# Patient Record
Sex: Female | Born: 1935
Health system: Southern US, Community
[De-identification: ages and names within clinical notes are randomized; demographics above are authoritative.]

## PROBLEM LIST (undated history)

## (undated) DIAGNOSIS — E7800 Pure hypercholesterolemia, unspecified: Secondary | ICD-10-CM

## (undated) DIAGNOSIS — F419 Anxiety disorder, unspecified: Secondary | ICD-10-CM

## (undated) DIAGNOSIS — F32A Depression, unspecified: Secondary | ICD-10-CM

## (undated) DIAGNOSIS — I10 Essential (primary) hypertension: Secondary | ICD-10-CM

## (undated) DIAGNOSIS — I739 Peripheral vascular disease, unspecified: Secondary | ICD-10-CM

## (undated) DIAGNOSIS — I639 Cerebral infarction, unspecified: Secondary | ICD-10-CM

## (undated) DIAGNOSIS — Z8673 Personal history of transient ischemic attack (TIA), and cerebral infarction without residual deficits: Secondary | ICD-10-CM

## (undated) DIAGNOSIS — D649 Anemia, unspecified: Secondary | ICD-10-CM

## (undated) DIAGNOSIS — C801 Malignant (primary) neoplasm, unspecified: Secondary | ICD-10-CM

## (undated) DIAGNOSIS — I5042 Chronic combined systolic (congestive) and diastolic (congestive) heart failure: Secondary | ICD-10-CM

## (undated) HISTORY — DX: Peripheral vascular disease, unspecified: I73.9

## (undated) HISTORY — DX: Malignant (primary) neoplasm, unspecified: C80.1

## (undated) HISTORY — PX: TOENAIL EXCISION: SUR558

## (undated) HISTORY — PX: MASTECTOMY: SHX3

## (undated) HISTORY — PX: ABDOMINAL HYSTERECTOMY: SHX81

## (undated) HISTORY — PX: FRACTURE SURGERY: SHX138

## (undated) HISTORY — PX: HUMERUS FRACTURE SURGERY: SHX670

## (undated) HISTORY — DX: Anemia, unspecified: D64.9

## (undated) HISTORY — DX: Personal history of transient ischemic attack (TIA), and cerebral infarction without residual deficits: Z86.73

## (undated) HISTORY — DX: Chronic combined systolic (congestive) and diastolic (congestive) heart failure: I50.42

---
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
## 1975-08-25 HISTORY — PX: NASAL SEPTUM SURGERY: SHX37

## 1975-08-25 HISTORY — PX: CALDWELL LUC SINUSOTOMY: SHX6292

## 1997-11-22 ENCOUNTER — Encounter: Admission: RE | Admit: 1997-11-22 | Discharge: 1998-02-20 | Payer: Self-pay

## 1999-06-02 ENCOUNTER — Ambulatory Visit (HOSPITAL_BASED_OUTPATIENT_CLINIC_OR_DEPARTMENT_OTHER): Admission: RE | Admit: 1999-06-02 | Discharge: 1999-06-02 | Payer: Self-pay | Admitting: Otolaryngology

## 1999-11-19 ENCOUNTER — Ambulatory Visit (HOSPITAL_COMMUNITY): Admission: RE | Admit: 1999-11-19 | Discharge: 1999-11-19 | Payer: Self-pay | Admitting: Gastroenterology

## 2001-08-24 HISTORY — PX: CHOLECYSTECTOMY: SHX55

## 2001-10-31 ENCOUNTER — Inpatient Hospital Stay (HOSPITAL_COMMUNITY): Admission: EM | Admit: 2001-10-31 | Discharge: 2001-11-05 | Payer: Self-pay | Admitting: Emergency Medicine

## 2001-11-03 ENCOUNTER — Encounter: Payer: Self-pay | Admitting: Internal Medicine

## 2001-12-20 ENCOUNTER — Ambulatory Visit (HOSPITAL_COMMUNITY): Admission: RE | Admit: 2001-12-20 | Discharge: 2001-12-20 | Payer: Self-pay | Admitting: General Surgery

## 2001-12-20 ENCOUNTER — Encounter: Payer: Self-pay | Admitting: General Surgery

## 2002-02-13 ENCOUNTER — Encounter: Payer: Self-pay | Admitting: General Surgery

## 2002-02-13 ENCOUNTER — Observation Stay (HOSPITAL_COMMUNITY): Admission: RE | Admit: 2002-02-13 | Discharge: 2002-02-14 | Payer: Self-pay | Admitting: General Surgery

## 2002-10-25 ENCOUNTER — Other Ambulatory Visit: Admission: RE | Admit: 2002-10-25 | Discharge: 2002-10-25 | Payer: Self-pay | Admitting: Family Medicine

## 2003-02-13 ENCOUNTER — Other Ambulatory Visit (HOSPITAL_COMMUNITY): Admission: RE | Admit: 2003-02-13 | Discharge: 2003-02-23 | Payer: Self-pay | Admitting: Psychiatry

## 2003-07-03 ENCOUNTER — Encounter: Admission: RE | Admit: 2003-07-03 | Discharge: 2003-07-03 | Payer: Self-pay | Admitting: *Deleted

## 2004-03-05 ENCOUNTER — Encounter: Admission: RE | Admit: 2004-03-05 | Discharge: 2004-03-05 | Payer: Self-pay | Admitting: Family Medicine

## 2004-09-24 ENCOUNTER — Encounter: Admission: RE | Admit: 2004-09-24 | Discharge: 2004-09-24 | Payer: Self-pay | Admitting: General Surgery

## 2004-09-26 ENCOUNTER — Ambulatory Visit (HOSPITAL_COMMUNITY): Admission: RE | Admit: 2004-09-26 | Discharge: 2004-09-26 | Payer: Self-pay | Admitting: General Surgery

## 2004-09-26 ENCOUNTER — Ambulatory Visit (HOSPITAL_BASED_OUTPATIENT_CLINIC_OR_DEPARTMENT_OTHER): Admission: RE | Admit: 2004-09-26 | Discharge: 2004-09-26 | Payer: Self-pay | Admitting: General Surgery

## 2006-08-27 ENCOUNTER — Encounter: Admission: RE | Admit: 2006-08-27 | Discharge: 2006-08-27 | Payer: Self-pay | Admitting: Family Medicine

## 2006-10-13 ENCOUNTER — Inpatient Hospital Stay (HOSPITAL_COMMUNITY): Admission: EM | Admit: 2006-10-13 | Discharge: 2006-10-14 | Payer: Self-pay | Admitting: Emergency Medicine

## 2007-01-31 ENCOUNTER — Other Ambulatory Visit: Admission: RE | Admit: 2007-01-31 | Discharge: 2007-01-31 | Payer: Self-pay | Admitting: Family Medicine

## 2007-08-25 HISTORY — PX: EYE SURGERY: SHX253

## 2011-01-09 NOTE — H&P (Signed)
NAMECATERIN, Helen [name] NO.:  [id number]   MEDICAL RECORD NO.:  [id number]          PATIENT TYPE:  INP   LOCATION:  5024                         FACILITY:  [facility]   PHYSICIAN:  Michelene Gardener, MD    DATE OF BIRTH:  1929/11/12   DATE OF ADMISSION:  10/13/2006  DATE OF DISCHARGE:                              HISTORY & PHYSICAL   PRIMARY CARE PHYSICIAN:  Lorre Nick .   CHIEF COMPLAINT:  The patient fell down and has pain in her elbow.   HISTORY OF PRESENT ILLNESS:  This is a 77 year old Caucasian female with  past medical history of multiple medical problems who presented with the  above mentioned complaint. This happened last night when the patient  tripped and fell on her left side. She had pain and decreased mobility  of her left elbow since that time. She was seen by her primary care  physician today and x-ray was done to her elbow, that showed dislocation  plus fracture of her left elbow. So the patient was sent for further  evaluation. Primary physician spoke to the orthopedic service, who will  be seeing the patient in the ER. Currently, the patient is complaining  of pain in her left elbow and decreased mobility of her elbow joint.   PAST MEDICAL HISTORY:  1. Diabetes mellitus.  2. Hypertension.  3. Hyperlipidemia.  4. GERD.  5. Depression.  6. Anxiety.  7. Chronic pain.   PAST SURGICAL HISTORY:  The patient has multiple surgical history. She  states that she has a list of her surgeries and I will re-dictated them  when the list is available.   ALLERGIES:  SULFA.   MEDICATIONS:  1. Norvasc 2.5 mg p.o. once daily.  2. Atenolol 50 mg p.o. once daily.  3. Zetia 10 mg p.o. once daily.  4. Folic acid 1 mg p.o. once daily.  5. Nasonex 2 sprays at bedtime.  6. Multivitamin 1 tab p.o. once daily.  7. Protonix 40 mg p.o. once daily.  8. Actos 50 mg p.o. daily.  9. Zoloft 75 mg p.o. daily.  10.Simvastatin 80 mg p.o. at bedtime.  11.Xanax 0.5 mg  p.o. q.8 hours p.r.n.   OBJECTIVE:  Denies smoking, denies alcohol drinking, denies recreational  drugs.   FAMILY HISTORY:  Hypertension and diabetes.   REVIEW OF SYSTEMS:  CONSTITUTIONAL:  Positive for stability. HEENT:  Eyes, no blurred vision. ENT:  No tinnitus. RESPIRATORY:  No cough, no  wheezes. CARDIOVASCULAR:  No chest pain, no shortness of breath.  GASTROINTESTINAL:  No mass, no rebound, no diarrhea. GENITOURINARY:  No  dysuria, no hematuria. ENDOCRINE:  No polyuria, no hematuria.  HEMATOLOGY:  No bleeding. INFECTIOUS DISEASE:  No rash and no lesions .  NEUROLOGIC:  No numbness or tingling. The rest of the systems are  reviewed and they are negative.   PHYSICAL EXAMINATION:  VITAL SIGNS:  Temperature 98.2, pulse 72,  respiratory rate 18, blood pressure 135/82.  GENERAL:  This is an elderly, Caucasian female, lying down in bed. Not  in acute distress.  HEENT:  Conjunctivae showed no pallor,  no erythema. Pupils are equal,  round, and reactive to light and accommodation. There is no ptosis seen  and intact. There is no discharge or bleeding. Oral mucosa is dry and no  pharyngeal erythema.  NECK:  Supple. No JVD. No carotid bruit. No tyroid enlargement.  CARDIOVASCULAR:  S1 and S2 regular. There are no murmur, rub, or gallop.  LUNGS:  The patient is breathing between 16 to 18. There is no use of  accessory muscles. No intercostal retractions. No rales, no rhonchi, and  no wheezes.  ABDOMEN:  Soft, nontender, and nondistended.  No hepatosplenomegaly.  Bowel sounds are normal.  BACK:  mild tenderness .  EXTREMITIES:  No edema. No varicose veins. Joints, the left elbow joint  is very tender to movement with decreased motion, currently in a sling.  SKIN:  No rash and no erythema.  NEUROLOGIC:  Cranial nerves 2-12 are intact. There are no motor or  sensory deficits.   LABORATORY DATA:  WBC 9.2. Hemoglobin and hematocrit 15.0 and 38.7. MCV  85.1. Platelet count is 198,000. INR  is 1.1. Sodium 129, potassium 4.3,  chloride 94, bicarb 25, glucose 128. BUN 9, creatinine 0.63.   IMPRESSION:  1. Preoperative evaluation for displaced elbow fracture dislocation.      This patient does not have any cardiac history. This fracture      occurred because she tripped and fell down and fractured her elbow.      There is no evidence of syncope and there is no evidence of      cardiopulmonary event. Denied chest pain and denied shortness of      breath. Her EKG showed sinus rhythm with no evidence of acute      ischemia. The patient will cleared for the surgery as a moderate      risk, given her age and her co-morbidities. The patient is already      getting beta blocker, which will decrease any possible cardiac      complications.  2. Diabetes mellitus. Will continue her current medications. I will      start her on insulin sliding scale. I will also get a hemoglobin      A1c to assess her control in the last 3 months.  3. Hypertension. Will continue her current medications and will follow      her blood pressure.  4. Hyperlipidemia. Will continue her current medications.  5. Rt elbow fracture dislocation. The patient will be seen by      orthopedics and will be taken today to the OR for closed reduction.   Total assessment time is 50 minutes.      Michelene Gardener, MD  Electronically Signed     NAE/MEDQ  D:  10/13/2006  T:  10/13/2006  Job:  [id number]   cc:   Donovan Kail, M.D.

## 2011-01-09 NOTE — Discharge Summary (Signed)
Helen, [name] NO.:  [id number]   MEDICAL RECORD NO.:  [id number]          PATIENT TYPE:  INP   LOCATION:  5024                         FACILITY:  [facility]   PHYSICIAN:  Michelene Gardener, MD    DATE OF BIRTH:  1930/03/14   DATE OF ADMISSION:  10/13/2006  DATE OF DISCHARGE:  10/14/2006                               DISCHARGE SUMMARY   DISCHARGE DIAGNOSES:  1. Right elbow fracture dislocation.  2. Diabetes mellitus.  3. Hypertension.  4. Hyperlipidemia.  5. Gastroesophageal reflux disease.  6. Depression.  7. Anxiety.  8. Chronic pain.   DISCHARGE MEDICATIONS:  1. Norvasc 2.5 mg p.o. once daily.  2. Atenolol 50 mg p.o. once daily.  3. Zetia 10 mg p.o. once daily.  4. Folic acid 1 mg p.o. once daily.  5. Nasonex  two sprays at bedtime.  6. Multivitamin 1 tab p.o. once daily.  7. Protonix 40 mg p.o. once daily.  8. Actos 15 mg p.o. once daily.  9. Zoloft 75 mg p.o. once daily.  10.Simvastatin 80 mg p.o. at bedtime.  11.Xanax 0.5 p.o. q.8 h. p.r.n.   CONSULTATIONS:  Orthopedic consult by Dr. Dairl Ponder.   PROCEDURE:  Closed reduction of right elbow fracture dislocation.   FOLLOWUP:  Appointment with primary doctor in one to two weeks.   RADIOLOGY STUDIES:  1. Her right elbow x-ray showed fracture dislocation in the right      elbow.  2. Chest x-ray showed right upper lobe scarring with no acute      abnormalities.   COURSE OF HOSPITALIZATION AND MEDICAL PROBLEMS:  This is a 77 year old  female who is right hand dominant, fell one day prior to her  presentation after she lost her balance, and she hit her right elbow.  X-  rays done in her primary physician's office showed the fracture  dislocation, and the patient was sent to the ER for further evaluation.  The patient was admitted to the hospital for closed reduction, was seen  by orthopedics where closed reduction was done in the OR.  The patient  was kept overnight for observation.  She  remains stable.  Next day, she  was discharged home on the same medications taken at home, in addition  to pain medications.  The patient remained stable during her  hospitalization.  Will be followed by her primary physician in one week.   Care of this patient was 40 minutes.      Michelene Gardener, MD  Electronically Signed     NAE/MEDQ  D:  10/14/2006  T:  10/15/2006  Job:  [id number]   cc:   Lorre Nick, M.D.

## 2011-01-09 NOTE — Consult Note (Signed)
NAMETAMKIA, TEMPLES NO.:  [id number]   MEDICAL RECORD NO.:  [id number]          PATIENT TYPE:  INP   LOCATION:  5024                         FACILITY:  [facility]   PHYSICIAN:  Artist Pais. Weingold, M.D.DATE OF BIRTH:  [birth year]   DATE OF CONSULTATION:  10/13/2006  DATE OF DISCHARGE:                                 CONSULTATION   PHYSICIAN REQUESTING CONSULTATION:  Dr. Lorre Nick.   REASON FOR CONSULTATION:  Helen [name] is a 77 year old, right-  hand dominant female who presents today status post a fall yesterday  evening with a painful and swollen dominant right elbow.  X-rays taken  in her private doctors office showed a possible fracture dislocation,  and she was referred here for definitive care.  She is 77 years old.  She has an allergy to sulfa drugs.  She is currently on Actos, Norvasc,  atenolol, Nasonex, Zoloft, Prevacid, Lipitor, aspirin, and sonata, as  well as Zocor.  She also takes vitamins.  Again, she fell yesterday  evening.   PAST MEDICAL HISTORY:  Significant for 8 pregnancies in the past.  Mitral valve prolapse.   ALLERGIES:  Sulfa and morphine.   No recent hospitalizations or surgeries.   FAMILY MEDICAL HISTORY:  Noncontributory.   SOCIAL HISTORY:  Noncontributory.   EXAMINATION:  She has a swollen and tender right elbow.  She has  subjective numbness and tingling in the hand, throughout the entire hand  and digits.  No one neuro distribution is evident.  She has complete  radial nerve function, including wrist, thumb, and extensors, although  it is painful.  Median ulnar does appear to be intact as well.  Skin is  intact.  Again, range of motion is limited secondary to her pain and  swelling.  X-rays show a fracture dislocation with a probable lateral  condylar fracture and a posterior lateral elbow dislocation.   HISTORY OF PRESENT ILLNESS:  A 77 year old female with a posterior  lateral fracture dislocation of her dominant  right elbow.  Recommendations at this point in time, she will be admitted to the  medicine service, taken to the operating room for closed reduction and  possible ORIF of this condylar fragment versus excision.  If it is a  small fragment, which we attach to the extensor mechanism.  She will be  admitted to the medicine service and again, will be followed and  hopefully discharged in 23 hours.      Artist Pais Mina Marble, M.D.  Electronically Signed     MAW/MEDQ  D:  10/13/2006  T:  10/14/2006  Job:  [id number]

## 2011-01-09 NOTE — Op Note (Signed)
Helen [name], Helen [name] NO.:  [id number]   MEDICAL RECORD NO.:  [id number]          PATIENT TYPE:  INP   LOCATION:  5024                         FACILITY:  [facility]   PHYSICIAN:  Artist Pais. Weingold, M.D.DATE OF BIRTH:  01/09/1930   DATE OF PROCEDURE:  10/13/2006  DATE OF DISCHARGE:                               OPERATIVE REPORT   PREOPERATIVE DIAGNOSIS:  Fracture-dislocation, right elbow.   POSTOPERATIVE DIAGNOSIS:  Fracture-dislocation, right elbow.   PROCEDURE:  Open reduction, internal fixation, with fixation of large  lateral condylar fragment with two 3.2-mm cannulated screws, one 24 mm  and one 30 mm.   SURGEON:  Artist Pais. Mina Marble, M.D.   ASSISTANT:  None.   ANESTHESIA:  General.   TOURNIQUET TIME:  An  hour.   COMPLICATIONS:  No complications.   DRAINS:  No drains.   OPERATIVE REPORT:  The patient was taken to the operating room suite.  After the induction of adequate general anesthesia, the right upper  extremity was prepped and draped in sterile fashion.  An Esmarch bandage  was used to exsanguinate the limb.  The tourniquet was inflated to 250  mmHg.  At this point in time, a closed reduction was attempted, but  there was significant difficulty, and image intensification revealed  displacement of a large lateral condylar fragment perched inside the  joint.  A Kocher approach was then undertaken to the lateral side of the  elbow.  Dissection was carried down through the skin and subcutaneous  tissues.  The interval between the common extensors, which was torn off  secondary to the fracture-dislocation, was developed.  A large amount of  blood was evacuated from the joint.  The lateral condylar fragment was  carefully removed from the joint.  The joint was reduced and irrigated  out.  There were no other articular fragments noted.  The lateral  condylar fragment was then fixed with two 3.2-mm cannulated screws from  lateral to medial, from  anterior to posterior, under direct and  fluoroscopic guidance.  One was 30 mm in length; one was 24 mm in  length.  Intraoperative inspection revealed no threads inside the  olecranon fossa or anteriorly.  The wound was then irrigated.  It was  loosely closed with 0 Vicryl  to close the muscle layer and 2-0 Vicryl subcutaneously and staples on  the skin.  Xeroform, 4 x 4's, fluffs and a posterior splint with the  elbow flexed to 90 degrees and the forearm in neutral rotation were  applied.  The patient tolerated the procedure well and went to the  recovery room in stable fashion.      Artist Pais Mina Marble, M.D.  Electronically Signed     MAW/MEDQ  D:  10/13/2006  T:  10/14/2006  Job:  [id number]

## 2011-06-17 ENCOUNTER — Encounter: Payer: Private Health Insurance - Indemnity | Admitting: Oncology

## 2011-07-01 ENCOUNTER — Ambulatory Visit (HOSPITAL_COMMUNITY)
Admission: RE | Admit: 2011-07-01 | Discharge: 2011-07-01 | Disposition: A | Discharge status: Home / Self Care | Payer: Medicare Other | Source: Ambulatory Visit | Attending: Oncology | Admitting: Oncology

## 2011-07-01 ENCOUNTER — Other Ambulatory Visit: Payer: Self-pay | Admitting: Oncology

## 2011-07-01 ENCOUNTER — Ambulatory Visit (HOSPITAL_COMMUNITY): Payer: Self-pay

## 2011-07-01 DIAGNOSIS — I059 Rheumatic mitral valve disease, unspecified: Secondary | ICD-10-CM | POA: Insufficient documentation

## 2011-07-01 DIAGNOSIS — I10 Essential (primary) hypertension: Secondary | ICD-10-CM | POA: Insufficient documentation

## 2011-07-01 DIAGNOSIS — C50919 Malignant neoplasm of unspecified site of unspecified female breast: Secondary | ICD-10-CM

## 2011-07-01 DIAGNOSIS — I079 Rheumatic tricuspid valve disease, unspecified: Secondary | ICD-10-CM | POA: Insufficient documentation

## 2011-07-01 DIAGNOSIS — Z09 Encounter for follow-up examination after completed treatment for conditions other than malignant neoplasm: Secondary | ICD-10-CM

## 2011-07-01 DIAGNOSIS — E119 Type 2 diabetes mellitus without complications: Secondary | ICD-10-CM | POA: Insufficient documentation

## 2011-07-01 NOTE — Progress Notes (Signed)
  Echocardiogram 2D Echocardiogram has been performed.  Juanita Laster Alexi Dorminey, RDCS 07/01/2011, 3:52 PM

## 2011-07-27 ENCOUNTER — Ambulatory Visit (HOSPITAL_BASED_OUTPATIENT_CLINIC_OR_DEPARTMENT_OTHER): Payer: Medicare Other | Admitting: Oncology

## 2011-07-27 ENCOUNTER — Ambulatory Visit: Payer: Medicare Other

## 2011-07-27 VITALS — BP 145/78 | HR 69 | Temp 97.8°F | Ht 63.0 in | Wt 167.1 lb

## 2011-07-27 DIAGNOSIS — R5383 Other fatigue: Secondary | ICD-10-CM

## 2011-07-27 DIAGNOSIS — G4700 Insomnia, unspecified: Secondary | ICD-10-CM

## 2011-07-27 DIAGNOSIS — Z853 Personal history of malignant neoplasm of breast: Secondary | ICD-10-CM

## 2011-07-27 DIAGNOSIS — F329 Major depressive disorder, single episode, unspecified: Secondary | ICD-10-CM

## 2011-07-27 DIAGNOSIS — C50919 Malignant neoplasm of unspecified site of unspecified female breast: Secondary | ICD-10-CM

## 2011-07-27 NOTE — Progress Notes (Signed)
CC: Helen [name]    HPI: Helen [name] is a 77 year old [place] woman with a remote history of breast cancer, referred by Dr. Tenny [name] for evaluation of persistent unexplained fatigue.  The patient underwent right mastectomy and axillary lymph node dissection in 1987 for what by her account was a T11b N2 (Stage III) breast cancer, treated adjuvantly with chemotherapy (apparently Cytoxan fluorouracil and doxorubicin) followed by radiation. A year later, the patient had a left mastectomy for what by her account seems to have been DCIS. She was followed for many years by her primary oncologist, Dr. Lorenda [name], but for the past decade or so has been seeing only her primary care physician.   More recently she has become concerned that she gets tired with mild to moderate activity. Dr. Tenny [name] has evaluated this with targeted lab work as detailed below, without coming up with a specific cause. He requested we evaluate Helen [name] for the possibility of a postchemotherapy fatigue syndrome or other explanation of the patient's fatigue related to her prior cancer.  Past medical history: The patient is status post multiple sinus surgeries; she is status post hysterectomy without salpingo-oophorectomy; 2 status post bilateral mastectomies as noted above she is status post cholecystectomy she is status post bilateral cataract surgery she has a history of right humerus fracture and repair. Other medical problems include diabetes, hypertension, irritable bowel, history of what sounds like early retinal detachment history of venous insufficiency, history of proteinuria, history of right ocular migraines, history of mitral valve prolapse, history of appendectomy  Family history the patient's father died at the age of 33 from heart disease; the patient's mother died at the age of 90 from a pulmonary embolus. The patient's mother had been diagnosed with breast cancer in her 42s. The patient has no sisters. She had 2 brothers there  is no other breast or ovarian cancer in the family to her knowledge.  Gynecologic history:  She is GX P8, pregnancy to term age 66. She never took hormone replacement.  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 Her husband used to work for [employer] and also of was a member of the [employer]. He died 4 years ago. The patient has a daughter in [place], and other in [place], and other in [place], and other daughters in [place] and [place]. She has sons in [place] and [place] and [place]. She has 17 grandchildren. I should note the patient recently lost a son-in-law to suicide, another son-in-law to natural causes, and there have been 2 other recent deaths in the family. She moved not long ago to a retirement community where she has an independent apartment.  Health maintenance:  The patient  does not have a smoking history on file. She does not have any smokeless tobacco history on file. Her alcohol and drug histories not on file.   Cholesterol   Bone density   Colonoscopy 2010  (PAP)   Allergies: Sulfa and morphine     Medications: Aspirin, alprazolam, ramipril, atenolol, Nasonex, omeprazole, simvastatin, Zetia, Actos, and hydrochlorothiazide. She also takes a variety of vitamins including B12 folate acid calcium vitamin D selenium vitamin C high potency B complex and a multivitamin.     ROS  She tells me she walks around the new complex but she cannot really tell me in a quantitative matter how long she walks 4 or how far she walks. In addition to fatigue, she has pain in both feet  which limit her activity. She used to enjoy going to water aerobics, but now it would be difficult for her because the pool in the complex where she lives is closed. She does not sleep well, frequently waking up about 4 in the morning. She also reports difficulty going to sleep because "my mind just keeps going especially about my children".  Her vision is sometimes blurred; sometimes her voice is hoarse; sometimes she has a bit of a dry cough and sinus problems. She has a history of urinary tract infections, arthritis and  occasional hot flashes. She tells me that she has lost interest in social interactions. She denies depression.   Physical Exam: Helen [name] is an elderly white woman who appears in no acute distress   Blood pressure 145/78, pulse 69, temperature 97.8 F (36.6 C), height 5\' 3"  (1.6 m), weight 167 lb 1.6 oz (75.796 kg).  Sclerae unicteric Oropharynx clear No peripheral adenopathy Lungs no rales or rhonchi Heart regular rate and rhythm Abd benign MSK no focal spinal tenderness, no peripheral edema Neuro: nonfocal Breasts: status post bilateral mastectomies no evidence of local recurrence  LABS   Labs obtained by Dr. Tenny [name] included a creatinine of 0.73, glucose of 85, normal potassium calcium and sodium, hemoglobin of 14.4, white cell count of 6.6 with an unremarkable differential, MCV 89.2 and platelets 230,000. TSH was 1.71. CA 27-29 was 18.4. Hemoglobin A1c was 6.2.     FILMS:  we obtained a chest x-ray which shows no evidence of metastatic disease. An echocardiogram showed a well preserved ejection fraction with minimal mitral insufficiency.    Assessment:  77 year old [place] woman status post right mastectomy in 1987 for a stage III breast carcinoma treated adjuvantly with radiation and 6 months of chemotherapy including anthracyclines; status post left mastectomy in 1988 for what sounds like ductal carcinoma in situ.    Plan:  we discussed the many causes of fatigue and first of all we certainly do not see any evidence of disease recurrence. I also do not find evidence of doxorubicin cardiotoxicity. Moving away from the cancer issue, there is no evidence of hypothyroidism, anemia, or an inflammatory disease by history, physical and labs. This leads [place] to the more common causes of fatigue, which include  depression, insomnia, and deconditioning.  I think she suffers from all 3 of these. I have recommended that she take Benadryl at bedtime and I also wrote her a prescription for amitriptyline 25 mg to take at bedtime. I hope this will help both the insomnia and the anxious thoughts that she says keep her from sleeping. I also strongly urged her to exercise 4 times a day, whether by doing water aerobics, or a stationary bike, or even "chair exercises". If she purchased a pedometer she would be able to tell just how much she walks every day and that would be helpful to now.  I am hopeful that these simple measures she will fine her functional activity improves. 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 The treatment for this of course largely would consist of exercise. We will see how far she has advanced by the time she returns to see me in February.  Cristie Mckinney C 07/27/2011, 3:24 PM

## 2011-09-03 DIAGNOSIS — H40019 Open angle with borderline findings, low risk, unspecified eye: Secondary | ICD-10-CM | POA: Diagnosis not present

## 2011-09-08 DIAGNOSIS — E119 Type 2 diabetes mellitus without complications: Secondary | ICD-10-CM | POA: Diagnosis not present

## 2011-09-08 DIAGNOSIS — L608 Other nail disorders: Secondary | ICD-10-CM | POA: Diagnosis not present

## 2011-09-08 DIAGNOSIS — L84 Corns and callosities: Secondary | ICD-10-CM | POA: Diagnosis not present

## 2011-10-02 DIAGNOSIS — I10 Essential (primary) hypertension: Secondary | ICD-10-CM | POA: Diagnosis not present

## 2011-10-02 DIAGNOSIS — E782 Mixed hyperlipidemia: Secondary | ICD-10-CM | POA: Diagnosis not present

## 2011-10-02 DIAGNOSIS — F411 Generalized anxiety disorder: Secondary | ICD-10-CM | POA: Diagnosis not present

## 2011-10-02 DIAGNOSIS — E1129 Type 2 diabetes mellitus with other diabetic kidney complication: Secondary | ICD-10-CM | POA: Diagnosis not present

## 2011-10-03 ENCOUNTER — Emergency Department (INDEPENDENT_AMBULATORY_CARE_PROVIDER_SITE_OTHER)
Admission: EM | Admit: 2011-10-03 | Discharge: 2011-10-03 | Disposition: A | Discharge status: Home / Self Care | Payer: Medicare Other | Source: Home / Self Care | Attending: Family Medicine | Admitting: Family Medicine

## 2011-10-03 ENCOUNTER — Encounter (HOSPITAL_COMMUNITY): Payer: Self-pay

## 2011-10-03 DIAGNOSIS — E871 Hypo-osmolality and hyponatremia: Secondary | ICD-10-CM

## 2011-10-03 HISTORY — DX: Essential (primary) hypertension: I10

## 2011-10-03 HISTORY — DX: Pure hypercholesterolemia, unspecified: E78.00

## 2011-10-03 LAB — POCT I-STAT, CHEM 8
BUN: 15 mg/dL (ref 6–23)
Calcium, Ion: 1.16 mmol/L (ref 1.12–1.32)
Chloride: 89 mEq/L — ABNORMAL LOW (ref 96–112)
Creatinine, Ser: 0.9 mg/dL (ref 0.50–1.10)
Glucose, Bld: 105 mg/dL — ABNORMAL HIGH (ref 70–99)
HCT: 38 % (ref 36.0–46.0)
Hemoglobin: 12.9 g/dL (ref 12.0–15.0)
Potassium: 4.6 mEq/L (ref 3.5–5.1)
Sodium: 128 mEq/L — ABNORMAL LOW (ref 135–145)
TCO2: 29 mmol/L (ref 0–100)

## 2011-10-03 NOTE — ED Notes (Signed)
Pt was seen by her PCP yesterday and was called last pm and told to come here for recheck of her sodium that was 125 yesterday.

## 2011-10-03 NOTE — ED Provider Notes (Signed)
History     CSN: [id number]  Arrival date & time 10/03/11  1003   First MD Initiated Contact with Patient 10/03/11 1113      Chief Complaint  Patient presents with  . Labs Only    (Consider location/radiation/quality/duration/timing/severity/associated sxs/prior treatment) HPI Comments: Told to come for recheck of sodium level that was 125 yest., pt with no sx.    Past Medical History  Diagnosis Date  . Hypercholesteremia   . Hypertension   . Diabetes mellitus     Past Surgical History  Procedure Date  . Appendectomy   . Abdominal hysterectomy   . Mastectomy   . Humerus fracture surgery     History reviewed. No pertinent family history.  History  Substance Use Topics  . Smoking status: Never Smoker   . Smokeless tobacco: Not on file  . Alcohol Use: No    OB History    Grav Para Term Preterm Abortions TAB SAB Ect Mult Living                  Review of Systems  Constitutional: Negative.   Respiratory: Negative.   Cardiovascular: Negative.     Allergies  Morphine and related and Sulfa antibiotics  Home Medications   Current Outpatient Rx  Name Route Sig Dispense Refill  . ALPRAZOLAM 0.5 MG PO TABS Oral Take 0.5 mg by mouth at bedtime as needed.    [name] AMITRIPTYLINE HCL 25 MG PO TABS Oral Take 25 mg by mouth at bedtime.    . ASPIRIN 81 MG PO TABS Oral Take 160 mg by mouth daily.    . ATENOLOL 50 MG PO TABS Oral Take 50 mg by mouth daily.    [name] EZETIMIBE 10 MG PO TABS Oral Take 10 mg by mouth daily.    [name] HYDROCHLOROTHIAZIDE 25 MG PO TABS Oral Take 25 mg by mouth daily.    . MOMETASONE FUROATE 50 MCG/ACT NA SUSP Nasal Place 2 sprays into the nose daily.    [name] OMEPRAZOLE 20 MG PO CPDR Oral Take 20 mg by mouth daily.    [name] RAMIPRIL 10 MG PO CAPS Oral Take by mouth daily.    [name] SIMVASTATIN 40 MG PO TABS Oral Take 40 mg by mouth every evening.      BP 162/82  Pulse 63  Temp(Src) 97.9 F (36.6 C) (Oral)  Resp 16  Physical Exam  Nursing note and vitals  reviewed. Constitutional: She is oriented to person, place, and time. She appears well-developed and well-nourished.  Cardiovascular: Normal rate, normal heart sounds and intact distal pulses.   Pulmonary/Chest: Effort normal and breath sounds normal.  Musculoskeletal: She exhibits no edema.  Neurological: She is alert and oriented to person, place, and time.  Skin: Skin is warm and dry.  Psychiatric: She has a normal mood and affect.    ED Course  Procedures (including critical care time)  Labs Reviewed  POCT I-STAT, CHEM 8 - Abnormal; Notable for the following:    Sodium 128 (*)    Chloride 89 (*)    Glucose, Bld 105 (*)    All other components within normal limits   No results found.   1. Chronic hyponatremia       MDM  Na 128 ,  Cl [address]        Barkley Bruns, MD 10/03/11 1202

## 2011-10-05 DIAGNOSIS — E871 Hypo-osmolality and hyponatremia: Secondary | ICD-10-CM | POA: Diagnosis not present

## 2011-10-06 ENCOUNTER — Telehealth: Payer: Self-pay | Admitting: Oncology

## 2011-10-06 DIAGNOSIS — E782 Mixed hyperlipidemia: Secondary | ICD-10-CM | POA: Diagnosis not present

## 2011-10-06 DIAGNOSIS — I10 Essential (primary) hypertension: Secondary | ICD-10-CM | POA: Diagnosis not present

## 2011-10-06 DIAGNOSIS — E871 Hypo-osmolality and hyponatremia: Secondary | ICD-10-CM | POA: Diagnosis not present

## 2011-10-06 DIAGNOSIS — E1129 Type 2 diabetes mellitus with other diabetic kidney complication: Secondary | ICD-10-CM | POA: Diagnosis not present

## 2011-10-06 NOTE — Telephone Encounter (Signed)
Pt called to cancel all of her feb appts until further notice. Pt stated that she had some labs drawn with her primary md and eveything is fine and if she needs [place] she will call abck to r/s

## 2011-10-12 ENCOUNTER — Other Ambulatory Visit: Payer: Private Health Insurance - Indemnity | Admitting: Lab

## 2011-10-12 ENCOUNTER — Ambulatory Visit: Payer: Private Health Insurance - Indemnity | Admitting: Oncology

## 2011-10-14 DIAGNOSIS — E871 Hypo-osmolality and hyponatremia: Secondary | ICD-10-CM | POA: Diagnosis not present

## 2011-10-14 DIAGNOSIS — E1129 Type 2 diabetes mellitus with other diabetic kidney complication: Secondary | ICD-10-CM | POA: Diagnosis not present

## 2011-10-14 DIAGNOSIS — E782 Mixed hyperlipidemia: Secondary | ICD-10-CM | POA: Diagnosis not present

## 2011-10-14 DIAGNOSIS — I10 Essential (primary) hypertension: Secondary | ICD-10-CM | POA: Diagnosis not present

## 2011-10-20 DIAGNOSIS — J331 Polypoid sinus degeneration: Secondary | ICD-10-CM | POA: Diagnosis not present

## 2011-10-20 DIAGNOSIS — J310 Chronic rhinitis: Secondary | ICD-10-CM | POA: Diagnosis not present

## 2011-12-15 DIAGNOSIS — L608 Other nail disorders: Secondary | ICD-10-CM | POA: Diagnosis not present

## 2011-12-15 DIAGNOSIS — E119 Type 2 diabetes mellitus without complications: Secondary | ICD-10-CM | POA: Diagnosis not present

## 2012-01-06 DIAGNOSIS — F411 Generalized anxiety disorder: Secondary | ICD-10-CM | POA: Diagnosis not present

## 2012-01-06 DIAGNOSIS — E871 Hypo-osmolality and hyponatremia: Secondary | ICD-10-CM | POA: Diagnosis not present

## 2012-01-06 DIAGNOSIS — E1129 Type 2 diabetes mellitus with other diabetic kidney complication: Secondary | ICD-10-CM | POA: Diagnosis not present

## 2012-01-06 DIAGNOSIS — I10 Essential (primary) hypertension: Secondary | ICD-10-CM | POA: Diagnosis not present

## 2012-01-06 DIAGNOSIS — E782 Mixed hyperlipidemia: Secondary | ICD-10-CM | POA: Diagnosis not present

## 2012-01-06 DIAGNOSIS — E663 Overweight: Secondary | ICD-10-CM | POA: Diagnosis not present

## 2012-02-09 DIAGNOSIS — E871 Hypo-osmolality and hyponatremia: Secondary | ICD-10-CM | POA: Diagnosis not present

## 2012-03-01 DIAGNOSIS — L608 Other nail disorders: Secondary | ICD-10-CM | POA: Diagnosis not present

## 2012-03-01 DIAGNOSIS — E119 Type 2 diabetes mellitus without complications: Secondary | ICD-10-CM | POA: Diagnosis not present

## 2012-03-31 DIAGNOSIS — S6000XA Contusion of unspecified finger without damage to nail, initial encounter: Secondary | ICD-10-CM | POA: Diagnosis not present

## 2012-04-18 DIAGNOSIS — I10 Essential (primary) hypertension: Secondary | ICD-10-CM | POA: Diagnosis not present

## 2012-04-18 DIAGNOSIS — E782 Mixed hyperlipidemia: Secondary | ICD-10-CM | POA: Diagnosis not present

## 2012-04-18 DIAGNOSIS — E1129 Type 2 diabetes mellitus with other diabetic kidney complication: Secondary | ICD-10-CM | POA: Diagnosis not present

## 2012-04-18 DIAGNOSIS — F411 Generalized anxiety disorder: Secondary | ICD-10-CM | POA: Diagnosis not present

## 2012-04-18 DIAGNOSIS — K219 Gastro-esophageal reflux disease without esophagitis: Secondary | ICD-10-CM | POA: Diagnosis not present

## 2012-04-29 ENCOUNTER — Other Ambulatory Visit: Payer: Self-pay | Admitting: Gastroenterology

## 2012-04-29 DIAGNOSIS — Z86010 Personal history of colonic polyps: Secondary | ICD-10-CM | POA: Diagnosis not present

## 2012-04-29 DIAGNOSIS — D129 Benign neoplasm of anus and anal canal: Secondary | ICD-10-CM | POA: Diagnosis not present

## 2012-04-29 DIAGNOSIS — Z09 Encounter for follow-up examination after completed treatment for conditions other than malignant neoplasm: Secondary | ICD-10-CM | POA: Diagnosis not present

## 2012-04-29 DIAGNOSIS — D128 Benign neoplasm of rectum: Secondary | ICD-10-CM | POA: Diagnosis not present

## 2012-04-29 DIAGNOSIS — D126 Benign neoplasm of colon, unspecified: Secondary | ICD-10-CM | POA: Diagnosis not present

## 2012-05-03 DIAGNOSIS — L84 Corns and callosities: Secondary | ICD-10-CM | POA: Diagnosis not present

## 2012-05-03 DIAGNOSIS — Q6689 Other  specified congenital deformities of feet: Secondary | ICD-10-CM | POA: Diagnosis not present

## 2012-05-03 DIAGNOSIS — L608 Other nail disorders: Secondary | ICD-10-CM | POA: Diagnosis not present

## 2012-05-03 DIAGNOSIS — E119 Type 2 diabetes mellitus without complications: Secondary | ICD-10-CM | POA: Diagnosis not present

## 2012-05-17 DIAGNOSIS — R51 Headache: Secondary | ICD-10-CM | POA: Diagnosis not present

## 2012-05-17 DIAGNOSIS — Z23 Encounter for immunization: Secondary | ICD-10-CM | POA: Diagnosis not present

## 2012-07-07 DIAGNOSIS — H26499 Other secondary cataract, unspecified eye: Secondary | ICD-10-CM | POA: Diagnosis not present

## 2012-07-26 DIAGNOSIS — K219 Gastro-esophageal reflux disease without esophagitis: Secondary | ICD-10-CM | POA: Diagnosis not present

## 2012-07-26 DIAGNOSIS — I10 Essential (primary) hypertension: Secondary | ICD-10-CM | POA: Diagnosis not present

## 2012-07-26 DIAGNOSIS — R4701 Aphasia: Secondary | ICD-10-CM | POA: Diagnosis not present

## 2012-07-26 DIAGNOSIS — E782 Mixed hyperlipidemia: Secondary | ICD-10-CM | POA: Diagnosis not present

## 2012-07-26 DIAGNOSIS — R413 Other amnesia: Secondary | ICD-10-CM | POA: Diagnosis not present

## 2012-07-26 DIAGNOSIS — E1129 Type 2 diabetes mellitus with other diabetic kidney complication: Secondary | ICD-10-CM | POA: Diagnosis not present

## 2012-07-26 DIAGNOSIS — F411 Generalized anxiety disorder: Secondary | ICD-10-CM | POA: Diagnosis not present

## 2012-07-27 ENCOUNTER — Other Ambulatory Visit: Payer: Self-pay | Admitting: Family Medicine

## 2012-07-27 DIAGNOSIS — R4701 Aphasia: Secondary | ICD-10-CM

## 2012-08-02 DIAGNOSIS — L608 Other nail disorders: Secondary | ICD-10-CM | POA: Diagnosis not present

## 2012-08-02 DIAGNOSIS — E119 Type 2 diabetes mellitus without complications: Secondary | ICD-10-CM | POA: Diagnosis not present

## 2012-08-04 ENCOUNTER — Ambulatory Visit
Admission: RE | Admit: 2012-08-04 | Discharge: 2012-08-04 | Disposition: A | Discharge status: Home / Self Care | Payer: Medicare Other | Source: Ambulatory Visit | Attending: Family Medicine | Admitting: Family Medicine

## 2012-08-04 ENCOUNTER — Ambulatory Visit
Admission: RE | Admit: 2012-08-04 | Discharge: 2012-08-04 | Disposition: A | Discharge status: Home / Self Care | Payer: Private Health Insurance - Indemnity | Source: Ambulatory Visit | Attending: Family Medicine | Admitting: Family Medicine

## 2012-08-04 DIAGNOSIS — I6350 Cerebral infarction due to unspecified occlusion or stenosis of unspecified cerebral artery: Secondary | ICD-10-CM | POA: Diagnosis not present

## 2012-08-04 DIAGNOSIS — I658 Occlusion and stenosis of other precerebral arteries: Secondary | ICD-10-CM | POA: Diagnosis not present

## 2012-08-04 DIAGNOSIS — R4701 Aphasia: Secondary | ICD-10-CM

## 2012-08-11 DIAGNOSIS — I6350 Cerebral infarction due to unspecified occlusion or stenosis of unspecified cerebral artery: Secondary | ICD-10-CM | POA: Diagnosis not present

## 2012-08-25 DIAGNOSIS — E782 Mixed hyperlipidemia: Secondary | ICD-10-CM | POA: Diagnosis not present

## 2012-08-25 DIAGNOSIS — I10 Essential (primary) hypertension: Secondary | ICD-10-CM | POA: Diagnosis not present

## 2012-08-25 DIAGNOSIS — I6350 Cerebral infarction due to unspecified occlusion or stenosis of unspecified cerebral artery: Secondary | ICD-10-CM | POA: Diagnosis not present

## 2012-09-03 ENCOUNTER — Other Ambulatory Visit: Payer: Self-pay | Admitting: Oncology

## 2012-11-04 DIAGNOSIS — L608 Other nail disorders: Secondary | ICD-10-CM | POA: Diagnosis not present

## 2012-11-04 DIAGNOSIS — E119 Type 2 diabetes mellitus without complications: Secondary | ICD-10-CM | POA: Diagnosis not present

## 2012-11-04 DIAGNOSIS — L84 Corns and callosities: Secondary | ICD-10-CM | POA: Diagnosis not present

## 2012-11-24 DIAGNOSIS — Z86010 Personal history of colonic polyps: Secondary | ICD-10-CM | POA: Diagnosis not present

## 2012-11-24 DIAGNOSIS — E1129 Type 2 diabetes mellitus with other diabetic kidney complication: Secondary | ICD-10-CM | POA: Diagnosis not present

## 2012-11-24 DIAGNOSIS — E782 Mixed hyperlipidemia: Secondary | ICD-10-CM | POA: Diagnosis not present

## 2012-11-24 DIAGNOSIS — C50919 Malignant neoplasm of unspecified site of unspecified female breast: Secondary | ICD-10-CM | POA: Diagnosis not present

## 2012-11-24 DIAGNOSIS — I10 Essential (primary) hypertension: Secondary | ICD-10-CM | POA: Diagnosis not present

## 2012-11-24 DIAGNOSIS — F411 Generalized anxiety disorder: Secondary | ICD-10-CM | POA: Diagnosis not present

## 2012-11-24 DIAGNOSIS — I6350 Cerebral infarction due to unspecified occlusion or stenosis of unspecified cerebral artery: Secondary | ICD-10-CM | POA: Diagnosis not present

## 2012-12-06 DIAGNOSIS — Z23 Encounter for immunization: Secondary | ICD-10-CM | POA: Diagnosis not present

## 2013-01-02 DIAGNOSIS — R197 Diarrhea, unspecified: Secondary | ICD-10-CM | POA: Diagnosis not present

## 2013-01-17 DIAGNOSIS — M899 Disorder of bone, unspecified: Secondary | ICD-10-CM | POA: Diagnosis not present

## 2013-01-17 DIAGNOSIS — M949 Disorder of cartilage, unspecified: Secondary | ICD-10-CM | POA: Diagnosis not present

## 2013-01-17 DIAGNOSIS — Z780 Asymptomatic menopausal state: Secondary | ICD-10-CM | POA: Diagnosis not present

## 2013-01-18 DIAGNOSIS — H26499 Other secondary cataract, unspecified eye: Secondary | ICD-10-CM | POA: Diagnosis not present

## 2013-01-24 DIAGNOSIS — E119 Type 2 diabetes mellitus without complications: Secondary | ICD-10-CM | POA: Diagnosis not present

## 2013-01-24 DIAGNOSIS — L608 Other nail disorders: Secondary | ICD-10-CM | POA: Diagnosis not present

## 2013-01-24 DIAGNOSIS — L84 Corns and callosities: Secondary | ICD-10-CM | POA: Diagnosis not present

## 2013-02-05 DIAGNOSIS — N390 Urinary tract infection, site not specified: Secondary | ICD-10-CM | POA: Diagnosis not present

## 2013-03-12 DIAGNOSIS — L538 Other specified erythematous conditions: Secondary | ICD-10-CM | POA: Diagnosis not present

## 2013-03-12 DIAGNOSIS — B029 Zoster without complications: Secondary | ICD-10-CM | POA: Diagnosis not present

## 2013-04-25 DIAGNOSIS — E782 Mixed hyperlipidemia: Secondary | ICD-10-CM | POA: Diagnosis not present

## 2013-04-25 DIAGNOSIS — E1129 Type 2 diabetes mellitus with other diabetic kidney complication: Secondary | ICD-10-CM | POA: Diagnosis not present

## 2013-04-25 DIAGNOSIS — Z23 Encounter for immunization: Secondary | ICD-10-CM | POA: Diagnosis not present

## 2013-04-25 DIAGNOSIS — F411 Generalized anxiety disorder: Secondary | ICD-10-CM | POA: Diagnosis not present

## 2013-04-25 DIAGNOSIS — E663 Overweight: Secondary | ICD-10-CM | POA: Diagnosis not present

## 2013-04-25 DIAGNOSIS — I6350 Cerebral infarction due to unspecified occlusion or stenosis of unspecified cerebral artery: Secondary | ICD-10-CM | POA: Diagnosis not present

## 2013-04-25 DIAGNOSIS — M899 Disorder of bone, unspecified: Secondary | ICD-10-CM | POA: Diagnosis not present

## 2013-04-25 DIAGNOSIS — I10 Essential (primary) hypertension: Secondary | ICD-10-CM | POA: Diagnosis not present

## 2013-05-16 DIAGNOSIS — E119 Type 2 diabetes mellitus without complications: Secondary | ICD-10-CM | POA: Diagnosis not present

## 2013-05-16 DIAGNOSIS — L608 Other nail disorders: Secondary | ICD-10-CM | POA: Diagnosis not present

## 2013-05-16 DIAGNOSIS — L84 Corns and callosities: Secondary | ICD-10-CM | POA: Diagnosis not present

## 2013-08-24 DIAGNOSIS — I739 Peripheral vascular disease, unspecified: Secondary | ICD-10-CM

## 2013-08-24 HISTORY — DX: Peripheral vascular disease, unspecified: I73.9

## 2013-08-31 DIAGNOSIS — I10 Essential (primary) hypertension: Secondary | ICD-10-CM | POA: Diagnosis not present

## 2013-08-31 DIAGNOSIS — F411 Generalized anxiety disorder: Secondary | ICD-10-CM | POA: Diagnosis not present

## 2013-08-31 DIAGNOSIS — Z853 Personal history of malignant neoplasm of breast: Secondary | ICD-10-CM | POA: Diagnosis not present

## 2013-08-31 DIAGNOSIS — I6350 Cerebral infarction due to unspecified occlusion or stenosis of unspecified cerebral artery: Secondary | ICD-10-CM | POA: Diagnosis not present

## 2013-08-31 DIAGNOSIS — K219 Gastro-esophageal reflux disease without esophagitis: Secondary | ICD-10-CM | POA: Diagnosis not present

## 2013-08-31 DIAGNOSIS — G479 Sleep disorder, unspecified: Secondary | ICD-10-CM | POA: Diagnosis not present

## 2013-08-31 DIAGNOSIS — E782 Mixed hyperlipidemia: Secondary | ICD-10-CM | POA: Diagnosis not present

## 2013-09-07 DIAGNOSIS — H26499 Other secondary cataract, unspecified eye: Secondary | ICD-10-CM | POA: Diagnosis not present

## 2013-09-07 DIAGNOSIS — H35379 Puckering of macula, unspecified eye: Secondary | ICD-10-CM | POA: Diagnosis not present

## 2013-10-03 DIAGNOSIS — L608 Other nail disorders: Secondary | ICD-10-CM | POA: Diagnosis not present

## 2013-10-03 DIAGNOSIS — L84 Corns and callosities: Secondary | ICD-10-CM | POA: Diagnosis not present

## 2013-10-03 DIAGNOSIS — E119 Type 2 diabetes mellitus without complications: Secondary | ICD-10-CM | POA: Diagnosis not present

## 2013-11-06 DIAGNOSIS — N9489 Other specified conditions associated with female genital organs and menstrual cycle: Secondary | ICD-10-CM | POA: Diagnosis not present

## 2013-12-04 DIAGNOSIS — L259 Unspecified contact dermatitis, unspecified cause: Secondary | ICD-10-CM | POA: Diagnosis not present

## 2013-12-28 DIAGNOSIS — N183 Chronic kidney disease, stage 3 unspecified: Secondary | ICD-10-CM | POA: Diagnosis not present

## 2013-12-28 DIAGNOSIS — F411 Generalized anxiety disorder: Secondary | ICD-10-CM | POA: Diagnosis not present

## 2013-12-28 DIAGNOSIS — J309 Allergic rhinitis, unspecified: Secondary | ICD-10-CM | POA: Diagnosis not present

## 2013-12-28 DIAGNOSIS — I6350 Cerebral infarction due to unspecified occlusion or stenosis of unspecified cerebral artery: Secondary | ICD-10-CM | POA: Diagnosis not present

## 2013-12-28 DIAGNOSIS — E1129 Type 2 diabetes mellitus with other diabetic kidney complication: Secondary | ICD-10-CM | POA: Diagnosis not present

## 2013-12-28 DIAGNOSIS — E782 Mixed hyperlipidemia: Secondary | ICD-10-CM | POA: Diagnosis not present

## 2013-12-28 DIAGNOSIS — I10 Essential (primary) hypertension: Secondary | ICD-10-CM | POA: Diagnosis not present

## 2013-12-28 DIAGNOSIS — Z853 Personal history of malignant neoplasm of breast: Secondary | ICD-10-CM | POA: Diagnosis not present

## 2013-12-28 DIAGNOSIS — Z23 Encounter for immunization: Secondary | ICD-10-CM | POA: Diagnosis not present

## 2013-12-28 DIAGNOSIS — K219 Gastro-esophageal reflux disease without esophagitis: Secondary | ICD-10-CM | POA: Diagnosis not present

## 2013-12-29 DIAGNOSIS — E782 Mixed hyperlipidemia: Secondary | ICD-10-CM | POA: Diagnosis not present

## 2013-12-29 DIAGNOSIS — F411 Generalized anxiety disorder: Secondary | ICD-10-CM | POA: Diagnosis not present

## 2013-12-29 DIAGNOSIS — K219 Gastro-esophageal reflux disease without esophagitis: Secondary | ICD-10-CM | POA: Diagnosis not present

## 2013-12-29 DIAGNOSIS — E1129 Type 2 diabetes mellitus with other diabetic kidney complication: Secondary | ICD-10-CM | POA: Diagnosis not present

## 2013-12-29 DIAGNOSIS — Z853 Personal history of malignant neoplasm of breast: Secondary | ICD-10-CM | POA: Diagnosis not present

## 2013-12-29 DIAGNOSIS — I10 Essential (primary) hypertension: Secondary | ICD-10-CM | POA: Diagnosis not present

## 2013-12-29 DIAGNOSIS — I6350 Cerebral infarction due to unspecified occlusion or stenosis of unspecified cerebral artery: Secondary | ICD-10-CM | POA: Diagnosis not present

## 2014-01-02 DIAGNOSIS — L608 Other nail disorders: Secondary | ICD-10-CM | POA: Diagnosis not present

## 2014-01-02 DIAGNOSIS — L84 Corns and callosities: Secondary | ICD-10-CM | POA: Diagnosis not present

## 2014-01-02 DIAGNOSIS — I739 Peripheral vascular disease, unspecified: Secondary | ICD-10-CM | POA: Diagnosis not present

## 2014-01-02 DIAGNOSIS — E1149 Type 2 diabetes mellitus with other diabetic neurological complication: Secondary | ICD-10-CM | POA: Diagnosis not present

## 2014-01-12 DIAGNOSIS — R1013 Epigastric pain: Secondary | ICD-10-CM | POA: Diagnosis not present

## 2014-02-01 DIAGNOSIS — K3189 Other diseases of stomach and duodenum: Secondary | ICD-10-CM | POA: Diagnosis not present

## 2014-02-01 DIAGNOSIS — R1013 Epigastric pain: Secondary | ICD-10-CM | POA: Diagnosis not present

## 2014-02-01 DIAGNOSIS — Z79899 Other long term (current) drug therapy: Secondary | ICD-10-CM | POA: Diagnosis not present

## 2014-03-13 ENCOUNTER — Other Ambulatory Visit: Payer: Self-pay | Admitting: Gastroenterology

## 2014-03-13 DIAGNOSIS — K2270 Barrett's esophagus without dysplasia: Secondary | ICD-10-CM | POA: Diagnosis not present

## 2014-03-13 DIAGNOSIS — K229 Disease of esophagus, unspecified: Secondary | ICD-10-CM | POA: Diagnosis not present

## 2014-03-13 DIAGNOSIS — D131 Benign neoplasm of stomach: Secondary | ICD-10-CM | POA: Diagnosis not present

## 2014-03-13 DIAGNOSIS — K3189 Other diseases of stomach and duodenum: Secondary | ICD-10-CM | POA: Diagnosis not present

## 2014-03-13 DIAGNOSIS — K319 Disease of stomach and duodenum, unspecified: Secondary | ICD-10-CM | POA: Diagnosis not present

## 2014-03-29 DIAGNOSIS — K2960 Other gastritis without bleeding: Secondary | ICD-10-CM | POA: Diagnosis not present

## 2014-04-03 DIAGNOSIS — E119 Type 2 diabetes mellitus without complications: Secondary | ICD-10-CM | POA: Diagnosis not present

## 2014-04-03 DIAGNOSIS — L84 Corns and callosities: Secondary | ICD-10-CM | POA: Diagnosis not present

## 2014-04-03 DIAGNOSIS — L608 Other nail disorders: Secondary | ICD-10-CM | POA: Diagnosis not present

## 2014-05-18 DIAGNOSIS — Z23 Encounter for immunization: Secondary | ICD-10-CM | POA: Diagnosis not present

## 2014-05-18 DIAGNOSIS — K146 Glossodynia: Secondary | ICD-10-CM | POA: Diagnosis not present

## 2014-06-12 DIAGNOSIS — K141 Geographic tongue: Secondary | ICD-10-CM | POA: Diagnosis not present

## 2014-07-03 DIAGNOSIS — Z86010 Personal history of colonic polyps: Secondary | ICD-10-CM | POA: Diagnosis not present

## 2014-07-03 DIAGNOSIS — M859 Disorder of bone density and structure, unspecified: Secondary | ICD-10-CM | POA: Diagnosis not present

## 2014-07-03 DIAGNOSIS — I639 Cerebral infarction, unspecified: Secondary | ICD-10-CM | POA: Diagnosis not present

## 2014-07-03 DIAGNOSIS — F411 Generalized anxiety disorder: Secondary | ICD-10-CM | POA: Diagnosis not present

## 2014-07-03 DIAGNOSIS — I10 Essential (primary) hypertension: Secondary | ICD-10-CM | POA: Diagnosis not present

## 2014-07-03 DIAGNOSIS — E782 Mixed hyperlipidemia: Secondary | ICD-10-CM | POA: Diagnosis not present

## 2014-07-03 DIAGNOSIS — Z853 Personal history of malignant neoplasm of breast: Secondary | ICD-10-CM | POA: Diagnosis not present

## 2014-07-03 DIAGNOSIS — E1122 Type 2 diabetes mellitus with diabetic chronic kidney disease: Secondary | ICD-10-CM | POA: Diagnosis not present

## 2014-07-03 DIAGNOSIS — L659 Nonscarring hair loss, unspecified: Secondary | ICD-10-CM | POA: Diagnosis not present

## 2014-07-03 DIAGNOSIS — E663 Overweight: Secondary | ICD-10-CM | POA: Diagnosis not present

## 2014-07-05 DIAGNOSIS — B351 Tinea unguium: Secondary | ICD-10-CM | POA: Diagnosis not present

## 2014-07-05 DIAGNOSIS — E119 Type 2 diabetes mellitus without complications: Secondary | ICD-10-CM | POA: Diagnosis not present

## 2014-07-05 DIAGNOSIS — L859 Epidermal thickening, unspecified: Secondary | ICD-10-CM | POA: Diagnosis not present

## 2014-07-10 DIAGNOSIS — K2960 Other gastritis without bleeding: Secondary | ICD-10-CM | POA: Diagnosis not present

## 2014-08-08 DIAGNOSIS — J0141 Acute recurrent pansinusitis: Secondary | ICD-10-CM | POA: Diagnosis not present

## 2014-08-08 DIAGNOSIS — J331 Polypoid sinus degeneration: Secondary | ICD-10-CM | POA: Diagnosis not present

## 2014-08-08 DIAGNOSIS — J310 Chronic rhinitis: Secondary | ICD-10-CM | POA: Diagnosis not present

## 2014-09-13 DIAGNOSIS — Z961 Presence of intraocular lens: Secondary | ICD-10-CM | POA: Diagnosis not present

## 2014-09-13 DIAGNOSIS — H26492 Other secondary cataract, left eye: Secondary | ICD-10-CM | POA: Diagnosis not present

## 2014-09-13 DIAGNOSIS — H35371 Puckering of macula, right eye: Secondary | ICD-10-CM | POA: Diagnosis not present

## 2014-10-11 DIAGNOSIS — L84 Corns and callosities: Secondary | ICD-10-CM | POA: Diagnosis not present

## 2014-10-11 DIAGNOSIS — E1151 Type 2 diabetes mellitus with diabetic peripheral angiopathy without gangrene: Secondary | ICD-10-CM | POA: Diagnosis not present

## 2014-10-11 DIAGNOSIS — L602 Onychogryphosis: Secondary | ICD-10-CM | POA: Diagnosis not present

## 2014-12-05 DIAGNOSIS — R198 Other specified symptoms and signs involving the digestive system and abdomen: Secondary | ICD-10-CM | POA: Diagnosis not present

## 2014-12-05 DIAGNOSIS — R110 Nausea: Secondary | ICD-10-CM | POA: Diagnosis not present

## 2014-12-05 DIAGNOSIS — K219 Gastro-esophageal reflux disease without esophagitis: Secondary | ICD-10-CM | POA: Diagnosis not present

## 2015-01-01 DIAGNOSIS — I639 Cerebral infarction, unspecified: Secondary | ICD-10-CM | POA: Diagnosis not present

## 2015-01-01 DIAGNOSIS — N183 Chronic kidney disease, stage 3 (moderate): Secondary | ICD-10-CM | POA: Diagnosis not present

## 2015-01-01 DIAGNOSIS — F411 Generalized anxiety disorder: Secondary | ICD-10-CM | POA: Diagnosis not present

## 2015-01-01 DIAGNOSIS — E782 Mixed hyperlipidemia: Secondary | ICD-10-CM | POA: Diagnosis not present

## 2015-01-01 DIAGNOSIS — I10 Essential (primary) hypertension: Secondary | ICD-10-CM | POA: Diagnosis not present

## 2015-01-01 DIAGNOSIS — R7309 Other abnormal glucose: Secondary | ICD-10-CM | POA: Diagnosis not present

## 2015-01-03 DIAGNOSIS — L602 Onychogryphosis: Secondary | ICD-10-CM | POA: Diagnosis not present

## 2015-01-03 DIAGNOSIS — L84 Corns and callosities: Secondary | ICD-10-CM | POA: Diagnosis not present

## 2015-01-03 DIAGNOSIS — E1151 Type 2 diabetes mellitus with diabetic peripheral angiopathy without gangrene: Secondary | ICD-10-CM | POA: Diagnosis not present

## 2015-01-15 DIAGNOSIS — K9289 Other specified diseases of the digestive system: Secondary | ICD-10-CM | POA: Diagnosis not present

## 2015-02-05 ENCOUNTER — Other Ambulatory Visit: Payer: Self-pay | Admitting: Family Medicine

## 2015-02-05 DIAGNOSIS — R51 Headache: Secondary | ICD-10-CM | POA: Diagnosis not present

## 2015-02-05 DIAGNOSIS — G459 Transient cerebral ischemic attack, unspecified: Secondary | ICD-10-CM

## 2015-02-14 ENCOUNTER — Ambulatory Visit
Admission: RE | Admit: 2015-02-14 | Discharge: 2015-02-14 | Disposition: A | Discharge status: Home / Self Care | Payer: Medicare Other | Source: Ambulatory Visit | Attending: Family Medicine | Admitting: Family Medicine

## 2015-02-14 DIAGNOSIS — G459 Transient cerebral ischemic attack, unspecified: Secondary | ICD-10-CM

## 2015-02-14 DIAGNOSIS — R51 Headache: Secondary | ICD-10-CM | POA: Diagnosis not present

## 2015-02-14 DIAGNOSIS — I6523 Occlusion and stenosis of bilateral carotid arteries: Secondary | ICD-10-CM | POA: Diagnosis not present

## 2015-03-06 ENCOUNTER — Encounter: Payer: Self-pay | Admitting: Surgery

## 2015-03-07 ENCOUNTER — Encounter (HOSPITAL_COMMUNITY): Payer: Self-pay | Admitting: Emergency Medicine

## 2015-03-07 ENCOUNTER — Emergency Department (HOSPITAL_COMMUNITY)
Admission: EM | Admit: 2015-03-07 | Discharge: 2015-03-08 | Disposition: A | Discharge status: Home / Self Care | Payer: Medicare Other | Attending: Emergency Medicine | Admitting: Emergency Medicine

## 2015-03-07 DIAGNOSIS — Z8673 Personal history of transient ischemic attack (TIA), and cerebral infarction without residual deficits: Secondary | ICD-10-CM | POA: Diagnosis not present

## 2015-03-07 DIAGNOSIS — R519 Headache, unspecified: Secondary | ICD-10-CM

## 2015-03-07 DIAGNOSIS — H539 Unspecified visual disturbance: Secondary | ICD-10-CM | POA: Diagnosis not present

## 2015-03-07 DIAGNOSIS — E119 Type 2 diabetes mellitus without complications: Secondary | ICD-10-CM | POA: Diagnosis not present

## 2015-03-07 DIAGNOSIS — I10 Essential (primary) hypertension: Secondary | ICD-10-CM | POA: Diagnosis not present

## 2015-03-07 DIAGNOSIS — R404 Transient alteration of awareness: Secondary | ICD-10-CM | POA: Diagnosis not present

## 2015-03-07 DIAGNOSIS — R51 Headache: Secondary | ICD-10-CM | POA: Diagnosis not present

## 2015-03-07 DIAGNOSIS — Z79899 Other long term (current) drug therapy: Secondary | ICD-10-CM | POA: Insufficient documentation

## 2015-03-07 DIAGNOSIS — Z7902 Long term (current) use of antithrombotics/antiplatelets: Secondary | ICD-10-CM | POA: Diagnosis not present

## 2015-03-07 DIAGNOSIS — R42 Dizziness and giddiness: Secondary | ICD-10-CM | POA: Diagnosis not present

## 2015-03-07 DIAGNOSIS — E780 Pure hypercholesterolemia: Secondary | ICD-10-CM | POA: Diagnosis not present

## 2015-03-07 DIAGNOSIS — R531 Weakness: Secondary | ICD-10-CM | POA: Diagnosis not present

## 2015-03-07 HISTORY — DX: Cerebral infarction, unspecified: I63.9

## 2015-03-07 NOTE — ED Notes (Signed)
Per EMS, around 8:30 tonight, patient reports extreme pressure on top of head, and visual changes. She reports it lasts approximately 40 minutes. 3-4 weeks ago recently diagnosed with TIA. Denies n/v/ and weakness. Speech clear. 12 lead normal sinus with occasional pvc's. No difficulty breathing. 20g in right ac present on arrival. VS bp 139/64, p 70 reg, rr 18, 96% on room air, cbg 119. From independent living "[facility]".

## 2015-03-07 NOTE — ED Provider Notes (Signed)
CSN: [id number]     Arrival date & time 03/07/15  2320 History   First MD Initiated Contact with Patient 03/07/15 2326     Chief Complaint  Patient presents with  . Headache     (Consider location/radiation/quality/duration/timing/severity/associated sxs/prior Treatment) HPI Comments: Patient is an 81 year old female past medical history significant for hypertension, DM, HLD, TIA presented to the emergency department for headache that began at 8:30 PM this evening. Patient reports that she developed intense pain to the top of her head, describes it as pressure and heaviness. She states her vision became blurred and she felt as though she was on a boat. She denies any syncopal episode, associated nausea, vomiting, weakness, chest pain or shortness of breath or speech difficulties. She states the headache resolved approximately 40 minutes later, but was only intense for a few seconds and has been gradually improving since onset. Endorses a distant history of vertigo. Denies any fevers or recent illnesses. Patient denies any falls or traumas. She does not take any blood thinners.   Past Medical History  Diagnosis Date  . Hypercholesteremia   . Hypertension   . Diabetes mellitus   . Stroke     reports TIA 3-4 weeks ago   Past Surgical History  Procedure Laterality Date  . Appendectomy    . Abdominal hysterectomy    . Mastectomy    . Humerus fracture surgery     History reviewed. No pertinent family history. History  Substance Use Topics  . Smoking status: Never Smoker   . Smokeless tobacco: Not on file  . Alcohol Use: No   OB History    No data available     Review of Systems  Neurological: Positive for dizziness and headaches.  All other systems reviewed and are negative.     Allergies  Morphine and related and Sulfa antibiotics  Home Medications   Prior to Admission medications   Medication Sig Start Date End Date Taking? Authorizing Provider  ALPRAZolam Duanne Moron) 0.5  MG tablet Take 0.5 mg by mouth 3 (three) times daily as needed for anxiety.    Yes Historical Provider, MD  amLODipine (NORVASC) 10 MG tablet Take 10 mg by mouth daily.   Yes Historical Provider, MD  atenolol (TENORMIN) 50 MG tablet Take 50 mg by mouth 2 (two) times daily.    Yes Historical Provider, MD  atorvastatin (LIPITOR) 80 MG tablet Take 80 mg by mouth daily.   Yes Historical Provider, MD  b complex vitamins tablet Take 1 tablet by mouth daily.   Yes Historical Provider, MD  BIOTIN PO Take 1 tablet by mouth daily.   Yes Historical Provider, MD  CALCIUM PO Take 1 tablet by mouth 3 (three) times daily.   Yes Historical Provider, MD  Cholecalciferol (VITAMIN D) 2000 UNITS CAPS Take 1 capsule by mouth daily.   Yes Historical Provider, MD  clopidogrel (PLAVIX) 75 MG tablet Take 75 mg by mouth daily.   Yes Historical Provider, MD  FOLIC ACID PO Take 1 tablet by mouth daily.   Yes Historical Provider, MD  Ginger, Zingiber officinalis, (GINGER ROOT) 550 MG CAPS Take 4 capsules by mouth daily.   Yes Historical Provider, MD  mometasone (NASONEX) 50 MCG/ACT nasal spray Place 2 sprays into the nose daily.   Yes Historical Provider, MD  Multiple Vitamin (MULTIVITAMIN WITH MINERALS) TABS tablet Take 1 tablet by mouth daily.   Yes Historical Provider, MD  omeprazole (PRILOSEC) 20 MG capsule Take 20 mg by mouth daily.  Yes Historical Provider, MD  ramipril (ALTACE) 10 MG capsule Take 10 mg by mouth 2 (two) times daily.    Yes Historical Provider, MD  vitamin C (ASCORBIC ACID) 500 MG tablet Take 500 mg by mouth daily.   Yes Historical Provider, MD   BP 148/87 mmHg  Pulse 63  Temp(Src) 98.1 F (36.7 C) (Oral)  Resp 18  Ht 5\' 4"  (1.626 m)  Wt 147 lb (66.679 kg)  BMI 25.22 kg/m2  SpO2 97% Physical Exam  Constitutional: She is oriented to person, place, and time. She appears well-developed and well-nourished. No distress.  HENT:  Head: Normocephalic and atraumatic.  Right Ear: External ear normal.   Left Ear: External ear normal.  Nose: Nose normal.  Mouth/Throat: Oropharynx is clear and moist. No oropharyngeal exudate.  Eyes: Conjunctivae and EOM are normal. Pupils are equal, round, and reactive to light.  Neck: Normal range of motion. Neck supple.  Cardiovascular: Normal rate, regular rhythm, normal heart sounds and intact distal pulses.   Pulmonary/Chest: Effort normal and breath sounds normal. No respiratory distress.  Abdominal: Soft. There is no tenderness.  Neurological: She is alert and oriented to person, place, and time. She has normal strength. No cranial nerve deficit. Gait normal. GCS eye subscore is 4. GCS verbal subscore is 5. GCS motor subscore is 6.  Sensation grossly intact.  No pronator drift.  Bilateral heel-knee-shin intact.  Skin: Skin is warm and dry. She is not diaphoretic.  Nursing note and vitals reviewed.   ED Course  Procedures (including critical care time) Medications - No data to display  Labs Review Labs Reviewed - No data to display  Imaging Review Ct Head Wo Contrast  03/08/2015   CLINICAL DATA:  Head pressure, posterior head pain. History of headaches. History of hypertension, diabetes, stroke.  EXAM: CT HEAD WITHOUT CONTRAST  TECHNIQUE: Contiguous axial images were obtained from the base of the skull through the vertex without intravenous contrast.  COMPARISON:  MRI of the brain February 14, 2015  FINDINGS: No intraparenchymal hemorrhage, mass effect nor midline shift. Old LEFT basal ganglia lacunar infarct with mild ex vacuo dilatation LEFT frontal horn of the lateral ventricle, no hydrocephalus. Patchy supratentorial white matter hypodensities are within normal range for patient's age and though non-specific suggest sequelae of chronic small vessel ischemic disease. No acute large vascular territory infarcts.  No abnormal extra-axial fluid collections. Basal cisterns are patent. Moderate calcific atherosclerosis of the carotid siphons.  No skull  fracture. Osteopenia. The included ocular globes and orbital contents are non-suspicious ; status post bilateral ocular lens implants. Status post FESS, no paranasal sinus air-fluid levels. The mastoid air cells are well aerated.  IMPRESSION: No acute intracranial process.  Chronic changes including LEFT basal ganglia lacunar infarct and mild to moderate white matter changes compatible with chronic small vessel ischemic disease.   Electronically Signed   By: Elon Alas M.D.   On: 03/08/2015 00:37   Mr Brain Wo Contrast  03/08/2015   CLINICAL DATA:  Head pressure and visual changes, similar symptoms for 2 years, worse over last hour. Diagnosed with transient ischemic attack 3-4 weeks ago. History of diabetes, hypertension, stroke, breast cancer.  EXAM: MRI HEAD WITHOUT CONTRAST  TECHNIQUE: Multiplanar, multiecho pulse sequences of the brain and surrounding structures were obtained without intravenous contrast.  COMPARISON:  CT head March 08, 2015 at 0007 hours and MRI of the head February 14, 2015  FINDINGS: No reduced diffusion to suggest acute ischemia. No susceptibility artifact to  suggest hemorrhage. Cystic LEFT basal ganglia lacunar infarct with mild ex vacuo dilatation LEFT lateral ventricle, ventricles and sulci are otherwise normal for patient's age. Patchy supratentorial, pontine white matter T2 hyperintensities are similar without midline shift, mass effect or mass lesions.  No abnormal extra-axial fluid collections. Normal major intracranial vascular flow voids seen at skull base. Status post bilateral ocular lens implants. Status post FESS without paranasal sinus air-fluid levels. The mastoid air cells are well aerated. Fluid filled non expanded sella. No cerebellar tonsillar ectopia. Bright T1 bone marrow signal compatible with osteopenia. Pannus about the odontoid process can be seen with CPPD.  IMPRESSION: No acute intracranial process, specifically no acute ischemia.  Chronic changes including  level the LEFT basal ganglia lacunar infarct and moderate white matter changes compatible with chronic small vessel ischemic disease.   Electronically Signed   By: Elon Alas M.D.   On: 03/08/2015 02:32     EKG Interpretation None      MDM   Final diagnoses:  Dizziness  Headache    Filed Vitals:   03/08/15 0256  BP: 148/87  Pulse: 63  Temp:   Resp: 18   Afebrile, NAD, non-toxic appearing, AAOx4.   I have reviewed nursing notes, vital signs, and all appropriate lab and imaging results if ordered as above.   No neurofocal deficits on examination. Patient is hemodynamically stable. CT scan is reviewed without acute abnormality. MRI ordered without acute abnormality. Low suspicion for cardiac or other intracranial process. Patient was remained symptom-free in the emergency department. Return precautions discussed. Advised PCP follow-up. Patient is agreeable to discharge home. Stable at time of discharge.  Patient d/w with Dr. Claudine Mouton, agrees with plan.      Baron Sane, PA-C 03/08/15 [id number]  Everlene Balls, MD 03/08/15 1340

## 2015-03-08 ENCOUNTER — Encounter: Payer: Medicare Other | Admitting: Surgery

## 2015-03-08 ENCOUNTER — Emergency Department (HOSPITAL_COMMUNITY): Payer: Medicare Other

## 2015-03-08 ENCOUNTER — Encounter (HOSPITAL_COMMUNITY): Payer: Self-pay | Admitting: Emergency Medicine

## 2015-03-08 DIAGNOSIS — R51 Headache: Secondary | ICD-10-CM | POA: Diagnosis not present

## 2015-03-08 DIAGNOSIS — H539 Unspecified visual disturbance: Secondary | ICD-10-CM | POA: Diagnosis not present

## 2015-03-08 NOTE — Discharge Instructions (Signed)
Please follow up with your primary care physician in 1-2 days. If you do not have one please call the [facility] number listed above. Please read all discharge instructions and return precautions.   General Headache Without Cause A headache is pain or discomfort felt around the head or neck area. The specific cause of a headache may not be found. There are many causes and types of headaches. A few common ones are:  Tension headaches.  Migraine headaches.  Cluster headaches.  Chronic daily headaches. HOME CARE INSTRUCTIONS   Keep all follow-up appointments with your caregiver or any specialist referral.  Only take over-the-counter or prescription medicines for pain or discomfort as directed by your caregiver.  Lie down in a dark, quiet room when you have a headache.  Keep a headache journal to find out what may trigger your migraine headaches. For example, write down:  What you eat and drink.  How much sleep you get.  Any change to your diet or medicines.  Try massage or other relaxation techniques.  Put ice packs or heat on the head and neck. Use these 3 to 4 times per day for 15 to 20 minutes each time, or as needed.  Limit stress.  Sit up straight, and do not tense your muscles.  Quit smoking if you smoke.  Limit alcohol use.  Decrease the amount of caffeine you drink, or stop drinking caffeine.  Eat and sleep on a regular schedule.  Get 7 to 9 hours of sleep, or as recommended by your caregiver.  Keep lights dim if bright lights bother you and make your headaches worse. SEEK MEDICAL CARE IF:   You have problems with the medicines you were prescribed.  Your medicines are not working.  You have a change from the usual headache.  You have nausea or vomiting. SEEK IMMEDIATE MEDICAL CARE IF:   Your headache becomes severe.  You have a fever.  You have a stiff neck.  You have loss of vision.  You have muscular weakness or loss of  muscle control.  You start losing your balance or have trouble walking.  You feel faint or pass out.  You have severe symptoms that are different from your first symptoms. MAKE SURE YOU:   Understand these instructions.  Will watch your condition.  Will get help right away if you are not doing well or get worse. Document Released: 08/10/2005 Document Revised: 11/02/2011 Document Reviewed: 08/26/2011 [facility] Patient Information 2015 [place], [place]. This information is not intended to replace advice given to you by your health care provider. Make sure you discuss any questions you have with your health care provider. Dizziness Dizziness is a common problem. It is a feeling of unsteadiness or light-headedness. You may feel like you are about to faint. Dizziness can lead to injury if you stumble or fall. A person of any age group can suffer from dizziness, but dizziness is more common in older adults. CAUSES  Dizziness can be caused by many different things, including:  Middle ear problems.  Standing for too long.  Infections.  An allergic reaction.  Aging.  An emotional response to something, such as the sight of blood.  Side effects of medicines.  Tiredness.  Problems with circulation or blood pressure.  Excessive use of alcohol or medicines, or illegal drug use.  Breathing too fast (hyperventilation).  An irregular heart rhythm (arrhythmia).  A low red blood cell count (anemia).  Pregnancy.  Vomiting, diarrhea, fever, or other  illnesses that cause body fluid loss (dehydration).  Diseases or conditions such as Parkinson's disease, high blood pressure (hypertension), diabetes, and thyroid problems.  Exposure to extreme heat. DIAGNOSIS  Your health care provider will ask about your symptoms, perform a physical exam, and perform an electrocardiogram (ECG) to record the electrical activity of your heart. Your health care provider may also perform other heart or  blood tests to determine the cause of your dizziness. These may include:  Transthoracic echocardiogram (TTE). During echocardiography, sound waves are used to evaluate how blood flows through your heart.  Transesophageal echocardiogram (TEE).  Cardiac monitoring. This allows your health care provider to monitor your heart rate and rhythm in real time.  Holter monitor. This is a portable device that records your heartbeat and can help diagnose heart arrhythmias. It allows your health care provider to track your heart activity for several days if needed.  Stress tests by exercise or by giving medicine that makes the heart beat faster. TREATMENT  Treatment of dizziness depends on the cause of your symptoms and can vary greatly. HOME CARE INSTRUCTIONS   Drink enough fluids to keep your urine clear or pale yellow. This is especially important in very hot weather. In older adults, it is also important in cold weather.  Take your medicine exactly as directed if your dizziness is caused by medicines. When taking blood pressure medicines, it is especially important to get up slowly.  Rise slowly from chairs and steady yourself until you feel okay.  In the morning, first sit up on the side of the bed. When you feel okay, stand slowly while holding onto something until you know your balance is fine.  Move your legs often if you need to stand in one place for a long time. Tighten and relax your muscles in your legs while standing.  Have someone stay with you for 1-2 days if dizziness continues to be a problem. Do this until you feel you are well enough to stay alone. Have the person call your health care provider if he or she notices changes in you that are concerning.  Do not drive or use heavy machinery if you feel dizzy.  Do not drink alcohol. SEEK IMMEDIATE MEDICAL CARE IF:   Your dizziness or light-headedness gets worse.  You feel nauseous or vomit.  You have problems talking, walking,  or using your arms, hands, or legs.  You feel weak.  You are not thinking clearly or you have trouble forming sentences. It may take a friend or family member to notice this.  You have chest pain, abdominal pain, shortness of breath, or sweating.  Your vision changes.  You notice any bleeding.  You have side effects from medicine that seems to be getting worse rather than better. MAKE SURE YOU:   Understand these instructions.  Will watch your condition.  Will get help right away if you are not doing well or get worse. Document Released: 02/03/2001 Document Revised: 08/15/2013 Document Reviewed: 02/27/2011 [facility] Patient Information 2015 [place], [place]. This information is not intended to replace advice given to you by your health care provider. Make sure you discuss any questions you have with your health care provider.

## 2015-03-08 NOTE — ED Notes (Signed)
EDP at bedside  

## 2015-03-11 ENCOUNTER — Encounter: Payer: Self-pay | Admitting: Vascular Surgery

## 2015-03-13 ENCOUNTER — Encounter: Payer: Self-pay | Admitting: Vascular Surgery

## 2015-03-13 ENCOUNTER — Ambulatory Visit (INDEPENDENT_AMBULATORY_CARE_PROVIDER_SITE_OTHER): Payer: Medicare Other | Admitting: Vascular Surgery

## 2015-03-13 VITALS — BP 128/70 | HR 78 | Temp 97.9°F | Resp 16 | Ht 63.35 in | Wt 148.0 lb

## 2015-03-13 DIAGNOSIS — I6529 Occlusion and stenosis of unspecified carotid artery: Secondary | ICD-10-CM | POA: Insufficient documentation

## 2015-03-13 DIAGNOSIS — I6523 Occlusion and stenosis of bilateral carotid arteries: Secondary | ICD-10-CM

## 2015-03-13 NOTE — Progress Notes (Signed)
New Carotid Patient  Referred by:  Kathyrn Lass, MD [place], [place] [address]  Reason for referral: L carotid stenosis  History of Present Illness  Helen [name] is a 81 y.o. ([birth year]) female who presents with chief complaint: occiput heaviness and L temporal paraesthesia.  Previous carotid studies demonstrated: RICA <62% stenosis, LICA 13-08% stenosis.  Patient has NO history of TIA or stroke symptom.  The patient has never had amaurosis fugax or monocular blindness.  The patient has never had facial drooping or hemiplegia.  The patient has never had receptive or expressive aphasia.   The patient has had episodes of difficulty remembering names.  The patient describes her current sx as: "weight on top of head" and "tingling in L side of upper head."  The patient's risks factors for carotid disease include: HLD, HTN, DM.  Past Medical History  Diagnosis Date  . Hypercholesteremia   . Hypertension   . Diabetes mellitus   . Stroke     reports TIA 3-4 weeks ago  . Anemia   . CAD (coronary artery disease)   . Peripheral vascular disease   . Cancer     Breast cancer    Past Surgical History  Procedure Laterality Date  . Abdominal hysterectomy    . Mastectomy    . Humerus fracture surgery    . Appendectomy  1951  . Nasal septum surgery  1977    Dr. Ernesto Rutherford  . [place] luc sinusotomy  [id number]  . Cholecystectomy  2003    By Dr. Excell Seltzer  . Fracture surgery Right     ORIF   by Dr. Burney Gauze  . Eye surgery Bilateral 2009    Cataract surgery  . Toenail excision      History   Social History  . Marital Status: Widowed    Spouse Name: N/A  . Number of Children: N/A  . Years of Education: N/A   Occupational History  . Not on file.   Social History Main Topics  . Smoking status: Former Smoker    Quit date: 08/24/1978  . Smokeless tobacco: Not on file  . Alcohol Use: No  . Drug Use: No  . Sexual Activity: Not on file   Other Topics Concern  .  Not on file   Social History Narrative    Family History  Problem Relation Age of Onset  . Cancer Mother   . Heart disease Father   . Heart attack Father   . Diabetes Brother   . Heart disease Brother   . Hyperlipidemia Brother     Current Outpatient Prescriptions on File Prior to Visit  Medication Sig Dispense Refill  . ALPRAZolam (XANAX) 0.5 MG tablet Take 0.5 mg by mouth 3 (three) times daily as needed for anxiety.     [name] amLODipine (NORVASC) 10 MG tablet Take 10 mg by mouth daily.    [name] atenolol (TENORMIN) 50 MG tablet Take 50 mg by mouth 2 (two) times daily.     [name] atorvastatin (LIPITOR) 80 MG tablet Take 80 mg by mouth daily.    [name] b complex vitamins tablet Take 1 tablet by mouth daily.    [name] BIOTIN PO Take 1 tablet by mouth daily.    [name] CALCIUM PO Take 1 tablet by mouth 3 (three) times daily.    . Cholecalciferol (VITAMIN D) 2000 UNITS CAPS Take 1 capsule by mouth daily.    . clopidogrel (PLAVIX) 75 MG tablet Take 75 mg by mouth  daily.    [name] FOLIC ACID PO Take 1 tablet by mouth daily.    . Ginger, Zingiber officinalis, (GINGER ROOT) 550 MG CAPS Take 4 capsules by mouth daily.    . mometasone (NASONEX) 50 MCG/ACT nasal spray Place 2 sprays into the nose daily.    . Multiple Vitamin (MULTIVITAMIN WITH MINERALS) TABS tablet Take 1 tablet by mouth daily.    [name] omeprazole (PRILOSEC) 20 MG capsule Take 20 mg by mouth daily.    . ramipril (ALTACE) 10 MG capsule Take 10 mg by mouth 2 (two) times daily.     . vitamin C (ASCORBIC ACID) 500 MG tablet Take 500 mg by mouth daily.     No current facility-administered medications on file prior to visit.    Allergies  Allergen Reactions  . Morphine And Related Nausea And Vomiting  . Sulfa Antibiotics Hives    REVIEW OF SYSTEMS:  (Positives checked otherwise negative)  CARDIOVASCULAR:   [ ]  chest pain,  [ ]  chest pressure,  [ ]  palpitations,  [ ]  shortness of breath when laying flat,  [ ]  shortness of breath with exertion,   [ ]  pain  in feet when walking,  [ ]  pain in feet when laying flat, [ ]  history of blood clot in veins (DVT),  [ ]  history of phlebitis,  [ ]  swelling in legs,  [ ]  varicose veins  PULMONARY:   [ ]  productive cough,  [ ]  asthma,  [ ]  wheezing  NEUROLOGIC:   [ ]  weakness in arms or legs,  [ ]  numbness in arms or legs,  [ ]  difficulty speaking or slurred speech,  [ ]  temporary loss of vision in one eye,  [ ]  dizziness  HEMATOLOGIC:   [ ]  bleeding problems,  [ ]  problems with blood clotting too easily  MUSCULOSKEL:   [ ]  joint pain, [ ]  joint swelling  GASTROINTEST:   [ ]  vomiting blood,  [ ]  blood in stool     GENITOURINARY:   [ ]  burning with urination,  [ ]  blood in urine  PSYCHIATRIC:   [ ]  history of major depression  INTEGUMENTARY:   [ ]  rashes,  [ ]  ulcers  CONSTITUTIONAL:   [ ]  fever,  [ ]  chills    For VQI Use Only  PRE-ADM LIVING: Home  AMB STATUS: Ambulatory  CAD Sx: None  PRIOR CHF: None  STRESS TEST: [x]  No, [ ]  Normal, [ ]  + ischemia, [ ]  + MI, [ ]  Both   Physical Examination  Filed Vitals:   03/13/15 1559  BP: 128/70  Pulse: 78  Temp: 97.9 F (36.6 C)  TempSrc: Oral  Resp: 16  Height: 5' 3.35" (1.609 m)  Weight: 148 lb (67.132 kg)  SpO2: 94%   Body mass index is 25.93 kg/(m^2).  General: A&O x 3, WDWN  Head: [place]/AT  Ear/Nose/Throat: Hearing grossly intact, nares w/o erythema or drainage, oropharynx w/o Erythema/Exudate, Mallampati score: 3  Eyes: PERRLA, EOMI, Post surg chg to R lens  Neck: Supple, no nuchal rigidity, no palpable LAD  Pulmonary: Sym exp, good air movt, CTAB, no rales, rhonchi, & wheezing  Cardiac: RRR, Nl S1, S2, no Murmurs, rubs or gallops  Vascular: Vessel Right Left  Radial Faintly Palpable Faintly Palpable  Brachial Palpable Faintly Palpable  Carotid Palpable, without bruit Palpable, without bruit  Aorta Not palpable N/A  Femoral Palpable Palpable  Popliteal Not palpable Not palpable  PT Not  Palpable Not Palpable  DP  Faintly Palpable Faintly Palpable   Gastrointestinal: soft, NTND, -G/R, - HSM, - masses, - CVAT B  Musculoskeletal: M/S 5/5 throughout , Extremities without ischemic changes   Neurologic: CN 2-12 grossly intact , Pain and light touch intact in extremities . Motor exam as listed above  Psychiatric: Judgment intact, Mood & affect appropriate for pt's clinical situation  Dermatologic: See M/S exam for extremity exam, no rashes otherwise noted  Lymph : No Cervical, Axillary, or Inguinal lymphadenopathy    Non-Invasive Vascular Imaging  Outside CAROTID DUPLEX (Date: 02/14/15):  1. Interval progression of now moderate to large amount of left-sided atherosclerotic plaque, now resulting in borderline elevated peak systolic velocities within the proximal aspect of the left internal carotid artery approaching the lower end of the 50-69% luminal narrowing range. Further evaluation with CTA could be performed as clinically indicated. 2. Interval progression of right-sided atherosclerotic plaque within the right common carotid artery, likely resulting in a hemodynamically significant stenosis within the proximal CCA. Again, this could be further evaluated with CTA as clinically indicated. 3. Suspected progression of atherosclerotic plaque within the right carotid bulb and proximal aspect of the right internal carotid artery though not definitely resulting in a hemodynamically significant stenosis, though these velocity measurements could be artifactually depressed in the setting of a more proximal stenosis involving the CCA.  Based on the velocities listed, I would read this as a R ICA stenosis <70% and L ICA stenosis <70%.   Head MRI (03/08/15) No acute intracranial process, specifically no acute ischemia.  Chronic changes including level the LEFT basal ganglia lacunar infarct and moderate white matter changes compatible with chronic small vessel ischemic  disease.   Outside Studies/Documentation 10 pages of outside documents were reviewed including: outpatient PCP chart and outside [place].  Medical Decision Making  Helen [name] is a 81 y.o. female who presents with: B asx ICA stenosis <70%, likely some degree of cognitive dysfunction, atypical symptoms possible related to migraine    I don't think this patient's sx meet the criteria for CVA or TIA.  I also don't agree with the Carotid duplex read, but I don't have the images available.  Specifically, I would not have made much of the CCA velocities.  Based on the patient's vascular studies and examination, I have offered the patient: refer to Neurologist for neuropsych testing for cognitive dysfunction and evaluation of her atypical migraine sx.  I discussed in depth with the patient the nature of atherosclerosis, and emphasized the importance of maximal medical management including strict control of blood pressure, blood glucose, and lipid levels, obtaining regular exercise, antiplatelet agents, and cessation of smoking.   The patient is currently on a statin: Lipitor.  The patient is currently on an anti-platelet: Plavix.  The patient is aware that without maximal medical management the underlying atherosclerotic disease process will progress, limiting the benefit of any interventions.  The patient is going to follow up with as needed in the future.    Thank you for allowing [place] to participate in this patient's care.  Adele Barthel, MD [facility] Office: [phone] Pager: [phone]  03/13/2015, 5:34 PM

## 2015-04-16 DIAGNOSIS — H5319 Other subjective visual disturbances: Secondary | ICD-10-CM | POA: Diagnosis not present

## 2015-04-16 DIAGNOSIS — H35373 Puckering of macula, bilateral: Secondary | ICD-10-CM | POA: Diagnosis not present

## 2015-04-18 DIAGNOSIS — L659 Nonscarring hair loss, unspecified: Secondary | ICD-10-CM | POA: Diagnosis not present

## 2015-04-18 DIAGNOSIS — H539 Unspecified visual disturbance: Secondary | ICD-10-CM | POA: Diagnosis not present

## 2015-04-18 DIAGNOSIS — Z23 Encounter for immunization: Secondary | ICD-10-CM | POA: Diagnosis not present

## 2015-04-18 DIAGNOSIS — J309 Allergic rhinitis, unspecified: Secondary | ICD-10-CM | POA: Diagnosis not present

## 2015-04-18 DIAGNOSIS — N183 Chronic kidney disease, stage 3 (moderate): Secondary | ICD-10-CM | POA: Diagnosis not present

## 2015-04-18 DIAGNOSIS — G459 Transient cerebral ischemic attack, unspecified: Secondary | ICD-10-CM | POA: Diagnosis not present

## 2015-04-18 DIAGNOSIS — R110 Nausea: Secondary | ICD-10-CM | POA: Diagnosis not present

## 2015-05-21 DIAGNOSIS — G43819 Other migraine, intractable, without status migrainosus: Secondary | ICD-10-CM | POA: Diagnosis not present

## 2015-05-21 DIAGNOSIS — H04123 Dry eye syndrome of bilateral lacrimal glands: Secondary | ICD-10-CM | POA: Diagnosis not present

## 2015-06-04 DIAGNOSIS — E1151 Type 2 diabetes mellitus with diabetic peripheral angiopathy without gangrene: Secondary | ICD-10-CM | POA: Diagnosis not present

## 2015-06-04 DIAGNOSIS — L602 Onychogryphosis: Secondary | ICD-10-CM | POA: Diagnosis not present

## 2015-06-04 DIAGNOSIS — L84 Corns and callosities: Secondary | ICD-10-CM | POA: Diagnosis not present

## 2015-06-21 DIAGNOSIS — K3189 Other diseases of stomach and duodenum: Secondary | ICD-10-CM | POA: Diagnosis not present

## 2015-10-22 ENCOUNTER — Encounter (HOSPITAL_COMMUNITY): Payer: Self-pay | Admitting: Emergency Medicine

## 2015-10-22 ENCOUNTER — Emergency Department (HOSPITAL_COMMUNITY): Payer: Medicare Other

## 2015-10-22 ENCOUNTER — Emergency Department (HOSPITAL_COMMUNITY)
Admission: EM | Admit: 2015-10-22 | Discharge: 2015-10-22 | Disposition: A | Discharge status: Home / Self Care | Payer: Medicare Other | Attending: Emergency Medicine | Admitting: Emergency Medicine

## 2015-10-22 DIAGNOSIS — Z87891 Personal history of nicotine dependence: Secondary | ICD-10-CM | POA: Diagnosis not present

## 2015-10-22 DIAGNOSIS — Z79899 Other long term (current) drug therapy: Secondary | ICD-10-CM | POA: Diagnosis not present

## 2015-10-22 DIAGNOSIS — G459 Transient cerebral ischemic attack, unspecified: Secondary | ICD-10-CM | POA: Insufficient documentation

## 2015-10-22 DIAGNOSIS — I10 Essential (primary) hypertension: Secondary | ICD-10-CM | POA: Insufficient documentation

## 2015-10-22 DIAGNOSIS — I6789 Other cerebrovascular disease: Secondary | ICD-10-CM | POA: Diagnosis not present

## 2015-10-22 DIAGNOSIS — Z7902 Long term (current) use of antithrombotics/antiplatelets: Secondary | ICD-10-CM | POA: Diagnosis not present

## 2015-10-22 DIAGNOSIS — E7800 Pure hypercholesterolemia, unspecified: Secondary | ICD-10-CM | POA: Diagnosis not present

## 2015-10-22 DIAGNOSIS — Z862 Personal history of diseases of the blood and blood-forming organs and certain disorders involving the immune mechanism: Secondary | ICD-10-CM | POA: Diagnosis not present

## 2015-10-22 DIAGNOSIS — I2510 Atherosclerotic heart disease of native coronary artery without angina pectoris: Secondary | ICD-10-CM | POA: Insufficient documentation

## 2015-10-22 DIAGNOSIS — R4781 Slurred speech: Secondary | ICD-10-CM | POA: Diagnosis not present

## 2015-10-22 DIAGNOSIS — Z853 Personal history of malignant neoplasm of breast: Secondary | ICD-10-CM | POA: Diagnosis not present

## 2015-10-22 DIAGNOSIS — E119 Type 2 diabetes mellitus without complications: Secondary | ICD-10-CM | POA: Insufficient documentation

## 2015-10-22 DIAGNOSIS — R4789 Other speech disturbances: Secondary | ICD-10-CM | POA: Diagnosis present

## 2015-10-22 LAB — COMPREHENSIVE METABOLIC PANEL
ALT: 22 U/L (ref 14–54)
AST: 24 U/L (ref 15–41)
Albumin: 3.9 g/dL (ref 3.5–5.0)
Alkaline Phosphatase: 57 U/L (ref 38–126)
Anion gap: 12 (ref 5–15)
BUN: 11 mg/dL (ref 6–20)
CO2: 31 mmol/L (ref 22–32)
Calcium: 9.5 mg/dL (ref 8.9–10.3)
Chloride: 97 mmol/L — ABNORMAL LOW (ref 101–111)
Creatinine, Ser: 0.83 mg/dL (ref 0.44–1.00)
GFR calc Af Amer: >60 mL/min (ref >60)
GFR calc non Af Amer: >60 mL/min (ref >60)
Glucose, Bld: 123 mg/dL — ABNORMAL HIGH (ref 65–99)
Potassium: 4 mmol/L (ref 3.5–5.1)
Sodium: 140 mmol/L (ref 135–145)
Total Bilirubin: 0.7 mg/dL (ref 0.3–1.2)
Total Protein: 7.5 g/dL (ref 6.5–8.1)

## 2015-10-22 LAB — CBC WITH DIFFERENTIAL/PLATELET
Basophils Absolute: 0 10*3/uL (ref 0.0–0.1)
Basophils Relative: 0 %
Eosinophils Absolute: 0.2 10*3/uL (ref 0.0–0.7)
Eosinophils Relative: 2 %
HCT: 45 % (ref 36.0–46.0)
Hemoglobin: 15.2 g/dL — ABNORMAL HIGH (ref 12.0–15.0)
Lymphocytes Relative: 14 %
Lymphs Abs: 1.3 10*3/uL (ref 0.7–4.0)
MCH: 29.2 pg (ref 26.0–34.0)
MCHC: 33.8 g/dL (ref 30.0–36.0)
MCV: 86.4 fL (ref 78.0–100.0)
Monocytes Absolute: 0.6 10*3/uL (ref 0.1–1.0)
Monocytes Relative: 7 %
Neutro Abs: 7.4 10*3/uL (ref 1.7–7.7)
Neutrophils Relative %: 77 %
Platelets: 154 10*3/uL (ref 150–400)
RBC: 5.21 MIL/uL — ABNORMAL HIGH (ref 3.87–5.11)
RDW: 13.5 % (ref 11.5–15.5)
WBC: 9.6 10*3/uL (ref 4.0–10.5)

## 2015-10-22 LAB — URINALYSIS, ROUTINE W REFLEX MICROSCOPIC
Bilirubin Urine: NEGATIVE
Glucose, UA: NEGATIVE mg/dL
Hgb urine dipstick: NEGATIVE
Ketones, ur: NEGATIVE mg/dL
Leukocytes, UA: NEGATIVE
Nitrite: NEGATIVE
Protein, ur: 30 mg/dL — AB
Specific Gravity, Urine: 1.01 (ref 1.005–1.030)
pH: 7 (ref 5.0–8.0)

## 2015-10-22 LAB — URINE MICROSCOPIC-ADD ON: RBC / HPF: NONE SEEN RBC/hpf (ref 0–5)

## 2015-10-22 MED ORDER — CLOPIDOGREL BISULFATE 75 MG PO TABS
75.0000 mg | ORAL_TABLET | Freq: Once | ORAL | Status: AC
  Administered 2015-10-22: 75 mg via ORAL
  Filled 2015-10-22: qty 1

## 2015-10-22 NOTE — ED Notes (Signed)
Pt an famity verbalize understanding of instructions.

## 2015-10-22 NOTE — Discharge Instructions (Signed)
Continue your plavix.   Transient Ischemic Attack A transient ischemic attack (TIA) is a "warning stroke" that causes stroke-like symptoms. Unlike a stroke, a TIA does not cause permanent damage to the brain. The symptoms of a TIA can happen very fast and do not last long. It is important to know the symptoms of a TIA and what to do. This can help prevent a major stroke or death. CAUSES  A TIA is caused by a temporary blockage in an artery in the brain or neck (carotid artery). The blockage does not allow the brain to get the blood supply it needs and can cause different symptoms. The blockage can be caused by either:  A blood clot.  Fatty buildup (plaque) in a neck or brain artery. RISK FACTORS  High blood pressure (hypertension).  High cholesterol.  Diabetes mellitus.  Heart disease.  The buildup of plaque in the blood vessels (peripheral artery disease or atherosclerosis).  The buildup of plaque in the blood vessels that provide blood and oxygen to the brain (carotid artery stenosis).  An abnormal heart rhythm (atrial fibrillation).  Obesity.  Using any tobacco products, including cigarettes, chewing tobacco, or electronic cigarettes.  Taking oral contraceptives, especially in combination with using tobacco.  Physical inactivity.  A diet high in fats, salt (sodium), and calories.  Excessive alcohol use.  Use of illegal drugs (especially cocaine and methamphetamine).  Being female.  Being African American.  Being over the age of 33 years.  Family history of stroke.  Previous history of blood clots, stroke, TIA, or heart attack.  Sickle cell disease. SIGNS AND SYMPTOMS  TIA symptoms are the same as a stroke but are temporary. These symptoms usually develop suddenly, or may be newly present upon waking from sleep:  Sudden weakness or numbness of the face, arm, or leg, especially on one side of the body.  Sudden trouble walking or difficulty moving arms or  legs.  Sudden confusion.  Sudden personality changes.  Trouble speaking (aphasia) or understanding.  Difficulty swallowing.  Sudden trouble seeing in one or both eyes.  Double vision.  Dizziness.  Loss of balance or coordination.  Sudden severe headache with no known cause.  Trouble reading or writing.  Loss of bowel or bladder control.  Loss of consciousness. DIAGNOSIS  Your health care provider may be able to determine the presence or absence of a TIA based on your symptoms, history, and physical exam. CT scan of the brain is usually performed to help identify a TIA. Other tests may include:  Electrocardiography (ECG).  Continuous heart monitoring.  Echocardiography.  Carotid ultrasonography.  MRI.  A scan of the brain circulation.  Blood tests. TREATMENT  Since the symptoms of TIA are the same as a stroke, it is important to seek treatment as soon as possible. You may need a medicine to dissolve a blood clot (thrombolytic) if that is the cause of the TIA. This medicine cannot be given if too much time has passed. Treatment may also include:   Rest, oxygen, fluids through an IV tube, and medicines to thin the blood (anticoagulants).  Measures will be taken to prevent short-term and long-term complications, including infection from breathing foreign material into the lungs (aspiration pneumonia), blood clots in the legs, and falls.  Procedures to either remove plaque in the carotid arteries or dilate carotid arteries that have narrowed due to plaque. Those procedures are:  Carotid endarterectomy.  Carotid angioplasty and stenting.  Medicines and diet may be used to address  diabetes, high blood pressure, and other underlying risk factors. HOME CARE INSTRUCTIONS   Take medicines only as directed by your health care provider. Follow the directions carefully. Medicines may be used to control risk factors for a stroke. Be sure you understand all your medicine  instructions.  You may be told to take aspirin or the anticoagulant warfarin. Warfarin needs to be taken exactly as instructed.  Taking too much or too little warfarin is dangerous. Too much warfarin increases the risk of bleeding. Too little warfarin continues to allow the risk for blood clots. While taking warfarin, you will need to have regular blood tests to measure your blood clotting time. A PT blood test measures how long it takes for blood to clot. Your PT is used to calculate another value called an INR. Your PT and INR help your health care provider to adjust your dose of warfarin. The dose can change for many reasons. It is critically important that you take warfarin exactly as prescribed.  Many foods, especially foods high in vitamin K can interfere with warfarin and affect the PT and INR. Foods high in vitamin K include spinach, kale, broccoli, cabbage, collard and turnip greens, Brussels sprouts, peas, cauliflower, seaweed, and parsley, as well as beef and pork liver, green tea, and soybean oil. You should eat a consistent amount of foods high in vitamin K. Avoid major changes in your diet, or notify your health care provider before changing your diet. Arrange a visit with a dietitian to answer your questions.  Many medicines can interfere with warfarin and affect the PT and INR. You must tell your health care provider about any and all medicines you take; this includes all vitamins and supplements. Be especially cautious with aspirin and anti-inflammatory medicines. Do not take or discontinue any prescribed or over-the-counter medicine except on the advice of your health care provider or pharmacist.  Warfarin can have side effects, such as excessive bruising or bleeding. You will need to hold pressure over cuts for longer than usual. Your health care provider or pharmacist will discuss other potential side effects.  Avoid sports or activities that may cause injury or bleeding.  Be  careful when shaving, flossing your teeth, or handling sharp objects.  Alcohol can change the body's ability to handle warfarin. It is best to avoid alcoholic drinks or consume only very small amounts while taking warfarin. Notify your health care provider if you change your alcohol intake.  Notify your dentist or other health care providers before procedures.  Eat a diet that includes 5 or more servings of fruits and vegetables each day. This may reduce the risk of stroke. Certain diets may be prescribed to address high blood pressure, high cholesterol, diabetes, or obesity.  A diet low in sodium, saturated fat, trans fat, and cholesterol is recommended to manage high blood pressure.  A diet low in saturated fat, trans fat, and cholesterol, and high in fiber may control cholesterol levels.  A controlled-carbohydrate, controlled-sugar diet is recommended to manage diabetes.  A reduced-calorie diet that is low in sodium, saturated fat, trans fat, and cholesterol is recommended to manage obesity.  Maintain a healthy weight.  Stay physically active. It is recommended that you get at least 30 minutes of activity on most or all days.  Do not use any tobacco products, including cigarettes, chewing tobacco, or electronic cigarettes. If you need help quitting, ask your health care provider.  Limit alcohol intake to no more than 1 drink per day for  nonpregnant women and 2 drinks per day for men. One drink equals 12 ounces of beer, 5 ounces of wine, or 1 ounces of hard liquor.  Do not abuse drugs.  A safe home environment is important to reduce the risk of falls. Your health care provider may arrange for specialists to evaluate your home. Having grab bars in the bedroom and bathroom is often important. Your health care provider may arrange for equipment to be used at home, such as raised toilets and a seat for the shower.  Follow all instructions for follow-up with your health care provider. This  is very important. This includes any referrals and lab tests. Proper follow-up can prevent a stroke or another TIA from occurring. PREVENTION  The risk of a TIA can be decreased by appropriately treating high blood pressure, high cholesterol, diabetes, heart disease, and obesity, and by quitting smoking, limiting alcohol, and staying physically active. SEEK MEDICAL CARE IF:  You have personality changes.  You have difficulty swallowing.  You are seeing double.  You have dizziness.  You have a fever. SEEK IMMEDIATE MEDICAL CARE IF:  Any of the following symptoms may represent a serious problem that is an emergency. Do not wait to see if the symptoms will go away. Get medical help right away. Call your local emergency services (911 in [place] Do not drive yourself to the hospital.  You have sudden weakness or numbness of the face, arm, or leg, especially on one side of the body.  You have sudden trouble walking or difficulty moving arms or legs.  You have sudden confusion.  You have trouble speaking (aphasia) or understanding.  You have sudden trouble seeing in one or both eyes.  You have a loss of balance or coordination.  You have a sudden, severe headache with no known cause.  You have new chest pain or an irregular heartbeat.  You have a partial or total loss of consciousness. MAKE SURE YOU:   Understand these instructions.  Will watch your condition.  Will get help right away if you are not doing well or get worse.   This information is not intended to replace advice given to you by your health care provider. Make sure you discuss any questions you have with your health care provider.   Document Released: 05/20/2005 Document Revised: 08/31/2014 Document Reviewed: 11/15/2013 Elsevier Interactive Patient Education [employer].

## 2015-10-22 NOTE — ED Notes (Signed)
Pt wentto bed normal at 10 pm last night and woke up and could not read wordsion TV, person bringing breakfast thought pt had slurred speech, pt has been off plavix x 5 days for tooth extraction today

## 2015-10-22 NOTE — ED Provider Notes (Signed)
CSN: [id number]     Arrival date & time 10/22/15  0920 History   First MD Initiated Contact with Patient 10/22/15 [phone]     Chief Complaint  Patient presents with  . Cerebrovascular Accident      HPI  Patient presents for evaluation of difficulty with her speech this morning.  Last time known normal was 10 PM last night upon going to bed. Patient awakened at 7:00 this morning with symptoms  Similar episode several years ago. States she had an MRI that was normal. His never been told that she has had stroke. Is uncertain if she has ever been told about TIA.  This morning she awakened and was watching TV. She states that she could see the words on the TV screen without visual difficulty. However, she states that she cannot understand what they meant. Apparently either family member another person that spoke with her felt like she was having difficulty understanding and speaking. Patient states that she relies she was having difficulty as well. This resolved within 1 hour's time. She is asymptomatic now.  She had a recent dental infection and is scheduled for a dental procedure today. Stopped her Plavix 5 days ago.  Past Medical History  Diagnosis Date  . Hypercholesteremia   . Hypertension   . Diabetes mellitus   . Stroke [facility])     reports TIA 3-4 weeks ago  . Anemia   . CAD (coronary artery disease)   . Peripheral vascular disease ([place])   . Cancer [facility])     Breast cancer   Past Surgical History  Procedure Laterality Date  . Abdominal hysterectomy    . Mastectomy    . Humerus fracture surgery    . Appendectomy  1951  . Nasal septum surgery  1977    Dr. Ernesto Rutherford  . [place] luc sinusotomy  [id number]  . Cholecystectomy  2003    By Dr. Excell Seltzer  . Fracture surgery Right     ORIF   by Dr. Burney Gauze  . Eye surgery Bilateral 2009    Cataract surgery  . Toenail excision     Family History  Problem Relation Age of Onset  . Cancer Mother   . Heart disease Father   . Heart attack  Father   . Diabetes Brother   . Heart disease Brother   . Hyperlipidemia Brother    Social History  Substance Use Topics  . Smoking status: Former Smoker    Quit date: 08/24/1978  . Smokeless tobacco: None  . Alcohol Use: No   OB History    No data available     Review of Systems  Constitutional: Negative for fever, chills, diaphoresis, appetite change and fatigue.  HENT: Negative for mouth sores, sore throat and trouble swallowing.   Eyes: Negative for visual disturbance.  Respiratory: Negative for cough, chest tightness, shortness of breath and wheezing.   Cardiovascular: Negative for chest pain.  Gastrointestinal: Negative for nausea, vomiting, abdominal pain, diarrhea and abdominal distention.  Endocrine: Negative for polydipsia, polyphagia and polyuria.  Genitourinary: Negative for dysuria, frequency and hematuria.  Musculoskeletal: Negative for gait problem.  Skin: Negative for color change, pallor and rash.  Neurological: Positive for speech difficulty. Negative for dizziness, syncope, light-headedness and headaches.       Difficulty understanding words. Difficulty expressing herself.  Hematological: Does not bruise/bleed easily.  Psychiatric/Behavioral: Negative for behavioral problems and confusion.      Allergies  Morphine and related; Other; and Sulfa antibiotics  Home Medications  Prior to Admission medications   Medication Sig Start Date End Date Taking? Authorizing Provider  ALPRAZolam Duanne Moron) 0.5 MG tablet Take 0.5 mg by mouth 3 (three) times daily as needed for anxiety.    Yes Historical Provider, MD  amLODipine (NORVASC) 10 MG tablet Take 10 mg by mouth daily.   Yes Historical Provider, MD  atenolol (TENORMIN) 50 MG tablet Take 100 mg by mouth daily.    Yes Historical Provider, MD  atorvastatin (LIPITOR) 80 MG tablet Take 80 mg by mouth at bedtime.    Yes Historical Provider, MD  BIOTIN PO Take 2 tablets by mouth daily.    Yes Historical Provider, MD   CALCIUM PO Take 1 tablet by mouth daily.    Yes Historical Provider, MD  Cholecalciferol (VITAMIN D) 2000 UNITS CAPS Take 1 capsule by mouth daily.   Yes Historical Provider, MD  clopidogrel (PLAVIX) 75 MG tablet Take 75 mg by mouth daily.   Yes Historical Provider, MD  Multiple Vitamin (MULTIVITAMIN WITH MINERALS) TABS tablet Take 1 tablet by mouth daily.   Yes Historical Provider, MD  omeprazole (PRILOSEC) 20 MG capsule Take 20 mg by mouth daily.   Yes Historical Provider, MD  ondansetron (ZOFRAN) 4 MG tablet Take 4 mg by mouth every 8 (eight) hours as needed for refractory nausea / vomiting.  01/25/15  Yes Historical Provider, MD  ramipril (ALTACE) 10 MG capsule Take 10 mg by mouth 2 (two) times daily.    Yes Historical Provider, MD  vitamin C (ASCORBIC ACID) 500 MG tablet Take 1,000 mg by mouth daily.    Yes Historical Provider, MD  b complex vitamins tablet Take 1 tablet by mouth daily.    Historical Provider, MD  FOLIC ACID PO Take 1 tablet by mouth daily.    Historical Provider, MD  Ginger, Zingiber officinalis, (GINGER ROOT) 550 MG CAPS Take 4 capsules by mouth daily.    Historical Provider, MD  L-LYSINE PO Take by mouth as needed.    Historical Provider, MD  mometasone (NASONEX) 50 MCG/ACT nasal spray Place 2 sprays into the nose daily.    Historical Provider, MD  sucralfate (CARAFATE) 1 G tablet  01/15/15   Historical Provider, MD   BP 190/87 mmHg  Pulse 60  Temp(Src) 97.8 F (36.6 C) (Oral)  Resp 22  SpO2 94% Physical Exam  Constitutional: She is oriented to person, place, and time. She appears well-developed and well-nourished. No distress.  HENT:  Head: Normocephalic.  Eyes: Conjunctivae are normal. Pupils are equal, round, and reactive to light. No scleral icterus.  Neck: Normal range of motion. Neck supple. No thyromegaly present.  Cardiovascular: Normal rate and regular rhythm.  Exam reveals no gallop and no friction rub.   No murmur heard. Pulmonary/Chest: Effort normal  and breath sounds normal. No respiratory distress. She has no wheezes. She has no rales.  Abdominal: Soft. Bowel sounds are normal. She exhibits no distension. There is no tenderness. There is no rebound.  Musculoskeletal: Normal range of motion.  Neurological: She is alert and oriented to person, place, and time.  Normal fluent speech. Intact symmetric cranial nerves. No pronator drift. No leg drift. No finger to nose and cerebellar function  Skin: Skin is warm and dry. No rash noted.  Psychiatric: She has a normal mood and affect. Her behavior is normal.    ED Course  Procedures (including critical care time) Labs Review Labs Reviewed  CBC WITH DIFFERENTIAL/PLATELET - Abnormal; Notable for the following:  RBC 5.21 (*)    Hemoglobin 15.2 (*)    All other components within normal limits  URINALYSIS, ROUTINE W REFLEX MICROSCOPIC (NOT AT [facility]) - Abnormal; Notable for the following:    Protein, ur 30 (*)    All other components within normal limits  COMPREHENSIVE METABOLIC PANEL - Abnormal; Notable for the following:    Chloride 97 (*)    Glucose, Bld 123 (*)    All other components within normal limits  URINE MICROSCOPIC-ADD ON - Abnormal; Notable for the following:    Squamous Epithelial / LPF 0-5 (*)    Bacteria, UA RARE (*)    All other components within normal limits    Imaging Review Mr Brain Wo Contrast  10/22/2015  CLINICAL DATA:  Patient awoke with slurred speech and visual difficulty. Recent discontinuation of Plavix for dental procedure. Expressive aphasia has resolved. History of diabetes and hypertension. History of previous stroke. EXAM: MRI HEAD WITHOUT CONTRAST TECHNIQUE: Multiplanar, multiecho pulse sequences of the brain and surrounding structures were obtained without intravenous contrast. COMPARISON:  03/08/2015. FINDINGS: No evidence for acute infarction, hemorrhage, mass lesion, hydrocephalus, or extra-axial fluid. Generalized atrophy. Mild to moderate T2 and  FLAIR hyperintensity throughout the white matter, likely chronic microvascular ischemic change. Scattered areas of chronic lacunar infarction, most notably LEFT basal ganglia. Ischemic demyelination is also observed in the pons. Flow voids are preserved in the BILATERAL vertebral, basilar, and BILATERAL skullbase internal carotid arteries. No MCA occlusion. No chronic hemorrhage. Mild pannus and slight tonsillar ectopia, stable. BILATERAL cataract extraction. No acute sinus or mastoid disease. Similar appearance to priors. IMPRESSION: No restricted diffusion to suggest acute stroke. Widespread areas of chronic ischemia. Atrophy and small vessel disease, similar to priors. Electronically Signed   By: Staci Righter M.D.   On: 10/22/2015 12:14   I have personally reviewed and evaluated these images and lab results as part of my medical decision-making.   EKG Interpretation None      MDM   Final diagnoses:  Transient cerebral ischemia, unspecified transient cerebral ischemia type     Patient describes receptive, and expressive aphasia. Asymptomatic in normal exam now. Plan MRI. She has been off of her Plavix for the last 5 days for a planned dental procedure.   14:15:  MRI normal. Labs reassuring. She is medically stable. Asymptomatic. Repeat neuro exam normal. Oriented lucid fluent.   Given Plavix 75 by mouth. Have asked her to follow up with discussions with her dentist and primary care physician regarding timing of any future procedures. I'm sent to have her continue off of her Plavix with her TIA this morning.    Tanna Furry, MD 10/22/15 1415

## 2015-10-22 NOTE — ED Notes (Signed)
Pt to MRI via stretcher.

## 2015-10-22 NOTE — ED Notes (Signed)
PO fluids given to pt after discussed with Dr. Jeneen Rinks.

## 2015-10-22 NOTE — ED Notes (Signed)
Millie - RN aware of pt's BP

## 2015-10-23 ENCOUNTER — Encounter (HOSPITAL_COMMUNITY): Payer: Self-pay

## 2015-10-23 ENCOUNTER — Emergency Department (HOSPITAL_COMMUNITY)
Admission: EM | Admit: 2015-10-23 | Discharge: 2015-10-23 | Disposition: A | Discharge status: Home / Self Care | Payer: Medicare Other | Attending: Emergency Medicine | Admitting: Emergency Medicine

## 2015-10-23 DIAGNOSIS — Z862 Personal history of diseases of the blood and blood-forming organs and certain disorders involving the immune mechanism: Secondary | ICD-10-CM | POA: Diagnosis not present

## 2015-10-23 DIAGNOSIS — E119 Type 2 diabetes mellitus without complications: Secondary | ICD-10-CM | POA: Insufficient documentation

## 2015-10-23 DIAGNOSIS — Z7951 Long term (current) use of inhaled steroids: Secondary | ICD-10-CM | POA: Insufficient documentation

## 2015-10-23 DIAGNOSIS — Z8673 Personal history of transient ischemic attack (TIA), and cerebral infarction without residual deficits: Secondary | ICD-10-CM | POA: Diagnosis not present

## 2015-10-23 DIAGNOSIS — Z853 Personal history of malignant neoplasm of breast: Secondary | ICD-10-CM | POA: Diagnosis not present

## 2015-10-23 DIAGNOSIS — G43809 Other migraine, not intractable, without status migrainosus: Secondary | ICD-10-CM | POA: Insufficient documentation

## 2015-10-23 DIAGNOSIS — E7800 Pure hypercholesterolemia, unspecified: Secondary | ICD-10-CM | POA: Insufficient documentation

## 2015-10-23 DIAGNOSIS — I2510 Atherosclerotic heart disease of native coronary artery without angina pectoris: Secondary | ICD-10-CM | POA: Diagnosis not present

## 2015-10-23 DIAGNOSIS — R4701 Aphasia: Secondary | ICD-10-CM | POA: Diagnosis present

## 2015-10-23 DIAGNOSIS — Z79899 Other long term (current) drug therapy: Secondary | ICD-10-CM | POA: Diagnosis not present

## 2015-10-23 DIAGNOSIS — Z7902 Long term (current) use of antithrombotics/antiplatelets: Secondary | ICD-10-CM | POA: Diagnosis not present

## 2015-10-23 DIAGNOSIS — Z87891 Personal history of nicotine dependence: Secondary | ICD-10-CM | POA: Insufficient documentation

## 2015-10-23 DIAGNOSIS — I10 Essential (primary) hypertension: Secondary | ICD-10-CM | POA: Insufficient documentation

## 2015-10-23 MED ORDER — DIVALPROEX SODIUM 125 MG PO DR TAB: 125.0000 mg | DELAYED_RELEASE_TABLET | Freq: Three times a day (TID) | ORAL | Status: DC

## 2015-10-23 MED ORDER — DIVALPROEX SODIUM 125 MG PO CSDR
125.0000 mg | DELAYED_RELEASE_CAPSULE | Freq: Once | ORAL | Status: AC
Start: 2015-10-23 — End: 2015-10-23
  Administered 2015-10-23: 125 mg via ORAL
  Filled 2015-10-23: qty 1

## 2015-10-23 MED ORDER — DIVALPROEX SODIUM 125 MG PO DR TAB
125.0000 mg | DELAYED_RELEASE_TABLET | Freq: Once | ORAL | Status: DC
  Filled 2015-10-23: qty 1

## 2015-10-23 NOTE — ED Notes (Signed)
Pt c/o expressive aphasia x around 2 hours earlier today and headache starting this morning.  Pain score 5/10.  Pt has not taken anything for the pain.  Aphasia symptoms have resolved.  Pt was seen at [facility] after having a similar episode yesterday.  MRI completed and negative.  Pt reports that she restarted Plavix yesterday.  Sts she was originally started on Plavix "years ago, due to a TIA."

## 2015-10-23 NOTE — Discharge Instructions (Signed)
Continue your plavix. Start Depakote am and pm, first dose in the morning. You'll need repeat lab testing to check your liver function and platelets within 3 weeks on the new medication. Call [facility] for follow-up appointment.     Migraine Headache A migraine headache is an intense, throbbing pain on one or both sides of your head. A migraine can last for 30 minutes to several hours. CAUSES  The exact cause of a migraine headache is not always known. However, a migraine may be caused when nerves in the brain become irritated and release chemicals that cause inflammation. This causes pain. Certain things may also trigger migraines, such as:  Alcohol.  Smoking.  Stress.  Menstruation.  Aged cheeses.  Foods or drinks that contain nitrates, glutamate, aspartame, or tyramine.  Lack of sleep.  Chocolate.  Caffeine.  Hunger.  Physical exertion.  Fatigue.  Medicines used to treat chest pain (nitroglycerine), birth control pills, estrogen, and some blood pressure medicines. SIGNS AND SYMPTOMS  Pain on one or both sides of your head.  Pulsating or throbbing pain.  Severe pain that prevents daily activities.  Pain that is aggravated by any physical activity.  Nausea, vomiting, or both.  Dizziness.  Pain with exposure to bright lights, loud noises, or activity.  General sensitivity to bright lights, loud noises, or smells. Before you get a migraine, you may get warning signs that a migraine is coming (aura). An aura may include:  Seeing flashing lights.  Seeing bright spots, halos, or zigzag lines.  Having tunnel vision or blurred vision.  Having feelings of numbness or tingling.  Having trouble talking.  Having muscle weakness. DIAGNOSIS  A migraine headache is often diagnosed based on:  Symptoms.  Physical exam.  A CT scan or MRI of your head. These imaging tests cannot diagnose migraines, but they can help rule out other causes of  headaches. TREATMENT Medicines may be given for pain and nausea. Medicines can also be given to help prevent recurrent migraines.  HOME CARE INSTRUCTIONS  Only take over-the-counter or prescription medicines for pain or discomfort as directed by your health care provider. The use of long-term narcotics is not recommended.  Lie down in a dark, quiet room when you have a migraine.  Keep a journal to find out what may trigger your migraine headaches. For example, write down:  What you eat and drink.  How much sleep you get.  Any change to your diet or medicines.  Limit alcohol consumption.  Quit smoking if you smoke.  Get 7-9 hours of sleep, or as recommended by your health care provider.  Limit stress.  Keep lights dim if bright lights bother you and make your migraines worse. SEEK IMMEDIATE MEDICAL CARE IF:   Your migraine becomes severe.  You have a fever.  You have a stiff neck.  You have vision loss.  You have muscular weakness or loss of muscle control.  You start losing your balance or have trouble walking.  You feel faint or pass out.  You have severe symptoms that are different from your first symptoms. MAKE SURE YOU:   Understand these instructions.  Will watch your condition.  Will get help right away if you are not doing well or get worse.   This information is not intended to replace advice given to you by your health care provider. Make sure you discuss any questions you have with your health care provider.   Document Released: 08/10/2005 Document Revised: 08/31/2014 Document Reviewed:  04/17/2013 Elsevier Interactive Patient Education [employer].

## 2015-10-23 NOTE — ED Provider Notes (Signed)
CSN: [id number]     Arrival date & time 10/23/15  1352 History   First MD Initiated Contact with Patient 10/23/15 1502     Chief Complaint  Patient presents with  . Expressive Aphasia       HPI  She presents for evaluation of difficulty speaking and a headache.  Seen by myself yesterday at [facility]. Similar presentation with a receptive and expressive aphasia. No headache at that time however. Normal evaluation including normal MRI. Asymptomatic upon her arrival to the emergency room then.  Patient states that today she went to mass to have her rashes placed rash Wednesday. She developed left-sided headache. She states she checked her blood pressure and it was high. States was 170 on the top number. That she was nervous and anxious. Again tried reading the close On Her Television Had Difficulty Understanding and Saying the Words. Called Her Daughter. Her Daughter Came over to Get Her. She States Her Mother Was Very Nervous. That Was Fluent with Her Speech. Complaining of a Headache for 5 Out Of 10.  Patient had normal echocardiogram in 2012. Has had serial Doppler ultrasounds were carotids. Most recently saw Dr. Bridgett Larsson of vascular surgery and had less than 70% on both carotids. His opinion expressed in the chart was at her previous symptom had likely not an TIA related to carotid disease. He question possible migraine. Patient states now she does remember that her ophthalmologist suggested some previous vision changes may have ocular migraines as well.  Past Medical History  Diagnosis Date  . Hypercholesteremia   . Hypertension   . Diabetes mellitus   . Stroke [facility])     reports TIA 3-4 weeks ago  . Anemia   . CAD (coronary artery disease)   . Peripheral vascular disease ([place])   . Cancer [facility])     Breast cancer   Past Surgical History  Procedure Laterality Date  . Abdominal hysterectomy    . Mastectomy    . Humerus fracture surgery    . Appendectomy  1951  . Nasal septum surgery   1977    Dr. Ernesto Rutherford  . [place] luc sinusotomy  [id number]  . Cholecystectomy  2003    By Dr. Excell Seltzer  . Fracture surgery Right     ORIF   by Dr. Burney Gauze  . Eye surgery Bilateral 2009    Cataract surgery  . Toenail excision     Family History  Problem Relation Age of Onset  . Cancer Mother   . Heart disease Father   . Heart attack Father   . Diabetes Brother   . Heart disease Brother   . Hyperlipidemia Brother    Social History  Substance Use Topics  . Smoking status: Former Smoker    Quit date: 08/24/1978  . Smokeless tobacco: None  . Alcohol Use: No   OB History    No data available     Review of Systems  Constitutional: Negative for fever, chills, diaphoresis, appetite change and fatigue.  HENT: Negative for mouth sores, sore throat and trouble swallowing.   Eyes: Negative for visual disturbance.  Respiratory: Negative for cough, chest tightness, shortness of breath and wheezing.   Cardiovascular: Negative for chest pain.  Gastrointestinal: Negative for nausea, vomiting, abdominal pain, diarrhea and abdominal distention.  Endocrine: Negative for polydipsia, polyphagia and polyuria.  Genitourinary: Negative for dysuria, frequency and hematuria.  Musculoskeletal: Negative for gait problem.  Skin: Negative for color change, pallor and rash.  Neurological: Positive for speech difficulty  and headaches. Negative for dizziness, syncope and light-headedness.  Hematological: Does not bruise/bleed easily.  Psychiatric/Behavioral: Negative for behavioral problems and confusion.      Allergies  Morphine and related; Other; and Sulfa antibiotics  Home Medications   Prior to Admission medications   Medication Sig Start Date End Date Taking? Authorizing Provider  ALPRAZolam Duanne Moron) 0.5 MG tablet Take 0.5 mg by mouth 4 (four) times daily as needed for anxiety.    Yes Historical Provider, MD  atenolol (TENORMIN) 50 MG tablet Take 100 mg by mouth daily.    Yes Historical  Provider, MD  atorvastatin (LIPITOR) 80 MG tablet Take 80 mg by mouth at bedtime.    Yes Historical Provider, MD  BIOTIN PO Take 2 tablets by mouth daily.    Yes Historical Provider, MD  CALCIUM PO Take 1 tablet by mouth daily.    Yes Historical Provider, MD  Cholecalciferol (VITAMIN D) 2000 UNITS CAPS Take 1 capsule by mouth daily.   Yes Historical Provider, MD  clopidogrel (PLAVIX) 75 MG tablet Take 75 mg by mouth daily.   Yes Historical Provider, MD  diphenhydrAMINE (BENADRYL) 25 MG tablet Take 25-50 mg by mouth at bedtime as needed for allergies.   Yes Historical Provider, MD  mometasone (NASONEX) 50 MCG/ACT nasal spray Place 2 sprays into the nose daily.   Yes Historical Provider, MD  Multiple Vitamin (MULTIVITAMIN WITH MINERALS) TABS tablet Take 1 tablet by mouth every other day.    Yes Historical Provider, MD  omeprazole (PRILOSEC) 20 MG capsule Take 20 mg by mouth daily.   Yes Historical Provider, MD  ondansetron (ZOFRAN) 4 MG tablet Take 4 mg by mouth every 8 (eight) hours as needed for refractory nausea / vomiting.  01/25/15  Yes Historical Provider, MD  ramipril (ALTACE) 10 MG capsule Take 20 mg by mouth daily.    Yes Historical Provider, MD  vitamin C (ASCORBIC ACID) 500 MG tablet Take 1,000 mg by mouth daily.    Yes Historical Provider, MD  divalproex (DEPAKOTE) 125 MG DR tablet Take 1 tablet (125 mg total) by mouth 3 (three) times daily. 10/23/15   Tanna Furry, MD  divalproex (DEPAKOTE) 125 MG DR tablet Take 1 tablet (125 mg total) by mouth 3 (three) times daily. 10/23/15   Tanna Furry, MD   BP 196/97 mmHg  Pulse 74  Temp(Src) 97.7 F (36.5 C) (Oral)  Resp 18  SpO2 97% Physical Exam  Constitutional: She is oriented to person, place, and time. She appears well-developed and well-nourished. No distress.  HENT:  Head: Normocephalic.  Eyes: Conjunctivae are normal. Pupils are equal, round, and reactive to light. No scleral icterus.  Neck: Normal range of motion. Neck supple. No  thyromegaly present.  Cardiovascular: Normal rate and regular rhythm.  Exam reveals no gallop and no friction rub.   No murmur heard. Pulmonary/Chest: Effort normal and breath sounds normal. No respiratory distress. She has no wheezes. She has no rales.  Abdominal: Soft. Bowel sounds are normal. She exhibits no distension. There is no tenderness. There is no rebound.  Musculoskeletal: Normal range of motion.  Neurological: She is alert and oriented to person, place, and time.  Skin: Skin is warm and dry. No rash noted.  Psychiatric: She has a normal mood and affect. Her behavior is normal.    ED Course  Procedures (including critical care time) Labs Review Labs Reviewed - No data to display  Imaging Review Mr Brain Wo Contrast  10/22/2015  CLINICAL DATA:  Patient  awoke with slurred speech and visual difficulty. Recent discontinuation of Plavix for dental procedure. Expressive aphasia has resolved. History of diabetes and hypertension. History of previous stroke. EXAM: MRI HEAD WITHOUT CONTRAST TECHNIQUE: Multiplanar, multiecho pulse sequences of the brain and surrounding structures were obtained without intravenous contrast. COMPARISON:  03/08/2015. FINDINGS: No evidence for acute infarction, hemorrhage, mass lesion, hydrocephalus, or extra-axial fluid. Generalized atrophy. Mild to moderate T2 and FLAIR hyperintensity throughout the white matter, likely chronic microvascular ischemic change. Scattered areas of chronic lacunar infarction, most notably LEFT basal ganglia. Ischemic demyelination is also observed in the pons. Flow voids are preserved in the BILATERAL vertebral, basilar, and BILATERAL skullbase internal carotid arteries. No MCA occlusion. No chronic hemorrhage. Mild pannus and slight tonsillar ectopia, stable. BILATERAL cataract extraction. No acute sinus or mastoid disease. Similar appearance to priors. IMPRESSION: No restricted diffusion to suggest acute stroke. Widespread areas of  chronic ischemia. Atrophy and small vessel disease, similar to priors. Electronically Signed   By: Staci Righter M.D.   On: 10/22/2015 12:14   I have personally reviewed and evaluated these images and lab results as part of my medical decision-making.   EKG Interpretation None      MDM   Final diagnoses:  Other migraine without status migrainosus, not intractable    Patient symptomatically here. Discussed the case with neurology on-call. He was in agreement with the diagnosis of probable vascular migraine headache. He recommended Depakote. We will start with low dose, 125 mg twice a day. I've asked her to see her primary care physician for repeat liver function platelet within the next 3 weeks. Outpatient neurology referral. [facility] with any acute or recurring symptoms.    Tanna Furry, MD 10/23/15 1622

## 2015-11-05 DIAGNOSIS — L84 Corns and callosities: Secondary | ICD-10-CM | POA: Diagnosis not present

## 2015-11-05 DIAGNOSIS — L602 Onychogryphosis: Secondary | ICD-10-CM | POA: Diagnosis not present

## 2015-11-05 DIAGNOSIS — I70293 Other atherosclerosis of native arteries of extremities, bilateral legs: Secondary | ICD-10-CM | POA: Diagnosis not present

## 2015-11-05 DIAGNOSIS — E1351 Other specified diabetes mellitus with diabetic peripheral angiopathy without gangrene: Secondary | ICD-10-CM | POA: Diagnosis not present

## 2015-11-14 ENCOUNTER — Ambulatory Visit: Payer: Self-pay | Admitting: Neurology

## 2015-11-15 DIAGNOSIS — G43909 Migraine, unspecified, not intractable, without status migrainosus: Secondary | ICD-10-CM | POA: Diagnosis not present

## 2015-11-15 DIAGNOSIS — I10 Essential (primary) hypertension: Secondary | ICD-10-CM | POA: Diagnosis not present

## 2015-11-15 DIAGNOSIS — I639 Cerebral infarction, unspecified: Secondary | ICD-10-CM | POA: Diagnosis not present

## 2015-11-15 DIAGNOSIS — N183 Chronic kidney disease, stage 3 (moderate): Secondary | ICD-10-CM | POA: Diagnosis not present

## 2015-11-15 DIAGNOSIS — F411 Generalized anxiety disorder: Secondary | ICD-10-CM | POA: Diagnosis not present

## 2015-11-15 DIAGNOSIS — R110 Nausea: Secondary | ICD-10-CM | POA: Diagnosis not present

## 2015-11-15 DIAGNOSIS — E782 Mixed hyperlipidemia: Secondary | ICD-10-CM | POA: Diagnosis not present

## 2015-11-15 DIAGNOSIS — Z79899 Other long term (current) drug therapy: Secondary | ICD-10-CM | POA: Diagnosis not present

## 2015-12-13 ENCOUNTER — Emergency Department (HOSPITAL_COMMUNITY): Payer: Medicare Other

## 2015-12-13 ENCOUNTER — Inpatient Hospital Stay (HOSPITAL_COMMUNITY): Payer: Medicare Other

## 2015-12-13 ENCOUNTER — Inpatient Hospital Stay (HOSPITAL_COMMUNITY)
Admission: EM | Admit: 2015-12-13 | Discharge: 2015-12-18 | DRG: 643 | Disposition: A | Discharge status: Skilled Nursing Facility | Payer: Medicare Other | Attending: Internal Medicine | Admitting: Internal Medicine

## 2015-12-13 ENCOUNTER — Encounter (HOSPITAL_COMMUNITY): Payer: Self-pay | Admitting: Neurology

## 2015-12-13 DIAGNOSIS — R339 Retention of urine, unspecified: Secondary | ICD-10-CM | POA: Diagnosis present

## 2015-12-13 DIAGNOSIS — E222 Syndrome of inappropriate secretion of antidiuretic hormone: Principal | ICD-10-CM | POA: Diagnosis present

## 2015-12-13 DIAGNOSIS — E871 Hypo-osmolality and hyponatremia: Secondary | ICD-10-CM | POA: Diagnosis not present

## 2015-12-13 DIAGNOSIS — Z79899 Other long term (current) drug therapy: Secondary | ICD-10-CM | POA: Diagnosis not present

## 2015-12-13 DIAGNOSIS — T43225A Adverse effect of selective serotonin reuptake inhibitors, initial encounter: Secondary | ICD-10-CM | POA: Diagnosis present

## 2015-12-13 DIAGNOSIS — I6523 Occlusion and stenosis of bilateral carotid arteries: Secondary | ICD-10-CM | POA: Diagnosis present

## 2015-12-13 DIAGNOSIS — F329 Major depressive disorder, single episode, unspecified: Secondary | ICD-10-CM | POA: Diagnosis present

## 2015-12-13 DIAGNOSIS — E119 Type 2 diabetes mellitus without complications: Secondary | ICD-10-CM

## 2015-12-13 DIAGNOSIS — I10 Essential (primary) hypertension: Secondary | ICD-10-CM | POA: Diagnosis present

## 2015-12-13 DIAGNOSIS — M6281 Muscle weakness (generalized): Secondary | ICD-10-CM | POA: Diagnosis not present

## 2015-12-13 DIAGNOSIS — I739 Peripheral vascular disease, unspecified: Secondary | ICD-10-CM

## 2015-12-13 DIAGNOSIS — I639 Cerebral infarction, unspecified: Secondary | ICD-10-CM | POA: Diagnosis not present

## 2015-12-13 DIAGNOSIS — N1370 Vesicoureteral-reflux, unspecified: Secondary | ICD-10-CM | POA: Diagnosis present

## 2015-12-13 DIAGNOSIS — E1151 Type 2 diabetes mellitus with diabetic peripheral angiopathy without gangrene: Secondary | ICD-10-CM | POA: Diagnosis present

## 2015-12-13 DIAGNOSIS — I2510 Atherosclerotic heart disease of native coronary artery without angina pectoris: Secondary | ICD-10-CM | POA: Diagnosis present

## 2015-12-13 DIAGNOSIS — I6529 Occlusion and stenosis of unspecified carotid artery: Secondary | ICD-10-CM | POA: Diagnosis present

## 2015-12-13 DIAGNOSIS — E785 Hyperlipidemia, unspecified: Secondary | ICD-10-CM | POA: Diagnosis present

## 2015-12-13 DIAGNOSIS — Z7902 Long term (current) use of antithrombotics/antiplatelets: Secondary | ICD-10-CM | POA: Diagnosis not present

## 2015-12-13 DIAGNOSIS — Z87891 Personal history of nicotine dependence: Secondary | ICD-10-CM | POA: Diagnosis not present

## 2015-12-13 DIAGNOSIS — G92 Toxic encephalopathy: Secondary | ICD-10-CM | POA: Diagnosis present

## 2015-12-13 DIAGNOSIS — R4701 Aphasia: Secondary | ICD-10-CM | POA: Diagnosis present

## 2015-12-13 DIAGNOSIS — J449 Chronic obstructive pulmonary disease, unspecified: Secondary | ICD-10-CM | POA: Diagnosis not present

## 2015-12-13 DIAGNOSIS — N390 Urinary tract infection, site not specified: Secondary | ICD-10-CM | POA: Diagnosis present

## 2015-12-13 DIAGNOSIS — E878 Other disorders of electrolyte and fluid balance, not elsewhere classified: Secondary | ICD-10-CM | POA: Diagnosis not present

## 2015-12-13 DIAGNOSIS — Z853 Personal history of malignant neoplasm of breast: Secondary | ICD-10-CM

## 2015-12-13 DIAGNOSIS — G9340 Encephalopathy, unspecified: Secondary | ICD-10-CM | POA: Diagnosis not present

## 2015-12-13 DIAGNOSIS — E876 Hypokalemia: Secondary | ICD-10-CM | POA: Diagnosis present

## 2015-12-13 DIAGNOSIS — T426X5A Adverse effect of other antiepileptic and sedative-hypnotic drugs, initial encounter: Secondary | ICD-10-CM | POA: Diagnosis present

## 2015-12-13 DIAGNOSIS — Z8673 Personal history of transient ischemic attack (TIA), and cerebral infarction without residual deficits: Secondary | ICD-10-CM | POA: Diagnosis not present

## 2015-12-13 DIAGNOSIS — R404 Transient alteration of awareness: Secondary | ICD-10-CM | POA: Diagnosis not present

## 2015-12-13 DIAGNOSIS — T464X5A Adverse effect of angiotensin-converting-enzyme inhibitors, initial encounter: Secondary | ICD-10-CM | POA: Diagnosis present

## 2015-12-13 DIAGNOSIS — F419 Anxiety disorder, unspecified: Secondary | ICD-10-CM | POA: Diagnosis not present

## 2015-12-13 DIAGNOSIS — R4182 Altered mental status, unspecified: Secondary | ICD-10-CM | POA: Diagnosis not present

## 2015-12-13 DIAGNOSIS — G4700 Insomnia, unspecified: Secondary | ICD-10-CM | POA: Diagnosis not present

## 2015-12-13 DIAGNOSIS — R2681 Unsteadiness on feet: Secondary | ICD-10-CM | POA: Diagnosis not present

## 2015-12-13 DIAGNOSIS — D72829 Elevated white blood cell count, unspecified: Secondary | ICD-10-CM | POA: Diagnosis present

## 2015-12-13 DIAGNOSIS — K219 Gastro-esophageal reflux disease without esophagitis: Secondary | ICD-10-CM | POA: Diagnosis present

## 2015-12-13 DIAGNOSIS — E7800 Pure hypercholesterolemia, unspecified: Secondary | ICD-10-CM | POA: Diagnosis present

## 2015-12-13 DIAGNOSIS — F339 Major depressive disorder, recurrent, unspecified: Secondary | ICD-10-CM | POA: Diagnosis not present

## 2015-12-13 DIAGNOSIS — R42 Dizziness and giddiness: Secondary | ICD-10-CM | POA: Diagnosis not present

## 2015-12-13 HISTORY — DX: Urinary tract infection, site not specified: N39.0

## 2015-12-13 LAB — BASIC METABOLIC PANEL
Anion gap: 15 (ref 5–15)
BUN: 23 mg/dL — ABNORMAL HIGH (ref 6–20)
CO2: 28 mmol/L (ref 22–32)
Calcium: 9.1 mg/dL (ref 8.9–10.3)
Chloride: 72 mmol/L — ABNORMAL LOW (ref 101–111)
Creatinine, Ser: 0.82 mg/dL (ref 0.44–1.00)
GFR calc Af Amer: >60 mL/min (ref >60)
GFR calc non Af Amer: >60 mL/min (ref >60)
Glucose, Bld: 129 mg/dL — ABNORMAL HIGH (ref 65–99)
Potassium: 3.2 mmol/L — ABNORMAL LOW (ref 3.5–5.1)
Sodium: 115 mmol/L — CL (ref 135–145)

## 2015-12-13 LAB — APTT: aPTT: 28 seconds (ref 24–37)

## 2015-12-13 LAB — OSMOLALITY, URINE: Osmolality, Ur: 443 mOsm/kg (ref 300–900)

## 2015-12-13 LAB — URINALYSIS, ROUTINE W REFLEX MICROSCOPIC
Bilirubin Urine: NEGATIVE
Glucose, UA: NEGATIVE mg/dL
Ketones, ur: 15 mg/dL — AB
Nitrite: NEGATIVE
Protein, ur: 100 mg/dL — AB
Specific Gravity, Urine: 1.016 (ref 1.005–1.030)
pH: 6.5 (ref 5.0–8.0)

## 2015-12-13 LAB — I-STAT TROPONIN, ED: Troponin i, poc: 0.03 ng/mL (ref 0.00–0.08)

## 2015-12-13 LAB — RAPID URINE DRUG SCREEN, HOSP PERFORMED
Amphetamines: NOT DETECTED
Barbiturates: NOT DETECTED
Benzodiazepines: NOT DETECTED
Cocaine: NOT DETECTED
Opiates: NOT DETECTED
Tetrahydrocannabinol: NOT DETECTED

## 2015-12-13 LAB — URINE MICROSCOPIC-ADD ON

## 2015-12-13 LAB — CBC
HCT: 40.5 % (ref 36.0–46.0)
Hemoglobin: 14.6 g/dL (ref 12.0–15.0)
MCH: 29 pg (ref 26.0–34.0)
MCHC: 36 g/dL (ref 30.0–36.0)
MCV: 80.4 fL (ref 78.0–100.0)
Platelets: 253 10*3/uL (ref 150–400)
RBC: 5.04 MIL/uL (ref 3.87–5.11)
RDW: 14.2 % (ref 11.5–15.5)
WBC: 17.7 10*3/uL — ABNORMAL HIGH (ref 4.0–10.5)

## 2015-12-13 LAB — PROTIME-INR
INR: 1.05 (ref 0.00–1.49)
Prothrombin Time: 13.9 seconds (ref 11.6–15.2)

## 2015-12-13 LAB — ETHANOL: Alcohol, Ethyl (B): <5 mg/dL (ref <5)

## 2015-12-13 LAB — VALPROIC ACID LEVEL: Valproic Acid Lvl: <10 ug/mL — ABNORMAL LOW (ref 50.0–100.0)

## 2015-12-13 LAB — OSMOLALITY: Osmolality: 252 mOsm/kg — ABNORMAL LOW (ref 275–295)

## 2015-12-13 LAB — SODIUM, URINE, RANDOM: Sodium, Ur: 18 mmol/L

## 2015-12-13 MED ORDER — ACETAMINOPHEN 325 MG PO TABS
650.0000 mg | ORAL_TABLET | Freq: Four times a day (QID) | ORAL | Status: DC | PRN
  Administered 2015-12-18: 650 mg via ORAL
  Filled 2015-12-13: qty 2

## 2015-12-13 MED ORDER — ATORVASTATIN CALCIUM 80 MG PO TABS
80.0000 mg | ORAL_TABLET | Freq: Every day | ORAL | Status: DC
  Administered 2015-12-14 – 2015-12-17 (×4): 80 mg via ORAL
  Filled 2015-12-13 (×4): qty 1

## 2015-12-13 MED ORDER — PANTOPRAZOLE SODIUM 40 MG PO TBEC
40.0000 mg | DELAYED_RELEASE_TABLET | Freq: Every day | ORAL | Status: DC
  Administered 2015-12-14 – 2015-12-18 (×5): 40 mg via ORAL
  Filled 2015-12-13 (×5): qty 1

## 2015-12-13 MED ORDER — CLOPIDOGREL BISULFATE 75 MG PO TABS: 75.0000 mg | ORAL_TABLET | Freq: Every day | ORAL | Status: DC

## 2015-12-13 MED ORDER — ONDANSETRON HCL 4 MG/2ML IJ SOLN: 4.0000 mg | Freq: Four times a day (QID) | INTRAMUSCULAR | Status: DC | PRN

## 2015-12-13 MED ORDER — CLOPIDOGREL BISULFATE 75 MG PO TABS
75.0000 mg | ORAL_TABLET | Freq: Every day | ORAL | Status: DC
  Administered 2015-12-14 – 2015-12-18 (×5): 75 mg via ORAL
  Filled 2015-12-13 (×5): qty 1

## 2015-12-13 MED ORDER — ADULT MULTIVITAMIN W/MINERALS CH
1.0000 | ORAL_TABLET | Freq: Every day | ORAL | Status: DC
  Administered 2015-12-14 – 2015-12-18 (×5): 1 via ORAL
  Filled 2015-12-13 (×5): qty 1

## 2015-12-13 MED ORDER — ATENOLOL 100 MG PO TABS: 100.0000 mg | ORAL_TABLET | Freq: Every day | ORAL | Status: DC

## 2015-12-13 MED ORDER — SODIUM CHLORIDE 0.9 % IV SOLN
Freq: Once | INTRAVENOUS | Status: AC
  Administered 2015-12-13: 20:00:00 via INTRAVENOUS

## 2015-12-13 MED ORDER — ONDANSETRON HCL 4 MG PO TABS: 4.0000 mg | ORAL_TABLET | Freq: Four times a day (QID) | ORAL | Status: DC | PRN

## 2015-12-13 MED ORDER — ATENOLOL 100 MG PO TABS
100.0000 mg | ORAL_TABLET | Freq: Every day | ORAL | Status: DC
  Administered 2015-12-14 – 2015-12-18 (×5): 100 mg via ORAL
  Filled 2015-12-13 (×5): qty 1

## 2015-12-13 MED ORDER — POTASSIUM CHLORIDE 20 MEQ/15ML (10%) PO SOLN
20.0000 meq | Freq: Once | ORAL | Status: AC
  Administered 2015-12-13: 20 meq via ORAL
  Filled 2015-12-13: qty 15

## 2015-12-13 MED ORDER — VITAMIN D 1000 UNITS PO TABS
2000.0000 [IU] | ORAL_TABLET | Freq: Every day | ORAL | Status: DC
  Administered 2015-12-14 – 2015-12-18 (×5): 2000 [IU] via ORAL
  Filled 2015-12-13 (×5): qty 2

## 2015-12-13 MED ORDER — BISMUTH SUBSALICYLATE 262 MG/15ML PO SUSP
30.0000 mL | Freq: Four times a day (QID) | ORAL | Status: DC | PRN
  Filled 2015-12-13: qty 118

## 2015-12-13 MED ORDER — ENOXAPARIN SODIUM 40 MG/0.4ML ~~LOC~~ SOLN
40.0000 mg | Freq: Every day | SUBCUTANEOUS | Status: DC
  Administered 2015-12-14 – 2015-12-17 (×5): 40 mg via SUBCUTANEOUS
  Filled 2015-12-13 (×5): qty 0.4

## 2015-12-13 MED ORDER — CALCIUM 500 MG PO TABS: 900.0000 mg | ORAL_TABLET | Freq: Every day | ORAL | Status: DC

## 2015-12-13 MED ORDER — SODIUM CHLORIDE 0.9% FLUSH
3.0000 mL | Freq: Two times a day (BID) | INTRAVENOUS | Status: DC
  Administered 2015-12-14 – 2015-12-18 (×7): 3 mL via INTRAVENOUS

## 2015-12-13 MED ORDER — SERTRALINE HCL 50 MG PO TABS: 25.0000 mg | ORAL_TABLET | Freq: Every day | ORAL | Status: DC

## 2015-12-13 MED ORDER — SODIUM CHLORIDE 0.9 % IV BOLUS (SEPSIS)
1000.0000 mL | Freq: Once | INTRAVENOUS | Status: AC
  Administered 2015-12-13: 1000 mL via INTRAVENOUS

## 2015-12-13 MED ORDER — CALCIUM CARBONATE 1250 (500 CA) MG PO TABS
1000.0000 mg | ORAL_TABLET | Freq: Every day | ORAL | Status: DC
  Administered 2015-12-14 – 2015-12-18 (×5): 1000 mg via ORAL
  Filled 2015-12-13 (×7): qty 1

## 2015-12-13 MED ORDER — SODIUM CHLORIDE 0.9 % IV SOLN
INTRAVENOUS | Status: DC
  Administered 2015-12-13: 21:00:00 via INTRAVENOUS

## 2015-12-13 MED ORDER — ACETAMINOPHEN 650 MG RE SUPP: 650.0000 mg | Freq: Four times a day (QID) | RECTAL | Status: DC | PRN

## 2015-12-13 MED ORDER — DIVALPROEX SODIUM 125 MG PO DR TAB: 125.0000 mg | DELAYED_RELEASE_TABLET | Freq: Two times a day (BID) | ORAL | Status: DC

## 2015-12-13 MED ORDER — POTASSIUM CHLORIDE 10 MEQ/100ML IV SOLN
10.0000 meq | INTRAVENOUS | Status: AC
  Administered 2015-12-13 (×2): 10 meq via INTRAVENOUS
  Filled 2015-12-13 (×2): qty 100

## 2015-12-13 MED ORDER — HYDRALAZINE HCL 20 MG/ML IJ SOLN
5.0000 mg | INTRAMUSCULAR | Status: DC | PRN
  Administered 2015-12-13: 5 mg via INTRAVENOUS
  Filled 2015-12-13: qty 1

## 2015-12-13 NOTE — H&P (Addendum)
History and Physical    Helen [name] [id number] DOB: [birth year] DOA: 12/13/2015  Referring MD/NP/PA:   PCP: [name], MD   Outpatient Specialists: Vascular surgeon, Dr. Bridgett Larsson  Patient coming from:  Home   Chief Complaint: AMS  HPI: Helen [name] is a 82 y.o. female with medical history significant of hypertension, hyperlipidemia, diet-controlled diabetes, GERD, stroke, CAD, PVD, right upper stenosis, breast cancer (s/p of mastectomy), migraine headache, who presents with AMS.  Per patient's daughter, patient becomes confused and has poor balance. She may also have transient expressive aphasia at about 4:00 to 5:00 PM. Per her daughters, patient was walking with assistance when she almost collapsed. Daughter states that she was awake and trying to speak, but that it was unintelligible. Symptoms lasted roughly 5 minutes. There was no trauma. Patient did not hit her head or neck.She does not have unilateral weakness, tingling sensations, vision change or hearing loss. She did not have seizure activities. Patient was seen by her PCP today, and was given one gram of Rocephin intramuscularly injection due to leukocytosis. Patient had negative chest x-ray and negative urinalysis for UTI in PCPs office. She was given prescription of Levaquin, but patient has not started taking this medication yet. Patient does not have respiratory symptoms, no cough, shortness of breath, chest pain, fever or chills. No dysuria, burning urination or frequency. Patient does not have nausea, vomiting, diarrhea or abdominal pain. She had tooth pain two month ago, which has completely resolved. Daughter states that the patient had one episode of transient right hand tingling recently, but could not provide more detailed information.  ED Course: pt was found to have leukocytosis with WBC 17.7, INR 1.05, PTT 28, negative troponin, temperature normal, no tachycardia, potassium 3.2, sodium 115, creatinine normal,  negative CT head for acute intracranial abnormalities. Patient's admitted to inpatient for further eval and treatment.  Can patient participate in ADLs?  Little  Review of Systems:  Could not be reviewed and accurately due to altered mental status  Allergy:  Allergies  Allergen Reactions  . Morphine And Related Nausea And Vomiting  . Other Other (See Comments)    Barley & Carrots: learned through allergy testing. Unknown reaction  . Sulfa Antibiotics Hives  . Nickel Rash    Past Medical History  Diagnosis Date  . Hypercholesteremia   . Hypertension   . Diabetes mellitus   . Stroke [facility])     reports TIA 3-4 weeks ago  . Anemia   . CAD (coronary artery disease)   . Peripheral vascular disease ([place])   . Cancer [facility])     Breast cancer    Past Surgical History  Procedure Laterality Date  . Abdominal hysterectomy    . Mastectomy    . Humerus fracture surgery    . Appendectomy  1951  . Nasal septum surgery  1977    Dr. Ernesto Rutherford  . [place] luc sinusotomy  [id number]  . Cholecystectomy  2003    By Dr. Excell Seltzer  . Fracture surgery Right     ORIF   by Dr. Burney Gauze  . Eye surgery Bilateral 2009    Cataract surgery  . Toenail excision      Social History:  reports that she quit smoking about 37 years ago. She does not have any smokeless tobacco history on file. She reports that she does not drink alcohol or use illicit drugs.  Family History:  Family History  Problem Relation Age of Onset  . Cancer Mother   .  Heart disease Father   . Heart attack Father   . Diabetes Brother   . Heart disease Brother   . Hyperlipidemia Brother      Prior to Admission medications   Medication Sig Start Date End Date Taking? Authorizing Provider  atenolol (TENORMIN) 50 MG tablet Take 100 mg by mouth daily.    Yes Historical Provider, MD  ramipril (ALTACE) 10 MG capsule Take 20 mg by mouth daily.    Yes Historical Provider, MD  ALPRAZolam Duanne Moron) 0.5 MG tablet Take 0.5 mg by mouth 4 (four)  times daily as needed for anxiety.     Historical Provider, MD  atorvastatin (LIPITOR) 80 MG tablet Take 80 mg by mouth at bedtime.     Historical Provider, MD  BIOTIN PO Take 2 tablets by mouth daily.     Historical Provider, MD  CALCIUM PO Take 1 tablet by mouth daily.     Historical Provider, MD  Cholecalciferol (VITAMIN D) 2000 UNITS CAPS Take 1 capsule by mouth daily.    Historical Provider, MD  clopidogrel (PLAVIX) 75 MG tablet Take 75 mg by mouth daily.    Historical Provider, MD  diphenhydrAMINE (BENADRYL) 25 MG tablet Take 25-50 mg by mouth at bedtime as needed for allergies.    Historical Provider, MD  divalproex (DEPAKOTE) 125 MG DR tablet Take 1 tablet (125 mg total) by mouth 3 (three) times daily. 10/23/15   Helen Furry, MD  divalproex (DEPAKOTE) 125 MG DR tablet Take 1 tablet (125 mg total) by mouth 3 (three) times daily. 10/23/15   Helen Furry, MD  mometasone (NASONEX) 50 MCG/ACT nasal spray Place 2 sprays into the nose daily.    Historical Provider, MD  Multiple Vitamin (MULTIVITAMIN WITH MINERALS) TABS tablet Take 1 tablet by mouth every other day.     Historical Provider, MD  omeprazole (PRILOSEC) 20 MG capsule Take 20 mg by mouth daily.    Historical Provider, MD  ondansetron (ZOFRAN) 4 MG tablet Take 4 mg by mouth every 8 (eight) hours as needed for refractory nausea / vomiting.  01/25/15   Historical Provider, MD  vitamin C (ASCORBIC ACID) 500 MG tablet Take 1,000 mg by mouth daily.     Historical Provider, MD    Physical Exam: Filed Vitals:   12/13/15 1807 12/13/15 1830 12/13/15 1936 12/13/15 1945  BP: 193/68 174/76 195/85 174/60  Pulse: 70 75 79 78  Temp: 98.1 F (36.7 C)     TempSrc: Oral     Resp: 18 21 17 17   SpO2: 97% 98% 94% 93%   General: Not in acute distress HEENT:       Eyes: PERRL, EOMI, no scleral icterus.       ENT: No discharge from the ears and nose, no pharynx injection, no tonsillar enlargement.        Neck: No JVD, no bruit, no mass felt. Heme: No neck  lymph node enlargement. Cardiac: S1/S2, RRR, No murmurs, No gallops or rubs. Pulm: No rales, wheezing, rhonchi or rubs. Abd: Soft, nondistended, nontender, no rebound pain, no organomegaly, BS present. GU: No hematuria Ext: No pitting leg edema bilaterally. 2+DP/PT pulse bilaterally. Musculoskeletal: No joint deformities, No joint redness or warmth, no limitation of ROM in spin. Skin: No rashes.  Neuro: Alert, not oriented to place and time. She knows her daguhters, cranial nerves II-XII grossly intact, moves all extremities normally. Muscle strength 5/5 in all extremities, sensation to light touch intact.Knee reflex 1+ bilaterally. Negative Babinski's sign. Psych: Patient is  not psychotic, no suicidal or hemocidal ideation.  Labs on Admission: I have personally reviewed following labs and imaging studies  CBC:  Recent Labs Lab 12/13/15 1820  WBC 17.7*  HGB 14.6  HCT 40.5  MCV 80.4  PLT 123456   Basic Metabolic Panel:  Recent Labs Lab 12/13/15 1820  NA 115*  K 3.2*  CL 72*  CO2 28  GLUCOSE 129*  BUN 23*  CREATININE 0.82  CALCIUM 9.1   GFR: CrCl cannot be calculated (Unknown ideal weight.). Liver Function Tests: No results for input(s): AST, ALT, ALKPHOS, BILITOT, PROT, ALBUMIN in the last 168 hours. No results for input(s): LIPASE, AMYLASE in the last 168 hours. No results for input(s): AMMONIA in the last 168 hours. Coagulation Profile:  Recent Labs Lab 12/13/15 1800  INR 1.05   Cardiac Enzymes: No results for input(s): CKTOTAL, CKMB, CKMBINDEX, TROPONINI in the last 168 hours. BNP (last 3 results) No results for input(s): PROBNP in the last 8760 hours. HbA1C: No results for input(s): HGBA1C in the last 72 hours. CBG: No results for input(s): GLUCAP in the last 168 hours. Lipid Profile: No results for input(s): CHOL, HDL, LDLCALC, TRIG, CHOLHDL, LDLDIRECT in the last 72 hours. Thyroid Function Tests: No results for input(s): TSH, T4TOTAL, FREET4, T3FREE,  THYROIDAB in the last 72 hours. Anemia Panel: No results for input(s): VITAMINB12, FOLATE, FERRITIN, TIBC, IRON, RETICCTPCT in the last 72 hours. Urine analysis:    Component Value Date/Time   COLORURINE YELLOW 10/22/2015 [place] 10/22/2015 1235   LABSPEC 1.010 10/22/2015 1235   PHURINE 7.0 10/22/2015 1235   GLUCOSEU NEGATIVE 10/22/2015 1235   HGBUR NEGATIVE 10/22/2015 1235   BILIRUBINUR NEGATIVE 10/22/2015 1235   KETONESUR NEGATIVE 10/22/2015 1235   PROTEINUR 30* 10/22/2015 1235   NITRITE NEGATIVE 10/22/2015 1235   LEUKOCYTESUR NEGATIVE 10/22/2015 1235   Sepsis Labs: @LABRCNTIP (procalcitonin:4,lacticidven:4) )No results found for this or any previous visit (from the past 240 hour(s)).   Radiological Exams on Admission: Ct Head Wo Contrast  12/13/2015  CLINICAL DATA:  Expressive aphasia. Hx HTN, stroke, and diabetes. EXAM: CT HEAD WITHOUT CONTRAST TECHNIQUE: Contiguous axial images were obtained from the base of the skull through the vertex without intravenous contrast. COMPARISON:  03/08/2015 FINDINGS: There is significant atrophy and small vessel disease. Chronic lacunar infarcts are identified within the left basal ganglia and left periventricular white matter. There is no intra or extra-axial fluid collection or mass lesion. The basilar cisterns and ventricles have a normal appearance. There is no CT evidence for acute infarction or hemorrhage. Bone windows demonstrate previous sinus surgery. There is atherosclerotic calcification of the internal carotid arteries. IMPRESSION: 1. Atrophy and small vessel disease. 2. Chronic ischemic changes involving the left periventricular white matter and basal ganglia. 3.  No evidence for acute intracranial abnormality. Electronically Signed   By: Nolon Nations M.D.   On: 12/13/2015 19:05     EKG: Independently reviewed. QTC 434, ST depression in inferior disease in the V4-V6, which existed in previous EKG on 10/22/15,  occasional PAC  Assessment/Plan Principal Problem:   Acute encephalopathy Active Problems:   Carotid stenosis   Hypercholesteremia   Hypertension   Diabetes mellitus without complication (HCC)   Stroke (HCC)   CAD (coronary artery disease)   Peripheral vascular disease (HCC)   Hyponatremia   Hypokalemia   Leukocytosis   Acute encephalopathy: Etiology is not clear, potential differential diagnosis include hyponatremia, known carotid artery stenosis and TIA/stroke. -will admit to tele bed as  inpt -Frequent neuro check -Correct Hyponatremia as below -MRI-brain to r/o stroke -repeat carotid a. Doppler. -Orthostatic vital signs   Hyponatremia: Likely due to decreased oral intake. Zoloft, ramipril and Depakote use may have also contributed. Though pt has AMS, hyponatremia may not be the only etiology, therefore I will not put central line for hypertonic sodium injection, which is invasive.  -Hold Zoloft, ramipril and Depakote -check urine sodium, and urine and plasma osm, TSH, cortisol level -IVF: 1L NS, then 75 cc/h -BMp q6h  Bilateral carotid artery stenosis: Carotid artery Doppler on 02/14/15 showed left internal carotid artery approaching the lower end of the 50-69% and interval progression of right-sided atherosclerotic plaque within the right common carotid artery, likely resulting in a hemodynamically significant stenosis within the proximal CCA. suspected progression of atherosclerotic plaque within the right carotid bulb and proximal aspect of the right internal carotid artery though not definitely resulting in a hemodynamically significant stenosis. Pt was seen by vascular surgeon, Dr. Bridgett Larsson on 03/13/15, who recommended maximize the medical treatment.  -continue plavix and lipitor -repeat carotid artery doppler   Diet controlled DM-II: Last A1c not on record. Patient is not taking med at home. CBG=129 on admission -monitoring CBG-->qAM CBG -Check A1c  HLD: Last LDL was not  on record -Continue home medications: lipitor -Check FLP  HTN: -Hold ramipril due to low Na level -continue atenolol -IV hydralazine  Hx of stroke: -On plavix and lipitor  CAD: no CP. Trop negative  -continue plavix, atenolol and lipiotor  Hypokalemia: K= 3.2 on admission. - Repleted - Check Mg level  Addendum: repeat BMP showed K=3.0 and Mg=1.6 -give KCl 40 mEq x 1 and 2 g of magnesium sulfate  Leukocytosis: WBC 17.7. Etiology is not clear. Patient does not have signs of infection. She denies symptoms of UTI. She does not have fever. No source of infection identified. Patient received 1 dose of Rocephin intramuscular injection in PCP's office. -Wil hold off antibiotics now, but will start antibiotics if patient develops fever -Follow-up blood culture, urine culture and UA  Depression:  -will hold home medications, zoloft which may have contributed to hyponatremia  Migraine headaches: No headache now. -Hold Depakote due to AMS and hyponatremia.   DVT ppx: SQ Lovenox Code Status: Full code Family Communication:  Yes, patient's two daughters at bed side Disposition Plan:  Anticipate discharge back to previous home environment Consults called:  none Admission status: inpatient / tele   Date of Service 12/13/2015    Ivor Costa [facility] Pager [phone]  If 7PM-7AM, please contact night-coverage [email or web address] Password [facility] 12/13/2015, 8:31 PM

## 2015-12-13 NOTE — ED Notes (Signed)
Report attempted 

## 2015-12-13 NOTE — ED Notes (Signed)
Per ems- Pt reports she was walking into her house with the assistance of a family member when she says she lost her balance, but didn't fall. Was dx with UTI today at PCP and got a shot of rocephin. Pt denies feeling dizzy prior to episode. Is alert, but is disoriented to month, and pt's thought content appears scattered at times. BP 172/111, HR 70, 97% RA, 16 RR. CBG 165.

## 2015-12-13 NOTE — ED Provider Notes (Signed)
CSN: [id number]     Arrival date & time 12/13/15  1749 History   First MD Initiated Contact with Patient 12/13/15 1818     Chief Complaint  Patient presents with  . Near Syncope     (Consider location/radiation/quality/duration/timing/severity/associated sxs/prior Treatment) HPI Patient presents with acute onset of lapse and expressive aphasia starting at 1640 today. Patient was walking with assistance when she collapsed. Daughter states that she was awake and trying to speak but that it was unintelligible. Symptoms lasted roughly 5 minutes. There was no trauma. Patient did not hit her head or neck. Patient's had increasing confusion for the past month and especially worsening in the last few days. She normally lives alone and cares for herself. She's been seen by her primary physician and had labs drawn. Significant for hyponatremia and leukocytosis. Given dose of IV Rocephin though UA without definite evidence of UTI. Patient is confused but denies any pain currently. [place] lesion denies any chest pain, shortness of breath, abdominal pain. No headache.  Past Medical History  Diagnosis Date  . Hypercholesteremia   . Hypertension   . Diabetes mellitus   . Stroke [facility])     reports TIA 3-4 weeks ago  . Anemia   . CAD (coronary artery disease)   . Peripheral vascular disease ([place])   . Cancer [facility])     Breast cancer   Past Surgical History  Procedure Laterality Date  . Abdominal hysterectomy    . Mastectomy    . Humerus fracture surgery    . Appendectomy  1951  . Nasal septum surgery  1977    Dr. Ernesto Rutherford  . [place] luc sinusotomy  [id number]  . Cholecystectomy  2003    By Dr. Excell Seltzer  . Fracture surgery Right     ORIF   by Dr. Burney Gauze  . Eye surgery Bilateral 2009    Cataract surgery  . Toenail excision     Family History  Problem Relation Age of Onset  . Cancer Mother   . Heart disease Father   . Heart attack Father   . Diabetes Brother   . Heart disease Brother   .  Hyperlipidemia Brother    Social History  Substance Use Topics  . Smoking status: Former Smoker    Quit date: 08/24/1978  . Smokeless tobacco: None  . Alcohol Use: No   OB History    No data available     Review of Systems  Constitutional: Negative for fever and chills.  Respiratory: Negative for shortness of breath.   Cardiovascular: Negative for chest pain.  Gastrointestinal: Negative for nausea, vomiting, abdominal pain, diarrhea and constipation.  Musculoskeletal: Negative for back pain, neck pain and neck stiffness.  Skin: Negative for rash and wound.  Neurological: Positive for speech difficulty and weakness. Negative for syncope, numbness and headaches.  Psychiatric/Behavioral: Positive for confusion.  All other systems reviewed and are negative.     Allergies  Morphine and related; Other; Sulfa antibiotics; and Nickel  Home Medications   Prior to Admission medications   Medication Sig Start Date End Date Taking? Authorizing Provider  atenolol (TENORMIN) 50 MG tablet Take 100 mg by mouth daily.    Yes Historical Provider, MD  ramipril (ALTACE) 10 MG capsule Take 20 mg by mouth daily.    Yes Historical Provider, MD  ALPRAZolam Duanne Moron) 0.5 MG tablet Take 0.5 mg by mouth 4 (four) times daily as needed for anxiety.     Historical Provider, MD  atorvastatin (LIPITOR) 80 MG  tablet Take 80 mg by mouth at bedtime.     Historical Provider, MD  BIOTIN PO Take 2 tablets by mouth daily.     Historical Provider, MD  CALCIUM PO Take 1 tablet by mouth daily.     Historical Provider, MD  Cholecalciferol (VITAMIN D) 2000 UNITS CAPS Take 1 capsule by mouth daily.    Historical Provider, MD  clopidogrel (PLAVIX) 75 MG tablet Take 75 mg by mouth daily.    Historical Provider, MD  diphenhydrAMINE (BENADRYL) 25 MG tablet Take 25-50 mg by mouth at bedtime as needed for allergies.    Historical Provider, MD  divalproex (DEPAKOTE) 125 MG DR tablet Take 1 tablet (125 mg total) by mouth 3  (three) times daily. 10/23/15   Tanna Furry, MD  divalproex (DEPAKOTE) 125 MG DR tablet Take 1 tablet (125 mg total) by mouth 3 (three) times daily. 10/23/15   Tanna Furry, MD  mometasone (NASONEX) 50 MCG/ACT nasal spray Place 2 sprays into the nose daily.    Historical Provider, MD  Multiple Vitamin (MULTIVITAMIN WITH MINERALS) TABS tablet Take 1 tablet by mouth every other day.     Historical Provider, MD  omeprazole (PRILOSEC) 20 MG capsule Take 20 mg by mouth daily.    Historical Provider, MD  ondansetron (ZOFRAN) 4 MG tablet Take 4 mg by mouth every 8 (eight) hours as needed for refractory nausea / vomiting.  01/25/15   Historical Provider, MD  vitamin C (ASCORBIC ACID) 500 MG tablet Take 1,000 mg by mouth daily.     Historical Provider, MD   BP 174/60 mmHg  Pulse 78  Temp(Src) 98.1 F (36.7 C) (Oral)  Resp 17  SpO2 93% Physical Exam  Constitutional: She appears well-developed and well-nourished. No distress.  HENT:  Head: Normocephalic and atraumatic.  Mouth/Throat: Oropharynx is clear and moist. No oropharyngeal exudate.  No intraoral trauma.  Eyes: EOM are normal. Pupils are equal, round, and reactive to light.  Neck: Normal range of motion. Neck supple.  No posterior midline cervical tenderness to palpation. No meningismus.  Cardiovascular: Normal rate and regular rhythm.  Exam reveals no gallop and no friction rub.   No murmur heard. Pulmonary/Chest: Effort normal and breath sounds normal. No respiratory distress. She has no wheezes. She has no rales. She exhibits no tenderness.  Abdominal: Soft. Bowel sounds are normal. She exhibits no distension and no mass. There is no tenderness. There is no rebound and no guarding.  Musculoskeletal: Normal range of motion. She exhibits no edema or tenderness.  No lower extremity swelling or asymmetry. No calf tenderness. Distal pulses are equal and intact.  Neurological: She is alert.  Oriented to self and place. Patient is confused with  tangential thought pattern. 5/5 motor in all extremities. Sensation is fully intact. Cranial nerves II through XII grossly intact.  Skin: Skin is warm and dry. No rash noted. No erythema.  Psychiatric: She has a normal mood and affect. Her behavior is normal.  Nursing note and vitals reviewed.   ED Course  Procedures (including critical care time) Labs Review Labs Reviewed  BASIC METABOLIC PANEL - Abnormal; Notable for the following:    Sodium 115 (*)    Potassium 3.2 (*)    Chloride 72 (*)    Glucose, Bld 129 (*)    BUN 23 (*)    All other components within normal limits  CBC - Abnormal; Notable for the following:    WBC 17.7 (*)    All other components within  normal limits  PROTIME-INR  APTT  ETHANOL  URINALYSIS, ROUTINE W REFLEX MICROSCOPIC (NOT AT [facility])  URINE RAPID DRUG SCREEN, HOSP PERFORMED  VALPROIC ACID LEVEL  I-STAT TROPOININ, ED  CBG MONITORING, ED    Imaging Review Ct Head Wo Contrast  12/13/2015  CLINICAL DATA:  Expressive aphasia. Hx HTN, stroke, and diabetes. EXAM: CT HEAD WITHOUT CONTRAST TECHNIQUE: Contiguous axial images were obtained from the base of the skull through the vertex without intravenous contrast. COMPARISON:  03/08/2015 FINDINGS: There is significant atrophy and small vessel disease. Chronic lacunar infarcts are identified within the left basal ganglia and left periventricular white matter. There is no intra or extra-axial fluid collection or mass lesion. The basilar cisterns and ventricles have a normal appearance. There is no CT evidence for acute infarction or hemorrhage. Bone windows demonstrate previous sinus surgery. There is atherosclerotic calcification of the internal carotid arteries. IMPRESSION: 1. Atrophy and small vessel disease. 2. Chronic ischemic changes involving the left periventricular white matter and basal ganglia. 3.  No evidence for acute intracranial abnormality. Electronically Signed   By: Nolon Nations M.D.   On: 12/13/2015  19:05   I have personally reviewed and evaluated these images and lab results as part of my medical decision-making.   EKG Interpretation   Date/Time:  Friday December 13 2015 18:46:24 EDT Ventricular Rate:  65 PR Interval:  176 QRS Duration: 86 QT Interval:  417 QTC Calculation: 434 R Axis:   60 Text Interpretation:  Sinus rhythm Atrial premature complexes Repol abnrm,  severe global ischemia (LM/MVD) Confirmed by Lita Mains  MD, Jt Brabec ([id number])  on 12/13/2015 7:56:27 PM      MDM   Final diagnoses:  Expressive aphasia  Hyponatremia  Hypokalemia  Hypochloremia   History remains confused but has a normal neurologic exam. CT with chronic changes. Patient has significant hyponatremia, hypokalemia and hypochloremia. We'll begin gentle IV fluids. Review of patient's records demonstrates similar symptoms associated with aphasia in the past. She also has had a carotid Doppler showing moderate to severe stenosis. Questionable cause for the patient's collapse and neurologic symptoms. Discussed with Dr.Nui. Will see in emergency department and admit to telemetry bed. Family updated     Julianne Rice, MD 12/13/15 1958

## 2015-12-14 ENCOUNTER — Inpatient Hospital Stay (HOSPITAL_COMMUNITY): Payer: Medicare Other

## 2015-12-14 DIAGNOSIS — I10 Essential (primary) hypertension: Secondary | ICD-10-CM

## 2015-12-14 DIAGNOSIS — E119 Type 2 diabetes mellitus without complications: Secondary | ICD-10-CM

## 2015-12-14 DIAGNOSIS — G9340 Encephalopathy, unspecified: Secondary | ICD-10-CM

## 2015-12-14 DIAGNOSIS — I2510 Atherosclerotic heart disease of native coronary artery without angina pectoris: Secondary | ICD-10-CM

## 2015-12-14 LAB — CBC
HCT: 39.6 % (ref 36.0–46.0)
Hemoglobin: 14.4 g/dL (ref 12.0–15.0)
MCH: 29.4 pg (ref 26.0–34.0)
MCHC: 36.4 g/dL — ABNORMAL HIGH (ref 30.0–36.0)
MCV: 80.8 fL (ref 78.0–100.0)
Platelets: 236 10*3/uL (ref 150–400)
RBC: 4.9 MIL/uL (ref 3.87–5.11)
RDW: 14.3 % (ref 11.5–15.5)
WBC: 17.6 10*3/uL — ABNORMAL HIGH (ref 4.0–10.5)

## 2015-12-14 LAB — BASIC METABOLIC PANEL
Anion gap: 12 (ref 5–15)
Anion gap: 12 (ref 5–15)
BUN: 15 mg/dL (ref 6–20)
BUN: 12 mg/dL (ref 6–20)
CO2: 28 mmol/L (ref 22–32)
CO2: 23 mmol/L (ref 22–32)
Calcium: 8.6 mg/dL — ABNORMAL LOW (ref 8.9–10.3)
Calcium: 8.1 mg/dL — ABNORMAL LOW (ref 8.9–10.3)
Chloride: 78 mmol/L — ABNORMAL LOW (ref 101–111)
Chloride: 85 mmol/L — ABNORMAL LOW (ref 101–111)
Creatinine, Ser: 0.78 mg/dL (ref 0.44–1.00)
Creatinine, Ser: 0.56 mg/dL (ref 0.44–1.00)
GFR calc Af Amer: >60 mL/min (ref >60)
GFR calc Af Amer: >60 mL/min (ref >60)
GFR calc non Af Amer: >60 mL/min (ref >60)
GFR calc non Af Amer: >60 mL/min (ref >60)
Glucose, Bld: 111 mg/dL — ABNORMAL HIGH (ref 65–99)
Glucose, Bld: 111 mg/dL — ABNORMAL HIGH (ref 65–99)
Potassium: 3 mmol/L — ABNORMAL LOW (ref 3.5–5.1)
Potassium: 3.2 mmol/L — ABNORMAL LOW (ref 3.5–5.1)
Sodium: 118 mmol/L — CL (ref 135–145)
Sodium: 120 mmol/L — ABNORMAL LOW (ref 135–145)

## 2015-12-14 LAB — LIPID PANEL
Cholesterol: 118 mg/dL (ref 0–200)
HDL: 33 mg/dL — ABNORMAL LOW (ref >40)
LDL Cholesterol: 65 mg/dL (ref 0–99)
Total CHOL/HDL Ratio: 3.6 RATIO
Triglycerides: 100 mg/dL (ref <150)
VLDL: 20 mg/dL (ref 0–40)

## 2015-12-14 LAB — GLUCOSE, CAPILLARY: Glucose-Capillary: 102 mg/dL — ABNORMAL HIGH (ref 65–99)

## 2015-12-14 LAB — COMPREHENSIVE METABOLIC PANEL
ALT: 26 U/L (ref 14–54)
ALT: 21 U/L (ref 14–54)
AST: 36 U/L (ref 15–41)
AST: 23 U/L (ref 15–41)
Albumin: 3.1 g/dL — ABNORMAL LOW (ref 3.5–5.0)
Albumin: 2.7 g/dL — ABNORMAL LOW (ref 3.5–5.0)
Alkaline Phosphatase: 49 U/L (ref 38–126)
Alkaline Phosphatase: 41 U/L (ref 38–126)
Anion gap: 13 (ref 5–15)
Anion gap: 7 (ref 5–15)
BUN: 14 mg/dL (ref 6–20)
BUN: 17 mg/dL (ref 6–20)
CO2: 23 mmol/L (ref 22–32)
CO2: 27 mmol/L (ref 22–32)
Calcium: 8.2 mg/dL — ABNORMAL LOW (ref 8.9–10.3)
Calcium: 7.9 mg/dL — ABNORMAL LOW (ref 8.9–10.3)
Chloride: 84 mmol/L — ABNORMAL LOW (ref 101–111)
Chloride: 89 mmol/L — ABNORMAL LOW (ref 101–111)
Creatinine, Ser: 0.61 mg/dL (ref 0.44–1.00)
Creatinine, Ser: 0.82 mg/dL (ref 0.44–1.00)
GFR calc Af Amer: >60 mL/min (ref >60)
GFR calc Af Amer: >60 mL/min (ref >60)
GFR calc non Af Amer: >60 mL/min (ref >60)
GFR calc non Af Amer: >60 mL/min (ref >60)
Glucose, Bld: 138 mg/dL — ABNORMAL HIGH (ref 65–99)
Glucose, Bld: 109 mg/dL — ABNORMAL HIGH (ref 65–99)
Potassium: 3.3 mmol/L — ABNORMAL LOW (ref 3.5–5.1)
Potassium: 3.7 mmol/L (ref 3.5–5.1)
Sodium: 120 mmol/L — ABNORMAL LOW (ref 135–145)
Sodium: 123 mmol/L — ABNORMAL LOW (ref 135–145)
Total Bilirubin: 1.2 mg/dL (ref 0.3–1.2)
Total Bilirubin: 1 mg/dL (ref 0.3–1.2)
Total Protein: 6 g/dL — ABNORMAL LOW (ref 6.5–8.1)
Total Protein: 5.3 g/dL — ABNORMAL LOW (ref 6.5–8.1)

## 2015-12-14 LAB — AMMONIA: Ammonia: 73 umol/L — ABNORMAL HIGH (ref 9–35)

## 2015-12-14 LAB — MAGNESIUM: Magnesium: 1.6 mg/dL — ABNORMAL LOW (ref 1.7–2.4)

## 2015-12-14 LAB — TSH: TSH: 2.058 u[IU]/mL (ref 0.350–4.500)

## 2015-12-14 LAB — CORTISOL-AM, BLOOD: Cortisol - AM: 32.3 ug/dL — ABNORMAL HIGH (ref 6.7–22.6)

## 2015-12-14 MED ORDER — MAGNESIUM SULFATE 2 GM/50ML IV SOLN
2.0000 g | Freq: Once | INTRAVENOUS | Status: AC
  Administered 2015-12-14: 2 g via INTRAVENOUS
  Filled 2015-12-14: qty 50

## 2015-12-14 MED ORDER — POTASSIUM CHLORIDE 20 MEQ/15ML (10%) PO SOLN
40.0000 meq | Freq: Once | ORAL | Status: AC
  Administered 2015-12-14: 40 meq via ORAL
  Filled 2015-12-14: qty 30

## 2015-12-14 MED ORDER — ENSURE ENLIVE PO LIQD
237.0000 mL | Freq: Two times a day (BID) | ORAL | Status: DC
  Administered 2015-12-15 – 2015-12-18 (×7): 237 mL via ORAL
  Filled 2015-12-14 (×11): qty 237

## 2015-12-14 MED ORDER — POTASSIUM CHLORIDE IN NACL 40-0.9 MEQ/L-% IV SOLN
INTRAVENOUS | Status: DC
  Administered 2015-12-14 – 2015-12-15 (×2): 75 mL/h via INTRAVENOUS
  Filled 2015-12-14 (×4): qty 1000

## 2015-12-14 NOTE — Evaluation (Signed)
Clinical/Bedside Swallow Evaluation Patient Details  Name: Helen [name] MRN: [id number] Date of Birth: [birth year]  Today's Date: 12/14/2015 Time: SLP Start Time (ACUTE ONLY): 1150 SLP Stop Time (ACUTE ONLY): 1220 SLP Time Calculation (min) (ACUTE ONLY): 30 min  Past Medical History:  Past Medical History  Diagnosis Date  . Hypercholesteremia   . Hypertension   . Diabetes mellitus   . Stroke [facility])     reports TIA 3-4 weeks ago  . Anemia   . CAD (coronary artery disease)   . Peripheral vascular disease ([place])   . Cancer [facility])     Breast cancer   Past Surgical History:  Past Surgical History  Procedure Laterality Date  . Abdominal hysterectomy    . Mastectomy    . Humerus fracture surgery    . Appendectomy  1951  . Nasal septum surgery  1977    Dr. Ernesto Rutherford  . [place] luc sinusotomy  [id number]  . Cholecystectomy  2003    By Dr. Excell Seltzer  . Fracture surgery Right     ORIF   by Dr. Burney Gauze  . Eye surgery Bilateral 2009    Cataract surgery  . Toenail excision     HPI:  Helen [name] is a 82 y.o. female with medical history significant of hypertension, hyperlipidemia, diet-controlled diabetes, GERD, stroke, CAD, PVD, right upper stenosis, breast cancer (s/p of mastectomy), migraine headache, who presents with AMS.  Per patient's daughter, patient becomes confused and has poor balance. She may also have transient expressive aphasia at about 4:00 to 5:00 PM. Per her daughters, patient was walking with assistance when she almost collapsed. Daughter states that she was awake and trying to speak, but that it was unintelligible. Symptoms lasted roughly 5 minutes. There was no trauma. Patient did not hit her head or neck.She does not have unilateral weakness, tingling sensations, vision change or hearing loss. She did not have seizure activities. Patient was seen by her PCP today, and was given one gram of Rocephin intramuscularly injection due to leukocytosis. Patient had negative chest  x-ray and negative urinalysis for UTI in PCPs office. She was given prescription of Levaquin, but patient has not started taking this medication yet. Patient does not have respiratory symptoms, no cough, shortness of breath, chest pain, fever or chills.   Current MRI showing no acute intracranial infarct or other process identified,  age-related cerebral atrophy with mild chronic small vessel ischemic disease, and remote lacunar infarct within the left basal ganglia.   Assessment / Plan / Recommendation Clinical Impression  Clinical swallowing evaluation was completed.  The patient presented with mild oral dysphagia characterized by delayed oral transit for dry solids.  Swallow trigger was timely and hyo-laryngeal excursion was judged to be adequate.  Overt s/s of aspiration were not seen.  The patient does complain of symptoms that are most consistent with esophageal dysphagia given pills and dry solids.  She reported that she tends to drink a lot of fluid with her meals.  MD stated that she was going to order a regular barium swallow.  Recommend begin a regular diet and thin liquids.  May need to consider downgrade to dysphagia 3 if chewing time is making meal times very long.  ST will follow up for therapeutic diet tolerance.     Aspiration Risk  Mild aspiration risk    Diet Recommendation   Regular diet with thin liquids (consider dysphagia 3 if meal times are very long)  Medication Administration: Whole meds with puree  Other  Recommendations Oral Care Recommendations: Oral care BID   Follow up Recommendations   (TBD)    Frequency and Duration min 2x/week  2 weeks       Prognosis Prognosis for Safe Diet Advancement: Good      Swallow Study   General Date of Onset: 12/13/15 HPI: Helen [name] is a 82 y.o. female with medical history significant of hypertension, hyperlipidemia, diet-controlled diabetes, GERD, stroke, CAD, PVD, right upper stenosis, breast cancer (s/p of  mastectomy), migraine headache, who presents with AMS.  Per patient's daughter, patient becomes confused and has poor balance. She may also have transient expressive aphasia at about 4:00 to 5:00 PM. Per her daughters, patient was walking with assistance when she almost collapsed. Daughter states that she was awake and trying to speak, but that it was unintelligible. Symptoms lasted roughly 5 minutes. There was no trauma. Patient did not hit her head or neck.She does not have unilateral weakness, tingling sensations, vision change or hearing loss. She did not have seizure activities. Patient was seen by her PCP today, and was given one gram of Rocephin intramuscularly injection due to leukocytosis. Patient had negative chest x-ray and negative urinalysis for UTI in PCPs office. She was given prescription of Levaquin, but patient has not started taking this medication yet. Patient does not have respiratory symptoms, no cough, shortness of breath, chest pain, fever or chills.   Current MRI showing no acute intracranial infarct or other process identified,  age-related cerebral atrophy with mild chronic small vessel ischemic disease, and remote lacunar infarct within the left basal ganglia. Type of Study: Bedside Swallow Evaluation Previous Swallow Assessment: None noted.   Diet Prior to this Study: NPO Temperature Spikes Noted: No Respiratory Status: Room air History of Recent Intubation: No Behavior/Cognition: Alert;Cooperative;Pleasant mood;Requires cueing Oral Cavity Assessment: Within Functional Limits Oral Care Completed by SLP: No Oral Cavity - Dentition: Adequate natural dentition Vision: Functional for self-feeding Self-Feeding Abilities: Able to feed self Patient Positioning: Upright in bed Baseline Vocal Quality: Normal Volitional Cough: Strong Volitional Swallow: Able to elicit    Oral/Motor/Sensory Function Overall Oral Motor/Sensory Function: Within functional limits   Ice Chips Ice  chips: Not tested   Thin Liquid Thin Liquid: Within functional limits Presentation: Cup;Spoon;Self Fed    Nectar Thick Nectar Thick Liquid: Not tested   Honey Thick Honey Thick Liquid: Not tested   Puree Puree: Within functional limits Presentation: Spoon   Solid   GO   Solid: Impaired Presentation: Self Fed Oral Phase Impairments: Impaired mastication Oral Phase Functional Implications: Prolonged oral transit    Functional Assessment Tool Used: ASHA NOMS and clinical judgment.  Functional Limitations: Swallowing Swallow Current Status [phone]): At least 1 percent but less than 20 percent impaired, limited or restricted Swallow Goal Status [phone]): At least 1 percent but less than 20 percent impaired, limited or restricted  Shelly Flatten, MA, [facility] SLP [phone] Shelly Flatten N 12/14/2015,12:28 PM

## 2015-12-14 NOTE — Progress Notes (Signed)
[facility] PROGRESS NOTE  Helen [name] [id number] DOB: 1930/06/14 DOA: 12/13/2015   PCP: Helen Solo, MD     Assessment/Plan: Principal Problem:   Acute encephalopathy Active Problems:   Carotid stenosis   Hypercholesteremia   Hypertension   Diabetes mellitus without complication ([place])   Stroke ([place])   CAD (coronary artery disease)   Peripheral vascular disease ([place])   Hyponatremia   Hypokalemia   Leukocytosis   Essential hypertension   Helen [name] is a 82 y.o. female with medical history significant of hypertension, hyperlipidemia, diet-controlled diabetes, GERD, stroke, CAD, PVD, right upper stenosis, breast cancer (s/p of mastectomy), migraine headache, who presents with AMS.  Per patient's daughter, patient becomes confused and has poor balance. She may also have transient expressive aphasia at about 4:00 to 5:00 PM. Per her daughters, patient was walking with assistance when she almost collapsed. Daughter states that she was awake and trying to speak, but that it was unintelligible. Symptoms lasted roughly 5 minutes. . Patient was seen by her PCP, and was given one gram of Rocephin intramuscularly injection due to leukocytosis. Patient had negative chest x-ray and negative urinalysis for UTI in PCPs office. She was given prescription of Levaquin, but patient has not started taking this medication yet. ED Course: pt was found to have leukocytosis with WBC 17.7, INR 1.05, PTT 28, negative troponin, temperature normal, no tachycardia, potassium 3.2, sodium 115, creatinine normal, negative CT head for acute intracranial abnormalities. Patient's admitted to inpatient for further eval and treatment.  Assessment and plan Acute encephalopathy: Etiology is not clear, potential differential diagnosis include hyponatremia, known carotid artery stenosis and TIA/stroke. -cont  tele -sinus rhythm with PACs -Frequent neuro check -Correct Hyponatremia as  below MRI brain no acute stroke remote lacunar infarct Carotid Doppler.    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 Zoloft, ramipril and Depakote use may have also contributed.   hyponatremia may  be the   Etiology,acute decline since 2/28  -Hold Zoloft, ramipril and Depakote   urine sodium, and urine and plasma osm nl , TSH 2.0 , cortisol level- 32.3 -BMp q6h  Bilateral carotid artery stenosis: Carotid artery Doppler on 02/14/15 showed left internal carotid artery approaching the lower end of the 50-69% and interval progression of right-sided atherosclerotic plaque within the right common carotid artery, likely resulting in a hemodynamically significant stenosis within the proximal CCA. suspected progression of atherosclerotic plaque within the right carotid bulb and proximal aspect of the right internal carotid artery though not definitely resulting in a hemodynamically significant stenosis. Pt was seen by vascular surgeon, Dr. Bridgett Larsson on 03/13/15, who recommended maximize the medical treatment. -continue plavix and lipitor -repeat carotid artery doppler    Diet controlled DM-II: Last A1c not on record. Patient is not taking med at home. CBG=129 on admission -monitoring CBG-->qAM CBG -Check A1c  HLD: Last LDL was not on record -Continue home medications: lipitor LDL 65, triglycerides 100  HTN: -Hold ramipril due to low Na level -continue atenolol -IV hydralazine  Hx of stroke: -On plavix and lipitor  CAD: no CP. Trop negative  -continue plavix, atenolol and lipiotor  Hypokalemia: K= 3.2 on admission. - Repleted - Check Mg level 1.6    Leukocytosis: WBC 17.7. Etiology is not clear.repeat CXR,  She denies symptoms of UTI. She does not have fever. No source of infection identified. Patient received 1 dose of Rocephin intramuscular injection in PCP's office. -Wil hold off  antibiotics now, but will start antibiotics if patient develops  fever -Follow-up blood culture, urine culture and UA  Depression:  -will hold home medications, zoloft which may have contributed to hyponatremia, SIADH  Migraine headaches: No headache now. -Hold Depakote due to AMS and hyponatremia. Depakote level negative, not short of the patient has been taking Depakote    DVT prophylaxsis   Code Status:  Full code     Family Communication: Discussed in detail with the patient/daughters, all imaging results, lab results explained to the patient   Disposition Plan:  Anticipate discharge in 2-3 days      Consultants: None   Procedures:  None   Antibiotics: Anti-infectives    None         HPI/Subjective: Patient continues to be confused, patient's daughter that the room, asking lots of questions, patient apparently did not sleep well last night  Objective: Filed Vitals:   12/13/15 2320 12/14/15 0126 12/14/15 0356 12/14/15 0630  BP: 132/47 140/51 173/54 187/68  Pulse: 90 85 82 88  Temp: 98.6 F (37 C) 97.9 F (36.6 C) 98.5 F (36.9 C) 98 F (36.7 C)  TempSrc: Oral Oral Oral Oral  Resp: 18 18 18 18   SpO2: 100% 97% 98% 97%   No intake or output data in the 24 hours ending 12/14/15 0935  Exam:  Examination:  General: Not in acute distress HEENT:  Eyes: PERRL, EOMI, no scleral icterus.  ENT: No discharge from the ears and nose, no pharynx injection, no tonsillar enlargement.   Neck: No JVD, no bruit, no mass felt. Heme: No neck lymph node enlargement. Cardiac: S1/S2, RRR, No murmurs, No gallops or rubs. Pulm: No rales, wheezing, rhonchi or rubs. Abd: Soft, nondistended, nontender, no rebound pain, no organomegaly, BS present. GU: No hematuria Ext: No pitting leg edema bilaterally. 2+DP/PT pulse bilaterally. Musculoskeletal: No joint deformities, No joint redness or warmth, no limitation of ROM in spin. Skin: No rashes.  Neuro: Alert, not oriented to place and time. She knows her daguhters,  cranial nerves II-XII grossly intact, moves all extremities normally. Muscle strength 5/5 in all extremities, sensation to light touch intact.Knee reflex 1+ bilaterally. Negative Babinski's sign. Psych: Patient is not psychotic, no suicidal or hemocidal ideation.   Data Reviewed: I have personally reviewed following labs and imaging studies  Micro Results No results found for this or any previous visit (from the past 240 hour(s)).  Radiology Reports Ct Head Wo Contrast  12/13/2015  CLINICAL DATA:  Expressive aphasia. Hx HTN, stroke, and diabetes. EXAM: CT HEAD WITHOUT CONTRAST TECHNIQUE: Contiguous axial images were obtained from the base of the skull through the vertex without intravenous contrast. COMPARISON:  03/08/2015 FINDINGS: There is significant atrophy and small vessel disease. Chronic lacunar infarcts are identified within the left basal ganglia and left periventricular white matter. There is no intra or extra-axial fluid collection or mass lesion. The basilar cisterns and ventricles have a normal appearance. There is no CT evidence for acute infarction or hemorrhage. Bone windows demonstrate previous sinus surgery. There is atherosclerotic calcification of the internal carotid arteries. IMPRESSION: 1. Atrophy and small vessel disease. 2. Chronic ischemic changes involving the left periventricular white matter and basal ganglia. 3.  No evidence for acute intracranial abnormality. Electronically Signed   By: Nolon Nations M.D.   On: 12/13/2015 19:05   Mr Brain Wo Contrast  12/13/2015  CLINICAL DATA:  Initial evaluation for acute encephalopathy. Possible transient aphasia. EXAM: MRI HEAD WITHOUT CONTRAST TECHNIQUE: Multiplanar, multiecho pulse  sequences of the brain and surrounding structures were obtained without intravenous contrast. COMPARISON:  Prior head CT from earlier same day. FINDINGS: Study mildly degraded by motion artifact. Diffuse prominence of the CSF containing spaces  compatible with generalized age-related cerebral atrophy. Patchy T2/FLAIR hyperintensity within the periventricular and deep white matter both cerebral hemispheres most consistent with chronic small vessel ischemic disease, mild for patient age. Chronic small vessel ischemic type changes present within the central pons as well. Remote lacunar infarct present within the left basal ganglia. No abnormal foci of restricted diffusion to suggest acute intracranial infarct. Gray-white matter differentiation maintained. Major intracranial vascular flow voids are preserved. No acute or chronic intracranial hemorrhage. No mass lesion, midline shift, or mass effect. No hydrocephalus. No extra-axial fluid collection. Major dural sinuses are grossly patent. Craniocervical junction within normal limits. Visualized upper cervical spine demonstrates no acute abnormality. Pituitary gland normal. No acute abnormality about the orbits. Sequela prior bilateral lens extraction noted. Paranasal sinuses are largely clear. No mastoid effusion. Inner ear structures grossly normal. Bone marrow signal intensity within normal limits. Scalp soft tissues demonstrate no acute process. IMPRESSION: 1. No acute intracranial infarct or other process identified. 2. Age-related cerebral atrophy with mild chronic small vessel ischemic disease. 3. Remote lacunar infarct within the left basal ganglia. Electronically Signed   By: Jeannine Boga M.D.   On: 12/13/2015 22:38     CBC  Recent Labs Lab 12/13/15 1820 12/14/15 0019  WBC 17.7* 17.6*  HGB 14.6 14.4  HCT 40.5 39.6  PLT 253 236  MCV 80.4 80.8  MCH 29.0 29.4  MCHC 36.0 36.4*  RDW 14.2 14.3    Chemistries   Recent Labs Lab 12/13/15 1820 12/14/15 0017 12/14/15 0019 12/14/15 0744  NA 115* 118*  --  120*  K 3.2* 3.0*  --  3.2*  CL 72* 78*  --  85*  CO2 28 28  --  23  GLUCOSE 129* 111*  --  111*  BUN 23* 15  --  12  CREATININE 0.82 0.78  --  0.56  CALCIUM 9.1 8.6*  --   8.1*  MG  --   --  1.6*  --    ------------------------------------------------------------------------------------------------------------------ CrCl cannot be calculated (Unknown ideal weight.). ------------------------------------------------------------------------------------------------------------------ No results for input(s): HGBA1C in the last 72 hours. ------------------------------------------------------------------------------------------------------------------  Recent Labs  12/14/15 0017  CHOL 118  HDL 33*  LDLCALC 65  TRIG 100  CHOLHDL 3.6   ------------------------------------------------------------------------------------------------------------------  Recent Labs  12/14/15 0019  TSH 2.058   ------------------------------------------------------------------------------------------------------------------ No results for input(s): VITAMINB12, FOLATE, FERRITIN, TIBC, IRON, RETICCTPCT in the last 72 hours.  Coagulation profile  Recent Labs Lab 12/13/15 1800  INR 1.05    No results for input(s): DDIMER in the last 72 hours.  Cardiac Enzymes No results for input(s): CKMB, TROPONINI, MYOGLOBIN in the last 168 hours.  Invalid input(s): CK ------------------------------------------------------------------------------------------------------------------ Invalid input(s): POCBNP   CBG:  Recent Labs Lab 12/14/15 0609  GLUCAP 102*       Studies: Ct Head Wo Contrast  12/13/2015  CLINICAL DATA:  Expressive aphasia. Hx HTN, stroke, and diabetes. EXAM: CT HEAD WITHOUT CONTRAST TECHNIQUE: Contiguous axial images were obtained from the base of the skull through the vertex without intravenous contrast. COMPARISON:  03/08/2015 FINDINGS: There is significant atrophy and small vessel disease. Chronic lacunar infarcts are identified within the left basal ganglia and left periventricular white matter. There is no intra or extra-axial fluid collection or mass  lesion. The basilar cisterns and ventricles have  a normal appearance. There is no CT evidence for acute infarction or hemorrhage. Bone windows demonstrate previous sinus surgery. There is atherosclerotic calcification of the internal carotid arteries. IMPRESSION: 1. Atrophy and small vessel disease. 2. Chronic ischemic changes involving the left periventricular white matter and basal ganglia. 3.  No evidence for acute intracranial abnormality. Electronically Signed   By: Nolon Nations M.D.   On: 12/13/2015 19:05   Mr Brain Wo Contrast  12/13/2015  CLINICAL DATA:  Initial evaluation for acute encephalopathy. Possible transient aphasia. EXAM: MRI HEAD WITHOUT CONTRAST TECHNIQUE: Multiplanar, multiecho pulse sequences of the brain and surrounding structures were obtained without intravenous contrast. COMPARISON:  Prior head CT from earlier same day. FINDINGS: Study mildly degraded by motion artifact. Diffuse prominence of the CSF containing spaces compatible with generalized age-related cerebral atrophy. Patchy T2/FLAIR hyperintensity within the periventricular and deep white matter both cerebral hemispheres most consistent with chronic small vessel ischemic disease, mild for patient age. Chronic small vessel ischemic type changes present within the central pons as well. Remote lacunar infarct present within the left basal ganglia. No abnormal foci of restricted diffusion to suggest acute intracranial infarct. Gray-white matter differentiation maintained. Major intracranial vascular flow voids are preserved. No acute or chronic intracranial hemorrhage. No mass lesion, midline shift, or mass effect. No hydrocephalus. No extra-axial fluid collection. Major dural sinuses are grossly patent. Craniocervical junction within normal limits. Visualized upper cervical spine demonstrates no acute abnormality. Pituitary gland normal. No acute abnormality about the orbits. Sequela prior bilateral lens extraction noted.  Paranasal sinuses are largely clear. No mastoid effusion. Inner ear structures grossly normal. Bone marrow signal intensity within normal limits. Scalp soft tissues demonstrate no acute process. IMPRESSION: 1. No acute intracranial infarct or other process identified. 2. Age-related cerebral atrophy with mild chronic small vessel ischemic disease. 3. Remote lacunar infarct within the left basal ganglia. Electronically Signed   By: Jeannine Boga M.D.   On: 12/13/2015 22:38      No results found for: HGBA1C Lab Results  Component Value Date   LDLCALC 65 12/14/2015   CREATININE 0.56 12/14/2015       Scheduled Meds: . atenolol  100 mg Oral Daily  . atorvastatin  80 mg Oral QHS  . calcium carbonate  1,000 mg of elemental calcium Oral Q breakfast  . cholecalciferol  2,000 Units Oral Daily  . clopidogrel  75 mg Oral Daily  . enoxaparin (LOVENOX) injection  40 mg Subcutaneous QHS  . magnesium sulfate 1 - 4 g bolus IVPB  2 g Intravenous Once  . multivitamin with minerals  1 tablet Oral Daily  . pantoprazole  40 mg Oral Daily  . sodium chloride flush  3 mL Intravenous Q12H   Continuous Infusions: . 0.9 % NaCl with KCl 40 mEq / L       LOS: 1 day    Time spent: >30 MINS    [facility] Pager [phone]. If 7PM-7AM, please contact night-coverage at [email or web address], password [facility] 12/14/2015, 9:35 AM  LOS: 1 day

## 2015-12-14 NOTE — Evaluation (Signed)
Occupational Therapy Evaluation Patient Details Name: Helen [name] MRN: [id number] DOB: 24-Aug-1930 Today's Date: 12/14/2015    History of Present Illness Pt is an 82 y.o. female with medical history of hypertension, hyperlipidemia, diet-controlled diabetes, GERD, stroke, CAD, PVD, right upper stenosis, breast cancer (s/p of mastectomy), and migraine headache. Pt presented to the ED with AMS and dizziness/unsteady gait. Pt admitted with dx of acute encephalopathy.    Clinical Impression   Pt reports she was independent with ADLs PTA. Currently pt overall min assist for ADLs and functional mobility. Discussed short term SNF for further rehab prior to return home with pt and family; pt and family are agreeable. Recommend SNF for follow up in order to maximize independence and safety with ADLs and functional mobility. Pt would benefit from continued skilled OT to address established goals.  BP 76/61 in sitting following functional mobility with pt c/o dizziness during mobility. RN notified.    Follow Up Recommendations  SNF;Supervision/Assistance - 24 hour    Equipment Recommendations  Other (comment) (TBD)    Recommendations for Other Services       Precautions / Restrictions Precautions Precautions: Fall Restrictions Weight Bearing Restrictions: No      Mobility Bed Mobility Overal bed mobility: Needs Assistance Bed Mobility: Supine to Sit     Supine to sit: Min assist;HOB elevated     General bed mobility comments: +rail, verbal cues for sequencing  Transfers Overall transfer level: Needs assistance Equipment used: 1 person hand held assist Transfers: Sit to/from Stand Sit to Stand: Min assist Stand pivot transfers: Min assist       General transfer comment: VCs for sequencing and safety.    Balance Overall balance assessment: Needs assistance Sitting-balance support: No upper extremity supported;Feet supported Sitting balance-Leahy Scale: Good      Standing balance support: Single extremity supported Standing balance-Leahy Scale: Poor                              ADL Overall ADL's : Needs assistance/impaired Eating/Feeding: Set up;Sitting   Grooming: Minimal assistance;Standing   Upper Body Bathing: Min guard;Sitting   Lower Body Bathing: Minimal assistance;Sit to/from stand   Upper Body Dressing : Min guard;Sitting   Lower Body Dressing: Minimal assistance;Sit to/from stand   Toilet Transfer: Minimal assistance;Ambulation;BSC (BSC over toilet)   Toileting- Clothing Manipulation and Hygiene: Minimal assistance;Sit to/from stand       Functional mobility during ADLs: Minimal assistance (hand held assist) General ADL Comments: Pt reported dizziness upon initial supine to sit EOB; reported dizziness cleared up. With functional mobility; pt reported increased dizziness. BP taken in sitting following mobility 76/61; feet elevated and back reclined in chair. RN notified.     Vision Vision Assessment?:  (difficult to assess secondary to cognitive deficits, TBA)   Perception     Praxis      Pertinent Vitals/Pain Pain Assessment: No/denies pain     Hand Dominance     Extremity/Trunk Assessment Upper Extremity Assessment Upper Extremity Assessment: RUE deficits/detail RUE Deficits / Details: Pt reports "funny feeling" in R upper arm and R hand. RUE Sensation: decreased light touch   Lower Extremity Assessment Lower Extremity Assessment: Defer to PT evaluation   Cervical / Trunk Assessment Cervical / Trunk Assessment: Kyphotic   Communication Communication Communication: No difficulties   Cognition Arousal/Alertness: Awake/alert Behavior During Therapy: WFL for tasks assessed/performed Overall Cognitive Status: Impaired/Different from baseline Area of Impairment: Orientation;Memory;Following commands;Safety/judgement;Problem solving Orientation  Level: Disoriented to;Time;Situation   Memory:  Decreased short-term memory Following Commands: Follows multi-step commands inconsistently;Follows one step commands with increased time Safety/Judgement: Decreased awareness of safety   Problem Solving: Difficulty sequencing;Requires verbal cues General Comments: Pt is very pleasantly confused. Daughters report the confusion has steadily increased over the last month.   General Comments       Exercises       Shoulder Instructions      Home Living Family/patient expects to be discharged to:: Private residence Living Arrangements: Alone Available Help at Discharge: Family;Available PRN/intermittently Type of Home: Independent living facility [facility]) Home Access: Level entry     Home Layout: One level     Bathroom Shower/Tub: Occupational psychologist: Standard     Home Equipment: Shower seat          Prior Functioning/Environment Level of Independence: Independent;Needs assistance  Gait / Transfers Assistance Needed: mod I without A.D. ADL's / Homemaking Assistance Needed: mod I with ADLs. Has meals in dining room at [place].   Comments: Prior to admission, pt had been receiving meals in her room, instead of walking to the dining room.    OT Diagnosis: Generalized weakness;Cognitive deficits;Altered mental status   OT Problem List: Decreased strength;Decreased activity tolerance;Impaired balance (sitting and/or standing);Decreased cognition;Decreased safety awareness;Decreased knowledge of use of DME or AE;Decreased knowledge of precautions;Cardiopulmonary status limiting activity;Impaired sensation   OT Treatment/Interventions: Self-care/ADL training;Energy conservation;DME and/or AE instruction;Therapeutic activities;Patient/family education;Cognitive remediation/compensation;Balance training    OT Goals(Current goals can be found in the care plan section) Acute Rehab OT Goals Patient Stated Goal: not stated OT Goal Formulation: With  patient/family Time For Goal Achievement: 12/28/15 Potential to Achieve Goals: Good ADL Goals Pt Will Perform Grooming: with supervision;standing Pt Will Perform Upper Body Bathing: with supervision;sitting Pt Will Perform Lower Body Bathing: with supervision;sit to/from stand Pt Will Transfer to Toilet: with min guard assist;ambulating;bedside commode (BSC over toilet) Pt Will Perform Toileting - Clothing Manipulation and hygiene: with supervision;sit to/from stand  OT Frequency: Min 2X/week   Barriers to D/C: Decreased caregiver support  pt lives alone       Co-evaluation PT/OT/SLP Co-Evaluation/Treatment: Yes Reason for Co-Treatment: Necessary to address cognition/behavior during functional activity PT goals addressed during session: Mobility/safety with mobility;Balance OT goals addressed during session: ADL's and self-care;Other (comment) (mobillity)      End of Session Equipment Utilized During Treatment: Gait belt Nurse Communication: Mobility status;Other (comment) (BP down to 76/61)  Activity Tolerance: Patient tolerated treatment well Patient left: in chair;with call bell/phone within reach;with chair alarm set;with family/visitor present   Time: 1411-1447 OT Time Calculation (min): 36 min Charges:  OT General Charges $OT Visit: 1 Procedure OT Evaluation $OT Eval Moderate Complexity: 1 Procedure G-Codes:     Binnie Kand M.S., OTR/L Pager: [phone]  12/14/2015, 4:18 PM

## 2015-12-14 NOTE — Evaluation (Signed)
Physical Therapy Evaluation Patient Details Name: Helen [name] MRN: [id number] DOB: 28-Mar-1930 Today's Date: 12/14/2015   History of Present Illness  Pt is an 82 y.o. female with medical history of hypertension, hyperlipidemia, diet-controlled diabetes, GERD, stroke, CAD, PVD, right upper stenosis, breast cancer (s/p of mastectomy), and migraine headache. Pt presented to the ED with AMS and dizziness/unsteady gait. Pt admitted with dx of acute encephalopathy.   Clinical Impression  Pt admitted with above diagnosis. Pt currently with functional limitations due to the deficits listed below (see PT Problem List). On eval, pt required min assist for all functional mobility. Pt with c/o dizziness during ambulation. BP taken upon return to recliner in room and found to be 76/61. RN notified. Pt positioned reclined in bedside recliner. Pt will benefit from skilled PT to increase their independence and safety with mobility to allow discharge to the venue listed below.       Follow Up Recommendations SNF;Supervision/Assistance - 24 hour    Equipment Recommendations  Other (comment) (TBD)    Recommendations for Other Services       Precautions / Restrictions Precautions Precautions: Fall      Mobility  Bed Mobility Overal bed mobility: Needs Assistance Bed Mobility: Supine to Sit     Supine to sit: Min assist;HOB elevated     General bed mobility comments: +rail, verbal cues for sequencing  Transfers Overall transfer level: Needs assistance Equipment used: 1 person hand held assist Transfers: Sit to/from [facility] Sit to Stand: Min assist Stand pivot transfers: Min assist       General transfer comment: verbal cues for sequencing and safety  Ambulation/Gait Ambulation/Gait assistance: Min assist Ambulation Distance (Feet): 125 Feet Assistive device: 1 person hand held assist Gait Pattern/deviations: Step-through pattern;Shuffle;Decreased stride  length Gait velocity: decreased Gait velocity interpretation: Below normal speed for age/gender    Stairs            Wheelchair Mobility    Modified Rankin (Stroke Patients Only)       Balance Overall balance assessment: Needs assistance Sitting-balance support: No upper extremity supported;Feet supported Sitting balance-Leahy Scale: Good     Standing balance support: Single extremity supported;During functional activity Standing balance-Leahy Scale: Poor                               Pertinent Vitals/Pain Pain Assessment: No/denies pain    Home Living Family/patient expects to be discharged to:: Private residence Living Arrangements: Alone Available Help at Discharge: Family;Available PRN/intermittently Type of Home: Independent living facility [facility]) Home Access: Level entry     Home Layout: One level Home Equipment: Shower seat      Prior Function Level of Independence: Independent;Needs assistance   Gait / Transfers Assistance Needed: mod I without A.D.  ADL's / Homemaking Assistance Needed: mod I with ADLs. Has meals in dining room at [place].  Comments: Prior to admission, pt had been receiving meals in her room, instead of walking to the dining room.     Hand Dominance        Extremity/Trunk Assessment   Upper Extremity Assessment: Defer to OT evaluation           Lower Extremity Assessment: Generalized weakness      Cervical / Trunk Assessment: Kyphotic  Communication   Communication: No difficulties  Cognition Arousal/Alertness: Awake/alert Behavior During Therapy: WFL for tasks assessed/performed Overall Cognitive Status: Impaired/Different from baseline Area of Impairment: Orientation;Memory;Following  commands;Safety/judgement;Problem solving Orientation Level: Disoriented to;Time;Situation   Memory: Decreased short-term memory Following Commands: Follows multi-step commands inconsistently;Follows one step  commands with increased time Safety/Judgement: Decreased awareness of safety   Problem Solving: Difficulty sequencing;Requires verbal cues General Comments: Pt is very pleasantly confused. Daughters report the confusion has steadily increased over the last month.    General Comments      Exercises        Assessment/Plan    PT Assessment Patient needs continued PT services  PT Diagnosis Difficulty walking;Altered mental status   PT Problem List Decreased strength;Decreased activity tolerance;Decreased balance;Decreased mobility;Decreased cognition;Decreased knowledge of use of DME;Decreased safety awareness  PT Treatment Interventions DME instruction;Gait training;Functional mobility training;Therapeutic activities;Therapeutic exercise;Patient/family education;Cognitive remediation;Balance training   PT Goals (Current goals can be found in the Care Plan section) Acute Rehab PT Goals Patient Stated Goal: not stated PT Goal Formulation: With patient/family Time For Goal Achievement: 12/28/15 Potential to Achieve Goals: Good    Frequency Min 3X/week   Barriers to discharge Decreased caregiver support      Co-evaluation PT/OT/SLP Co-Evaluation/Treatment: Yes Reason for Co-Treatment: Necessary to address cognition/behavior during functional activity PT goals addressed during session: Mobility/safety with mobility;Balance         End of Session Equipment Utilized During Treatment: Gait belt Activity Tolerance: Patient tolerated treatment well Patient left: in chair;with call bell/phone within reach;with chair alarm set;with family/visitor present Nurse Communication: Mobility status         Time: [id number] PT Time Calculation (min) (ACUTE ONLY): 37 min   Charges:   PT Evaluation $PT Eval Moderate Complexity: 1 Procedure     PT G CodesLorriane Shire 12/14/2015, 3:14 PM

## 2015-12-14 NOTE — Progress Notes (Signed)
Initial Nutrition Assessment  DOCUMENTATION CODES:   Not applicable  INTERVENTION:   Ensure Enlive po BID, each supplement provides 350 kcal and 20 grams of protein  NUTRITION DIAGNOSIS:   Increased nutrient needs related to acute illness as evidenced by estimated needs  GOAL:   Patient will meet greater than or equal to 90% of their needs  MONITOR:   PO intake, Supplement acceptance, Labs, Weight trends, I & O's  REASON FOR ASSESSMENT:   Malnutrition Screening Tool  ASSESSMENT:   82 yo Female who presented with acute onset of lapse and expressive aphasia starting at 1640 today. Patient was walking with assistance when she collapsed. Daughter states that she was awake and trying to speak but that it was unintelligible. Symptoms lasted roughly 5 minutes. There was no trauma. Patient did not hit her head or neck. Patient's had increasing confusion for the past month and especially worsening in the last few days. She normally lives alone and cares for herself. She's been seen by her primary physician and had labs drawn. Significant for hyponatremia and leukocytosis. Given dose of IV Rocephin though UA without definite evidence of UTI. Patient is confused but denies any pain currently. Pacific lesion denies any chest pain, shortness of breath, abdominal pain. No headache.   Patient eating breakfast upon RD visit; family members visiting. Has had AMS and family reveals this is the first time pt has eaten in a few days. No recent unintentional weight loss reported. Pt would like vanilla Ensure Enlive >> RD to order during hospital stay.  RD unable to complete Nutrition Focused Physical Exam at this time.  Diet Order:  Diet NPO time specified  Skin:  Reviewed, no issues  Last BM:  N/A  Height:   Ht Readings from Last 1 Encounters:  12/14/15 5\' 3"  (1.6 m)    Weight:   Wt Readings from Last 1 Encounters:  12/14/15 148 lb (67.132 kg)    Ideal Body Weight:  52.2  kg  BMI:  Body mass index is 26.22 kg/(m^2).  Estimated Nutritional Needs:   Kcal:  1400-1600  Protein:  55-65 gm  Fluid:  >/= 1.5 L  EDUCATION NEEDS:   No education needs identified at this time  Arthur Holms, RD, LDN Pager #: [phone] After-Hours Pager #: [phone]

## 2015-12-14 NOTE — Progress Notes (Signed)
Patient arrived to 5C09 at 2315, alert and confused. Patient oriented to room, equipment and all safety measures. MAEW. VSS. Denies any pain or discomfort. Daughters at bedside. Will monitor closely overnight.

## 2015-12-15 ENCOUNTER — Inpatient Hospital Stay (HOSPITAL_COMMUNITY): Payer: Medicare Other

## 2015-12-15 ENCOUNTER — Encounter (HOSPITAL_COMMUNITY): Payer: Self-pay

## 2015-12-15 DIAGNOSIS — I6523 Occlusion and stenosis of bilateral carotid arteries: Secondary | ICD-10-CM

## 2015-12-15 LAB — BASIC METABOLIC PANEL
Anion gap: 7 (ref 5–15)
Anion gap: 10 (ref 5–15)
BUN: 13 mg/dL (ref 6–20)
BUN: 19 mg/dL (ref 6–20)
CO2: 25 mmol/L (ref 22–32)
CO2: 23 mmol/L (ref 22–32)
Calcium: 8.1 mg/dL — ABNORMAL LOW (ref 8.9–10.3)
Calcium: 7.8 mg/dL — ABNORMAL LOW (ref 8.9–10.3)
Chloride: 95 mmol/L — ABNORMAL LOW (ref 101–111)
Chloride: 98 mmol/L — ABNORMAL LOW (ref 101–111)
Creatinine, Ser: 0.71 mg/dL (ref 0.44–1.00)
Creatinine, Ser: 0.75 mg/dL (ref 0.44–1.00)
GFR calc Af Amer: >60 mL/min (ref >60)
GFR calc Af Amer: >60 mL/min (ref >60)
GFR calc non Af Amer: >60 mL/min (ref >60)
GFR calc non Af Amer: >60 mL/min (ref >60)
Glucose, Bld: 112 mg/dL — ABNORMAL HIGH (ref 65–99)
Glucose, Bld: 140 mg/dL — ABNORMAL HIGH (ref 65–99)
Potassium: 4.4 mmol/L (ref 3.5–5.1)
Potassium: 4.1 mmol/L (ref 3.5–5.1)
Sodium: 127 mmol/L — ABNORMAL LOW (ref 135–145)
Sodium: 131 mmol/L — ABNORMAL LOW (ref 135–145)

## 2015-12-15 LAB — CBC
HCT: 33.8 % — ABNORMAL LOW (ref 36.0–46.0)
Hemoglobin: 11.7 g/dL — ABNORMAL LOW (ref 12.0–15.0)
MCH: 28.5 pg (ref 26.0–34.0)
MCHC: 34.6 g/dL (ref 30.0–36.0)
MCV: 82.2 fL (ref 78.0–100.0)
Platelets: 195 10*3/uL (ref 150–400)
RBC: 4.11 MIL/uL (ref 3.87–5.11)
RDW: 14.7 % (ref 11.5–15.5)
WBC: 11.2 10*3/uL — ABNORMAL HIGH (ref 4.0–10.5)

## 2015-12-15 LAB — URINE CULTURE

## 2015-12-15 LAB — GLUCOSE, CAPILLARY: Glucose-Capillary: 82 mg/dL (ref 65–99)

## 2015-12-15 MED ORDER — SODIUM CHLORIDE 0.9 % IV SOLN
INTRAVENOUS | Status: DC
  Administered 2015-12-15: 10:00:00 via INTRAVENOUS
  Filled 2015-12-15 (×3): qty 1000

## 2015-12-15 NOTE — Progress Notes (Addendum)
*  PRELIMINARY RESULTS* Vascular Ultrasound Carotid Duplex (Doppler) has been completed.  Preliminary findings: Bilateral: No significant (1-39%) ICA stenosis. Antegrade vertebral flow.  Left ECA  >50% stenosis. Elevated velocities noted at right proximal CCA.   Landry Mellow, RDMS, RVT  12/15/2015, 8:24 AM

## 2015-12-15 NOTE — Progress Notes (Signed)
[facility] PROGRESS NOTE  Helen [name] [id number] DOB: Nov 01, 1929 DOA: 12/13/2015   PCP: [name], MD     Assessment/Plan: Principal Problem:   Acute encephalopathy Active Problems:   Carotid stenosis   Hypercholesteremia   Hypertension   Diabetes mellitus without complication ([place])   Stroke ([place])   CAD (coronary artery disease)   Peripheral vascular disease ([place])   Hyponatremia   Hypokalemia   Leukocytosis   Essential hypertension   Pressley Valenzano is a 82 y.o. female with medical history significant of hypertension, hyperlipidemia, diet-controlled diabetes, GERD, stroke, CAD, PVD, right upper stenosis, breast cancer (s/p of mastectomy), migraine headache, who presents with AMS.  Per patient's daughter, patient becomes confused and has poor balance. She may also have transient expressive aphasia at about 4:00 to 5:00 PM. Per her daughters, patient was walking with assistance when she almost collapsed. Daughter states that she was awake and trying to speak, but that it was unintelligible. Symptoms lasted roughly 5 minutes. . Patient was seen by her PCP, and was given one gram of Rocephin intramuscularly injection due to leukocytosis. Patient had negative chest x-ray and negative urinalysis for UTI in PCPs office. She was given prescription of Levaquin, but patient has not started taking this medication yet. ED Course: pt was found to have leukocytosis with WBC 17.7, INR 1.05, PTT 28, negative troponin, temperature normal, no tachycardia, potassium 3.2, sodium 115, creatinine normal, negative CT head for acute intracranial abnormalities. Patient's admitted to inpatient for further eval and treatment.  Assessment and plan Acute encephalopathy: Etiology is not clear, potential differential diagnosis include hyponatremia, known carotid artery stenosis and TIA/stroke. Slowly improving -cont  tele -sinus rhythm with PACs -Frequent neuro check stable -Correct  Hyponatremia as below MRI brain no acute stroke remote lacunar infarct Carotid Doppler.  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 Zoloft, ramipril and Depakote use may have also contributed.   hyponatremia may  be the   Etiology,acute decline since 2/28  -Hold Zoloft, ramipril and Depakote   urine sodium, and urine and plasma osm nl , TSH 2.0 , cortisol level- 32.3 Improving sodium, continue to follow BMP serially Chest x-ray negative for pneumonia  Bilateral carotid artery stenosis: Carotid artery Doppler on 02/14/15 showed left internal carotid artery approaching the lower end of the 50-69% and interval progression of right-sided atherosclerotic plaque within the right common carotid artery, likely resulting in a hemodynamically significant stenosis within the proximal CCA. suspected progression of atherosclerotic plaque within the right carotid bulb and proximal aspect of the right internal carotid artery though not definitely resulting in a hemodynamically significant stenosis.  Pt was seen by vascular surgeon, Dr. Bridgett Larsson on 03/13/15, who recommended maximize the medical treatment. -continue plavix and lipitor -repeat carotid artery doppler -No significant (1-39%) ICA stenosis,Left ECA >50% stenosis. Elevated velocities noted at right proximal CCA   Diet controlled DM-II: Last A1c not on record. Patient is not taking med at home. CBG=129 on admission -monitoring CBG-->qAM CBG Hemoglobin A1c pending  HLD: Last LDL was not on record -Continue home medications: lipitor LDL 65, triglycerides 100  HTN: -Hold ramipril due to low Na level -continue atenolol -IV hydralazine  Hx of stroke: -On plavix and lipitor  CAD: no CP. Trop negative  -continue plavix, atenolol and lipiotor  Hypokalemia: K= 3.2 on admission. - Repleted - Check Mg level 1.6    Leukocytosis: WBC 17.7. Etiology is not  clear.repeat CXR,  She  denies symptoms of UTI. She does not have fever. No source of infection identified. Patient received 1 dose of Rocephin intramuscular injection in PCP's office. -Wil hold off antibiotics now, but will start antibiotics if patient develops fever -Follow-up blood culture, urine culture and UA  Depression:  -will hold home medications, zoloft which may have contributed to hyponatremia, SIADH  Migraine headaches: No headache now. -Hold Depakote due to AMS and hyponatremia. Apparently has not taken Depakote for the last 1 month    DVT prophylaxsis Lovenox  Code Status:  Full code     Family Communication: Discussed in detail with the patient/daughters, all imaging results, lab results explained to the patient   Disposition Plan:  Anticipate discharge in 2-3 days      Consultants: None   Procedures:  None   Antibiotics: Anti-infectives    None         HPI/Subjective: Patient's confusion has improved, daughter is by the bedside, and she agrees,  Objective: Filed Vitals:   12/14/15 2116 12/15/15 0121 12/15/15 0536 12/15/15 0934  BP: 126/61 120/68 150/59 145/67  Pulse: 66 58 64 67  Temp: 98.2 F (36.8 C) 98.1 F (36.7 C) 98 F (36.7 C) 98.1 F (36.7 C)  TempSrc: Oral Oral Oral Oral  Resp: 20 20 20 18   Height:      Weight:      SpO2: 97% 97% 97% 95%    Intake/Output Summary (Last 24 hours) at 12/15/15 0951 Last data filed at 12/15/15 0029  Gross per 24 hour  Intake    600 ml  Output   1100 ml  Net   -500 ml    Exam:  Examination:  General: Not in acute distress HEENT:  Eyes: PERRL, EOMI, no scleral icterus.  ENT: No discharge from the ears and nose, no pharynx injection, no tonsillar enlargement.   Neck: No JVD, no bruit, no mass felt. Heme: No neck lymph node enlargement. Cardiac: S1/S2, RRR, No murmurs, No gallops or rubs. Pulm: No rales, wheezing, rhonchi or rubs. Abd: Soft, nondistended, nontender, no rebound pain, no  organomegaly, BS present. GU: No hematuria Ext: No pitting leg edema bilaterally. 2+DP/PT pulse bilaterally. Musculoskeletal: No joint deformities, No joint redness or warmth, no limitation of ROM in spin. Skin: No rashes.  Neuro: Alert, not oriented to place and time. She knows her daguhters, cranial nerves II-XII grossly intact, moves all extremities normally. Muscle strength 5/5 in all extremities, sensation to light touch intact.Knee reflex 1+ bilaterally. Negative Babinski's sign. Psych: Patient is not psychotic, no suicidal or hemocidal ideation.   Data Reviewed: I have personally reviewed following labs and imaging studies  Micro Results No results found for this or any previous visit (from the past 240 hour(s)).  Radiology Reports Ct Head Wo Contrast  12/13/2015  CLINICAL DATA:  Expressive aphasia. Hx HTN, stroke, and diabetes. EXAM: CT HEAD WITHOUT CONTRAST TECHNIQUE: Contiguous axial images were obtained from the base of the skull through the vertex without intravenous contrast. COMPARISON:  03/08/2015 FINDINGS: There is significant atrophy and small vessel disease. Chronic lacunar infarcts are identified within the left basal ganglia and left periventricular white matter. There is no intra or extra-axial fluid collection or mass lesion. The basilar cisterns and ventricles have a normal appearance. There is no CT evidence for acute infarction or hemorrhage. Bone windows demonstrate previous sinus surgery. There is atherosclerotic calcification of the internal carotid arteries. IMPRESSION: 1. Atrophy and small vessel disease. 2. Chronic ischemic  changes involving the left periventricular white matter and basal ganglia. 3.  No evidence for acute intracranial abnormality. Electronically Signed   By: Nolon Nations M.D.   On: 12/13/2015 19:05   Mr Brain Wo Contrast  12/13/2015  CLINICAL DATA:  Initial evaluation for acute encephalopathy. Possible transient aphasia. EXAM: MRI HEAD WITHOUT  CONTRAST TECHNIQUE: Multiplanar, multiecho pulse sequences of the brain and surrounding structures were obtained without intravenous contrast. COMPARISON:  Prior head CT from earlier same day. FINDINGS: Study mildly degraded by motion artifact. Diffuse prominence of the CSF containing spaces compatible with generalized age-related cerebral atrophy. Patchy T2/FLAIR hyperintensity within the periventricular and deep white matter both cerebral hemispheres most consistent with chronic small vessel ischemic disease, mild for patient age. Chronic small vessel ischemic type changes present within the central pons as well. Remote lacunar infarct present within the left basal ganglia. No abnormal foci of restricted diffusion to suggest acute intracranial infarct. Gray-white matter differentiation maintained. Major intracranial vascular flow voids are preserved. No acute or chronic intracranial hemorrhage. No mass lesion, midline shift, or mass effect. No hydrocephalus. No extra-axial fluid collection. Major dural sinuses are grossly patent. Craniocervical junction within normal limits. Visualized upper cervical spine demonstrates no acute abnormality. Pituitary gland normal. No acute abnormality about the orbits. Sequela prior bilateral lens extraction noted. Paranasal sinuses are largely clear. No mastoid effusion. Inner ear structures grossly normal. Bone marrow signal intensity within normal limits. Scalp soft tissues demonstrate no acute process. IMPRESSION: 1. No acute intracranial infarct or other process identified. 2. Age-related cerebral atrophy with mild chronic small vessel ischemic disease. 3. Remote lacunar infarct within the left basal ganglia. Electronically Signed   By: Jeannine Boga M.D.   On: 12/13/2015 22:38   Dg Chest Port 1 View  12/14/2015  CLINICAL DATA:  hyponatremia EXAM: PORTABLE CHEST - 1 VIEW COMPARISON:  07/01/2011 FINDINGS: Attenuated bronchovascular markings in the right lung as  before, with some rightward mediastinal shift and distortion of the right hilum, stable. Surgical clips right axilla. No new infiltrate. No overt edema. No effusion. No pneumothorax. Heart size normal. Visualized skeletal structures are unremarkable. IMPRESSION: 1. Chronic right lung changes. No acute or superimposed abnormality. Electronically Signed   By: Lucrezia Europe M.D.   On: 12/14/2015 09:53     CBC  Recent Labs Lab 12/13/15 1820 12/14/15 0019 12/15/15 0531  WBC 17.7* 17.6* 11.2*  HGB 14.6 14.4 11.7*  HCT 40.5 39.6 33.8*  PLT 253 236 195  MCV 80.4 80.8 82.2  MCH 29.0 29.4 28.5  MCHC 36.0 36.4* 34.6  RDW 14.2 14.3 14.7    Chemistries   Recent Labs Lab 12/13/15 1820 12/14/15 0017 12/14/15 0019 12/14/15 0744 12/14/15 1059 12/14/15 2058  NA 115* 118*  --  120* 120* 123*  K 3.2* 3.0*  --  3.2* 3.3* 3.7  CL 72* 78*  --  85* 84* 89*  CO2 28 28  --  23 23 27   GLUCOSE 129* 111*  --  111* 138* 109*  BUN 23* 15  --  12 14 17   CREATININE 0.82 0.78  --  0.56 0.61 0.82  CALCIUM 9.1 8.6*  --  8.1* 8.2* 7.9*  MG  --   --  1.6*  --   --   --   AST  --   --   --   --  36 23  ALT  --   --   --   --  26 21  ALKPHOS  --   --   --   --  49 41  BILITOT  --   --   --   --  1.2 1.0   ------------------------------------------------------------------------------------------------------------------ estimated creatinine clearance is 47 mL/min (by C-G formula based on Cr of 0.82). ------------------------------------------------------------------------------------------------------------------ No results for input(s): HGBA1C in the last 72 hours. ------------------------------------------------------------------------------------------------------------------  Recent Labs  12/14/15 0017  CHOL 118  HDL 33*  LDLCALC 65  TRIG 100  CHOLHDL 3.6   ------------------------------------------------------------------------------------------------------------------  Recent Labs   12/14/15 0019  TSH 2.058   ------------------------------------------------------------------------------------------------------------------ No results for input(s): VITAMINB12, FOLATE, FERRITIN, TIBC, IRON, RETICCTPCT in the last 72 hours.  Coagulation profile  Recent Labs Lab 12/13/15 1800  INR 1.05    No results for input(s): DDIMER in the last 72 hours.  Cardiac Enzymes No results for input(s): CKMB, TROPONINI, MYOGLOBIN in the last 168 hours.  Invalid input(s): CK ------------------------------------------------------------------------------------------------------------------ Invalid input(s): POCBNP   CBG:  Recent Labs Lab 12/14/15 0609 12/15/15 0618  GLUCAP 102* 82       Studies: Ct Head Wo Contrast  12/13/2015  CLINICAL DATA:  Expressive aphasia. Hx HTN, stroke, and diabetes. EXAM: CT HEAD WITHOUT CONTRAST TECHNIQUE: Contiguous axial images were obtained from the base of the skull through the vertex without intravenous contrast. COMPARISON:  03/08/2015 FINDINGS: There is significant atrophy and small vessel disease. Chronic lacunar infarcts are identified within the left basal ganglia and left periventricular white matter. There is no intra or extra-axial fluid collection or mass lesion. The basilar cisterns and ventricles have a normal appearance. There is no CT evidence for acute infarction or hemorrhage. Bone windows demonstrate previous sinus surgery. There is atherosclerotic calcification of the internal carotid arteries. IMPRESSION: 1. Atrophy and small vessel disease. 2. Chronic ischemic changes involving the left periventricular white matter and basal ganglia. 3.  No evidence for acute intracranial abnormality. Electronically Signed   By: Nolon Nations M.D.   On: 12/13/2015 19:05   Mr Brain Wo Contrast  12/13/2015  CLINICAL DATA:  Initial evaluation for acute encephalopathy. Possible transient aphasia. EXAM: MRI HEAD WITHOUT CONTRAST TECHNIQUE:  Multiplanar, multiecho pulse sequences of the brain and surrounding structures were obtained without intravenous contrast. COMPARISON:  Prior head CT from earlier same day. FINDINGS: Study mildly degraded by motion artifact. Diffuse prominence of the CSF containing spaces compatible with generalized age-related cerebral atrophy. Patchy T2/FLAIR hyperintensity within the periventricular and deep white matter both cerebral hemispheres most consistent with chronic small vessel ischemic disease, mild for patient age. Chronic small vessel ischemic type changes present within the central pons as well. Remote lacunar infarct present within the left basal ganglia. No abnormal foci of restricted diffusion to suggest acute intracranial infarct. Gray-white matter differentiation maintained. Major intracranial vascular flow voids are preserved. No acute or chronic intracranial hemorrhage. No mass lesion, midline shift, or mass effect. No hydrocephalus. No extra-axial fluid collection. Major dural sinuses are grossly patent. Craniocervical junction within normal limits. Visualized upper cervical spine demonstrates no acute abnormality. Pituitary gland normal. No acute abnormality about the orbits. Sequela prior bilateral lens extraction noted. Paranasal sinuses are largely clear. No mastoid effusion. Inner ear structures grossly normal. Bone marrow signal intensity within normal limits. Scalp soft tissues demonstrate no acute process. IMPRESSION: 1. No acute intracranial infarct or other process identified. 2. Age-related cerebral atrophy with mild chronic small vessel ischemic disease. 3. Remote lacunar infarct within the left basal ganglia. Electronically Signed   By: Jeannine Boga M.D.   On: 12/13/2015 22:38   Dg Chest Port 1 View  12/14/2015  CLINICAL DATA:  hyponatremia EXAM: PORTABLE CHEST - 1 VIEW COMPARISON:  07/01/2011 FINDINGS: Attenuated bronchovascular markings in the right lung as before, with some  rightward mediastinal shift and distortion of the right hilum, stable. Surgical clips right axilla. No new infiltrate. No overt edema. No effusion. No pneumothorax. Heart size normal. Visualized skeletal structures are unremarkable. IMPRESSION: 1. Chronic right lung changes. No acute or superimposed abnormality. Electronically Signed   By: Lucrezia Europe M.D.   On: 12/14/2015 09:53      No results found for: HGBA1C Lab Results  Component Value Date   LDLCALC 65 12/14/2015   CREATININE 0.82 12/14/2015       Scheduled Meds: . atenolol  100 mg Oral Daily  . atorvastatin  80 mg Oral QHS  . calcium carbonate  1,000 mg of elemental calcium Oral Q breakfast  . cholecalciferol  2,000 Units Oral Daily  . clopidogrel  75 mg Oral Daily  . enoxaparin (LOVENOX) injection  40 mg Subcutaneous QHS  . feeding supplement (ENSURE ENLIVE)  237 mL Oral BID BM  . multivitamin with minerals  1 tablet Oral Daily  . pantoprazole  40 mg Oral Daily  . sodium chloride flush  3 mL Intravenous Q12H   Continuous Infusions: . 0.9 % sodium chloride with kcl       LOS: 2 days    Time spent: >30 MINS    [facility] Pager [phone]. If 7PM-7AM, please contact night-coverage at [email or web address], password [email or web address] 12/15/2015, 9:51 AM  LOS: 2 days

## 2015-12-16 ENCOUNTER — Inpatient Hospital Stay (HOSPITAL_COMMUNITY): Payer: Medicare Other

## 2015-12-16 LAB — CBC WITH DIFFERENTIAL/PLATELET
Basophils Absolute: 0 10*3/uL (ref 0.0–0.1)
Basophils Relative: 0 %
Eosinophils Absolute: 0.1 10*3/uL (ref 0.0–0.7)
Eosinophils Relative: 1 %
HCT: 39.7 % (ref 36.0–46.0)
Hemoglobin: 13.8 g/dL (ref 12.0–15.0)
Lymphocytes Relative: 7 %
Lymphs Abs: 1.4 10*3/uL (ref 0.7–4.0)
MCH: 28.5 pg (ref 26.0–34.0)
MCHC: 34.8 g/dL (ref 30.0–36.0)
MCV: 81.9 fL (ref 78.0–100.0)
Monocytes Absolute: 1.7 10*3/uL — ABNORMAL HIGH (ref 0.1–1.0)
Monocytes Relative: 9 %
Neutro Abs: 16 10*3/uL — ABNORMAL HIGH (ref 1.7–7.7)
Neutrophils Relative %: 83 %
Platelets: 209 10*3/uL (ref 150–400)
RBC: 4.85 MIL/uL (ref 3.87–5.11)
RDW: 14.8 % (ref 11.5–15.5)
WBC: 19.2 10*3/uL — ABNORMAL HIGH (ref 4.0–10.5)

## 2015-12-16 LAB — BASIC METABOLIC PANEL
Anion gap: 9 (ref 5–15)
BUN: 17 mg/dL (ref 6–20)
CO2: 26 mmol/L (ref 22–32)
Calcium: 8.4 mg/dL — ABNORMAL LOW (ref 8.9–10.3)
Chloride: 94 mmol/L — ABNORMAL LOW (ref 101–111)
Creatinine, Ser: 0.56 mg/dL (ref 0.44–1.00)
GFR calc Af Amer: >60 mL/min (ref >60)
GFR calc non Af Amer: >60 mL/min (ref >60)
Glucose, Bld: 128 mg/dL — ABNORMAL HIGH (ref 65–99)
Potassium: 4.1 mmol/L (ref 3.5–5.1)
Sodium: 129 mmol/L — ABNORMAL LOW (ref 135–145)

## 2015-12-16 LAB — URINALYSIS, ROUTINE W REFLEX MICROSCOPIC
Bilirubin Urine: NEGATIVE
Glucose, UA: NEGATIVE mg/dL
Ketones, ur: NEGATIVE mg/dL
Leukocytes, UA: NEGATIVE
Nitrite: NEGATIVE
Protein, ur: 30 mg/dL — AB
Specific Gravity, Urine: 1.013 (ref 1.005–1.030)
pH: 7.5 (ref 5.0–8.0)

## 2015-12-16 LAB — URINE MICROSCOPIC-ADD ON: Bacteria, UA: NONE SEEN

## 2015-12-16 LAB — HEMOGLOBIN A1C
Hgb A1c MFr Bld: 6.8 % — ABNORMAL HIGH (ref 4.8–5.6)
Mean Plasma Glucose: 148 mg/dL

## 2015-12-16 LAB — GLUCOSE, CAPILLARY: Glucose-Capillary: 115 mg/dL — ABNORMAL HIGH (ref 65–99)

## 2015-12-16 MED ORDER — TRAZODONE HCL 50 MG PO TABS
50.0000 mg | ORAL_TABLET | Freq: Every evening | ORAL | Status: DC | PRN
  Administered 2015-12-16 – 2015-12-17 (×2): 50 mg via ORAL
  Filled 2015-12-16 (×2): qty 1

## 2015-12-16 MED ORDER — DEXTROSE 5 % IV SOLN
1.0000 g | INTRAVENOUS | Status: DC
  Administered 2015-12-16 – 2015-12-18 (×3): 1 g via INTRAVENOUS
  Filled 2015-12-16 (×4): qty 10

## 2015-12-16 MED ORDER — CEPHALEXIN 500 MG PO CAPS: 500.0000 mg | ORAL_CAPSULE | Freq: Three times a day (TID) | ORAL | Status: DC

## 2015-12-16 NOTE — Progress Notes (Signed)
Bladder scan performed. Greater than 37mls urine noted. Foley catheter placed per MD order. Urine specimen obtained from foley and sent to lab for suspected urinary tract infection. Pt tol well. Wendee Copp

## 2015-12-16 NOTE — Progress Notes (Addendum)
[facility] PROGRESS NOTE  [name] [id number] DOB: 08-Feb-1930 DOA: 12/13/2015   PCP: [name], MD     Assessment/Plan: Principal Problem:   Acute encephalopathy Active Problems:   Carotid stenosis   Hypercholesteremia   Hypertension   Diabetes mellitus without complication ([place])   Stroke ([place])   CAD (coronary artery disease)   Peripheral vascular disease ([place])   Hyponatremia   Hypokalemia   Leukocytosis   Essential hypertension   Helen [name] is a 82 y.o. female with medical history significant of hypertension, hyperlipidemia, diet-controlled diabetes, GERD, stroke, CAD, PVD, right upper stenosis, breast cancer (s/p of mastectomy), migraine headache, who presents with AMS.  Per patient's daughter, patient becomes confused and has poor balance. She may also have transient expressive aphasia at about 4:00 to 5:00 PM. Per her daughters, patient was walking with assistance when she almost collapsed. Daughter states that she was awake and trying to speak, but that it was unintelligible. Symptoms lasted roughly 5 minutes. . Patient was seen by her PCP, and was given one gram of Rocephin intramuscularly injection due to leukocytosis. Patient had negative chest x-ray and negative urinalysis for UTI in PCPs office. She was given prescription of Levaquin, but patient has not started taking this medication yet. ED Course: pt was found to have leukocytosis with WBC 17.7, INR 1.05, PTT 28, negative troponin, temperature normal, no tachycardia, potassium 3.2, sodium 115, creatinine normal, negative CT head for acute intracranial abnormalities. Patient's admitted to inpatient for further eval and treatment.  Assessment and plan Acute encephalopathy: Etiology is not clear, potential differential diagnosis include hyponatremia, known carotid artery stenosis and TIA/stroke. 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 Zoloft, ramipril and Depakote use may have also contributed.   hyponatremia may  be the   Etiology,acute decline since 2/28  -Hold Zoloft, ramipril and Depakote   urine sodium, and urine and plasma osm nl , TSH 2.0 , cortisol level- 32.3 Discontinue IV fluids and monitor sodium Repeat chest x-ray for pneumonia , given leukocytosis   Leukocytosis-unclear etiology, chest x-ray negative, UA slightly positive, patient likely has a urinary tract infection flareup secondary to urinary retention. Start patient on Rocephin, repeat UA and urine culture, follow white count, replace Foley and follow up with outpatient neurology for urinary retention.     Bilateral carotid artery stenosis: Carotid artery Doppler on 02/14/15 showed left internal carotid artery approaching the lower end of the 50-69% and interval progression of right-sided atherosclerotic plaque within the right common carotid artery, likely resulting in a hemodynamically significant stenosis within the proximal CCA. suspected progression of atherosclerotic plaque within the right carotid bulb and proximal aspect of the right internal carotid artery though not definitely resulting in a hemodynamically significant stenosis.  Pt was seen by vascular surgeon, Dr. Bridgett Larsson on 03/13/15, who recommended maximize the medical treatment. -continue plavix and lipitor -repeat carotid artery doppler -No significant (1-39%) ICA stenosis,Left ECA >50% stenosis. Elevated velocities noted at right proximal CCA   Diet controlled DM-II: Last A1c not on record. Patient is not taking med at home. CBG=129 on admission -monitoring CBG-->qAM CBG Hemoglobin A1c pending  HLD: Last LDL was not on record -Continue home medications: lipitor LDL 65, triglycerides 100  HTN: -Hold ramipril due to  low Na level -continue atenolol -IV hydralazine  Hx of stroke: -On plavix and lipitor  CAD: no CP. Trop negative  -continue plavix, atenolol and lipiotor  Hypokalemia: K= 3.2 on admission. - Repleted    Depression:  -will hold home medications, zoloft which may have contributed to hyponatremia, SIADH  Migraine headaches: No headache now. -Hold Depakote due to AMS and hyponatremia. Apparently has not taken Depakote for the last 1 month    DVT prophylaxsis Lovenox  Code Status:  Full code     Family Communication: Discussed in detail with the patient/daughters, all imaging results, lab results explained to the patient   Disposition Plan:  Anticipate discharge tomorrow to SNF      Consultants: None   Procedures:  None   Antibiotics: Anti-infectives    Start     Dose/Rate Route Frequency Ordered Stop   12/16/15 0915  cephALEXin (KEFLEX) capsule 500 mg     500 mg Oral Every 8 hours 12/16/15 0912           HPI/Subjective:  Unable to void on her own, needed in and out cath this morning   Objective: Filed Vitals:   12/15/15 2051 12/15/15 2222 12/16/15 0224 12/16/15 0600  BP: 185/98 164/75 163/74 140/56  Pulse: 100  87 76  Temp: 98 F (36.7 C)  98.3 F (36.8 C) 98.5 F (36.9 C)  TempSrc: Oral  Oral Oral  Resp: 18  18 18   Height:      Weight:      SpO2: 100%  98% 98%    Intake/Output Summary (Last 24 hours) at 12/16/15 0913 Last data filed at 12/16/15 0553  Gross per 24 hour  Intake      0 ml  Output    689 ml  Net   -689 ml    Exam:  Examination:  General: Not in acute distress HEENT:  Eyes: PERRL, EOMI, no scleral icterus.  ENT: No discharge from the ears and nose, no pharynx injection, no tonsillar enlargement.   Neck: No JVD, no bruit, no mass felt. Heme: No neck lymph node enlargement. Cardiac: S1/S2, RRR, No murmurs, No gallops or rubs. Pulm: No rales, wheezing, rhonchi or rubs. Abd: Soft, nondistended,  nontender, no rebound pain, no organomegaly, BS present. GU: No hematuria Ext: No pitting leg edema bilaterally. 2+DP/PT pulse bilaterally. Musculoskeletal: No joint deformities, No joint redness or warmth, no limitation of ROM in spin. Skin: No rashes.  Neuro: Alert, not oriented to place and time. She knows her daguhters, cranial nerves II-XII grossly intact, moves all extremities normally. Muscle strength 5/5 in all extremities, sensation to light touch intact.Knee reflex 1+ bilaterally. Negative Babinski's sign. Psych: Patient is not psychotic, no suicidal or hemocidal ideation.   Data Reviewed: I have personally reviewed following labs and imaging studies  Micro Results Recent Results (from the past 240 hour(s))  Urine culture     Status: None   Collection Time: 12/13/15  8:55 PM  Result Value Ref Range Status   Specimen Description URINE, CLEAN CATCH  Final   Special Requests NONE  Final   Culture MULTIPLE SPECIES PRESENT, SUGGEST RECOLLECTION  Final   Report Status 12/15/2015 FINAL  Final    Radiology Reports Ct Head Wo Contrast  12/13/2015  CLINICAL DATA:  Expressive aphasia. Hx HTN, stroke, and diabetes. EXAM: CT HEAD WITHOUT CONTRAST TECHNIQUE: Contiguous axial images were obtained from the base of the skull through the vertex without intravenous contrast. COMPARISON:  03/08/2015 FINDINGS: There is significant  atrophy and small vessel disease. Chronic lacunar infarcts are identified within the left basal ganglia and left periventricular white matter. There is no intra or extra-axial fluid collection or mass lesion. The basilar cisterns and ventricles have a normal appearance. There is no CT evidence for acute infarction or hemorrhage. Bone windows demonstrate previous sinus surgery. There is atherosclerotic calcification of the internal carotid arteries. IMPRESSION: 1. Atrophy and small vessel disease. 2. Chronic ischemic changes involving the left periventricular white matter and  basal ganglia. 3.  No evidence for acute intracranial abnormality. Electronically Signed   By: Nolon Nations M.D.   On: 12/13/2015 19:05   Mr Brain Wo Contrast  12/13/2015  CLINICAL DATA:  Initial evaluation for acute encephalopathy. Possible transient aphasia. EXAM: MRI HEAD WITHOUT CONTRAST TECHNIQUE: Multiplanar, multiecho pulse sequences of the brain and surrounding structures were obtained without intravenous contrast. COMPARISON:  Prior head CT from earlier same day. FINDINGS: Study mildly degraded by motion artifact. Diffuse prominence of the CSF containing spaces compatible with generalized age-related cerebral atrophy. Patchy T2/FLAIR hyperintensity within the periventricular and deep white matter both cerebral hemispheres most consistent with chronic small vessel ischemic disease, mild for patient age. Chronic small vessel ischemic type changes present within the central pons as well. Remote lacunar infarct present within the left basal ganglia. No abnormal foci of restricted diffusion to suggest acute intracranial infarct. Gray-white matter differentiation maintained. Major intracranial vascular flow voids are preserved. No acute or chronic intracranial hemorrhage. No mass lesion, midline shift, or mass effect. No hydrocephalus. No extra-axial fluid collection. Major dural sinuses are grossly patent. Craniocervical junction within normal limits. Visualized upper cervical spine demonstrates no acute abnormality. Pituitary gland normal. No acute abnormality about the orbits. Sequela prior bilateral lens extraction noted. Paranasal sinuses are largely clear. No mastoid effusion. Inner ear structures grossly normal. Bone marrow signal intensity within normal limits. Scalp soft tissues demonstrate no acute process. IMPRESSION: 1. No acute intracranial infarct or other process identified. 2. Age-related cerebral atrophy with mild chronic small vessel ischemic disease. 3. Remote lacunar infarct within the  left basal ganglia. Electronically Signed   By: Jeannine Boga M.D.   On: 12/13/2015 22:38   Dg Chest Port 1 View  12/14/2015  CLINICAL DATA:  hyponatremia EXAM: PORTABLE CHEST - 1 VIEW COMPARISON:  07/01/2011 FINDINGS: Attenuated bronchovascular markings in the right lung as before, with some rightward mediastinal shift and distortion of the right hilum, stable. Surgical clips right axilla. No new infiltrate. No overt edema. No effusion. No pneumothorax. Heart size normal. Visualized skeletal structures are unremarkable. IMPRESSION: 1. Chronic right lung changes. No acute or superimposed abnormality. Electronically Signed   By: Lucrezia Europe M.D.   On: 12/14/2015 09:53     CBC  Recent Labs Lab 12/13/15 1820 12/14/15 0019 12/15/15 0531 12/16/15 0130  WBC 17.7* 17.6* 11.2* 19.2*  HGB 14.6 14.4 11.7* 13.8  HCT 40.5 39.6 33.8* 39.7  PLT 253 236 195 209  MCV 80.4 80.8 82.2 81.9  MCH 29.0 29.4 28.5 28.5  MCHC 36.0 36.4* 34.6 34.8  RDW 14.2 14.3 14.7 14.8  LYMPHSABS  --   --   --  1.4  MONOABS  --   --   --  1.7*  EOSABS  --   --   --  0.1  BASOSABS  --   --   --  0.0    Chemistries   Recent Labs Lab 12/14/15 0019  12/14/15 1059 12/14/15 2058 12/15/15 1003 12/15/15 1818 12/16/15 0130  NA  --   < > 120* 123* 127* 131* 129*  K  --   < > 3.3* 3.7 4.4 4.1 4.1  CL  --   < > 84* 89* 95* 98* 94*  CO2  --   < > 23 27 25 23 26   GLUCOSE  --   < > 138* 109* 112* 140* 128*  BUN  --   < > 14 17 13 19 17   CREATININE  --   < > 0.61 0.82 0.71 0.75 0.56  CALCIUM  --   < > 8.2* 7.9* 8.1* 7.8* 8.4*  MG 1.6*  --   --   --   --   --   --   AST  --   --  36 23  --   --   --   ALT  --   --  26 21  --   --   --   ALKPHOS  --   --  49 41  --   --   --   BILITOT  --   --  1.2 1.0  --   --   --   < > = values in this interval not displayed. ------------------------------------------------------------------------------------------------------------------ estimated creatinine clearance is 48.2  mL/min (by C-G formula based on Cr of 0.56). ------------------------------------------------------------------------------------------------------------------ No results for input(s): HGBA1C in the last 72 hours. ------------------------------------------------------------------------------------------------------------------  Recent Labs  12/14/15 0017  CHOL 118  HDL 33*  LDLCALC 65  TRIG 100  CHOLHDL 3.6   ------------------------------------------------------------------------------------------------------------------  Recent Labs  12/14/15 0019  TSH 2.058   ------------------------------------------------------------------------------------------------------------------ No results for input(s): VITAMINB12, FOLATE, FERRITIN, TIBC, IRON, RETICCTPCT in the last 72 hours.  Coagulation profile  Recent Labs Lab 12/13/15 1800  INR 1.05    No results for input(s): DDIMER in the last 72 hours.  Cardiac Enzymes No results for input(s): CKMB, TROPONINI, MYOGLOBIN in the last 168 hours.  Invalid input(s): CK ------------------------------------------------------------------------------------------------------------------ Invalid input(s): POCBNP   CBG:  Recent Labs Lab 12/14/15 0609 12/15/15 0618  GLUCAP 102* 82       Studies: Dg Chest Port 1 View  12/14/2015  CLINICAL DATA:  hyponatremia EXAM: PORTABLE CHEST - 1 VIEW COMPARISON:  07/01/2011 FINDINGS: Attenuated bronchovascular markings in the right lung as before, with some rightward mediastinal shift and distortion of the right hilum, stable. Surgical clips right axilla. No new infiltrate. No overt edema. No effusion. No pneumothorax. Heart size normal. Visualized skeletal structures are unremarkable. IMPRESSION: 1. Chronic right lung changes. No acute or superimposed abnormality. Electronically Signed   By: Lucrezia Europe M.D.   On: 12/14/2015 09:53      No results found for: HGBA1C Lab Results  Component Value  Date   LDLCALC 65 12/14/2015   CREATININE 0.56 12/16/2015       Scheduled Meds: . atenolol  100 mg Oral Daily  . atorvastatin  80 mg Oral QHS  . calcium carbonate  1,000 mg of elemental calcium Oral Q breakfast  . cephALEXin  500 mg Oral Q8H  . cholecalciferol  2,000 Units Oral Daily  . clopidogrel  75 mg Oral Daily  . enoxaparin (LOVENOX) injection  40 mg Subcutaneous QHS  . feeding supplement (ENSURE ENLIVE)  237 mL Oral BID BM  . multivitamin with minerals  1 tablet Oral Daily  . pantoprazole  40 mg Oral Daily  . sodium chloride flush  3 mL Intravenous Q12H   Continuous Infusions:     LOS: 3 days  Time spent: >30 MINS    [facility] Pager [phone]. If 7PM-7AM, please contact night-coverage at [email or web address], password [facility] 12/16/2015, 9:13 AM  LOS: 3 days

## 2015-12-16 NOTE — Care Management Note (Signed)
Case Management Note  Patient Details  Name: Helen [name] MRN: [id number] Date of Birth: [birth year]  Subjective/Objective:                    Action/Plan: Patient was admitted with altered mental status. Lives at home alone. Will follow for discharge needs pending PT/OT evals and physician orders.  Expected Discharge Date:                  Expected Discharge Plan:     In-House Referral:     Discharge planning Services     Post Acute Care Choice:    Choice offered to:     DME Arranged:    DME Agency:     HH Arranged:    HH Agency:     Status of Service:  In process, will continue to follow  Medicare Important Message Given:    Date Medicare IM Given:    Medicare IM give by:    Date Additional Medicare IM Given:    Additional Medicare Important Message give by:     If discussed at [place] of Stay Meetings, dates discussed:    Additional CommentsRolm Baptise, RN 12/16/2015, 10:14 AM [phone]

## 2015-12-16 NOTE — Clinical Social Work Placement (Signed)
   CLINICAL SOCIAL WORK PLACEMENT  NOTE  Date:  12/16/2015  Patient Details  Name: Helen [name] MRN: [id number] Date of Birth: [birth year]  Clinical Social Work is seeking post-discharge placement for this patient at the [place] level of care (*CSW will initial, date and re-position this form in  chart as items are completed):  Yes   Patient/family provided with [place] Work Department's list of facilities offering this level of care within the geographic area requested by the patient (or if unable, by the patient's family).  Yes   Patient/family informed of their freedom to choose among providers that offer the needed level of care, that participate in Medicare, Medicaid or managed care program needed by the patient, have an available bed and are willing to accept the patient.  Yes   Patient/family informed of [facility]'s ownership interest in [facility] and [facility], as well as of the fact that they are under no obligation to receive care at these facilities.  PASRR submitted to EDS on 12/16/15     PASRR number received on       Existing PASRR number confirmed on       FL2 transmitted to all facilities in geographic area requested by pt/family on 12/16/15     FL2 transmitted to all facilities within larger geographic area on       Patient informed that his/her managed care company has contracts with or will negotiate with certain facilities, including the following:            Patient/family informed of bed offers received.  Patient chooses bed at       Physician recommends and patient chooses bed at      Patient to be transferred to   on  .  Patient to be transferred to facility by       Patient family notified on   of transfer.  Name of family member notified:        PHYSICIAN Please sign FL2     Additional Comment:    Barbette Or, [place]

## 2015-12-16 NOTE — Progress Notes (Signed)
Occupational Therapy Treatment Patient Details Name: Helen [name] MRN: [id number] DOB: Jul 21, 1930 Today's Date: 12/16/2015    History of present illness Pt is an 82 y.o. female with medical history of hypertension, hyperlipidemia, diet-controlled diabetes, GERD, stroke, CAD, PVD, right upper stenosis, breast cancer (s/p of mastectomy), and migraine headache. Pt presented to the ED with AMS and dizziness/unsteady gait. Pt admitted with dx of acute encephalopathy.    OT comments  Pt progressing. Continue to recommend SNF for rehab. Feel pt will continue to benefit from acute OT to increase independence prior to d/c. Pt overall setup/supervision level for ADL tasks in session and Min guard for ambulation.  Follow Up Recommendations  SNF;Supervision/Assistance - 24 hour    Equipment Recommendations  Other (comment) (defer to next venue)    Recommendations for Other Services      Precautions / Restrictions Precautions Precautions: Fall Restrictions Weight Bearing Restrictions: No       Mobility Bed Mobility Overal bed mobility: Needs Assistance Bed Mobility: Supine to Sit;Sit to Supine     Supine to sit: Supervision Sit to supine: Supervision      Transfers Overall transfer level: Needs assistance   Transfers: Sit to/from Stand Sit to Stand: Supervision         General transfer comment: also set up for RW    Balance         Performed functional tasks standing at sink without LOB.                           ADL Overall ADL's : Needs assistance/impaired     Grooming: Wash/dry hands;Wash/dry face;Oral care;Applying deodorant;Set up;Supervision/safety;Standing Grooming Details (indicate cue type and reason): cues for positioning of toothpaste to put on toothbrush     Lower Body Bathing: Set up;Supervison/ safety (standing-washed bottom and peri area and dryed it off)               Toileting- Clothing Manipulation and Hygiene:  Supervision/safety (standing-pt managed gown to wash/dry peri area and bottom) Toileting - Clothing Manipulation Details (indicate cue type and reason): supervision for clothing manipulation      Functional mobility during ADLs: Min guard;Rolling walker        Vision                     Perception     Praxis      Cognition  Awake/Alert Behavior During Therapy: WFL for tasks assessed/performed Overall Cognitive Status: No family/caregiver present to determine baseline cognitive functioning Area of Impairment: Problem solving              Problem Solving: Slow processing;Requires verbal cues      Extremity/Trunk Assessment               Exercises     Shoulder Instructions       General Comments      Pertinent Vitals/ Pain       Pain Assessment: No/denies pain  Home Living                                          Prior Functioning/Environment              Frequency Min 2X/week     Progress Toward Goals  OT Goals(current goals can now be found in the care plan section)  Progress towards OT goals: Progressing toward goals-updated a goal  Acute Rehab OT Goals Patient Stated Goal: not stated OT Goal Formulation: With patient/family Time For Goal Achievement: 12/28/15 Potential to Achieve Goals: Good ADL Goals Pt Will Perform Grooming: with supervision;standing Pt Will Perform Upper Body Bathing: with supervision;sitting Pt Will Perform Lower Body Bathing: with supervision;sit to/from stand Pt Will Transfer to Toilet: with set-up;with supervision;ambulating;bedside commode;regular height toilet (using RW; regular height toilet or BSC) Pt Will Perform Toileting - Clothing Manipulation and hygiene: with supervision;sit to/from stand  Plan Discharge plan remains appropriate    Co-evaluation                 End of Session Equipment Utilized During Treatment: Rolling walker   Activity Tolerance Patient tolerated  treatment well   Patient Left in bed;with call bell/phone within reach   Nurse Communication Other (comment) (pt felt like catheter came out; called secretary to let nurse know about setting bed alarm)        Time: [id number] OT Time Calculation (min): 18 min  Charges: OT General Charges $OT Visit: 1 Procedure OT Treatments $Self Care/Home Management : 8-22 mins  Benito Mccreedy OTR/L [id number] 12/16/2015, 3:41 PM

## 2015-12-16 NOTE — Progress Notes (Signed)
Physical Therapy Treatment Patient Details Name: Helen [name] MRN: [id number] DOB: [birth year] Today's Date: 12/16/2015    History of Present Illness Pt is an 82 y.o. female with medical history of hypertension, hyperlipidemia, diet-controlled diabetes, GERD, stroke, CAD, PVD, right upper stenosis, breast cancer (s/p of mastectomy), and migraine headache. Pt presented to the ED with AMS and dizziness/unsteady gait. Pt admitted with dx of acute encephalopathy.     PT Comments    Patient limited due to orthostatic BP and wanting to stay in room as up through the night to urinate and not much rest.  Will continue to benefit from skilled PT in the acute setting to allow maximal mobility for d/c home after SNF level rehab.   Follow Up Recommendations  SNF;Supervision/Assistance - 24 hour     Equipment Recommendations  Other (comment) (TBD)    Recommendations for Other Services       Precautions / Restrictions Precautions Precautions: Fall Restrictions Weight Bearing Restrictions: No    Mobility  Bed Mobility Overal bed mobility: Needs Assistance Bed Mobility: Supine to Sit;Sit to Supine     Supine to sit: Supervision Sit to supine: Supervision   General bed mobility comments: slow but without physical help  Transfers Overall transfer level: Needs assistance Equipment used: Rolling walker (2 wheeled) Transfers: Sit to/from Stand Sit to Stand: Min guard         General transfer comment: also set up for RW  Ambulation/Gait Ambulation/Gait assistance: Min assist Ambulation Distance (Feet): 30 Feet Assistive device: Rolling walker (2 wheeled) Gait Pattern/deviations: Step-through pattern;Decreased stride length;Trunk flexed     General Gait Details: used walker in room (which was wide bariatric walker,) and assisted to door and around to chair, assist to guide walker in small space and stayed in room at pt request and due to orthostatic BP postive.   Stairs             Wheelchair Mobility    Modified Rankin (Stroke Patients Only)       Balance Overall balance assessment: Needs assistance   Sitting balance-Leahy Scale: Good     Standing balance support: Bilateral upper extremity supported Standing balance-Leahy Scale: Poor Standing balance comment: UE support for static standing for orthostatic BP                    Cognition Arousal/Alertness: Awake/alert Behavior During Therapy: WFL for tasks assessed/performed Overall Cognitive Status: No family/caregiver present to determine baseline cognitive functioning Area of Impairment: Problem solving     Memory: Decreased short-term memory Following Commands: Follows multi-step commands inconsistently     Problem Solving: Slow processing;Requires verbal cues      Exercises      General Comments General comments (skin integrity, edema, etc.): educated pt in orthostatic precautions; reports years ago oncologist educated her in the same      Pertinent Vitals/Pain Pain Assessment: No/denies pain  Orthostatic VS for the past 24 hrs:  BP- Lying Pulse- Lying BP- Sitting Pulse- Sitting BP- Standing at 0 minutes Pulse- Standing at 0 minutes  12/16/15 1209 120/62 mmHg 69 132/57 mmHg 70 111/46 mmHg 67  Standing at 3 min BP 98/74, HR 74      Home Living                      Prior Function            PT Goals (current goals can now be found in the care plan section)  Acute Rehab PT Goals Patient Stated Goal: not stated Progress towards PT goals: Progressing toward goals    Frequency  Min 3X/week    PT Plan Current plan remains appropriate    Co-evaluation             End of Session Equipment Utilized During Treatment: Gait belt Activity Tolerance: Patient tolerated treatment well Patient left: in chair;with call bell/phone within reach     Time: 1200-1225 PT Time Calculation (min) (ACUTE ONLY): 25 min  Charges:  $Gait Training: 8-22  mins $Therapeutic Activity: 8-22 mins                    G Codes:      Helen [name] Dec 29, 2015, 4:00 PM Helen [name], [place] 12-29-15

## 2015-12-16 NOTE — Clinical Social Work Note (Signed)
Clinical Social Work Assessment  Patient Details  Name: Helen [name] MRN: [id number] Date of Birth: 11/07/1929  Date of referral:  12/16/15               Reason for consult:  Facility Placement, Discharge Planning                Permission sought to share information with:  Facility Contact Representative, Family Supports Permission granted to share information::  Yes, Verbal Permission Granted  Name::     Helen [name] and Helen [name]  Agency::  SNFs  Relationship::  Daughters  Contact Information:  Helen [name] ([phone]) Helen [name] ([phone])  Housing/Transportation Living arrangements for the past 2 months:  Apartment (Retirement Community ([facility] Source of Information:  Adult Children Patient Interpreter Needed:  None Criminal Activity/Legal Involvement Pertinent to Current Situation/Hospitalization:  No - Comment as needed Significant Relationships:  Adult Children Lives with:  Self Do you feel safe going back to the place where you live?  Yes Need for family participation in patient care:  Yes (Comment)  Care giving concerns:  Patient currently lives alone in an apartment retirement community. The name of the community is [facility]. Patient daughters Helen [name] and Helen [name] state that the patient had recently been declining in her health. Patient daughters stated that patient no longer drives. Patient daughters hope to transition patient to long-term care following SNF placement.   Social Worker assessment / plan:  BSW intern met with patient daughters in patient room to complete assessment. Patient was out of the room during time of assessment. BSW intern explained the role of a medical social worker and stated that a social worker would assist with disposition needs. Patient daughters are actively involved in patient care and hope to do what is best for the patient. Patient daughters stated that the physical therapist had mentioned a SNF, so the daughters have actively  been searching for SNF placement. The patient daughters do not want the patient to go to [facility]. The daughters first choice for the patient is [facility] [place]. The daughters second choice is [facility]. Patient daughters are willing to send patient information to other SNF as alternates. Patient daughters are going to contact facilities as well. CSW will continue to follow and assist as needed.  Employment status:  Retired Insurance information:  Medicare ([employer]) PT Recommendations:  Skilled Nursing Facility, 24 Hour Supervision Information / Referral to community resources:  Skilled Nursing Facility  Patient/Family's Response to care:  Patient family appear to be satisfied with care that patient is currently receiving.   Patient/Family's Understanding of and Emotional Response to Diagnosis, Current Treatment, and Prognosis:  Patient family appear to have a good understanding of the patient's reason for admission, current treatment, and post discharge needs.  Emotional Assessment Appearance:   (Unable to assess) Attitude/Demeanor/Rapport:  Unable to Assess Affect (typically observed):  Unable to Assess Orientation:  Oriented to Self, Oriented to Place, Oriented to  Time Alcohol / Substance use:  Not Applicable Psych involvement (Current and /or in the community):  No (Comment)  Discharge Needs  Concerns to be addressed:   (Lives alone) Readmission within the last 30 days:  No Current discharge risk:  Lives alone Barriers to Discharge:  Continued Medical Work up     BSW Intern, [phone]  

## 2015-12-16 NOTE — NC FL2 (Signed)
[place] LEVEL OF CARE SCREENING TOOL     IDENTIFICATION  Patient Name: Helen [name] Birthdate: [birth year] Sex: female Admission Date (Current Location): 12/13/2015  [facility] and [place] Number:  Herbalist and Address:  [facility], [place] [address], [place], [place] [address]      Provider Number: [id number]  Attending Physician Name and Address:  Reyne Dumas, MD  Relative Name and Phone Number:       Current Level of Care: Hospital Recommended Level of Care: [place] Prior Approval Number:    Date Approved/Denied:   PASRR Number:    Discharge Plan: SNF    Current Diagnoses: Patient Active Problem List   Diagnosis Date Noted  . Essential hypertension   . Acute encephalopathy 12/13/2015  . Hyponatremia 12/13/2015  . Hypokalemia 12/13/2015  . UTI (lower urinary tract infection) 12/13/2015  . Leukocytosis 12/13/2015  . Hypercholesteremia   . Hypertension   . Diabetes mellitus without complication ([place])   . Stroke ([place])   . CAD (coronary artery disease)   . Peripheral vascular disease ([place])   . Carotid stenosis 03/13/2015    Orientation RESPIRATION BLADDER Height & Weight     Self, Time, Place  Normal Continent (indwelling catheter)  Weight: 148 lb (67.132 kg) Height:  5\' 3"  (160 cm)  BEHAVIORAL SYMPTOMS/MOOD NEUROLOGICAL BOWEL NUTRITION STATUS   (appropriate)  (n/a) Continent Diet (Regular )  AMBULATORY STATUS COMMUNICATION OF NEEDS Skin   Limited Assist Verbally Normal                       Personal Care Assistance Level of Assistance  Bathing, Feeding, Dressing Bathing Assistance: Limited assistance Feeding assistance: Independent Dressing Assistance: Limited assistance     Functional Limitations Info  Sight, Hearing, Speech Sight Info: Adequate Hearing Info: Adequate Speech Info: Adequate    SPECIAL CARE FACTORS FREQUENCY  PT (By licensed PT), OT (By licensed OT)     PT  Frequency: 5x/week OT Frequency: 5x/week            Contractures Contractures Info: Not present    Additional Factors Info  Code Status, Allergies Code Status Info: FULL CODE Allergies Info: Morphine And Related, Other, Sulfa Antibiotics, Nickel           Current Medications (12/16/2015):  This is the current hospital active medication list Current Facility-Administered Medications  Medication Dose Route Frequency Provider Last Rate Last Dose  . acetaminophen (TYLENOL) tablet 650 mg  650 mg Oral Q6H PRN Ivor Costa, MD       Or  . acetaminophen (TYLENOL) suppository 650 mg  650 mg Rectal Q6H PRN Ivor Costa, MD      . atenolol (TENORMIN) tablet 100 mg  100 mg Oral Daily Ivor Costa, MD   100 mg at 12/15/15 [id number]  . atorvastatin (LIPITOR) tablet 80 mg  80 mg Oral QHS Ivor Costa, MD   80 mg at 12/15/15 2123  . bismuth subsalicylate (PEPTO BISMOL) 262 MG/15ML suspension 30 mL  30 mL Oral Q6H PRN Ivor Costa, MD      . calcium carbonate (OS-CAL - dosed in mg of elemental calcium) tablet 1,000 mg of elemental calcium  1,000 mg of elemental calcium Oral Q breakfast Ivor Costa, MD   1,000 mg of elemental calcium at 12/16/15 0909  . cephALEXin (KEFLEX) capsule 500 mg  500 mg Oral Q8H Reyne Dumas, MD      . cholecalciferol (VITAMIN  D) tablet 2,000 Units  2,000 Units Oral Daily Ivor Costa, MD   2,000 Units at 12/15/15 [id number]  . clopidogrel (PLAVIX) tablet 75 mg  75 mg Oral Daily Ivor Costa, MD   75 mg at 12/15/15 0939  . enoxaparin (LOVENOX) injection 40 mg  40 mg Subcutaneous QHS Ivor Costa, MD   40 mg at 12/15/15 2124  . feeding supplement (ENSURE ENLIVE) (ENSURE ENLIVE) liquid 237 mL  237 mL Oral BID BM Reyne Dumas, MD   237 mL at 12/15/15 1306  . hydrALAZINE (APRESOLINE) injection 5 mg  5 mg Intravenous Q2H PRN Ivor Costa, MD   5 mg at 12/13/15 2103  . multivitamin with minerals tablet 1 tablet  1 tablet Oral Daily Ivor Costa, MD   1 tablet at 12/15/15 [id number]  . ondansetron (ZOFRAN) tablet 4 mg  4 mg Oral  Q6H PRN Ivor Costa, MD       Or  . ondansetron [facility]) injection 4 mg  4 mg Intravenous Q6H PRN Ivor Costa, MD      . pantoprazole (PROTONIX) EC tablet 40 mg  40 mg Oral Daily Ivor Costa, MD   40 mg at 12/15/15 0939  . sodium chloride flush (NS) 0.9 % injection 3 mL  3 mL Intravenous Q12H Ivor Costa, MD   3 mL at 12/15/15 [id number]     Discharge Medications: Please see discharge summary for a list of discharge medications.  Relevant Imaging Results:  Relevant Lab Results:   Additional Information SS#[id number]   [place] Intern, [id number]

## 2015-12-17 ENCOUNTER — Inpatient Hospital Stay (HOSPITAL_COMMUNITY): Payer: Medicare Other

## 2015-12-17 LAB — COMPREHENSIVE METABOLIC PANEL
ALT: 17 U/L (ref 14–54)
AST: 19 U/L (ref 15–41)
Albumin: 2.8 g/dL — ABNORMAL LOW (ref 3.5–5.0)
Alkaline Phosphatase: 39 U/L (ref 38–126)
Anion gap: 8 (ref 5–15)
BUN: 15 mg/dL (ref 6–20)
CO2: 29 mmol/L (ref 22–32)
Calcium: 8.3 mg/dL — ABNORMAL LOW (ref 8.9–10.3)
Chloride: 93 mmol/L — ABNORMAL LOW (ref 101–111)
Creatinine, Ser: 0.78 mg/dL (ref 0.44–1.00)
GFR calc Af Amer: >60 mL/min (ref >60)
GFR calc non Af Amer: >60 mL/min (ref >60)
Glucose, Bld: 122 mg/dL — ABNORMAL HIGH (ref 65–99)
Potassium: 3.7 mmol/L (ref 3.5–5.1)
Sodium: 130 mmol/L — ABNORMAL LOW (ref 135–145)
Total Bilirubin: 0.9 mg/dL (ref 0.3–1.2)
Total Protein: 5.2 g/dL — ABNORMAL LOW (ref 6.5–8.1)

## 2015-12-17 LAB — GLUCOSE, CAPILLARY: Glucose-Capillary: 128 mg/dL — ABNORMAL HIGH (ref 65–99)

## 2015-12-17 MED ORDER — CLONAZEPAM 0.5 MG PO TABS: 0.5000 mg | ORAL_TABLET | Freq: Two times a day (BID) | ORAL | Status: DC | PRN

## 2015-12-17 MED ORDER — BUSPIRONE HCL 10 MG PO TABS
5.0000 mg | ORAL_TABLET | Freq: Two times a day (BID) | ORAL | Status: DC
  Administered 2015-12-17 – 2015-12-18 (×2): 5 mg via ORAL
  Filled 2015-12-17 (×2): qty 1

## 2015-12-17 MED ORDER — CLONAZEPAM 0.5 MG PO TABS
0.2500 mg | ORAL_TABLET | Freq: Two times a day (BID) | ORAL | Status: DC | PRN
  Administered 2015-12-17: 0.25 mg via ORAL
  Filled 2015-12-17: qty 1

## 2015-12-17 NOTE — Progress Notes (Signed)
Speech Language Pathology Treatment: Dysphagia  Patient Details Name: Helen [name] MRN: [id number] DOB: 03/10/1930 Today's Date: 12/17/2015 Time: 1340-1348 SLP Time Calculation (min) (ACUTE ONLY): 8 min  Assessment / Plan / Recommendation Clinical Impression  Pt seen to assess diet tolerance. Pt consumed regular textures with mildly prolonged mastication and use of lingual/digital sweep to manage small amounts of residue. No overt signs of aspiration noted, and pt denies any subjective c/o dysphagia. Recommend to continue with current diet. No SLP f/u needed for swallowing.   HPI HPI: Helen [name] is a 82 y.o. female with medical history significant of hypertension, hyperlipidemia, diet-controlled diabetes, GERD, stroke, CAD, PVD, right upper stenosis, breast cancer (s/p of mastectomy), migraine headache, who presents with AMS.  Per patient's daughter, patient becomes confused and has poor balance. She may also have transient expressive aphasia at about 4:00 to 5:00 PM. Per her daughters, patient was walking with assistance when she almost collapsed. Daughter states that she was awake and trying to speak, but that it was unintelligible. Symptoms lasted roughly 5 minutes. There was no trauma. Patient did not hit her head or neck.She does not have unilateral weakness, tingling sensations, vision change or hearing loss. She did not have seizure activities. Patient was seen by her PCP today, and was given one gram of Rocephin intramuscularly injection due to leukocytosis. Patient had negative chest x-ray and negative urinalysis for UTI in PCPs office. She was given prescription of Levaquin, but patient has not started taking this medication yet. Patient does not have respiratory symptoms, no cough, shortness of breath, chest pain, fever or chills.   Current MRI showing no acute intracranial infarct or other process identified,  age-related cerebral atrophy with mild chronic small vessel ischemic  disease, and remote lacunar infarct within the left basal ganglia.      SLP Plan  All goals met     Recommendations  Diet recommendations: Regular;Thin liquid Liquids provided via: Cup;Straw Medication Administration: Whole meds with puree Supervision: Patient able to self feed;Intermittent supervision to cue for compensatory strategies Compensations: Slow rate;Small sips/bites;Minimize environmental distractions Postural Changes and/or Swallow Maneuvers: Seated upright 90 degrees;Upright 30-60 min after meal             Oral Care Recommendations: Oral care BID Follow up Recommendations: 24 hour supervision/assistance Plan: All goals met     GO                Paiewonsky, M.A. CCC-SLP [phone]  Paiewonsky,  12/17/2015, 1:53 PM    

## 2015-12-17 NOTE — Care Management Important Message (Signed)
Important Message  Patient Details  Name: Helen [name] MRN: [id number] Date of Birth: 22-Nov-1929   Medicare Important Message Given:  Yes    [facility] P Azrael Huss 12/17/2015, 1:32 PM

## 2015-12-17 NOTE — Progress Notes (Signed)
[facility] PROGRESS NOTE  Helen [name] [id number] DOB: 1930/02/17 DOA: 12/13/2015   PCP: [name], MD     Assessment/Plan: Principal Problem:   Acute encephalopathy Active Problems:   Carotid stenosis   Hypercholesteremia   Hypertension   Diabetes mellitus without complication ([place])   Stroke ([place])   CAD (coronary artery disease)   Peripheral vascular disease ([place])   Hyponatremia   Hypokalemia   Leukocytosis   Essential hypertension   Eilee Currier is a 82 y.o. female with medical history significant of hypertension, hyperlipidemia, diet-controlled diabetes, GERD, stroke, CAD, PVD, right upper stenosis, breast cancer (s/p of mastectomy), migraine headache, who presents with AMS.  Per patient's daughter, patient becomes confused and has poor balance. She may also have transient expressive aphasia at about 4:00 to 5:00 PM. Per her daughters, patient was walking with assistance when she almost collapsed. Daughter states that she was awake and trying to speak, but that it was unintelligible. Symptoms lasted roughly 5 minutes. . Patient was seen by her PCP, and was given one gram of Rocephin intramuscularly injection due to leukocytosis. Patient had negative chest x-ray and negative urinalysis for UTI in PCPs office. She was given prescription of Levaquin, but patient has not started taking this medication yet. ED Course: pt was found to have leukocytosis with WBC 17.7, INR 1.05, PTT 28, negative troponin, temperature normal, no tachycardia, potassium 3.2, sodium 115, creatinine normal, negative CT head for acute intracranial abnormalities. Patient's admitted to inpatient for further eval and treatment.  Assessment and plan Acute encephalopathy: Etiology is not clear, potential differential diagnosis include hyponatremia, known carotid artery stenosis and TIA/stroke. Vs secondary to urinary tract infection Continues to have intermittent confusion MRI brain no  acute stroke remote lacunar infarct  Physical therapy recommending SNF-    Hyponatremia: SIADH secondary to Zoloft, Likely due to decreased oral intake. Zoloft, ramipril and Depakote use may have also contributed.   Hyponatremia may be contributing to her acute onset of encephalopathy -Hold Zoloft, ramipril and Depakote   urine sodium, and urine and plasma osm nl , TSH 2.0 , cortisol level- 32.3 Discontinue IV fluids and monitor sodium, currently stable around 1:30 Chest x-ray negative for pneumonia   Leukocytosis/UTI-unclear etiology, chest x-ray negative, UA slightly positive, patient likely has a urinary tract infection flareup secondary to urinary retention and vesicoureteral reflux. Continue Rocephin, repeat UA negative likely secondary to partial treatment of her previous UTI, follow white count, Foley replaced and follow up with outpatient urology for urinary retention. Renal ultrasound to look for hydronephrosis/pyelo   Bilateral carotid artery stenosis: Carotid artery Doppler on 02/14/15 showed left internal carotid artery approaching the lower end of the 50-69% and interval progression of right-sided atherosclerotic plaque within the right common carotid artery, likely resulting in a hemodynamically significant stenosis within the proximal CCA. suspected progression of atherosclerotic plaque within the right carotid bulb and proximal aspect of the right internal carotid artery though not definitely resulting in a hemodynamically significant stenosis.  Pt was seen by vascular surgeon, Dr. Bridgett Larsson on 03/13/15, who recommended maximize the medical treatment. -continue plavix and lipitor -repeat carotid artery doppler -No significant (1-39%) ICA stenosis,Left ECA >50% stenosis. Elevated velocities noted at right proximal CCA   Diet controlled DM-II: Last A1c not on record. Patient is not taking med at home. CBG=129 on admission -monitoring CBG-->qAM CBG Hemoglobin A1c 6.8  HLD: Last LDL  was not on record -Continue home medications: lipitor LDL 65, triglycerides  100  HTN: -Hold ramipril due to low Na level -continue atenolol -IV hydralazine  Hx of stroke: -On plavix and lipitor  CAD: no CP. Trop negative  -continue plavix, atenolol and lipiotor  Hypokalemia: K= 3.2 on admission. - Repleted    Depression:  -will hold home medications, zoloft which may have contributed to hyponatremia, SIADH Started on low-dose Klonopin for anxiety  Migraine headaches: No headache now. -Hold Depakote due to AMS and hyponatremia. Apparently has not taken Depakote for the last 1 month    DVT prophylaxsis Lovenox  Code Status:  Full code     Family Communication: Discussed in detail with the patient/daughters, all imaging results, lab results explained to the patient   Disposition Plan:  Anticipate discharge tomorrow to SNF      Consultants: None   Procedures:  None   Antibiotics: Anti-infectives    Start     Dose/Rate Route Frequency Ordered Stop   12/16/15 1130  cefTRIAXone (ROCEPHIN) 1 g in dextrose 5 % 50 mL IVPB     1 g 100 mL/hr over 30 Minutes Intravenous Every 24 hours 12/16/15 1029     12/16/15 0915  cephALEXin (KEFLEX) capsule 500 mg  Status:  Discontinued     500 mg Oral Every 8 hours 12/16/15 0912 12/16/15 1029         HPI/Subjective:  Patient anxious and nervous this morning   Objective: Filed Vitals:   12/16/15 2152 12/17/15 0155 12/17/15 0605 12/17/15 0915  BP: 167/70 149/72 151/60 128/55  Pulse: 73 81 72 71  Temp: 98.6 F (37 C) 98.1 F (36.7 C) 97.9 F (36.6 C) 98.2 F (36.8 C)  TempSrc: Oral Oral Oral Oral  Resp: 16 16 16 20   Height:      Weight:      SpO2: 97% 97% 97% 98%    Intake/Output Summary (Last 24 hours) at 12/17/15 1233 Last data filed at 12/17/15 [id number]  Gross per 24 hour  Intake      0 ml  Output    750 ml  Net   -750 ml    Exam:  Examination:  General: Not in acute distress HEENT:  Eyes:  PERRL, EOMI, no scleral icterus.  ENT: No discharge from the ears and nose, no pharynx injection, no tonsillar enlargement.   Neck: No JVD, no bruit, no mass felt. Heme: No neck lymph node enlargement. Cardiac: S1/S2, RRR, No murmurs, No gallops or rubs. Pulm: No rales, wheezing, rhonchi or rubs. Abd: Soft, nondistended, nontender, no rebound pain, no organomegaly, BS present. GU: No hematuria Ext: No pitting leg edema bilaterally. 2+DP/PT pulse bilaterally. Musculoskeletal: No joint deformities, No joint redness or warmth, no limitation of ROM in spin. Skin: No rashes.  Neuro: Alert, not oriented to place and time. She knows her daguhters, cranial nerves II-XII grossly intact, moves all extremities normally. Muscle strength 5/5 in all extremities, sensation to light touch intact.Knee reflex 1+ bilaterally. Negative Babinski's sign. Psych: Patient is not psychotic, no suicidal or hemocidal ideation.   Data Reviewed: I have personally reviewed following labs and imaging studies  Micro Results Recent Results (from the past 240 hour(s))  Urine culture     Status: None   Collection Time: 12/13/15  8:55 PM  Result Value Ref Range Status   Specimen Description URINE, CLEAN CATCH  Final   Special Requests NONE  Final   Culture MULTIPLE SPECIES PRESENT, SUGGEST RECOLLECTION  Final   Report Status 12/15/2015 FINAL  Final    Radiology  Reports Dg Chest 2 View  12/16/2015  CLINICAL DATA:  Follow-up leukocytosis. EXAM: CHEST  2 VIEW COMPARISON:  12/14/2015 FINDINGS: Hyperinflation of the lungs compatible with COPD. Heart is normal size. Scarring in the right upper lobe. No confluent opacities or effusions. No acute bony abnormality. IMPRESSION: COPD/chronic changes.  No active disease. Electronically Signed   By: Rolm Baptise M.D.   On: 12/16/2015 10:18   Ct Head Wo Contrast  12/13/2015  CLINICAL DATA:  Expressive aphasia. Hx HTN, stroke, and diabetes. EXAM: CT HEAD WITHOUT CONTRAST  TECHNIQUE: Contiguous axial images were obtained from the base of the skull through the vertex without intravenous contrast. COMPARISON:  03/08/2015 FINDINGS: There is significant atrophy and small vessel disease. Chronic lacunar infarcts are identified within the left basal ganglia and left periventricular white matter. There is no intra or extra-axial fluid collection or mass lesion. The basilar cisterns and ventricles have a normal appearance. There is no CT evidence for acute infarction or hemorrhage. Bone windows demonstrate previous sinus surgery. There is atherosclerotic calcification of the internal carotid arteries. IMPRESSION: 1. Atrophy and small vessel disease. 2. Chronic ischemic changes involving the left periventricular white matter and basal ganglia. 3.  No evidence for acute intracranial abnormality. Electronically Signed   By: Nolon Nations M.D.   On: 12/13/2015 19:05   Mr Brain Wo Contrast  12/13/2015  CLINICAL DATA:  Initial evaluation for acute encephalopathy. Possible transient aphasia. EXAM: MRI HEAD WITHOUT CONTRAST TECHNIQUE: Multiplanar, multiecho pulse sequences of the brain and surrounding structures were obtained without intravenous contrast. COMPARISON:  Prior head CT from earlier same day. FINDINGS: Study mildly degraded by motion artifact. Diffuse prominence of the CSF containing spaces compatible with generalized age-related cerebral atrophy. Patchy T2/FLAIR hyperintensity within the periventricular and deep white matter both cerebral hemispheres most consistent with chronic small vessel ischemic disease, mild for patient age. Chronic small vessel ischemic type changes present within the central pons as well. Remote lacunar infarct present within the left basal ganglia. No abnormal foci of restricted diffusion to suggest acute intracranial infarct. Gray-white matter differentiation maintained. Major intracranial vascular flow voids are preserved. No acute or chronic  intracranial hemorrhage. No mass lesion, midline shift, or mass effect. No hydrocephalus. No extra-axial fluid collection. Major dural sinuses are grossly patent. Craniocervical junction within normal limits. Visualized upper cervical spine demonstrates no acute abnormality. Pituitary gland normal. No acute abnormality about the orbits. Sequela prior bilateral lens extraction noted. Paranasal sinuses are largely clear. No mastoid effusion. Inner ear structures grossly normal. Bone marrow signal intensity within normal limits. Scalp soft tissues demonstrate no acute process. IMPRESSION: 1. No acute intracranial infarct or other process identified. 2. Age-related cerebral atrophy with mild chronic small vessel ischemic disease. 3. Remote lacunar infarct within the left basal ganglia. Electronically Signed   By: Jeannine Boga M.D.   On: 12/13/2015 22:38   Dg Chest Port 1 View  12/14/2015  CLINICAL DATA:  hyponatremia EXAM: PORTABLE CHEST - 1 VIEW COMPARISON:  07/01/2011 FINDINGS: Attenuated bronchovascular markings in the right lung as before, with some rightward mediastinal shift and distortion of the right hilum, stable. Surgical clips right axilla. No new infiltrate. No overt edema. No effusion. No pneumothorax. Heart size normal. Visualized skeletal structures are unremarkable. IMPRESSION: 1. Chronic right lung changes. No acute or superimposed abnormality. Electronically Signed   By: Lucrezia Europe M.D.   On: 12/14/2015 09:53     CBC  Recent Labs Lab 12/13/15 1820 12/14/15 0019 12/15/15 0531 12/16/15 0130  WBC 17.7* 17.6* 11.2* 19.2*  HGB 14.6 14.4 11.7* 13.8  HCT 40.5 39.6 33.8* 39.7  PLT 253 236 195 209  MCV 80.4 80.8 82.2 81.9  MCH 29.0 29.4 28.5 28.5  MCHC 36.0 36.4* 34.6 34.8  RDW 14.2 14.3 14.7 14.8  LYMPHSABS  --   --   --  1.4  MONOABS  --   --   --  1.7*  EOSABS  --   --   --  0.1  BASOSABS  --   --   --  0.0    Chemistries   Recent Labs Lab 12/14/15 0019  12/14/15 1059  12/14/15 2058 12/15/15 1003 12/15/15 1818 12/16/15 0130 12/17/15 0428  NA  --   < > 120* 123* 127* 131* 129* 130*  K  --   < > 3.3* 3.7 4.4 4.1 4.1 3.7  CL  --   < > 84* 89* 95* 98* 94* 93*  CO2  --   < > 23 27 25 23 26 29   GLUCOSE  --   < > 138* 109* 112* 140* 128* 122*  BUN  --   < > 14 17 13 19 17 15   CREATININE  --   < > 0.61 0.82 0.71 0.75 0.56 0.78  CALCIUM  --   < > 8.2* 7.9* 8.1* 7.8* 8.4* 8.3*  MG 1.6*  --   --   --   --   --   --   --   AST  --   --  36 23  --   --   --  19  ALT  --   --  26 21  --   --   --  17  ALKPHOS  --   --  49 41  --   --   --  39  BILITOT  --   --  1.2 1.0  --   --   --  0.9  < > = values in this interval not displayed. ------------------------------------------------------------------------------------------------------------------ estimated creatinine clearance is 48.2 mL/min (by C-G formula based on Cr of 0.78). ------------------------------------------------------------------------------------------------------------------ No results for input(s): HGBA1C in the last 72 hours. ------------------------------------------------------------------------------------------------------------------ No results for input(s): CHOL, HDL, LDLCALC, TRIG, CHOLHDL, LDLDIRECT in the last 72 hours. ------------------------------------------------------------------------------------------------------------------ No results for input(s): TSH, T4TOTAL, T3FREE, THYROIDAB in the last 72 hours.  Invalid input(s): FREET3 ------------------------------------------------------------------------------------------------------------------ No results for input(s): VITAMINB12, FOLATE, FERRITIN, TIBC, IRON, RETICCTPCT in the last 72 hours.  Coagulation profile  Recent Labs Lab 12/13/15 1800  INR 1.05    No results for input(s): DDIMER in the last 72 hours.  Cardiac Enzymes No results for input(s): CKMB, TROPONINI, MYOGLOBIN in the last 168 hours.  Invalid input(s):  CK ------------------------------------------------------------------------------------------------------------------ Invalid input(s): POCBNP   CBG:  Recent Labs Lab 12/14/15 0609 12/15/15 0618 12/16/15 2158 12/17/15 0618  GLUCAP 102* 82 115* 128*       Studies: Dg Chest 2 View  12/16/2015  CLINICAL DATA:  Follow-up leukocytosis. EXAM: CHEST  2 VIEW COMPARISON:  12/14/2015 FINDINGS: Hyperinflation of the lungs compatible with COPD. Heart is normal size. Scarring in the right upper lobe. No confluent opacities or effusions. No acute bony abnormality. IMPRESSION: COPD/chronic changes.  No active disease. Electronically Signed   By: Rolm Baptise M.D.   On: 12/16/2015 10:18      Lab Results  Component Value Date   HGBA1C 6.8* 12/14/2015   Lab Results  Component Value Date   LDLCALC 65 12/14/2015   CREATININE 0.78  12/17/2015       Scheduled Meds: . atenolol  100 mg Oral Daily  . atorvastatin  80 mg Oral QHS  . calcium carbonate  1,000 mg of elemental calcium Oral Q breakfast  . cefTRIAXone (ROCEPHIN)  IV  1 g Intravenous Q24H  . cholecalciferol  2,000 Units Oral Daily  . clopidogrel  75 mg Oral Daily  . enoxaparin (LOVENOX) injection  40 mg Subcutaneous QHS  . feeding supplement (ENSURE ENLIVE)  237 mL Oral BID BM  . multivitamin with minerals  1 tablet Oral Daily  . pantoprazole  40 mg Oral Daily  . sodium chloride flush  3 mL Intravenous Q12H   Continuous Infusions:     LOS: 4 days    Time spent: >30 MINS    [facility]  [facility] Pager [phone]. If 7PM-7AM, please contact night-coverage at [email or web address], password [facility] 12/17/2015, 12:33 PM  LOS: 4 days

## 2015-12-18 DIAGNOSIS — I6523 Occlusion and stenosis of bilateral carotid arteries: Secondary | ICD-10-CM | POA: Diagnosis not present

## 2015-12-18 DIAGNOSIS — R262 Difficulty in walking, not elsewhere classified: Secondary | ICD-10-CM | POA: Diagnosis not present

## 2015-12-18 DIAGNOSIS — J302 Other seasonal allergic rhinitis: Secondary | ICD-10-CM | POA: Diagnosis not present

## 2015-12-18 DIAGNOSIS — I10 Essential (primary) hypertension: Secondary | ICD-10-CM | POA: Diagnosis not present

## 2015-12-18 DIAGNOSIS — I951 Orthostatic hypotension: Secondary | ICD-10-CM | POA: Diagnosis not present

## 2015-12-18 DIAGNOSIS — G3184 Mild cognitive impairment, so stated: Secondary | ICD-10-CM | POA: Diagnosis not present

## 2015-12-18 DIAGNOSIS — K219 Gastro-esophageal reflux disease without esophagitis: Secondary | ICD-10-CM | POA: Diagnosis not present

## 2015-12-18 DIAGNOSIS — I639 Cerebral infarction, unspecified: Secondary | ICD-10-CM | POA: Diagnosis not present

## 2015-12-18 DIAGNOSIS — I2510 Atherosclerotic heart disease of native coronary artery without angina pectoris: Secondary | ICD-10-CM | POA: Diagnosis not present

## 2015-12-18 DIAGNOSIS — R2681 Unsteadiness on feet: Secondary | ICD-10-CM | POA: Diagnosis not present

## 2015-12-18 DIAGNOSIS — E119 Type 2 diabetes mellitus without complications: Secondary | ICD-10-CM | POA: Diagnosis not present

## 2015-12-18 DIAGNOSIS — R110 Nausea: Secondary | ICD-10-CM | POA: Diagnosis not present

## 2015-12-18 DIAGNOSIS — R338 Other retention of urine: Secondary | ICD-10-CM | POA: Diagnosis not present

## 2015-12-18 DIAGNOSIS — F339 Major depressive disorder, recurrent, unspecified: Secondary | ICD-10-CM | POA: Diagnosis not present

## 2015-12-18 DIAGNOSIS — G9340 Encephalopathy, unspecified: Secondary | ICD-10-CM | POA: Diagnosis not present

## 2015-12-18 DIAGNOSIS — I739 Peripheral vascular disease, unspecified: Secondary | ICD-10-CM | POA: Diagnosis not present

## 2015-12-18 DIAGNOSIS — R5381 Other malaise: Secondary | ICD-10-CM | POA: Diagnosis not present

## 2015-12-18 DIAGNOSIS — N281 Cyst of kidney, acquired: Secondary | ICD-10-CM | POA: Diagnosis not present

## 2015-12-18 DIAGNOSIS — N390 Urinary tract infection, site not specified: Secondary | ICD-10-CM | POA: Diagnosis not present

## 2015-12-18 DIAGNOSIS — M6281 Muscle weakness (generalized): Secondary | ICD-10-CM | POA: Diagnosis not present

## 2015-12-18 DIAGNOSIS — G4700 Insomnia, unspecified: Secondary | ICD-10-CM | POA: Diagnosis not present

## 2015-12-18 DIAGNOSIS — R42 Dizziness and giddiness: Secondary | ICD-10-CM | POA: Diagnosis not present

## 2015-12-18 DIAGNOSIS — H81393 Other peripheral vertigo, bilateral: Secondary | ICD-10-CM | POA: Diagnosis not present

## 2015-12-18 DIAGNOSIS — J3089 Other allergic rhinitis: Secondary | ICD-10-CM | POA: Diagnosis not present

## 2015-12-18 DIAGNOSIS — R339 Retention of urine, unspecified: Secondary | ICD-10-CM | POA: Diagnosis not present

## 2015-12-18 DIAGNOSIS — F329 Major depressive disorder, single episode, unspecified: Secondary | ICD-10-CM | POA: Diagnosis not present

## 2015-12-18 DIAGNOSIS — E785 Hyperlipidemia, unspecified: Secondary | ICD-10-CM | POA: Diagnosis not present

## 2015-12-18 DIAGNOSIS — H81399 Other peripheral vertigo, unspecified ear: Secondary | ICD-10-CM | POA: Diagnosis not present

## 2015-12-18 DIAGNOSIS — F419 Anxiety disorder, unspecified: Secondary | ICD-10-CM | POA: Diagnosis not present

## 2015-12-18 DIAGNOSIS — I9589 Other hypotension: Secondary | ICD-10-CM | POA: Diagnosis not present

## 2015-12-18 LAB — BASIC METABOLIC PANEL
Anion gap: 11 (ref 5–15)
BUN: 14 mg/dL (ref 6–20)
CO2: 29 mmol/L (ref 22–32)
Calcium: 8.9 mg/dL (ref 8.9–10.3)
Chloride: 94 mmol/L — ABNORMAL LOW (ref 101–111)
Creatinine, Ser: 0.75 mg/dL (ref 0.44–1.00)
GFR calc Af Amer: >60 mL/min (ref >60)
GFR calc non Af Amer: >60 mL/min (ref >60)
Glucose, Bld: 117 mg/dL — ABNORMAL HIGH (ref 65–99)
Potassium: 4.9 mmol/L (ref 3.5–5.1)
Sodium: 134 mmol/L — ABNORMAL LOW (ref 135–145)

## 2015-12-18 LAB — CBC
HCT: 36.7 % (ref 36.0–46.0)
Hemoglobin: 12.4 g/dL (ref 12.0–15.0)
MCH: 28.4 pg (ref 26.0–34.0)
MCHC: 33.8 g/dL (ref 30.0–36.0)
MCV: 84 fL (ref 78.0–100.0)
Platelets: 227 10*3/uL (ref 150–400)
RBC: 4.37 MIL/uL (ref 3.87–5.11)
RDW: 15.2 % (ref 11.5–15.5)
WBC: 11.7 10*3/uL — ABNORMAL HIGH (ref 4.0–10.5)

## 2015-12-18 MED ORDER — LEVOFLOXACIN 500 MG PO TABS: 500.0000 mg | ORAL_TABLET | Freq: Every day | ORAL | Status: DC

## 2015-12-18 MED ORDER — BUSPIRONE HCL 5 MG PO TABS: 5.0000 mg | ORAL_TABLET | Freq: Two times a day (BID) | ORAL | Status: DC

## 2015-12-18 NOTE — Progress Notes (Signed)
Please disregard the previous dc order. Charted in error

## 2015-12-18 NOTE — Progress Notes (Signed)
Patient d/c home. D/c instructions given and patient verbalized understanding. 

## 2015-12-18 NOTE — Clinical Documentation Improvement (Signed)
Internal Medicine  Please clarify the likely etiology of the patient's Acute Encephalopathy and document findings in next progress note. Thank you!   Acute Metabolic Encephalopathy - patient with Hyponatremia (115 on admission) and UTI  Acute Toxic Encephalopathy - secondary to Zoloft, Ramipril and Depakote; medications held  Other Type of Encephalopathy  Clinically Undetermined  Supporting Information:  Please exercise your independent, professional judgment when responding. A specific answer is not anticipated or expected.  Thank You, Zoila Shutter RN, BSN, [place] [phone]; Cell: [phone]

## 2015-12-18 NOTE — Progress Notes (Signed)
Patient is being d/c to a nursing facility. Report called to the receiving nurse Erline Levine. Patient awaiting PTAR.

## 2015-12-18 NOTE — Discharge Summary (Signed)
Physician Discharge Summary  Helen [name] MRN: [id number] DOB/AGE: 02-05-1930 82 y.o.  PCP: [name], MD   Admit date: 12/13/2015 Discharge date: 12/18/2015  Discharge Diagnoses:   Principal Problem:   Acute encephalopathy Active Problems:   Carotid stenosis   Hypercholesteremia   Hypertension   Diabetes mellitus without complication (HCC)   Stroke (HCC)   CAD (coronary artery disease)   Peripheral vascular disease (HCC)   Hyponatremia   Hypokalemia   Leukocytosis   Essential hypertension Acute metabolic encephalopathy secondary to hyponatremia   Follow-up recommendations Follow-up with PCP in 3-5 days , including all  additional recommended appointments as below Follow-up CBC, CMP in 3-5 days Patient needs to follow-up with urology outpatient for urinary retention Patient needs to follow-up with vascular surgery for abnormal carotid ultrasound-Left ECA >50% stenosis. Elevated velocities noted at right proximal CCA      Current Discharge Medication List    START taking these medications   Details  busPIRone (BUSPAR) 5 MG tablet Take 1 tablet (5 mg total) by mouth 2 (two) times daily. Qty: 60 tablet, Refills: 2      CONTINUE these medications which have CHANGED   Details  levofloxacin (LEVAQUIN) 500 MG tablet Take 1 tablet (500 mg total) by mouth daily. Qty: 5 tablet, Refills: 0      CONTINUE these medications which have NOT CHANGED   Details  atenolol (TENORMIN) 50 MG tablet Take 100 mg by mouth daily.     atorvastatin (LIPITOR) 80 MG tablet Take 80 mg by mouth at bedtime.     bismuth subsalicylate (PEPTO BISMOL) 262 MG/15ML suspension Take 30 mLs by mouth every 6 (six) hours as needed for indigestion.    CALCIUM PO Take 900 mg by mouth daily.     Cholecalciferol (VITAMIN D) 2000 UNITS CAPS Take 2,000 Units by mouth daily.     clopidogrel (PLAVIX) 75 MG tablet Take 75 mg by mouth daily.    Multiple Vitamin (MULTIVITAMIN WITH MINERALS) TABS  tablet Take 1 tablet by mouth daily.     omeprazole (PRILOSEC) 20 MG capsule Take 20 mg by mouth daily as needed (for heartburn).     ondansetron (ZOFRAN) 4 MG tablet Take 4 mg by mouth every 8 (eight) hours as needed for nausea, vomiting or refractory nausea / vomiting.     ramipril (ALTACE) 10 MG capsule Take 20 mg by mouth daily.       STOP taking these medications     cefTRIAXone (ROCEPHIN) 1 g injection      divalproex (DEPAKOTE) 125 MG DR tablet      sertraline (ZOLOFT) 25 MG tablet      divalproex (DEPAKOTE) 125 MG DR tablet          Discharge Condition: Stable  Discharge Instructions Get Medicines reviewed and adjusted: Please take all your medications with you for your next visit with your Primary MD  Please request your Primary MD to go over all hospital tests and procedure/radiological results at the follow up, please ask your Primary MD to get all Hospital records sent to his/her office.  If you experience worsening of your admission symptoms, develop shortness of breath, life threatening emergency, suicidal or homicidal thoughts you must seek medical attention immediately by calling 911 or calling your MD immediately if symptoms less severe.  You must read complete instructions/literature along with all the possible adverse reactions/side effects for all the Medicines you take and that have been prescribed to you. Take any new Medicines after  you have completely understood and accpet all the possible adverse reactions/side effects.   Do not drive when taking Pain medications.   Do not take more than prescribed Pain, Sleep and Anxiety Medications  Special Instructions: If you have smoked or chewed Tobacco in the last 2 yrs please stop smoking, stop any regular Alcohol and or any Recreational drug use.  Wear Seat belts while driving.  Please note  You were cared for by a hospitalist during your hospital stay. Once you are discharged, your primary care  physician will handle any further medical issues. Please note that NO REFILLS for any discharge medications will be authorized once you are discharged, as it is imperative that you return to your primary care physician (or establish a relationship with a primary care physician if you do not have one) for your aftercare needs so that they can reassess your need for medications and monitor your lab values.  Discharge Instructions    Diet - low sodium heart healthy    Complete by:  As directed      Increase activity slowly    Complete by:  As directed             Allergies  Allergen Reactions  . Morphine And Related Nausea And Vomiting  . Other Other (See Comments)    Barley & Carrots: learned through allergy testing. Unknown reaction  . Sulfa Antibiotics Hives  . Nickel Rash      Disposition: 01-Home or Self Care   Consults:  None     Significant Diagnostic Studies:  Dg Chest 2 View  12/16/2015  CLINICAL DATA:  Follow-up leukocytosis. EXAM: CHEST  2 VIEW COMPARISON:  12/14/2015 FINDINGS: Hyperinflation of the lungs compatible with COPD. Heart is normal size. Scarring in the right upper lobe. No confluent opacities or effusions. No acute bony abnormality. IMPRESSION: COPD/chronic changes.  No active disease. Electronically Signed   By: Rolm Baptise M.D.   On: 12/16/2015 10:18   Ct Head Wo Contrast  12/13/2015  CLINICAL DATA:  Expressive aphasia. Hx HTN, stroke, and diabetes. EXAM: CT HEAD WITHOUT CONTRAST TECHNIQUE: Contiguous axial images were obtained from the base of the skull through the vertex without intravenous contrast. COMPARISON:  03/08/2015 FINDINGS: There is significant atrophy and small vessel disease. Chronic lacunar infarcts are identified within the left basal ganglia and left periventricular white matter. There is no intra or extra-axial fluid collection or mass lesion. The basilar cisterns and ventricles have a normal appearance. There is no CT evidence for acute  infarction or hemorrhage. Bone windows demonstrate previous sinus surgery. There is atherosclerotic calcification of the internal carotid arteries. IMPRESSION: 1. Atrophy and small vessel disease. 2. Chronic ischemic changes involving the left periventricular white matter and basal ganglia. 3.  No evidence for acute intracranial abnormality. Electronically Signed   By: Nolon Nations M.D.   On: 12/13/2015 19:05   Mr Brain Wo Contrast  12/13/2015  CLINICAL DATA:  Initial evaluation for acute encephalopathy. Possible transient aphasia. EXAM: MRI HEAD WITHOUT CONTRAST TECHNIQUE: Multiplanar, multiecho pulse sequences of the brain and surrounding structures were obtained without intravenous contrast. COMPARISON:  Prior head CT from earlier same day. FINDINGS: Study mildly degraded by motion artifact. Diffuse prominence of the CSF containing spaces compatible with generalized age-related cerebral atrophy. Patchy T2/FLAIR hyperintensity within the periventricular and deep white matter both cerebral hemispheres most consistent with chronic small vessel ischemic disease, mild for patient age. Chronic small vessel ischemic type changes present within the central pons  as well. Remote lacunar infarct present within the left basal ganglia. No abnormal foci of restricted diffusion to suggest acute intracranial infarct. Gray-white matter differentiation maintained. Major intracranial vascular flow voids are preserved. No acute or chronic intracranial hemorrhage. No mass lesion, midline shift, or mass effect. No hydrocephalus. No extra-axial fluid collection. Major dural sinuses are grossly patent. Craniocervical junction within normal limits. Visualized upper cervical spine demonstrates no acute abnormality. Pituitary gland normal. No acute abnormality about the orbits. Sequela prior bilateral lens extraction noted. Paranasal sinuses are largely clear. No mastoid effusion. Inner ear structures grossly normal. Bone marrow  signal intensity within normal limits. Scalp soft tissues demonstrate no acute process. IMPRESSION: 1. No acute intracranial infarct or other process identified. 2. Age-related cerebral atrophy with mild chronic small vessel ischemic disease. 3. Remote lacunar infarct within the left basal ganglia. Electronically Signed   By: Jeannine Boga M.D.   On: 12/13/2015 22:38   US Renal  12/17/2015  CLINICAL DATA:  Patient with history of urinary retention. EXAM: RENAL / URINARY TRACT ULTRASOUND COMPLETE COMPARISON:  None. FINDINGS: Right Kidney: Length: 10.6 cm. Normal renal cortical thickness and echogenicity. Too small to characterize hypoechoic lesion inferior pole right kidney. No hydronephrosis. Left Kidney: Length: 10.3 cm. Normal renal cortical thickness and echogenicity. No hydronephrosis. Bladder: Decompressed with Foley catheter. IMPRESSION: No hydronephrosis. Electronically Signed   By: Lovey Newcomer M.D.   On: 12/17/2015 20:46   Dg Chest Port 1 View  12/14/2015  CLINICAL DATA:  hyponatremia EXAM: PORTABLE CHEST - 1 VIEW COMPARISON:  07/01/2011 FINDINGS: Attenuated bronchovascular markings in the right lung as before, with some rightward mediastinal shift and distortion of the right hilum, stable. Surgical clips right axilla. No new infiltrate. No overt edema. No effusion. No pneumothorax. Heart size normal. Visualized skeletal structures are unremarkable. IMPRESSION: 1. Chronic right lung changes. No acute or superimposed abnormality. Electronically Signed   By: Lucrezia Europe M.D.   On: 12/14/2015 09:53      Filed Weights   12/14/15 1153  Weight: 67.132 kg (148 lb)     Microbiology: Recent Results (from the past 240 hour(s))  Urine culture     Status: None   Collection Time: 12/13/15  8:55 PM  Result Value Ref Range Status   Specimen Description URINE, CLEAN CATCH  Final   Special Requests NONE  Final   Culture MULTIPLE SPECIES PRESENT, SUGGEST RECOLLECTION  Final   Report Status  12/15/2015 FINAL  Final       Blood Culture    Component Value Date/Time   SDES URINE, CLEAN CATCH 12/13/2015 2055   Odin NONE 12/13/2015 2055   CULT MULTIPLE SPECIES PRESENT, SUGGEST RECOLLECTION 12/13/2015 2055   REPTSTATUS 12/15/2015 FINAL 12/13/2015 2055      Labs: Results for orders placed or performed during the hospital encounter of 12/13/15 (from the past 48 hour(s))  Glucose, capillary     Status: Abnormal   Collection Time: 12/16/15  9:58 PM  Result Value Ref Range   Glucose-Capillary 115 (H) 65 - 99 mg/dL   Comment 1 Notify RN    Comment 2 Document in Chart   Comprehensive metabolic panel     Status: Abnormal   Collection Time: 12/17/15  4:28 AM  Result Value Ref Range   Sodium 130 (L) 135 - 145 mmol/L   Potassium 3.7 3.5 - 5.1 mmol/L   Chloride 93 (L) 101 - 111 mmol/L   CO2 29 22 - 32 mmol/L   Glucose, Bld 122 (H)  65 - 99 mg/dL   BUN 15 6 - 20 mg/dL   Creatinine, Ser 0.78 0.44 - 1.00 mg/dL   Calcium 8.3 (L) 8.9 - 10.3 mg/dL   Total Protein 5.2 (L) 6.5 - 8.1 g/dL   Albumin 2.8 (L) 3.5 - 5.0 g/dL   AST 19 15 - 41 U/L   ALT 17 14 - 54 U/L   Alkaline Phosphatase 39 38 - 126 U/L   Total Bilirubin 0.9 0.3 - 1.2 mg/dL   GFR calc non Af Amer >60 >60 mL/min   GFR calc Af Amer >60 >60 mL/min    Comment: (NOTE) The eGFR has been calculated using the CKD EPI equation. This calculation has not been validated in all clinical situations. eGFR's persistently <60 mL/min signify possible Chronic Kidney Disease.    Anion gap 8 5 - 15  Glucose, capillary     Status: Abnormal   Collection Time: 12/17/15  6:18 AM  Result Value Ref Range   Glucose-Capillary 128 (H) 65 - 99 mg/dL   Comment 1 Notify RN    Comment 2 Document in Chart   CBC     Status: Abnormal   Collection Time: 12/18/15  6:05 AM  Result Value Ref Range   WBC 11.7 (H) 4.0 - 10.5 K/uL   RBC 4.37 3.87 - 5.11 MIL/uL   Hemoglobin 12.4 12.0 - 15.0 g/dL   HCT 36.7 36.0 - 46.0 %   MCV 84.0 78.0 -  100.0 fL   MCH 28.4 26.0 - 34.0 pg   MCHC 33.8 30.0 - 36.0 g/dL   RDW 15.2 11.5 - 15.5 %   Platelets 227 150 - 400 K/uL  Basic metabolic panel     Status: Abnormal   Collection Time: 12/18/15  6:05 AM  Result Value Ref Range   Sodium 134 (L) 135 - 145 mmol/L   Potassium 4.9 3.5 - 5.1 mmol/L    Comment: DELTA CHECK NOTED NO VISIBLE HEMOLYSIS    Chloride 94 (L) 101 - 111 mmol/L   CO2 29 22 - 32 mmol/L   Glucose, Bld 117 (H) 65 - 99 mg/dL   BUN 14 6 - 20 mg/dL   Creatinine, Ser 0.75 0.44 - 1.00 mg/dL   Calcium 8.9 8.9 - 10.3 mg/dL   GFR calc non Af Amer >60 >60 mL/min   GFR calc Af Amer >60 >60 mL/min    Comment: (NOTE) The eGFR has been calculated using the CKD EPI equation. This calculation has not been validated in all clinical situations. eGFR's persistently <60 mL/min signify possible Chronic Kidney Disease.    Anion gap 11 5 - 15     Lipid Panel     Component Value Date/Time   CHOL 118 12/14/2015 0017   TRIG 100 12/14/2015 0017   HDL 33* 12/14/2015 0017   CHOLHDL 3.6 12/14/2015 0017   VLDL 20 12/14/2015 0017   LDLCALC 65 12/14/2015 0017     Lab Results  Component Value Date   HGBA1C 6.8* 12/14/2015     Lab Results  Component Value Date   LDLCALC 65 12/14/2015   CREATININE 0.75 12/18/2015     Kyliah Deanda is a 82 y.o. female with medical history significant of hypertension, hyperlipidemia, diet-controlled diabetes, GERD, stroke, CAD, PVD, right upper stenosis, breast cancer (s/p of mastectomy), migraine headache, who presents with AMS.  Per patient's daughter, patient becomes confused and has poor balance. She may also have transient expressive aphasia at about 4:00 to 5:00 PM. Per her  daughters, patient was walking with assistance when she almost collapsed. Daughter states that she was awake and trying to speak, but that it was unintelligible. Symptoms lasted roughly 5 minutes. . Patient was seen by her PCP, and was given one gram of Rocephin  intramuscularly injection due to leukocytosis. Patient had negative chest x-ray and negative urinalysis for UTI in PCPs office. She was given prescription of Levaquin, but patient has not started taking this medication yet. ED Course: pt was found to have leukocytosis with WBC 17.7, INR 1.05, PTT 28, negative troponin, temperature normal, no tachycardia, potassium 3.2, sodium 115, creatinine normal, negative CT head for acute intracranial abnormalities. Patient's admitted to inpatient for further eval and treatment.  Assessment and plan Acute toxic metabolic encephalopathy: Due to hyponatremia,/UTI, known carotid artery stenosis and TIA/stroke. 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 Zoloft, ramipril and Depakote use may have also contributed. Hyponatremia may be contributing to her acute onset of encephalopathy -Hold Zoloft,  and Depakote  urine sodium, and urine and plasma osm nl , TSH 2.0 , cortisol level- 32.3 Discontinued IV fluids , sodium remained stable off IV fluids 2 days Chest x-ray negative for pneumonia   Leukocytosis/UTI-unclear etiology, chest x-ray negative, UA slightly positive, patient likely has a urinary tract infection flareup secondary to urinary retention and vesicoureteral reflux. Treated with Rocephin 2 days, repeat UA negative likely secondary to partial treatment of her previous UTI, follow white count, Foley replaced and follow up with outpatient urology for urinary retention. Renal ultrasound to look for hydronephrosis/pyelo, negative, will continue with Levaquin 500 milligrams a day 5 more days   Bilateral carotid artery stenosis: Carotid artery Doppler on 02/14/15 showed left internal carotid artery  approaching the lower end of the 50-69% and interval progression of right-sided atherosclerotic plaque within the right common carotid artery, likely resulting in a hemodynamically significant stenosis within the proximal CCA. suspected progression of atherosclerotic plaque within the right carotid bulb and proximal aspect of the right internal carotid artery though not definitely resulting in a hemodynamically significant stenosis.  Pt was seen by vascular surgeon, Dr. Bridgett Larsson on 03/13/15, who recommended maximize the medical treatment. -continue plavix and lipitor -repeat carotid artery doppler -No significant (1-39%) ICA stenosis,Left ECA >50% stenosis. Elevated velocities noted at right proximal CCA   Diet controlled DM-II: Last A1c not on record. Patient is not taking med at home. CBG=129 on admission -monitoring CBG-->qAM CBG Hemoglobin A1c 6.8  HLD: Last LDL was not on record -Continue home medications: lipitor LDL 65, triglycerides 100  HTN: -Hold ramipril due to low Na level -continue atenolol -IV hydralazine  Hx of stroke: -On plavix and lipitor  CAD: no CP. Trop negative  -continue plavix, atenolol and lipiotor  Hypokalemia: K= 3.2 on admission. - Repleted   Depression:  -will hold home medications, zoloft which may have contributed to hyponatremia, SIADH  Started on BuSpar by PCP  Migraine headaches: No headache now. -Hold Depakote due to AMS and hyponatremia. Apparently has not taken Depakote for the last 1 month      Discharge Exam:   Blood pressure 142/60, pulse 67, temperature 98.8 F (37.1 C), temperature source Oral, resp. rate 18, height 5' 3"  (1.6 m), weight 67.132 kg (148 lb), SpO2 95 %. General: Not  in acute distress HEENT:  Eyes: PERRL, EOMI, no scleral icterus.  ENT: No discharge from the ears and nose, no pharynx injection, no tonsillar enlargement.   Neck: No JVD, no bruit, no mass felt. Heme: No neck lymph node  enlargement. Cardiac: S1/S2, RRR, No murmurs, No gallops or rubs. Pulm: No rales, wheezing, rhonchi or rubs. Abd: Soft, nondistended, nontender, no rebound pain, no organomegaly, BS present. GU: No hematuria Ext: No pitting leg edema bilaterally. 2+DP/PT pulse bilaterally. Musculoskeletal: No joint deformities, No joint redness or warmth, no limitation of ROM in spin. Skin: No rashes.      Follow-up Information    Follow up with SIGMUND I Gaynelle Arabian, MD. Schedule an appointment as soon as possible for a visit in 1 week.   Specialty:  Urology   Why:  urinary retention   Contact information:   [place] [address] [phone]       Follow up with [name], MD. Schedule an appointment as soon as possible for a visit in 3 days.   Specialty:  Family Medicine   Contact information:   [place] [address] [phone]       Signed: Reyne Dumas 12/18/2015, 11:15 AM        Time spent >45 mins

## 2015-12-18 NOTE — Care Management Note (Signed)
Case Management Note  Patient Details  Name: Helen [name] MRN: [id number] Date of Birth: June 03, 1930  Subjective/Objective:                    Action/Plan: Patient discharging to [facility] today. No further needs per CM.   Expected Discharge Date:                  Expected Discharge Plan:  [place]  In-House Referral:     Discharge planning Services     Post Acute Care Choice:    Choice offered to:     DME Arranged:    DME Agency:     HH Arranged:    [place] Agency:     Status of Service:  Completed, signed off  Medicare Important Message Given:  Yes Date Medicare IM Given:    Medicare IM give by:    Date Additional Medicare IM Given:    Additional Medicare Important Message give by:     If discussed at [place] of Stay Meetings, dates discussed:    Additional Comments:  Pollie Friar, RN 12/18/2015, 2:15 PM

## 2015-12-18 NOTE — Clinical Social Work Placement (Signed)
   CLINICAL SOCIAL WORK PLACEMENT  NOTE 12/18/15 - DISCHARGED TO [place]   Date:  12/18/2015  Patient Details  Name: Helen [name] MRN: [id number] Date of Birth: November 13, 1929  Clinical Social Work is seeking post-discharge placement for this patient at the [place] level of care (*CSW will initial, date and re-position this form in  chart as items are completed):  Yes   Patient/family provided with [facility] Work Department's list of facilities offering this level of care within the geographic area requested by the patient (or if unable, by the patient's family).  Yes   Patient/family informed of their freedom to choose among providers that offer the needed level of care, that participate in Medicare, Medicaid or managed care program needed by the patient, have an available bed and are willing to accept the patient.  Yes   Patient/family informed of [facility]'s ownership interest in [facility] and [facility], as well as of the fact that they are under no obligation to receive care at these facilities.  PASRR submitted to EDS on 12/18/15     PASRR number received on  12/18/15   Existing PASRR number confirmed on       FL2 transmitted to all facilities in geographic area requested by pt/family on 12/16/15     FL2 transmitted to all facilities within larger geographic area on       Patient informed that his/her managed care company has contracts with or will negotiate with certain facilities, including the following:         12/17/15 - Patient/family informed of bed offers received.  Patient/family chooses bed at  [facility]     Physician recommends and patient chooses bed at      Patient to be transferred to  [facility] on  12/18/15.  Patient to be transferred to facility by  ambulance     Patient family notified on  12/18/15 of transfer.  Name of family member notified:   Daughters Deborha Payment and Gwenyth Ober by  phone.      PHYSICIAN Please sign FL2     Additional Comment:    _______________________________________________ Sable Feil, LCSW 12/18/2015, 1:04 PM

## 2015-12-19 DIAGNOSIS — M6281 Muscle weakness (generalized): Secondary | ICD-10-CM | POA: Diagnosis not present

## 2015-12-19 DIAGNOSIS — G3184 Mild cognitive impairment, so stated: Secondary | ICD-10-CM | POA: Diagnosis not present

## 2015-12-19 DIAGNOSIS — R5381 Other malaise: Secondary | ICD-10-CM | POA: Diagnosis not present

## 2015-12-19 DIAGNOSIS — R262 Difficulty in walking, not elsewhere classified: Secondary | ICD-10-CM | POA: Diagnosis not present

## 2015-12-26 DIAGNOSIS — R5381 Other malaise: Secondary | ICD-10-CM | POA: Diagnosis not present

## 2015-12-26 DIAGNOSIS — R262 Difficulty in walking, not elsewhere classified: Secondary | ICD-10-CM | POA: Diagnosis not present

## 2015-12-26 DIAGNOSIS — M6281 Muscle weakness (generalized): Secondary | ICD-10-CM | POA: Diagnosis not present

## 2015-12-26 DIAGNOSIS — G3184 Mild cognitive impairment, so stated: Secondary | ICD-10-CM | POA: Diagnosis not present

## 2015-12-27 DIAGNOSIS — N281 Cyst of kidney, acquired: Secondary | ICD-10-CM | POA: Diagnosis not present

## 2015-12-27 DIAGNOSIS — R110 Nausea: Secondary | ICD-10-CM | POA: Diagnosis not present

## 2015-12-27 DIAGNOSIS — R338 Other retention of urine: Secondary | ICD-10-CM | POA: Diagnosis not present

## 2015-12-27 DIAGNOSIS — H81393 Other peripheral vertigo, bilateral: Secondary | ICD-10-CM | POA: Diagnosis not present

## 2015-12-27 DIAGNOSIS — J302 Other seasonal allergic rhinitis: Secondary | ICD-10-CM | POA: Diagnosis not present

## 2015-12-31 DIAGNOSIS — I951 Orthostatic hypotension: Secondary | ICD-10-CM | POA: Diagnosis not present

## 2015-12-31 DIAGNOSIS — M6281 Muscle weakness (generalized): Secondary | ICD-10-CM | POA: Diagnosis not present

## 2015-12-31 DIAGNOSIS — R42 Dizziness and giddiness: Secondary | ICD-10-CM | POA: Diagnosis not present

## 2015-12-31 DIAGNOSIS — R5381 Other malaise: Secondary | ICD-10-CM | POA: Diagnosis not present

## 2015-12-31 DIAGNOSIS — R262 Difficulty in walking, not elsewhere classified: Secondary | ICD-10-CM | POA: Diagnosis not present

## 2015-12-31 DIAGNOSIS — H81393 Other peripheral vertigo, bilateral: Secondary | ICD-10-CM | POA: Diagnosis not present

## 2015-12-31 DIAGNOSIS — G3184 Mild cognitive impairment, so stated: Secondary | ICD-10-CM | POA: Diagnosis not present

## 2016-01-01 DIAGNOSIS — G4700 Insomnia, unspecified: Secondary | ICD-10-CM | POA: Diagnosis not present

## 2016-01-08 DIAGNOSIS — H81399 Other peripheral vertigo, unspecified ear: Secondary | ICD-10-CM | POA: Diagnosis not present

## 2016-01-08 DIAGNOSIS — R110 Nausea: Secondary | ICD-10-CM | POA: Diagnosis not present

## 2016-01-08 DIAGNOSIS — J3089 Other allergic rhinitis: Secondary | ICD-10-CM | POA: Diagnosis not present

## 2016-01-10 DIAGNOSIS — I9589 Other hypotension: Secondary | ICD-10-CM | POA: Diagnosis not present

## 2016-01-10 DIAGNOSIS — I10 Essential (primary) hypertension: Secondary | ICD-10-CM | POA: Diagnosis not present

## 2016-01-10 DIAGNOSIS — R42 Dizziness and giddiness: Secondary | ICD-10-CM | POA: Diagnosis not present

## 2016-01-15 DIAGNOSIS — I739 Peripheral vascular disease, unspecified: Secondary | ICD-10-CM | POA: Diagnosis not present

## 2016-01-15 DIAGNOSIS — E119 Type 2 diabetes mellitus without complications: Secondary | ICD-10-CM | POA: Diagnosis not present

## 2016-01-15 DIAGNOSIS — G4700 Insomnia, unspecified: Secondary | ICD-10-CM | POA: Diagnosis not present

## 2016-01-15 DIAGNOSIS — I639 Cerebral infarction, unspecified: Secondary | ICD-10-CM | POA: Diagnosis not present

## 2016-01-15 DIAGNOSIS — F419 Anxiety disorder, unspecified: Secondary | ICD-10-CM | POA: Diagnosis not present

## 2016-01-15 DIAGNOSIS — I10 Essential (primary) hypertension: Secondary | ICD-10-CM | POA: Diagnosis not present

## 2016-01-18 DIAGNOSIS — I10 Essential (primary) hypertension: Secondary | ICD-10-CM | POA: Diagnosis not present

## 2016-01-18 DIAGNOSIS — Z9181 History of falling: Secondary | ICD-10-CM | POA: Diagnosis not present

## 2016-01-18 DIAGNOSIS — I2510 Atherosclerotic heart disease of native coronary artery without angina pectoris: Secondary | ICD-10-CM | POA: Diagnosis not present

## 2016-01-18 DIAGNOSIS — Z8673 Personal history of transient ischemic attack (TIA), and cerebral infarction without residual deficits: Secondary | ICD-10-CM | POA: Diagnosis not present

## 2016-01-18 DIAGNOSIS — E1142 Type 2 diabetes mellitus with diabetic polyneuropathy: Secondary | ICD-10-CM | POA: Diagnosis not present

## 2016-01-18 DIAGNOSIS — F329 Major depressive disorder, single episode, unspecified: Secondary | ICD-10-CM | POA: Diagnosis not present

## 2016-01-18 DIAGNOSIS — R339 Retention of urine, unspecified: Secondary | ICD-10-CM | POA: Diagnosis not present

## 2016-01-18 DIAGNOSIS — Z853 Personal history of malignant neoplasm of breast: Secondary | ICD-10-CM | POA: Diagnosis not present

## 2016-01-18 DIAGNOSIS — R2681 Unsteadiness on feet: Secondary | ICD-10-CM | POA: Diagnosis not present

## 2016-01-18 DIAGNOSIS — K219 Gastro-esophageal reflux disease without esophagitis: Secondary | ICD-10-CM | POA: Diagnosis not present

## 2016-01-18 DIAGNOSIS — E785 Hyperlipidemia, unspecified: Secondary | ICD-10-CM | POA: Diagnosis not present

## 2016-01-21 DIAGNOSIS — F411 Generalized anxiety disorder: Secondary | ICD-10-CM | POA: Diagnosis not present

## 2016-01-21 DIAGNOSIS — N183 Chronic kidney disease, stage 3 (moderate): Secondary | ICD-10-CM | POA: Diagnosis not present

## 2016-01-21 DIAGNOSIS — Z853 Personal history of malignant neoplasm of breast: Secondary | ICD-10-CM | POA: Diagnosis not present

## 2016-01-21 DIAGNOSIS — I10 Essential (primary) hypertension: Secondary | ICD-10-CM | POA: Diagnosis not present

## 2016-01-21 DIAGNOSIS — I639 Cerebral infarction, unspecified: Secondary | ICD-10-CM | POA: Diagnosis not present

## 2016-01-21 DIAGNOSIS — G459 Transient cerebral ischemic attack, unspecified: Secondary | ICD-10-CM | POA: Diagnosis not present

## 2016-01-22 DIAGNOSIS — F329 Major depressive disorder, single episode, unspecified: Secondary | ICD-10-CM | POA: Diagnosis not present

## 2016-01-22 DIAGNOSIS — R2681 Unsteadiness on feet: Secondary | ICD-10-CM | POA: Diagnosis not present

## 2016-01-22 DIAGNOSIS — I2510 Atherosclerotic heart disease of native coronary artery without angina pectoris: Secondary | ICD-10-CM | POA: Diagnosis not present

## 2016-01-22 DIAGNOSIS — E1142 Type 2 diabetes mellitus with diabetic polyneuropathy: Secondary | ICD-10-CM | POA: Diagnosis not present

## 2016-01-22 DIAGNOSIS — R339 Retention of urine, unspecified: Secondary | ICD-10-CM | POA: Diagnosis not present

## 2016-01-22 DIAGNOSIS — I10 Essential (primary) hypertension: Secondary | ICD-10-CM | POA: Diagnosis not present

## 2016-01-23 DIAGNOSIS — I2510 Atherosclerotic heart disease of native coronary artery without angina pectoris: Secondary | ICD-10-CM | POA: Diagnosis not present

## 2016-01-23 DIAGNOSIS — F329 Major depressive disorder, single episode, unspecified: Secondary | ICD-10-CM | POA: Diagnosis not present

## 2016-01-23 DIAGNOSIS — E1142 Type 2 diabetes mellitus with diabetic polyneuropathy: Secondary | ICD-10-CM | POA: Diagnosis not present

## 2016-01-23 DIAGNOSIS — R2681 Unsteadiness on feet: Secondary | ICD-10-CM | POA: Diagnosis not present

## 2016-01-23 DIAGNOSIS — R339 Retention of urine, unspecified: Secondary | ICD-10-CM | POA: Diagnosis not present

## 2016-01-23 DIAGNOSIS — I10 Essential (primary) hypertension: Secondary | ICD-10-CM | POA: Diagnosis not present

## 2016-01-24 DIAGNOSIS — R2681 Unsteadiness on feet: Secondary | ICD-10-CM | POA: Diagnosis not present

## 2016-01-24 DIAGNOSIS — F329 Major depressive disorder, single episode, unspecified: Secondary | ICD-10-CM | POA: Diagnosis not present

## 2016-01-24 DIAGNOSIS — I10 Essential (primary) hypertension: Secondary | ICD-10-CM | POA: Diagnosis not present

## 2016-01-24 DIAGNOSIS — E1142 Type 2 diabetes mellitus with diabetic polyneuropathy: Secondary | ICD-10-CM | POA: Diagnosis not present

## 2016-01-24 DIAGNOSIS — R339 Retention of urine, unspecified: Secondary | ICD-10-CM | POA: Diagnosis not present

## 2016-01-24 DIAGNOSIS — I2510 Atherosclerotic heart disease of native coronary artery without angina pectoris: Secondary | ICD-10-CM | POA: Diagnosis not present

## 2016-01-28 DIAGNOSIS — I2510 Atherosclerotic heart disease of native coronary artery without angina pectoris: Secondary | ICD-10-CM | POA: Diagnosis not present

## 2016-01-28 DIAGNOSIS — R2681 Unsteadiness on feet: Secondary | ICD-10-CM | POA: Diagnosis not present

## 2016-01-28 DIAGNOSIS — F329 Major depressive disorder, single episode, unspecified: Secondary | ICD-10-CM | POA: Diagnosis not present

## 2016-01-28 DIAGNOSIS — I10 Essential (primary) hypertension: Secondary | ICD-10-CM | POA: Diagnosis not present

## 2016-01-28 DIAGNOSIS — R339 Retention of urine, unspecified: Secondary | ICD-10-CM | POA: Diagnosis not present

## 2016-01-28 DIAGNOSIS — E1142 Type 2 diabetes mellitus with diabetic polyneuropathy: Secondary | ICD-10-CM | POA: Diagnosis not present

## 2016-01-30 DIAGNOSIS — I10 Essential (primary) hypertension: Secondary | ICD-10-CM | POA: Diagnosis not present

## 2016-01-30 DIAGNOSIS — R2681 Unsteadiness on feet: Secondary | ICD-10-CM | POA: Diagnosis not present

## 2016-01-30 DIAGNOSIS — F329 Major depressive disorder, single episode, unspecified: Secondary | ICD-10-CM | POA: Diagnosis not present

## 2016-01-30 DIAGNOSIS — E1142 Type 2 diabetes mellitus with diabetic polyneuropathy: Secondary | ICD-10-CM | POA: Diagnosis not present

## 2016-01-30 DIAGNOSIS — I2510 Atherosclerotic heart disease of native coronary artery without angina pectoris: Secondary | ICD-10-CM | POA: Diagnosis not present

## 2016-01-30 DIAGNOSIS — R339 Retention of urine, unspecified: Secondary | ICD-10-CM | POA: Diagnosis not present

## 2016-01-31 DIAGNOSIS — R339 Retention of urine, unspecified: Secondary | ICD-10-CM | POA: Diagnosis not present

## 2016-01-31 DIAGNOSIS — R2681 Unsteadiness on feet: Secondary | ICD-10-CM | POA: Diagnosis not present

## 2016-01-31 DIAGNOSIS — E1142 Type 2 diabetes mellitus with diabetic polyneuropathy: Secondary | ICD-10-CM | POA: Diagnosis not present

## 2016-01-31 DIAGNOSIS — I10 Essential (primary) hypertension: Secondary | ICD-10-CM | POA: Diagnosis not present

## 2016-01-31 DIAGNOSIS — F329 Major depressive disorder, single episode, unspecified: Secondary | ICD-10-CM | POA: Diagnosis not present

## 2016-01-31 DIAGNOSIS — I2510 Atherosclerotic heart disease of native coronary artery without angina pectoris: Secondary | ICD-10-CM | POA: Diagnosis not present

## 2016-02-04 DIAGNOSIS — R339 Retention of urine, unspecified: Secondary | ICD-10-CM | POA: Diagnosis not present

## 2016-02-04 DIAGNOSIS — F329 Major depressive disorder, single episode, unspecified: Secondary | ICD-10-CM | POA: Diagnosis not present

## 2016-02-04 DIAGNOSIS — R2681 Unsteadiness on feet: Secondary | ICD-10-CM | POA: Diagnosis not present

## 2016-02-04 DIAGNOSIS — I10 Essential (primary) hypertension: Secondary | ICD-10-CM | POA: Diagnosis not present

## 2016-02-04 DIAGNOSIS — E1142 Type 2 diabetes mellitus with diabetic polyneuropathy: Secondary | ICD-10-CM | POA: Diagnosis not present

## 2016-02-04 DIAGNOSIS — I2510 Atherosclerotic heart disease of native coronary artery without angina pectoris: Secondary | ICD-10-CM | POA: Diagnosis not present

## 2016-02-06 DIAGNOSIS — R339 Retention of urine, unspecified: Secondary | ICD-10-CM | POA: Diagnosis not present

## 2016-02-06 DIAGNOSIS — I2510 Atherosclerotic heart disease of native coronary artery without angina pectoris: Secondary | ICD-10-CM | POA: Diagnosis not present

## 2016-02-06 DIAGNOSIS — F329 Major depressive disorder, single episode, unspecified: Secondary | ICD-10-CM | POA: Diagnosis not present

## 2016-02-06 DIAGNOSIS — I10 Essential (primary) hypertension: Secondary | ICD-10-CM | POA: Diagnosis not present

## 2016-02-06 DIAGNOSIS — R2681 Unsteadiness on feet: Secondary | ICD-10-CM | POA: Diagnosis not present

## 2016-02-06 DIAGNOSIS — E1142 Type 2 diabetes mellitus with diabetic polyneuropathy: Secondary | ICD-10-CM | POA: Diagnosis not present

## 2016-02-11 DIAGNOSIS — R339 Retention of urine, unspecified: Secondary | ICD-10-CM | POA: Diagnosis not present

## 2016-02-11 DIAGNOSIS — I10 Essential (primary) hypertension: Secondary | ICD-10-CM | POA: Diagnosis not present

## 2016-02-11 DIAGNOSIS — I2510 Atherosclerotic heart disease of native coronary artery without angina pectoris: Secondary | ICD-10-CM | POA: Diagnosis not present

## 2016-02-11 DIAGNOSIS — F329 Major depressive disorder, single episode, unspecified: Secondary | ICD-10-CM | POA: Diagnosis not present

## 2016-02-11 DIAGNOSIS — R2681 Unsteadiness on feet: Secondary | ICD-10-CM | POA: Diagnosis not present

## 2016-02-11 DIAGNOSIS — E1142 Type 2 diabetes mellitus with diabetic polyneuropathy: Secondary | ICD-10-CM | POA: Diagnosis not present

## 2016-02-13 DIAGNOSIS — F329 Major depressive disorder, single episode, unspecified: Secondary | ICD-10-CM | POA: Diagnosis not present

## 2016-02-13 DIAGNOSIS — H04123 Dry eye syndrome of bilateral lacrimal glands: Secondary | ICD-10-CM | POA: Diagnosis not present

## 2016-02-13 DIAGNOSIS — H35373 Puckering of macula, bilateral: Secondary | ICD-10-CM | POA: Diagnosis not present

## 2016-02-13 DIAGNOSIS — E1142 Type 2 diabetes mellitus with diabetic polyneuropathy: Secondary | ICD-10-CM | POA: Diagnosis not present

## 2016-02-13 DIAGNOSIS — G43809 Other migraine, not intractable, without status migrainosus: Secondary | ICD-10-CM | POA: Diagnosis not present

## 2016-02-13 DIAGNOSIS — R339 Retention of urine, unspecified: Secondary | ICD-10-CM | POA: Diagnosis not present

## 2016-02-13 DIAGNOSIS — H524 Presbyopia: Secondary | ICD-10-CM | POA: Diagnosis not present

## 2016-02-13 DIAGNOSIS — H5213 Myopia, bilateral: Secondary | ICD-10-CM | POA: Diagnosis not present

## 2016-02-13 DIAGNOSIS — I10 Essential (primary) hypertension: Secondary | ICD-10-CM | POA: Diagnosis not present

## 2016-02-13 DIAGNOSIS — R2681 Unsteadiness on feet: Secondary | ICD-10-CM | POA: Diagnosis not present

## 2016-02-13 DIAGNOSIS — I2510 Atherosclerotic heart disease of native coronary artery without angina pectoris: Secondary | ICD-10-CM | POA: Diagnosis not present

## 2016-02-18 DIAGNOSIS — L84 Corns and callosities: Secondary | ICD-10-CM | POA: Diagnosis not present

## 2016-02-18 DIAGNOSIS — E1351 Other specified diabetes mellitus with diabetic peripheral angiopathy without gangrene: Secondary | ICD-10-CM | POA: Diagnosis not present

## 2016-02-18 DIAGNOSIS — L602 Onychogryphosis: Secondary | ICD-10-CM | POA: Diagnosis not present

## 2016-02-18 DIAGNOSIS — I70293 Other atherosclerosis of native arteries of extremities, bilateral legs: Secondary | ICD-10-CM | POA: Diagnosis not present

## 2016-03-06 DIAGNOSIS — I10 Essential (primary) hypertension: Secondary | ICD-10-CM | POA: Diagnosis not present

## 2016-03-06 DIAGNOSIS — R6883 Chills (without fever): Secondary | ICD-10-CM | POA: Diagnosis not present

## 2016-03-06 DIAGNOSIS — E782 Mixed hyperlipidemia: Secondary | ICD-10-CM | POA: Diagnosis not present

## 2016-03-06 DIAGNOSIS — F411 Generalized anxiety disorder: Secondary | ICD-10-CM | POA: Diagnosis not present

## 2016-03-06 DIAGNOSIS — I639 Cerebral infarction, unspecified: Secondary | ICD-10-CM | POA: Diagnosis not present

## 2016-03-12 DIAGNOSIS — Z79899 Other long term (current) drug therapy: Secondary | ICD-10-CM | POA: Diagnosis not present

## 2016-03-12 DIAGNOSIS — R531 Weakness: Secondary | ICD-10-CM | POA: Diagnosis not present

## 2016-03-12 DIAGNOSIS — R509 Fever, unspecified: Secondary | ICD-10-CM | POA: Diagnosis not present

## 2016-03-16 DIAGNOSIS — D72829 Elevated white blood cell count, unspecified: Secondary | ICD-10-CM | POA: Diagnosis not present

## 2016-03-16 DIAGNOSIS — E871 Hypo-osmolality and hyponatremia: Secondary | ICD-10-CM | POA: Diagnosis not present

## 2016-03-17 ENCOUNTER — Encounter (HOSPITAL_COMMUNITY): Payer: Self-pay | Admitting: *Deleted

## 2016-03-17 ENCOUNTER — Observation Stay (HOSPITAL_COMMUNITY): Payer: Medicare Other

## 2016-03-17 ENCOUNTER — Inpatient Hospital Stay (HOSPITAL_COMMUNITY)
Admission: AD | Admit: 2016-03-17 | Discharge: 2016-03-24 | DRG: 640 | Disposition: A | Discharge status: Home Health | Payer: Medicare Other | Source: Ambulatory Visit | Attending: Internal Medicine | Admitting: Internal Medicine

## 2016-03-17 DIAGNOSIS — R509 Fever, unspecified: Secondary | ICD-10-CM

## 2016-03-17 DIAGNOSIS — Z8711 Personal history of peptic ulcer disease: Secondary | ICD-10-CM

## 2016-03-17 DIAGNOSIS — R05 Cough: Secondary | ICD-10-CM | POA: Diagnosis not present

## 2016-03-17 DIAGNOSIS — R0602 Shortness of breath: Secondary | ICD-10-CM | POA: Diagnosis not present

## 2016-03-17 DIAGNOSIS — J9811 Atelectasis: Secondary | ICD-10-CM | POA: Diagnosis present

## 2016-03-17 DIAGNOSIS — E7800 Pure hypercholesterolemia, unspecified: Secondary | ICD-10-CM | POA: Diagnosis present

## 2016-03-17 DIAGNOSIS — Z9049 Acquired absence of other specified parts of digestive tract: Secondary | ICD-10-CM

## 2016-03-17 DIAGNOSIS — E119 Type 2 diabetes mellitus without complications: Secondary | ICD-10-CM | POA: Diagnosis present

## 2016-03-17 DIAGNOSIS — Z8249 Family history of ischemic heart disease and other diseases of the circulatory system: Secondary | ICD-10-CM

## 2016-03-17 DIAGNOSIS — Z9011 Acquired absence of right breast and nipple: Secondary | ICD-10-CM

## 2016-03-17 DIAGNOSIS — I2510 Atherosclerotic heart disease of native coronary artery without angina pectoris: Secondary | ICD-10-CM | POA: Diagnosis present

## 2016-03-17 DIAGNOSIS — J701 Chronic and other pulmonary manifestations due to radiation: Secondary | ICD-10-CM

## 2016-03-17 DIAGNOSIS — J984 Other disorders of lung: Secondary | ICD-10-CM | POA: Diagnosis present

## 2016-03-17 DIAGNOSIS — E871 Hypo-osmolality and hyponatremia: Principal | ICD-10-CM | POA: Diagnosis present

## 2016-03-17 DIAGNOSIS — Z923 Personal history of irradiation: Secondary | ICD-10-CM

## 2016-03-17 DIAGNOSIS — D638 Anemia in other chronic diseases classified elsewhere: Secondary | ICD-10-CM

## 2016-03-17 DIAGNOSIS — Z853 Personal history of malignant neoplasm of breast: Secondary | ICD-10-CM

## 2016-03-17 DIAGNOSIS — D72829 Elevated white blood cell count, unspecified: Secondary | ICD-10-CM | POA: Diagnosis present

## 2016-03-17 DIAGNOSIS — Z87891 Personal history of nicotine dependence: Secondary | ICD-10-CM

## 2016-03-17 DIAGNOSIS — E785 Hyperlipidemia, unspecified: Secondary | ICD-10-CM | POA: Diagnosis present

## 2016-03-17 DIAGNOSIS — Z8673 Personal history of transient ischemic attack (TIA), and cerebral infarction without residual deficits: Secondary | ICD-10-CM | POA: Diagnosis present

## 2016-03-17 DIAGNOSIS — I110 Hypertensive heart disease with heart failure: Secondary | ICD-10-CM | POA: Diagnosis present

## 2016-03-17 DIAGNOSIS — Z833 Family history of diabetes mellitus: Secondary | ICD-10-CM

## 2016-03-17 DIAGNOSIS — R918 Other nonspecific abnormal finding of lung field: Secondary | ICD-10-CM

## 2016-03-17 DIAGNOSIS — R0902 Hypoxemia: Secondary | ICD-10-CM

## 2016-03-17 DIAGNOSIS — I739 Peripheral vascular disease, unspecified: Secondary | ICD-10-CM | POA: Diagnosis present

## 2016-03-17 DIAGNOSIS — F419 Anxiety disorder, unspecified: Secondary | ICD-10-CM | POA: Diagnosis present

## 2016-03-17 DIAGNOSIS — I5042 Chronic combined systolic (congestive) and diastolic (congestive) heart failure: Secondary | ICD-10-CM

## 2016-03-17 DIAGNOSIS — I10 Essential (primary) hypertension: Secondary | ICD-10-CM | POA: Diagnosis present

## 2016-03-17 DIAGNOSIS — J9601 Acute respiratory failure with hypoxia: Secondary | ICD-10-CM

## 2016-03-17 DIAGNOSIS — J9602 Acute respiratory failure with hypercapnia: Secondary | ICD-10-CM

## 2016-03-17 DIAGNOSIS — I272 Other secondary pulmonary hypertension: Secondary | ICD-10-CM | POA: Diagnosis present

## 2016-03-17 LAB — URINALYSIS, ROUTINE W REFLEX MICROSCOPIC
Bilirubin Urine: NEGATIVE
Glucose, UA: NEGATIVE mg/dL
Hgb urine dipstick: NEGATIVE
Ketones, ur: NEGATIVE mg/dL
Leukocytes, UA: NEGATIVE
Nitrite: NEGATIVE
Protein, ur: NEGATIVE mg/dL
Specific Gravity, Urine: 1.009 (ref 1.005–1.030)
pH: 7 (ref 5.0–8.0)

## 2016-03-17 LAB — CBC
HCT: 27.9 % — ABNORMAL LOW (ref 36.0–46.0)
Hemoglobin: 9.5 g/dL — ABNORMAL LOW (ref 12.0–15.0)
MCH: 29 pg (ref 26.0–34.0)
MCHC: 34.1 g/dL (ref 30.0–36.0)
MCV: 85.1 fL (ref 78.0–100.0)
Platelets: 345 10*3/uL (ref 150–400)
RBC: 3.28 MIL/uL — ABNORMAL LOW (ref 3.87–5.11)
RDW: 14.2 % (ref 11.5–15.5)
WBC: 11.5 10*3/uL — ABNORMAL HIGH (ref 4.0–10.5)

## 2016-03-17 LAB — COMPREHENSIVE METABOLIC PANEL
ALT: 34 U/L (ref 14–54)
AST: 25 U/L (ref 15–41)
Albumin: 2.9 g/dL — ABNORMAL LOW (ref 3.5–5.0)
Alkaline Phosphatase: 81 U/L (ref 38–126)
Anion gap: 11 (ref 5–15)
BUN: 10 mg/dL (ref 6–20)
CO2: 24 mmol/L (ref 22–32)
Calcium: 8.6 mg/dL — ABNORMAL LOW (ref 8.9–10.3)
Chloride: 87 mmol/L — ABNORMAL LOW (ref 101–111)
Creatinine, Ser: 0.71 mg/dL (ref 0.44–1.00)
GFR calc Af Amer: >60 mL/min (ref >60)
GFR calc non Af Amer: >60 mL/min (ref >60)
Glucose, Bld: 120 mg/dL — ABNORMAL HIGH (ref 65–99)
Potassium: 4.8 mmol/L (ref 3.5–5.1)
Sodium: 122 mmol/L — ABNORMAL LOW (ref 135–145)
Total Bilirubin: 1 mg/dL (ref 0.3–1.2)
Total Protein: 6.4 g/dL — ABNORMAL LOW (ref 6.5–8.1)

## 2016-03-17 LAB — PHOSPHORUS: Phosphorus: 3.4 mg/dL (ref 2.5–4.6)

## 2016-03-17 LAB — SODIUM, URINE, RANDOM: Sodium, Ur: 18 mmol/L

## 2016-03-17 LAB — OSMOLALITY: Osmolality: 252 mOsm/kg — ABNORMAL LOW (ref 275–295)

## 2016-03-17 LAB — MAGNESIUM: Magnesium: 1.6 mg/dL — ABNORMAL LOW (ref 1.7–2.4)

## 2016-03-17 LAB — OSMOLALITY, URINE: Osmolality, Ur: 239 mOsm/kg — ABNORMAL LOW (ref 300–900)

## 2016-03-17 MED ORDER — VITAMIN C 500 MG PO TABS
1000.0000 mg | ORAL_TABLET | Freq: Every day | ORAL | Status: DC
  Administered 2016-03-18 – 2016-03-24 (×7): 1000 mg via ORAL
  Filled 2016-03-17 (×7): qty 2

## 2016-03-17 MED ORDER — ATORVASTATIN CALCIUM 40 MG PO TABS
80.0000 mg | ORAL_TABLET | Freq: Every day | ORAL | Status: DC
  Administered 2016-03-17 – 2016-03-23 (×7): 80 mg via ORAL
  Filled 2016-03-17 (×8): qty 2

## 2016-03-17 MED ORDER — CLOPIDOGREL BISULFATE 75 MG PO TABS
75.0000 mg | ORAL_TABLET | Freq: Every day | ORAL | Status: DC
  Administered 2016-03-18 – 2016-03-24 (×7): 75 mg via ORAL
  Filled 2016-03-17 (×7): qty 1

## 2016-03-17 MED ORDER — MELATONIN 5 MG PO TABS: 10.0000 mg | ORAL_TABLET | Freq: Every evening | ORAL | Status: DC | PRN

## 2016-03-17 MED ORDER — ATENOLOL 50 MG PO TABS
100.0000 mg | ORAL_TABLET | Freq: Every day | ORAL | Status: DC
  Filled 2016-03-17: qty 2

## 2016-03-17 MED ORDER — VITAMIN D3 25 MCG (1000 UNIT) PO TABS
2000.0000 [IU] | ORAL_TABLET | Freq: Every evening | ORAL | Status: DC
  Administered 2016-03-17 – 2016-03-23 (×7): 2000 [IU] via ORAL
  Filled 2016-03-17 (×7): qty 2

## 2016-03-17 MED ORDER — ACETAMINOPHEN 500 MG PO TABS
1000.0000 mg | ORAL_TABLET | Freq: Four times a day (QID) | ORAL | Status: DC | PRN
  Administered 2016-03-18 – 2016-03-23 (×4): 1000 mg via ORAL
  Filled 2016-03-17 (×3): qty 2

## 2016-03-17 MED ORDER — PANTOPRAZOLE SODIUM 40 MG PO TBEC
40.0000 mg | DELAYED_RELEASE_TABLET | Freq: Every day | ORAL | Status: DC
  Administered 2016-03-18 – 2016-03-24 (×7): 40 mg via ORAL
  Filled 2016-03-17 (×7): qty 1

## 2016-03-17 MED ORDER — SODIUM CHLORIDE 0.9 % IV SOLN
INTRAVENOUS | Status: DC
  Administered 2016-03-17 – 2016-03-19 (×3): via INTRAVENOUS

## 2016-03-17 MED ORDER — FOLIC ACID 1 MG PO TABS
1.0000 mg | ORAL_TABLET | Freq: Every day | ORAL | Status: DC
  Administered 2016-03-17 – 2016-03-24 (×8): 1 mg via ORAL
  Filled 2016-03-17 (×8): qty 1

## 2016-03-17 MED ORDER — HEPARIN SODIUM (PORCINE) 5000 UNIT/ML IJ SOLN
5000.0000 [IU] | Freq: Three times a day (TID) | INTRAMUSCULAR | Status: DC
  Administered 2016-03-17 – 2016-03-24 (×20): 5000 [IU] via SUBCUTANEOUS
  Filled 2016-03-17 (×20): qty 1

## 2016-03-17 MED ORDER — BUSPIRONE HCL 5 MG PO TABS
5.0000 mg | ORAL_TABLET | Freq: Two times a day (BID) | ORAL | Status: DC
  Administered 2016-03-17 – 2016-03-24 (×14): 5 mg via ORAL
  Filled 2016-03-17 (×14): qty 1

## 2016-03-17 NOTE — H&P (Signed)
History and Physical    Jameca Mork [id number] DOB: [birth year] DOA: 03/17/2016  PCP: [name], MD  Patient coming from: Home  Chief Complaint: Dizziness, nausea in the mornings, reports of elevated white blood cell count and recent reports of low sodium of 118.  HPI: Raygan Dedon is a 82 y.o. female with medical history significant of history of stroke, hypertension, hyperlipidemia, history of breast cancer status post resection in the 60s. Patient states that she has been within the last several days feeling dizziness and nausea in the mornings. She reports she has not been eating well within the last 2 months. She denies any symptoms associated with active signs of infection. Does state however that 2 weeks ago she did have a fever. She has been on antibiotics for the last 5 days. Was seen yesterday at her family practice office and was referred for admission after patient was found to have a serum sodium level of  Review of Systems: As per HPI otherwise 10 point review of systems negative.   Past Medical History:  Diagnosis Date  . Anemia   . CAD (coronary artery disease)   . Cancer [facility])    Breast cancer  . Diabetes mellitus   . Hypercholesteremia   . Hypertension   . Peripheral vascular disease ([place])   . Stroke [facility])    reports TIA 3-4 weeks ago    Past Surgical History:  Procedure Laterality Date  . ABDOMINAL HYSTERECTOMY    . APPENDECTOMY  1951  . [place]  . CHOLECYSTECTOMY  2003   By Dr. Excell Seltzer  . EYE SURGERY Bilateral 2009   Cataract surgery  . FRACTURE SURGERY Right    ORIF   by Dr. Burney Gauze  . HUMERUS FRACTURE SURGERY    . MASTECTOMY Bilateral   . NASAL SEPTUM SURGERY  1977   Dr. Ernesto Rutherford  . TOENAIL EXCISION       reports that she quit smoking about 37 years ago. She has never used smokeless tobacco. She reports that she does not drink alcohol or use drugs.  Allergies  Allergen Reactions  . Morphine And Related  Nausea And Vomiting  . Other Other (See Comments)    Barley & Carrots: learned through allergy testing. Unknown reaction  . Sulfa Antibiotics Hives  . Nickel Rash    Family History  Problem Relation Age of Onset  . Cancer Mother   . Heart disease Father   . Heart attack Father   . Diabetes Brother   . Heart disease Brother   . Hyperlipidemia Brother     Prior to Admission medications   Medication Sig Start Date End Date Taking? Authorizing Provider  acetaminophen (TYLENOL) 500 MG tablet Take 1,000 mg by mouth every 6 (six) hours as needed for moderate pain or headache.   Yes Historical Provider, MD  atenolol (TENORMIN) 50 MG tablet Take 100 mg by mouth daily.    Yes Historical Provider, MD  atorvastatin (LIPITOR) 80 MG tablet Take 80 mg by mouth at bedtime.    Yes Historical Provider, MD  BIOTIN PO Take 1 tablet by mouth daily.   Yes Historical Provider, MD  bismuth subsalicylate (PEPTO BISMOL) 262 MG/15ML suspension Take 30 mLs by mouth every 6 (six) hours as needed (nausea).    Yes Historical Provider, MD  busPIRone (BUSPAR) 5 MG tablet Take 1 tablet (5 mg total) by mouth 2 (two) times daily. 12/18/15  Yes Reyne Dumas, MD  cholecalciferol (VITAMIN D) 1000  units tablet Take 2,000 Units by mouth every evening.   Yes Historical Provider, MD  clopidogrel (PLAVIX) 75 MG tablet Take 75 mg by mouth daily.   Yes Historical Provider, MD  FOLIC ACID PO Take 1 tablet by mouth daily.   Yes Historical Provider, MD  levofloxacin (LEVAQUIN) 500 MG tablet Take 1 tablet (500 mg total) by mouth daily. 12/18/15  Yes Reyne Dumas, MD  Melatonin 5 MG TABS Take 10 mg by mouth at bedtime as needed (sleep).   Yes Historical Provider, MD  Multiple Vitamin (MULTIVITAMIN WITH MINERALS) TABS tablet Take 1 tablet by mouth daily.    Yes Historical Provider, MD  omeprazole (PRILOSEC) 20 MG capsule Take 20 mg by mouth every morning.    Yes Historical Provider, MD  ramipril (ALTACE) 10 MG capsule Take 20 mg by mouth  daily.    Yes Historical Provider, MD  vitamin C (ASCORBIC ACID) 500 MG tablet Take 1,000 mg by mouth daily.   Yes Historical Provider, MD    Physical Exam: Vitals:   03/17/16 1617  BP: (!) 152/56  Pulse: 61  Resp: 19  Temp: 98.3 F (36.8 C)  TempSrc: Oral  SpO2: 95%  Weight: 61.9 kg (136 lb 8 oz)  Height: 5\' 3"  (1.6 m)      Constitutional: NAD, calm, comfortable Vitals:   03/17/16 1617  BP: (!) 152/56  Pulse: 61  Resp: 19  Temp: 98.3 F (36.8 C)  TempSrc: Oral  SpO2: 95%  Weight: 61.9 kg (136 lb 8 oz)  Height: 5\' 3"  (1.6 m)   Eyes: PERRL, lids and conjunctivae normal ENMT: Mucous membranes are moist. Posterior pharynx clear of any exudate or lesions.Normal dentition.  Neck: normal, supple, no masses, no thyromegaly Respiratory: clear to auscultation bilaterally, no wheezing, no crackles. Normal respiratory effort. No accessory muscle use.  Cardiovascular: Regular rate and rhythm, no murmurs / rubs / gallops. No extremity edema. 2+ pedal pulses. No carotid bruits.  Abdomen: no tenderness, no masses palpated. No hepatosplenomegaly. Bowel sounds positive.  Musculoskeletal: no clubbing / cyanosis. No joint deformity upper and lower extremities. Good ROM, no contractures. Normal muscle tone.  Skin: no rashes, lesions, ulcers. No indurationOn limited exam Neurologic: Answers questions appropriately, no facial asymmetry, sensation to light touch intact Psychiatric: Normal judgment and insight. Alert and oriented x 3. Normal mood.   Of note no labs available to examiner on admission  CBC: No results for input(s): WBC, NEUTROABS, HGB, HCT, MCV, PLT in the last 168 hours. Basic Metabolic Panel: No results for input(s): NA, K, CL, CO2, GLUCOSE, BUN, CREATININE, CALCIUM, MG, PHOS in the last 168 hours. GFR: CrCl cannot be calculated (Patient's most recent lab result is older than the maximum 21 days allowed.). Liver Function Tests: No results for input(s): AST, ALT, ALKPHOS,  BILITOT, PROT, ALBUMIN in the last 168 hours. No results for input(s): LIPASE, AMYLASE in the last 168 hours. No results for input(s): AMMONIA in the last 168 hours. Coagulation Profile: No results for input(s): INR, PROTIME in the last 168 hours. Cardiac Enzymes: No results for input(s): CKTOTAL, CKMB, CKMBINDEX, TROPONINI in the last 168 hours. BNP (last 3 results) No results for input(s): PROBNP in the last 8760 hours. HbA1C: No results for input(s): HGBA1C in the last 72 hours. CBG: No results for input(s): GLUCAP in the last 168 hours. Lipid Profile: No results for input(s): CHOL, HDL, LDLCALC, TRIG, CHOLHDL, LDLDIRECT in the last 72 hours. Thyroid Function Tests: No results for input(s): TSH, T4TOTAL, FREET4,  T3FREE, THYROIDAB in the last 72 hours. Anemia Panel: No results for input(s): VITAMINB12, FOLATE, FERRITIN, TIBC, IRON, RETICCTPCT in the last 72 hours. Urine analysis:    Component Value Date/Time   COLORURINE YELLOW 12/16/2015 1101   APPEARANCEUR CLEAR 12/16/2015 1101   LABSPEC 1.013 12/16/2015 1101   PHURINE 7.5 12/16/2015 1101   GLUCOSEU NEGATIVE 12/16/2015 1101   HGBUR SMALL (A) 12/16/2015 1101   BILIRUBINUR NEGATIVE 12/16/2015 1101   KETONESUR NEGATIVE 12/16/2015 1101   PROTEINUR 30 (A) 12/16/2015 1101   NITRITE NEGATIVE 12/16/2015 1101   LEUKOCYTESUR NEGATIVE 12/16/2015 1101   Sepsis Labs: !!!!!!!!!!!!!!!!!!!!!!!!!!!!!!!!!!!!!!!!!!!! @LABRCNTIP (procalcitonin:4,lacticidven:4) )No results found for this or any previous visit (from the past 240 hour(s)).   Radiological Exams on Admission: No results found.   Assessment/Plan Active Problems:   Hyponatremia - No lab work to confirm starting value but per minute discussion with patient her sodium level was 118 on BMP drawn yesterday. His sodium corrects to 126 in a 24-hour period will plan on holding fluids - Suspect it is secondary to poor oral solute intake at this moment - We'll obtain urine and plasma  osmolality    Hypercholesteremia - Stable    Stroke (HCC) - We'll continue home medication regimen    CAD (coronary artery disease) - Stable continue home medication regimen    Leukocytosis - Unclear source. No signs of infection. Patient has been on antibiotics for the last 5 days. Will obtain workup which includes urinalysis, chest x-ray, blood cultures. Will have low threshold to start empiric antibiotics should patient spike fever.    Essential hypertension  - Continue beta blockers since I do not know what patient's serum creatinine and this will hold ACE inhibitor at this juncture.   DVT prophylaxis: Heparin Code Status: Full Family Communication: d/c patient and daughter at bedside Disposition Plan: med surge with bmp q 6 hrs Consults called: none Admission status: observation   Velvet Bathe MD [facility] Pager [phone]  If 7PM-7AM, please contact night-coverage [email or web address] Password [email or web address]  03/17/2016, 5:50 PM

## 2016-03-17 NOTE — Plan of Care (Signed)
Problem: Safety: Goal: Ability to remain free from injury will improve Outcome: Completed/Met Date Met: 03/17/16 Safety prevention plan discussed with patient and daughter. Pt is agreeable to safety plan

## 2016-03-17 NOTE — Progress Notes (Signed)

## 2016-03-18 DIAGNOSIS — I5042 Chronic combined systolic (congestive) and diastolic (congestive) heart failure: Secondary | ICD-10-CM | POA: Diagnosis present

## 2016-03-18 DIAGNOSIS — Z9011 Acquired absence of right breast and nipple: Secondary | ICD-10-CM | POA: Diagnosis not present

## 2016-03-18 DIAGNOSIS — Z9049 Acquired absence of other specified parts of digestive tract: Secondary | ICD-10-CM | POA: Diagnosis not present

## 2016-03-18 DIAGNOSIS — J984 Other disorders of lung: Secondary | ICD-10-CM | POA: Diagnosis present

## 2016-03-18 DIAGNOSIS — F419 Anxiety disorder, unspecified: Secondary | ICD-10-CM | POA: Diagnosis present

## 2016-03-18 DIAGNOSIS — I2510 Atherosclerotic heart disease of native coronary artery without angina pectoris: Secondary | ICD-10-CM | POA: Diagnosis not present

## 2016-03-18 DIAGNOSIS — I10 Essential (primary) hypertension: Secondary | ICD-10-CM | POA: Diagnosis not present

## 2016-03-18 DIAGNOSIS — E7800 Pure hypercholesterolemia, unspecified: Secondary | ICD-10-CM | POA: Diagnosis not present

## 2016-03-18 DIAGNOSIS — D638 Anemia in other chronic diseases classified elsewhere: Secondary | ICD-10-CM | POA: Diagnosis not present

## 2016-03-18 DIAGNOSIS — Z8249 Family history of ischemic heart disease and other diseases of the circulatory system: Secondary | ICD-10-CM | POA: Diagnosis not present

## 2016-03-18 DIAGNOSIS — Z833 Family history of diabetes mellitus: Secondary | ICD-10-CM | POA: Diagnosis not present

## 2016-03-18 DIAGNOSIS — R42 Dizziness and giddiness: Secondary | ICD-10-CM | POA: Diagnosis not present

## 2016-03-18 DIAGNOSIS — D72829 Elevated white blood cell count, unspecified: Secondary | ICD-10-CM | POA: Diagnosis not present

## 2016-03-18 DIAGNOSIS — E119 Type 2 diabetes mellitus without complications: Secondary | ICD-10-CM | POA: Diagnosis present

## 2016-03-18 DIAGNOSIS — I110 Hypertensive heart disease with heart failure: Secondary | ICD-10-CM | POA: Diagnosis present

## 2016-03-18 DIAGNOSIS — Z853 Personal history of malignant neoplasm of breast: Secondary | ICD-10-CM | POA: Diagnosis not present

## 2016-03-18 DIAGNOSIS — Z8673 Personal history of transient ischemic attack (TIA), and cerebral infarction without residual deficits: Secondary | ICD-10-CM | POA: Diagnosis not present

## 2016-03-18 DIAGNOSIS — J9601 Acute respiratory failure with hypoxia: Secondary | ICD-10-CM | POA: Diagnosis not present

## 2016-03-18 DIAGNOSIS — E785 Hyperlipidemia, unspecified: Secondary | ICD-10-CM | POA: Diagnosis present

## 2016-03-18 DIAGNOSIS — Z8711 Personal history of peptic ulcer disease: Secondary | ICD-10-CM | POA: Diagnosis not present

## 2016-03-18 DIAGNOSIS — Z923 Personal history of irradiation: Secondary | ICD-10-CM | POA: Diagnosis not present

## 2016-03-18 DIAGNOSIS — E871 Hypo-osmolality and hyponatremia: Secondary | ICD-10-CM | POA: Diagnosis not present

## 2016-03-18 DIAGNOSIS — I739 Peripheral vascular disease, unspecified: Secondary | ICD-10-CM | POA: Diagnosis present

## 2016-03-18 DIAGNOSIS — I272 Other secondary pulmonary hypertension: Secondary | ICD-10-CM | POA: Diagnosis present

## 2016-03-18 DIAGNOSIS — R0902 Hypoxemia: Secondary | ICD-10-CM | POA: Diagnosis not present

## 2016-03-18 DIAGNOSIS — Z87891 Personal history of nicotine dependence: Secondary | ICD-10-CM | POA: Diagnosis not present

## 2016-03-18 DIAGNOSIS — I517 Cardiomegaly: Secondary | ICD-10-CM | POA: Diagnosis not present

## 2016-03-18 DIAGNOSIS — J9811 Atelectasis: Secondary | ICD-10-CM | POA: Diagnosis present

## 2016-03-18 DIAGNOSIS — J90 Pleural effusion, not elsewhere classified: Secondary | ICD-10-CM | POA: Diagnosis not present

## 2016-03-18 LAB — BASIC METABOLIC PANEL
Anion gap: 11 (ref 5–15)
Anion gap: 9 (ref 5–15)
Anion gap: 9 (ref 5–15)
Anion gap: 8 (ref 5–15)
BUN: 11 mg/dL (ref 6–20)
BUN: 9 mg/dL (ref 6–20)
BUN: 10 mg/dL (ref 6–20)
BUN: 11 mg/dL (ref 6–20)
CO2: 27 mmol/L (ref 22–32)
CO2: 26 mmol/L (ref 22–32)
CO2: 23 mmol/L (ref 22–32)
CO2: 24 mmol/L (ref 22–32)
Calcium: 8.8 mg/dL — ABNORMAL LOW (ref 8.9–10.3)
Calcium: 8.3 mg/dL — ABNORMAL LOW (ref 8.9–10.3)
Calcium: 7.7 mg/dL — ABNORMAL LOW (ref 8.9–10.3)
Calcium: 7.7 mg/dL — ABNORMAL LOW (ref 8.9–10.3)
Chloride: 87 mmol/L — ABNORMAL LOW (ref 101–111)
Chloride: 89 mmol/L — ABNORMAL LOW (ref 101–111)
Chloride: 92 mmol/L — ABNORMAL LOW (ref 101–111)
Chloride: 92 mmol/L — ABNORMAL LOW (ref 101–111)
Creatinine, Ser: 0.69 mg/dL (ref 0.44–1.00)
Creatinine, Ser: 0.67 mg/dL (ref 0.44–1.00)
Creatinine, Ser: 0.62 mg/dL (ref 0.44–1.00)
Creatinine, Ser: 0.71 mg/dL (ref 0.44–1.00)
GFR calc Af Amer: >60 mL/min (ref >60)
GFR calc Af Amer: >60 mL/min (ref >60)
GFR calc Af Amer: >60 mL/min (ref >60)
GFR calc Af Amer: >60 mL/min (ref >60)
GFR calc non Af Amer: >60 mL/min (ref >60)
GFR calc non Af Amer: >60 mL/min (ref >60)
GFR calc non Af Amer: >60 mL/min (ref >60)
GFR calc non Af Amer: >60 mL/min (ref >60)
Glucose, Bld: 119 mg/dL — ABNORMAL HIGH (ref 65–99)
Glucose, Bld: 117 mg/dL — ABNORMAL HIGH (ref 65–99)
Glucose, Bld: 238 mg/dL — ABNORMAL HIGH (ref 65–99)
Glucose, Bld: 153 mg/dL — ABNORMAL HIGH (ref 65–99)
Potassium: 4.8 mmol/L (ref 3.5–5.1)
Potassium: 4.1 mmol/L (ref 3.5–5.1)
Potassium: 3.9 mmol/L (ref 3.5–5.1)
Potassium: 4.5 mmol/L (ref 3.5–5.1)
Sodium: 125 mmol/L — ABNORMAL LOW (ref 135–145)
Sodium: 124 mmol/L — ABNORMAL LOW (ref 135–145)
Sodium: 124 mmol/L — ABNORMAL LOW (ref 135–145)
Sodium: 124 mmol/L — ABNORMAL LOW (ref 135–145)

## 2016-03-18 MED ORDER — ONDANSETRON HCL 4 MG/2ML IJ SOLN
4.0000 mg | Freq: Four times a day (QID) | INTRAMUSCULAR | Status: DC | PRN
  Administered 2016-03-18 – 2016-03-21 (×3): 4 mg via INTRAVENOUS
  Filled 2016-03-18 (×4): qty 2

## 2016-03-18 NOTE — Progress Notes (Signed)
PROGRESS NOTE    Helen [name]  [id number] DOB: 02/29/1930 DOA: 03/17/2016  PCP: [name], MD   Brief Narrative:  82 y/o with h/o CVA, HTN, HLD, breast cancer presenting for dizziness, daily AM nausea and a low sodium. Sodium 122 in ER.   Subjective: Still a bit dizzy with heaviness in the top of her head which is not new. No other complaints.   Assessment & Plan:   Principal Problem:   Hyponatremia - sodium only mildly improved with NS -U sodium 18, U osm 239, S osm 252  - cont IVF for now- I and O and daily weights - had extensive work up last admission and no cause was found  Active Problems: Nausea - states she is eating much more in the hospital- no h/o GERD - follow for improvement with correction of sodium    Hypercholesteremia - lipitor   CVA and  CAD (coronary artery disease) - Plavix, Lipitor    Leukocytosis - mild- was placed on Levaquin for possible UTI- she did not have symptoms of a UTI- UA negative in ER- follow    Essential hypertension - BP low- hold atenolol and ramipril   DVT prophylaxis: heparin Code Status: full code Family Communication: daughter Disposition Plan:  Consultants:   none Procedures:   none Antimicrobials:  Anti-infectives    None       Objective: Vitals:   03/17/16 2114 03/18/16 0443 03/18/16 1049 03/18/16 1337  BP: (!) 140/51 (!) 140/49 (!) 112/40 (!) 124/34  Pulse: 67 69  70  Resp: 19 18  20   Temp: 98.4 F (36.9 C) 98.8 F (37.1 C)  99.2 F (37.3 C)  TempSrc: Oral Oral  Oral  SpO2: 94% 93%  93%  Weight:      Height:        Intake/Output Summary (Last 24 hours) at 03/18/16 1707 Last data filed at 03/18/16 1350  Gross per 24 hour  Intake          1213.75 ml  Output             1450 ml  Net          -236.25 ml   Filed Weights   03/17/16 1617  Weight: 61.9 kg (136 lb 8 oz)    Examination: General exam: Appears comfortable  HEENT: PERRLA, oral mucosa moist, no sclera icterus or  thrush Respiratory system: Clear to auscultation. Respiratory effort normal. Cardiovascular system: S1 & S2 heard, RRR.  No murmurs  Gastrointestinal system: Abdomen soft, non-tender, nondistended. Normal bowel sound. No organomegaly Central nervous system: Alert and oriented. No focal neurological deficits. Extremities: No cyanosis, clubbing or edema Skin: No rashes or ulcers Psychiatry:  Mood & affect appropriate.     Data Reviewed: I have personally reviewed following labs and imaging studies  CBC:  Recent Labs Lab 03/17/16 1833  WBC 11.5*  HGB 9.5*  HCT 27.9*  MCV 85.1  PLT [id number]   Basic Metabolic Panel:  Recent Labs Lab 03/17/16 1833 03/18/16 0038 03/18/16 0649 03/18/16 1156  NA 122* 125* 124* 124*  K 4.8 4.8 4.1 3.9  CL 87* 87* 89* 92*  CO2 24 27 26 23   GLUCOSE 120* 119* 117* 238*  BUN 10 11 9 10   CREATININE 0.71 0.69 0.67 0.62  CALCIUM 8.6* 8.8* 8.3* 7.7*  MG 1.6*  --   --   --   PHOS 3.4  --   --   --    GFR: Estimated Creatinine  Clearance: 43.3 mL/min (by C-G formula based on SCr of 0.8 mg/dL). Liver Function Tests:  Recent Labs Lab 03/17/16 1833  AST 25  ALT 34  ALKPHOS 81  BILITOT 1.0  PROT 6.4*  ALBUMIN 2.9*   No results for input(s): LIPASE, AMYLASE in the last 168 hours. No results for input(s): AMMONIA in the last 168 hours. Coagulation Profile: No results for input(s): INR, PROTIME in the last 168 hours. Cardiac Enzymes: No results for input(s): CKTOTAL, CKMB, CKMBINDEX, TROPONINI in the last 168 hours. BNP (last 3 results) No results for input(s): PROBNP in the last 8760 hours. HbA1C: No results for input(s): HGBA1C in the last 72 hours. CBG: No results for input(s): GLUCAP in the last 168 hours. Lipid Profile: No results for input(s): CHOL, HDL, LDLCALC, TRIG, CHOLHDL, LDLDIRECT in the last 72 hours. Thyroid Function Tests: No results for input(s): TSH, T4TOTAL, FREET4, T3FREE, THYROIDAB in the last 72 hours. Anemia Panel: No  results for input(s): VITAMINB12, FOLATE, FERRITIN, TIBC, IRON, RETICCTPCT in the last 72 hours. Urine analysis:    Component Value Date/Time   COLORURINE YELLOW 03/17/2016 1740   APPEARANCEUR CLEAR 03/17/2016 1740   LABSPEC 1.009 03/17/2016 1740   PHURINE 7.0 03/17/2016 1740   GLUCOSEU NEGATIVE 03/17/2016 1740   HGBUR NEGATIVE 03/17/2016 1740   BILIRUBINUR NEGATIVE 03/17/2016 1740   KETONESUR NEGATIVE 03/17/2016 1740   PROTEINUR NEGATIVE 03/17/2016 1740   NITRITE NEGATIVE 03/17/2016 1740   LEUKOCYTESUR NEGATIVE 03/17/2016 1740   Sepsis Labs: @LABRCNTIP (procalcitonin:4,lacticidven:4) ) Recent Results (from the past 240 hour(s))  Culture, blood (Routine X 2) w Reflex to ID Panel     Status: None (Preliminary result)   Collection Time: 03/17/16  6:33 PM  Result Value Ref Range Status   Specimen Description BLOOD LEFT ARM  Final   Special Requests BOTTLES DRAWN AEROBIC AND ANAEROBIC 5CC  Final   Culture   Final    NO GROWTH < 24 HOURS Performed at [facility]    Report Status PENDING  Incomplete  Culture, blood (Routine X 2) w Reflex to ID Panel     Status: None (Preliminary result)   Collection Time: 03/17/16  6:33 PM  Result Value Ref Range Status   Specimen Description BLOOD RIGHT WRIST  Final   Special Requests IN PEDIATRIC BOTTLE .5CC  Final   Culture   Final    NO GROWTH < 24 HOURS Performed at [facility]    Report Status PENDING  Incomplete         Radiology Studies: Dg Chest 2 View  Result Date: 03/17/2016 CLINICAL DATA:  Shortness of breath and cough. EXAM: CHEST  2 VIEW COMPARISON:  12/16/2015 FINDINGS: Lungs are hyperexpanded consistent with emphysema. Right upper lung scarring is again noted. Stable appearance of peripheral calcified granuloma. Interstitial changes at the bases are likely scarring although there is some increase alveolar opacity at the right base suggesting pneumonia. The cardio pericardial silhouette is enlarged. The  visualized bony structures of the thorax are intact. IMPRESSION: Emphysema with chronic interstitial lung disease. New focal airspace opacity at the right base compatible with pneumonia. Electronically Signed   By: Misty Stanley M.D.   On: 03/17/2016 19:52     Scheduled Meds: . atorvastatin  80 mg Oral QHS  . busPIRone  5 mg Oral BID  . cholecalciferol  2,000 Units Oral QPM  . clopidogrel  75 mg Oral Daily  . folic acid  1 mg Oral Daily  . heparin  5,000 Units  Subcutaneous Q8H  . pantoprazole  40 mg Oral Daily  . vitamin C  1,000 mg Oral Daily   Continuous Infusions: . sodium chloride 125 mL/hr at 03/18/16 0832     LOS: 1 day    Time spent in minutes: 6    [place], MD [facility] Pager: [email or web address] Password [facility] 03/18/2016, 5:07 PM

## 2016-03-19 DIAGNOSIS — E7800 Pure hypercholesterolemia, unspecified: Secondary | ICD-10-CM

## 2016-03-19 LAB — CBC
HCT: 27.5 % — ABNORMAL LOW (ref 36.0–46.0)
Hemoglobin: 9.2 g/dL — ABNORMAL LOW (ref 12.0–15.0)
MCH: 28.9 pg (ref 26.0–34.0)
MCHC: 33.5 g/dL (ref 30.0–36.0)
MCV: 86.5 fL (ref 78.0–100.0)
Platelets: 334 10*3/uL (ref 150–400)
RBC: 3.18 MIL/uL — ABNORMAL LOW (ref 3.87–5.11)
RDW: 14.6 % (ref 11.5–15.5)
WBC: 8.3 10*3/uL (ref 4.0–10.5)

## 2016-03-19 LAB — BASIC METABOLIC PANEL
Anion gap: 6 (ref 5–15)
Anion gap: 6 (ref 5–15)
Anion gap: 8 (ref 5–15)
BUN: 10 mg/dL (ref 6–20)
BUN: 10 mg/dL (ref 6–20)
BUN: 12 mg/dL (ref 6–20)
CO2: 24 mmol/L (ref 22–32)
CO2: 24 mmol/L (ref 22–32)
CO2: 25 mmol/L (ref 22–32)
Calcium: 7.7 mg/dL — ABNORMAL LOW (ref 8.9–10.3)
Calcium: 7.9 mg/dL — ABNORMAL LOW (ref 8.9–10.3)
Calcium: 8 mg/dL — ABNORMAL LOW (ref 8.9–10.3)
Chloride: 94 mmol/L — ABNORMAL LOW (ref 101–111)
Chloride: 96 mmol/L — ABNORMAL LOW (ref 101–111)
Chloride: 99 mmol/L — ABNORMAL LOW (ref 101–111)
Creatinine, Ser: 0.54 mg/dL (ref 0.44–1.00)
Creatinine, Ser: 0.61 mg/dL (ref 0.44–1.00)
Creatinine, Ser: 0.64 mg/dL (ref 0.44–1.00)
GFR calc Af Amer: >60 mL/min (ref >60)
GFR calc Af Amer: >60 mL/min (ref >60)
GFR calc Af Amer: >60 mL/min (ref >60)
GFR calc non Af Amer: >60 mL/min (ref >60)
GFR calc non Af Amer: >60 mL/min (ref >60)
GFR calc non Af Amer: >60 mL/min (ref >60)
Glucose, Bld: 113 mg/dL — ABNORMAL HIGH (ref 65–99)
Glucose, Bld: 123 mg/dL — ABNORMAL HIGH (ref 65–99)
Glucose, Bld: 210 mg/dL — ABNORMAL HIGH (ref 65–99)
Potassium: 3.7 mmol/L (ref 3.5–5.1)
Potassium: 3.9 mmol/L (ref 3.5–5.1)
Potassium: 4.1 mmol/L (ref 3.5–5.1)
Sodium: 124 mmol/L — ABNORMAL LOW (ref 135–145)
Sodium: 129 mmol/L — ABNORMAL LOW (ref 135–145)
Sodium: 129 mmol/L — ABNORMAL LOW (ref 135–145)

## 2016-03-19 MED ORDER — POLYETHYLENE GLYCOL 3350 17 G PO PACK
17.0000 g | PACK | Freq: Every day | ORAL | Status: DC | PRN
Start: 2016-03-19 — End: 2016-03-24
  Administered 2016-03-19 – 2016-03-20 (×2): 17 g via ORAL
  Filled 2016-03-19 (×2): qty 1

## 2016-03-19 MED ORDER — SODIUM CHLORIDE 1 G PO TABS
1.0000 g | ORAL_TABLET | ORAL | Status: AC
  Administered 2016-03-19 (×2): 1 g via ORAL
  Filled 2016-03-19 (×2): qty 1

## 2016-03-19 MED ORDER — FUROSEMIDE 10 MG/ML IJ SOLN
20.0000 mg | Freq: Once | INTRAMUSCULAR | Status: AC
  Administered 2016-03-19: 20 mg via INTRAVENOUS
  Filled 2016-03-19: qty 2

## 2016-03-19 NOTE — Progress Notes (Signed)
Spoke with pt and daughter at bedside concerning HHPT. Pt selected [facility] in [place].  Referral called to intake at [place].  At discharge: DC summary, H&P and Med list will need to be faxed to [phone].

## 2016-03-19 NOTE — Progress Notes (Signed)
PROGRESS NOTE    Helen [name]  [id number] DOB: 1929-11-16 DOA: 03/17/2016  PCP: [name], MD   Brief Narrative:  82 y/o with h/o CVA, HTN, HLD, breast cancer presenting for dizziness, daily AM nausea and a low sodium. Sodium 122 in ER.   Subjective: Still a bit dizzy with heaviness in the top of her head which is not new. No other complaints.   Assessment & Plan:   Principal Problem:   Hyponatremia  - sodium only improved with NS - had extensive work up last admission and no cause was found -U sodium 18, U osm 239, S osm 252  - crackles in RLL today - pulse ox 95% on room air- weight up by 2 kg-  Atelectasis vs fluid collection? - will give a low dose lasix - cont to follow I and O and daily weights   Active Problems: Nausea - states she is eating much more in the hospital- no h/o GERD - follow for improvement with correction of sodium - appears to be improving    Hypercholesteremia - lipitor   CVA and  CAD (coronary artery disease) - Plavix, Lipitor    Leukocytosis - mild- was placed on Levaquin for possible UTI- she did not have symptoms of a UTI- UA negative in ER- follow    Essential hypertension - BP was low- holding atenolol and ramipril   DVT prophylaxis: heparin Code Status: full code Family Communication: daughter Disposition Plan:  Consultants:   none Procedures:   none Antimicrobials:  Anti-infectives    None       Objective: Vitals:   03/18/16 1724 03/18/16 2030 03/19/16 0546 03/19/16 1000  BP: (!) 154/67 (!) 135/40 (!) 151/56   Pulse: 83 75 75   Resp:  20 20   Temp: 98.6 F (37 C) 98.6 F (37 C) 97.6 F (36.4 C)   TempSrc: Oral Oral Oral   SpO2: 93% 94% 94%   Weight:    63.7 kg (140 lb 8 oz)  Height:    5\' 3"  (1.6 m)    Intake/Output Summary (Last 24 hours) at 03/19/16 1306 Last data filed at 03/19/16 1207  Gross per 24 hour  Intake          3638.33 ml  Output             2700 ml  Net           938.33 ml    Filed Weights   03/17/16 1617 03/19/16 1000  Weight: 61.9 kg (136 lb 8 oz) 63.7 kg (140 lb 8 oz)    Examination: General exam: Appears comfortable  HEENT: PERRLA, oral mucosa moist, no sclera icterus or thrush Respiratory system: Clear to auscultation. Respiratory effort normal. Cardiovascular system: S1 & S2 heard, RRR.  No murmurs  Gastrointestinal system: Abdomen soft, non-tender, nondistended. Normal bowel sound. No organomegaly Central nervous system: Alert and oriented. No focal neurological deficits. Extremities: No cyanosis, clubbing or edema Skin: No rashes or ulcers Psychiatry:  Mood & affect appropriate.     Data Reviewed: I have personally reviewed following labs and imaging studies  CBC:  Recent Labs Lab 03/17/16 1833 03/19/16 0530  WBC 11.5* 8.3  HGB 9.5* 9.2*  HCT 27.9* 27.5*  MCV 85.1 86.5  PLT 345 [id number]   Basic Metabolic Panel:  Recent Labs Lab 03/17/16 1833  03/18/16 1156 03/18/16 1831 03/18/16 2352 03/19/16 0530 03/19/16 1145  NA 122*  < > 124* 124* 124* 129* 129*  K 4.8  < >  3.9 4.5 4.1 3.9 3.7  CL 87*  < > 92* 92* 94* 99* 96*  CO2 24  < > 23 24 24 24 25   GLUCOSE 120*  < > 238* 153* 123* 113* 210*  BUN 10  < > 10 11 12 10 10   CREATININE 0.71  < > 0.62 0.71 0.61 0.54 0.64  CALCIUM 8.6*  < > 7.7* 7.7* 7.7* 7.9* 8.0*  MG 1.6*  --   --   --   --   --   --   PHOS 3.4  --   --   --   --   --   --   < > = values in this interval not displayed. GFR: Estimated Creatinine Clearance: 47 mL/min (by C-G formula based on SCr of 0.8 mg/dL). Liver Function Tests:  Recent Labs Lab 03/17/16 1833  AST 25  ALT 34  ALKPHOS 81  BILITOT 1.0  PROT 6.4*  ALBUMIN 2.9*   No results for input(s): LIPASE, AMYLASE in the last 168 hours. No results for input(s): AMMONIA in the last 168 hours. Coagulation Profile: No results for input(s): INR, PROTIME in the last 168 hours. Cardiac Enzymes: No results for input(s): CKTOTAL, CKMB, CKMBINDEX, TROPONINI in  the last 168 hours. BNP (last 3 results) No results for input(s): PROBNP in the last 8760 hours. HbA1C: No results for input(s): HGBA1C in the last 72 hours. CBG: No results for input(s): GLUCAP in the last 168 hours. Lipid Profile: No results for input(s): CHOL, HDL, LDLCALC, TRIG, CHOLHDL, LDLDIRECT in the last 72 hours. Thyroid Function Tests: No results for input(s): TSH, T4TOTAL, FREET4, T3FREE, THYROIDAB in the last 72 hours. Anemia Panel: No results for input(s): VITAMINB12, FOLATE, FERRITIN, TIBC, IRON, RETICCTPCT in the last 72 hours. Urine analysis:    Component Value Date/Time   COLORURINE YELLOW 03/17/2016 1740   APPEARANCEUR CLEAR 03/17/2016 1740   LABSPEC 1.009 03/17/2016 1740   PHURINE 7.0 03/17/2016 1740   GLUCOSEU NEGATIVE 03/17/2016 1740   HGBUR NEGATIVE 03/17/2016 1740   BILIRUBINUR NEGATIVE 03/17/2016 1740   KETONESUR NEGATIVE 03/17/2016 1740   PROTEINUR NEGATIVE 03/17/2016 1740   NITRITE NEGATIVE 03/17/2016 1740   LEUKOCYTESUR NEGATIVE 03/17/2016 1740   Sepsis Labs: @LABRCNTIP (procalcitonin:4,lacticidven:4) ) Recent Results (from the past 240 hour(s))  Culture, blood (Routine X 2) w Reflex to ID Panel     Status: None (Preliminary result)   Collection Time: 03/17/16  6:33 PM  Result Value Ref Range Status   Specimen Description BLOOD LEFT ARM  Final   Special Requests BOTTLES DRAWN AEROBIC AND ANAEROBIC 5CC  Final   Culture   Final    NO GROWTH < 24 HOURS Performed at [facility]    Report Status PENDING  Incomplete  Culture, blood (Routine X 2) w Reflex to ID Panel     Status: None (Preliminary result)   Collection Time: 03/17/16  6:33 PM  Result Value Ref Range Status   Specimen Description BLOOD RIGHT WRIST  Final   Special Requests IN PEDIATRIC BOTTLE .5CC  Final   Culture   Final    NO GROWTH < 24 HOURS Performed at [facility]    Report Status PENDING  Incomplete         Radiology Studies: Dg Chest 2 View  Result  Date: 03/17/2016 CLINICAL DATA:  Shortness of breath and cough. EXAM: CHEST  2 VIEW COMPARISON:  12/16/2015 FINDINGS: Lungs are hyperexpanded consistent with emphysema. Right upper lung scarring is again noted.  Stable appearance of peripheral calcified granuloma. Interstitial changes at the bases are likely scarring although there is some increase alveolar opacity at the right base suggesting pneumonia. The cardio pericardial silhouette is enlarged. The visualized bony structures of the thorax are intact. IMPRESSION: Emphysema with chronic interstitial lung disease. New focal airspace opacity at the right base compatible with pneumonia. Electronically Signed   By: Misty Stanley M.D.   On: 03/17/2016 19:52     Scheduled Meds: . atorvastatin  80 mg Oral QHS  . busPIRone  5 mg Oral BID  . cholecalciferol  2,000 Units Oral QPM  . clopidogrel  75 mg Oral Daily  . folic acid  1 mg Oral Daily  . furosemide  20 mg Intravenous Once  . heparin  5,000 Units Subcutaneous Q8H  . pantoprazole  40 mg Oral Daily  . vitamin C  1,000 mg Oral Daily   Continuous Infusions: . sodium chloride 75 mL/hr at 03/19/16 1105     LOS: 2 days    Time spent in minutes: [place], MD [facility] Pager: [email or web address] Password [email or web address] 03/19/2016, 1:06 PM

## 2016-03-20 ENCOUNTER — Inpatient Hospital Stay (HOSPITAL_COMMUNITY): Payer: Medicare Other

## 2016-03-20 DIAGNOSIS — I517 Cardiomegaly: Secondary | ICD-10-CM

## 2016-03-20 LAB — BASIC METABOLIC PANEL
Anion gap: 6 (ref 5–15)
BUN: 9 mg/dL (ref 6–20)
CO2: 29 mmol/L (ref 22–32)
Calcium: 7.9 mg/dL — ABNORMAL LOW (ref 8.9–10.3)
Chloride: 92 mmol/L — ABNORMAL LOW (ref 101–111)
Creatinine, Ser: 0.63 mg/dL (ref 0.44–1.00)
GFR calc Af Amer: >60 mL/min (ref >60)
GFR calc non Af Amer: >60 mL/min (ref >60)
Glucose, Bld: 124 mg/dL — ABNORMAL HIGH (ref 65–99)
Potassium: 3.9 mmol/L (ref 3.5–5.1)
Sodium: 127 mmol/L — ABNORMAL LOW (ref 135–145)

## 2016-03-20 LAB — BRAIN NATRIURETIC PEPTIDE: B Natriuretic Peptide: 1657.4 pg/mL — ABNORMAL HIGH (ref 0.0–100.0)

## 2016-03-20 LAB — ECHOCARDIOGRAM COMPLETE
Height: 63 in
Weight: 2248 oz

## 2016-03-20 MED ORDER — ATENOLOL 50 MG PO TABS
50.0000 mg | ORAL_TABLET | Freq: Every day | ORAL | Status: DC
  Administered 2016-03-20 – 2016-03-24 (×5): 50 mg via ORAL
  Filled 2016-03-20 (×5): qty 1

## 2016-03-20 MED ORDER — PERFLUTREN LIPID MICROSPHERE
1.0000 mL | INTRAVENOUS | Status: AC | PRN
  Administered 2016-03-20: 2 mL via INTRAVENOUS
  Filled 2016-03-20: qty 10

## 2016-03-20 MED ORDER — FUROSEMIDE 10 MG/ML IJ SOLN
20.0000 mg | Freq: Two times a day (BID) | INTRAMUSCULAR | Status: DC
  Administered 2016-03-20 – 2016-03-21 (×2): 20 mg via INTRAVENOUS
  Filled 2016-03-20 (×2): qty 2

## 2016-03-20 NOTE — Progress Notes (Signed)
  Echocardiogram 2D Echocardiogram with Definity has been performed.  Darlina Sicilian M 03/20/2016, 4:05 PM

## 2016-03-20 NOTE — Progress Notes (Signed)
SATURATION QUALIFICATIONS: (This note is used to comply with regulatory documentation for home oxygen)  Patient Saturations on Room Air at Rest = 93%  Patient Saturations on Room Air while Ambulating = 88%  Patient Saturations on  Liters of oxygen while Ambulating = %  Please briefly explain why patient needs home oxygen: Pt O2 oxygen saturation drops 88% when ambulating.

## 2016-03-20 NOTE — Progress Notes (Signed)
PROGRESS NOTE    Helen [name]  [id number] DOB: 17-Jul-1930 DOA: 03/17/2016  PCP: [name], MD   Brief Narrative:  82 y/o with h/o CVA, HTN, HLD, breast cancer presenting for dizziness, daily AM nausea and a low sodium. Sodium 122 in ER.   Subjective: Still dizzy with ambulating. No nausea.   Assessment & Plan:   Principal Problem:   Hyponatremia  - sodium only improved with NS - had extensive work up last admission and no cause was found -U sodium 18, U osm 239, S osm 252  - crackles in RLL are less but becoming hypoxic to 80s on ambulation - CXR>> Atelectasis vs edema in RLL- check ECHO - given a low dose lasix yesterday - sodium dropped further - cont to follow I and O and daily weights   Active Problems: Nausea - states she is eating much more in the hospital- no h/o GERD     Hypercholesteremia - lipitor   CVA and  CAD (coronary artery disease) - Plavix, Lipitor    Leukocytosis - mild- was placed on Levaquin for possible UTI- she did not have symptoms of a UTI- UA negative in ER- follow    Essential hypertension - BP was low but now rising- resume atenolol at 1/2 dose   DVT prophylaxis: heparin Code Status: full code Family Communication: daughter Disposition Plan:  Consultants:   none Procedures:   none Antimicrobials:  Anti-infectives    None       Objective: Vitals:   03/19/16 1000 03/19/16 1420 03/19/16 2136 03/20/16 0500  BP:  (!) 178/62 (!) 187/72 (!) 152/58  Pulse:  94 91 78  Resp:  20 20 19   Temp:  98.6 F (37 C) 98.2 F (36.8 C) 98 F (36.7 C)  TempSrc:  Oral Oral Oral  SpO2: 95% 92% 93% 93%  Weight: 63.7 kg (140 lb 8 oz)     Height: 5\' 3"  (1.6 m)       Intake/Output Summary (Last 24 hours) at 03/20/16 1328 Last data filed at 03/20/16 1315  Gross per 24 hour  Intake              360 ml  Output             2375 ml  Net            -2015 ml   Filed Weights   03/17/16 1617 03/19/16 1000  Weight: 61.9 kg (136  lb 8 oz) 63.7 kg (140 lb 8 oz)    Examination: General exam: Appears comfortable  HEENT: PERRLA, oral mucosa moist, no sclera icterus or thrush Respiratory system: crackles in RLL-  Respiratory effort normal. Cardiovascular system: S1 & S2 heard, RRR.  No murmurs  Gastrointestinal system: Abdomen soft, non-tender, nondistended. Normal bowel sound. No organomegaly Central nervous system: Alert and oriented. No focal neurological deficits. Extremities: No cyanosis, clubbing or edema Skin: No rashes or ulcers Psychiatry:  Mood & affect appropriate.     Data Reviewed: I have personally reviewed following labs and imaging studies  CBC:  Recent Labs Lab 03/17/16 1833 03/19/16 0530  WBC 11.5* 8.3  HGB 9.5* 9.2*  HCT 27.9* 27.5*  MCV 85.1 86.5  PLT 345 [id number]   Basic Metabolic Panel:  Recent Labs Lab 03/17/16 1833  03/18/16 1831 03/18/16 2352 03/19/16 0530 03/19/16 1145 03/20/16 0457  NA 122*  < > 124* 124* 129* 129* 127*  K 4.8  < > 4.5 4.1 3.9 3.7 3.9  CL 87*  < >  92* 94* 99* 96* 92*  CO2 24  < > 24 24 24 25 29   GLUCOSE 120*  < > 153* 123* 113* 210* 124*  BUN 10  < > 11 12 10 10 9   CREATININE 0.71  < > 0.71 0.61 0.54 0.64 0.63  CALCIUM 8.6*  < > 7.7* 7.7* 7.9* 8.0* 7.9*  MG 1.6*  --   --   --   --   --   --   PHOS 3.4  --   --   --   --   --   --   < > = values in this interval not displayed. GFR: Estimated Creatinine Clearance: 47 mL/min (by C-G formula based on SCr of 0.8 mg/dL). Liver Function Tests:  Recent Labs Lab 03/17/16 1833  AST 25  ALT 34  ALKPHOS 81  BILITOT 1.0  PROT 6.4*  ALBUMIN 2.9*   No results for input(s): LIPASE, AMYLASE in the last 168 hours. No results for input(s): AMMONIA in the last 168 hours. Coagulation Profile: No results for input(s): INR, PROTIME in the last 168 hours. Cardiac Enzymes: No results for input(s): CKTOTAL, CKMB, CKMBINDEX, TROPONINI in the last 168 hours. BNP (last 3 results) No results for input(s): PROBNP in  the last 8760 hours. HbA1C: No results for input(s): HGBA1C in the last 72 hours. CBG: No results for input(s): GLUCAP in the last 168 hours. Lipid Profile: No results for input(s): CHOL, HDL, LDLCALC, TRIG, CHOLHDL, LDLDIRECT in the last 72 hours. Thyroid Function Tests: No results for input(s): TSH, T4TOTAL, FREET4, T3FREE, THYROIDAB in the last 72 hours. Anemia Panel: No results for input(s): VITAMINB12, FOLATE, FERRITIN, TIBC, IRON, RETICCTPCT in the last 72 hours. Urine analysis:    Component Value Date/Time   COLORURINE YELLOW 03/17/2016 1740   APPEARANCEUR CLEAR 03/17/2016 1740   LABSPEC 1.009 03/17/2016 1740   PHURINE 7.0 03/17/2016 1740   GLUCOSEU NEGATIVE 03/17/2016 1740   HGBUR NEGATIVE 03/17/2016 1740   BILIRUBINUR NEGATIVE 03/17/2016 1740   KETONESUR NEGATIVE 03/17/2016 1740   PROTEINUR NEGATIVE 03/17/2016 1740   NITRITE NEGATIVE 03/17/2016 1740   LEUKOCYTESUR NEGATIVE 03/17/2016 1740   Sepsis Labs: @LABRCNTIP (procalcitonin:4,lacticidven:4) ) Recent Results (from the past 240 hour(s))  Culture, blood (Routine X 2) w Reflex to ID Panel     Status: None (Preliminary result)   Collection Time: 03/17/16  6:33 PM  Result Value Ref Range Status   Specimen Description BLOOD LEFT ARM  Final   Special Requests BOTTLES DRAWN AEROBIC AND ANAEROBIC 5CC  Final   Culture   Final    NO GROWTH 2 DAYS Performed at [facility]    Report Status PENDING  Incomplete  Culture, blood (Routine X 2) w Reflex to ID Panel     Status: None (Preliminary result)   Collection Time: 03/17/16  6:33 PM  Result Value Ref Range Status   Specimen Description BLOOD RIGHT WRIST  Final   Special Requests IN PEDIATRIC BOTTLE .5CC  Final   Culture   Final    NO GROWTH 2 DAYS Performed at [facility]    Report Status PENDING  Incomplete         Radiology Studies: Dg Chest Port 1 View  Result Date: 03/20/2016 CLINICAL DATA:  Hypoxia. EXAM: PORTABLE CHEST 1 VIEW COMPARISON:   03/17/2016 and 12/14/2015 FINDINGS: Persistent interstitial densities at the right lung base are concerning for an infectious or inflammatory process. Chronic scarring or volume loss in the right lung. Stable calcifications  along the lateral right upper chest. No acute findings in the left lung. Heart and mediastinum are stable. IMPRESSION: Persistent patchy densities at the right lung base are concerning for an infectious or inflammatory process. Minimal change from the recent comparison examination. Chronic lung changes. Electronically Signed   By: Markus Daft M.D.   On: 03/20/2016 12:47     Scheduled Meds: . atenolol  50 mg Oral Daily  . atorvastatin  80 mg Oral QHS  . busPIRone  5 mg Oral BID  . cholecalciferol  2,000 Units Oral QPM  . clopidogrel  75 mg Oral Daily  . folic acid  1 mg Oral Daily  . heparin  5,000 Units Subcutaneous Q8H  . pantoprazole  40 mg Oral Daily  . vitamin C  1,000 mg Oral Daily   Continuous Infusions:     LOS: 3 days    Time spent in minutes: 41    [place], MD [facility] Pager: [email or web address] Password [email or web address] 03/20/2016, 1:28 PM

## 2016-03-21 ENCOUNTER — Inpatient Hospital Stay (HOSPITAL_COMMUNITY): Payer: Medicare Other

## 2016-03-21 LAB — BASIC METABOLIC PANEL
Anion gap: 7 (ref 5–15)
Anion gap: 11 (ref 5–15)
BUN: 9 mg/dL (ref 6–20)
BUN: 9 mg/dL (ref 6–20)
CO2: 29 mmol/L (ref 22–32)
CO2: 29 mmol/L (ref 22–32)
Calcium: 8 mg/dL — ABNORMAL LOW (ref 8.9–10.3)
Calcium: 8.4 mg/dL — ABNORMAL LOW (ref 8.9–10.3)
Chloride: 88 mmol/L — ABNORMAL LOW (ref 101–111)
Chloride: 83 mmol/L — ABNORMAL LOW (ref 101–111)
Creatinine, Ser: 0.5 mg/dL (ref 0.44–1.00)
Creatinine, Ser: 0.68 mg/dL (ref 0.44–1.00)
GFR calc Af Amer: >60 mL/min (ref >60)
GFR calc Af Amer: >60 mL/min (ref >60)
GFR calc non Af Amer: >60 mL/min (ref >60)
GFR calc non Af Amer: >60 mL/min (ref >60)
Glucose, Bld: 126 mg/dL — ABNORMAL HIGH (ref 65–99)
Glucose, Bld: 157 mg/dL — ABNORMAL HIGH (ref 65–99)
Potassium: 4.2 mmol/L (ref 3.5–5.1)
Potassium: 4.4 mmol/L (ref 3.5–5.1)
Sodium: 124 mmol/L — ABNORMAL LOW (ref 135–145)
Sodium: 123 mmol/L — ABNORMAL LOW (ref 135–145)

## 2016-03-21 MED ORDER — SODIUM CHLORIDE 1 G PO TABS
2.0000 g | ORAL_TABLET | Freq: Three times a day (TID) | ORAL | Status: DC
  Administered 2016-03-21 – 2016-03-23 (×5): 2 g via ORAL
  Filled 2016-03-21 (×5): qty 2

## 2016-03-21 MED ORDER — ONDANSETRON 4 MG PO TBDP: 4.0000 mg | ORAL_TABLET | Freq: Four times a day (QID) | ORAL | Status: DC | PRN

## 2016-03-21 MED ORDER — PROCHLORPERAZINE EDISYLATE 5 MG/ML IJ SOLN
10.0000 mg | Freq: Four times a day (QID) | INTRAMUSCULAR | Status: DC | PRN
  Administered 2016-03-21 (×2): 10 mg via INTRAVENOUS
  Filled 2016-03-21 (×2): qty 2

## 2016-03-21 NOTE — Progress Notes (Signed)
PT lost IV site at 1900 and IV team in and replaced it at 2000 - pt is a difficult stick.  First use of new Iv with compazine and IV site infiltrated.  IV team request that I contact MD  to change her nausea meds to PO and leave IV site out for now.  I have contacted MD on call and am awaiting response

## 2016-03-21 NOTE — Progress Notes (Signed)
PROGRESS NOTE    Helen [name]  [id number] DOB: 10/16/1929 DOA: 03/17/2016  PCP: [name], MD   Brief Narrative:  82 y/o with h/o CVA, HTN, HLD, breast cancer presenting for dizziness, daily AM nausea and a low sodium. Sodium 122 in ER.   Subjective: Nausea started again last night. No vomiting. Did not sleep well.   Assessment & Plan:   Principal Problem:   Hyponatremia  - sodium improved with NS but she became fluid overloaded and Lasix was started- Sodium has dropped again- have consulted renal to assist. may need tolvaptan - CXR>> Atelectasis vs edema in RLL-   ECHO shows poor EF and diastolic CHF - cont to follow I and O and daily weights -hypoxic to 80s on ambulation - had extensive work up last admission and no cause was found -U sodium 18, U osm 239, S osm 252   Active Problems: Nausea - appears to get worse when sodium drops below 125  CHF- acute on chronic- systolic and diastolic - ECHO shows EF of 40-4 5%, grade 2 diastolic dysfunction and mod pulm HTN which is a new diagnosis for her - giving Lasix     Hypercholesteremia - lipitor   CVA and  CAD (coronary artery disease) - Plavix, Lipitor    Leukocytosis - mild- was placed on Levaquin for possible UTI- she did not have symptoms of a UTI- UA negative in ER- follow    Essential hypertension - BP was low but now rising- resumed atenolol at 1/2 dose   DVT prophylaxis: heparin Code Status: full code Family Communication: daughter Disposition Plan:  Consultants:   none Procedures:  2 D ECHO 7/28 Study Conclusions  - Left ventricle: The cavity size was normal. There was mild focal   basal hypertrophy of the septum. Systolic function was mildly to   moderately reduced. The estimated ejection fraction was in the   range of 40% to 45%. Global hypokinesis worse in the basal-mid   anteroseptal and inferoseptal myocardium. Features are consistent   with a pseudonormal left ventricular  filling pattern, with   concomitant abnormal relaxation and increased filling pressure   (grade 2 diastolic dysfunction). Doppler parameters are   consistent with high ventricular filling pressure. - Aortic valve: Transvalvular velocity was within the normal range.   There was no stenosis. There was no regurgitation. - Mitral valve: Moderately calcified annulus. There was mild   regurgitation. Valve area by continuity equation (using LVOT   flow): 1.06 cm^2. - Left atrium: The atrium was mildly dilated. - Right ventricle: Systolic function was moderately reduced. - Pulmonary arteries: Systolic pressure was moderately increased.   PA peak pressure: 51 mm Hg (S).  Antimicrobials:  Anti-infectives    None       Objective: Vitals:   03/20/16 0500 03/20/16 1405 03/20/16 2133 03/21/16 0538  BP:    138/72  Pulse: 78 78 88 78  Resp: 19 18 20 20   Temp: 98 F (36.7 C) 97.5 F (36.4 C) 97.9 F (36.6 C) 97.6 F (36.4 C)  TempSrc: Oral Oral Oral Oral  SpO2: 93% 97% 91% 90%  Weight:    63.5 kg (139 lb 15.9 oz)  Height:        Intake/Output Summary (Last 24 hours) at 03/21/16 1200 Last data filed at 03/21/16 0544  Gross per 24 hour  Intake              360 ml  Output  550 ml  Net             -190 ml   Filed Weights   03/17/16 1617 03/19/16 1000 03/21/16 0538  Weight: 61.9 kg (136 lb 8 oz) 63.7 kg (140 lb 8 oz) 63.5 kg (139 lb 15.9 oz)    Examination: General exam: Appears comfortable  HEENT: PERRLA, oral mucosa moist, no sclera icterus or thrush Respiratory system: crackles in RLL-  Respiratory effort normal. Cardiovascular system: S1 & S2 heard, RRR.  No murmurs  Gastrointestinal system: Abdomen soft, non-tender, nondistended. Normal bowel sound. No organomegaly Central nervous system: Alert and oriented. No focal neurological deficits. Extremities: No cyanosis, clubbing or edema Skin: No rashes or ulcers Psychiatry:  Mood & affect appropriate.     Data  Reviewed: I have personally reviewed following labs and imaging studies  CBC:  Recent Labs Lab 03/17/16 1833 03/19/16 0530  WBC 11.5* 8.3  HGB 9.5* 9.2*  HCT 27.9* 27.5*  MCV 85.1 86.5  PLT 345 [id number]   Basic Metabolic Panel:  Recent Labs Lab 03/17/16 1833  03/18/16 2352 03/19/16 0530 03/19/16 1145 03/20/16 0457 03/21/16 0519  NA 122*  < > 124* 129* 129* 127* 124*  K 4.8  < > 4.1 3.9 3.7 3.9 4.2  CL 87*  < > 94* 99* 96* 92* 88*  CO2 24  < > 24 24 25 29 29   GLUCOSE 120*  < > 123* 113* 210* 124* 126*  BUN 10  < > 12 10 10 9 9   CREATININE 0.71  < > 0.61 0.54 0.64 0.63 0.50  CALCIUM 8.6*  < > 7.7* 7.9* 8.0* 7.9* 8.0*  MG 1.6*  --   --   --   --   --   --   PHOS 3.4  --   --   --   --   --   --   < > = values in this interval not displayed. GFR: Estimated Creatinine Clearance: 46.9 mL/min (by C-G formula based on SCr of 0.8 mg/dL). Liver Function Tests:  Recent Labs Lab 03/17/16 1833  AST 25  ALT 34  ALKPHOS 81  BILITOT 1.0  PROT 6.4*  ALBUMIN 2.9*   No results for input(s): LIPASE, AMYLASE in the last 168 hours. No results for input(s): AMMONIA in the last 168 hours. Coagulation Profile: No results for input(s): INR, PROTIME in the last 168 hours. Cardiac Enzymes: No results for input(s): CKTOTAL, CKMB, CKMBINDEX, TROPONINI in the last 168 hours. BNP (last 3 results) No results for input(s): PROBNP in the last 8760 hours. HbA1C: No results for input(s): HGBA1C in the last 72 hours. CBG: No results for input(s): GLUCAP in the last 168 hours. Lipid Profile: No results for input(s): CHOL, HDL, LDLCALC, TRIG, CHOLHDL, LDLDIRECT in the last 72 hours. Thyroid Function Tests: No results for input(s): TSH, T4TOTAL, FREET4, T3FREE, THYROIDAB in the last 72 hours. Anemia Panel: No results for input(s): VITAMINB12, FOLATE, FERRITIN, TIBC, IRON, RETICCTPCT in the last 72 hours. Urine analysis:    Component Value Date/Time   COLORURINE YELLOW 03/17/2016 1740    APPEARANCEUR CLEAR 03/17/2016 1740   LABSPEC 1.009 03/17/2016 1740   PHURINE 7.0 03/17/2016 1740   GLUCOSEU NEGATIVE 03/17/2016 1740   HGBUR NEGATIVE 03/17/2016 1740   BILIRUBINUR NEGATIVE 03/17/2016 1740   KETONESUR NEGATIVE 03/17/2016 1740   PROTEINUR NEGATIVE 03/17/2016 1740   NITRITE NEGATIVE 03/17/2016 1740   LEUKOCYTESUR NEGATIVE 03/17/2016 1740   Sepsis Labs: @LABRCNTIP (procalcitonin:4,lacticidven:4) ) Recent Results (from the  past 240 hour(s))  Culture, blood (Routine X 2) w Reflex to ID Panel     Status: None (Preliminary result)   Collection Time: 03/17/16  6:33 PM  Result Value Ref Range Status   Specimen Description BLOOD LEFT ARM  Final   Special Requests BOTTLES DRAWN AEROBIC AND ANAEROBIC 5CC  Final   Culture   Final    NO GROWTH 3 DAYS Performed at [facility]    Report Status PENDING  Incomplete  Culture, blood (Routine X 2) w Reflex to ID Panel     Status: None (Preliminary result)   Collection Time: 03/17/16  6:33 PM  Result Value Ref Range Status   Specimen Description BLOOD RIGHT WRIST  Final   Special Requests IN PEDIATRIC BOTTLE .5CC  Final   Culture   Final    NO GROWTH 3 DAYS Performed at [facility]    Report Status PENDING  Incomplete         Radiology Studies: Dg Chest Port 1 View  Result Date: 03/20/2016 CLINICAL DATA:  Hypoxia. EXAM: PORTABLE CHEST 1 VIEW COMPARISON:  03/17/2016 and 12/14/2015 FINDINGS: Persistent interstitial densities at the right lung base are concerning for an infectious or inflammatory process. Chronic scarring or volume loss in the right lung. Stable calcifications along the lateral right upper chest. No acute findings in the left lung. Heart and mediastinum are stable. IMPRESSION: Persistent patchy densities at the right lung base are concerning for an infectious or inflammatory process. Minimal change from the recent comparison examination. Chronic lung changes. Electronically Signed   By: Markus Daft  M.D.   On: 03/20/2016 12:47     Scheduled Meds: . atenolol  50 mg Oral Daily  . atorvastatin  80 mg Oral QHS  . busPIRone  5 mg Oral BID  . cholecalciferol  2,000 Units Oral QPM  . clopidogrel  75 mg Oral Daily  . folic acid  1 mg Oral Daily  . furosemide  20 mg Intravenous BID  . heparin  5,000 Units Subcutaneous Q8H  . pantoprazole  40 mg Oral Daily  . vitamin C  1,000 mg Oral Daily   Continuous Infusions:     LOS: 4 days    Time spent in minutes: 98    [place], MD [facility] Pager: [email or web address] Password [email or web address] 03/21/2016, 12:00 PM

## 2016-03-21 NOTE — Consult Note (Signed)
Renal Service Consult Note [facility]  Helen [name] 03/21/2016 [place] Requesting Physician:  Dr. Wynelle Cleveland  Reason for Consult:  Hyponatremia HPI: The patient is a 82 y.o. year-old presented to ED with nausea, dizziness and anorexia.  Symptoms off and on for last 1-2 mos.  In ED Na was 118. She was given IVF's for the first 3 days.  Then she developed hypoxemia and new CXR showed RLL densities which was felt to represent fluid vs infection.  IVF was stopped and IV lasix started.  Na worsened from up at 129 to down to 124.  We were asked to see pt for stoneisisi   Was admitted here for 5 days in April 2017 with AMS and loss of balance, Na 115. Known hx carotid stenosis. Rx for UTI.  Zoloft/ ACEi and Depakote held for concerns about hypoNa+.  Na improved steadily from 115 to 134 on dc after 7 d in the hosptial.  Uosm was ~450 and UNa was 18 on admit/   Here in 2008 w R elbow dislocation, DM/ HTN, HL, GERD, depress/ anxiety and chronic pain.    ROS  denies CP  no joint pain   no HA  no blurry vision  no rash  no diarrhea  no nausea/ vomiting  no dysuria  no difficulty voiding  no change in urine color    Past Medical History  Past Medical History:  Diagnosis Date  . Anemia   . CAD (coronary artery disease)   . Cancer [facility])    Breast cancer  . Diabetes mellitus   . Hypercholesteremia   . Hypertension   . Peripheral vascular disease ([place])   . Stroke [facility])    reports TIA 3-4 weeks ago   Past Surgical History  Past Surgical History:  Procedure Laterality Date  . ABDOMINAL HYSTERECTOMY    . APPENDECTOMY  1951  . [place]  . CHOLECYSTECTOMY  2003   By Dr. Excell Seltzer  . EYE SURGERY Bilateral 2009   Cataract surgery  . FRACTURE SURGERY Right    ORIF   by Dr. Burney Gauze  . HUMERUS FRACTURE SURGERY    . MASTECTOMY Bilateral   . NASAL SEPTUM SURGERY  1977   Dr. Ernesto Rutherford  . TOENAIL EXCISION     Family History  Family History   Problem Relation Age of Onset  . Cancer Mother   . Heart disease Father   . Heart attack Father   . Diabetes Brother   . Heart disease Brother   . Hyperlipidemia Brother    Social History  reports that she quit smoking about 37 years ago. She has never used smokeless tobacco. She reports that she does not drink alcohol or use drugs. Allergies  Allergies  Allergen Reactions  . Morphine And Related Nausea And Vomiting  . Other Other (See Comments)    Barley & Carrots: learned through allergy testing. Unknown reaction  . Sulfa Antibiotics Hives  . Nickel Rash   Home medications Prior to Admission medications   Medication Sig Start Date End Date Taking? Authorizing Provider  acetaminophen (TYLENOL) 500 MG tablet Take 1,000 mg by mouth every 6 (six) hours as needed for moderate pain or headache.   Yes Historical Provider, MD  atenolol (TENORMIN) 50 MG tablet Take 100 mg by mouth daily.    Yes Historical Provider, MD  atorvastatin (LIPITOR) 80 MG tablet Take 80 mg by mouth at bedtime.    Yes Historical Provider, MD  BIOTIN  PO Take 1 tablet by mouth daily.   Yes Historical Provider, MD  bismuth subsalicylate (PEPTO BISMOL) 262 MG/15ML suspension Take 30 mLs by mouth every 6 (six) hours as needed (nausea).    Yes Historical Provider, MD  busPIRone (BUSPAR) 5 MG tablet Take 1 tablet (5 mg total) by mouth 2 (two) times daily. 12/18/15  Yes Reyne Dumas, MD  cholecalciferol (VITAMIN D) 1000 units tablet Take 2,000 Units by mouth every evening.   Yes Historical Provider, MD  clopidogrel (PLAVIX) 75 MG tablet Take 75 mg by mouth daily.   Yes Historical Provider, MD  FOLIC ACID PO Take 1 tablet by mouth daily.   Yes Historical Provider, MD  levofloxacin (LEVAQUIN) 500 MG tablet Take 1 tablet (500 mg total) by mouth daily. 12/18/15  Yes Reyne Dumas, MD  Melatonin 5 MG TABS Take 10 mg by mouth at bedtime as needed (sleep).   Yes Historical Provider, MD  Multiple Vitamin (MULTIVITAMIN WITH MINERALS)  TABS tablet Take 1 tablet by mouth daily.    Yes Historical Provider, MD  omeprazole (PRILOSEC) 20 MG capsule Take 20 mg by mouth every morning.    Yes Historical Provider, MD  ramipril (ALTACE) 10 MG capsule Take 20 mg by mouth daily.    Yes Historical Provider, MD  vitamin C (ASCORBIC ACID) 500 MG tablet Take 1,000 mg by mouth daily.   Yes Historical Provider, MD   Liver Function Tests  Recent Labs Lab 03/17/16 1833  AST 25  ALT 34  ALKPHOS 81  BILITOT 1.0  PROT 6.4*  ALBUMIN 2.9*   No results for input(s): LIPASE, AMYLASE in the last 168 hours. CBC  Recent Labs Lab 03/17/16 1833 03/19/16 0530  WBC 11.5* 8.3  HGB 9.5* 9.2*  HCT 27.9* 27.5*  MCV 85.1 86.5  PLT 345 [id number]   Basic Metabolic Panel  Recent Labs Lab 03/17/16 1833  03/18/16 1156 03/18/16 1831 03/18/16 2352 03/19/16 0530 03/19/16 1145 03/20/16 0457 03/21/16 0519  NA 122*  < > 124* 124* 124* 129* 129* 127* 124*  K 4.8  < > 3.9 4.5 4.1 3.9 3.7 3.9 4.2  CL 87*  < > 92* 92* 94* 99* 96* 92* 88*  CO2 24  < > 23 24 24 24 25 29 29   GLUCOSE 120*  < > 238* 153* 123* 113* 210* 124* 126*  BUN 10  < > 10 11 12 10 10 9 9   CREATININE 0.71  < > 0.62 0.71 0.61 0.54 0.64 0.63 0.50  CALCIUM 8.6*  < > 7.7* 7.7* 7.7* 7.9* 8.0* 7.9* 8.0*  PHOS 3.4  --   --   --   --   --   --   --   --   < > = values in this interval not displayed. Iron/TIBC/Ferritin/ %Sat No results found for: IRON, TIBC, FERRITIN, IRONPCTSAT  Vitals:   03/20/16 0500 03/20/16 1405 03/20/16 2133 03/21/16 0538  BP:    138/72  Pulse: 78 78 88 78  Resp: 19 18 20 20   Temp: 98 F (36.7 C) 97.5 F (36.4 C) 97.9 F (36.6 C) 97.6 F (36.4 C)  TempSrc: Oral Oral Oral Oral  SpO2: 93% 97% 91% 90%  Weight:    63.5 kg (139 lb 15.9 oz)  Height:       Exam Gen Alert elderly WF no distress No rash, cyanosis or gangrene Sclera anicteric, throat clear  No jvd or bruits Chest rales R base, L clear, bilat mastectomy scars RRR no  MRG Abd soft ntnd no mass or  ascites +bs GU defer MS no joint effusions or deformity Ext no LE or UE edema / no wounds or ulcers Neuro is alert, Ox 3 , nf  Na 124   CO2 29  BUN 9  Creat 0.50  BNP 1667 ^^    ECHO  EF 40-45%  G2DD  Assessment: 1.  Hyponatremia - in April Na+ improved w holding of SSRI and a couple other medications.  Now hypoNa+ again.  Uosm is lowish end of normal, UNa not helpful but not low.  Not grossly vol overloaded but hypoxemic episode recently prompts need to r/o pulm edema.  Plan is chest CT noncontrast and reassess after results back.  Suspect she has low solute , but could have decomp CHF as well.   2.  CM EF 40-45% 3.  Essential HTN, holding acei, appropriate as wouldn't want ACEi's in hypoNa+ patients 4.  Breast Ca sp double mastect 2008 5.   DM2  6.   Anxiety on Buspar   Plan - get chest CT, eval for signs of vol overload / PE/ etc.      Kelly Splinter MD [place] pager [phone]    cell [phone] 03/21/2016, 12:32 PM

## 2016-03-21 NOTE — Progress Notes (Signed)
Patient having nausea throughout the night and dizziness. Zofran given this AM. Vitals WNL. Will report to day RN.

## 2016-03-22 DIAGNOSIS — I2510 Atherosclerotic heart disease of native coronary artery without angina pectoris: Secondary | ICD-10-CM

## 2016-03-22 LAB — BASIC METABOLIC PANEL
Anion gap: 8 (ref 5–15)
Anion gap: 8 (ref 5–15)
BUN: 9 mg/dL (ref 6–20)
BUN: 11 mg/dL (ref 6–20)
CO2: 30 mmol/L (ref 22–32)
CO2: 29 mmol/L (ref 22–32)
Calcium: 8.4 mg/dL — ABNORMAL LOW (ref 8.9–10.3)
Calcium: 8.2 mg/dL — ABNORMAL LOW (ref 8.9–10.3)
Chloride: 87 mmol/L — ABNORMAL LOW (ref 101–111)
Chloride: 91 mmol/L — ABNORMAL LOW (ref 101–111)
Creatinine, Ser: 0.69 mg/dL (ref 0.44–1.00)
Creatinine, Ser: 0.66 mg/dL (ref 0.44–1.00)
GFR calc Af Amer: >60 mL/min (ref >60)
GFR calc Af Amer: >60 mL/min (ref >60)
GFR calc non Af Amer: >60 mL/min (ref >60)
GFR calc non Af Amer: >60 mL/min (ref >60)
Glucose, Bld: 130 mg/dL — ABNORMAL HIGH (ref 65–99)
Glucose, Bld: 126 mg/dL — ABNORMAL HIGH (ref 65–99)
Potassium: 4.1 mmol/L (ref 3.5–5.1)
Potassium: 3.9 mmol/L (ref 3.5–5.1)
Sodium: 125 mmol/L — ABNORMAL LOW (ref 135–145)
Sodium: 128 mmol/L — ABNORMAL LOW (ref 135–145)

## 2016-03-22 LAB — RETICULOCYTES
RBC.: 3.31 MIL/uL — ABNORMAL LOW (ref 3.87–5.11)
Retic Count, Absolute: 122.5 10*3/uL (ref 19.0–186.0)
Retic Ct Pct: 3.7 % — ABNORMAL HIGH (ref 0.4–3.1)

## 2016-03-22 LAB — CBC
HCT: 28.7 % — ABNORMAL LOW (ref 36.0–46.0)
Hemoglobin: 9.5 g/dL — ABNORMAL LOW (ref 12.0–15.0)
MCH: 28.7 pg (ref 26.0–34.0)
MCHC: 33.1 g/dL (ref 30.0–36.0)
MCV: 86.7 fL (ref 78.0–100.0)
Platelets: 386 10*3/uL (ref 150–400)
RBC: 3.31 MIL/uL — ABNORMAL LOW (ref 3.87–5.11)
RDW: 15 % (ref 11.5–15.5)
WBC: 10.2 10*3/uL (ref 4.0–10.5)

## 2016-03-22 LAB — CULTURE, BLOOD (ROUTINE X 2)
Culture: NO GROWTH
Culture: NO GROWTH

## 2016-03-22 LAB — IRON AND TIBC
Iron: 27 ug/dL — ABNORMAL LOW (ref 28–170)
Saturation Ratios: 10 % — ABNORMAL LOW (ref 10.4–31.8)
TIBC: 277 ug/dL (ref 250–450)
UIBC: 250 ug/dL

## 2016-03-22 LAB — FOLATE: Folate: 31.3 ng/mL (ref >5.9)

## 2016-03-22 LAB — VITAMIN B12: Vitamin B-12: 603 pg/mL (ref 180–914)

## 2016-03-22 LAB — FERRITIN: Ferritin: 331 ng/mL — ABNORMAL HIGH (ref 11–307)

## 2016-03-22 NOTE — Progress Notes (Signed)
  [facility] Progress Note   Subjective: feeling better, Na up 135, CT chest showing chronic scarring no fluid overload  Vitals:   03/21/16 2300 03/22/16 0521 03/22/16 1109 03/22/16 1432  BP:  121/61 (!) 129/56 (!) 128/53  Pulse:  (!) 55 91 71  Resp:  20    Temp:  98.1 F (36.7 C)  98.2 F (36.8 C)  TempSrc:  Oral  Oral  SpO2: 93% 93% 94% 97%  Weight:  62.2 kg (137 lb 3.2 oz)    Height:        Inpatient medications: . atenolol  50 mg Oral Daily  . atorvastatin  80 mg Oral QHS  . busPIRone  5 mg Oral BID  . cholecalciferol  2,000 Units Oral QPM  . clopidogrel  75 mg Oral Daily  . folic acid  1 mg Oral Daily  . heparin  5,000 Units Subcutaneous Q8H  . pantoprazole  40 mg Oral Daily  . sodium chloride  2 g Oral TID WC  . vitamin C  1,000 mg Oral Daily     acetaminophen, ondansetron (ZOFRAN) IV, ondansetron, polyethylene glycol, prochlorperazine  Exam: Gen Alert elderly WF no distress No rash, cyanosis or gangrene Sclera anicteric, throat clear  No jvd or bruits Chest rales R base, L clear, bilat mastectomy scars RRR no MRG Abd soft ntnd no mass or ascites +bs GU defer MS no joint effusions or deformity Ext no LE or UE edema / no wounds or ulcers Neuro is alert, Ox 3 , nf  Na 124   CO2 29  BUN 9  Creat 0.50  BNP 1667 ^^    ECHO  EF 40-45%  G2DD  Assessment: 1.  Hyponatremia - recurrent, was here in April w same treated w holding SSRI.   CM EF 40-45% but euvolemic, no vol excess, low urine osm. CT chest wo vol excess, will treat as low solute given poor po intake, starting NaCl tabs and fluid restriction 3.  HTN - would dc acei and avoid ace/  ARB in this patient 4.  Breast Ca sp double mastect 2008 5.   DM2  6.   Anxiety on Buspar  Plan - as above   Kelly Splinter MD [facility] pager [phone]    cell [phone] 03/22/2016, 3:43 PM    Recent Labs Lab 03/17/16 1833  03/21/16 0519 03/21/16 1557 03/22/16 0404  NA 122*  <  > 124* 123* 125*  K 4.8  < > 4.2 4.4 4.1  CL 87*  < > 88* 83* 87*  CO2 24  < > 29 29 30   GLUCOSE 120*  < > 126* 157* 130*  BUN 10  < > 9 9 9   CREATININE 0.71  < > 0.50 0.68 0.69  CALCIUM 8.6*  < > 8.0* 8.4* 8.4*  PHOS 3.4  --   --   --   --   < > = values in this interval not displayed.  Recent Labs Lab 03/17/16 1833  AST 25  ALT 34  ALKPHOS 81  BILITOT 1.0  PROT 6.4*  ALBUMIN 2.9*    Recent Labs Lab 03/17/16 1833 03/19/16 0530  WBC 11.5* 8.3  HGB 9.5* 9.2*  HCT 27.9* 27.5*  MCV 85.1 86.5  PLT 345 334   Iron/TIBC/Ferritin/ %Sat No results found for: IRON, TIBC, FERRITIN, IRONPCTSAT

## 2016-03-22 NOTE — Progress Notes (Signed)
Patient unable to ambulate in hall.  Reports dizziness upon ambulating to bathroom and back to bed.  Patient used incentive spirometer upon returning back to bed and feeling better.

## 2016-03-22 NOTE — Progress Notes (Signed)
PROGRESS NOTE    Helen [name]  [id number] DOB: Nov 13, 1929 DOA: 03/17/2016  PCP: [name], MD   Brief Narrative:  82 y/o with h/o CVA, HTN, HLD, breast cancer presenting for dizziness, daily AM nausea and a low sodium. Sodium 122 in ER.   Subjective: No nausea or dizziness today.   Assessment & Plan:   Principal Problem:   Hyponatremia  - sodium improved with NS but she became fluid overloaded and Lasix was started- Sodium subsequently dropped again- have consulted renal to assist. - they have started fluid restriction and sodium tabs - CXR>> Atelectasis vs edema in RLL-   ECHO shows poor EF and diastolic CHF - CT shows RLL scarring likely from prior radiation -hypoxic to 80s on ambulation- started IS - had extensive work up last admission and no cause was found -U sodium 18, U osm 239, S osm 252 on admission - cont to follow I and O and daily weights  Active Problems: Nausea - appears to get worse when sodium drops below 125  CHF- acute on chronic- systolic and diastolic - ECHO shows EF of 40-4 5%, grade 2 diastolic dysfunction and mod pulm HTN which is a new diagnosis for her - given Lasix but sodium dropped- currently on fluid restriction      Hypercholesteremia - lipitor   CVA and  CAD (coronary artery disease) - Plavix, Lipitor    Leukocytosis - mild- was placed on Levaquin for possible UTI- she did not have symptoms of a UTI- UA negative in ER- follow    Essential hypertension - BP was low but now rising- resumed atenolol at 1/2 dose   DVT prophylaxis: heparin Code Status: full code Family Communication: daughter Disposition Plan:  Consultants:   none Procedures:  2 D ECHO 7/28 Study Conclusions  - Left ventricle: The cavity size was normal. There was mild focal   basal hypertrophy of the septum. Systolic function was mildly to   moderately reduced. The estimated ejection fraction was in the   range of 40% to 45%. Global hypokinesis  worse in the basal-mid   anteroseptal and inferoseptal myocardium. Features are consistent   with a pseudonormal left ventricular filling pattern, with   concomitant abnormal relaxation and increased filling pressure   (grade 2 diastolic dysfunction). Doppler parameters are   consistent with high ventricular filling pressure. - Aortic valve: Transvalvular velocity was within the normal range.   There was no stenosis. There was no regurgitation. - Mitral valve: Moderately calcified annulus. There was mild   regurgitation. Valve area by continuity equation (using LVOT   flow): 1.06 cm^2. - Left atrium: The atrium was mildly dilated. - Right ventricle: Systolic function was moderately reduced. - Pulmonary arteries: Systolic pressure was moderately increased.   PA peak pressure: 51 mm Hg (S).  Antimicrobials:  Anti-infectives    None       Objective: Vitals:   03/21/16 0538 03/21/16 2212 03/21/16 2300 03/22/16 0521  BP: 138/72 (!) 102/59  121/61  Pulse: 78 (!) 56  (!) 55  Resp: 20 20  20   Temp: 97.6 F (36.4 C) 98.7 F (37.1 C)  98.1 F (36.7 C)  TempSrc: Oral Oral  Oral  SpO2: 90% 90% 93% 93%  Weight: 63.5 kg (139 lb 15.9 oz)   62.2 kg (137 lb 3.2 oz)  Height:        Intake/Output Summary (Last 24 hours) at 03/22/16 1049 Last data filed at 03/22/16 0807  Gross per 24 hour  Intake              300 ml  Output             1250 ml  Net             -950 ml   Filed Weights   03/19/16 1000 03/21/16 0538 03/22/16 0521  Weight: 63.7 kg (140 lb 8 oz) 63.5 kg (139 lb 15.9 oz) 62.2 kg (137 lb 3.2 oz)    Examination: General exam: Appears comfortable  HEENT: PERRLA, oral mucosa moist, no sclera icterus or thrush Respiratory system: crackles in RLL-  Respiratory effort normal. Cardiovascular system: S1 & S2 heard, RRR.  No murmurs  Gastrointestinal system: Abdomen soft, non-tender, nondistended. Normal bowel sound. No organomegaly Central nervous system: Alert and oriented.  No focal neurological deficits. Extremities: No cyanosis, clubbing or edema Skin: No rashes or ulcers Psychiatry:  Mood & affect appropriate.     Data Reviewed: I have personally reviewed following labs and imaging studies  CBC:  Recent Labs Lab 03/17/16 1833 03/19/16 0530  WBC 11.5* 8.3  HGB 9.5* 9.2*  HCT 27.9* 27.5*  MCV 85.1 86.5  PLT 345 [id number]   Basic Metabolic Panel:  Recent Labs Lab 03/17/16 1833  03/19/16 1145 03/20/16 0457 03/21/16 0519 03/21/16 1557 03/22/16 0404  NA 122*  < > 129* 127* 124* 123* 125*  K 4.8  < > 3.7 3.9 4.2 4.4 4.1  CL 87*  < > 96* 92* 88* 83* 87*  CO2 24  < > 25 29 29 29 30   GLUCOSE 120*  < > 210* 124* 126* 157* 130*  BUN 10  < > 10 9 9 9 9   CREATININE 0.71  < > 0.64 0.63 0.50 0.68 0.69  CALCIUM 8.6*  < > 8.0* 7.9* 8.0* 8.4* 8.4*  MG 1.6*  --   --   --   --   --   --   PHOS 3.4  --   --   --   --   --   --   < > = values in this interval not displayed. GFR: Estimated Creatinine Clearance: 43.3 mL/min (by C-G formula based on SCr of 0.8 mg/dL). Liver Function Tests:  Recent Labs Lab 03/17/16 1833  AST 25  ALT 34  ALKPHOS 81  BILITOT 1.0  PROT 6.4*  ALBUMIN 2.9*   No results for input(s): LIPASE, AMYLASE in the last 168 hours. No results for input(s): AMMONIA in the last 168 hours. Coagulation Profile: No results for input(s): INR, PROTIME in the last 168 hours. Cardiac Enzymes: No results for input(s): CKTOTAL, CKMB, CKMBINDEX, TROPONINI in the last 168 hours. BNP (last 3 results) No results for input(s): PROBNP in the last 8760 hours. HbA1C: No results for input(s): HGBA1C in the last 72 hours. CBG: No results for input(s): GLUCAP in the last 168 hours. Lipid Profile: No results for input(s): CHOL, HDL, LDLCALC, TRIG, CHOLHDL, LDLDIRECT in the last 72 hours. Thyroid Function Tests: No results for input(s): TSH, T4TOTAL, FREET4, T3FREE, THYROIDAB in the last 72 hours. Anemia Panel: No results for input(s): VITAMINB12,  FOLATE, FERRITIN, TIBC, IRON, RETICCTPCT in the last 72 hours. Urine analysis:    Component Value Date/Time   COLORURINE YELLOW 03/17/2016 [place] 03/17/2016 1740   LABSPEC 1.009 03/17/2016 1740   PHURINE 7.0 03/17/2016 1740   GLUCOSEU NEGATIVE 03/17/2016 1740   HGBUR NEGATIVE 03/17/2016 1740   BILIRUBINUR NEGATIVE 03/17/2016 1740   KETONESUR NEGATIVE  03/17/2016 1740   PROTEINUR NEGATIVE 03/17/2016 1740   NITRITE NEGATIVE 03/17/2016 1740   LEUKOCYTESUR NEGATIVE 03/17/2016 1740   Sepsis Labs: @LABRCNTIP (procalcitonin:4,lacticidven:4) ) Recent Results (from the past 240 hour(s))  Culture, blood (Routine X 2) w Reflex to ID Panel     Status: None (Preliminary result)   Collection Time: 03/17/16  6:33 PM  Result Value Ref Range Status   Specimen Description BLOOD LEFT ARM  Final   Special Requests BOTTLES DRAWN AEROBIC AND ANAEROBIC 5CC  Final   Culture   Final    NO GROWTH 4 DAYS Performed at [facility]    Report Status PENDING  Incomplete  Culture, blood (Routine X 2) w Reflex to ID Panel     Status: None (Preliminary result)   Collection Time: 03/17/16  6:33 PM  Result Value Ref Range Status   Specimen Description BLOOD RIGHT WRIST  Final   Special Requests IN PEDIATRIC BOTTLE .5CC  Final   Culture   Final    NO GROWTH 4 DAYS Performed at [facility]    Report Status PENDING  Incomplete         Radiology Studies: Ct Chest Wo Contrast  Result Date: 03/21/2016 CLINICAL DATA:  82 year old female with shortness of breath and cough. EXAM: CT CHEST WITHOUT CONTRAST TECHNIQUE: Multidetector CT imaging of the chest was performed following the standard protocol without IV contrast. COMPARISON:  03/20/2016 and prior chest radiographs FINDINGS: Cardiovascular: Cardiomegaly, moderate coronary artery calcifications and aortic valvular calcifications noted. Thoracic aortic atherosclerotic calcifications noted without aneurysm. Mediastinum/Nodes: No  mediastinal mass. Shotty mediastinal and bilateral hilar lymph nodes are noted. There is no evidence of pericardial effusion. The patient is status post right mastectomy. Lungs/Pleura: Right hemithorax volume loss noted. There are patchy confluent opacities within the visualized right lung, greatest posteriorly, some which represent chronic changes/atelectasis/posttreatment changes but concurrent infection is not excluded. Mild patchy ground-glass/airspace opacities within the posterior left lower lobe identified and may represent infection or aspiration. There is no discrete pulmonary mass identified. A small to moderate right pleural effusion and trace left pleural effusion noted. There is no evidence of pneumothorax. Upper Abdomen: A small hiatal hernia is noted. Musculoskeletal: No acute or suspicious abnormalities. IMPRESSION: Patchy confluent opacities within the visualized right lung, some which represent chronic/posttreatment changes or atelectasis. Superimposed infection/pneumonia is not excluded. Mild patchy ground-glass/airspace opacities within the posterior lower lobe which may represent pneumonia or aspiration. Small moderate right pleural effusion and trace left pleural effusion. Cardiomegaly, coronary disease and aortic valvular calcifications. Small hiatal hernia. Electronically Signed   By: Margarette Canada M.D.   On: 03/21/2016 15:51  Dg Chest Port 1 View  Result Date: 03/20/2016 CLINICAL DATA:  Hypoxia. EXAM: PORTABLE CHEST 1 VIEW COMPARISON:  03/17/2016 and 12/14/2015 FINDINGS: Persistent interstitial densities at the right lung base are concerning for an infectious or inflammatory process. Chronic scarring or volume loss in the right lung. Stable calcifications along the lateral right upper chest. No acute findings in the left lung. Heart and mediastinum are stable. IMPRESSION: Persistent patchy densities at the right lung base are concerning for an infectious or inflammatory process. Minimal  change from the recent comparison examination. Chronic lung changes. Electronically Signed   By: Markus Daft M.D.   On: 03/20/2016 12:47     Scheduled Meds: . atenolol  50 mg Oral Daily  . atorvastatin  80 mg Oral QHS  . busPIRone  5 mg Oral BID  . cholecalciferol  2,000 Units Oral  QPM  . clopidogrel  75 mg Oral Daily  . folic acid  1 mg Oral Daily  . heparin  5,000 Units Subcutaneous Q8H  . pantoprazole  40 mg Oral Daily  . sodium chloride  2 g Oral TID WC  . vitamin C  1,000 mg Oral Daily   Continuous Infusions:     LOS: 5 days    Time spent in minutes: 59    [place], MD [facility] Pager: [email or web address] Password [email or web address] 03/22/2016, 10:49 AM

## 2016-03-22 NOTE — Progress Notes (Signed)
Ambulated patient to bathroom.  Patient again reports dizziness.  Upon returning to bed, patient reports feeling better.  Patient unable to ambulate in hall at this time.

## 2016-03-22 NOTE — Progress Notes (Signed)
Prior to walking pt sats on RA have been 91 to 97% mostly lower 90s.  Continues to report occassional dizziness in bed after moving around in bed/blowing nose and always when oob to BR.  Pt walked in hall and sats range from 83 to 91 mostly 88%.  Pt describes sensation while walking was "head tightness" and slightly SOB and only mild dizziness while walking .  Returns to bed and sats on RA 92%.

## 2016-03-23 LAB — BASIC METABOLIC PANEL
Anion gap: 7 (ref 5–15)
Anion gap: 7 (ref 5–15)
BUN: 10 mg/dL (ref 6–20)
BUN: 11 mg/dL (ref 6–20)
CO2: 29 mmol/L (ref 22–32)
CO2: 30 mmol/L (ref 22–32)
Calcium: 8.3 mg/dL — ABNORMAL LOW (ref 8.9–10.3)
Calcium: 8.1 mg/dL — ABNORMAL LOW (ref 8.9–10.3)
Chloride: 93 mmol/L — ABNORMAL LOW (ref 101–111)
Chloride: 93 mmol/L — ABNORMAL LOW (ref 101–111)
Creatinine, Ser: 0.67 mg/dL (ref 0.44–1.00)
Creatinine, Ser: 0.73 mg/dL (ref 0.44–1.00)
GFR calc Af Amer: >60 mL/min (ref >60)
GFR calc Af Amer: >60 mL/min (ref >60)
GFR calc non Af Amer: >60 mL/min (ref >60)
GFR calc non Af Amer: >60 mL/min (ref >60)
Glucose, Bld: 115 mg/dL — ABNORMAL HIGH (ref 65–99)
Glucose, Bld: 157 mg/dL — ABNORMAL HIGH (ref 65–99)
Potassium: 3.7 mmol/L (ref 3.5–5.1)
Potassium: 4.4 mmol/L (ref 3.5–5.1)
Sodium: 129 mmol/L — ABNORMAL LOW (ref 135–145)
Sodium: 130 mmol/L — ABNORMAL LOW (ref 135–145)

## 2016-03-23 MED ORDER — FUROSEMIDE 20 MG PO TABS
10.0000 mg | ORAL_TABLET | Freq: Every day | ORAL | Status: DC
  Administered 2016-03-23: 10 mg via ORAL
  Filled 2016-03-23: qty 1

## 2016-03-23 NOTE — Progress Notes (Signed)
[facility] Progress Note   Subjective: sodium trending up slowly to 129 today - she has no complaints- is eating well   Vitals:   03/22/16 1109 03/22/16 1432 03/22/16 2302 03/23/16 0602  BP: (!) 129/56 (!) 128/53 (!) 143/70 (!) 110/53  Pulse: 91 71 71 (!) 53  Resp:   20 18  Temp:  98.2 F (36.8 C) 98.9 F (37.2 C) 97.5 F (36.4 C)  TempSrc:  Oral Oral Oral  SpO2: 94% 97% 95% 93%  Weight:    62.1 kg (136 lb 14.4 oz)  Height:        Inpatient medications: . atenolol  50 mg Oral Daily  . atorvastatin  80 mg Oral QHS  . busPIRone  5 mg Oral BID  . cholecalciferol  2,000 Units Oral QPM  . clopidogrel  75 mg Oral Daily  . folic acid  1 mg Oral Daily  . heparin  5,000 Units Subcutaneous Q8H  . pantoprazole  40 mg Oral Daily  . sodium chloride  2 g Oral TID WC  . vitamin C  1,000 mg Oral Daily     acetaminophen, ondansetron (ZOFRAN) IV, ondansetron, polyethylene glycol, prochlorperazine  Exam: Gen Alert elderly WF no distress No rash, cyanosis or gangrene Sclera anicteric, throat clear  No jvd or bruits Chest rales R base, L clear, bilat mastectomy scars RRR no MRG Abd soft ntnd no mass or ascites +bs GU defer MS no joint effusions or deformity Ext no LE or UE edema / no wounds or ulcers Neuro is alert, Ox 3 , nf  Na 124   CO2 29  BUN 9  Creat 0.50  BNP 1667 ^^    ECHO  EF 40-45%  G2DD  Assessment: 1.  Hyponatremia - recurrent, was here in April w same treated w holding SSRI.   CM EF 40-45% but euvolemic, no obvious vol excess, low urine osm. CT chest wo vol excess, will treat as low solute given poor po intake, - she says she is eating better so I will stop salt tabs- place on very low dose lasix just to keep urine dilute- agree with fluid restriction 2.  HTN - would dc acei and avoid ace/  ARB in this patient 3.  Breast Ca sp double mastect 2008 4.   DM2  5.   Anxiety on Buspar 6. Dispo- I would think that once sodium gets over 130 she could be  cleared for discharge      Shaquelle Hernon A  03/23/2016, 11:50 AM    Recent Labs Lab 03/17/16 1833  03/22/16 0404 03/22/16 1536 03/23/16 0458  NA 122*  < > 125* 128* 129*  K 4.8  < > 4.1 3.9 3.7  CL 87*  < > 87* 91* 93*  CO2 24  < > 30 29 29   GLUCOSE 120*  < > 130* 126* 115*  BUN 10  < > 9 11 10   CREATININE 0.71  < > 0.69 0.66 0.67  CALCIUM 8.6*  < > 8.4* 8.2* 8.3*  PHOS 3.4  --   --   --   --   < > = values in this interval not displayed.  Recent Labs Lab 03/17/16 1833  AST 25  ALT 34  ALKPHOS 81  BILITOT 1.0  PROT 6.4*  ALBUMIN 2.9*    Recent Labs Lab 03/17/16 1833 03/19/16 0530 03/22/16 1536  WBC 11.5* 8.3 10.2  HGB 9.5* 9.2* 9.5*  HCT 27.9* 27.5* 28.7*  MCV 85.1 86.5  86.7  PLT 345 334 386   Iron/TIBC/Ferritin/ %Sat    Component Value Date/Time   IRON 27 (L) 03/22/2016 1536   TIBC 277 03/22/2016 1536   FERRITIN 331 (H) 03/22/2016 1536   IRONPCTSAT 10 (L) 03/22/2016 1536

## 2016-03-23 NOTE — Care Management Important Message (Signed)
Important Message  Patient Details  Name: Helen [name] MRN: [id number] Date of Birth: 1929-10-20   Medicare Important Message Given:  Yes    Camillo Flaming 03/23/2016, 11:02 AMImportant Message  Patient Details  Name: Helen [name] MRN: [id number] Date of Birth: 14-Jul-1930   Medicare Important Message Given:  Yes    Camillo Flaming 03/23/2016, 11:02 AM

## 2016-03-23 NOTE — Progress Notes (Signed)
PROGRESS NOTE    Helen [name]  [id number] DOB: [birth year] DOA: 03/17/2016  PCP: [name], MD   Brief Narrative:  82 y/o with h/o CVA, HTN, HLD, breast cancer presenting for dizziness, daily AM nausea and a low sodium. Sodium 122 in ER.   Subjective: No nausea or dizziness today.   Assessment & Plan:   Principal Problem:   Hyponatremia  - sodium improved with NS but she became fluid overloaded and Lasix was started- Sodium subsequently dropped again- have consulted renal to assist. - they have started fluid restriction and sodium tabs- sodium tabs stopped today and Lasix resumed - CXR>> Atelectasis vs edema in RLL-   ECHO shows poor EF and diastolic CHF - CT shows RLL scarring likely from prior radiation -hypoxic to 80s on ambulation- started IS - had extensive work up last admission and no cause was found -U sodium 18, U osm 239, S osm 252 on admission - cont to follow I and O and daily weights  Active Problems: Nausea - appears to get worse when sodium drops below 125  CHF- acute on chronic- systolic and diastolic - ECHO shows EF of 40-4 5%, grade 2 diastolic dysfunction and mod pulm HTN which is a new diagnosis for her - given Lasix but sodium dropped- currently on fluid restriction- lasix being resumed today     Hypercholesteremia - lipitor   CVA and  CAD (coronary artery disease) - Plavix, Lipitor    Leukocytosis - mild- was placed on Levaquin for possible UTI- she did not have symptoms of a UTI- UA negative in ER- follow    Essential hypertension - BP was low but now rising- resumed atenolol at 1/2 dose   DVT prophylaxis: heparin Code Status: full code Family Communication: daughter Disposition Plan:  Consultants:   none Procedures:  2 D ECHO 7/28 Study Conclusions  - Left ventricle: The cavity size was normal. There was mild focal   basal hypertrophy of the septum. Systolic function was mildly to   moderately reduced. The estimated  ejection fraction was in the   range of 40% to 45%. Global hypokinesis worse in the basal-mid   anteroseptal and inferoseptal myocardium. Features are consistent   with a pseudonormal left ventricular filling pattern, with   concomitant abnormal relaxation and increased filling pressure   (grade 2 diastolic dysfunction). Doppler parameters are   consistent with high ventricular filling pressure. - Aortic valve: Transvalvular velocity was within the normal range.   There was no stenosis. There was no regurgitation. - Mitral valve: Moderately calcified annulus. There was mild   regurgitation. Valve area by continuity equation (using LVOT   flow): 1.06 cm^2. - Left atrium: The atrium was mildly dilated. - Right ventricle: Systolic function was moderately reduced. - Pulmonary arteries: Systolic pressure was moderately increased.   PA peak pressure: 51 mm Hg (S).  Antimicrobials:  Anti-infectives    None       Objective: Vitals:   03/22/16 1109 03/22/16 1432 03/22/16 2302 03/23/16 0602  BP: (!) 129/56 (!) 128/53 (!) 143/70 (!) 110/53  Pulse: 91 71 71 (!) 53  Resp:   20 18  Temp:  98.2 F (36.8 C) 98.9 F (37.2 C) 97.5 F (36.4 C)  TempSrc:  Oral Oral Oral  SpO2: 94% 97% 95% 93%  Weight:    62.1 kg (136 lb 14.4 oz)  Height:        Intake/Output Summary (Last 24 hours) at 03/23/16 1235 Last data filed at 03/23/16  [id number]  Gross per 24 hour  Intake              720 ml  Output             1250 ml  Net             -530 ml   Filed Weights   03/21/16 0538 03/22/16 0521 03/23/16 0602  Weight: 63.5 kg (139 lb 15.9 oz) 62.2 kg (137 lb 3.2 oz) 62.1 kg (136 lb 14.4 oz)    Examination: General exam: Appears comfortable  HEENT: PERRLA, oral mucosa moist, no sclera icterus or thrush Respiratory system: crackles in RLL-  Respiratory effort normal. Cardiovascular system: S1 & S2 heard, RRR.  No murmurs  Gastrointestinal system: Abdomen soft, non-tender, nondistended. Normal bowel  sound. No organomegaly Central nervous system: Alert and oriented. No focal neurological deficits. Extremities: No cyanosis, clubbing or edema Skin: No rashes or ulcers Psychiatry:  Mood & affect appropriate.     Data Reviewed: I have personally reviewed following labs and imaging studies  CBC:  Recent Labs Lab 03/17/16 1833 03/19/16 0530 03/22/16 1536  WBC 11.5* 8.3 10.2  HGB 9.5* 9.2* 9.5*  HCT 27.9* 27.5* 28.7*  MCV 85.1 86.5 86.7  PLT 345 334 [id number]   Basic Metabolic Panel:  Recent Labs Lab 03/17/16 1833  03/21/16 0519 03/21/16 1557 03/22/16 0404 03/22/16 1536 03/23/16 0458  NA 122*  < > 124* 123* 125* 128* 129*  K 4.8  < > 4.2 4.4 4.1 3.9 3.7  CL 87*  < > 88* 83* 87* 91* 93*  CO2 24  < > 29 29 30 29 29   GLUCOSE 120*  < > 126* 157* 130* 126* 115*  BUN 10  < > 9 9 9 11 10   CREATININE 0.71  < > 0.50 0.68 0.69 0.66 0.67  CALCIUM 8.6*  < > 8.0* 8.4* 8.4* 8.2* 8.3*  MG 1.6*  --   --   --   --   --   --   PHOS 3.4  --   --   --   --   --   --   < > = values in this interval not displayed. GFR: Estimated Creatinine Clearance: 43.3 mL/min (by C-G formula based on SCr of 0.8 mg/dL). Liver Function Tests:  Recent Labs Lab 03/17/16 1833  AST 25  ALT 34  ALKPHOS 81  BILITOT 1.0  PROT 6.4*  ALBUMIN 2.9*   No results for input(s): LIPASE, AMYLASE in the last 168 hours. No results for input(s): AMMONIA in the last 168 hours. Coagulation Profile: No results for input(s): INR, PROTIME in the last 168 hours. Cardiac Enzymes: No results for input(s): CKTOTAL, CKMB, CKMBINDEX, TROPONINI in the last 168 hours. BNP (last 3 results) No results for input(s): PROBNP in the last 8760 hours. HbA1C: No results for input(s): HGBA1C in the last 72 hours. CBG: No results for input(s): GLUCAP in the last 168 hours. Lipid Profile: No results for input(s): CHOL, HDL, LDLCALC, TRIG, CHOLHDL, LDLDIRECT in the last 72 hours. Thyroid Function Tests: No results for input(s): TSH,  T4TOTAL, FREET4, T3FREE, THYROIDAB in the last 72 hours. Anemia Panel:  Recent Labs  03/22/16 1536  VITAMINB12 603  FOLATE 31.3  FERRITIN 331*  TIBC 277  IRON 27*  RETICCTPCT 3.7*   Urine analysis:    Component Value Date/Time   COLORURINE YELLOW 03/17/2016 1740   APPEARANCEUR CLEAR 03/17/2016 1740   LABSPEC 1.009 03/17/2016 1740   PHURINE  7.0 03/17/2016 1740   GLUCOSEU NEGATIVE 03/17/2016 1740   HGBUR NEGATIVE 03/17/2016 1740   BILIRUBINUR NEGATIVE 03/17/2016 1740   KETONESUR NEGATIVE 03/17/2016 1740   PROTEINUR NEGATIVE 03/17/2016 1740   NITRITE NEGATIVE 03/17/2016 1740   LEUKOCYTESUR NEGATIVE 03/17/2016 1740   Sepsis Labs: @LABRCNTIP (procalcitonin:4,lacticidven:4) ) Recent Results (from the past 240 hour(s))  Culture, blood (Routine X 2) w Reflex to ID Panel     Status: None   Collection Time: 03/17/16  6:33 PM  Result Value Ref Range Status   Specimen Description BLOOD LEFT ARM  Final   Special Requests BOTTLES DRAWN AEROBIC AND ANAEROBIC 5CC  Final   Culture   Final    NO GROWTH 5 DAYS Performed at [facility]    Report Status 03/22/2016 FINAL  Final  Culture, blood (Routine X 2) w Reflex to ID Panel     Status: None   Collection Time: 03/17/16  6:33 PM  Result Value Ref Range Status   Specimen Description BLOOD RIGHT WRIST  Final   Special Requests IN PEDIATRIC BOTTLE .5CC  Final   Culture   Final    NO GROWTH 5 DAYS Performed at [facility]    Report Status 03/22/2016 FINAL  Final         Radiology Studies: Ct Chest Wo Contrast  Result Date: 03/21/2016 CLINICAL DATA:  82 year old female with shortness of breath and cough. EXAM: CT CHEST WITHOUT CONTRAST TECHNIQUE: Multidetector CT imaging of the chest was performed following the standard protocol without IV contrast. COMPARISON:  03/20/2016 and prior chest radiographs FINDINGS: Cardiovascular: Cardiomegaly, moderate coronary artery calcifications and aortic valvular calcifications  noted. Thoracic aortic atherosclerotic calcifications noted without aneurysm. Mediastinum/Nodes: No mediastinal mass. Shotty mediastinal and bilateral hilar lymph nodes are noted. There is no evidence of pericardial effusion. The patient is status post right mastectomy. Lungs/Pleura: Right hemithorax volume loss noted. There are patchy confluent opacities within the visualized right lung, greatest posteriorly, some which represent chronic changes/atelectasis/posttreatment changes but concurrent infection is not excluded. Mild patchy ground-glass/airspace opacities within the posterior left lower lobe identified and may represent infection or aspiration. There is no discrete pulmonary mass identified. A small to moderate right pleural effusion and trace left pleural effusion noted. There is no evidence of pneumothorax. Upper Abdomen: A small hiatal hernia is noted. Musculoskeletal: No acute or suspicious abnormalities. IMPRESSION: Patchy confluent opacities within the visualized right lung, some which represent chronic/posttreatment changes or atelectasis. Superimposed infection/pneumonia is not excluded. Mild patchy ground-glass/airspace opacities within the posterior lower lobe which may represent pneumonia or aspiration. Small moderate right pleural effusion and trace left pleural effusion. Cardiomegaly, coronary disease and aortic valvular calcifications. Small hiatal hernia. Electronically Signed   By: Margarette Canada M.D.   On: 03/21/2016 15:51     Scheduled Meds: . atenolol  50 mg Oral Daily  . atorvastatin  80 mg Oral QHS  . busPIRone  5 mg Oral BID  . cholecalciferol  2,000 Units Oral QPM  . clopidogrel  75 mg Oral Daily  . folic acid  1 mg Oral Daily  . furosemide  10 mg Oral Daily  . heparin  5,000 Units Subcutaneous Q8H  . pantoprazole  40 mg Oral Daily  . vitamin C  1,000 mg Oral Daily   Continuous Infusions:     LOS: 5 days    Time spent in minutes: 47    Mills, MD [facility] Pager: [email or web address] Password [facility] 03/23/2016, 12:35 PM

## 2016-03-24 DIAGNOSIS — I5042 Chronic combined systolic (congestive) and diastolic (congestive) heart failure: Secondary | ICD-10-CM

## 2016-03-24 DIAGNOSIS — J9601 Acute respiratory failure with hypoxia: Secondary | ICD-10-CM

## 2016-03-24 DIAGNOSIS — J701 Chronic and other pulmonary manifestations due to radiation: Secondary | ICD-10-CM

## 2016-03-24 DIAGNOSIS — D638 Anemia in other chronic diseases classified elsewhere: Secondary | ICD-10-CM

## 2016-03-24 LAB — BASIC METABOLIC PANEL
Anion gap: 8 (ref 5–15)
BUN: 11 mg/dL (ref 6–20)
CO2: 31 mmol/L (ref 22–32)
Calcium: 8.5 mg/dL — ABNORMAL LOW (ref 8.9–10.3)
Chloride: 94 mmol/L — ABNORMAL LOW (ref 101–111)
Creatinine, Ser: 0.61 mg/dL (ref 0.44–1.00)
GFR calc Af Amer: >60 mL/min (ref >60)
GFR calc non Af Amer: >60 mL/min (ref >60)
Glucose, Bld: 117 mg/dL — ABNORMAL HIGH (ref 65–99)
Potassium: 3.9 mmol/L (ref 3.5–5.1)
Sodium: 133 mmol/L — ABNORMAL LOW (ref 135–145)

## 2016-03-24 NOTE — Progress Notes (Signed)
[name] Progress Note   Subjective: sodium trending up slowly to 133 today - she has no complaints- is eating well   Vitals:   03/23/16 1627 03/23/16 2211 03/24/16 0559 03/24/16 0615  BP:  (!) 150/65 (!) 140/50   Pulse:  77 67   Resp:  19 18   Temp:  99.8 F (37.7 C) 97.8 F (36.6 C)   TempSrc:  Oral Oral   SpO2: 93% 99% 97%   Weight:    62.1 kg (136 lb 12.8 oz)  Height:        Inpatient medications: . atenolol  50 mg Oral Daily  . atorvastatin  80 mg Oral QHS  . busPIRone  5 mg Oral BID  . cholecalciferol  2,000 Units Oral QPM  . clopidogrel  75 mg Oral Daily  . folic acid  1 mg Oral Daily  . heparin  5,000 Units Subcutaneous Q8H  . pantoprazole  40 mg Oral Daily  . vitamin C  1,000 mg Oral Daily     acetaminophen, ondansetron (ZOFRAN) IV, ondansetron, polyethylene glycol, prochlorperazine  Exam: Gen Alert elderly WF no distress No rash, cyanosis or gangrene Sclera anicteric, throat clear  No jvd or bruits Chest rales R base, L clear, bilat mastectomy scars RRR no MRG Abd soft ntnd no mass or ascites +bs GU defer MS no joint effusions or deformity Ext no LE or UE edema / no wounds or ulcers Neuro is alert, Ox 3 , nf  Na 124   CO2 29  BUN 9  Creat 0.50  BNP 1667 ^^    ECHO  EF 40-45%  G2DD  Assessment: 1.  Hyponatremia - recurrent, was here in April w same treated w holding SSRI.   CM EF 40-45% but euvolemic, no obvious vol excess, low urine osm. CT chest wo vol excess, will treat as low solute given poor po intake, - she says she is eating better so I will stop salt tabs- place on very low dose lasix just to keep urine dilute- agree with fluid restriction.  Making good progress- I would be OK with her being discharged and having PCP just check labs as OP 2.  HTN - would dc acei and avoid ace/  ARB in this patient- just slightly high but be careful not to overtreat 3.  Breast Ca sp double mastect 2008 4.   DM2  5.   Anxiety on Buspar 6.  Dispo- I would be OK with discharge - follow up labs next week at PCP- keep fluid restriction and low dose lasix- does not need nephrology specific follow up   Renal will sign off, call with questions     Trivia Heffelfinger A  03/24/2016, 10:58 AM    Recent Labs Lab 03/17/16 1833  03/23/16 0458 03/23/16 1558 03/24/16 0349  NA 122*  < > 129* 130* 133*  K 4.8  < > 3.7 4.4 3.9  CL 87*  < > 93* 93* 94*  CO2 24  < > 29 30 31   GLUCOSE 120*  < > 115* 157* 117*  BUN 10  < > 10 11 11   CREATININE 0.71  < > 0.67 0.73 0.61  CALCIUM 8.6*  < > 8.3* 8.1* 8.5*  PHOS 3.4  --   --   --   --   < > = values in this interval not displayed.  Recent Labs Lab 03/17/16 1833  AST 25  ALT 34  ALKPHOS 81  BILITOT 1.0  PROT 6.4*  ALBUMIN 2.9*    Recent Labs Lab 03/17/16 1833 03/19/16 0530 03/22/16 1536  WBC 11.5* 8.3 10.2  HGB 9.5* 9.2* 9.5*  HCT 27.9* 27.5* 28.7*  MCV 85.1 86.5 86.7  PLT 345 334 386   Iron/TIBC/Ferritin/ %Sat    Component Value Date/Time   IRON 27 (L) 03/22/2016 1536   TIBC 277 03/22/2016 1536   FERRITIN 331 (H) 03/22/2016 1536   IRONPCTSAT 10 (L) 03/22/2016 1536

## 2016-03-24 NOTE — Discharge Summary (Addendum)
Physician Discharge Summary  Athalene Uthoff [id number] DOB: [birth year] DOA: 03/17/2016  PCP: [name], MD  Admit date: 03/17/2016 Discharge date: 03/24/2016  Admitted From: home  Disposition:  Home- lives alone- daughters check on her   Recommendations for Outpatient Follow-up:  1. Re-assess need for O2 in 2-3 wks 2. Check sodium, BUN, Cr q week at minimum due to fluid restriction 3. F/u on chronic heart failure 4. F/u on anemia- needs stool cards 5. F/u renal function on ACE I  Home Health:  yes  Equipment/Devices:  O2    Discharge Condition:  stable   CODE STATUS:  Full code   Diet recommendation:  Regular diet Consultations:  Nephrology     Discharge Diagnoses:  Principal Problem:   Hyponatremia Active Problems:   Anemia of chronic disease   Chronic combined systolic and diastolic congestive heart failure (HCC)   Acute respiratory failure with hypoxia (HCC)   Scarring of lung following radiation- RLL   Hypercholesteremia   Stroke (HCC)   CAD (coronary artery disease)   Leukocytosis   Essential hypertension    Subjective: Dizziness has resolved. She feels it is because she is wearing O2. No nausea. Eating well.   Brief Summary: 82 y/o with h/o CVA, HTN, HLD, breast cancer presenting for dizziness, daily morning nausea and a low sodium. Sodium 122 in ER. No h/o PUD, gastritis, reflux or weight loss.  Hospital Course:  Principal Problem:   Hyponatremia - symptomatic with dizziness and nausea - had extensive work up last admission and no cause was found- a number of psych meds were discontinued  -  U sodium 18, U osm 239, S osm 252  Suggestive of dehydration however the patient states she drinks a great deal of water and iced tea which on the most part is decaffeinated. - sodium improved with IV NS - Lasix and sodium tabs were later started to help diurese off free water and increase sodium - Sodium subsequently dropped again - I consulted renal to  assist. - they started fluid restriction and sodium tabs- sodium tabs stopped yesterday and Lasix resumed- sodium has improved to 133 - goal is to restrict free water to 1200 cc and follow sodium at least weekly for now   Active Problems: Nausea - appears to get worse when sodium drops below 125- resolved for now- eating well in the hospital- as mentioned on h/o PUD and no signs of this in the hospital  CHF-   chronic- systolic and diastolic - ECHO shows EF of 40-4 5%, grade 2 diastolic dysfunction and mod pulm HTN which is a new diagnosis for her - will continue Altace and Atenolol - fluid restrict - no need for diuretics yet  Chronic RLL scarring - gives false appearance of pneumonia on CXR  Acute hypoxic resp failure - due to underlying scarring and atelectasis - will go home with O2  Anemia - Hb drop from 12 to 9- no signs of blood loss  - anemia panel consistent with AOCD - still would like her to have stool occults checked as outpt Results for BRINLEIGH, THU (MRN [id number]) as of 03/24/2016 12:53  Ref. Range 03/22/2016 15:36  Iron Latest Ref Range: 28 - 170 ug/dL 27 (L)  UIBC Latest Units: ug/dL 250  TIBC Latest Ref Range: 250 - 450 ug/dL 277  Saturation Ratios Latest Ref Range: 10.4 - 31.8 % 10 (L)  Ferritin Latest Ref Range: 11 - 307 ng/mL 331 (H)  Folate Latest Ref Range: >  5.9 ng/mL 31.3    Results for MALAIJA, BOILARD (MRN [id number]) as of 03/24/2016 12:53  Ref. Range 03/22/2016 04:04 03/22/2016 15:36 03/23/2016 04:58 03/23/2016 15:58 03/24/2016 03:49  Vitamin B12 Latest Ref Range: 180 - 914 pg/mL  603      Results for VERGINIA, METHENEY (MRN [id number]) as of 03/24/2016 12:53  Ref. Range 03/22/2016 15:36  RBC. Latest Ref Range: 3.87 - 5.11 MIL/uL 3.31 (L)  Retic Ct Pct Latest Ref Range: 0.4 - 3.1 % 3.7 (H)  Retic Count, Manual Latest Ref Range: 19.0 - 186.0 K/uL 122.5     Hypercholesteremia - lipitor   CVA and  CAD (coronary artery disease) - Plavix, Lipitor     Leukocytosis - mild- was placed on Levaquin for possible UTI- she did not have symptoms of a UTI- UA negative in ER- follow    Essential hypertension - BP was low but now rising- resumed atenolol at 1/2 dose  Procedures:  2 D ECHO 7/28 Study Conclusions  - Left ventricle: The cavity size was normal. There was mild focal basal hypertrophy of the septum. Systolic function was mildly to moderately reduced. The estimated ejection fraction was in the range of 40% to 45%. Global hypokinesis worse in the basal-mid anteroseptal and inferoseptal myocardium. Features are consistent with a pseudonormal left ventricular filling pattern, with concomitant abnormal relaxation and increased filling pressure (grade 2 diastolic dysfunction). Doppler parameters are consistent with high ventricular filling pressure. - Aortic valve: Transvalvular velocity was within the normal range. There was no stenosis. There was no regurgitation. - Mitral valve: Moderately calcified annulus. There was mild regurgitation. Valve area by continuity equation (using LVOT flow): 1.06 cm^2. - Left atrium: The atrium was mildly dilated. - Right ventricle: Systolic function was moderately reduced. - Pulmonary arteries: Systolic pressure was moderately increased. PA peak pressure: 51 mm Hg (S).   Discharge Instructions  Discharge Instructions    Discharge instructions    Complete by:  As directed   Regular diet Blood work needs to be checked on Friday.   Increase activity slowly    Complete by:  As directed       Medication List    TAKE these medications   acetaminophen 500 MG tablet Commonly known as:  TYLENOL Take 1,000 mg by mouth every 6 (six) hours as needed for moderate pain or headache.   atenolol 50 MG tablet Commonly known as:  TENORMIN Take 100 mg by mouth daily.   atorvastatin 80 MG tablet Commonly known as:  LIPITOR Take 80 mg by mouth at bedtime.   BIOTIN PO Take  1 tablet by mouth daily.   bismuth subsalicylate [id number] suspension Commonly known as:  PEPTO BISMOL Take 30 mLs by mouth every 6 (six) hours as needed (nausea).   busPIRone 5 MG tablet Commonly known as:  BUSPAR Take 1 tablet (5 mg total) by mouth 2 (two) times daily.   cholecalciferol 1000 units tablet Commonly known as:  VITAMIN D Take 2,000 Units by mouth every evening.   clopidogrel 75 MG tablet Commonly known as:  PLAVIX Take 75 mg by mouth daily.   FOLIC ACID PO Take 1 tablet by mouth daily.   Melatonin 5 MG Tabs Take 10 mg by mouth at bedtime as needed (sleep).   multivitamin with minerals Tabs tablet Take 1 tablet by mouth daily.   omeprazole 20 MG capsule Commonly known as:  PRILOSEC Take 20 mg by mouth every morning.   ramipril 10 MG capsule Commonly known  as:  ALTACE Take 20 mg by mouth daily.   vitamin C 500 MG tablet Commonly known as:  ASCORBIC ACID Take 1,000 mg by mouth daily.      Follow-up Information    [name], MD On 03/24/2016.   Specialty:  Family Medicine Why:  at 1:00pm  For Post Hospitalization Follow Up with Marda Stalker, Arrive 15 minutes prior to appointmentt, will need a Bmet, U sodium, Serum and Urine osmolality Contact information: [place] [address] [phone]          Allergies  Allergen Reactions  . Morphine And Related Nausea And Vomiting  . Other Other (See Comments)    Barley & Carrots: learned through allergy testing. Unknown reaction  . Sulfa Antibiotics Hives  . Nickel Rash     Procedures/Studies:  Dg Chest 2 View  Result Date: 03/17/2016 CLINICAL DATA:  Shortness of breath and cough. EXAM: CHEST  2 VIEW COMPARISON:  12/16/2015 FINDINGS: Lungs are hyperexpanded consistent with emphysema. Right upper lung scarring is again noted. Stable appearance of peripheral calcified granuloma. Interstitial changes at the bases are likely scarring although there is some increase  alveolar opacity at the right base suggesting pneumonia. The cardio pericardial silhouette is enlarged. The visualized bony structures of the thorax are intact. IMPRESSION: Emphysema with chronic interstitial lung disease. New focal airspace opacity at the right base compatible with pneumonia. Electronically Signed   By: Misty Stanley M.D.   On: 03/17/2016 19:52  Ct Chest Wo Contrast  Result Date: 03/21/2016 CLINICAL DATA:  82 year old female with shortness of breath and cough. EXAM: CT CHEST WITHOUT CONTRAST TECHNIQUE: Multidetector CT imaging of the chest was performed following the standard protocol without IV contrast. COMPARISON:  03/20/2016 and prior chest radiographs FINDINGS: Cardiovascular: Cardiomegaly, moderate coronary artery calcifications and aortic valvular calcifications noted. Thoracic aortic atherosclerotic calcifications noted without aneurysm. Mediastinum/Nodes: No mediastinal mass. Shotty mediastinal and bilateral hilar lymph nodes are noted. There is no evidence of pericardial effusion. The patient is status post right mastectomy. Lungs/Pleura: Right hemithorax volume loss noted. There are patchy confluent opacities within the visualized right lung, greatest posteriorly, some which represent chronic changes/atelectasis/posttreatment changes but concurrent infection is not excluded. Mild patchy ground-glass/airspace opacities within the posterior left lower lobe identified and may represent infection or aspiration. There is no discrete pulmonary mass identified. A small to moderate right pleural effusion and trace left pleural effusion noted. There is no evidence of pneumothorax. Upper Abdomen: A small hiatal hernia is noted. Musculoskeletal: No acute or suspicious abnormalities. IMPRESSION: Patchy confluent opacities within the visualized right lung, some which represent chronic/posttreatment changes or atelectasis. Superimposed infection/pneumonia is not excluded. Mild patchy  ground-glass/airspace opacities within the posterior lower lobe which may represent pneumonia or aspiration. Small moderate right pleural effusion and trace left pleural effusion. Cardiomegaly, coronary disease and aortic valvular calcifications. Small hiatal hernia. Electronically Signed   By: Margarette Canada M.D.   On: 03/21/2016 15:51  Dg Chest Port 1 View  Result Date: 03/20/2016 CLINICAL DATA:  Hypoxia. EXAM: PORTABLE CHEST 1 VIEW COMPARISON:  03/17/2016 and 12/14/2015 FINDINGS: Persistent interstitial densities at the right lung base are concerning for an infectious or inflammatory process. Chronic scarring or volume loss in the right lung. Stable calcifications along the lateral right upper chest. No acute findings in the left lung. Heart and mediastinum are stable. IMPRESSION: Persistent patchy densities at the right lung base are concerning for an infectious or inflammatory process. Minimal change from the recent comparison  examination. Chronic lung changes. Electronically Signed   By: Markus Daft M.D.   On: 03/20/2016 12:47      Discharge Exam: Vitals:   03/23/16 2211 03/24/16 0559  BP: (!) 150/65 (!) 140/50  Pulse: 77 67  Resp: 19 18  Temp: 99.8 F (37.7 C) 97.8 F (36.6 C)   Vitals:   03/23/16 1627 03/23/16 2211 03/24/16 0559 03/24/16 0615  BP:  (!) 150/65 (!) 140/50   Pulse:  77 67   Resp:  19 18   Temp:  99.8 F (37.7 C) 97.8 F (36.6 C)   TempSrc:  Oral Oral   SpO2: 93% 99% 97%   Weight:    62.1 kg (136 lb 12.8 oz)  Height:        General: Pt is alert, awake, not in acute distress Cardiovascular: RRR, S1/S2 +, no rubs, no gallops Respiratory: CTA bilaterally, no wheezing, no rhonchi Abdominal: Soft, NT, ND, bowel sounds + Extremities: no edema, no cyanosis    The results of significant diagnostics from this hospitalization (including imaging, microbiology, ancillary and laboratory) are listed below for reference.     Microbiology: Recent Results (from the past  240 hour(s))  Culture, blood (Routine X 2) w Reflex to ID Panel     Status: None   Collection Time: 03/17/16  6:33 PM  Result Value Ref Range Status   Specimen Description BLOOD LEFT ARM  Final   Special Requests BOTTLES DRAWN AEROBIC AND ANAEROBIC 5CC  Final   Culture   Final    NO GROWTH 5 DAYS Performed at [facility]    Report Status 03/22/2016 FINAL  Final  Culture, blood (Routine X 2) w Reflex to ID Panel     Status: None   Collection Time: 03/17/16  6:33 PM  Result Value Ref Range Status   Specimen Description BLOOD RIGHT WRIST  Final   Special Requests IN PEDIATRIC BOTTLE .5CC  Final   Culture   Final    NO GROWTH 5 DAYS Performed at [facility]    Report Status 03/22/2016 FINAL  Final     Labs: BNP (last 3 results)  Recent Labs  03/20/16 0500  BNP 0000000*   Basic Metabolic Panel:  Recent Labs Lab 03/17/16 1833  03/22/16 0404 03/22/16 1536 03/23/16 0458 03/23/16 1558 03/24/16 0349  NA 122*  < > 125* 128* 129* 130* 133*  K 4.8  < > 4.1 3.9 3.7 4.4 3.9  CL 87*  < > 87* 91* 93* 93* 94*  CO2 24  < > 30 29 29 30 31   GLUCOSE 120*  < > 130* 126* 115* 157* 117*  BUN 10  < > 9 11 10 11 11   CREATININE 0.71  < > 0.69 0.66 0.67 0.73 0.61  CALCIUM 8.6*  < > 8.4* 8.2* 8.3* 8.1* 8.5*  MG 1.6*  --   --   --   --   --   --   PHOS 3.4  --   --   --   --   --   --   < > = values in this interval not displayed. Liver Function Tests:  Recent Labs Lab 03/17/16 1833  AST 25  ALT 34  ALKPHOS 81  BILITOT 1.0  PROT 6.4*  ALBUMIN 2.9*   No results for input(s): LIPASE, AMYLASE in the last 168 hours. No results for input(s): AMMONIA in the last 168 hours. CBC:  Recent Labs Lab 03/17/16 1833 03/19/16 0530 03/22/16  1536  WBC 11.5* 8.3 10.2  HGB 9.5* 9.2* 9.5*  HCT 27.9* 27.5* 28.7*  MCV 85.1 86.5 86.7  PLT 345 334 386   Cardiac Enzymes: No results for input(s): CKTOTAL, CKMB, CKMBINDEX, TROPONINI in the last 168 hours. BNP: Invalid  input(s): POCBNP CBG: No results for input(s): GLUCAP in the last 168 hours. D-Dimer No results for input(s): DDIMER in the last 72 hours. Hgb A1c No results for input(s): HGBA1C in the last 72 hours. Lipid Profile No results for input(s): CHOL, HDL, LDLCALC, TRIG, CHOLHDL, LDLDIRECT in the last 72 hours. Thyroid function studies No results for input(s): TSH, T4TOTAL, T3FREE, THYROIDAB in the last 72 hours.  Invalid input(s): FREET3 Anemia work up  Recent Labs  03/22/16 1536  VITAMINB12 603  FOLATE 31.3  FERRITIN 331*  TIBC 277  IRON 27*  RETICCTPCT 3.7*   Urinalysis    Component Value Date/Time   COLORURINE YELLOW 03/17/2016 1740   APPEARANCEUR CLEAR 03/17/2016 1740   LABSPEC 1.009 03/17/2016 1740   PHURINE 7.0 03/17/2016 1740   GLUCOSEU NEGATIVE 03/17/2016 1740   HGBUR NEGATIVE 03/17/2016 1740   BILIRUBINUR NEGATIVE 03/17/2016 1740   KETONESUR NEGATIVE 03/17/2016 1740   PROTEINUR NEGATIVE 03/17/2016 1740   NITRITE NEGATIVE 03/17/2016 1740   LEUKOCYTESUR NEGATIVE 03/17/2016 1740   Sepsis Labs Invalid input(s): PROCALCITONIN,  WBC,  LACTICIDVEN Microbiology Recent Results (from the past 240 hour(s))  Culture, blood (Routine X 2) w Reflex to ID Panel     Status: None   Collection Time: 03/17/16  6:33 PM  Result Value Ref Range Status   Specimen Description BLOOD LEFT ARM  Final   Special Requests BOTTLES DRAWN AEROBIC AND ANAEROBIC 5CC  Final   Culture   Final    NO GROWTH 5 DAYS Performed at [facility]    Report Status 03/22/2016 FINAL  Final  Culture, blood (Routine X 2) w Reflex to ID Panel     Status: None   Collection Time: 03/17/16  6:33 PM  Result Value Ref Range Status   Specimen Description BLOOD RIGHT WRIST  Final   Special Requests IN PEDIATRIC BOTTLE .5CC  Final   Culture   Final    NO GROWTH 5 DAYS Performed at [facility]    Report Status 03/22/2016 FINAL  Final     Time coordinating discharge: Over 30  minutes  SIGNED:   Debbe Odea, MD  [facility] 03/24/2016, 12:53 PM Pager   If 7PM-7AM, please contact night-coverage [email or web address] Password [email or web address]

## 2016-03-24 NOTE — Progress Notes (Signed)
SATURATION QUALIFICATIONS: (This note is used to comply with regulatory documentation for home oxygen)  Patient Saturations on Room Air at Rest = 87%      

## 2016-03-24 NOTE — Discharge Instructions (Signed)
1200 cc of fluids a day. Can use Ensure replacement for your meals.

## 2016-03-24 NOTE — Progress Notes (Signed)
Spoke with pt and daughter(Erin) at bedside at [place] [facility] concerning discharge plans.  Pt and [name] state that they will continue with [facility] Information faxed to [phone]. O2 from [place].

## 2016-03-25 DIAGNOSIS — Z9981 Dependence on supplemental oxygen: Secondary | ICD-10-CM | POA: Diagnosis not present

## 2016-03-25 DIAGNOSIS — I2510 Atherosclerotic heart disease of native coronary artery without angina pectoris: Secondary | ICD-10-CM | POA: Diagnosis not present

## 2016-03-25 DIAGNOSIS — E1142 Type 2 diabetes mellitus with diabetic polyneuropathy: Secondary | ICD-10-CM | POA: Diagnosis not present

## 2016-03-25 DIAGNOSIS — Z9181 History of falling: Secondary | ICD-10-CM | POA: Diagnosis not present

## 2016-03-25 DIAGNOSIS — I110 Hypertensive heart disease with heart failure: Secondary | ICD-10-CM | POA: Diagnosis not present

## 2016-03-25 DIAGNOSIS — Z8673 Personal history of transient ischemic attack (TIA), and cerebral infarction without residual deficits: Secondary | ICD-10-CM | POA: Diagnosis not present

## 2016-03-25 DIAGNOSIS — R2681 Unsteadiness on feet: Secondary | ICD-10-CM | POA: Diagnosis not present

## 2016-03-25 DIAGNOSIS — Z853 Personal history of malignant neoplasm of breast: Secondary | ICD-10-CM | POA: Diagnosis not present

## 2016-03-25 DIAGNOSIS — R339 Retention of urine, unspecified: Secondary | ICD-10-CM | POA: Diagnosis not present

## 2016-03-25 DIAGNOSIS — E7800 Pure hypercholesterolemia, unspecified: Secondary | ICD-10-CM | POA: Diagnosis not present

## 2016-03-25 DIAGNOSIS — F329 Major depressive disorder, single episode, unspecified: Secondary | ICD-10-CM | POA: Diagnosis not present

## 2016-03-25 DIAGNOSIS — I5042 Chronic combined systolic (congestive) and diastolic (congestive) heart failure: Secondary | ICD-10-CM | POA: Diagnosis not present

## 2016-03-25 DIAGNOSIS — K219 Gastro-esophageal reflux disease without esophagitis: Secondary | ICD-10-CM | POA: Diagnosis not present

## 2016-03-25 DIAGNOSIS — E1151 Type 2 diabetes mellitus with diabetic peripheral angiopathy without gangrene: Secondary | ICD-10-CM | POA: Diagnosis not present

## 2016-03-26 DIAGNOSIS — I5042 Chronic combined systolic (congestive) and diastolic (congestive) heart failure: Secondary | ICD-10-CM | POA: Diagnosis not present

## 2016-03-26 DIAGNOSIS — R2681 Unsteadiness on feet: Secondary | ICD-10-CM | POA: Diagnosis not present

## 2016-03-26 DIAGNOSIS — E1142 Type 2 diabetes mellitus with diabetic polyneuropathy: Secondary | ICD-10-CM | POA: Diagnosis not present

## 2016-03-26 DIAGNOSIS — E1151 Type 2 diabetes mellitus with diabetic peripheral angiopathy without gangrene: Secondary | ICD-10-CM | POA: Diagnosis not present

## 2016-03-26 DIAGNOSIS — I2510 Atherosclerotic heart disease of native coronary artery without angina pectoris: Secondary | ICD-10-CM | POA: Diagnosis not present

## 2016-03-26 DIAGNOSIS — I110 Hypertensive heart disease with heart failure: Secondary | ICD-10-CM | POA: Diagnosis not present

## 2016-03-27 DIAGNOSIS — I5042 Chronic combined systolic (congestive) and diastolic (congestive) heart failure: Secondary | ICD-10-CM | POA: Diagnosis not present

## 2016-03-27 DIAGNOSIS — I2510 Atherosclerotic heart disease of native coronary artery without angina pectoris: Secondary | ICD-10-CM | POA: Diagnosis not present

## 2016-03-27 DIAGNOSIS — E871 Hypo-osmolality and hyponatremia: Secondary | ICD-10-CM | POA: Diagnosis not present

## 2016-03-27 DIAGNOSIS — E1142 Type 2 diabetes mellitus with diabetic polyneuropathy: Secondary | ICD-10-CM | POA: Diagnosis not present

## 2016-03-27 DIAGNOSIS — I110 Hypertensive heart disease with heart failure: Secondary | ICD-10-CM | POA: Diagnosis not present

## 2016-03-27 DIAGNOSIS — E1151 Type 2 diabetes mellitus with diabetic peripheral angiopathy without gangrene: Secondary | ICD-10-CM | POA: Diagnosis not present

## 2016-03-27 DIAGNOSIS — R2681 Unsteadiness on feet: Secondary | ICD-10-CM | POA: Diagnosis not present

## 2016-03-31 DIAGNOSIS — E1142 Type 2 diabetes mellitus with diabetic polyneuropathy: Secondary | ICD-10-CM | POA: Diagnosis not present

## 2016-03-31 DIAGNOSIS — R2681 Unsteadiness on feet: Secondary | ICD-10-CM | POA: Diagnosis not present

## 2016-03-31 DIAGNOSIS — I5042 Chronic combined systolic (congestive) and diastolic (congestive) heart failure: Secondary | ICD-10-CM | POA: Diagnosis not present

## 2016-03-31 DIAGNOSIS — I2510 Atherosclerotic heart disease of native coronary artery without angina pectoris: Secondary | ICD-10-CM | POA: Diagnosis not present

## 2016-03-31 DIAGNOSIS — I110 Hypertensive heart disease with heart failure: Secondary | ICD-10-CM | POA: Diagnosis not present

## 2016-03-31 DIAGNOSIS — E1151 Type 2 diabetes mellitus with diabetic peripheral angiopathy without gangrene: Secondary | ICD-10-CM | POA: Diagnosis not present

## 2016-04-01 DIAGNOSIS — E1151 Type 2 diabetes mellitus with diabetic peripheral angiopathy without gangrene: Secondary | ICD-10-CM | POA: Diagnosis not present

## 2016-04-01 DIAGNOSIS — I5042 Chronic combined systolic (congestive) and diastolic (congestive) heart failure: Secondary | ICD-10-CM | POA: Diagnosis not present

## 2016-04-01 DIAGNOSIS — E1142 Type 2 diabetes mellitus with diabetic polyneuropathy: Secondary | ICD-10-CM | POA: Diagnosis not present

## 2016-04-01 DIAGNOSIS — I110 Hypertensive heart disease with heart failure: Secondary | ICD-10-CM | POA: Diagnosis not present

## 2016-04-01 DIAGNOSIS — R2681 Unsteadiness on feet: Secondary | ICD-10-CM | POA: Diagnosis not present

## 2016-04-01 DIAGNOSIS — I2510 Atherosclerotic heart disease of native coronary artery without angina pectoris: Secondary | ICD-10-CM | POA: Diagnosis not present

## 2016-04-02 DIAGNOSIS — E1142 Type 2 diabetes mellitus with diabetic polyneuropathy: Secondary | ICD-10-CM | POA: Diagnosis not present

## 2016-04-02 DIAGNOSIS — R2681 Unsteadiness on feet: Secondary | ICD-10-CM | POA: Diagnosis not present

## 2016-04-02 DIAGNOSIS — I110 Hypertensive heart disease with heart failure: Secondary | ICD-10-CM | POA: Diagnosis not present

## 2016-04-02 DIAGNOSIS — I2510 Atherosclerotic heart disease of native coronary artery without angina pectoris: Secondary | ICD-10-CM | POA: Diagnosis not present

## 2016-04-02 DIAGNOSIS — E1151 Type 2 diabetes mellitus with diabetic peripheral angiopathy without gangrene: Secondary | ICD-10-CM | POA: Diagnosis not present

## 2016-04-02 DIAGNOSIS — I5042 Chronic combined systolic (congestive) and diastolic (congestive) heart failure: Secondary | ICD-10-CM | POA: Diagnosis not present

## 2016-04-03 DIAGNOSIS — G459 Transient cerebral ischemic attack, unspecified: Secondary | ICD-10-CM | POA: Diagnosis not present

## 2016-04-03 DIAGNOSIS — N183 Chronic kidney disease, stage 3 (moderate): Secondary | ICD-10-CM | POA: Diagnosis not present

## 2016-04-03 DIAGNOSIS — I5032 Chronic diastolic (congestive) heart failure: Secondary | ICD-10-CM | POA: Diagnosis not present

## 2016-04-03 DIAGNOSIS — R0902 Hypoxemia: Secondary | ICD-10-CM | POA: Diagnosis not present

## 2016-04-03 DIAGNOSIS — F411 Generalized anxiety disorder: Secondary | ICD-10-CM | POA: Diagnosis not present

## 2016-04-03 DIAGNOSIS — D649 Anemia, unspecified: Secondary | ICD-10-CM | POA: Diagnosis not present

## 2016-04-03 DIAGNOSIS — I272 Other secondary pulmonary hypertension: Secondary | ICD-10-CM | POA: Diagnosis not present

## 2016-04-03 DIAGNOSIS — R110 Nausea: Secondary | ICD-10-CM | POA: Diagnosis not present

## 2016-04-03 DIAGNOSIS — I10 Essential (primary) hypertension: Secondary | ICD-10-CM | POA: Diagnosis not present

## 2016-04-03 DIAGNOSIS — E871 Hypo-osmolality and hyponatremia: Secondary | ICD-10-CM | POA: Diagnosis not present

## 2016-04-06 DIAGNOSIS — R2681 Unsteadiness on feet: Secondary | ICD-10-CM | POA: Diagnosis not present

## 2016-04-06 DIAGNOSIS — E1151 Type 2 diabetes mellitus with diabetic peripheral angiopathy without gangrene: Secondary | ICD-10-CM | POA: Diagnosis not present

## 2016-04-06 DIAGNOSIS — E1142 Type 2 diabetes mellitus with diabetic polyneuropathy: Secondary | ICD-10-CM | POA: Diagnosis not present

## 2016-04-06 DIAGNOSIS — I2510 Atherosclerotic heart disease of native coronary artery without angina pectoris: Secondary | ICD-10-CM | POA: Diagnosis not present

## 2016-04-06 DIAGNOSIS — I110 Hypertensive heart disease with heart failure: Secondary | ICD-10-CM | POA: Diagnosis not present

## 2016-04-06 DIAGNOSIS — I5042 Chronic combined systolic (congestive) and diastolic (congestive) heart failure: Secondary | ICD-10-CM | POA: Diagnosis not present

## 2016-04-07 DIAGNOSIS — I110 Hypertensive heart disease with heart failure: Secondary | ICD-10-CM | POA: Diagnosis not present

## 2016-04-07 DIAGNOSIS — E1151 Type 2 diabetes mellitus with diabetic peripheral angiopathy without gangrene: Secondary | ICD-10-CM | POA: Diagnosis not present

## 2016-04-07 DIAGNOSIS — E1142 Type 2 diabetes mellitus with diabetic polyneuropathy: Secondary | ICD-10-CM | POA: Diagnosis not present

## 2016-04-07 DIAGNOSIS — I5042 Chronic combined systolic (congestive) and diastolic (congestive) heart failure: Secondary | ICD-10-CM | POA: Diagnosis not present

## 2016-04-07 DIAGNOSIS — I2510 Atherosclerotic heart disease of native coronary artery without angina pectoris: Secondary | ICD-10-CM | POA: Diagnosis not present

## 2016-04-07 DIAGNOSIS — R2681 Unsteadiness on feet: Secondary | ICD-10-CM | POA: Diagnosis not present

## 2016-04-08 DIAGNOSIS — I2510 Atherosclerotic heart disease of native coronary artery without angina pectoris: Secondary | ICD-10-CM | POA: Diagnosis not present

## 2016-04-08 DIAGNOSIS — R2681 Unsteadiness on feet: Secondary | ICD-10-CM | POA: Diagnosis not present

## 2016-04-08 DIAGNOSIS — I110 Hypertensive heart disease with heart failure: Secondary | ICD-10-CM | POA: Diagnosis not present

## 2016-04-08 DIAGNOSIS — I5042 Chronic combined systolic (congestive) and diastolic (congestive) heart failure: Secondary | ICD-10-CM | POA: Diagnosis not present

## 2016-04-08 DIAGNOSIS — E1142 Type 2 diabetes mellitus with diabetic polyneuropathy: Secondary | ICD-10-CM | POA: Diagnosis not present

## 2016-04-08 DIAGNOSIS — E1151 Type 2 diabetes mellitus with diabetic peripheral angiopathy without gangrene: Secondary | ICD-10-CM | POA: Diagnosis not present

## 2016-04-09 DIAGNOSIS — I110 Hypertensive heart disease with heart failure: Secondary | ICD-10-CM | POA: Diagnosis not present

## 2016-04-09 DIAGNOSIS — E1142 Type 2 diabetes mellitus with diabetic polyneuropathy: Secondary | ICD-10-CM | POA: Diagnosis not present

## 2016-04-09 DIAGNOSIS — R2681 Unsteadiness on feet: Secondary | ICD-10-CM | POA: Diagnosis not present

## 2016-04-09 DIAGNOSIS — I5042 Chronic combined systolic (congestive) and diastolic (congestive) heart failure: Secondary | ICD-10-CM | POA: Diagnosis not present

## 2016-04-09 DIAGNOSIS — I2510 Atherosclerotic heart disease of native coronary artery without angina pectoris: Secondary | ICD-10-CM | POA: Diagnosis not present

## 2016-04-09 DIAGNOSIS — E1151 Type 2 diabetes mellitus with diabetic peripheral angiopathy without gangrene: Secondary | ICD-10-CM | POA: Diagnosis not present

## 2016-04-10 DIAGNOSIS — I5042 Chronic combined systolic (congestive) and diastolic (congestive) heart failure: Secondary | ICD-10-CM | POA: Diagnosis not present

## 2016-04-10 DIAGNOSIS — E1151 Type 2 diabetes mellitus with diabetic peripheral angiopathy without gangrene: Secondary | ICD-10-CM | POA: Diagnosis not present

## 2016-04-10 DIAGNOSIS — R2681 Unsteadiness on feet: Secondary | ICD-10-CM | POA: Diagnosis not present

## 2016-04-10 DIAGNOSIS — I2510 Atherosclerotic heart disease of native coronary artery without angina pectoris: Secondary | ICD-10-CM | POA: Diagnosis not present

## 2016-04-10 DIAGNOSIS — E1142 Type 2 diabetes mellitus with diabetic polyneuropathy: Secondary | ICD-10-CM | POA: Diagnosis not present

## 2016-04-10 DIAGNOSIS — I110 Hypertensive heart disease with heart failure: Secondary | ICD-10-CM | POA: Diagnosis not present

## 2016-04-14 DIAGNOSIS — I110 Hypertensive heart disease with heart failure: Secondary | ICD-10-CM | POA: Diagnosis not present

## 2016-04-14 DIAGNOSIS — I5042 Chronic combined systolic (congestive) and diastolic (congestive) heart failure: Secondary | ICD-10-CM | POA: Diagnosis not present

## 2016-04-14 DIAGNOSIS — I2510 Atherosclerotic heart disease of native coronary artery without angina pectoris: Secondary | ICD-10-CM | POA: Diagnosis not present

## 2016-04-14 DIAGNOSIS — R2681 Unsteadiness on feet: Secondary | ICD-10-CM | POA: Diagnosis not present

## 2016-04-14 DIAGNOSIS — E1151 Type 2 diabetes mellitus with diabetic peripheral angiopathy without gangrene: Secondary | ICD-10-CM | POA: Diagnosis not present

## 2016-04-14 DIAGNOSIS — E1142 Type 2 diabetes mellitus with diabetic polyneuropathy: Secondary | ICD-10-CM | POA: Diagnosis not present

## 2016-04-15 ENCOUNTER — Telehealth: Payer: Self-pay | Admitting: Hematology

## 2016-04-15 ENCOUNTER — Encounter: Payer: Self-pay | Admitting: Hematology

## 2016-04-15 DIAGNOSIS — R2681 Unsteadiness on feet: Secondary | ICD-10-CM | POA: Diagnosis not present

## 2016-04-15 DIAGNOSIS — I110 Hypertensive heart disease with heart failure: Secondary | ICD-10-CM | POA: Diagnosis not present

## 2016-04-15 DIAGNOSIS — I2510 Atherosclerotic heart disease of native coronary artery without angina pectoris: Secondary | ICD-10-CM | POA: Diagnosis not present

## 2016-04-15 DIAGNOSIS — E1142 Type 2 diabetes mellitus with diabetic polyneuropathy: Secondary | ICD-10-CM | POA: Diagnosis not present

## 2016-04-15 DIAGNOSIS — E1151 Type 2 diabetes mellitus with diabetic peripheral angiopathy without gangrene: Secondary | ICD-10-CM | POA: Diagnosis not present

## 2016-04-15 DIAGNOSIS — I5042 Chronic combined systolic (congestive) and diastolic (congestive) heart failure: Secondary | ICD-10-CM | POA: Diagnosis not present

## 2016-04-15 NOTE — Telephone Encounter (Signed)
Telephone call to the patient to schedule appt. She preferred I contact her daughter, [name]. Patient's daughter was cld. An appt was made for the pt with [facility] for 9/15 at 11am. Letter mailed to the patient and faxed to the referring.

## 2016-04-16 DIAGNOSIS — I110 Hypertensive heart disease with heart failure: Secondary | ICD-10-CM | POA: Diagnosis not present

## 2016-04-16 DIAGNOSIS — I2510 Atherosclerotic heart disease of native coronary artery without angina pectoris: Secondary | ICD-10-CM | POA: Diagnosis not present

## 2016-04-16 DIAGNOSIS — R2681 Unsteadiness on feet: Secondary | ICD-10-CM | POA: Diagnosis not present

## 2016-04-16 DIAGNOSIS — E1151 Type 2 diabetes mellitus with diabetic peripheral angiopathy without gangrene: Secondary | ICD-10-CM | POA: Diagnosis not present

## 2016-04-16 DIAGNOSIS — E1142 Type 2 diabetes mellitus with diabetic polyneuropathy: Secondary | ICD-10-CM | POA: Diagnosis not present

## 2016-04-16 DIAGNOSIS — I5042 Chronic combined systolic (congestive) and diastolic (congestive) heart failure: Secondary | ICD-10-CM | POA: Diagnosis not present

## 2016-04-17 DIAGNOSIS — I2510 Atherosclerotic heart disease of native coronary artery without angina pectoris: Secondary | ICD-10-CM | POA: Diagnosis not present

## 2016-04-17 DIAGNOSIS — I5042 Chronic combined systolic (congestive) and diastolic (congestive) heart failure: Secondary | ICD-10-CM | POA: Diagnosis not present

## 2016-04-17 DIAGNOSIS — E1142 Type 2 diabetes mellitus with diabetic polyneuropathy: Secondary | ICD-10-CM | POA: Diagnosis not present

## 2016-04-17 DIAGNOSIS — R2681 Unsteadiness on feet: Secondary | ICD-10-CM | POA: Diagnosis not present

## 2016-04-17 DIAGNOSIS — E1151 Type 2 diabetes mellitus with diabetic peripheral angiopathy without gangrene: Secondary | ICD-10-CM | POA: Diagnosis not present

## 2016-04-17 DIAGNOSIS — I110 Hypertensive heart disease with heart failure: Secondary | ICD-10-CM | POA: Diagnosis not present

## 2016-04-21 DIAGNOSIS — I2510 Atherosclerotic heart disease of native coronary artery without angina pectoris: Secondary | ICD-10-CM | POA: Diagnosis not present

## 2016-04-21 DIAGNOSIS — I110 Hypertensive heart disease with heart failure: Secondary | ICD-10-CM | POA: Diagnosis not present

## 2016-04-21 DIAGNOSIS — E1142 Type 2 diabetes mellitus with diabetic polyneuropathy: Secondary | ICD-10-CM | POA: Diagnosis not present

## 2016-04-21 DIAGNOSIS — E1151 Type 2 diabetes mellitus with diabetic peripheral angiopathy without gangrene: Secondary | ICD-10-CM | POA: Diagnosis not present

## 2016-04-21 DIAGNOSIS — R2681 Unsteadiness on feet: Secondary | ICD-10-CM | POA: Diagnosis not present

## 2016-04-21 DIAGNOSIS — I5042 Chronic combined systolic (congestive) and diastolic (congestive) heart failure: Secondary | ICD-10-CM | POA: Diagnosis not present

## 2016-04-22 DIAGNOSIS — E1151 Type 2 diabetes mellitus with diabetic peripheral angiopathy without gangrene: Secondary | ICD-10-CM | POA: Diagnosis not present

## 2016-04-22 DIAGNOSIS — I2510 Atherosclerotic heart disease of native coronary artery without angina pectoris: Secondary | ICD-10-CM | POA: Diagnosis not present

## 2016-04-22 DIAGNOSIS — I5042 Chronic combined systolic (congestive) and diastolic (congestive) heart failure: Secondary | ICD-10-CM | POA: Diagnosis not present

## 2016-04-22 DIAGNOSIS — I110 Hypertensive heart disease with heart failure: Secondary | ICD-10-CM | POA: Diagnosis not present

## 2016-04-22 DIAGNOSIS — R2681 Unsteadiness on feet: Secondary | ICD-10-CM | POA: Diagnosis not present

## 2016-04-22 DIAGNOSIS — E1142 Type 2 diabetes mellitus with diabetic polyneuropathy: Secondary | ICD-10-CM | POA: Diagnosis not present

## 2016-04-24 DIAGNOSIS — E1151 Type 2 diabetes mellitus with diabetic peripheral angiopathy without gangrene: Secondary | ICD-10-CM | POA: Diagnosis not present

## 2016-04-24 DIAGNOSIS — I110 Hypertensive heart disease with heart failure: Secondary | ICD-10-CM | POA: Diagnosis not present

## 2016-04-24 DIAGNOSIS — E1142 Type 2 diabetes mellitus with diabetic polyneuropathy: Secondary | ICD-10-CM | POA: Diagnosis not present

## 2016-04-24 DIAGNOSIS — I2510 Atherosclerotic heart disease of native coronary artery without angina pectoris: Secondary | ICD-10-CM | POA: Diagnosis not present

## 2016-04-24 DIAGNOSIS — I5042 Chronic combined systolic (congestive) and diastolic (congestive) heart failure: Secondary | ICD-10-CM | POA: Diagnosis not present

## 2016-04-24 DIAGNOSIS — R2681 Unsteadiness on feet: Secondary | ICD-10-CM | POA: Diagnosis not present

## 2016-04-28 DIAGNOSIS — I2510 Atherosclerotic heart disease of native coronary artery without angina pectoris: Secondary | ICD-10-CM | POA: Diagnosis not present

## 2016-04-28 DIAGNOSIS — R2681 Unsteadiness on feet: Secondary | ICD-10-CM | POA: Diagnosis not present

## 2016-04-28 DIAGNOSIS — I5042 Chronic combined systolic (congestive) and diastolic (congestive) heart failure: Secondary | ICD-10-CM | POA: Diagnosis not present

## 2016-04-28 DIAGNOSIS — I110 Hypertensive heart disease with heart failure: Secondary | ICD-10-CM | POA: Diagnosis not present

## 2016-04-28 DIAGNOSIS — E1151 Type 2 diabetes mellitus with diabetic peripheral angiopathy without gangrene: Secondary | ICD-10-CM | POA: Diagnosis not present

## 2016-04-28 DIAGNOSIS — E1142 Type 2 diabetes mellitus with diabetic polyneuropathy: Secondary | ICD-10-CM | POA: Diagnosis not present

## 2016-04-29 DIAGNOSIS — I2510 Atherosclerotic heart disease of native coronary artery without angina pectoris: Secondary | ICD-10-CM | POA: Diagnosis not present

## 2016-04-29 DIAGNOSIS — I5042 Chronic combined systolic (congestive) and diastolic (congestive) heart failure: Secondary | ICD-10-CM | POA: Diagnosis not present

## 2016-04-29 DIAGNOSIS — R2681 Unsteadiness on feet: Secondary | ICD-10-CM | POA: Diagnosis not present

## 2016-04-29 DIAGNOSIS — E1151 Type 2 diabetes mellitus with diabetic peripheral angiopathy without gangrene: Secondary | ICD-10-CM | POA: Diagnosis not present

## 2016-04-29 DIAGNOSIS — E1142 Type 2 diabetes mellitus with diabetic polyneuropathy: Secondary | ICD-10-CM | POA: Diagnosis not present

## 2016-04-29 DIAGNOSIS — I110 Hypertensive heart disease with heart failure: Secondary | ICD-10-CM | POA: Diagnosis not present

## 2016-04-30 DIAGNOSIS — I5042 Chronic combined systolic (congestive) and diastolic (congestive) heart failure: Secondary | ICD-10-CM | POA: Diagnosis not present

## 2016-04-30 DIAGNOSIS — E1142 Type 2 diabetes mellitus with diabetic polyneuropathy: Secondary | ICD-10-CM | POA: Diagnosis not present

## 2016-04-30 DIAGNOSIS — I110 Hypertensive heart disease with heart failure: Secondary | ICD-10-CM | POA: Diagnosis not present

## 2016-04-30 DIAGNOSIS — R2681 Unsteadiness on feet: Secondary | ICD-10-CM | POA: Diagnosis not present

## 2016-04-30 DIAGNOSIS — E1151 Type 2 diabetes mellitus with diabetic peripheral angiopathy without gangrene: Secondary | ICD-10-CM | POA: Diagnosis not present

## 2016-04-30 DIAGNOSIS — I2510 Atherosclerotic heart disease of native coronary artery without angina pectoris: Secondary | ICD-10-CM | POA: Diagnosis not present

## 2016-05-01 DIAGNOSIS — F411 Generalized anxiety disorder: Secondary | ICD-10-CM | POA: Diagnosis not present

## 2016-05-01 DIAGNOSIS — Z23 Encounter for immunization: Secondary | ICD-10-CM | POA: Diagnosis not present

## 2016-05-01 DIAGNOSIS — E871 Hypo-osmolality and hyponatremia: Secondary | ICD-10-CM | POA: Diagnosis not present

## 2016-05-01 DIAGNOSIS — I499 Cardiac arrhythmia, unspecified: Secondary | ICD-10-CM | POA: Diagnosis not present

## 2016-05-01 DIAGNOSIS — L659 Nonscarring hair loss, unspecified: Secondary | ICD-10-CM | POA: Diagnosis not present

## 2016-05-01 DIAGNOSIS — I5032 Chronic diastolic (congestive) heart failure: Secondary | ICD-10-CM | POA: Diagnosis not present

## 2016-05-01 DIAGNOSIS — D649 Anemia, unspecified: Secondary | ICD-10-CM | POA: Diagnosis not present

## 2016-05-01 DIAGNOSIS — R0902 Hypoxemia: Secondary | ICD-10-CM | POA: Diagnosis not present

## 2016-05-01 DIAGNOSIS — I272 Other secondary pulmonary hypertension: Secondary | ICD-10-CM | POA: Diagnosis not present

## 2016-05-01 DIAGNOSIS — R769 Abnormal immunological finding in serum, unspecified: Secondary | ICD-10-CM | POA: Diagnosis not present

## 2016-05-05 DIAGNOSIS — R2681 Unsteadiness on feet: Secondary | ICD-10-CM | POA: Diagnosis not present

## 2016-05-05 DIAGNOSIS — I110 Hypertensive heart disease with heart failure: Secondary | ICD-10-CM | POA: Diagnosis not present

## 2016-05-05 DIAGNOSIS — I2510 Atherosclerotic heart disease of native coronary artery without angina pectoris: Secondary | ICD-10-CM | POA: Diagnosis not present

## 2016-05-05 DIAGNOSIS — E1151 Type 2 diabetes mellitus with diabetic peripheral angiopathy without gangrene: Secondary | ICD-10-CM | POA: Diagnosis not present

## 2016-05-05 DIAGNOSIS — I5042 Chronic combined systolic (congestive) and diastolic (congestive) heart failure: Secondary | ICD-10-CM | POA: Diagnosis not present

## 2016-05-05 DIAGNOSIS — E1142 Type 2 diabetes mellitus with diabetic polyneuropathy: Secondary | ICD-10-CM | POA: Diagnosis not present

## 2016-05-08 ENCOUNTER — Telehealth: Payer: Self-pay | Admitting: Hematology

## 2016-05-08 ENCOUNTER — Ambulatory Visit (HOSPITAL_BASED_OUTPATIENT_CLINIC_OR_DEPARTMENT_OTHER): Payer: Medicare Other | Admitting: Hematology

## 2016-05-08 ENCOUNTER — Encounter: Payer: Self-pay | Admitting: Hematology

## 2016-05-08 ENCOUNTER — Ambulatory Visit (HOSPITAL_BASED_OUTPATIENT_CLINIC_OR_DEPARTMENT_OTHER): Payer: Medicare Other

## 2016-05-08 VITALS — BP 147/76 | HR 68 | Temp 98.4°F | Resp 18 | Ht 63.0 in | Wt 135.8 lb

## 2016-05-08 DIAGNOSIS — Z853 Personal history of malignant neoplasm of breast: Secondary | ICD-10-CM | POA: Diagnosis not present

## 2016-05-08 DIAGNOSIS — I2510 Atherosclerotic heart disease of native coronary artery without angina pectoris: Secondary | ICD-10-CM | POA: Diagnosis not present

## 2016-05-08 DIAGNOSIS — R2681 Unsteadiness on feet: Secondary | ICD-10-CM | POA: Diagnosis not present

## 2016-05-08 DIAGNOSIS — C50911 Malignant neoplasm of unspecified site of right female breast: Secondary | ICD-10-CM

## 2016-05-08 DIAGNOSIS — E538 Deficiency of other specified B group vitamins: Secondary | ICD-10-CM

## 2016-05-08 DIAGNOSIS — R778 Other specified abnormalities of plasma proteins: Secondary | ICD-10-CM

## 2016-05-08 DIAGNOSIS — D649 Anemia, unspecified: Secondary | ICD-10-CM

## 2016-05-08 DIAGNOSIS — I5042 Chronic combined systolic (congestive) and diastolic (congestive) heart failure: Secondary | ICD-10-CM | POA: Diagnosis not present

## 2016-05-08 DIAGNOSIS — D472 Monoclonal gammopathy: Secondary | ICD-10-CM

## 2016-05-08 DIAGNOSIS — C50912 Malignant neoplasm of unspecified site of left female breast: Secondary | ICD-10-CM

## 2016-05-08 DIAGNOSIS — E1142 Type 2 diabetes mellitus with diabetic polyneuropathy: Secondary | ICD-10-CM | POA: Diagnosis not present

## 2016-05-08 DIAGNOSIS — R769 Abnormal immunological finding in serum, unspecified: Secondary | ICD-10-CM | POA: Diagnosis not present

## 2016-05-08 DIAGNOSIS — E1151 Type 2 diabetes mellitus with diabetic peripheral angiopathy without gangrene: Secondary | ICD-10-CM | POA: Diagnosis not present

## 2016-05-08 DIAGNOSIS — I110 Hypertensive heart disease with heart failure: Secondary | ICD-10-CM | POA: Diagnosis not present

## 2016-05-08 LAB — CBC & DIFF AND RETIC
BASO%: 0.2 % (ref 0.0–2.0)
Basophils Absolute: 0 10*3/uL (ref 0.0–0.1)
EOS%: 2.4 % (ref 0.0–7.0)
Eosinophils Absolute: 0.2 10*3/uL (ref 0.0–0.5)
HCT: 37.1 % (ref 34.8–46.6)
HGB: 11.7 g/dL (ref 11.6–15.9)
Immature Retic Fract: 6.1 % (ref 1.60–10.00)
LYMPH%: 14 % (ref 14.0–49.7)
MCH: 26.7 pg (ref 25.1–34.0)
MCHC: 31.5 g/dL (ref 31.5–36.0)
MCV: 84.5 fL (ref 79.5–101.0)
MONO#: 0.5 10*3/uL (ref 0.1–0.9)
MONO%: 6.4 % (ref 0.0–14.0)
NEUT#: 6.2 10*3/uL (ref 1.5–6.5)
NEUT%: 77 % — ABNORMAL HIGH (ref 38.4–76.8)
Platelets: 216 10*3/uL (ref 145–400)
RBC: 4.39 10*6/uL (ref 3.70–5.45)
RDW: 16.5 % — ABNORMAL HIGH (ref 11.2–14.5)
Retic %: 1.4 % (ref 0.70–2.10)
Retic Ct Abs: 61.46 10*3/uL (ref 33.70–90.70)
WBC: 8.1 10*3/uL (ref 3.9–10.3)
lymph#: 1.1 10*3/uL (ref 0.9–3.3)

## 2016-05-08 LAB — COMPREHENSIVE METABOLIC PANEL
ALT: 19 U/L (ref 0–55)
AST: 18 U/L (ref 5–34)
Albumin: 3.8 g/dL (ref 3.5–5.0)
Alkaline Phosphatase: 69 U/L (ref 40–150)
Anion Gap: 9 mEq/L (ref 3–11)
BUN: 12.8 mg/dL (ref 7.0–26.0)
CO2: 29 mEq/L (ref 22–29)
Calcium: 9.8 mg/dL (ref 8.4–10.4)
Chloride: 99 mEq/L (ref 98–109)
Creatinine: 0.8 mg/dL (ref 0.6–1.1)
EGFR: 73 mL/min/{1.73_m2} — ABNORMAL LOW (ref >90)
Glucose: 90 mg/dl (ref 70–140)
Potassium: 4.5 mEq/L (ref 3.5–5.1)
Sodium: 137 mEq/L (ref 136–145)
Total Bilirubin: 0.67 mg/dL (ref 0.20–1.20)
Total Protein: 7.3 g/dL (ref 6.4–8.3)

## 2016-05-08 LAB — CHCC SMEAR

## 2016-05-08 LAB — IRON AND TIBC
%SAT: 14 % — ABNORMAL LOW (ref 21–57)
Iron: 52 ug/dL (ref 41–142)
TIBC: 361 ug/dL (ref 236–444)
UIBC: 309 ug/dL (ref 120–384)

## 2016-05-08 LAB — LACTATE DEHYDROGENASE: LDH: 209 U/L (ref 125–245)

## 2016-05-08 LAB — FERRITIN: Ferritin: 137 ng/ml (ref 9–269)

## 2016-05-08 NOTE — Progress Notes (Signed)
[name]    HEMATOLOGY/ONCOLOGY CONSULTATION NOTE  Date of Service: 05/08/2016  Patient Care Team: Kathyrn Lass, MD as PCP - General (Family Medicine) Vernie Shanks, MD as Consulting Physician (Family Medicine) Jerrell Belfast, MD as Consulting Physician (Otolaryngology) Calvert Cantor, MD as Consulting Physician (Ophthalmology) Ronald Lobo, MD as Consulting Physician (Gastroenterology)  CHIEF COMPLAINTS/PURPOSE OF CONSULTATION:  IgG kappa MGUS Anemia  HISTORY OF PRESENTING ILLNESS:   Helen [name] is a wonderful 82 y.o. female with a fantastic sense of humor who has been referred to [place] by Dr .[name], MD for evaluation and management of newly noted IgG kappa monoclonal gammopathy and Anemia.  Patient has a history of multiple medical comorbidities including hypertension, dyslipidemia, coronary artery disease, peripheral vascular disease, TIAs and has had hospitalization in April 2017 for altered mental status which was thought to be due to an acute toxic metabolic encephalopathy from hyponatremia/urinary tract infection with possibility of a TIA.  MRI showed no acute stroke.  Hyponatremia was thought to be related to SIADH from Zoloft and Depakote.  Cortisol levels and TSH were within normal limits.  Chest x-ray was negative for pneumonia. Patient was also subsequently hospitalized again in July 2017 for dizziness nausea and decreased sodium of 122.  Labs are suggestive of dehydration.  Sodium levels improved with oral salt replacement and Lasix and fluid restriction.  She was also noted to have chronic systolic and diastolic CHF with ejection fraction of 40% with grade 2 diastolic dysfunction and moderate pulmonary hypertension which was apparently new to her.  This time she was also noted to have anemia with hemoglobin drop from about 12 down to 9.5.  Anemia workup at the time showed a ferritin level of 331, normal B12 and folate, iron saturation of 10% suggestive of anemia chronic  disease.  Patient subsequently followed with her primary care physician Dr. Kathyrn Lass and have labs on 04/03/2016 that showed some improvement in her hemoglobin to 10.7 with an MCV of 85 the Lisa count of 7.9k and platelets of 408k.  She has an SPEP and UPEP done presumably as a part of her anemia workup.  SPEP showed no observable M spike.  Random UPEP done showed no evidence of M spike with minimal urinary total protein.  The patient's immunofixation electrophoresis -apparently showed faint IgG kappa monoclonal protein. Sedimentation rate was within normal limits at 16 and ferritin was 123.5  This is resulted in her current referral for further evaluation of MGUS.  Patient reports a history of breast cancer.  She was was diagnosed at age 24 with stage III right-sided breast cancer in 1988 and was treated with right sided mastectomy with lymph node dissection.  She reports 3-4 lymph nodes were positive.  She received adjuvant chemotherapy including Adriamycin and 5-fluorouracil per her report and breast and axillary radiation.  She subsequently was noted to have left-sided breast cancer stage I and had a mastectomy with radiation.  Patient reports no adjuvant endocrine therapy was recommended. The current symptoms suggestive of breast cancer recurrence at this time.  No weight loss, no night sweats no fevers no chills no chest wall new skin lesions or rashes.  Still notes some intermittent orthostatic dizziness.   MEDICAL HISTORY:  Past Medical History:  Diagnosis Date  . Anemia   . CAD (coronary artery disease)   . Cancer [facility])    Breast cancer  . Diabetes mellitus   . Hypercholesteremia   . Hypertension   . Peripheral vascular disease ([place])   .  Stroke [facility])    reports TIA 3-4 weeks ago  -Chronic kidney disease stage II-III -GERD 02/2014 EGD showed Barrett esophagus metaplasia without dysplasia -History of shingles History of ocular migraines Osteopenia Carotid artery  stenosis   Patient Active Problem List   Diagnosis Date Noted  . Anemia of chronic disease 03/24/2016  . Chronic combined systolic and diastolic congestive heart failure ([place]) 03/24/2016  . Acute respiratory failure with hypoxia ([place]) 03/24/2016  . Scarring of lung following radiation- RLL 03/24/2016  . Essential hypertension   . Acute encephalopathy 12/13/2015  . Hyponatremia 12/13/2015  . Hypokalemia 12/13/2015  . UTI (lower urinary tract infection) 12/13/2015  . Leukocytosis 12/13/2015  . Hypercholesteremia   . Hypertension   . Diabetes mellitus without complication ([place])   . Stroke ([place])   . CAD (coronary artery disease)   . Peripheral vascular disease ([place])   . Carotid stenosis 03/13/2015    SURGICAL HISTORY: Past Surgical History:  Procedure Laterality Date  . ABDOMINAL HYSTERECTOMY    . APPENDECTOMY  1951  . [place]  . CHOLECYSTECTOMY  2003   By Dr. Excell Seltzer  . EYE SURGERY Bilateral 2009   Cataract surgery  . FRACTURE SURGERY Right    ORIF   by Dr. Burney Gauze  . HUMERUS FRACTURE SURGERY    . MASTECTOMY Bilateral   . NASAL SEPTUM SURGERY  1977   Dr. Ernesto Rutherford  . TOENAIL EXCISION      SOCIAL HISTORY: Social History   Social History  . Marital status: Widowed    Spouse name: N/A  . Number of children: N/A  . Years of education: N/A   Occupational History  . Not on file.   Social History Main Topics  . Smoking status: Former Smoker    Quit date: 08/24/1978  . Smokeless tobacco: Never Used  . Alcohol use No  . Drug use: No  . Sexual activity: Not on file   Other Topics Concern  . Not on file   Social History Narrative  . No narrative on file  currently lives at [facility] senior living facility  FAMILY HISTORY: Family History  Problem Relation Age of Onset  . Cancer Mother   . Heart disease Father   . Heart attack Father   . Diabetes Brother   . Heart disease Brother   . Hyperlipidemia Brother     ALLERGIES:  is  allergic to morphine and related; other; sulfa antibiotics; and nickel.  MEDICATIONS:  Current Outpatient Prescriptions  Medication Sig Dispense Refill  . acetaminophen (TYLENOL) 500 MG tablet Take 1,000 mg by mouth every 6 (six) hours as needed for moderate pain or headache.    [name] atenolol (TENORMIN) 50 MG tablet Take 100 mg by mouth daily.     [name] atorvastatin (LIPITOR) 80 MG tablet Take 80 mg by mouth at bedtime.     [name] BIOTIN PO Take 1 tablet by mouth daily.    . busPIRone (BUSPAR) 5 MG tablet Take 1 tablet (5 mg total) by mouth 2 (two) times daily. 60 tablet 2  . cholecalciferol (VITAMIN D) 1000 units tablet Take 2,000 Units by mouth every evening.    . clopidogrel (PLAVIX) 75 MG tablet Take 75 mg by mouth daily.    . fluticasone (FLONASE) 50 MCG/ACT nasal spray Place into both nostrils as needed for allergies or rhinitis.    [name] FOLIC ACID PO Take 1 tablet by mouth daily.    . Melatonin 5 MG TABS Take 10 mg  by mouth at bedtime as needed (sleep).    . Multiple Vitamin (MULTIVITAMIN WITH MINERALS) TABS tablet Take 1 tablet by mouth daily.     [name] omeprazole (PRILOSEC) 20 MG capsule Take 20 mg by mouth every morning.     . ondansetron (ZOFRAN) 4 MG tablet Take 4 mg by mouth every 8 (eight) hours as needed for nausea or vomiting.    . sucralfate (CARAFATE) 1 g tablet Take 1 g by mouth 4 (four) times daily -  with meals and at bedtime.    . vitamin C (ASCORBIC ACID) 500 MG tablet Take 1,000 mg by mouth daily.    . ramipril (ALTACE) 10 MG capsule Take 20 mg by mouth daily.      No current facility-administered medications for this visit.     REVIEW OF SYSTEMS:    10 Point review of Systems was done is negative except as noted above.  PHYSICAL EXAMINATION: ECOG PERFORMANCE STATUS: 2 - Symptomatic, <50% confined to bed  . Vitals:   05/08/16 1102  BP: (!) 147/76  Pulse: 68  Resp: 18  Temp: 98.4 F (36.9 C)   Filed Weights   05/08/16 1102  Weight: 135 lb 12.8 oz (61.6 kg)   .Body  mass index is 24.06 kg/m.  GENERAL:alert, in no acute distress and comfortable SKIN: skin color, texture, turgor are normal, no rashes or significant lesions EYES: normal, conjunctiva are pink and non-injected, sclera clear OROPHARYNX:no exudate, no erythema and lips, buccal mucosa, and tongue normal  NECK: supple, no JVD, thyroid normal size, non-tender, without nodularity LYMPH:  no palpable lymphadenopathy in the cervical, axillary or inguinal LUNGS: clear to auscultation with normal respiratory effort BREAST: bilateral old healed surgical scars of previous mastectomy.  No new skin lesions or lumps.  No palpable regional lymphadenopathy. HEART: regular rate & rhythm,  no murmurs and no lower extremity edema ABDOMEN: abdomen soft, non-tender, normoactive bowel sounds , no palpable hepatosplenomegaly Musculoskeletal: bilateral 1+ pitting pedal edema PSYCH: alert & oriented x 3 with fluent speech NEURO: no focal motor/sensory deficits  LABORATORY DATA:  I have reviewed the data as listed  . CBC Latest Ref Rng & Units 05/08/2016 03/22/2016 03/19/2016  WBC 3.9 - 10.3 10e3/uL 8.1 10.2 8.3  Hemoglobin 11.6 - 15.9 g/dL 11.7 9.5(L) 9.2(L)  Hematocrit 34.8 - 46.6 % 37.1 28.7(L) 27.5(L)  Platelets 145 - 400 10e3/uL 216 386 334   . CBC    Component Value Date/Time   WBC 8.1 05/08/2016 1244   WBC 10.2 03/22/2016 1536   RBC 4.39 05/08/2016 1244   RBC 3.31 (L) 03/22/2016 1536   RBC 3.31 (L) 03/22/2016 1536   HGB 11.7 05/08/2016 1244   HCT 37.1 05/08/2016 1244   PLT 216 05/08/2016 1244   MCV 84.5 05/08/2016 1244   MCH 26.7 05/08/2016 1244   MCH 28.7 03/22/2016 1536   MCHC 31.5 05/08/2016 1244   MCHC 33.1 03/22/2016 1536   RDW 16.5 (H) 05/08/2016 1244   LYMPHSABS 1.1 05/08/2016 1244   MONOABS 0.5 05/08/2016 1244   EOSABS 0.2 05/08/2016 1244   BASOSABS 0.0 05/08/2016 1244    . CMP Latest Ref Rng & Units 05/08/2016 03/24/2016 03/23/2016  Glucose 70 - 140 mg/dl 90 117(H) 157(H)  BUN 7.0  - 26.0 mg/dL 12.8 11 11   Creatinine 0.6 - 1.1 mg/dL 0.8 0.61 0.73  Sodium 136 - 145 mEq/L 137 133(L) 130(L)  Potassium 3.5 - 5.1 mEq/L 4.5 3.9 4.4  Chloride 101 - 111 mmol/L - 94(L)  93(L)  CO2 22 - 29 mEq/L 29 31 30   Calcium 8.4 - 10.4 mg/dL 9.8 8.5(L) 8.1(L)  Total Protein 6.4 - 8.3 g/dL 7.3 - -  Total Bilirubin 0.20 - 1.20 mg/dL 0.67 - -  Alkaline Phos 40 - 150 U/L 69 - -  AST 5 - 34 U/L 18 - -  ALT 0 - 55 U/L 19 - -   Component     Latest Ref Rng & Units 05/08/2016  Iron     41 - 142 ug/dL 52  TIBC     236 - 444 ug/dL 361  UIBC     120 - 384 ug/dL 309  %SAT     21 - 57 % 14 (L)  LDH     125 - 245 U/L 209  Ferritin     9 - 269 ng/ml 137    RADIOGRAPHIC STUDIES: I have personally reviewed the radiological images as listed and agreed with the findings in the report. No results found.  ASSESSMENT & PLAN:   82 year old Caucasian female with multiple medical comorbidities noted above with  #1 IgG Kappa monoclonal gammopathy of undetermined significance noted on immunofixation electrophoresis. #2 normocytic anemia Patient had an SPEP which showed no clear M spike.  UPEP showed no significant total urinary protein  And had negative M spike. Sedimentation rate within normal limits at 16 LDH today within normal limits Patient did have new anemia noted on hospitalization in July 2017.  Workup appears to be consistent with anemia of chronic disease. No recent change in creatinine and her creatinine levels are fairly normal.  No significant proteinuria.  No hypercalcemia.  No new focal bone pains.. However her hemoglobin on labs today appears to have new normalized with a hemoglobin of 11.7 with an MCV of 84.7.  Normalization of her hemoglobin makes the possibility of multiple myeloma for MDS related to her previous chemotherapy for breast cancer less likely. Plan -we should repeat an SPEP with quantitative immunoglobulins and IFE today to check for persistence of MGUS.  Patient  might have had mild monoclonal gammopathy in relation to her recent admission for urinary tract infection with reactive monoclonal gammopathy. -Will also get a serum kappa and lambda free light chains -Reasonable to replace iron with iron polysaccharide to maintain her ferritin levels closer to 250 in the setting of anemia of chronic disease and lower iron saturations to treat functional iron deficiency. -no role for EPO at this time given near-normal hemoglobin levels but might need to be considered if her hemoglobin drops to below 9. -It would also be appropriate to have the patient on B complex replacement 1 tablet by mouth daily.  #3.Breast Cancer  history of stage III right-sided breast cancer status post mastectomy as well as a lymph node dissection, adjuvant chemotherapy and radiation in 1988 History of a left-sided stage I breast cancer status post mastectomy and radiation. Plan -No clinical evidence of disease recurrence at this time. -Continued followup with primary care physician  #4 Patient Active Problem List   Diagnosis Date Noted  . Anemia of chronic disease 03/24/2016  . Chronic combined systolic and diastolic congestive heart failure ([place]) 03/24/2016  . Acute respiratory failure with hypoxia ([place]) 03/24/2016  . Scarring of lung following radiation- RLL 03/24/2016  . Essential hypertension   . Acute encephalopathy 12/13/2015  . Hyponatremia 12/13/2015  . Hypokalemia 12/13/2015  . UTI (lower urinary tract infection) 12/13/2015  . Leukocytosis 12/13/2015  . Hypercholesteremia   . Hypertension   .  Diabetes mellitus without complication ([place])   . Stroke ([place])   . CAD (coronary artery disease)   . Peripheral vascular disease ([place])   . Carotid stenosis 03/13/2015   -continue followup with Dr. Kathyrn Lass her primary care physician for continued management of chronic medical comorbidities.  RTC with Dr Irene Limbo in 2 weeks to discuss lab results and plan further w/u as  needed.  All of the patients questions were answered with apparent satisfaction. The patient knows to call the clinic with any problems, questions or concerns.  I spent 60 minutes counseling the patient face to face. The total time spent in the appointment was 70 minutes and more than 50% was on counseling and direct patient cares.    Sullivan Lone MD [place] AAHIVMS [facility] Hematology/Oncology Physician [facility]  (Office):       [phone] (Work cell):  [phone] (Fax):           [phone]  05/08/2016 11:29 AM

## 2016-05-08 NOTE — Telephone Encounter (Signed)
Avs report and appointment schedule given to patient, per 05/08/16 los. °

## 2016-05-09 LAB — VITAMIN B12: Vitamin B12: 448 pg/mL (ref 211–946)

## 2016-05-09 LAB — SEDIMENTATION RATE: Sedimentation Rate-Westergren: 6 mm/hr (ref 0–40)

## 2016-05-09 LAB — COPPER, SERUM: Copper: 112 ug/dL (ref 72–166)

## 2016-05-09 LAB — HAPTOGLOBIN: Haptoglobin: 148 mg/dL (ref 34–200)

## 2016-05-09 LAB — ERYTHROPOIETIN: Erythropoietin: 24.6 m[IU]/mL — ABNORMAL HIGH (ref 2.6–18.5)

## 2016-05-11 LAB — KAPPA/LAMBDA LIGHT CHAINS
Ig Kappa Free Light Chain: 17.8 mg/L (ref 3.3–19.4)
Ig Lambda Free Light Chain: 18.8 mg/L (ref 5.7–26.3)
Kappa/Lambda FluidC Ratio: 0.95 (ref 0.26–1.65)

## 2016-05-12 DIAGNOSIS — I2510 Atherosclerotic heart disease of native coronary artery without angina pectoris: Secondary | ICD-10-CM | POA: Diagnosis not present

## 2016-05-12 DIAGNOSIS — E1142 Type 2 diabetes mellitus with diabetic polyneuropathy: Secondary | ICD-10-CM | POA: Diagnosis not present

## 2016-05-12 DIAGNOSIS — E1151 Type 2 diabetes mellitus with diabetic peripheral angiopathy without gangrene: Secondary | ICD-10-CM | POA: Diagnosis not present

## 2016-05-12 DIAGNOSIS — I5042 Chronic combined systolic (congestive) and diastolic (congestive) heart failure: Secondary | ICD-10-CM | POA: Diagnosis not present

## 2016-05-12 DIAGNOSIS — I110 Hypertensive heart disease with heart failure: Secondary | ICD-10-CM | POA: Diagnosis not present

## 2016-05-12 DIAGNOSIS — R2681 Unsteadiness on feet: Secondary | ICD-10-CM | POA: Diagnosis not present

## 2016-05-13 DIAGNOSIS — I2510 Atherosclerotic heart disease of native coronary artery without angina pectoris: Secondary | ICD-10-CM | POA: Diagnosis not present

## 2016-05-13 DIAGNOSIS — I110 Hypertensive heart disease with heart failure: Secondary | ICD-10-CM | POA: Diagnosis not present

## 2016-05-13 DIAGNOSIS — E1151 Type 2 diabetes mellitus with diabetic peripheral angiopathy without gangrene: Secondary | ICD-10-CM | POA: Diagnosis not present

## 2016-05-13 DIAGNOSIS — I5042 Chronic combined systolic (congestive) and diastolic (congestive) heart failure: Secondary | ICD-10-CM | POA: Diagnosis not present

## 2016-05-13 DIAGNOSIS — R2681 Unsteadiness on feet: Secondary | ICD-10-CM | POA: Diagnosis not present

## 2016-05-13 DIAGNOSIS — E1142 Type 2 diabetes mellitus with diabetic polyneuropathy: Secondary | ICD-10-CM | POA: Diagnosis not present

## 2016-05-13 LAB — MULTIPLE MYELOMA PANEL, SERUM
Albumin SerPl Elph-Mcnc: 3.6 g/dL (ref 2.9–4.4)
Albumin/Glob SerPl: 1.2 (ref 0.7–1.7)
Alpha 1: 0.3 g/dL (ref 0.0–0.4)
Alpha2 Glob SerPl Elph-Mcnc: 0.9 g/dL (ref 0.4–1.0)
B-Globulin SerPl Elph-Mcnc: 1 g/dL (ref 0.7–1.3)
Gamma Glob SerPl Elph-Mcnc: 0.8 g/dL (ref 0.4–1.8)
Globulin, Total: 3.1 g/dL (ref 2.2–3.9)
IgA, Qn, Serum: 186 mg/dL (ref 64–422)
IgG, Qn, Serum: 878 mg/dL (ref 700–1600)
IgM, Qn, Serum: 77 mg/dL (ref 26–217)
Total Protein: 6.7 g/dL (ref 6.0–8.5)

## 2016-05-14 DIAGNOSIS — I2510 Atherosclerotic heart disease of native coronary artery without angina pectoris: Secondary | ICD-10-CM | POA: Diagnosis not present

## 2016-05-14 DIAGNOSIS — I110 Hypertensive heart disease with heart failure: Secondary | ICD-10-CM | POA: Diagnosis not present

## 2016-05-14 DIAGNOSIS — R2681 Unsteadiness on feet: Secondary | ICD-10-CM | POA: Diagnosis not present

## 2016-05-14 DIAGNOSIS — I5042 Chronic combined systolic (congestive) and diastolic (congestive) heart failure: Secondary | ICD-10-CM | POA: Diagnosis not present

## 2016-05-14 DIAGNOSIS — E1151 Type 2 diabetes mellitus with diabetic peripheral angiopathy without gangrene: Secondary | ICD-10-CM | POA: Diagnosis not present

## 2016-05-14 DIAGNOSIS — E1142 Type 2 diabetes mellitus with diabetic polyneuropathy: Secondary | ICD-10-CM | POA: Diagnosis not present

## 2016-05-19 DIAGNOSIS — E1151 Type 2 diabetes mellitus with diabetic peripheral angiopathy without gangrene: Secondary | ICD-10-CM | POA: Diagnosis not present

## 2016-05-19 DIAGNOSIS — I110 Hypertensive heart disease with heart failure: Secondary | ICD-10-CM | POA: Diagnosis not present

## 2016-05-19 DIAGNOSIS — R2681 Unsteadiness on feet: Secondary | ICD-10-CM | POA: Diagnosis not present

## 2016-05-19 DIAGNOSIS — I5042 Chronic combined systolic (congestive) and diastolic (congestive) heart failure: Secondary | ICD-10-CM | POA: Diagnosis not present

## 2016-05-19 DIAGNOSIS — E1142 Type 2 diabetes mellitus with diabetic polyneuropathy: Secondary | ICD-10-CM | POA: Diagnosis not present

## 2016-05-19 DIAGNOSIS — I2510 Atherosclerotic heart disease of native coronary artery without angina pectoris: Secondary | ICD-10-CM | POA: Diagnosis not present

## 2016-05-21 DIAGNOSIS — E1151 Type 2 diabetes mellitus with diabetic peripheral angiopathy without gangrene: Secondary | ICD-10-CM | POA: Diagnosis not present

## 2016-05-21 DIAGNOSIS — I110 Hypertensive heart disease with heart failure: Secondary | ICD-10-CM | POA: Diagnosis not present

## 2016-05-21 DIAGNOSIS — E1142 Type 2 diabetes mellitus with diabetic polyneuropathy: Secondary | ICD-10-CM | POA: Diagnosis not present

## 2016-05-21 DIAGNOSIS — I5042 Chronic combined systolic (congestive) and diastolic (congestive) heart failure: Secondary | ICD-10-CM | POA: Diagnosis not present

## 2016-05-21 DIAGNOSIS — R2681 Unsteadiness on feet: Secondary | ICD-10-CM | POA: Diagnosis not present

## 2016-05-21 DIAGNOSIS — I2510 Atherosclerotic heart disease of native coronary artery without angina pectoris: Secondary | ICD-10-CM | POA: Diagnosis not present

## 2016-06-01 ENCOUNTER — Ambulatory Visit (HOSPITAL_BASED_OUTPATIENT_CLINIC_OR_DEPARTMENT_OTHER): Payer: Medicare Other | Admitting: Hematology

## 2016-06-01 VITALS — BP 140/66 | HR 68 | Temp 98.0°F | Resp 18 | Wt 139.2 lb

## 2016-06-01 DIAGNOSIS — Z853 Personal history of malignant neoplasm of breast: Secondary | ICD-10-CM

## 2016-06-01 DIAGNOSIS — D472 Monoclonal gammopathy: Secondary | ICD-10-CM | POA: Diagnosis not present

## 2016-06-01 DIAGNOSIS — R778 Other specified abnormalities of plasma proteins: Secondary | ICD-10-CM

## 2016-06-01 DIAGNOSIS — D649 Anemia, unspecified: Secondary | ICD-10-CM | POA: Diagnosis not present

## 2016-06-12 DIAGNOSIS — I270 Primary pulmonary hypertension: Secondary | ICD-10-CM | POA: Diagnosis not present

## 2016-06-12 DIAGNOSIS — F411 Generalized anxiety disorder: Secondary | ICD-10-CM | POA: Diagnosis not present

## 2016-06-12 DIAGNOSIS — K2960 Other gastritis without bleeding: Secondary | ICD-10-CM | POA: Diagnosis not present

## 2016-06-12 DIAGNOSIS — I498 Other specified cardiac arrhythmias: Secondary | ICD-10-CM | POA: Diagnosis not present

## 2016-06-12 DIAGNOSIS — I10 Essential (primary) hypertension: Secondary | ICD-10-CM | POA: Diagnosis not present

## 2016-06-12 DIAGNOSIS — R110 Nausea: Secondary | ICD-10-CM | POA: Diagnosis not present

## 2016-06-12 DIAGNOSIS — E782 Mixed hyperlipidemia: Secondary | ICD-10-CM | POA: Diagnosis not present

## 2016-06-12 DIAGNOSIS — R0902 Hypoxemia: Secondary | ICD-10-CM | POA: Diagnosis not present

## 2016-07-19 IMAGING — MR MR HEAD W/O CM
9 of 10 series · 37 of 48 positions shown · non-contrast
Comparison: Prior head CT from earlier same day.

CLINICAL DATA: Initial evaluation for acute encephalopathy.
Possible transient aphasia.

EXAM:
MRI HEAD WITHOUT CONTRAST
TECHNIQUE: Multiplanar, multiecho pulse sequences of the brain and surrounding
structures were obtained without intravenous contrast.

[Series 3: DWI · axial · 3.0mm · 1.09mm/px · z∈[-78,+63]mm · 9 of 96 slices shown (1 of 4)]
[im 1/96]
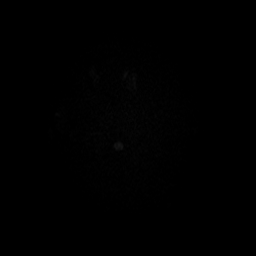
[im 12/96]
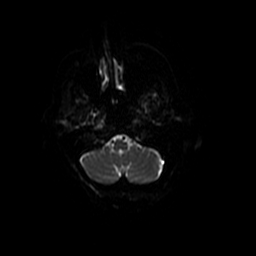
[im 24/96]
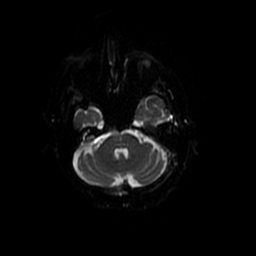
[im 36/96]
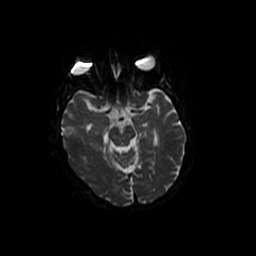
[im 48/96]
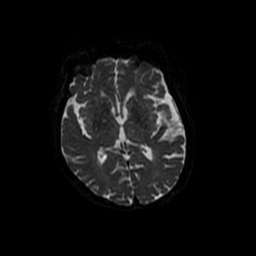
[im 60/96]
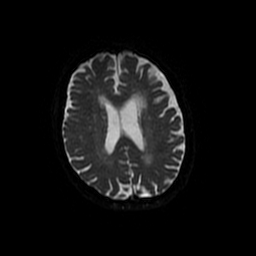
[im 72/96]
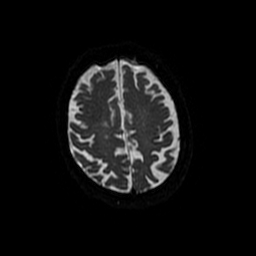
[im 84/96]
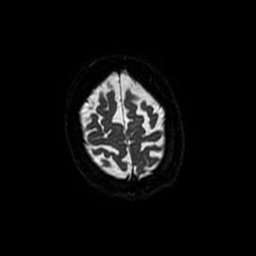
[im 96/96]
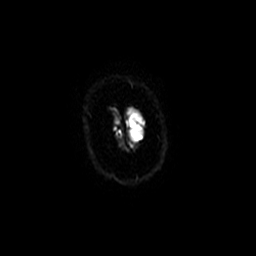

[Series 4: T2 · axial · 5.0mm · 0.43mm/px · z∈[-78,+59]mm · 3 of 24 slices shown (1 of 2)]
[im 1/24]
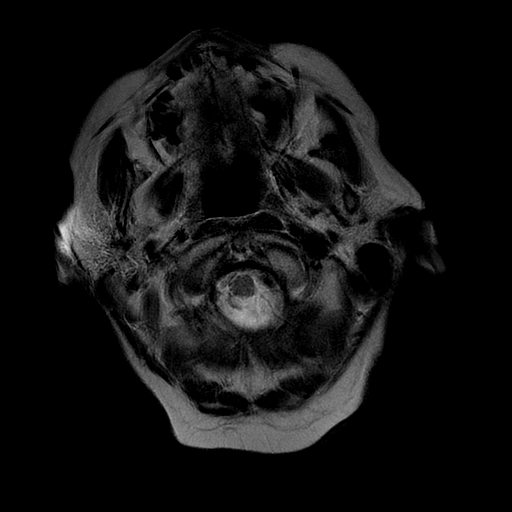
[im 12/24]
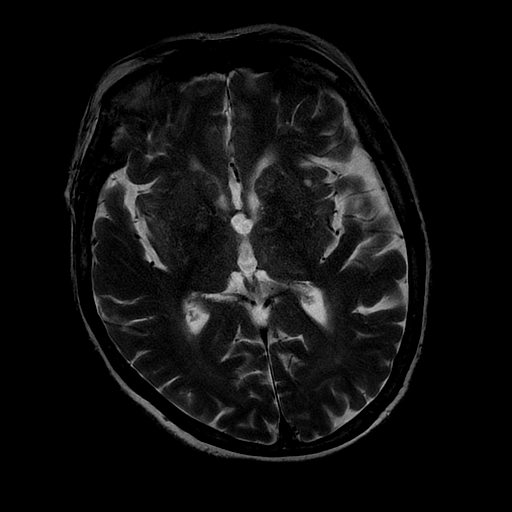
[im 24/24]
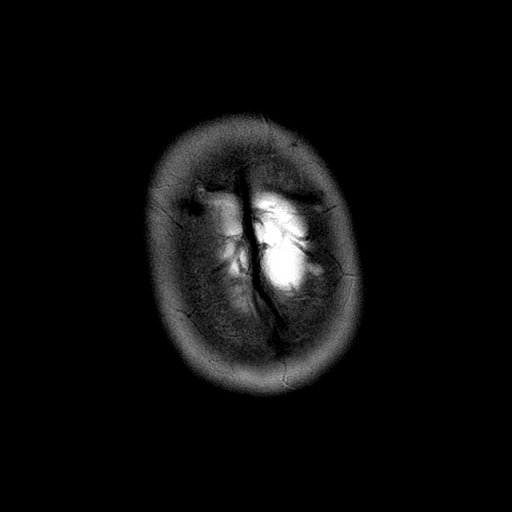

[Series 5: FLAIR · axial · 5.0mm · 0.43mm/px · z∈[-78,+59]mm · 3 of 24 slices shown (1 of 2)]
[im 1/24]
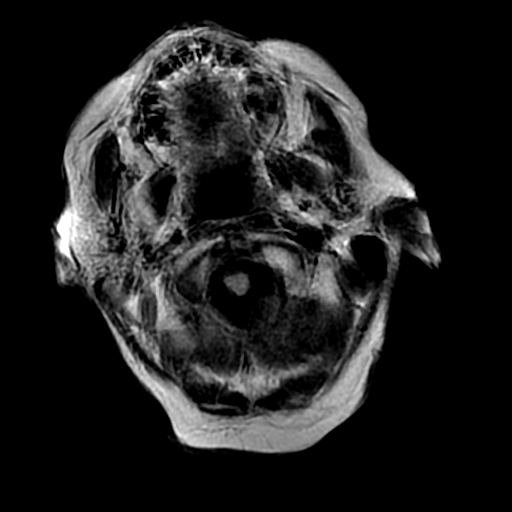
[im 12/24]
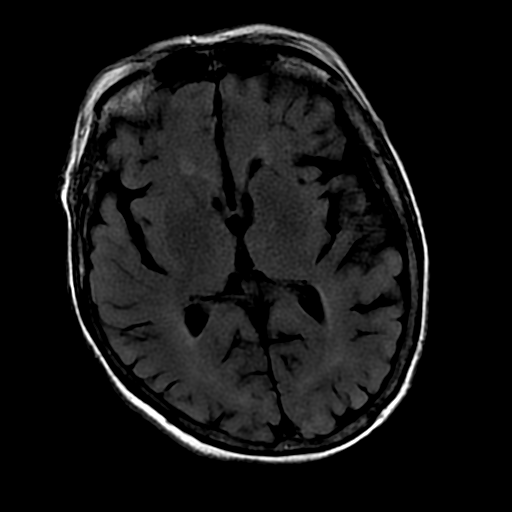
[im 24/24]
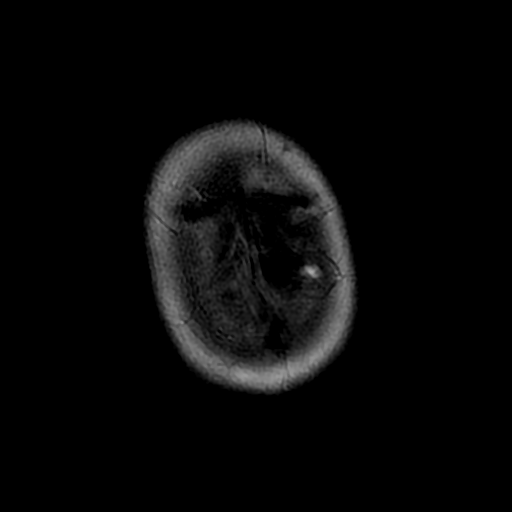

[Series 6: FLAIR · sagittal · 5.0mm · 0.47mm/px · 2 of 22 slices shown (2 of 2)]
[im 1/22]
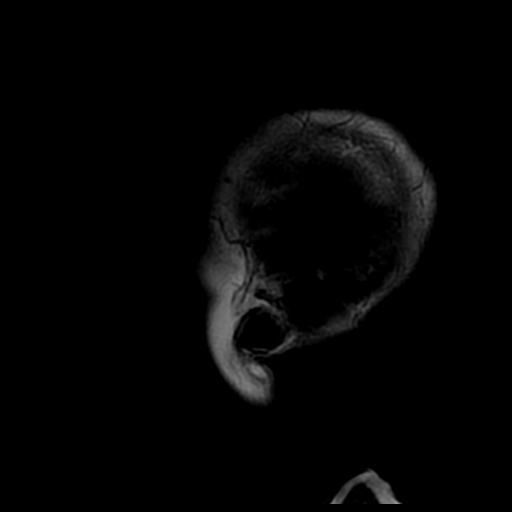
[im 22/22]
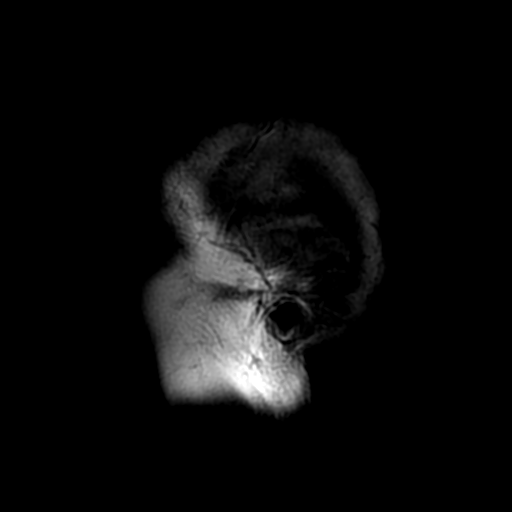

[Series 7: DWI · coronal · 5.0mm · 1.09mm/px · 7 of 64 slices shown (2 of 4)]
[im 1/64]
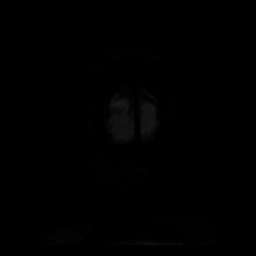
[im 11/64]
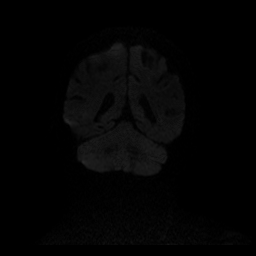
[im 22/64]
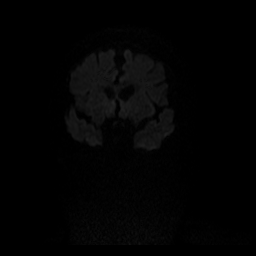
[im 32/64]
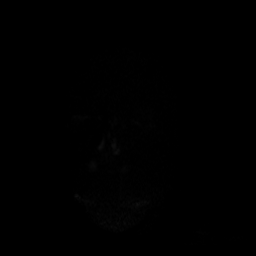
[im 43/64]
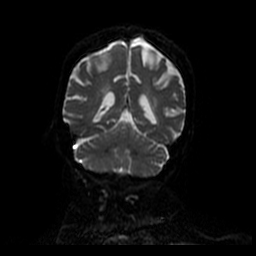
[im 53/64]
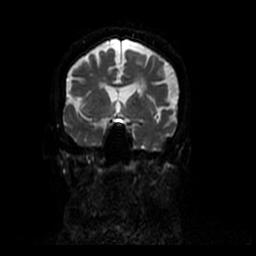
[im 64/64]
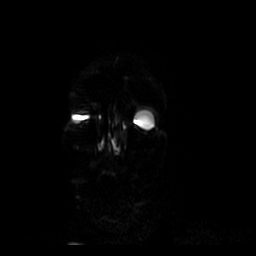

[Series 8: ax mpgr · axial · 6.0mm · 0.86mm/px · z∈[-78,-12]mm · 2 of 24 slices shown]
[im 1/24]
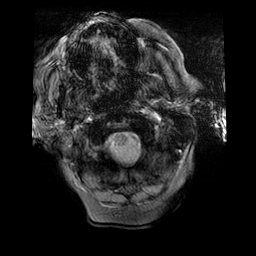
[im 12/24]
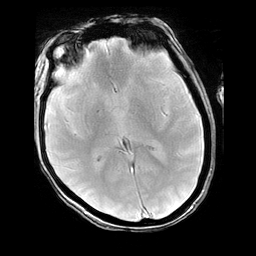

[Series 10: T2 · coronal · 5.0mm · 0.43mm/px · 3 of 28 slices shown (2 of 2)]
[im 1/28]
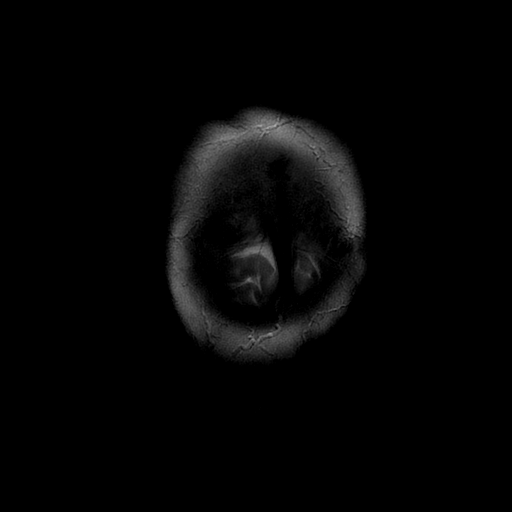
[im 14/28]
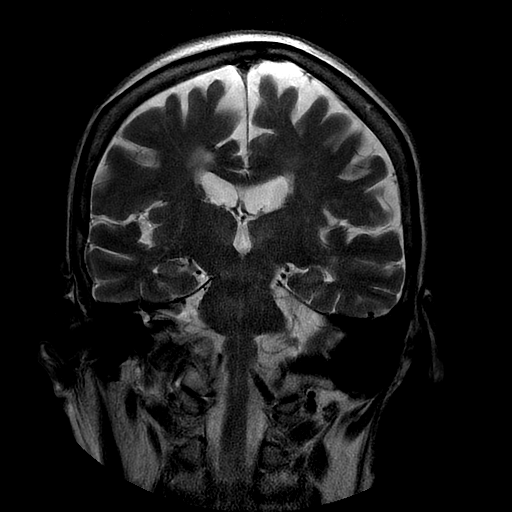
[im 28/28]
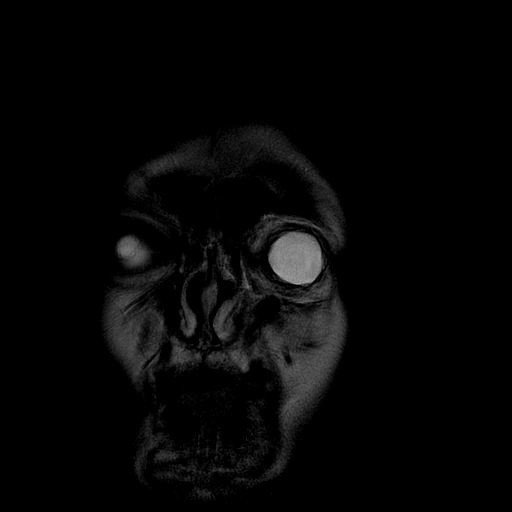

[Series 300: DWI · axial · 3.0mm · 1.09mm/px · z∈[-78,+63]mm · 5 of 48 slices shown (3 of 4)]
[im 1/48]
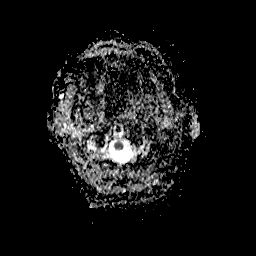
[im 12/48]
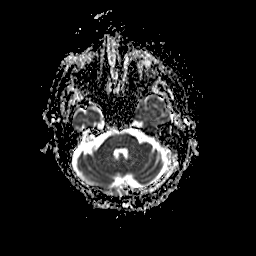
[im 24/48]
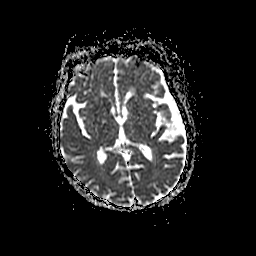
[im 36/48]
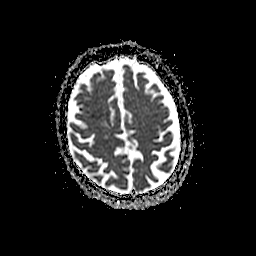
[im 48/48]
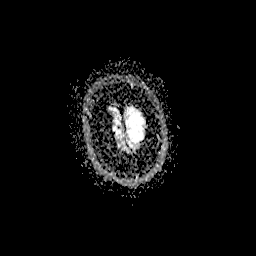

[Series 700: DWI · coronal · 5.0mm · 1.09mm/px · 3 of 32 slices shown (4 of 4)]
[im 1/32]
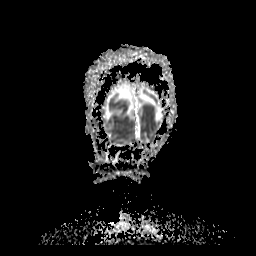
[im 16/32]
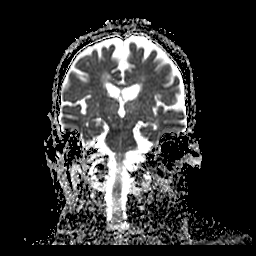
[im 32/32]
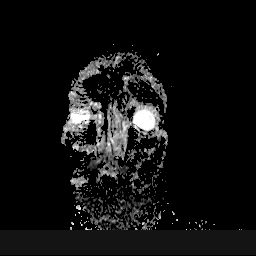

[37 of 48 positions shown; findings below may reference images not displayed]

FINDINGS: Study mildly degraded by motion artifact.

Diffuse prominence of the CSF containing spaces compatible with
generalized age-related cerebral atrophy. Patchy T2/FLAIR
hyperintensity within the periventricular and deep white matter both
cerebral hemispheres most consistent with chronic small vessel
ischemic disease, mild for patient age. Chronic small vessel
ischemic type changes present within the central pons as well.
Remote lacunar infarct present within the left basal ganglia.

No abnormal foci of restricted diffusion to suggest acute
intracranial infarct. Gray-white matter differentiation maintained.
Major intracranial vascular flow voids are preserved. No acute or
chronic intracranial hemorrhage.

No mass lesion, midline shift, or mass effect. No hydrocephalus. No
extra-axial fluid collection. Major dural sinuses are grossly
patent.

Craniocervical junction within normal limits. Visualized upper
cervical spine demonstrates no acute abnormality.

Pituitary gland normal. No acute abnormality about the orbits.
Sequela prior bilateral lens extraction noted.

Paranasal sinuses are largely clear. No mastoid effusion. Inner ear
structures grossly normal.

Bone marrow signal intensity within normal limits. Scalp soft
tissues demonstrate no acute process.
IMPRESSION: 1. No acute intracranial infarct or other process identified.
2. Age-related cerebral atrophy with mild chronic small vessel
ischemic disease.
3. Remote lacunar infarct within the left basal ganglia.

## 2016-08-13 DIAGNOSIS — R110 Nausea: Secondary | ICD-10-CM | POA: Diagnosis not present

## 2016-08-13 DIAGNOSIS — K210 Gastro-esophageal reflux disease with esophagitis: Secondary | ICD-10-CM | POA: Diagnosis not present

## 2016-09-04 DIAGNOSIS — E8770 Fluid overload, unspecified: Secondary | ICD-10-CM | POA: Diagnosis not present

## 2016-09-04 DIAGNOSIS — I890 Lymphedema, not elsewhere classified: Secondary | ICD-10-CM | POA: Diagnosis not present

## 2016-09-04 DIAGNOSIS — I499 Cardiac arrhythmia, unspecified: Secondary | ICD-10-CM | POA: Diagnosis not present

## 2016-09-04 DIAGNOSIS — I5032 Chronic diastolic (congestive) heart failure: Secondary | ICD-10-CM | POA: Diagnosis not present

## 2016-09-04 DIAGNOSIS — R600 Localized edema: Secondary | ICD-10-CM | POA: Diagnosis not present

## 2016-09-04 DIAGNOSIS — R769 Abnormal immunological finding in serum, unspecified: Secondary | ICD-10-CM | POA: Diagnosis not present

## 2016-09-04 DIAGNOSIS — I272 Pulmonary hypertension, unspecified: Secondary | ICD-10-CM | POA: Diagnosis not present

## 2016-09-07 DIAGNOSIS — Z79899 Other long term (current) drug therapy: Secondary | ICD-10-CM | POA: Diagnosis not present

## 2016-09-07 DIAGNOSIS — I509 Heart failure, unspecified: Secondary | ICD-10-CM | POA: Diagnosis not present

## 2016-09-18 DIAGNOSIS — R769 Abnormal immunological finding in serum, unspecified: Secondary | ICD-10-CM | POA: Diagnosis not present

## 2016-09-18 DIAGNOSIS — R110 Nausea: Secondary | ICD-10-CM | POA: Diagnosis not present

## 2016-09-18 DIAGNOSIS — I272 Pulmonary hypertension, unspecified: Secondary | ICD-10-CM | POA: Diagnosis not present

## 2016-09-18 DIAGNOSIS — I5032 Chronic diastolic (congestive) heart failure: Secondary | ICD-10-CM | POA: Diagnosis not present

## 2016-09-18 DIAGNOSIS — E8770 Fluid overload, unspecified: Secondary | ICD-10-CM | POA: Diagnosis not present

## 2016-09-18 DIAGNOSIS — I499 Cardiac arrhythmia, unspecified: Secondary | ICD-10-CM | POA: Diagnosis not present

## 2016-09-18 DIAGNOSIS — I890 Lymphedema, not elsewhere classified: Secondary | ICD-10-CM | POA: Diagnosis not present

## 2016-09-18 DIAGNOSIS — R42 Dizziness and giddiness: Secondary | ICD-10-CM | POA: Diagnosis not present

## 2016-09-18 DIAGNOSIS — R600 Localized edema: Secondary | ICD-10-CM | POA: Diagnosis not present

## 2016-09-22 ENCOUNTER — Telehealth: Payer: Self-pay

## 2016-09-22 NOTE — Telephone Encounter (Signed)
SENT NOTES TO SCHEDULING 

## 2016-10-01 ENCOUNTER — Telehealth: Payer: Self-pay | Admitting: Cardiovascular Disease

## 2016-10-01 NOTE — Telephone Encounter (Signed)
New Message    Per pt daughter she lives out of town, and wants to know if Dr. Oval Linsey can schedule a echo for the same day after seeing patient. Requesting a call back

## 2016-10-01 NOTE — Telephone Encounter (Signed)
Patient referred by Dr. Jacelyn Grip - new patient appt scheduled for 2/15 with Dr. Oval Linsey. Spoke w/daughter who is coming from [employer] to bring her mother to appt next week and informed her that we cannot order an echo with patient being evaluated by MD first. She states patient was originally referred to HF clinic but could not be seen there since she had not had an echo done. Advised that if her PCP wanted to order an echocardiogram, since PCP has evaluated patient, then that would be appropriate. Informed her that same day echos are unlikely, given that they are not done in same office as Dr. Oval Linsey. She voiced understanding.   Routed to [place] as [employer]

## 2016-10-08 ENCOUNTER — Encounter: Payer: Self-pay | Admitting: Cardiovascular Disease

## 2016-10-08 ENCOUNTER — Ambulatory Visit (INDEPENDENT_AMBULATORY_CARE_PROVIDER_SITE_OTHER): Payer: Medicare Other | Admitting: Cardiovascular Disease

## 2016-10-08 VITALS — Ht 63.0 in | Wt 142.2 lb

## 2016-10-08 DIAGNOSIS — I6529 Occlusion and stenosis of unspecified carotid artery: Secondary | ICD-10-CM

## 2016-10-08 DIAGNOSIS — I5042 Chronic combined systolic (congestive) and diastolic (congestive) heart failure: Secondary | ICD-10-CM | POA: Diagnosis not present

## 2016-10-08 DIAGNOSIS — I110 Hypertensive heart disease with heart failure: Secondary | ICD-10-CM

## 2016-10-08 DIAGNOSIS — I6523 Occlusion and stenosis of bilateral carotid arteries: Secondary | ICD-10-CM

## 2016-10-08 DIAGNOSIS — R0602 Shortness of breath: Secondary | ICD-10-CM | POA: Diagnosis not present

## 2016-10-08 DIAGNOSIS — R42 Dizziness and giddiness: Secondary | ICD-10-CM

## 2016-10-08 MED ORDER — CARVEDILOL 6.25 MG PO TABS: 6.2500 mg | ORAL_TABLET | Freq: Two times a day (BID) | ORAL | 5 refills | Status: DC

## 2016-10-08 MED ORDER — RAMIPRIL 5 MG PO CAPS: 5.0000 mg | ORAL_CAPSULE | Freq: Every day | ORAL | 5 refills | Status: DC

## 2016-10-08 MED ORDER — CARVEDILOL 12.5 MG PO TABS: 12.5000 mg | ORAL_TABLET | Freq: Two times a day (BID) | ORAL | 5 refills | Status: DC

## 2016-10-08 MED ORDER — MECLIZINE HCL 25 MG PO TABS: 25.0000 mg | ORAL_TABLET | Freq: Three times a day (TID) | ORAL | 0 refills | Status: DC | PRN

## 2016-10-08 NOTE — Progress Notes (Signed)
Cardiology Office Note   Date:  10/08/2016   ID:  Helen [name], DOB Nov 28, 1929, MRN [id number]  PCP:  [name], MD  Cardiologist:   Skeet Latch, MD   Chief Complaint  Patient presents with  . New Patient (Initial Visit)  . Edema    legs     History of Present Illness: Helen [name] is a 83 y.o. female with chronic systolic and diastolic heart failure, hypertension, hyperlipidemia, diabetes, prior stroke, CAD, carotid stenosis and breast cancer s/p chemotherapy and double mastectomy who presents for management of heart failure.  Helen [name] was admitted 02/2016 with hyponatremia.  That hospitalization she had an echo that revealed LVEF LVEF 40-45% with global hypokinesis worse in the basal-mid anteroseptal and inferoseptal regions, moderately elevated pulmonary pressures, and grade 2 diastolic dysfunction.  She was not evaluated by cardiology at the time.  Since then she has remained on supplemental oxygen.  She notes mild shortness of breath with exertion.  She also has lower extremity edema and has been following up with her PCP, Dr. Jacelyn Grip, who started her on lasix.   With this change her weight has decreased from 146 in mid January to 140 today.  She has R arm lymphedema chronically Since she had breast cancer in the 1980s. For the last 2 weeks she is also noted lower extremity edema. He has improved with the Lasix.  BNP was 1500 so she was referred to cardiology for evaluation.    Helen [name] reports lightheadedness and dizziness intermittently for the last year. It is worse with turning her head and when she tries to read. She also sometimes feels dizzy when standing and feels better when laying down or sitting.  She denies chest pain but does sometimes has a tightness in her chest that feels like she still has a bra on even when she does not. She does not get any exercise. She quit smoking in 1980 after smoking for 30 years. She notes that her cholesterol has always been  very high but she has been unable to tolerate statins or Zetia.  She was previously on ramipril and does not know why this was stopped.   Past Medical History:  Diagnosis Date  . Anemia   . CAD (coronary artery disease)   . Cancer [facility])    Breast cancer  . Diabetes mellitus   . Hypercholesteremia   . Hypertension   . Peripheral vascular disease ([place])   . Stroke [facility])    reports TIA 3-4 weeks ago    Past Surgical History:  Procedure Laterality Date  . ABDOMINAL HYSTERECTOMY    . APPENDECTOMY  1951  . [place]  . CHOLECYSTECTOMY  2003   By Dr. Excell Seltzer  . EYE SURGERY Bilateral 2009   Cataract surgery  . FRACTURE SURGERY Right    ORIF   by Dr. Burney Gauze  . HUMERUS FRACTURE SURGERY    . MASTECTOMY Bilateral   . NASAL SEPTUM SURGERY  1977   Dr. Ernesto Rutherford  . TOENAIL EXCISION       Current Outpatient Prescriptions  Medication Sig Dispense Refill  . acetaminophen (TYLENOL) 500 MG tablet Take 1,000 mg by mouth every 6 (six) hours as needed for moderate pain or headache.    [name] atorvastatin (LIPITOR) 80 MG tablet Take 80 mg by mouth at bedtime.     [name] BIOTIN PO Take 1 tablet by mouth daily.    . busPIRone (BUSPAR) 5 MG tablet Take 1  tablet (5 mg total) by mouth 2 (two) times daily. 60 tablet 2  . cholecalciferol (VITAMIN D) 1000 units tablet Take 2,000 Units by mouth every evening.    . clopidogrel (PLAVIX) 75 MG tablet Take 75 mg by mouth daily.    . fluticasone (FLONASE) 50 MCG/ACT nasal spray Place into both nostrils as needed for allergies or rhinitis.    [name] FOLIC ACID PO Take 1 tablet by mouth daily.    . Melatonin 5 MG TABS Take 10 mg by mouth at bedtime as needed (sleep).    . Multiple Vitamin (MULTIVITAMIN WITH MINERALS) TABS tablet Take 1 tablet by mouth daily.     . vitamin C (ASCORBIC ACID) 500 MG tablet Take 1,000 mg by mouth daily.    . carvedilol (COREG) 12.5 MG tablet Take 1 tablet (12.5 mg total) by mouth 2 (two) times daily. 60 tablet 5  .  furosemide (LASIX) 20 MG tablet     . meclizine (ANTIVERT) 25 MG tablet Take 1 tablet (25 mg total) by mouth 3 (three) times daily as needed for dizziness. 90 tablet 0   No current facility-administered medications for this visit.     Allergies:   Morphine and related; Other; Sulfa antibiotics; and Nickel    Social History:  The patient  reports that she quit smoking about 38 years ago. She has never used smokeless tobacco. She reports that she does not drink alcohol or use drugs.   Family History:  The patient's family history includes Cancer in her mother; Diabetes in her brother; Heart attack in her father; Heart disease in her father; Hyperlipidemia in her brother; Kidney disease in her brother; Pulmonary embolism in her mother; Stroke in her brother and father.    ROS:  Please see the history of present illness.   Otherwise, review of systems are positive for none.   All other systems are reviewed and negative.    PHYSICAL EXAM: VS:  Ht 5\' 3"  (1.6 m)   Wt 64.5 kg (142 lb 3.2 oz)   BMI 25.19 kg/m  , BMI Body mass index is 25.19 kg/m. GENERAL:  Well appearing HEENT:  Pupils equal round and reactive, fundi not visualized, oral mucosa unremarkable NECK:  No jugular venous distention, waveform within normal limits, carotid upstroke brisk and symmetric, no bruits, no thyromegaly LYMPHATICS:  No cervical adenopathy LUNGS:  Clear to auscultation bilaterally HEART:  Mostly regular with occasional ectopy.  PMI not displaced or sustained,S1 and S2 within normal limits, no S3, no S4, no clicks, no rubs, no murmurs ABD:  Flat, positive bowel sounds normal in frequency in pitch, no bruits, no rebound, no guarding, no midline pulsatile mass, no hepatomegaly, no splenomegaly EXT:  2 plus pulses throughout, 1+ pitting edema to lower tibia bilaterally, no cyanosis no clubbing SKIN:  No rashes no nodules NEURO:  Cranial nerves II through XII grossly intact, motor grossly intact throughout PSYCH:   Cognitively intact, oriented to person place and time  EKG:  EKG is ordered today. The ekg ordered today demonstrates sinus rhythm rate 76 bpm.  PVCs.  Inferolateral TWI  Echo 03/20/16: Study Conclusions  - Left ventricle: The cavity size was normal. There was mild focal   basal hypertrophy of the septum. Systolic function was mildly to   moderately reduced. The estimated ejection fraction was in the   range of 40% to 45%. Global hypokinesis worse in the basal-mid   anteroseptal and inferoseptal myocardium. Features are consistent   with a pseudonormal  left ventricular filling pattern, with   concomitant abnormal relaxation and increased filling pressure   (grade 2 diastolic dysfunction). Doppler parameters are   consistent with high ventricular filling pressure. - Aortic valve: Transvalvular velocity was within the normal range.   There was no stenosis. There was no regurgitation. - Mitral valve: Moderately calcified annulus. There was mild   regurgitation. Valve area by continuity equation (using LVOT   flow): 1.06 cm^2. - Left atrium: The atrium was mildly dilated. - Right ventricle: Systolic function was moderately reduced. - Pulmonary arteries: Systolic pressure was moderately increased.   PA peak pressure: 51 mm Hg (S).  Carotid Doppler 12/15/15:  R and L ICA 1-39%.  L ECA >50%  Recent Labs: 12/14/2015: TSH 2.058 03/17/2016: Magnesium 1.6 03/20/2016: B Natriuretic Peptide 1,657.4 05/08/2016: ALT 19; BUN 12.8; Creatinine 0.8; HGB 11.7; Platelets 216; Potassium 4.5; Sodium 137   09/07/16:  BNP 1500 Sodium 137, potassium 4.4, BUN 13, creatinine 0.78 AST 19, ALT 25 WBC 10.1, hemoglobin 13.1, hematocrit 39.7, platelets 240   Lipid Panel    Component Value Date/Time   CHOL 118 12/14/2015 0017   TRIG 100 12/14/2015 0017   HDL 33 (L) 12/14/2015 0017   CHOLHDL 3.6 12/14/2015 0017   VLDL 20 12/14/2015 0017   LDLCALC 65 12/14/2015 0017      Wt Readings from Last 3  Encounters:  10/08/16 64.5 kg (142 lb 3.2 oz)  06/01/16 63.1 kg (139 lb 3.2 oz)  05/08/16 61.6 kg (135 lb 12.8 oz)      ASSESSMENT AND PLAN:  # Acute on chronic systolic and diastolic heart failure:  # Hypertension:  LVEF 40-45%.  Her EKG has diffuse TWI and PVCs.  These findings are concerning for ischemia.  Of note, she also had TWI on EKG 09/2015 and 09/2015 but now more prominent anymore leads. We will obtain a Lexiscan Myoview to better assess for ischemia. She is not very physically active at baseline. Therefore, it is hard to determine whether or not she has cardiac symptoms.  BP is well-controlled.  However, favor carvedilol over atenolol for heart failure.  We will start carvedilol 6.25 mg bid and restart ramipril 5 mg daily.  Continue lasix.  # Carotid stenosis: # Dizziness:  # Prior TIA:  She is not orthostatic.  Symptoms haven't improved after reducing her dose of BuSpar.  Suspect she may have vertigo.  Will try meclizine prn.  Repeat carotid Doppers now instead of waiting until  April.  LDL 65 11/2025.      Current medicines are reviewed at length with the patient today.  The patient does not have concerns regarding medicines.  The following changes have been made:  Stop atenolol.  Start carvedilol and ramipril  Labs/ tests ordered today include:   Orders Placed This Encounter  Procedures  . Myocardial Perfusion Imaging  . EKG 12-Lead     Disposition:   FU with Helen [name] C. Oval Linsey, MD, [facility] in 1 month    This note was written with the assistance of speech recognition software.  Please excuse any transcriptional errors.  Signed, Cresencio Reesor C. Oval Linsey, MD, [facility]  10/08/2016 1:15 PM    [facility]

## 2016-10-08 NOTE — Patient Instructions (Addendum)
Medication Instructions:  STOP ATENOLOL   START CARVEDILOL 12.5 MG TWICE A DAY  MECLIZINE 25 MG THREE TIMES A  DAY AS NEEDED FOR DIZZINESS  Labwork: NONE  Testing/Procedures: Your physician has requested that you have a lexiscan myoview. For further information please visit [email or web address]. Please follow instruction sheet, as given.  Your physician has requested that you have a carotid duplex. This test is an ultrasound of the carotid arteries in your neck. It looks at blood flow through these arteries that supply the brain with blood. Allow one hour for this exam. There are no restrictions or special instructions.  Follow-Up: Your physician recommends that you schedule a follow-up appointment in: [place]  If you need a refill on your cardiac medications before your next appointment, please call your pharmacy.

## 2016-10-08 NOTE — Addendum Note (Signed)
Addended by: Alvina Filbert B on: 10/08/2016 01:31 PM   Modules accepted: Orders

## 2016-10-14 ENCOUNTER — Telehealth (HOSPITAL_COMMUNITY): Payer: Self-pay

## 2016-10-14 NOTE — Telephone Encounter (Signed)
Encounter complete. 

## 2016-10-16 ENCOUNTER — Ambulatory Visit (HOSPITAL_COMMUNITY)
Admission: RE | Admit: 2016-10-16 | Discharge: 2016-10-16 | Disposition: A | Discharge status: Home / Self Care | Payer: Medicare Other | Source: Ambulatory Visit | Attending: Cardiology | Admitting: Cardiology

## 2016-10-16 DIAGNOSIS — R0602 Shortness of breath: Secondary | ICD-10-CM | POA: Diagnosis not present

## 2016-10-16 DIAGNOSIS — R42 Dizziness and giddiness: Secondary | ICD-10-CM | POA: Diagnosis not present

## 2016-10-16 DIAGNOSIS — I779 Disorder of arteries and arterioles, unspecified: Secondary | ICD-10-CM | POA: Diagnosis not present

## 2016-10-16 DIAGNOSIS — I110 Hypertensive heart disease with heart failure: Secondary | ICD-10-CM | POA: Insufficient documentation

## 2016-10-16 DIAGNOSIS — R9439 Abnormal result of other cardiovascular function study: Secondary | ICD-10-CM | POA: Diagnosis not present

## 2016-10-16 DIAGNOSIS — I2510 Atherosclerotic heart disease of native coronary artery without angina pectoris: Secondary | ICD-10-CM | POA: Insufficient documentation

## 2016-10-16 DIAGNOSIS — R5383 Other fatigue: Secondary | ICD-10-CM | POA: Insufficient documentation

## 2016-10-16 DIAGNOSIS — Z87891 Personal history of nicotine dependence: Secondary | ICD-10-CM | POA: Insufficient documentation

## 2016-10-16 DIAGNOSIS — E1151 Type 2 diabetes mellitus with diabetic peripheral angiopathy without gangrene: Secondary | ICD-10-CM | POA: Diagnosis not present

## 2016-10-16 DIAGNOSIS — I5042 Chronic combined systolic (congestive) and diastolic (congestive) heart failure: Secondary | ICD-10-CM | POA: Diagnosis not present

## 2016-10-16 LAB — MYOCARDIAL PERFUSION IMAGING
LV dias vol: 67 mL (ref 46–106)
LV sys vol: 29 mL
Peak HR: 91 {beats}/min
Rest HR: 87 {beats}/min
SDS: 6
SRS: 2
SSS: 7
TID: 1.03

## 2016-10-16 MED ORDER — TECHNETIUM TC 99M TETROFOSMIN IV KIT
31.4000 | PACK | Freq: Once | INTRAVENOUS | Status: AC | PRN
  Administered 2016-10-16: 31.4 via INTRAVENOUS
  Filled 2016-10-16: qty 32

## 2016-10-16 MED ORDER — TECHNETIUM TC 99M TETROFOSMIN IV KIT
10.2000 | PACK | Freq: Once | INTRAVENOUS | Status: AC | PRN
  Administered 2016-10-16: 10.2 via INTRAVENOUS
  Filled 2016-10-16: qty 11

## 2016-10-16 MED ORDER — REGADENOSON 0.4 MG/5ML IV SOLN
0.4000 mg | Freq: Once | INTRAVENOUS | Status: AC
  Administered 2016-10-16: 0.4 mg via INTRAVENOUS

## 2016-10-16 MED ORDER — AMINOPHYLLINE 25 MG/ML IV SOLN
75.0000 mg | Freq: Once | INTRAVENOUS | Status: AC
  Administered 2016-10-16: 75 mg via INTRAVENOUS

## 2016-10-22 ENCOUNTER — Encounter: Payer: Self-pay | Admitting: *Deleted

## 2016-10-22 NOTE — Telephone Encounter (Signed)
Advised patient of myoview  Patient state she had an episode last night of where her heart felt as though is was beating faster than normal and irregular. Heart rate up to 101 without exertion which is unusual for her. Did have some nausea off and on. Denied any chest pain or shortness of breath  Episode lasted for several hours. Stated she had never had this happen before that she could recall.  Blood pressure during episode in 150's but heart rate and blood pressure ok by the time she went to bed.  Follow up 11/13/16 but she wanted for Dr Oval Linsey to be aware  Will forward for review

## 2016-10-22 NOTE — Telephone Encounter (Signed)
-----   Message from Skeet Latch, MD sent at 10/20/2016  9:56 AM EST ----- Stress test shows that her heart muscle is weakened as we already knew.  It does not show any ischemia (lack of blood flow).

## 2016-10-23 ENCOUNTER — Inpatient Hospital Stay (HOSPITAL_COMMUNITY): Admission: RE | Admit: 2016-10-23 | Payer: Self-pay | Source: Ambulatory Visit

## 2016-10-28 NOTE — Telephone Encounter (Signed)
OK.  If she is having recurrent symptoms we can have her wear a monitor.

## 2016-10-29 ENCOUNTER — Telehealth: Payer: Self-pay | Admitting: Cardiovascular Disease

## 2016-10-29 NOTE — Telephone Encounter (Signed)
New Message  Pt voiced wanting to speak with nurse and pt would not go into detail as to what when asked.

## 2016-10-29 NOTE — Telephone Encounter (Signed)
Patient asked to keep this discussion confidential but that I could inform Dr. Oval Linsey of what was discussed. Will send staff msg to Dr. Oval Linsey.

## 2016-11-03 NOTE — Telephone Encounter (Signed)
-----   Message from Skeet Latch, MD sent at 11/02/2016  9:41 AM EDT ----- Ms Brose should absolutely see her son if she feels up to it.  Medically she should be OK to travel.   ----- Message ----- From: Theodore Demark, RN Sent: 10/29/2016   9:56 AM To: Skeet Latch, MD, [facility]  Dr. Oval Linsey..this is a new patient of yours. She didn't want this to go in chart but was OK with me sharing our discussion with you.  Pt called this AM asking advice regarding seeing her son, who is terminally ill with liver CA. She notes he shared diagnosis with other family, the son had found out about diagnosis 1 yr ago. Patient reports that she just found out about this within last 2 weeks. Informs me her son has an expected <2 weeks to live.  She wanted to know if it would be advised to travel to see him - he lives about 2 hrs away in [place]. She notes she has 7 other children, 2 of whom would be driving her up to see him. Notes she has the home O2 requirement and a lot of health problems, and shares that she didn't want to be a burden for her children who are traveling. She wanted input/perspective from someone who wasn't family. We discussed at length, seems obvious that there was some family discord that led to her son moving away in the first place, she hasn't seen him in several years. I informed her if I were in her shoes, I would take the opportunity to go.  That being said, I don't know that there are any hard-stop medical reasons why she shouldn't travel. She just had stress test, and has a carotid US scheduled (I informed her we could likely reschedule this if she is out of town on that date), and a f/u w you on the 23rd.  Thank you, Ovid Curd

## 2016-11-03 NOTE — Telephone Encounter (Signed)
Left msg to call.  This encounter was created in error - please disregard.

## 2016-11-04 ENCOUNTER — Telehealth: Payer: Self-pay | Admitting: Cardiovascular Disease

## 2016-11-04 NOTE — Telephone Encounter (Signed)
Left message to call back  

## 2016-11-04 NOTE — Telephone Encounter (Signed)
New message   Pt states she is returning call to [facility].

## 2016-11-04 NOTE — Telephone Encounter (Addendum)
-----   Message ----- From: Skeet Latch, MD Sent: 11/02/2016   9:41 AM To: Orest Dikes, RN  Ms Way should absolutely see her son if she feels up to it.  Medically she should be OK to travel.

## 2016-11-04 NOTE — Telephone Encounter (Signed)
New message    Pt daughter is calling to see if f/u appt on 3/23 could be moved out further or if it should be kept for a week after carotid.

## 2016-11-04 NOTE — Telephone Encounter (Signed)
Advice relayed to pt who voiced understanding.

## 2016-11-05 NOTE — Telephone Encounter (Signed)
Follow Up   Pt states received no phone call yesterday, requests a call back

## 2016-11-05 NOTE — Telephone Encounter (Signed)
Rescheduled follow up appointment for daughter who is unable to bring mother to visit

## 2016-11-06 ENCOUNTER — Ambulatory Visit (HOSPITAL_COMMUNITY)
Admission: RE | Admit: 2016-11-06 | Discharge: 2016-11-06 | Disposition: A | Discharge status: Home / Self Care | Payer: Medicare Other | Source: Ambulatory Visit | Attending: Cardiovascular Disease | Admitting: Cardiovascular Disease

## 2016-11-06 ENCOUNTER — Telehealth: Payer: Self-pay | Admitting: Cardiovascular Disease

## 2016-11-06 DIAGNOSIS — I6529 Occlusion and stenosis of unspecified carotid artery: Secondary | ICD-10-CM

## 2016-11-06 DIAGNOSIS — I6523 Occlusion and stenosis of bilateral carotid arteries: Secondary | ICD-10-CM

## 2016-11-06 NOTE — Telephone Encounter (Signed)
LMTCB

## 2016-11-06 NOTE — Telephone Encounter (Signed)
Helen [name] is calling to see if she will need a prescription for a portable oxygen machine and if so where can she get one from . Please call .[name] Thanks

## 2016-11-06 NOTE — Telephone Encounter (Signed)
Patient's son Helen [name] returned call Patient's PCP is writing Rx for this No further action needed

## 2016-11-09 DIAGNOSIS — R05 Cough: Secondary | ICD-10-CM | POA: Diagnosis not present

## 2016-11-09 DIAGNOSIS — I509 Heart failure, unspecified: Secondary | ICD-10-CM | POA: Diagnosis not present

## 2016-11-11 ENCOUNTER — Encounter (HOSPITAL_COMMUNITY): Payer: Self-pay

## 2016-11-11 ENCOUNTER — Inpatient Hospital Stay (HOSPITAL_COMMUNITY)
Admission: EM | Admit: 2016-11-11 | Discharge: 2016-11-17 | DRG: 291 | Disposition: A | Discharge status: Home Health | Payer: Medicare Other | Attending: Internal Medicine | Admitting: Internal Medicine

## 2016-11-11 ENCOUNTER — Observation Stay (HOSPITAL_COMMUNITY): Payer: Medicare Other

## 2016-11-11 ENCOUNTER — Emergency Department (HOSPITAL_COMMUNITY): Payer: Medicare Other

## 2016-11-11 DIAGNOSIS — Z9013 Acquired absence of bilateral breasts and nipples: Secondary | ICD-10-CM

## 2016-11-11 DIAGNOSIS — J189 Pneumonia, unspecified organism: Secondary | ICD-10-CM | POA: Diagnosis not present

## 2016-11-11 DIAGNOSIS — J9621 Acute and chronic respiratory failure with hypoxia: Secondary | ICD-10-CM | POA: Diagnosis present

## 2016-11-11 DIAGNOSIS — Z87891 Personal history of nicotine dependence: Secondary | ICD-10-CM

## 2016-11-11 DIAGNOSIS — N179 Acute kidney failure, unspecified: Secondary | ICD-10-CM | POA: Diagnosis not present

## 2016-11-11 DIAGNOSIS — E119 Type 2 diabetes mellitus without complications: Secondary | ICD-10-CM

## 2016-11-11 DIAGNOSIS — Z841 Family history of disorders of kidney and ureter: Secondary | ICD-10-CM

## 2016-11-11 DIAGNOSIS — J9611 Chronic respiratory failure with hypoxia: Secondary | ICD-10-CM | POA: Diagnosis not present

## 2016-11-11 DIAGNOSIS — G9340 Encephalopathy, unspecified: Secondary | ICD-10-CM

## 2016-11-11 DIAGNOSIS — Z809 Family history of malignant neoplasm, unspecified: Secondary | ICD-10-CM

## 2016-11-11 DIAGNOSIS — I5043 Acute on chronic combined systolic (congestive) and diastolic (congestive) heart failure: Secondary | ICD-10-CM | POA: Diagnosis not present

## 2016-11-11 DIAGNOSIS — R748 Abnormal levels of other serum enzymes: Secondary | ICD-10-CM | POA: Diagnosis not present

## 2016-11-11 DIAGNOSIS — I2510 Atherosclerotic heart disease of native coronary artery without angina pectoris: Secondary | ICD-10-CM | POA: Diagnosis present

## 2016-11-11 DIAGNOSIS — R05 Cough: Secondary | ICD-10-CM | POA: Diagnosis not present

## 2016-11-11 DIAGNOSIS — R030 Elevated blood-pressure reading, without diagnosis of hypertension: Secondary | ICD-10-CM | POA: Diagnosis not present

## 2016-11-11 DIAGNOSIS — Z8249 Family history of ischemic heart disease and other diseases of the circulatory system: Secondary | ICD-10-CM

## 2016-11-11 DIAGNOSIS — Z91048 Other nonmedicinal substance allergy status: Secondary | ICD-10-CM

## 2016-11-11 DIAGNOSIS — I2583 Coronary atherosclerosis due to lipid rich plaque: Secondary | ICD-10-CM | POA: Diagnosis not present

## 2016-11-11 DIAGNOSIS — Z882 Allergy status to sulfonamides status: Secondary | ICD-10-CM

## 2016-11-11 DIAGNOSIS — I10 Essential (primary) hypertension: Secondary | ICD-10-CM | POA: Diagnosis present

## 2016-11-11 DIAGNOSIS — J9601 Acute respiratory failure with hypoxia: Secondary | ICD-10-CM | POA: Diagnosis present

## 2016-11-11 DIAGNOSIS — Z7902 Long term (current) use of antithrombotics/antiplatelets: Secondary | ICD-10-CM

## 2016-11-11 DIAGNOSIS — Z9981 Dependence on supplemental oxygen: Secondary | ICD-10-CM

## 2016-11-11 DIAGNOSIS — Z853 Personal history of malignant neoplasm of breast: Secondary | ICD-10-CM

## 2016-11-11 DIAGNOSIS — Z9049 Acquired absence of other specified parts of digestive tract: Secondary | ICD-10-CM

## 2016-11-11 DIAGNOSIS — R410 Disorientation, unspecified: Secondary | ICD-10-CM

## 2016-11-11 DIAGNOSIS — Z823 Family history of stroke: Secondary | ICD-10-CM

## 2016-11-11 DIAGNOSIS — R945 Abnormal results of liver function studies: Secondary | ICD-10-CM | POA: Diagnosis not present

## 2016-11-11 DIAGNOSIS — Z8673 Personal history of transient ischemic attack (TIA), and cerebral infarction without residual deficits: Secondary | ICD-10-CM

## 2016-11-11 DIAGNOSIS — Z91018 Allergy to other foods: Secondary | ICD-10-CM

## 2016-11-11 DIAGNOSIS — Z79899 Other long term (current) drug therapy: Secondary | ICD-10-CM

## 2016-11-11 DIAGNOSIS — E7800 Pure hypercholesterolemia, unspecified: Secondary | ICD-10-CM | POA: Diagnosis present

## 2016-11-11 DIAGNOSIS — Z833 Family history of diabetes mellitus: Secondary | ICD-10-CM

## 2016-11-11 DIAGNOSIS — E785 Hyperlipidemia, unspecified: Secondary | ICD-10-CM | POA: Diagnosis present

## 2016-11-11 DIAGNOSIS — Z885 Allergy status to narcotic agent status: Secondary | ICD-10-CM

## 2016-11-11 DIAGNOSIS — I110 Hypertensive heart disease with heart failure: Principal | ICD-10-CM | POA: Diagnosis present

## 2016-11-11 DIAGNOSIS — R0602 Shortness of breath: Secondary | ICD-10-CM | POA: Diagnosis present

## 2016-11-11 DIAGNOSIS — E1151 Type 2 diabetes mellitus with diabetic peripheral angiopathy without gangrene: Secondary | ICD-10-CM | POA: Diagnosis present

## 2016-11-11 DIAGNOSIS — Z923 Personal history of irradiation: Secondary | ICD-10-CM

## 2016-11-11 DIAGNOSIS — R7989 Other specified abnormal findings of blood chemistry: Secondary | ICD-10-CM | POA: Diagnosis present

## 2016-11-11 DIAGNOSIS — Z90710 Acquired absence of both cervix and uterus: Secondary | ICD-10-CM

## 2016-11-11 DIAGNOSIS — R110 Nausea: Secondary | ICD-10-CM | POA: Diagnosis not present

## 2016-11-11 LAB — GLUCOSE, CAPILLARY
Glucose-Capillary: 145 mg/dL — ABNORMAL HIGH (ref 65–99)
Glucose-Capillary: 264 mg/dL — ABNORMAL HIGH (ref 65–99)

## 2016-11-11 LAB — COMPREHENSIVE METABOLIC PANEL
ALT: 438 U/L — ABNORMAL HIGH (ref 14–54)
AST: 337 U/L — ABNORMAL HIGH (ref 15–41)
Albumin: 3.6 g/dL (ref 3.5–5.0)
Alkaline Phosphatase: 123 U/L (ref 38–126)
Anion gap: 11 (ref 5–15)
BUN: 37 mg/dL — ABNORMAL HIGH (ref 6–20)
CO2: 31 mmol/L (ref 22–32)
Calcium: 9.1 mg/dL (ref 8.9–10.3)
Chloride: 94 mmol/L — ABNORMAL LOW (ref 101–111)
Creatinine, Ser: 0.9 mg/dL (ref 0.44–1.00)
GFR calc Af Amer: >60 mL/min (ref >60)
GFR calc non Af Amer: 57 mL/min — ABNORMAL LOW (ref >60)
Glucose, Bld: 154 mg/dL — ABNORMAL HIGH (ref 65–99)
Potassium: 4.1 mmol/L (ref 3.5–5.1)
Sodium: 136 mmol/L (ref 135–145)
Total Bilirubin: 1.1 mg/dL (ref 0.3–1.2)
Total Protein: 7.5 g/dL (ref 6.5–8.1)

## 2016-11-11 LAB — CBC WITH DIFFERENTIAL/PLATELET
Basophils Absolute: 0 10*3/uL (ref 0.0–0.1)
Basophils Relative: 0 %
Eosinophils Absolute: 0 10*3/uL (ref 0.0–0.7)
Eosinophils Relative: 0 %
HCT: 34.9 % — ABNORMAL LOW (ref 36.0–46.0)
Hemoglobin: 11.5 g/dL — ABNORMAL LOW (ref 12.0–15.0)
Lymphocytes Relative: 3 %
Lymphs Abs: 0.3 10*3/uL — ABNORMAL LOW (ref 0.7–4.0)
MCH: 27.3 pg (ref 26.0–34.0)
MCHC: 33 g/dL (ref 30.0–36.0)
MCV: 82.9 fL (ref 78.0–100.0)
Monocytes Absolute: 1.3 10*3/uL — ABNORMAL HIGH (ref 0.1–1.0)
Monocytes Relative: 13 %
Neutro Abs: 8.5 10*3/uL — ABNORMAL HIGH (ref 1.7–7.7)
Neutrophils Relative %: 84 %
Platelets: 185 10*3/uL (ref 150–400)
RBC: 4.21 MIL/uL (ref 3.87–5.11)
RDW: 16.1 % — ABNORMAL HIGH (ref 11.5–15.5)
WBC: 10.2 10*3/uL (ref 4.0–10.5)

## 2016-11-11 LAB — TROPONIN I
Troponin I: 0.03 ng/mL (ref <0.03)
Troponin I: 0.04 ng/mL (ref <0.03)
Troponin I: 0.04 ng/mL (ref <0.03)

## 2016-11-11 LAB — BRAIN NATRIURETIC PEPTIDE: B Natriuretic Peptide: 1331.2 pg/mL — ABNORMAL HIGH (ref 0.0–100.0)

## 2016-11-11 MED ORDER — INSULIN ASPART 100 UNIT/ML ~~LOC~~ SOLN
0.0000 [IU] | Freq: Three times a day (TID) | SUBCUTANEOUS | Status: DC
  Administered 2016-11-11: 1 [IU] via SUBCUTANEOUS
  Administered 2016-11-12: 3 [IU] via SUBCUTANEOUS
  Administered 2016-11-12 (×2): 2 [IU] via SUBCUTANEOUS
  Administered 2016-11-13: 3 [IU] via SUBCUTANEOUS
  Administered 2016-11-13: 2 [IU] via SUBCUTANEOUS
  Administered 2016-11-13: 1 [IU] via SUBCUTANEOUS
  Administered 2016-11-14: 3 [IU] via SUBCUTANEOUS
  Administered 2016-11-14 (×2): 2 [IU] via SUBCUTANEOUS
  Administered 2016-11-15: 3 [IU] via SUBCUTANEOUS
  Administered 2016-11-15 (×2): 1 [IU] via SUBCUTANEOUS
  Administered 2016-11-16 (×2): 2 [IU] via SUBCUTANEOUS
  Administered 2016-11-16 – 2016-11-17 (×2): 3 [IU] via SUBCUTANEOUS
  Administered 2016-11-17: 1 [IU] via SUBCUTANEOUS

## 2016-11-11 MED ORDER — CARVEDILOL 6.25 MG PO TABS
6.2500 mg | ORAL_TABLET | Freq: Two times a day (BID) | ORAL | Status: DC
  Administered 2016-11-11 – 2016-11-17 (×12): 6.25 mg via ORAL
  Filled 2016-11-11 (×12): qty 1

## 2016-11-11 MED ORDER — FUROSEMIDE 10 MG/ML IJ SOLN
40.0000 mg | Freq: Once | INTRAMUSCULAR | Status: AC
  Administered 2016-11-11: 40 mg via INTRAVENOUS
  Filled 2016-11-11: qty 4

## 2016-11-11 MED ORDER — ENOXAPARIN SODIUM 40 MG/0.4ML ~~LOC~~ SOLN
40.0000 mg | SUBCUTANEOUS | Status: DC
  Administered 2016-11-11 – 2016-11-12 (×2): 40 mg via SUBCUTANEOUS
  Filled 2016-11-11 (×2): qty 0.4

## 2016-11-11 MED ORDER — SODIUM CHLORIDE 0.9% FLUSH
3.0000 mL | Freq: Two times a day (BID) | INTRAVENOUS | Status: DC
  Administered 2016-11-11 – 2016-11-16 (×10): 3 mL via INTRAVENOUS

## 2016-11-11 MED ORDER — ADULT MULTIVITAMIN W/MINERALS CH
1.0000 | ORAL_TABLET | Freq: Every day | ORAL | Status: DC
  Administered 2016-11-12 – 2016-11-17 (×6): 1 via ORAL
  Filled 2016-11-11 (×7): qty 1

## 2016-11-11 MED ORDER — FLUTICASONE PROPIONATE 50 MCG/ACT NA SUSP
1.0000 | Freq: Every day | NASAL | Status: DC
  Administered 2016-11-13 – 2016-11-16 (×4): 1 via NASAL
  Filled 2016-11-11: qty 16

## 2016-11-11 MED ORDER — RAMIPRIL 5 MG PO CAPS
5.0000 mg | ORAL_CAPSULE | Freq: Every day | ORAL | Status: DC
  Administered 2016-11-11 – 2016-11-14 (×4): 5 mg via ORAL
  Filled 2016-11-11 (×4): qty 1

## 2016-11-11 MED ORDER — DOXYCYCLINE HYCLATE 100 MG PO TABS
100.0000 mg | ORAL_TABLET | Freq: Two times a day (BID) | ORAL | Status: DC
  Administered 2016-11-11 – 2016-11-12 (×2): 100 mg via ORAL
  Filled 2016-11-11 (×2): qty 1

## 2016-11-11 MED ORDER — ONDANSETRON HCL 4 MG/2ML IJ SOLN
4.0000 mg | Freq: Four times a day (QID) | INTRAMUSCULAR | Status: DC | PRN
  Administered 2016-11-12: 4 mg via INTRAVENOUS
  Filled 2016-11-11: qty 2

## 2016-11-11 MED ORDER — FUROSEMIDE 10 MG/ML IJ SOLN
40.0000 mg | Freq: Two times a day (BID) | INTRAMUSCULAR | Status: DC
  Administered 2016-11-12 – 2016-11-13 (×3): 40 mg via INTRAVENOUS
  Filled 2016-11-11 (×3): qty 4

## 2016-11-11 MED ORDER — CLOPIDOGREL BISULFATE 75 MG PO TABS
75.0000 mg | ORAL_TABLET | Freq: Every day | ORAL | Status: DC
  Administered 2016-11-11 – 2016-11-17 (×7): 75 mg via ORAL
  Filled 2016-11-11 (×7): qty 1

## 2016-11-11 MED ORDER — PANTOPRAZOLE SODIUM 40 MG PO TBEC
40.0000 mg | DELAYED_RELEASE_TABLET | Freq: Every day | ORAL | Status: DC
  Administered 2016-11-11 – 2016-11-17 (×7): 40 mg via ORAL
  Filled 2016-11-11 (×7): qty 1

## 2016-11-11 MED ORDER — BUSPIRONE HCL 5 MG PO TABS
5.0000 mg | ORAL_TABLET | Freq: Two times a day (BID) | ORAL | Status: DC
  Administered 2016-11-11 – 2016-11-17 (×12): 5 mg via ORAL
  Filled 2016-11-11 (×12): qty 1

## 2016-11-11 NOTE — ED Provider Notes (Signed)
[facility] DEPT Provider Note   CSN: [id number] Arrival date & time: 11/11/16  1017     History   Chief Complaint Chief Complaint  Patient presents with  . Cough  . Nausea    HPI Kayler Buckholtz is a 83 y.o. female.   Cough     Elderly female presents with concern of worsening dyspnea, cough, fatigue. Onset was maybe one week ago, but noticeably worse over the past few days. Patient has multiple medical issues including congestive heart failure, CAD, diabetes. She notes that she takes all medication as directed including Lasix, 20 mg, twice daily. During this illness, no clear relieving or exacerbating factors. No sustained chest pain, though there is some discomfort with coughing.  Patient is notable recent life stress or of one child passing away, she traveled to [place] over the past few days.    Past Medical History:  Diagnosis Date  . Anemia   . CAD (coronary artery disease)   . Cancer [facility])    Breast cancer  . Diabetes mellitus   . Hypercholesteremia   . Hypertension   . Peripheral vascular disease ([place])   . Stroke [employer])    reports TIA 3-4 weeks ago    Patient Active Problem List   Diagnosis Date Noted  . Anemia of chronic disease 03/24/2016  . Chronic combined systolic and diastolic congestive heart failure ([place]) 03/24/2016  . Acute respiratory failure with hypoxia ([place]) 03/24/2016  . Scarring of lung following radiation- RLL 03/24/2016  . Essential hypertension   . Acute encephalopathy 12/13/2015  . Hyponatremia 12/13/2015  . Hypokalemia 12/13/2015  . UTI (lower urinary tract infection) 12/13/2015  . Leukocytosis 12/13/2015  . Hypercholesteremia   . Diabetes mellitus without complication ([place])   . Stroke ([place])   . CAD (coronary artery disease)   . Peripheral vascular disease ([place])   . Carotid stenosis 03/13/2015    Past Surgical History:  Procedure Laterality Date  . ABDOMINAL HYSTERECTOMY    . APPENDECTOMY  1951  . [address]  . CHOLECYSTECTOMY  2003   By Dr. Excell Seltzer  . EYE SURGERY Bilateral 2009   Cataract surgery  . FRACTURE SURGERY Right    ORIF   by Dr. Burney Gauze  . HUMERUS FRACTURE SURGERY    . MASTECTOMY Bilateral   . NASAL SEPTUM SURGERY  1977   Dr. Ernesto Rutherford  . TOENAIL EXCISION      OB History    No data available       Home Medications    Prior to Admission medications   Medication Sig Start Date End Date Taking? Authorizing Provider  acetaminophen (TYLENOL) 500 MG tablet Take 1,000 mg by mouth every 6 (six) hours as needed for moderate pain or headache.   Yes Historical Provider, MD  atorvastatin (LIPITOR) 80 MG tablet Take 80 mg by mouth at bedtime.    Yes Historical Provider, MD  BIOTIN PO Take 1 tablet by mouth daily.   Yes Historical Provider, MD  busPIRone (BUSPAR) 5 MG tablet Take 1 tablet (5 mg total) by mouth 2 (two) times daily. 12/18/15  Yes Reyne Dumas, MD  carvedilol (COREG) 6.25 MG tablet Take 1 tablet (6.25 mg total) by mouth 2 (two) times daily. 10/08/16  Yes Skeet Latch, MD  cholecalciferol (VITAMIN D) 1000 units tablet Take 1,000 Units by mouth every evening.    Yes Historical Provider, MD  clopidogrel (PLAVIX) 75 MG tablet Take 75 mg by mouth daily.   Yes Historical  Provider, MD  doxycycline (VIBRA-TABS) 100 MG tablet Take 100 mg by mouth 2 (two) times daily. 7 day course started 11/09/16 11/09/16  Yes Historical Provider, MD  fluticasone (FLONASE) 50 MCG/ACT nasal spray Place into both nostrils as needed for allergies or rhinitis.   Yes Historical Provider, MD  FOLIC ACID PO Take 1 tablet by mouth daily.   Yes Historical Provider, MD  furosemide (LASIX) 20 MG tablet Take 20 mg by mouth daily.  09/04/16  Yes Historical Provider, MD  meclizine (ANTIVERT) 25 MG tablet Take 1 tablet (25 mg total) by mouth 3 (three) times daily as needed for dizziness. 10/08/16  Yes Skeet Latch, MD  Melatonin 5 MG TABS Take 10 mg by mouth at bedtime as needed (sleep).   Yes  Historical Provider, MD  Multiple Vitamin (MULTIVITAMIN WITH MINERALS) TABS tablet Take 1 tablet by mouth daily.    Yes Historical Provider, MD  omeprazole (PRILOSEC) 20 MG capsule Take 20 mg by mouth daily. 09/20/16  Yes Historical Provider, MD  ramipril (ALTACE) 5 MG capsule Take 1 capsule (5 mg total) by mouth daily. Patient taking differently: Take 5 mg by mouth 2 (two) times daily.  10/08/16 01/06/17 Yes Skeet Latch, MD  vitamin C (ASCORBIC ACID) 500 MG tablet Take 1,000 mg by mouth daily.   Yes Historical Provider, MD    Family History Family History  Problem Relation Age of Onset  . Cancer Mother   . Pulmonary embolism Mother   . Heart disease Father   . Heart attack Father   . Stroke Father   . Diabetes Brother   . Hyperlipidemia Brother   . Stroke Brother   . Kidney disease Brother     Social History Social History  Substance Use Topics  . Smoking status: Former Smoker    Quit date: 08/24/1978  . Smokeless tobacco: Never Used  . Alcohol use No     Allergies   Morphine and related; Other; Sulfa antibiotics; and Nickel   Review of Systems Review of Systems  Constitutional:       Per HPI, otherwise negative  HENT:       Patient has recent laryngitis  Respiratory: Positive for cough.        Per HPI, otherwise negative  Cardiovascular:       Per HPI, otherwise negative  Gastrointestinal: Negative for vomiting.  Endocrine:       Negative aside from HPI  Genitourinary:       Neg aside from HPI   Musculoskeletal:       Per HPI, otherwise negative  Skin: Negative.   Neurological: Negative for syncope.     Physical Exam Updated Vital Signs BP (!) 166/95   Pulse 91   Temp 97.7 F (36.5 C) (Oral)   Resp (!) 24   Ht 5\' 3"  (1.6 m)   Wt 137 lb (62.1 kg)   SpO2 98%   BMI 24.27 kg/m   Physical Exam  Constitutional: She is oriented to person, place, and time. She has a sickly appearance. No distress.  HENT:  Head: Normocephalic and atraumatic.  Eyes:  Conjunctivae and EOM are normal.  Cardiovascular: Normal rate and regular rhythm.   Pulmonary/Chest: No stridor. Tachypnea noted. She has decreased breath sounds. She has rhonchi.  Nasal cannula in place, 3 L  Abdominal: She exhibits no distension.  Musculoskeletal: She exhibits no edema.  Neurological: She is alert and oriented to person, place, and time. No cranial nerve deficit.  Skin: Skin is  warm and dry.  Psychiatric: She has a normal mood and affect.  Nursing note and vitals reviewed.    ED Treatments / Results  Labs (all labs ordered are listed, but only abnormal results are displayed) Labs Reviewed  COMPREHENSIVE METABOLIC PANEL - Abnormal; Notable for the following:       Result Value   Chloride 94 (*)    Glucose, Bld 154 (*)    BUN 37 (*)    AST 337 (*)    ALT 438 (*)    GFR calc non Af Amer 57 (*)    All other components within normal limits  CBC WITH DIFFERENTIAL/PLATELET - Abnormal; Notable for the following:    Hemoglobin 11.5 (*)    HCT 34.9 (*)    RDW 16.1 (*)    Neutro Abs 8.5 (*)    Lymphs Abs 0.3 (*)    Monocytes Absolute 1.3 (*)    All other components within normal limits  TROPONIN I - Abnormal; Notable for the following:    Troponin I 0.03 (*)    All other components within normal limits  BRAIN NATRIURETIC PEPTIDE - Abnormal; Notable for the following:    B Natriuretic Peptide 1,331.2 (*)    All other components within normal limits    EKG  EKG Interpretation  Date/Time:  Wednesday November 11 2016 11:00:40 EDT Ventricular Rate:  89 PR Interval:    QRS Duration: 99 QT Interval:  403 QTC Calculation: 491 R Axis:   83 Text Interpretation:  Sinus rhythm Probable left atrial enlargement Borderline right axis deviation Borderline T abnormalities, lateral leads Borderline prolonged QT interval Abnormal ekg Confirmed by Carmin Muskrat  MD [phone]) on 11/11/2016 11:08:20 AM Also confirmed by Carmin Muskrat  MD ([place]), editor [place], Leda Gauze [phone])   on 11/11/2016 12:06:31 PM       Radiology Dg Chest 2 View  Result Date: 11/11/2016 CLINICAL DATA:  Cough, congestion, shortness of breath over the last week. EXAM: CHEST  2 VIEW COMPARISON:  03/20/2016 FINDINGS: Mild cardiomegaly. There is hyperinflation of the lungs compatible with COPD. Chronic densities in the right upper lobe and right lung base, increased since prior study. Cannot exclude superimposed pneumonia. No confluent opacity on the left. Small right effusion suspected. IMPRESSION: Chronic opacities in the right upper lobe and right lung base have increased since prior study. There is likely a component of underlying scarring. But cannot exclude superimposed pneumonia. Small right effusion. COPD. Cardiomegaly. Electronically Signed   By: Rolm Baptise M.D.   On: 11/11/2016 11:18   Initial labs notable for elevated BNP, concerning for worsening congestive heart failure, particularly given the patient's use of nasal cannula were to maintain appropriate oxygen saturation.  Procedures Procedures (including critical care time)  Medications Ordered in ED Medications  furosemide (LASIX) injection 40 mg (not administered)     Initial Impression / Assessment and Plan / ED Course  I have reviewed the triage vital signs and the nursing notes.  Pertinent labs & imaging results that were available during my care of the patient were reviewed by me and considered in my medical decision making (see chart for details).  On repeat exam the patient is awake and alert. I discussed all findings with her and her daughter. With concern for worsening congestive heart failure, ongoing tachypnea, patient will receive IV Lasix, be admitted for further evaluation and management.   Final Clinical Impressions(s) / ED Diagnoses  Acute exacerbation of congestive heart failure   Carmin Muskrat, MD  11/11/16 1343  

## 2016-11-11 NOTE — ED Notes (Signed)
Bed: HD89 Expected date:  Expected time:  Means of arrival:  Comments: EMS-cough/has been treated

## 2016-11-11 NOTE — ED Triage Notes (Signed)
Per EMS- patient lives at home. Patient wears Home O2 3L/min via [place]. Patient saw her PCP 3 days ago and was prescribed an antibiotic for c/o cough. Patient continiues to cough and having brown sputum. Patient also c/o hoarseness and nausea. EMS gave Zofran 4 mg IV prior to arrival to the ED.

## 2016-11-11 NOTE — ED Notes (Signed)
Patient transported to X-ray 

## 2016-11-11 NOTE — H&P (Addendum)
History and Physical    Helen [name] EKC:[id number] DOB: 29-Jul-1930 DOA: 11/11/2016  I have briefly reviewed the patient's prior medical records in [place]  PCP: Anthoney Harada, MD  Patient coming from: [facility]  Chief Complaint: Shortness of breath  HPI: Helen [name] is a 83 y.o. female with medical history significant of diabetes, hypertension, hyperlipidemia, coronary artery disease, chronic combined systolic and diastolic CHF, presents to the emergency room with chief complaint of progressive shortness of breath over the last 3-4 days.  Patient reports that she has been found to be aggressively dyspneic with little activity around her house, and is not able to do as much as she used to.  She recently had a life stress of 1 child passing away and she had to travel to [place] and skipped her Lasix for couple of days.  She denies any weight gain, she denies any swelling in her lower extremities.  She states that she has been having a sore throat a few days ago, and she has been losing her voice.  She was seen in her PCPs office a few days ago when she was found to have a fever and was placed on doxycycline for presumed pneumonia.  She reports a cough which has been productive of brown sputum at times.  She reports having chills at home.  She denies any chest pain.  She is also been complaining of nausea, which is chronic, but denies any abdominal pain or vomiting.  ED Course: In the ED, her vital signs are stable, she is tachypneic and satting in the 90s on 3 L nasal cannula.  Her blood work is remarkable for liver enzyme elevation as well as an elevated BNP to 1300.  Review of Systems: As per HPI otherwise 10 point review of systems negative.   Past Medical History:  Diagnosis Date  . Anemia   . CAD (coronary artery disease)   . Cancer [facility])    Breast cancer  . Diabetes mellitus   . Hypercholesteremia   . Hypertension   . Peripheral vascular disease ([place])    . Stroke [facility])    reports TIA 3-4 weeks ago    Past Surgical History:  Procedure Laterality Date  . ABDOMINAL HYSTERECTOMY    . APPENDECTOMY  1951  . [place]  . CHOLECYSTECTOMY  2003   By Dr. Excell Seltzer  . EYE SURGERY Bilateral 2009   Cataract surgery  . FRACTURE SURGERY Right    ORIF   by Dr. Burney Gauze  . HUMERUS FRACTURE SURGERY    . MASTECTOMY Bilateral   . NASAL SEPTUM SURGERY  1977   Dr. Ernesto Rutherford  . TOENAIL EXCISION       reports that she quit smoking about 38 years ago. She has never used smokeless tobacco. She reports that she does not drink alcohol or use drugs.  Allergies  Allergen Reactions  . Morphine And Related Nausea And Vomiting  . Other Other (See Comments)    Barley & Carrots: learned through allergy testing. Unknown reaction-- MD said not very allergic but not to eat large quantities  . Sulfa Antibiotics Hives  . Nickel Rash    Family History  Problem Relation Age of Onset  . Cancer Mother   . Pulmonary embolism Mother   . Heart disease Father   . Heart attack Father   . Stroke Father   . Diabetes Brother   . Hyperlipidemia Brother   . Stroke Brother   .  Kidney disease Brother     Prior to Admission medications   Medication Sig Start Date End Date Taking? Authorizing Provider  acetaminophen (TYLENOL) 500 MG tablet Take 1,000 mg by mouth every 6 (six) hours as needed for moderate pain or headache.   Yes Historical Provider, MD  atorvastatin (LIPITOR) 80 MG tablet Take 80 mg by mouth at bedtime.    Yes Historical Provider, MD  BIOTIN PO Take 1 tablet by mouth daily.   Yes Historical Provider, MD  busPIRone (BUSPAR) 5 MG tablet Take 1 tablet (5 mg total) by mouth 2 (two) times daily. 12/18/15  Yes Reyne Dumas, MD  carvedilol (COREG) 6.25 MG tablet Take 1 tablet (6.25 mg total) by mouth 2 (two) times daily. 10/08/16  Yes Skeet Latch, MD  cholecalciferol (VITAMIN D) 1000 units tablet Take 1,000 Units by mouth every evening.     Yes Historical Provider, MD  clopidogrel (PLAVIX) 75 MG tablet Take 75 mg by mouth daily.   Yes Historical Provider, MD  doxycycline (VIBRA-TABS) 100 MG tablet Take 100 mg by mouth 2 (two) times daily. 7 day course started 11/09/16 11/09/16  Yes Historical Provider, MD  fluticasone (FLONASE) 50 MCG/ACT nasal spray Place into both nostrils as needed for allergies or rhinitis.   Yes Historical Provider, MD  FOLIC ACID PO Take 1 tablet by mouth daily.   Yes Historical Provider, MD  furosemide (LASIX) 20 MG tablet Take 20 mg by mouth daily.  09/04/16  Yes Historical Provider, MD  meclizine (ANTIVERT) 25 MG tablet Take 1 tablet (25 mg total) by mouth 3 (three) times daily as needed for dizziness. 10/08/16  Yes Skeet Latch, MD  Melatonin 5 MG TABS Take 10 mg by mouth at bedtime as needed (sleep).   Yes Historical Provider, MD  Multiple Vitamin (MULTIVITAMIN WITH MINERALS) TABS tablet Take 1 tablet by mouth daily.    Yes Historical Provider, MD  omeprazole (PRILOSEC) 20 MG capsule Take 20 mg by mouth daily. 09/20/16  Yes Historical Provider, MD  ramipril (ALTACE) 5 MG capsule Take 1 capsule (5 mg total) by mouth daily. Patient taking differently: Take 5 mg by mouth 2 (two) times daily.  10/08/16 01/06/17 Yes Skeet Latch, MD  vitamin C (ASCORBIC ACID) 500 MG tablet Take 1,000 mg by mouth daily.   Yes Historical Provider, MD    Physical Exam: Vitals:   11/11/16 1046 11/11/16 1200 11/11/16 1230 11/11/16 1300  BP: (!) 121/98 (!) 154/92 (!) 162/83 (!) 166/95  Pulse: 90 87 87 91  Resp: 18 (!) 23 (!) 23 (!) 24  Temp:      TempSrc:      SpO2: 98% 100% 97% 98%  Weight:      Height:          Constitutional: NAD, calm, comfortable, Vitals:   11/11/16 1046 11/11/16 1200 11/11/16 1230 11/11/16 1300  BP: (!) 121/98 (!) 154/92 (!) 162/83 (!) 166/95  Pulse: 90 87 87 91  Resp: 18 (!) 23 (!) 23 (!) 24  Temp:      TempSrc:      SpO2: 98% 100% 97% 98%  Weight:      Height:       Eyes: PERRL, lids  and conjunctivae normal ENMT: Mucous membranes are moist. Posterior pharynx clear of any exudate or lesions.Normal dentition.  Neck: normal, supple, no masses, no thyromegaly Respiratory: Overall decreased breath sounds bilaterally, no wheezing, faint bibasilar crackles Cardiovascular: Regular rate and rhythm. No extremity edema. Abdomen: no tenderness, no masses  palpated. Bowel sounds positive.  Musculoskeletal: no clubbing / cyanosis. Normal muscle tone.  Skin: no rashes, lesions, ulcers. No induration Neurologic: CN 2-12 grossly intact. Strength 5/5 in all 4.  Psychiatric: Normal judgment and insight. Alert and oriented x 3. Normal mood.   Labs on Admission: I have personally reviewed following labs and imaging studies  CBC:  Recent Labs Lab 11/11/16 1132  WBC 10.2  NEUTROABS 8.5*  HGB 11.5*  HCT 34.9*  MCV 82.9  PLT 098   Basic Metabolic Panel:  Recent Labs Lab 11/11/16 1132  NA 136  K 4.1  CL 94*  CO2 31  GLUCOSE 154*  BUN 37*  CREATININE 0.90  CALCIUM 9.1   GFR: Estimated Creatinine Clearance: 37.8 mL/min (by C-G formula based on SCr of 0.9 mg/dL). Liver Function Tests:  Recent Labs Lab 11/11/16 1132  AST 337*  ALT 438*  ALKPHOS 123  BILITOT 1.1  PROT 7.5  ALBUMIN 3.6   No results for input(s): LIPASE, AMYLASE in the last 168 hours. No results for input(s): AMMONIA in the last 168 hours. Coagulation Profile: No results for input(s): INR, PROTIME in the last 168 hours. Cardiac Enzymes:  Recent Labs Lab 11/11/16 1132  TROPONINI 0.03*   BNP (last 3 results) No results for input(s): PROBNP in the last 8760 hours. HbA1C: No results for input(s): HGBA1C in the last 72 hours. CBG: No results for input(s): GLUCAP in the last 168 hours. Lipid Profile: No results for input(s): CHOL, HDL, LDLCALC, TRIG, CHOLHDL, LDLDIRECT in the last 72 hours. Thyroid Function Tests: No results for input(s): TSH, T4TOTAL, FREET4, T3FREE, THYROIDAB in the last 72  hours. Anemia Panel: No results for input(s): VITAMINB12, FOLATE, FERRITIN, TIBC, IRON, RETICCTPCT in the last 72 hours. Urine analysis:    Component Value Date/Time   COLORURINE YELLOW 03/17/2016 1740   APPEARANCEUR CLEAR 03/17/2016 1740   LABSPEC 1.009 03/17/2016 1740   PHURINE 7.0 03/17/2016 1740   GLUCOSEU NEGATIVE 03/17/2016 1740   HGBUR NEGATIVE 03/17/2016 1740   BILIRUBINUR NEGATIVE 03/17/2016 1740   KETONESUR NEGATIVE 03/17/2016 1740   PROTEINUR NEGATIVE 03/17/2016 1740   NITRITE NEGATIVE 03/17/2016 1740   LEUKOCYTESUR NEGATIVE 03/17/2016 1740     Radiological Exams on Admission: Dg Chest 2 View  Result Date: 11/11/2016 CLINICAL DATA:  Cough, congestion, shortness of breath over the last week. EXAM: CHEST  2 VIEW COMPARISON:  03/20/2016 FINDINGS: Mild cardiomegaly. There is hyperinflation of the lungs compatible with COPD. Chronic densities in the right upper lobe and right lung base, increased since prior study. Cannot exclude superimposed pneumonia. No confluent opacity on the left. Small right effusion suspected. IMPRESSION: Chronic opacities in the right upper lobe and right lung base have increased since prior study. There is likely a component of underlying scarring. But cannot exclude superimposed pneumonia. Small right effusion. COPD. Cardiomegaly. Electronically Signed   By: Rolm Baptise M.D.   On: 11/11/2016 11:18    EKG: Independently reviewed. Sinus rhythm  Assessment/Plan Active Problems:   Hypercholesteremia   Diabetes mellitus without complication (HCC)   CAD (coronary artery disease)   Essential hypertension   Acute respiratory failure with hypoxia (HCC)   Shortness of breath   Chronic respiratory failure with hypoxia (HCC)    Acute on chronic hypoxic respiratory failure -Her baseline oxygen at home is in between 1-3 L depending on activity level -Patient has some evidence of fluid overload however not that impressive on physical exam. Her BNP is  elevated, and she missed her Lasix  a couple of days while traveling to [place], and it is reasonable to do IV Lasix for at least tomorrow. -Chest x-ray raises the possibility of superimposed pneumonia, cannot completely rule that out given subjective fever and chills as well as a productive cough. She was started on doxycycline as an outpatient, continue  Elevated liver enzymes -Patient without history of the same, she has nausea however that is chronic. She reports occasional right upper quadrant tenderness again that's been chronic. We'll obtain a right upper quadrant ultrasound to evaluate liver, obtain hepatitis panel. Hold statin for now. Unclear etiology of her elevated LFTs but she is relatively asymptomatic  Acute on chronic combined systolic and diastolic CHF -Diuresis as above, strict I's and O's, daily weights  Pneumonia -Started on doxycycline as an outpatient, continue  Diabetes mellitus -She is not on any medication, suspect diet controlled, most recent hemoglobin A1c was 6.8 -Sliding-scale insulin  Coronary artery disease -Troponin slightly elevated to 0.03, not in a pattern consistent with ACS and likely due to demand, however we'll cycle.  -She recently had a stress test on February 2018, and findings were pertinent for depressed ejection fraction about 30%, and a medium defect of moderate severity which is nonreversible.  Hyperlipidemia -Hold statin as above due to elevated LFTs   DVT prophylaxis: Lovenox Code Status: Full code Family Communication: Discussed with daughter at bedside Disposition Plan: Admit to telemetry Consults called: None    Admission status: Observation  At the point of initial evaluation, it is my clinical opinion that admission for OBSERVATION is reasonable and necessary because the patient's presenting complaints in the context of their chronic conditions represent sufficient risk of deterioration or significant morbidity to constitute  reasonable grounds for close observation in the hospital setting, but that the patient may be medically stable for discharge from the hospital within 24 to 48 hours.   Marzetta Board, MD [facility] Pager [phone]  If 7PM-7AM, please contact night-coverage [email or web address] Password [facility]  11/11/2016, 2:04 PM

## 2016-11-12 ENCOUNTER — Ambulatory Visit: Payer: Self-pay

## 2016-11-12 DIAGNOSIS — Z833 Family history of diabetes mellitus: Secondary | ICD-10-CM | POA: Diagnosis not present

## 2016-11-12 DIAGNOSIS — N179 Acute kidney failure, unspecified: Secondary | ICD-10-CM | POA: Diagnosis not present

## 2016-11-12 DIAGNOSIS — Z87891 Personal history of nicotine dependence: Secondary | ICD-10-CM | POA: Diagnosis not present

## 2016-11-12 DIAGNOSIS — J9601 Acute respiratory failure with hypoxia: Secondary | ICD-10-CM | POA: Diagnosis not present

## 2016-11-12 DIAGNOSIS — I2510 Atherosclerotic heart disease of native coronary artery without angina pectoris: Secondary | ICD-10-CM | POA: Diagnosis present

## 2016-11-12 DIAGNOSIS — J189 Pneumonia, unspecified organism: Secondary | ICD-10-CM | POA: Diagnosis not present

## 2016-11-12 DIAGNOSIS — Z841 Family history of disorders of kidney and ureter: Secondary | ICD-10-CM | POA: Diagnosis not present

## 2016-11-12 DIAGNOSIS — R748 Abnormal levels of other serum enzymes: Secondary | ICD-10-CM | POA: Diagnosis not present

## 2016-11-12 DIAGNOSIS — R0602 Shortness of breath: Secondary | ICD-10-CM | POA: Diagnosis present

## 2016-11-12 DIAGNOSIS — Z9049 Acquired absence of other specified parts of digestive tract: Secondary | ICD-10-CM | POA: Diagnosis not present

## 2016-11-12 DIAGNOSIS — I5023 Acute on chronic systolic (congestive) heart failure: Secondary | ICD-10-CM | POA: Diagnosis not present

## 2016-11-12 DIAGNOSIS — R7989 Other specified abnormal findings of blood chemistry: Secondary | ICD-10-CM | POA: Diagnosis present

## 2016-11-12 DIAGNOSIS — Z8249 Family history of ischemic heart disease and other diseases of the circulatory system: Secondary | ICD-10-CM | POA: Diagnosis not present

## 2016-11-12 DIAGNOSIS — Z823 Family history of stroke: Secondary | ICD-10-CM | POA: Diagnosis not present

## 2016-11-12 DIAGNOSIS — Z9981 Dependence on supplemental oxygen: Secondary | ICD-10-CM | POA: Diagnosis not present

## 2016-11-12 DIAGNOSIS — E1151 Type 2 diabetes mellitus with diabetic peripheral angiopathy without gangrene: Secondary | ICD-10-CM | POA: Diagnosis present

## 2016-11-12 DIAGNOSIS — Z90710 Acquired absence of both cervix and uterus: Secondary | ICD-10-CM | POA: Diagnosis not present

## 2016-11-12 DIAGNOSIS — I5043 Acute on chronic combined systolic (congestive) and diastolic (congestive) heart failure: Secondary | ICD-10-CM | POA: Diagnosis not present

## 2016-11-12 DIAGNOSIS — Z853 Personal history of malignant neoplasm of breast: Secondary | ICD-10-CM | POA: Diagnosis not present

## 2016-11-12 DIAGNOSIS — E119 Type 2 diabetes mellitus without complications: Secondary | ICD-10-CM | POA: Diagnosis not present

## 2016-11-12 DIAGNOSIS — G9341 Metabolic encephalopathy: Secondary | ICD-10-CM | POA: Diagnosis not present

## 2016-11-12 DIAGNOSIS — Z9013 Acquired absence of bilateral breasts and nipples: Secondary | ICD-10-CM | POA: Diagnosis not present

## 2016-11-12 DIAGNOSIS — Z923 Personal history of irradiation: Secondary | ICD-10-CM | POA: Diagnosis not present

## 2016-11-12 DIAGNOSIS — J9621 Acute and chronic respiratory failure with hypoxia: Secondary | ICD-10-CM | POA: Diagnosis present

## 2016-11-12 DIAGNOSIS — R410 Disorientation, unspecified: Secondary | ICD-10-CM | POA: Diagnosis not present

## 2016-11-12 DIAGNOSIS — Z809 Family history of malignant neoplasm, unspecified: Secondary | ICD-10-CM | POA: Diagnosis not present

## 2016-11-12 DIAGNOSIS — G9340 Encephalopathy, unspecified: Secondary | ICD-10-CM | POA: Diagnosis not present

## 2016-11-12 DIAGNOSIS — E785 Hyperlipidemia, unspecified: Secondary | ICD-10-CM | POA: Diagnosis present

## 2016-11-12 DIAGNOSIS — I110 Hypertensive heart disease with heart failure: Secondary | ICD-10-CM | POA: Diagnosis present

## 2016-11-12 DIAGNOSIS — Z8673 Personal history of transient ischemic attack (TIA), and cerebral infarction without residual deficits: Secondary | ICD-10-CM | POA: Diagnosis not present

## 2016-11-12 DIAGNOSIS — E7800 Pure hypercholesterolemia, unspecified: Secondary | ICD-10-CM | POA: Diagnosis present

## 2016-11-12 LAB — URINALYSIS, ROUTINE W REFLEX MICROSCOPIC
Bilirubin Urine: NEGATIVE
Glucose, UA: NEGATIVE mg/dL
Hgb urine dipstick: NEGATIVE
Ketones, ur: NEGATIVE mg/dL
Leukocytes, UA: NEGATIVE
Nitrite: NEGATIVE
Protein, ur: 100 mg/dL — AB
Specific Gravity, Urine: 1.015 (ref 1.005–1.030)
pH: 5 (ref 5.0–8.0)

## 2016-11-12 LAB — COMPREHENSIVE METABOLIC PANEL
ALT: 316 U/L — ABNORMAL HIGH (ref 14–54)
AST: 177 U/L — ABNORMAL HIGH (ref 15–41)
Albumin: 3 g/dL — ABNORMAL LOW (ref 3.5–5.0)
Alkaline Phosphatase: 157 U/L — ABNORMAL HIGH (ref 38–126)
Anion gap: 9 (ref 5–15)
BUN: 46 mg/dL — ABNORMAL HIGH (ref 6–20)
CO2: 31 mmol/L (ref 22–32)
Calcium: 8.7 mg/dL — ABNORMAL LOW (ref 8.9–10.3)
Chloride: 95 mmol/L — ABNORMAL LOW (ref 101–111)
Creatinine, Ser: 0.99 mg/dL (ref 0.44–1.00)
GFR calc Af Amer: 59 mL/min — ABNORMAL LOW (ref >60)
GFR calc non Af Amer: 51 mL/min — ABNORMAL LOW (ref >60)
Glucose, Bld: 189 mg/dL — ABNORMAL HIGH (ref 65–99)
Potassium: 4.4 mmol/L (ref 3.5–5.1)
Sodium: 135 mmol/L (ref 135–145)
Total Bilirubin: 0.9 mg/dL (ref 0.3–1.2)
Total Protein: 6.5 g/dL (ref 6.5–8.1)

## 2016-11-12 LAB — CBC
HCT: 33.6 % — ABNORMAL LOW (ref 36.0–46.0)
Hemoglobin: 10.9 g/dL — ABNORMAL LOW (ref 12.0–15.0)
MCH: 27.7 pg (ref 26.0–34.0)
MCHC: 32.4 g/dL (ref 30.0–36.0)
MCV: 85.5 fL (ref 78.0–100.0)
Platelets: 171 10*3/uL (ref 150–400)
RBC: 3.93 MIL/uL (ref 3.87–5.11)
RDW: 16.4 % — ABNORMAL HIGH (ref 11.5–15.5)
WBC: 12.1 10*3/uL — ABNORMAL HIGH (ref 4.0–10.5)

## 2016-11-12 LAB — RESPIRATORY PANEL BY PCR

## 2016-11-12 LAB — GLUCOSE, CAPILLARY
Glucose-Capillary: 181 mg/dL — ABNORMAL HIGH (ref 65–99)
Glucose-Capillary: 218 mg/dL — ABNORMAL HIGH (ref 65–99)
Glucose-Capillary: 156 mg/dL — ABNORMAL HIGH (ref 65–99)
Glucose-Capillary: 132 mg/dL — ABNORMAL HIGH (ref 65–99)

## 2016-11-12 LAB — EXPECTORATED SPUTUM ASSESSMENT W GRAM STAIN, RFLX TO RESP C

## 2016-11-12 LAB — TROPONIN I: Troponin I: 0.03 ng/mL (ref <0.03)

## 2016-11-12 LAB — STREP PNEUMONIAE URINARY ANTIGEN: Strep Pneumo Urinary Antigen: NEGATIVE

## 2016-11-12 LAB — MRSA PCR SCREENING: MRSA by PCR: NEGATIVE

## 2016-11-12 MED ORDER — DEXTROSE 5 % IV SOLN
1.0000 g | INTRAVENOUS | Status: DC
  Administered 2016-11-12 – 2016-11-14 (×3): 1 g via INTRAVENOUS
  Filled 2016-11-12 (×4): qty 10

## 2016-11-12 MED ORDER — IPRATROPIUM-ALBUTEROL 0.5-2.5 (3) MG/3ML IN SOLN
3.0000 mL | Freq: Two times a day (BID) | RESPIRATORY_TRACT | Status: DC
  Administered 2016-11-12 – 2016-11-17 (×10): 3 mL via RESPIRATORY_TRACT
  Filled 2016-11-12 (×10): qty 3

## 2016-11-12 MED ORDER — AZITHROMYCIN 250 MG PO TABS
500.0000 mg | ORAL_TABLET | Freq: Every day | ORAL | Status: DC
  Administered 2016-11-12 – 2016-11-15 (×4): 500 mg via ORAL
  Filled 2016-11-12 (×4): qty 2

## 2016-11-12 MED ORDER — GUAIFENESIN-DM 100-10 MG/5ML PO SYRP
5.0000 mL | ORAL_SOLUTION | ORAL | Status: DC | PRN
  Administered 2016-11-12 – 2016-11-14 (×4): 5 mL via ORAL
  Filled 2016-11-12 (×4): qty 10

## 2016-11-12 NOTE — Progress Notes (Signed)
Nutrition Education Note  RD consulted for nutrition education regarding low sodium diet.   RD provided "Low Sodium Nutrition Therapy," "Sodium Content of Foods," and "Sodium-Free Flavoring Tips" handouts from the Academy of [employer] and Dietetics. Pt with hx of HTN and CHF. She is only able to whisper and states that she has lost her voice d/t ongoing coughing. Pt reports that she lives at an [employer] and that all meals are prepared for her there. She denies being able to share with the food service staff or other staff on site that she needs to be provided with low sodium diet or low sodium options. Encouraged pt to choose lower sodium foods when she is not eating at home and when snacking. Also encouraged her to avoid using the salt shaker.  Pt reports not feeling up to further discussion; handouts left at bedside. RD discussed why it is important for patient to adhere to diet recommendations, and emphasized the role of fluids, foods to avoid, and importance of weighing self daily. Teach back method used. Pt reports not feeling up to further discussion; handouts left at bedside.  Expect fair to poor based on inability to self-select meal options at [facility] compliance.  Body mass index is 24.66 kg/m. Pt meets criteria for normal weight based on current BMI.  Current diet order is Heart Healthy/Carb Modified. Labs and medications reviewed. No further nutrition interventions warranted at this time. RD contact information provided. If additional nutrition issues arise, please re-consult RD.    Jarome Matin, MS, RD, LDN, [facility] Inpatient Clinical Dietitian Pager # [phone] After hours/weekend pager # [phone]

## 2016-11-12 NOTE — Progress Notes (Signed)
PROGRESS NOTE  Helen [name] TDD:[id number] DOB: Feb 21, 1930 DOA: 11/11/2016 PCP: Anthoney Harada, MD  HPI/Recap of past 24 hours:  Productive cough, hoarse, on 3liter oxygen ( report baseline 2-3 liter) Edema has improved, denies chest pain, no fever She is sitting up in chair, Daughter at bedside  Assessment/Plan: Active Problems:   Hypercholesteremia   Diabetes mellitus without complication ([place])   CAD (coronary artery disease)   Essential hypertension   Acute respiratory failure with hypoxia (HCC)   Shortness of breath   Chronic respiratory failure with hypoxia (HCC)  Acute on chronic hypoxic respiratory failure from acute on chronic combined chf =/- superimposed pna -Her baseline oxygen at home is in between 1-3 L depending on activity level -Patient has some evidence of fluid overload however not that impressive on physical exam. Her BNP is elevated, and she missed her Lasix a couple of days while traveling to [place], she is started on iv lasix 40mg  bid since admission, she seems improving on this ,will continue -Chest x-ray raises the possibility of superimposed pneumonia, cannot completely rule that out given subjective fever and chills as well as a productive cough. She was started on doxycycline as an outpatient did not show any improvement, will change to rocephin/zithro, sputum culture pending, respiratory viral panel negative, mrsa screen negative  Elevated liver enzymes -Patient without history of the same, she has nausea however that is chronic. She reports occasional right upper quadrant tenderness again that's been chronic.  -hepatitis panel pending,  right upper quadrant ultrasound showed patient is s/p cholecystectomy, some concerns of cirrhosis, right pleural effusion -. Hold statin for now.  -elevated liver enzymes possibly due to liver congestion in the setting of heart failure  Acute on chronic combined systolic and diastolic CHF -Diuresis as above,  strict I's and O's, daily weights  Pneumonia -Started rocephin/zitrho  Diabetes mellitus -She is not on any medication, suspect diet controlled, most recent hemoglobin A1c was 6.8 -Sliding-scale insulin  Coronary artery disease -Troponin slightly elevated to 0.03, not in a pattern consistent with ACS and likely due to demand, however we'll cycle.  -She recently had a stress test on February 2018, and findings were pertinent for depressed ejection fraction about 30%, and a medium defect of moderate severity which is nonreversible.  Hyperlipidemia -Hold statin as above due to elevated LFTs   DVT prophylaxis: Lovenox Code Status: Full code  Family Communication: patient and daughter Dorian Pod at bedside  Disposition Plan: pending, return to independent living with home health vs SNF depend on patient 's improvement   Consultants:  none  Procedures:  none  Antibiotics:  Doxycycline from admission to 3/22  Rocephin/zitrhomax from 3/22    Objective: BP 120/68 (BP Location: Left Arm)   Pulse 70   Temp 97.5 F (36.4 C) (Oral)   Resp 20   Ht 5\' 3"  (1.6 m)   Wt 63.1 kg (139 lb 3.2 oz)   SpO2 (!) 84%   BMI 24.66 kg/m   Intake/Output Summary (Last 24 hours) at 11/12/16 1224 Last data filed at 11/12/16 0723  Gross per 24 hour  Intake                0 ml  Output              650 ml  Net             -650 ml   Filed Weights   11/11/16 1041 11/11/16 1645 11/12/16 0445  Weight: 62.1 kg (137  lb) 62.9 kg (138 lb 10.7 oz) 63.1 kg (139 lb 3.2 oz)    Exam:   General:  Frail, hoarse, but NAD  Cardiovascular: RRR  Respiratory: diminished right basis  Abdomen: Soft/ND/NT, positive BS  Musculoskeletal: No Edema  Neuro: aaox3  Data Reviewed: Basic Metabolic Panel:  Recent Labs Lab 11/11/16 1132 11/12/16 0615  NA 136 135  K 4.1 4.4  CL 94* 95*  CO2 31 31  GLUCOSE 154* 189*  BUN 37* 46*  CREATININE 0.90 0.99  CALCIUM 9.1 8.7*   Liver Function  Tests:  Recent Labs Lab 11/11/16 1132 11/12/16 0615  AST 337* 177*  ALT 438* 316*  ALKPHOS 123 157*  BILITOT 1.1 0.9  PROT 7.5 6.5  ALBUMIN 3.6 3.0*   No results for input(s): LIPASE, AMYLASE in the last 168 hours. No results for input(s): AMMONIA in the last 168 hours. CBC:  Recent Labs Lab 11/11/16 1132 11/12/16 0615  WBC 10.2 12.1*  NEUTROABS 8.5*  --   HGB 11.5* 10.9*  HCT 34.9* 33.6*  MCV 82.9 85.5  PLT 185 171   Cardiac Enzymes:    Recent Labs Lab 11/11/16 1132 11/11/16 1732 11/11/16 2319 11/12/16 0615  TROPONINI 0.03* 0.04* 0.04* 0.03*   BNP (last 3 results)  Recent Labs  03/20/16 0500 11/11/16 1132  BNP 1,657.4* 1,331.2*    ProBNP (last 3 results) No results for input(s): PROBNP in the last 8760 hours.  CBG:  Recent Labs Lab 11/11/16 1734 11/11/16 2151 11/12/16 0745 11/12/16 1147  GLUCAP 145* 264* 181* 218*    Recent Results (from the past 240 hour(s))  MRSA PCR Screening     Status: None   Collection Time: 11/12/16  8:28 AM  Result Value Ref Range Status   MRSA by PCR NEGATIVE NEGATIVE Final    Comment:        The GeneXpert MRSA Assay (FDA approved for NASAL specimens only), is one component of a comprehensive MRSA colonization surveillance program. It is not intended to diagnose MRSA infection nor to guide or monitor treatment for MRSA infections.   Culture, expectorated sputum-assessment     Status: None   Collection Time: 11/12/16  8:37 AM  Result Value Ref Range Status   Specimen Description SPUTUM  Final   Special Requests NONE  Final   Sputum evaluation THIS SPECIMEN IS ACCEPTABLE FOR SPUTUM CULTURE  Final   Report Status 11/12/2016 FINAL  Final     Studies: US Abdomen Limited Ruq  Result Date: 11/11/2016 CLINICAL DATA:  Elevated liver enzymes. Status post cholecystectomy. EXAM: US ABDOMEN LIMITED - RIGHT UPPER QUADRANT COMPARISON:  None. FINDINGS: Gallbladder: Surgically absent. Common bile duct: Diameter: Within  normal limits at 7 mm. Liver: No focal lesion identified. Within normal limits in parenchymal echogenicity. Peripheral contours are slightly nodular/irregular raising the possibility of cirrhosis. Pleural effusion noted at the right lung base. IMPRESSION: 1. Status post cholecystectomy. 2. Common bile duct within normal limits in caliber. 3. Peripheral liver contours are slightly nodular/irregular raising the possibility of cirrhosis. No focal mass or lesion identified within the liver. 4. Right pleural effusion. Electronically Signed   By: Franki Cabot M.D.   On: 11/11/2016 15:38    Scheduled Meds: . azithromycin  500 mg Oral Daily  . busPIRone  5 mg Oral BID  . carvedilol  6.25 mg Oral BID  . cefTRIAXone (ROCEPHIN)  IV  1 g Intravenous Q24H  . clopidogrel  75 mg Oral Daily  . enoxaparin (LOVENOX) injection  40  mg Subcutaneous Q24H  . fluticasone  1 spray Each Nare Daily  . furosemide  40 mg Intravenous Q12H  . insulin aspart  0-9 Units Subcutaneous TID WC  . ipratropium-albuterol  3 mL Nebulization BID  . multivitamin with minerals  1 tablet Oral Q1200  . pantoprazole  40 mg Oral Daily  . ramipril  5 mg Oral Daily  . sodium chloride flush  3 mL Intravenous Q12H    Continuous Infusions:   Time spent: 8mins  Fariha Goto MD, PhD  [facility] Pager [phone]. If 7PM-7AM, please contact night-coverage at [email or web address], password [facility] 11/12/2016, 12:24 PM  LOS: 0 days

## 2016-11-12 NOTE — Evaluation (Signed)
Physical Therapy Evaluation Patient Details Name: Helen [name] MRN: [id number] DOB: October 30, 1929 Today's Date: 11/12/2016   History of Present Illness  83 y.o. female with medical history significant of diabetes, hypertension, hyperlipidemia, coronary artery disease, chronic combined systolic and diastolic CHF, presents to the emergency room with chief complaint of progressive shortness of breath over the last 3-4 days. Pt's son recently died, she missed some doses of Lasix. She reports she uses 2-3L of home O2. Dx of respiratory failure, fluid overload, ? PNA.   Clinical Impression  Pt admitted with above diagnosis. Pt currently with functional limitations due to the deficits listed below (see PT Problem List). Min/guard assist for stand pivot transfer to recliner, pt declined ambulation 2* fatigue. SaO2 84% on RA at rest, she uses 2-3L O2 with activity at baseline. She expects she will be able to return to her ILF where she ambulates independently with a rollator.  Pt will benefit from skilled PT to increase their independence and safety with mobility to allow discharge to the venue listed below.       Follow Up Recommendations Home health PT    Equipment Recommendations  None recommended by PT    Recommendations for Other Services       Precautions / Restrictions Precautions Precautions: Fall Precaution Comments: monitor O2, pt denies h/o falls Restrictions Weight Bearing Restrictions: No      Mobility  Bed Mobility Overal bed mobility: Modified Independent             General bed mobility comments: HOB up 20*, with rail, increased time  Transfers Overall transfer level: Needs assistance Equipment used: Rolling walker (2 wheeled) Transfers: Sit to/from [facility] Sit to Stand: Min guard Stand pivot transfers: Min guard       General transfer comment: min/guard for safety, no physical assist, SaO2 84% on RA at rest, applied O2, pt declined  ambulation 2* fatigue  Ambulation/Gait                Stairs            Wheelchair Mobility    Modified Rankin (Stroke Patients Only)       Balance Overall balance assessment: Modified Independent                                           Pertinent Vitals/Pain Pain Assessment: No/denies pain    Home Living Family/patient expects to be discharged to:: Private residence Living Arrangements: Alone Available Help at Discharge: Family;Available PRN/intermittently Type of Home: Independent living facility Home Access: Level entry     Home Layout: One level Home Equipment: Clinical cytogeneticist - 4 wheels;Other (comment) (home O2, 2-3L)      Prior Function Level of Independence: Independent with assistive device(s)         Comments: walks to dining room with rollator     Hand Dominance        Extremity/Trunk Assessment   Upper Extremity Assessment Upper Extremity Assessment: Overall WFL for tasks assessed    Lower Extremity Assessment Lower Extremity Assessment: Overall WFL for tasks assessed    Cervical / Trunk Assessment Cervical / Trunk Assessment: Normal  Communication   Communication: Expressive difficulties (low volume)  Cognition Arousal/Alertness: Awake/alert Behavior During Therapy: WFL for tasks assessed/performed Overall Cognitive Status: Within Functional Limits for tasks assessed  General Comments      Exercises     Assessment/Plan    PT Assessment Patient needs continued PT services  PT Problem List Decreased activity tolerance;Decreased mobility;Cardiopulmonary status limiting activity       PT Treatment Interventions Gait training;Functional mobility training;Therapeutic exercise;Therapeutic activities;Patient/family education    PT Goals (Current goals can be found in the Care Plan section)  Acute Rehab PT Goals Patient Stated Goal: return to ILF PT Goal Formulation:  With patient Time For Goal Achievement: 11/26/16 Potential to Achieve Goals: Good    Frequency Min 3X/week   Barriers to discharge        Co-evaluation               End of Session Equipment Utilized During Treatment: Gait belt;Oxygen Activity Tolerance: Patient tolerated treatment well Patient left: in chair;with call bell/phone within reach Nurse Communication: Mobility status PT Visit Diagnosis: Other abnormalities of gait and mobility (R26.89)    Functional Assessment Tool Used: AM-PAC 6 Clicks Basic Mobility Functional Limitation: Mobility: Walking and moving around Mobility: Walking and Moving Around Current Status (B5102): At least 20 percent but less than 40 percent impaired, limited or restricted Mobility: Walking and Moving Around Goal Status [phone]): At least 1 percent but less than 20 percent impaired, limited or restricted    Time: 1027-1043 PT Time Calculation (min) (ACUTE ONLY): 16 min   Charges:   PT Evaluation $PT Eval Low Complexity: 1 Procedure     PT G Codes:   PT G-Codes **NOT FOR INPATIENT CLASS** Functional Assessment Tool Used: AM-PAC 6 Clicks Basic Mobility Functional Limitation: Mobility: Walking and moving around Mobility: Walking and Moving Around Current Status (P8242): At least 20 percent but less than 40 percent impaired, limited or restricted Mobility: Walking and Moving Around Goal Status [phone]): At least 1 percent but less than 20 percent impaired, limited or restricted     Philomena Doheny 11/12/2016, 11:02 AM [phone]

## 2016-11-13 ENCOUNTER — Ambulatory Visit: Payer: Medicare Other | Admitting: Cardiovascular Disease

## 2016-11-13 DIAGNOSIS — I5023 Acute on chronic systolic (congestive) heart failure: Secondary | ICD-10-CM

## 2016-11-13 LAB — CBC WITH DIFFERENTIAL/PLATELET
Basophils Absolute: 0 10*3/uL (ref 0.0–0.1)
Basophils Relative: 0 %
Eosinophils Absolute: 0 10*3/uL (ref 0.0–0.7)
Eosinophils Relative: 0 %
HCT: 35.8 % — ABNORMAL LOW (ref 36.0–46.0)
Hemoglobin: 11.4 g/dL — ABNORMAL LOW (ref 12.0–15.0)
Lymphocytes Relative: 6 %
Lymphs Abs: 0.8 10*3/uL (ref 0.7–4.0)
MCH: 27.3 pg (ref 26.0–34.0)
MCHC: 31.8 g/dL (ref 30.0–36.0)
MCV: 85.6 fL (ref 78.0–100.0)
Monocytes Absolute: 1.8 10*3/uL — ABNORMAL HIGH (ref 0.1–1.0)
Monocytes Relative: 12 %
Neutro Abs: 11.8 10*3/uL — ABNORMAL HIGH (ref 1.7–7.7)
Neutrophils Relative %: 82 %
Platelets: 164 10*3/uL (ref 150–400)
RBC: 4.18 MIL/uL (ref 3.87–5.11)
RDW: 16.4 % — ABNORMAL HIGH (ref 11.5–15.5)
WBC: 14.4 10*3/uL — ABNORMAL HIGH (ref 4.0–10.5)

## 2016-11-13 LAB — COMPREHENSIVE METABOLIC PANEL
ALT: 287 U/L — ABNORMAL HIGH (ref 14–54)
AST: 143 U/L — ABNORMAL HIGH (ref 15–41)
Albumin: 3.1 g/dL — ABNORMAL LOW (ref 3.5–5.0)
Alkaline Phosphatase: 186 U/L — ABNORMAL HIGH (ref 38–126)
Anion gap: 12 (ref 5–15)
BUN: 56 mg/dL — ABNORMAL HIGH (ref 6–20)
CO2: 29 mmol/L (ref 22–32)
Calcium: 8.9 mg/dL (ref 8.9–10.3)
Chloride: 93 mmol/L — ABNORMAL LOW (ref 101–111)
Creatinine, Ser: 1.3 mg/dL — ABNORMAL HIGH (ref 0.44–1.00)
GFR calc Af Amer: 42 mL/min — ABNORMAL LOW (ref >60)
GFR calc non Af Amer: 36 mL/min — ABNORMAL LOW (ref >60)
Glucose, Bld: 144 mg/dL — ABNORMAL HIGH (ref 65–99)
Potassium: 4.2 mmol/L (ref 3.5–5.1)
Sodium: 134 mmol/L — ABNORMAL LOW (ref 135–145)
Total Bilirubin: 1 mg/dL (ref 0.3–1.2)
Total Protein: 6.7 g/dL (ref 6.5–8.1)

## 2016-11-13 LAB — GLUCOSE, CAPILLARY
Glucose-Capillary: 139 mg/dL — ABNORMAL HIGH (ref 65–99)
Glucose-Capillary: 222 mg/dL — ABNORMAL HIGH (ref 65–99)
Glucose-Capillary: 186 mg/dL — ABNORMAL HIGH (ref 65–99)
Glucose-Capillary: 151 mg/dL — ABNORMAL HIGH (ref 65–99)

## 2016-11-13 LAB — CULTURE, RESPIRATORY W GRAM STAIN

## 2016-11-13 LAB — HEPATITIS PANEL, ACUTE
HCV Ab: <0.1 s/co ratio (ref 0.0–0.9)
Hep A IgM: NEGATIVE
Hep B C IgM: NEGATIVE
Hepatitis B Surface Ag: NEGATIVE

## 2016-11-13 MED ORDER — FUROSEMIDE 10 MG/ML IJ SOLN
40.0000 mg | Freq: Every day | INTRAMUSCULAR | Status: DC
  Filled 2016-11-13: qty 4

## 2016-11-13 MED ORDER — GUAIFENESIN ER 600 MG PO TB12
600.0000 mg | ORAL_TABLET | Freq: Two times a day (BID) | ORAL | Status: DC
  Administered 2016-11-13 – 2016-11-17 (×9): 600 mg via ORAL
  Filled 2016-11-13 (×9): qty 1

## 2016-11-13 MED ORDER — ENOXAPARIN SODIUM 30 MG/0.3ML ~~LOC~~ SOLN
30.0000 mg | SUBCUTANEOUS | Status: DC
  Administered 2016-11-13 – 2016-11-14 (×2): 30 mg via SUBCUTANEOUS
  Filled 2016-11-13 (×3): qty 0.3

## 2016-11-13 NOTE — Progress Notes (Addendum)
PROGRESS NOTE  Helen [name] WIO:[id number] DOB: 05-22-1930 DOA: 11/11/2016 PCP: Anthoney Harada, MD  HPI/Recap of past 24 hours:  Productive cough, hoarse, on 3liter oxygen ( report baseline 2-3 liter) Edema has improved, denies chest pain, no fever She ambulated with physical therapy this am, Daughter at bedside  Assessment/Plan: Active Problems:   Hypercholesteremia   Diabetes mellitus without complication ([place])   CAD (coronary artery disease)   Essential hypertension   Acute respiratory failure with hypoxia (HCC)   Shortness of breath   Chronic respiratory failure with hypoxia (HCC)   SOB (shortness of breath)  Acute on chronic hypoxic respiratory failure from acute on chronic combined chf =/- superimposed pna -Her baseline oxygen at home is in between 1-3 L depending on activity level -Patient has some evidence of fluid overload however not that impressive on physical exam. Her BNP is elevated, and she missed her Lasix a couple of days while traveling to [place], she is started on iv lasix 40mg  bid since admission, she seems improving on this ,cr increased on 3/23, decrease lasix from bid to daily -Chest x-ray raises the possibility of superimposed pneumonia, cannot completely rule that out given subjective fever and chills as well as a productive cough. She was started on doxycycline as an outpatient did not show any improvement, will change to rocephin/zithro, sputum culture pending, respiratory viral panel negative, mrsa screen negative, urine strep pneuom antigen negative  AKI; cr increase from 0.99 to 1.3 likely from diuresis Decrease lasix dose Repeat bmp in am  Elevated liver enzymes -Patient without history of the same, she has nausea however that is chronic. She reports occasional right upper quadrant tenderness again that's been chronic.  -hepatitis panel pending,  right upper quadrant ultrasound showed patient is s/p cholecystectomy, some concerns of  cirrhosis, right pleural effusion -. Hold statin for now.  -elevated liver enzymes possibly due to liver congestion in the setting of heart failure  Acute on chronic combined systolic and diastolic CHF -Diuresis as above, strict I's and O's, daily weights  Pneumonia -Started rocephin/zitrho  AKI, cr increase from 0.99 to 1.3, likely from lasix, will decrease lasix from bid to daily, repeat bmp in am,  Diabetes mellitus -She is not on any medication, suspect diet controlled, most recent hemoglobin A1c was 6.8 -Sliding-scale insulin  Coronary artery disease -Troponin slightly elevated to 0.03, not in a pattern consistent with ACS and likely due to demand, however we'll cycle.  -She recently had a stress test on February 2018, and findings were pertinent for depressed ejection fraction about 30%, and a medium defect of moderate severity which is nonreversible.  Hyperlipidemia -Hold statin as above due to elevated LFTs   DVT prophylaxis: Lovenox Code Status: Full code  Family Communication: patient and daughter at bedside  Disposition Plan: pending, return to independent living with home health vs SNF depend on patient 's improvement   Consultants:  none  Procedures:  none  Antibiotics:  Doxycycline from admission to 3/22  Rocephin/zitrhomax from 3/22    Objective: BP (!) 120/43 (BP Location: Left Arm)   Pulse 64   Temp 97.9 F (36.6 C) (Oral)   Resp 20   Ht 5\' 3"  (1.6 m)   Wt 63.8 kg (140 lb 10.5 oz)   SpO2 98%   BMI 24.92 kg/m   Intake/Output Summary (Last 24 hours) at 11/13/16 1118 Last data filed at 11/13/16 0952  Gross per 24 hour  Intake  770 ml  Output              750 ml  Net               20 ml   Filed Weights   11/11/16 1645 11/12/16 0445 11/13/16 0500  Weight: 62.9 kg (138 lb 10.7 oz) 63.1 kg (139 lb 3.2 oz) 63.8 kg (140 lb 10.5 oz)    Exam:   General:  Frail, hoarse, but NAD  Cardiovascular: RRR  Respiratory:  diminished right basis, some anterior crackles  Abdomen: Soft/ND/NT, positive BS  Musculoskeletal: No Edema  Neuro: aaox3  Data Reviewed: Basic Metabolic Panel:  Recent Labs Lab 11/11/16 1132 11/12/16 0615 11/13/16 0543  NA 136 135 134*  K 4.1 4.4 4.2  CL 94* 95* 93*  CO2 31 31 29   GLUCOSE 154* 189* 144*  BUN 37* 46* 56*  CREATININE 0.90 0.99 1.30*  CALCIUM 9.1 8.7* 8.9   Liver Function Tests:  Recent Labs Lab 11/11/16 1132 11/12/16 0615 11/13/16 0543  AST 337* 177* 143*  ALT 438* 316* 287*  ALKPHOS 123 157* 186*  BILITOT 1.1 0.9 1.0  PROT 7.5 6.5 6.7  ALBUMIN 3.6 3.0* 3.1*   No results for input(s): LIPASE, AMYLASE in the last 168 hours. No results for input(s): AMMONIA in the last 168 hours. CBC:  Recent Labs Lab 11/11/16 1132 11/12/16 0615 11/13/16 0543  WBC 10.2 12.1* 14.4*  NEUTROABS 8.5*  --  11.8*  HGB 11.5* 10.9* 11.4*  HCT 34.9* 33.6* 35.8*  MCV 82.9 85.5 85.6  PLT 185 171 164   Cardiac Enzymes:    Recent Labs Lab 11/11/16 1132 11/11/16 1732 11/11/16 2319 11/12/16 0615  TROPONINI 0.03* 0.04* 0.04* 0.03*   BNP (last 3 results)  Recent Labs  03/20/16 0500 11/11/16 1132  BNP 1,657.4* 1,331.2*    ProBNP (last 3 results) No results for input(s): PROBNP in the last 8760 hours.  CBG:  Recent Labs Lab 11/12/16 0745 11/12/16 1147 11/12/16 1617 11/12/16 2152 11/13/16 0805  GLUCAP 181* 218* 156* 132* 139*    Recent Results (from the past 240 hour(s))  MRSA PCR Screening     Status: None   Collection Time: 11/12/16  8:28 AM  Result Value Ref Range Status   MRSA by PCR NEGATIVE NEGATIVE Final    Comment:        The GeneXpert MRSA Assay (FDA approved for NASAL specimens only), is one component of a comprehensive MRSA colonization surveillance program. It is not intended to diagnose MRSA infection nor to guide or monitor treatment for MRSA infections.   Respiratory Panel by PCR     Status: None   Collection Time:  11/12/16  8:28 AM  Result Value Ref Range Status   Adenovirus NOT DETECTED NOT DETECTED Final   Coronavirus 229E NOT DETECTED NOT DETECTED Final   Coronavirus HKU1 NOT DETECTED NOT DETECTED Final   Coronavirus NL63 NOT DETECTED NOT DETECTED Final   Coronavirus OC43 NOT DETECTED NOT DETECTED Final   Metapneumovirus NOT DETECTED NOT DETECTED Final   Rhinovirus / Enterovirus NOT DETECTED NOT DETECTED Final   Influenza A NOT DETECTED NOT DETECTED Final   Influenza B NOT DETECTED NOT DETECTED Final   Parainfluenza Virus 1 NOT DETECTED NOT DETECTED Final   Parainfluenza Virus 2 NOT DETECTED NOT DETECTED Final   Parainfluenza Virus 3 NOT DETECTED NOT DETECTED Final   Parainfluenza Virus 4 NOT DETECTED NOT DETECTED Final   Respiratory Syncytial Virus NOT DETECTED NOT DETECTED  Final   Bordetella pertussis NOT DETECTED NOT DETECTED Final   Chlamydophila pneumoniae NOT DETECTED NOT DETECTED Final   Mycoplasma pneumoniae NOT DETECTED NOT DETECTED Final    Comment: Performed at [facility], [place] [address], [place], [place] [address]  Culture, expectorated sputum-assessment     Status: None   Collection Time: 11/12/16  8:37 AM  Result Value Ref Range Status   Specimen Description SPUTUM  Final   Special Requests NONE  Final   Sputum evaluation THIS SPECIMEN IS ACCEPTABLE FOR SPUTUM CULTURE  Final   Report Status 11/12/2016 FINAL  Final  Culture, respiratory (NON-Expectorated)     Status: None (Preliminary result)   Collection Time: 11/12/16  8:37 AM  Result Value Ref Range Status   Specimen Description SPUTUM  Final   Special Requests NONE Reflexed from [id number]  Final   Gram Stain   Final    MODERATE WBC PRESENT, PREDOMINANTLY PMN RARE GRAM POSITIVE COCCI IN PAIRS ABUNDANT GRAM NEGATIVE COCCI FEW GRAM NEGATIVE RODS    Culture   Final    ABUNDANT UNIDENTIFIED ORGANISM IDENTIFICATION TO FOLLOW Performed at [facility], [place] [address] [place],  [address]    Report Status  PENDING  Incomplete     Studies: No results found.  Scheduled Meds: . azithromycin  500 mg Oral Daily  . busPIRone  5 mg Oral BID  . carvedilol  6.25 mg Oral BID  . cefTRIAXone (ROCEPHIN)  IV  1 g Intravenous Q24H  . clopidogrel  75 mg Oral Daily  . enoxaparin (LOVENOX) injection  30 mg Subcutaneous Q24H  . fluticasone  1 spray Each Nare Daily  . [START ON 11/14/2016] furosemide  40 mg Intravenous Daily  . guaiFENesin  600 mg Oral BID  . insulin aspart  0-9 Units Subcutaneous TID WC  . ipratropium-albuterol  3 mL Nebulization BID  . multivitamin with minerals  1 tablet Oral Q1200  . pantoprazole  40 mg Oral Daily  . ramipril  5 mg Oral Daily  . sodium chloride flush  3 mL Intravenous Q12H    Continuous Infusions:   Time spent: 20mins  Preston Weill MD, PhD  [facility] Pager [phone]. If 7PM-7AM, please contact night-coverage at [email or web address], password [facility] 11/13/2016, 11:18 AM  LOS: 1 day

## 2016-11-13 NOTE — Progress Notes (Signed)
A call to [facility] [phone] was made concerning PT and RN needs. Spoke with [place] who states, Yes, [place] use [facility].  Drucie Ip from [place] [phone], called [place] will not need a HHRN/PT order. However will need written in discharge notes that pt will need follow up RN/PT eval and treat documented, not HH orders.

## 2016-11-13 NOTE — Care Management Note (Signed)
Case Management Note  Patient Details  Name: Helen [name] MRN: [id number] Date of Birth: [birth year]  Subjective/Objective: Pt admitted with cco SOB,                    Action/Plan: Pt plan to discharge back to [facility] with [facility] for RN/PT follow up.    Expected Discharge Date:  11/15/16               Expected Discharge Plan:  Assisted Living / Rest Home  In-House Referral:  Clinical Social Work  Discharge planning Services  CM Consult  Post Acute Care Choice:  NA Choice offered to:  Patient  DME Arranged:  N/A DME Agency:  NA  HH Arranged:  RN, PT [place] Agency:  NA  Status of Service:  In process, will continue to follow  If discussed at Long Length of Stay Meetings, dates discussed:    Additional CommentsPurcell Mouton, RN 11/13/2016, 11:44 AM

## 2016-11-13 NOTE — Progress Notes (Signed)
qPhysical Therapy Treatment Patient Details Name: Helen [name] MRN: [id number] DOB: Nov 07, 1929 Today's Date: 11/13/2016    History of Present Illness 83 y.o. female with medical history significant of diabetes, hypertension, hyperlipidemia, coronary artery disease, chronic combined systolic and diastolic CHF, presents to the emergency room with chief complaint of progressive shortness of breath over the last 3-4 days. Pt's son recently died, she missed some doses of Lasix. She reports she uses 2-3L of home O2. Dx of respiratory failure, fluid overload, possibility of superimposed pneumonia.     PT Comments    Pt assisted to bathroom, washed hands, and then combed hair without any unsteadiness.  Pt able to tolerate hallway ambulation today however did require seated rest break due to fatigue.  Recommend HHPT upon d/c.  Follow Up Recommendations  Home health PT     Equipment Recommendations  None recommended by PT    Recommendations for Other Services       Precautions / Restrictions Precautions Precaution Comments: chronic 2-3L O2    Mobility  Bed Mobility               General bed mobility comments: pt up in recliner on arrival  Transfers Overall transfer level: Needs assistance Equipment used: Rolling walker (2 wheeled) Transfers: Sit to/from Stand Sit to Stand: Min guard         General transfer comment: min/guard for safety, verbal cues for UE placement  Ambulation/Gait Ambulation/Gait assistance: Min guard Ambulation Distance (Feet): 180 Feet Assistive device: Rolling walker (2 wheeled) Gait Pattern/deviations: Step-through pattern;Decreased stride length     General Gait Details: pt ambulated to bathroom and then into hallway, performed 90'x2 with seated rest break due to fatigue.  Pt remained on 3L O2 [place], denies SOB   Stairs            Wheelchair Mobility    Modified Rankin (Stroke Patients Only)       Balance                                            Cognition Arousal/Alertness: Awake/alert Behavior During Therapy: WFL for tasks assessed/performed Overall Cognitive Status: Within Functional Limits for tasks assessed                                        Exercises      General Comments        Pertinent Vitals/Pain Pain Assessment: No/denies pain    Home Living                      Prior Function            PT Goals (current goals can now be found in the care plan section) Progress towards PT goals: Progressing toward goals    Frequency    Min 3X/week      PT Plan Current plan remains appropriate    Co-evaluation             End of Session Equipment Utilized During Treatment: Oxygen Activity Tolerance: Patient tolerated treatment well Patient left: in chair;with call bell/phone within reach;with family/visitor present;with chair alarm set Nurse Communication: Mobility status PT Visit Diagnosis: Other abnormalities of gait and mobility (R26.89)     Time: 5366-4403 PT Time Calculation (  min) (ACUTE ONLY): 19 min  Charges:  $Gait Training: 8-22 mins                    G Codes:       Carmelia Bake, PT, DPT 11/13/2016 Pager: [phone]    York Ram E 11/13/2016, 12:57 PM

## 2016-11-14 DIAGNOSIS — G9341 Metabolic encephalopathy: Secondary | ICD-10-CM

## 2016-11-14 DIAGNOSIS — E119 Type 2 diabetes mellitus without complications: Secondary | ICD-10-CM

## 2016-11-14 DIAGNOSIS — N179 Acute kidney failure, unspecified: Secondary | ICD-10-CM

## 2016-11-14 LAB — GLUCOSE, CAPILLARY
Glucose-Capillary: 152 mg/dL — ABNORMAL HIGH (ref 65–99)
Glucose-Capillary: 236 mg/dL — ABNORMAL HIGH (ref 65–99)
Glucose-Capillary: 176 mg/dL — ABNORMAL HIGH (ref 65–99)
Glucose-Capillary: 134 mg/dL — ABNORMAL HIGH (ref 65–99)

## 2016-11-14 LAB — CBC
HCT: 33.8 % — ABNORMAL LOW (ref 36.0–46.0)
Hemoglobin: 10.9 g/dL — ABNORMAL LOW (ref 12.0–15.0)
MCH: 27.6 pg (ref 26.0–34.0)
MCHC: 32.2 g/dL (ref 30.0–36.0)
MCV: 85.6 fL (ref 78.0–100.0)
Platelets: 172 10*3/uL (ref 150–400)
RBC: 3.95 MIL/uL (ref 3.87–5.11)
RDW: 16.2 % — ABNORMAL HIGH (ref 11.5–15.5)
WBC: 17.9 10*3/uL — ABNORMAL HIGH (ref 4.0–10.5)

## 2016-11-14 LAB — COMPREHENSIVE METABOLIC PANEL
ALT: 233 U/L — ABNORMAL HIGH (ref 14–54)
AST: 89 U/L — ABNORMAL HIGH (ref 15–41)
Albumin: 2.9 g/dL — ABNORMAL LOW (ref 3.5–5.0)
Alkaline Phosphatase: 196 U/L — ABNORMAL HIGH (ref 38–126)
Anion gap: 11 (ref 5–15)
BUN: 57 mg/dL — ABNORMAL HIGH (ref 6–20)
CO2: 33 mmol/L — ABNORMAL HIGH (ref 22–32)
Calcium: 8.6 mg/dL — ABNORMAL LOW (ref 8.9–10.3)
Chloride: 88 mmol/L — ABNORMAL LOW (ref 101–111)
Creatinine, Ser: 1.2 mg/dL — ABNORMAL HIGH (ref 0.44–1.00)
GFR calc Af Amer: 46 mL/min — ABNORMAL LOW (ref >60)
GFR calc non Af Amer: 40 mL/min — ABNORMAL LOW (ref >60)
Glucose, Bld: 160 mg/dL — ABNORMAL HIGH (ref 65–99)
Potassium: 4.1 mmol/L (ref 3.5–5.1)
Sodium: 132 mmol/L — ABNORMAL LOW (ref 135–145)
Total Bilirubin: 0.9 mg/dL (ref 0.3–1.2)
Total Protein: 6.6 g/dL (ref 6.5–8.1)

## 2016-11-14 LAB — LEGIONELLA PNEUMOPHILA SEROGP 1 UR AG: L. pneumophila Serogp 1 Ur Ag: NEGATIVE

## 2016-11-14 LAB — HEMOGLOBIN A1C
Hgb A1c MFr Bld: 7.2 % — ABNORMAL HIGH (ref 4.8–5.6)
Mean Plasma Glucose: 160 mg/dL

## 2016-11-14 LAB — AMMONIA: Ammonia: 32 umol/L (ref 9–35)

## 2016-11-14 NOTE — Progress Notes (Signed)
PROGRESS NOTE  Helen [name] ZOX:[id number] DOB: October 09, 1929 DOA: 11/11/2016 PCP: Anthoney Harada, MD  HPI/Recap of past 24 hours:  Less hoarse, on 3liter oxygen ( report baseline 2-3 liter) Denies chest pain, no fever Daughter at bedside  Assessment/Plan: Active Problems:   Hypercholesteremia   Diabetes mellitus without complication (HCC)   CAD (coronary artery disease)   Essential hypertension   Acute respiratory failure with hypoxia (HCC)   Shortness of breath   Chronic respiratory failure with hypoxia (HCC)   SOB (shortness of breath)  Acute on chronic hypoxic respiratory failure from acute on chronic combined chf /superimposed pna -Her baseline oxygen at home is in between 1-3 L depending on activity level -Patient has some evidence of fluid overload however not that impressive on physical exam. Her BNP is elevated, and she missed her Lasix a couple of days while traveling to [place], she is started on iv lasix 40mg  bid since admission, she seems improving on this ,cr increased on 3/23, decrease lasix from bid to daily -Chest x-ray raises the possibility of superimposed pneumonia, cannot completely rule that out given subjective fever and chills as well as a productive cough. She was started on doxycycline as an outpatient did not show any improvement, abx changed to rocephin/zithro, sputum culture + beta lactamase positive HAEMOPHILUS INFLUENZAE  , respiratory viral panel negative, mrsa screen negative, urine strep pneuom antigen negative  AKI; cr increase from 0.99 to 1.3 likely from diuresis Decrease lasix dose Repeat bmp in am  Elevated liver enzymes -Patient without history of the same, she has nausea however that is chronic. She reports occasional right upper quadrant tenderness again that's been chronic.  -hepatitis panel pending,  right upper quadrant ultrasound showed patient is s/p cholecystectomy, some concerns of cirrhosis, right pleural effusion -. Hold  statin for now.  -elevated liver enzymes possibly due to liver congestion in the setting of heart failure and from infection -seems improving  Acute on chronic combined systolic and diastolic CHF -Diuresis as above, strict I's and O's, daily weights -urine output 1.1 liter last 24hrs, total negative 800cc since admission  Pneumonia -Started rocephin/zitrho  Encephalopathy vs delirium, she has intermittent confusion reported by family and RN on 3/24, when I exam her, she is aaox3 She does has elevated wbc 17.9 today ( 14.4 yesterday), he sodium is 132 today ( 134 yesterday) He cr and lft has improved, ammonia level unremarkable, will add on blood culture, continue monitor I have reviewed Past mri obtained from 11/2015 "Age-related cerebral atrophy with mild chronic small vessel ischemic disease. 3. Remote lacunar infarct within the left basal ganglia." I have discussed with patient and family will repeat imaging is her mental status does not improve   noninsulin dependnet Diabetes mellitus -She is not on any medication, suspect diet controlled,  hemoglobin A1c was 6.8 in 11/2015, repeat a1c 7.2 this hospitalization -Sliding-scale insulin  Coronary artery disease -Troponin slightly elevated to 0.03-0.04-0.03, not in a pattern consistent with ACS and likely due to demand,  -She recently had a stress test on February 2018, and findings were pertinent for depressed ejection fraction about 30%, and a medium defect of moderate severity which is nonreversible. -she has remote exposure to adriamycin for breast cancer, she also has diet controlled diabetes -continue plavix /coreg  Hyperlipidemia -Hold statin as above due to elevated LFTs   DVT prophylaxis while in the hospital: Lovenox Code Status: Full code  Family Communication: patient and daughter at bedside  Disposition Plan: pending, return  to independent living with home health vs SNF depend on patient 's  improvement   Consultants:  none  Procedures:  none  Antibiotics:  Doxycycline from admission to 3/22  Rocephin/zitrhomax from 3/22    Objective: BP (!) 125/92 (BP Location: Right Arm)   Pulse 76   Temp 97.7 F (36.5 C) (Oral)   Resp 16   Ht 5\' 3"  (1.6 m)   Wt 64.3 kg (141 lb 12.1 oz)   SpO2 99%   BMI 25.11 kg/m   Intake/Output Summary (Last 24 hours) at 11/14/16 1136 Last data filed at 11/14/16 0600  Gross per 24 hour  Intake              240 ml  Output              950 ml  Net             -710 ml   Filed Weights   11/12/16 0445 11/13/16 0500 11/14/16 0659  Weight: 63.1 kg (139 lb 3.2 oz) 63.8 kg (140 lb 10.5 oz) 64.3 kg (141 lb 12.1 oz)    Exam:   General:  Frail, less hoarse, but NAD, no confusion during exam  Cardiovascular: RRR  Respiratory: diminished right basis, some anterior crackles  Abdomen: Soft/ND/NT, positive BS  Musculoskeletal: No Edema  Neuro: aaox3  Data Reviewed: Basic Metabolic Panel:  Recent Labs Lab 11/11/16 1132 11/12/16 0615 11/13/16 0543 11/14/16 0503  NA 136 135 134* 132*  K 4.1 4.4 4.2 4.1  CL 94* 95* 93* 88*  CO2 31 31 29  33*  GLUCOSE 154* 189* 144* 160*  BUN 37* 46* 56* 57*  CREATININE 0.90 0.99 1.30* 1.20*  CALCIUM 9.1 8.7* 8.9 8.6*   Liver Function Tests:  Recent Labs Lab 11/11/16 1132 11/12/16 0615 11/13/16 0543 11/14/16 0503  AST 337* 177* 143* 89*  ALT 438* 316* 287* 233*  ALKPHOS 123 157* 186* 196*  BILITOT 1.1 0.9 1.0 0.9  PROT 7.5 6.5 6.7 6.6  ALBUMIN 3.6 3.0* 3.1* 2.9*   No results for input(s): LIPASE, AMYLASE in the last 168 hours.  Recent Labs Lab 11/14/16 1029  AMMONIA 32   CBC:  Recent Labs Lab 11/11/16 1132 11/12/16 0615 11/13/16 0543 11/14/16 0503  WBC 10.2 12.1* 14.4* 17.9*  NEUTROABS 8.5*  --  11.8*  --   HGB 11.5* 10.9* 11.4* 10.9*  HCT 34.9* 33.6* 35.8* 33.8*  MCV 82.9 85.5 85.6 85.6  PLT 185 171 164 172   Cardiac Enzymes:    Recent Labs Lab  11/11/16 1132 11/11/16 1732 11/11/16 2319 11/12/16 0615  TROPONINI 0.03* 0.04* 0.04* 0.03*   BNP (last 3 results)  Recent Labs  03/20/16 0500 11/11/16 1132  BNP 1,657.4* 1,331.2*    ProBNP (last 3 results) No results for input(s): PROBNP in the last 8760 hours.  CBG:  Recent Labs Lab 11/13/16 1138 11/13/16 1636 11/13/16 2052 11/14/16 0729 11/14/16 1131  GLUCAP 222* 186* 151* 152* 236*    Recent Results (from the past 240 hour(s))  MRSA PCR Screening     Status: None   Collection Time: 11/12/16  8:28 AM  Result Value Ref Range Status   MRSA by PCR NEGATIVE NEGATIVE Final    Comment:        The GeneXpert MRSA Assay (FDA approved for NASAL specimens only), is one component of a comprehensive MRSA colonization surveillance program. It is not intended to diagnose MRSA infection nor to guide or monitor treatment for MRSA infections.  Respiratory Panel by PCR     Status: None   Collection Time: 11/12/16  8:28 AM  Result Value Ref Range Status   Adenovirus NOT DETECTED NOT DETECTED Final   Coronavirus 229E NOT DETECTED NOT DETECTED Final   Coronavirus HKU1 NOT DETECTED NOT DETECTED Final   Coronavirus NL63 NOT DETECTED NOT DETECTED Final   Coronavirus OC43 NOT DETECTED NOT DETECTED Final   Metapneumovirus NOT DETECTED NOT DETECTED Final   Rhinovirus / Enterovirus NOT DETECTED NOT DETECTED Final   Influenza A NOT DETECTED NOT DETECTED Final   Influenza B NOT DETECTED NOT DETECTED Final   Parainfluenza Virus 1 NOT DETECTED NOT DETECTED Final   Parainfluenza Virus 2 NOT DETECTED NOT DETECTED Final   Parainfluenza Virus 3 NOT DETECTED NOT DETECTED Final   Parainfluenza Virus 4 NOT DETECTED NOT DETECTED Final   Respiratory Syncytial Virus NOT DETECTED NOT DETECTED Final   Bordetella pertussis NOT DETECTED NOT DETECTED Final   Chlamydophila pneumoniae NOT DETECTED NOT DETECTED Final   Mycoplasma pneumoniae NOT DETECTED NOT DETECTED Final    Comment: Performed at  [facility], [place] [address], [place], [place] [address]  Culture, expectorated sputum-assessment     Status: None   Collection Time: 11/12/16  8:37 AM  Result Value Ref Range Status   Specimen Description SPUTUM  Final   Special Requests NONE  Final   Sputum evaluation THIS SPECIMEN IS ACCEPTABLE FOR SPUTUM CULTURE  Final   Report Status 11/12/2016 FINAL  Final  Culture, respiratory (NON-Expectorated)     Status: None   Collection Time: 11/12/16  8:37 AM  Result Value Ref Range Status   Specimen Description SPUTUM  Final   Special Requests NONE Reflexed from H2107  Final   Gram Stain   Final    MODERATE WBC PRESENT, PREDOMINANTLY PMN RARE GRAM POSITIVE COCCI IN PAIRS ABUNDANT GRAM NEGATIVE COCCI FEW GRAM NEGATIVE RODS    Culture   Final    ABUNDANT HAEMOPHILUS INFLUENZAE BETA LACTAMASE POSITIVE Performed at [facility], [place] [address] [place],  [address]    Report Status 11/13/2016 FINAL  Final     Studies: No results found.  Scheduled Meds: . azithromycin  500 mg Oral Daily  . busPIRone  5 mg Oral BID  . carvedilol  6.25 mg Oral BID  . cefTRIAXone (ROCEPHIN)  IV  1 g Intravenous Q24H  . clopidogrel  75 mg Oral Daily  . enoxaparin (LOVENOX) injection  30 mg Subcutaneous Q24H  . fluticasone  1 spray Each Nare Daily  . guaiFENesin  600 mg Oral BID  . insulin aspart  0-9 Units Subcutaneous TID WC  . ipratropium-albuterol  3 mL Nebulization BID  . multivitamin with minerals  1 tablet Oral Q1200  . pantoprazole  40 mg Oral Daily  . sodium chloride flush  3 mL Intravenous Q12H    Continuous Infusions:   Time spent: 30mins  Tiffinie Caillier MD, PhD  [facility] Pager [phone]. If 7PM-7AM, please contact night-coverage at [email or web address], password [employer] 11/14/2016, 11:36 AM  LOS: 2 days

## 2016-11-14 NOTE — Progress Notes (Signed)
Pt has been intermittent confused throughout the day unlike previous days. Pt up OOB and does not know why, is forgetful and impulsive in her actions. Discussed with daughter and MD. Will cont to monitor pt and maintain her safety. SRP, RN

## 2016-11-15 DIAGNOSIS — G9340 Encephalopathy, unspecified: Secondary | ICD-10-CM

## 2016-11-15 LAB — AMMONIA: Ammonia: 39 umol/L — ABNORMAL HIGH (ref 9–35)

## 2016-11-15 LAB — CBC WITH DIFFERENTIAL/PLATELET
Basophils Absolute: 0 10*3/uL (ref 0.0–0.1)
Basophils Relative: 0 %
Eosinophils Absolute: 0.1 10*3/uL (ref 0.0–0.7)
Eosinophils Relative: 1 %
HCT: 33.4 % — ABNORMAL LOW (ref 36.0–46.0)
Hemoglobin: 10.6 g/dL — ABNORMAL LOW (ref 12.0–15.0)
Lymphocytes Relative: 5 %
Lymphs Abs: 0.7 10*3/uL (ref 0.7–4.0)
MCH: 27.5 pg (ref 26.0–34.0)
MCHC: 31.7 g/dL (ref 30.0–36.0)
MCV: 86.5 fL (ref 78.0–100.0)
Monocytes Absolute: 1.7 10*3/uL — ABNORMAL HIGH (ref 0.1–1.0)
Monocytes Relative: 11 %
Neutro Abs: 13.3 10*3/uL — ABNORMAL HIGH (ref 1.7–7.7)
Neutrophils Relative %: 83 %
Platelets: 163 10*3/uL (ref 150–400)
RBC: 3.86 MIL/uL — ABNORMAL LOW (ref 3.87–5.11)
RDW: 16.8 % — ABNORMAL HIGH (ref 11.5–15.5)
WBC: 15.9 10*3/uL — ABNORMAL HIGH (ref 4.0–10.5)

## 2016-11-15 LAB — COMPREHENSIVE METABOLIC PANEL
ALT: 167 U/L — ABNORMAL HIGH (ref 14–54)
AST: 45 U/L — ABNORMAL HIGH (ref 15–41)
Albumin: 2.8 g/dL — ABNORMAL LOW (ref 3.5–5.0)
Alkaline Phosphatase: 164 U/L — ABNORMAL HIGH (ref 38–126)
Anion gap: 11 (ref 5–15)
BUN: 51 mg/dL — ABNORMAL HIGH (ref 6–20)
CO2: 30 mmol/L (ref 22–32)
Calcium: 8.7 mg/dL — ABNORMAL LOW (ref 8.9–10.3)
Chloride: 93 mmol/L — ABNORMAL LOW (ref 101–111)
Creatinine, Ser: 1.07 mg/dL — ABNORMAL HIGH (ref 0.44–1.00)
GFR calc Af Amer: 53 mL/min — ABNORMAL LOW (ref >60)
GFR calc non Af Amer: 46 mL/min — ABNORMAL LOW (ref >60)
Glucose, Bld: 140 mg/dL — ABNORMAL HIGH (ref 65–99)
Potassium: 4.5 mmol/L (ref 3.5–5.1)
Sodium: 134 mmol/L — ABNORMAL LOW (ref 135–145)
Total Bilirubin: 0.8 mg/dL (ref 0.3–1.2)
Total Protein: 6.3 g/dL — ABNORMAL LOW (ref 6.5–8.1)

## 2016-11-15 LAB — LACTIC ACID, PLASMA: Lactic Acid, Venous: 1 mmol/L (ref 0.5–1.9)

## 2016-11-15 LAB — GLUCOSE, CAPILLARY
Glucose-Capillary: 128 mg/dL — ABNORMAL HIGH (ref 65–99)
Glucose-Capillary: 226 mg/dL — ABNORMAL HIGH (ref 65–99)
Glucose-Capillary: 126 mg/dL — ABNORMAL HIGH (ref 65–99)

## 2016-11-15 MED ORDER — CEFPODOXIME PROXETIL 200 MG PO TABS
200.0000 mg | ORAL_TABLET | Freq: Two times a day (BID) | ORAL | Status: DC
  Administered 2016-11-15 – 2016-11-17 (×5): 200 mg via ORAL
  Filled 2016-11-15 (×6): qty 1

## 2016-11-15 NOTE — Progress Notes (Signed)
PROGRESS NOTE  Helen [name] GBT:[id number] DOB: [birth year] DOA: 11/11/2016 PCP: Anthoney Harada, MD  HPI/Recap of past 24 hours:  Improving, Less hoarse, less cough, no edema, no fever, no chest pain,  on 3liter oxygen ( report baseline 2-3 liter) Still slight confusion Three Daughters at bedside  Assessment/Plan: Active Problems:   Hypercholesteremia   Diabetes mellitus without complication (HCC)   CAD (coronary artery disease)   Essential hypertension   Acute respiratory failure with hypoxia (HCC)   Shortness of breath   Chronic respiratory failure with hypoxia (HCC)   SOB (shortness of breath)  Acute on chronic hypoxic respiratory failure from acute on chronic combined chf /superimposed pna -Her baseline oxygen at home is in between 1-3 L depending on activity level -slight fluid overload on physical exam. Her BNP is elevated, and she missed her Lasix a couple of days while traveling to [place], she is started on iv lasix 40mg  bid since admission, she seems improving on this ,cr increased on 3/23, decrease lasix from bid to daily since 3/23 -Chest x-ray raises the possibility of superimposed pneumonia,she has  subjective fever and chills as well as a productive cough. She was started on doxycycline as an outpatient did not show any improvement, abx changed to rocephin/zithro, sputum culture + beta lactamase positive HAEMOPHILUS INFLUENZAE  , respiratory viral panel negative, mrsa screen negative, urine strep pneuom antigen negative abx changed to oral 3rd generation cephalosporin on 3/25 with clinical improvement.  AKI; cr increase from 0.99 to 1.3 likely from diuresis, UA does has protein, but no infection. Decrease lasix dose Cr 1.07 on 3/25  Elevated liver enzymes -Patient without history of the same, she has nausea however that is chronic ( possible from heart failure gi congestion) . She reports occasional right upper quadrant tenderness again that's been chronic.   -hepatitis panel negative,  right upper quadrant ultrasound showed patient is s/p cholecystectomy, some concerns of cirrhosis, small right pleural effusion( which is known and chronic, presumed from prior radiation+/-heart failure) -. Hold statin for now.  -elevated liver enzymes possibly due to liver congestion in the setting of heart failure and from presumed pneumonia - improving  Acute on chronic combined systolic and diastolic CHF -she has remote exposure to adriamycin for breast cancer, she also has diet controlled diabetes -Diuresis as above, strict I's and O's, daily weights - negative fluids balance, lower extremity edema has improved  Coronary artery disease -Troponin slightly elevated to 0.03-0.04-0.03, not in a pattern consistent with ACS and likely due to demand,  -She recently had a stress test on February 2018, and findings were pertinent for depressed ejection fraction about 30%, and a medium defect of moderate severity which is nonreversible. -denies chest pain -continue plavix /coreg  Pneumonia -Started rocephin/zitrho -sputum culture " ABUNDANT HAEMOPHILUS INFLUENZAE, BETA LACTAMASE POSITIVE" Blood culture no growth -clinically improving, change abx to vantin  Encephalopathy vs delirium, she has intermittent confusion reported by family and RN on 3/24, when I exam her, she is aaox3 She does has elevated wbc 17.9 today ( 14.4 yesterday), he sodium is 132 today ( 134 yesterday) He cr and lft has improved, ammonia level unremarkable, will add on blood culture, continue monitor I have reviewed Past mri obtained from 11/2015 "Age-related cerebral atrophy with mild chronic small vessel ischemic disease. 3. Remote lacunar infarct within the left basal ganglia." I have discussed with patient and family will repeat imaging is her mental status does not improve   noninsulin dependnet Diabetes mellitus -  She is not on any medication, suspect diet controlled,  hemoglobin A1c  was 6.8 in 11/2015, repeat a1c 7.2 this hospitalization -Sliding-scale insulin -diet modification -I have discussed with family about starting metformin, they prefer to try diet modification first, diabetic diet education requested   Hyperlipidemia -Hold statin as above due to elevated LFTs -check lipid panel, may need to resume lipitor at a lower dose at discharge    DVT prophylaxis while in the hospital: Lovenox Code Status: Full code  Family Communication: patient and three daughters at bedside  Disposition Plan: pending, return to independent living with home health vs SNF depend on patient 's improvement   Consultants:  none  Procedures:  none  Antibiotics:  Doxycycline from admission to 3/22  Rocephin/zitrhomax from 3/22 to 3/25  vantin from 3/25    Objective: BP 104/72 (BP Location: Left Arm)   Pulse 69   Temp 98.1 F (36.7 C) (Oral)   Resp 18   Ht 5\' 3"  (1.6 m)   Wt 63.7 kg (140 lb 6.9 oz)   SpO2 97%   BMI 24.88 kg/m   Intake/Output Summary (Last 24 hours) at 11/15/16 1752 Last data filed at 11/15/16 1642  Gross per 24 hour  Intake              350 ml  Output              200 ml  Net              150 ml   Filed Weights   11/13/16 0500 11/14/16 0659 11/15/16 0523  Weight: 63.8 kg (140 lb 10.5 oz) 64.3 kg (141 lb 12.1 oz) 63.7 kg (140 lb 6.9 oz)    Exam:   General:  Frail, less hoarse, but NAD, slight confusion but able to self correct, she does report has been having memory issues for a few months  Cardiovascular: RRR  Respiratory: diminished right basis, some anterior crackles  Abdomen: Soft/ND/NT, positive BS  Musculoskeletal: No Edema  Neuro: slight confusion but able to self correct,   Data Reviewed: Basic Metabolic Panel:  Recent Labs Lab 11/11/16 1132 11/12/16 0615 11/13/16 0543 11/14/16 0503 11/15/16 0433  NA 136 135 134* 132* 134*  K 4.1 4.4 4.2 4.1 4.5  CL 94* 95* 93* 88* 93*  CO2 31 31 29  33* 30  GLUCOSE  154* 189* 144* 160* 140*  BUN 37* 46* 56* 57* 51*  CREATININE 0.90 0.99 1.30* 1.20* 1.07*  CALCIUM 9.1 8.7* 8.9 8.6* 8.7*   Liver Function Tests:  Recent Labs Lab 11/11/16 1132 11/12/16 0615 11/13/16 0543 11/14/16 0503 11/15/16 0433  AST 337* 177* 143* 89* 45*  ALT 438* 316* 287* 233* 167*  ALKPHOS 123 157* 186* 196* 164*  BILITOT 1.1 0.9 1.0 0.9 0.8  PROT 7.5 6.5 6.7 6.6 6.3*  ALBUMIN 3.6 3.0* 3.1* 2.9* 2.8*   No results for input(s): LIPASE, AMYLASE in the last 168 hours.  Recent Labs Lab 11/14/16 1029 11/15/16 0433  AMMONIA 32 39*   CBC:  Recent Labs Lab 11/11/16 1132 11/12/16 0615 11/13/16 0543 11/14/16 0503 11/15/16 0433  WBC 10.2 12.1* 14.4* 17.9* 15.9*  NEUTROABS 8.5*  --  11.8*  --  13.3*  HGB 11.5* 10.9* 11.4* 10.9* 10.6*  HCT 34.9* 33.6* 35.8* 33.8* 33.4*  MCV 82.9 85.5 85.6 85.6 86.5  PLT 185 171 164 172 163   Cardiac Enzymes:    Recent Labs Lab 11/11/16 1132 11/11/16 1732 11/11/16 2319 11/12/16 0615  TROPONINI 0.03* 0.04* 0.04* 0.03*   BNP (last 3 results)  Recent Labs  03/20/16 0500 11/11/16 1132  BNP 1,657.4* 1,331.2*    ProBNP (last 3 results) No results for input(s): PROBNP in the last 8760 hours.  CBG:  Recent Labs Lab 11/14/16 1642 11/14/16 2148 11/15/16 0750 11/15/16 1143 11/15/16 1704  GLUCAP 176* 134* 128* 226* 126*    Recent Results (from the past 240 hour(s))  MRSA PCR Screening     Status: None   Collection Time: 11/12/16  8:28 AM  Result Value Ref Range Status   MRSA by PCR NEGATIVE NEGATIVE Final    Comment:        The GeneXpert MRSA Assay (FDA approved for NASAL specimens only), is one component of a comprehensive MRSA colonization surveillance program. It is not intended to diagnose MRSA infection nor to guide or monitor treatment for MRSA infections.   Respiratory Panel by PCR     Status: None   Collection Time: 11/12/16  8:28 AM  Result Value Ref Range Status   Adenovirus NOT DETECTED NOT  DETECTED Final   Coronavirus 229E NOT DETECTED NOT DETECTED Final   Coronavirus HKU1 NOT DETECTED NOT DETECTED Final   Coronavirus NL63 NOT DETECTED NOT DETECTED Final   Coronavirus OC43 NOT DETECTED NOT DETECTED Final   Metapneumovirus NOT DETECTED NOT DETECTED Final   Rhinovirus / Enterovirus NOT DETECTED NOT DETECTED Final   Influenza A NOT DETECTED NOT DETECTED Final   Influenza B NOT DETECTED NOT DETECTED Final   Parainfluenza Virus 1 NOT DETECTED NOT DETECTED Final   Parainfluenza Virus 2 NOT DETECTED NOT DETECTED Final   Parainfluenza Virus 3 NOT DETECTED NOT DETECTED Final   Parainfluenza Virus 4 NOT DETECTED NOT DETECTED Final   Respiratory Syncytial Virus NOT DETECTED NOT DETECTED Final   Bordetella pertussis NOT DETECTED NOT DETECTED Final   Chlamydophila pneumoniae NOT DETECTED NOT DETECTED Final   Mycoplasma pneumoniae NOT DETECTED NOT DETECTED Final    Comment: Performed at [facility], [place] [address], [place], [place] [address]  Culture, expectorated sputum-assessment     Status: None   Collection Time: 11/12/16  8:37 AM  Result Value Ref Range Status   Specimen Description SPUTUM  Final   Special Requests NONE  Final   Sputum evaluation THIS SPECIMEN IS ACCEPTABLE FOR SPUTUM CULTURE  Final   Report Status 11/12/2016 FINAL  Final  Culture, respiratory (NON-Expectorated)     Status: None   Collection Time: 11/12/16  8:37 AM  Result Value Ref Range Status   Specimen Description SPUTUM  Final   Special Requests NONE Reflexed from H2107  Final   Gram Stain   Final    MODERATE WBC PRESENT, PREDOMINANTLY PMN RARE GRAM POSITIVE COCCI IN PAIRS ABUNDANT GRAM NEGATIVE COCCI FEW GRAM NEGATIVE RODS    Culture   Final    ABUNDANT HAEMOPHILUS INFLUENZAE BETA LACTAMASE POSITIVE Performed at [facility], [place] [address] [place], [place] [address]    Report Status 11/13/2016 FINAL  Final  Culture, blood (routine x 2)     Status: None (Preliminary result)    Collection Time: 11/14/16 10:23 AM  Result Value Ref Range Status   Specimen Description BLOOD RIGHT ANTECUBITAL  Final   Special Requests BOTTLES DRAWN AEROBIC AND ANAEROBIC 5CC  Final   Culture   Final    NO GROWTH 1 DAY Performed at [facility], [place] [address], [place], [place] [address]    Report  Status PENDING  Incomplete  Culture, blood (routine x 2)     Status: None (Preliminary result)   Collection Time: 11/14/16 10:29 AM  Result Value Ref Range Status   Specimen Description BLOOD RIGHT HAND  Final   Special Requests IN PEDIATRIC BOTTLE 2CC  Final   Culture   Final    NO GROWTH 1 DAY Performed at [facility], [place] [address] [place], [place] [address]    Report Status PENDING  Incomplete     Studies: No results found.  Scheduled Meds: . busPIRone  5 mg Oral BID  . carvedilol  6.25 mg Oral BID  . cefpodoxime  200 mg Oral Q12H  . clopidogrel  75 mg Oral Daily  . enoxaparin (LOVENOX) injection  30 mg Subcutaneous Q24H  . fluticasone  1 spray Each Nare Daily  . guaiFENesin  600 mg Oral BID  . insulin aspart  0-9 Units Subcutaneous TID WC  . ipratropium-albuterol  3 mL Nebulization BID  . multivitamin with minerals  1 tablet Oral Q1200  . pantoprazole  40 mg Oral Daily  . sodium chloride flush  3 mL Intravenous Q12H    Continuous Infusions:   Time spent: 3mins  Belladonna Lubinski MD, PhD  [facility] Pager [phone]. If 7PM-7AM, please contact night-coverage at [email or web address], password [facility] 11/15/2016, 5:52 PM  LOS: 3 days

## 2016-11-16 ENCOUNTER — Inpatient Hospital Stay (HOSPITAL_COMMUNITY): Payer: Medicare Other

## 2016-11-16 DIAGNOSIS — R410 Disorientation, unspecified: Secondary | ICD-10-CM

## 2016-11-16 LAB — CBC WITH DIFFERENTIAL/PLATELET
Basophils Absolute: 0 10*3/uL (ref 0.0–0.1)
Basophils Relative: 0 %
Eosinophils Absolute: 0.1 10*3/uL (ref 0.0–0.7)
Eosinophils Relative: 1 %
HCT: 36 % (ref 36.0–46.0)
Hemoglobin: 11.2 g/dL — ABNORMAL LOW (ref 12.0–15.0)
Lymphocytes Relative: 5 %
Lymphs Abs: 0.9 10*3/uL (ref 0.7–4.0)
MCH: 26.9 pg (ref 26.0–34.0)
MCHC: 31.1 g/dL (ref 30.0–36.0)
MCV: 86.5 fL (ref 78.0–100.0)
Monocytes Absolute: 1.8 10*3/uL — ABNORMAL HIGH (ref 0.1–1.0)
Monocytes Relative: 10 %
Neutro Abs: 15.4 10*3/uL — ABNORMAL HIGH (ref 1.7–7.7)
Neutrophils Relative %: 84 %
Platelets: 184 10*3/uL (ref 150–400)
RBC: 4.16 MIL/uL (ref 3.87–5.11)
RDW: 16.8 % — ABNORMAL HIGH (ref 11.5–15.5)
WBC: 18.3 10*3/uL — ABNORMAL HIGH (ref 4.0–10.5)

## 2016-11-16 LAB — MAGNESIUM: Magnesium: 2.2 mg/dL (ref 1.7–2.4)

## 2016-11-16 LAB — COMPREHENSIVE METABOLIC PANEL
ALT: 130 U/L — ABNORMAL HIGH (ref 14–54)
AST: 33 U/L (ref 15–41)
Albumin: 3 g/dL — ABNORMAL LOW (ref 3.5–5.0)
Alkaline Phosphatase: 147 U/L — ABNORMAL HIGH (ref 38–126)
Anion gap: 9 (ref 5–15)
BUN: 39 mg/dL — ABNORMAL HIGH (ref 6–20)
CO2: 33 mmol/L — ABNORMAL HIGH (ref 22–32)
Calcium: 8.8 mg/dL — ABNORMAL LOW (ref 8.9–10.3)
Chloride: 95 mmol/L — ABNORMAL LOW (ref 101–111)
Creatinine, Ser: 0.77 mg/dL (ref 0.44–1.00)
GFR calc Af Amer: >60 mL/min (ref >60)
GFR calc non Af Amer: >60 mL/min (ref >60)
Glucose, Bld: 153 mg/dL — ABNORMAL HIGH (ref 65–99)
Potassium: 4.8 mmol/L (ref 3.5–5.1)
Sodium: 137 mmol/L (ref 135–145)
Total Bilirubin: 0.7 mg/dL (ref 0.3–1.2)
Total Protein: 6.8 g/dL (ref 6.5–8.1)

## 2016-11-16 LAB — LIPID PANEL
Cholesterol: 84 mg/dL (ref 0–200)
HDL: 18 mg/dL — ABNORMAL LOW (ref >40)
LDL Cholesterol: 48 mg/dL (ref 0–99)
Total CHOL/HDL Ratio: 4.7 RATIO
Triglycerides: 91 mg/dL (ref <150)
VLDL: 18 mg/dL (ref 0–40)

## 2016-11-16 LAB — GLUCOSE, CAPILLARY
Glucose-Capillary: 184 mg/dL — ABNORMAL HIGH (ref 65–99)
Glucose-Capillary: 207 mg/dL — ABNORMAL HIGH (ref 65–99)
Glucose-Capillary: 179 mg/dL — ABNORMAL HIGH (ref 65–99)
Glucose-Capillary: 140 mg/dL — ABNORMAL HIGH (ref 65–99)

## 2016-11-16 LAB — AMMONIA: Ammonia: 13 umol/L (ref 9–35)

## 2016-11-16 MED ORDER — LISINOPRIL 2.5 MG PO TABS: 2.5000 mg | ORAL_TABLET | Freq: Every day | ORAL | 0 refills | Status: DC

## 2016-11-16 MED ORDER — GUAIFENESIN ER 600 MG PO TB12: 600.0000 mg | ORAL_TABLET | Freq: Two times a day (BID) | ORAL | 0 refills | Status: DC

## 2016-11-16 MED ORDER — POLYETHYLENE GLYCOL 3350 17 G PO PACK
17.0000 g | PACK | Freq: Every day | ORAL | Status: DC
  Administered 2016-11-16: 17 g via ORAL
  Filled 2016-11-16: qty 1

## 2016-11-16 MED ORDER — ENOXAPARIN SODIUM 40 MG/0.4ML ~~LOC~~ SOLN
40.0000 mg | SUBCUTANEOUS | Status: DC
  Administered 2016-11-16: 40 mg via SUBCUTANEOUS
  Filled 2016-11-16: qty 0.4

## 2016-11-16 MED ORDER — SODIUM CHLORIDE 0.9 % IV BOLUS (SEPSIS)
250.0000 mL | Freq: Once | INTRAVENOUS | Status: AC
  Administered 2016-11-16: 250 mL via INTRAVENOUS

## 2016-11-16 MED ORDER — CEFPODOXIME PROXETIL 200 MG PO TABS: 200.0000 mg | ORAL_TABLET | Freq: Two times a day (BID) | ORAL | 0 refills | Status: AC

## 2016-11-16 MED ORDER — POLYETHYLENE GLYCOL 3350 17 G PO PACK: 17.0000 g | PACK | Freq: Every day | ORAL | 0 refills | Status: DC

## 2016-11-16 MED ORDER — BISACODYL 10 MG RE SUPP
10.0000 mg | Freq: Once | RECTAL | Status: DC
  Filled 2016-11-16: qty 1

## 2016-11-16 NOTE — Progress Notes (Signed)
Pt had 7 beats V tach. MD notified.

## 2016-11-16 NOTE — Progress Notes (Addendum)
Chaplain stopped in to provide prayer with patient per consult. Patient states she is [place].  Patient ask about Prayer in [facility] which she quoted from memory with [place] and they both said [place].  Patient expresses that these prayers are most meaningful for her because she has known them since she was very small.   Patient continues to talk about healing and possibly leaving today if her numbers are good Patient expresses appreciation for Chaplain presence and for all of the care she has received on 4W.  Later walking by room, patient was yelling for her daughter Dorian Pod.  Patient thought her daughter was in the restroom.  Nurse tech came in and opened the bathroom door to assure patient that no one was in the restroom.  Patient may have forgotten that today is Monday and therefore her daughters may be at work today unlike yesterday when they were able to visit together with her.    11/16/16 1020  Clinical Encounter Type  Visited With Patient  Visit Type Initial;Spiritual support  Spiritual Encounters  Spiritual Needs Prayer   Berneice Heinrich

## 2016-11-16 NOTE — Care Management Important Message (Signed)
Important Message  Patient Details  Name: Helen [name] MRN: [id number] Date of Birth: 1930-03-08   Medicare Important Message Given:  Yes    Kerin Salen 11/16/2016, 12:05 [place] Message  Patient Details  Name: Helen [name] MRN: [id number] Date of Birth: 03/08/1930   Medicare Important Message Given:  Yes    Kerin Salen 11/16/2016, 12:05 PM

## 2016-11-16 NOTE — Discharge Summary (Addendum)
Discharge Summary  Helen [name] NFA:[id number] DOB: Dec 10, 1929  PCP: Anthoney Harada, MD  Admit date: 11/11/2016 Discharge date: 11/17/2016  Time spent: >34mins, more than 50% time spent on coordination of care and patient /family counseling  Recommendations for Outpatient Follow-up:  1. F/u with PMD within a week  for hospital discharge follow up, repeat cbc/cmp /tsh at follow up, pmd to repeat cxr in 3-4 weeks for follow up 2. f/u with cardiology for chf management, appointment on 4/3 3. Home health PT/OT/RN/aid/Social worker, patient may need higher level of care due  to possible underline dementia and confusion  Discharge Diagnoses:  Active Hospital Problems   Diagnosis Date Noted  . SOB (shortness of breath) 11/12/2016  . Shortness of breath 11/11/2016  . Chronic respiratory failure with hypoxia ([place]) 11/11/2016  . Acute respiratory failure with hypoxia ([place]) 03/24/2016  . Essential hypertension   . CAD (coronary artery disease)   . Diabetes mellitus without complication ([place])   . Hypercholesteremia     Resolved Hospital Problems   Diagnosis Date Noted Date Resolved  No resolved problems to display.    Discharge Condition: stable  Diet recommendation: heart healthy/carb modified  Filed Weights   11/14/16 0659 11/15/16 0523 11/16/16 0500  Weight: 64.3 kg (141 lb 12.1 oz) 63.7 kg (140 lb 6.9 oz) 64.2 kg (141 lb 9.6 oz)    History of present illness:  PCP: Anthoney Harada, MD  Patient coming from: [facility]  Chief Complaint: Shortness of breath  HPI: Emalea Mix is a 83 y.o. female with medical history significant of diabetes, hypertension, hyperlipidemia, coronary artery disease, chronic combined systolic and diastolic CHF, presents to the emergency room with chief complaint of progressive shortness of breath over the last 3-4 days.  Patient reports that she has been found to be aggressively dyspneic with little activity around her house,  and is not able to do as much as she used to.  She recently had a life stress of 1 child passing away and she had to travel to [place] and skipped her Lasix for couple of days.  She denies any weight gain, she denies any swelling in her lower extremities.  She states that she has been having a sore throat a few days ago, and she has been losing her voice.  She was seen in her PCPs office a few days ago when she was found to have a fever and was placed on doxycycline for presumed pneumonia.  She reports a cough which has been productive of brown sputum at times.  She reports having chills at home.  She denies any chest pain.  She is also been complaining of nausea, which is chronic, but denies any abdominal pain or vomiting.  ED Course: In the ED, her vital signs are stable, she is tachypneic and satting in the 90s on 3 L nasal cannula.  Her blood work is remarkable for liver enzyme elevation as well as an elevated BNP to 1300.  Hospital Course:  Active Problems:   Hypercholesteremia   Diabetes mellitus without complication (HCC)   CAD (coronary artery disease)   Essential hypertension   Acute respiratory failure with hypoxia (HCC)   Shortness of breath   Chronic respiratory failure with hypoxia (HCC)   SOB (shortness of breath)   Acute on chronic hypoxic respiratory failure from acute on chronic combined chf /superimposed pna -Her baseline oxygen athome is in between 1-3 L depending on activity level -cxr: "Chronic opacities in the right upper lobe  and right lung base have increased since prior study. There is likely a component of underlying scarring. But cannot exclude superimposed pneumonia. Small right effusion." -she has  subjective fever and chills as well as a productive cough. She was started on doxycycline as an outpatient did not show any improvement, abx changed to rocephin/zithro, sputum culture + beta lactamase positive HAEMOPHILUS INFLUENZAE  , respiratory viral panel negative,  mrsa screen negative, urine strep pneuom antigen negative , blood culture no growth -abx changed to oral 3rd generation cephalosporin on 3/25 with clinical improvement. -She continue to improve, she is discharged on vantin to finish abx treatment course. -pmd to repeat cxr in 3-4 weeks  Leukocytosis:  no fever, lft improving, cr normalized, blood culture no growth, lactic acid wnl, she clinically is improving,  From dehydration? She is also constipated. She is discharged with pmd follow up and repeat cbc and cxr at hospital follow up.  AKI; cr increase from 0.99 to 1.3 likely from diuresis, UA does has protein, but no infection. Decrease lasix dose, acei held since admission Cr 0.07 at discharge, acei resumed at reduced dose  Elevated liver enzymes -Patient without history of the same, she has nausea however that is chronic ( possible from heart failure gi congestion) . She reports occasional right upper quadrant tenderness again that's been chronic.  -hepatitis panel negative,  right upper quadrant ultrasound showed patient is s/p cholecystectomy, some concerns of cirrhosis, small right pleural effusion( which is known and chronic, presumed from prior radiation+/-heart failure) -. Hold statin for now.  -elevated liver enzymes possibly due to liver congestion in the setting of heart failure and from presumed pneumonia - improving, avoid tylenol. D/c statin (ldl 48)  Pneumonia -sputum culture " ABUNDANT HAEMOPHILUS INFLUENZAE, BETA LACTAMASE POSITIVE" -Blood culture no growth -Started rocephin/zitrho, clinically improving, change abx to vantin pmd to repeat cxr at follow up  Acute on chronic combined systolic and diastolic CHF,  -she has remote exposure to adriamycin for breast cancer, she also has diet controlled diabetes --slight fluid overload on physical exam. Her BNP is elevated at 1331, and she missed her Lasix a couple of days while traveling to [place], she is started on iv  lasix 40mg  bid since admission, decrease lasix from bid to daily since 3/23, lasix held on 3/25 and 3/27 due to clinically dry, lasix 20mg  po daily resumed at discharge.  - negative fluids balance, lower extremity edema has resolved -she is discharged on increased dose of  Coreg( a few nonsustained Vtach and possible short runs of episodic SVT on tele , she is not symptomatic), reduced dose of acei, continue lasix 20mg  daily  Coronary artery disease -Troponin slightly elevated to 0.03-0.04-0.03, not in a pattern consistent with ACS and likely due to demand, she denies chest pain -She recently had a stress test on February 2018, and findings were pertinent for depressed ejection fraction about 30%, and a medium defect of moderate severity which is nonreversible. --continue plavix /coreg (coreg dose increased), statin discontinued due to lft elevation ,(LDL 48)   confusion: Encephalopathy vs hospital delirium,  Cr has normalized, lft trending towards normal, ammonia wnl, blood culture no growth, she had transient mild hyponatremia that is corrected, leukocytosis is improving, no urinary retention, ua no infection. She does has constiaption I have reviewed Past mri obtained from 11/2015 "Age-related cerebral atrophy with mild chronic small vessel ischemic disease. 3. Remote lacunar infarct within the left basal ganglia." Repeat ct head during this hospitalization, no acute findings  Her confusion is most likely hospital delirium with possible baseline vascular dementia ( she does report some memory issues for the last few months), she is to discharge back to her own environment, hopefully her confusion will improve, meanwhile, patient is looking for placement if her confusion does not improve, she is advise to follow with neurology in a few weeks.    noninsulin dependnet Diabetes mellitus -She is not on any medication, suspect diet controlled,  hemoglobin A1c was 6.8 in 11/2015, repeat a1c 7.2 this  hospitalization -Sliding-scale insulin -diet modification -I have discussed with family about starting metformin, they prefer to try diet modification first, diabetic diet education requested   Hyperlipidemia -Hold statin as above due to elevated LFTs -ldl 48, d/c statin at discharge  constipation, stool softener  remote h/o breast cancer, s/p resection, xrt/chemo In remission, With residual right upper extremity lymphedema.  DVT prophylaxis while in the hospital:Lovenox Code Status:Full code  Family Communication: patient and three daughters at bedside  Disposition Plan:  return to independent living with home health PT/RN   Consultants:  none  Procedures:  none  Antibiotics:  Doxycycline from admission to 3/22  Rocephin/zitrhomax from 3/22 to 3/25  vantin from 3/25    Discharge Exam: BP 140/75 (BP Location: Right Arm)   Pulse 66   Temp 97.5 F (36.4 C) (Oral)   Resp 18   Ht 5\' 3"  (1.6 m)   Wt 64.2 kg (141 lb 9.6 oz)   SpO2 98%   BMI 25.08 kg/m   General: frail, but NAD, aaox3 Cardiovascular: RRR Respiratory: diminished at basis, no rales, no rhonchi, no wheezing Abdomen: soft, nontender, +BS Extremity:lower extremity edema presented on admission has resolved.  Discharge Instructions You were cared for by a hospitalist during your hospital stay. If you have any questions about your discharge medications or the care you received while you were in the hospital after you are discharged, you can call the unit and asked to speak with the hospitalist on call if the hospitalist that took care of you is not available. Once you are discharged, your primary care physician will handle any further medical issues. Please note that NO REFILLS for any discharge medications will be authorized once you are discharged, as it is imperative that you return to your primary care physician (or establish a relationship with a primary care physician if you do not  have one) for your aftercare needs so that they can reassess your need for medications and monitor your lab values.  Discharge Instructions    Diet - low sodium heart healthy    Complete by:  As directed    Carb modified   Face-to-face encounter (required for Medicare/Medicaid patients)    Complete by:  As directed    I Kylena Mole certify that this patient is under my care and that I, or a nurse practitioner or physician's assistant working with me, had a face-to-face encounter that meets the physician face-to-face encounter requirements with this patient on 11/17/2016. The encounter with the patient was in whole, or in part for the following medical condition(s) which is the primary reason for home health care (List medical condition): FFT   The encounter with the patient was in whole, or in part, for the following medical condition, which is the primary reason for home health care:  FTT   I certify that, based on my findings, the following services are medically necessary home health services:   Nursing Physical therapy     Reason for  Medically Necessary Home Health Services:  Skilled Nursing- Change/Decline in Patient Status   My clinical findings support the need for the above services:  Cognitive impairments, dementia, or mental confusion  that make it unsafe to leave home   Further, I certify that my clinical findings support that this patient is homebound due to:  Mental confusion   Home Health    Complete by:  As directed    To provide the following care/treatments:   PT [place] work     Increase activity slowly    Complete by:  As directed      Allergies as of 11/17/2016      Reactions   Morphine And Related Nausea And Vomiting   Other Other (See Comments)   Barley & Carrots: learned through allergy testing. Unknown reaction-- MD said not very allergic but not to eat large quantities   Sulfa Antibiotics Hives   Nickel Rash      Medication List    STOP  taking these medications   acetaminophen 500 MG tablet Commonly known as:  TYLENOL   atorvastatin 80 MG tablet Commonly known as:  LIPITOR   doxycycline 100 MG tablet Commonly known as:  VIBRA-TABS   ramipril 5 MG capsule Commonly known as:  ALTACE     TAKE these medications   BIOTIN PO Take 1 tablet by mouth daily.   busPIRone 5 MG tablet Commonly known as:  BUSPAR Take 1 tablet (5 mg total) by mouth 2 (two) times daily.   carvedilol 12.5 MG tablet Commonly known as:  COREG Take 1 tablet (12.5 mg total) by mouth 2 (two) times daily with a meal. What changed:  medication strength  how much to take  when to take this   cefpodoxime 200 MG tablet Commonly known as:  VANTIN Take 1 tablet (200 mg total) by mouth every 12 (twelve) hours.   cholecalciferol 1000 units tablet Commonly known as:  VITAMIN D Take 1,000 Units by mouth every evening.   clopidogrel 75 MG tablet Commonly known as:  PLAVIX Take 75 mg by mouth daily.   fluticasone 50 MCG/ACT nasal spray Commonly known as:  FLONASE Place 1 spray into both nostrils daily as needed for allergies or rhinitis.   FOLIC ACID PO Take 1 tablet by mouth daily.   furosemide 20 MG tablet Commonly known as:  LASIX Take 20 mg by mouth daily.   guaiFENesin 600 MG 12 hr tablet Commonly known as:  MUCINEX Take 1 tablet (600 mg total) by mouth 2 (two) times daily.   lisinopril 2.5 MG tablet Commonly known as:  ZESTRIL Take 1 tablet (2.5 mg total) by mouth daily.   meclizine 25 MG tablet Commonly known as:  ANTIVERT Take 1 tablet (25 mg total) by mouth 3 (three) times daily as needed for dizziness.   Melatonin 5 MG Tabs Take 10 mg by mouth at bedtime as needed (sleep).   multivitamin with minerals Tabs tablet Take 1 tablet by mouth daily.   omeprazole 20 MG capsule Commonly known as:  PRILOSEC Take 20 mg by mouth daily.   polyethylene glycol packet Commonly known as:  MIRALAX / GLYCOLAX Take 17 g by mouth  daily.   senna-docusate 8.6-50 MG tablet Commonly known as:  Senokot-S Take 1 tablet by mouth at bedtime.   vitamin C 500 MG tablet Commonly known as:  ASCORBIC ACID Take 1,000 mg by mouth daily.      Allergies  Allergen Reactions  .  Morphine And Related Nausea And Vomiting  . Other Other (See Comments)    Barley & Carrots: learned through allergy testing. Unknown reaction-- MD said not very allergic but not to eat large quantities  . Sulfa Antibiotics Hives  . Nickel Rash   Follow-up Information    WONG,FRANCIS PATRICK, MD Follow up in 1 week(s).   Specialty:  Family Medicine Why:  hospital discharge follow up, repeat cbc/cmp at follow up pmd to repeat cxr in 3-4 weeks for follow up, repeat tsh in 4wks. Contact information: [place] [address] [phone]        [facility] Follow up in 2 week(s).   Why:  confusion, concerning for underline vascular dementia Contact information: [address]     [address] [place] [address] [place] Follow up.   Specialty:  Home Health Services Why:  [facility] nursing,physical therapy,occupational Data processing manager. Contact information: [address] [place] [address] [phone]            The results of significant diagnostics from this hospitalization (including imaging, microbiology, ancillary and laboratory) are listed below for reference.    Significant Diagnostic Studies: Dg Chest 2 View  Result Date: 11/11/2016 CLINICAL DATA:  Cough, congestion, shortness of breath over the last week. EXAM: CHEST  2 VIEW COMPARISON:  03/20/2016 FINDINGS: Mild cardiomegaly. There is hyperinflation of the lungs compatible with COPD. Chronic densities in the right upper lobe and right lung base, increased since prior study. Cannot exclude superimposed pneumonia. No confluent opacity on the left. Small right effusion suspected.  IMPRESSION: Chronic opacities in the right upper lobe and right lung base have increased since prior study. There is likely a component of underlying scarring. But cannot exclude superimposed pneumonia. Small right effusion. COPD. Cardiomegaly. Electronically Signed   By: Rolm Baptise M.D.   On: 11/11/2016 11:18   Ct Head Wo Contrast  Result Date: 11/16/2016 CLINICAL DATA:  Confusion EXAM: CT HEAD WITHOUT CONTRAST TECHNIQUE: Contiguous axial images were obtained from the base of the skull through the vertex without intravenous contrast. COMPARISON:  MRI 12/13/2015, CT 12/13/2015 FINDINGS: Brain: Generalized atrophy. Chronic microvascular ischemic change in the white matter most notably in the left frontal lobe, unchanged from the prior study. Negative for acute infarct. Negative for hemorrhage or mass. Vascular: Cavernous carotid calcification bilaterally. No hyperdense vessel. Skull: Negative Sinuses/Orbits: Mild mucosal edema paranasal sinuses. Negative orbit. Other: None IMPRESSION: Atrophy with chronic microvascular ischemia.  No acute abnormality. Electronically Signed   By: Franchot Gallo M.D.   On: 11/16/2016 17:05   US Abdomen Limited Ruq  Result Date: 11/11/2016 CLINICAL DATA:  Elevated liver enzymes. Status post cholecystectomy. EXAM: US ABDOMEN LIMITED - RIGHT UPPER QUADRANT COMPARISON:  None. FINDINGS: Gallbladder: Surgically absent. Common bile duct: Diameter: Within normal limits at 7 mm. Liver: No focal lesion identified. Within normal limits in parenchymal echogenicity. Peripheral contours are slightly nodular/irregular raising the possibility of cirrhosis. Pleural effusion noted at the right lung base. IMPRESSION: 1. Status post cholecystectomy. 2. Common bile duct within normal limits in caliber. 3. Peripheral liver contours are slightly nodular/irregular raising the possibility of cirrhosis. No focal mass or lesion identified within the liver. 4. Right pleural effusion. Electronically  Signed   By: Franki Cabot M.D.   On: 11/11/2016 15:38    Microbiology: Recent Results (from the past 240 hour(s))  MRSA PCR Screening     Status:  None   Collection Time: 11/12/16  8:28 AM  Result Value Ref Range Status   MRSA by PCR NEGATIVE NEGATIVE Final    Comment:        The GeneXpert MRSA Assay (FDA approved for NASAL specimens only), is one component of a comprehensive MRSA colonization surveillance program. It is not intended to diagnose MRSA infection nor to guide or monitor treatment for MRSA infections.   Respiratory Panel by PCR     Status: None   Collection Time: 11/12/16  8:28 AM  Result Value Ref Range Status   Adenovirus NOT DETECTED NOT DETECTED Final   Coronavirus 229E NOT DETECTED NOT DETECTED Final   Coronavirus HKU1 NOT DETECTED NOT DETECTED Final   Coronavirus NL63 NOT DETECTED NOT DETECTED Final   Coronavirus OC43 NOT DETECTED NOT DETECTED Final   Metapneumovirus NOT DETECTED NOT DETECTED Final   Rhinovirus / Enterovirus NOT DETECTED NOT DETECTED Final   Influenza A NOT DETECTED NOT DETECTED Final   Influenza B NOT DETECTED NOT DETECTED Final   Parainfluenza Virus 1 NOT DETECTED NOT DETECTED Final   Parainfluenza Virus 2 NOT DETECTED NOT DETECTED Final   Parainfluenza Virus 3 NOT DETECTED NOT DETECTED Final   Parainfluenza Virus 4 NOT DETECTED NOT DETECTED Final   Respiratory Syncytial Virus NOT DETECTED NOT DETECTED Final   Bordetella pertussis NOT DETECTED NOT DETECTED Final   Chlamydophila pneumoniae NOT DETECTED NOT DETECTED Final   Mycoplasma pneumoniae NOT DETECTED NOT DETECTED Final    Comment: Performed at [facility], [place] [address], [place], [place] [address]  Culture, expectorated sputum-assessment     Status: None   Collection Time: 11/12/16  8:37 AM  Result Value Ref Range Status   Specimen Description SPUTUM  Final   Special Requests NONE  Final   Sputum evaluation THIS SPECIMEN IS ACCEPTABLE FOR SPUTUM CULTURE  Final   Report  Status 11/12/2016 FINAL  Final  Culture, respiratory (NON-Expectorated)     Status: None   Collection Time: 11/12/16  8:37 AM  Result Value Ref Range Status   Specimen Description SPUTUM  Final   Special Requests NONE Reflexed from [id number]  Final   Gram Stain   Final    MODERATE WBC PRESENT, PREDOMINANTLY PMN RARE GRAM POSITIVE COCCI IN PAIRS ABUNDANT GRAM NEGATIVE COCCI FEW GRAM NEGATIVE RODS    Culture   Final    ABUNDANT HAEMOPHILUS INFLUENZAE BETA LACTAMASE POSITIVE Performed at [facility], [place] [address] [place], [place] [address]    Report Status 11/13/2016 FINAL  Final  Culture, blood (routine x 2)     Status: None (Preliminary result)   Collection Time: 11/14/16 10:23 AM  Result Value Ref Range Status   Specimen Description BLOOD RIGHT ANTECUBITAL  Final   Special Requests BOTTLES DRAWN AEROBIC AND ANAEROBIC 5CC  Final   Culture   Final    NO GROWTH 2 DAYS Performed at [facility], [place] [address], [place], [place] [address]    Report Status PENDING  Incomplete  Culture, blood (routine x 2)     Status: None (Preliminary result)   Collection Time: 11/14/16 10:29 AM  Result Value Ref Range Status   Specimen Description BLOOD RIGHT HAND  Final   Special Requests IN PEDIATRIC BOTTLE 2CC  Final   Culture   Final    NO GROWTH 2 DAYS Performed at [facility], [place] [address] [place], [place] [address]    Report Status PENDING  Incomplete     Labs: Basic Metabolic Panel:  Recent Labs Lab 11/13/16 0543 11/14/16 0503 11/15/16 0433 11/16/16 0521 11/17/16 0626  NA 134* 132* 134* 137 137  K 4.2 4.1 4.5 4.8 5.3*  CL 93* 88* 93* 95* 94*  CO2 29 33* 30 33* 36*  GLUCOSE 144* 160* 140* 153* 146*  BUN 56* 57* 51* 39* 26*  CREATININE 1.30* 1.20* 1.07* 0.77 0.72  CALCIUM 8.9 8.6* 8.7* 8.8* 8.9  MG  --   --   --  2.2 2.1   Liver Function Tests:  Recent Labs Lab 11/13/16 0543 11/14/16 0503 11/15/16 0433 11/16/16 0521 11/17/16 0626  AST 143*  89* 45* 33 27  ALT 287* 233* 167* 130* 100*  ALKPHOS 186* 196* 164* 147* 135*  BILITOT 1.0 0.9 0.8 0.7 0.9  PROT 6.7 6.6 6.3* 6.8 6.7  ALBUMIN 3.1* 2.9* 2.8* 3.0* 2.9*   No results for input(s): LIPASE, AMYLASE in the last 168 hours.  Recent Labs Lab 11/14/16 1029 11/15/16 0433 11/16/16 0521  AMMONIA 32 39* 13   CBC:  Recent Labs Lab 11/11/16 1132  11/13/16 0543 11/14/16 0503 11/15/16 0433 11/16/16 0521 11/17/16 0626  WBC 10.2  < > 14.4* 17.9* 15.9* 18.3* 15.8*  NEUTROABS 8.5*  --  11.8*  --  13.3* 15.4* 13.6*  HGB 11.5*  < > 11.4* 10.9* 10.6* 11.2* 11.2*  HCT 34.9*  < > 35.8* 33.8* 33.4* 36.0 36.3  MCV 82.9  < > 85.6 85.6 86.5 86.5 87.3  PLT 185  < > 164 172 163 184 203  < > = values in this interval not displayed. Cardiac Enzymes:  Recent Labs Lab 11/11/16 1132 11/11/16 1732 11/11/16 2319 11/12/16 0615  TROPONINI 0.03* 0.04* 0.04* 0.03*   BNP: BNP (last 3 results)  Recent Labs  03/20/16 0500 11/11/16 1132  BNP 1,657.4* 1,331.2*    ProBNP (last 3 results) No results for input(s): PROBNP in the last 8760 hours.  CBG:  Recent Labs Lab 11/16/16 0736 11/16/16 1209 11/16/16 1720 11/16/16 2155 11/17/16 0739  GLUCAP 184* 207* 179* 140* 150*       Signed:  Karan Inclan MD, PhD  [facility] 11/17/2016, 11:14 AM

## 2016-11-16 NOTE — Progress Notes (Signed)
NUTRITION NOTE  RD consulted for nutrition education regarding diabetes.   Lab Results  Component Value Date   HGBA1C 7.2 (H) 11/13/2016    RD provided "Carbohydrate Counting for People with Diabetes" AND "Label Reading Tips for People with Diabetes" handouts from the Academy of Nutrition and Dietetics. This RD spoke with pt last week about low Na diet. Pt had reported at that time that she lives at an [employer] [facility] and that all 3 meals/day are provided by food service staff there. She had indicated at that time, and again today, that she is unable to self-select meal items. Curious if daughters would be able to talk with food service staff at facility and make them aware of pt's dietary needs?   No one at bedside but pt reports that she has 5 daughters living in various towns in [place]. From discussion, it does not sound like daughters provide outside food on a consistent basis but that pt has a box of snacks provided by daughters. Pt does not drive.   Used the menu in pt's room as a teaching tool and wrote a note about number of carbohydrates permitted per meal on the top of all pages of her menu. Also talked about Heart Healthy component of diet order and foods she is able/unable to have d/t this. Teach back method used.  Expect fair compliance based on pt's report of inability to self-select meal items at [facility].  Body mass index is 25.08 kg/m. Pt meets criteria for overweight, appropriate for age based on current BMI.  Current diet order is Heart Healthy/Carb Modified, patient is consuming approximately 75% of meals at this time. Labs and medications reviewed. Plan for Metformin as an outpatient. HgbA1c checked this admission and was 7.2%. No further nutrition interventions warranted at this time. RD contact information provided. If additional nutrition issues arise, please re-consult RD.    Jarome Matin, MS, RD, LDN, [facility] Inpatient Clinical  Dietitian Pager # [phone] After hours/weekend pager # [phone]

## 2016-11-16 NOTE — Progress Notes (Signed)
qPhysical Therapy Treatment Patient Details Name: Helen [name] MRN: [id number] DOB: 02/04/1930 Today's Date: 11/16/2016    History of Present Illness 83 y.o. female with medical history significant of diabetes, hypertension, hyperlipidemia, coronary artery disease, chronic combined systolic and diastolic CHF, presents to the emergency room with chief complaint of progressive shortness of breath over the last 3-4 days. Pt's son recently died, she missed some doses of Lasix. She reports she uses 2-3L of home O2. Dx of respiratory failure, fluid overload, possibility of superimposed pneumonia.     PT Comments    Pt ambulated in hallway and continues to require seated rest break.  Pt also more unsteady with head movements (ie looking around environment) during ambulation.    Follow Up Recommendations  Home health PT;Supervision for mobility/OOB     Equipment Recommendations  None recommended by PT    Recommendations for Other Services       Precautions / Restrictions Precautions Precautions: Fall Precaution Comments: chronic 2-3L O2    Mobility  Bed Mobility               General bed mobility comments: pt up in recliner on arrival  Transfers Overall transfer level: Needs assistance Equipment used: Rolling walker (2 wheeled) Transfers: Sit to/from Stand Sit to Stand: Min guard         General transfer comment: min/guard for safety, verbal cues for UE placement  Ambulation/Gait Ambulation/Gait assistance: Min assist Ambulation Distance (Feet): 180 Feet Assistive device: Rolling walker (2 wheeled) Gait Pattern/deviations: Step-through pattern;Decreased stride length     General Gait Details: pt performed 90'x2 with seated rest break due to fatigue.  pt with occasional unsteadiness with head movements in hallway. Pt remained on 3L O2 [place], denies SOB, SpO2 97% on 3L O2 upon returning to room   Stairs            Wheelchair Mobility    Modified Rankin  (Stroke Patients Only)       Balance Overall balance assessment: Needs assistance         Standing balance support: Bilateral upper extremity supported Standing balance-Leahy Scale: Poor Standing balance comment: RW for UE support, unsteady with head movements during ambulation                            Cognition Arousal/Alertness: Awake/alert Behavior During Therapy: WFL for tasks assessed/performed Overall Cognitive Status: Within Functional Limits for tasks assessed                                        Exercises      General Comments        Pertinent Vitals/Pain Pain Assessment: No/denies pain    Home Living                      Prior Function            PT Goals (current goals can now be found in the care plan section) Progress towards PT goals: Progressing toward goals    Frequency    Min 3X/week      PT Plan Current plan remains appropriate    Co-evaluation             End of Session Equipment Utilized During Treatment: Oxygen Activity Tolerance: Patient tolerated treatment well Patient left: with call bell/phone within reach;in  bed;with bed alarm set   PT Visit Diagnosis: Other abnormalities of gait and mobility (R26.89)     Time: 9447-3958 PT Time Calculation (min) (ACUTE ONLY): 22 min  Charges:  $Gait Training: 8-22 mins                    G Codes:       Carmelia Bake, PT, DPT 11/16/2016 Pager: [phone]    York Ram E 11/16/2016, 11:25 AM

## 2016-11-16 NOTE — Progress Notes (Signed)
CSW contacted Juliann Pulse [phone]) and confirmed requested documents. CSW faxed patient's requested documents to [employer] school teacher).  Abundio Miu, [place] Social Worker [facility] Cell#: [phone]

## 2016-11-16 NOTE — Progress Notes (Signed)
PROGRESS NOTE  Helen [name] EXB:[id number] DOB: April 15, 1930 DOA: 11/11/2016 PCP: Anthoney Harada, MD  HPI/Recap of past 24 hours:  Improving, Less hoarse, less cough, no edema, no fever, no chest pain,  on 3liter oxygen ( report baseline 2-3 liter) But confusion getting worse  Daughters at bedside  Assessment/Plan: Active Problems:   Hypercholesteremia   Diabetes mellitus without complication ([place])   CAD (coronary artery disease)   Essential hypertension   Acute respiratory failure with hypoxia (HCC)   Shortness of breath   Chronic respiratory failure with hypoxia (HCC)   SOB (shortness of breath)  Acute on chronic hypoxic respiratory failure from acute on chronic combined chf /superimposed pna -Her baseline oxygen at home is in between 1-3 L depending on activity level -slight fluid overload on physical exam. Her BNP is elevated, and she missed her Lasix a couple of days while traveling to [place], she is started on iv lasix 40mg  bid since admission, she seems improving on this ,cr increased on 3/23, decrease lasix from bid to daily since 3/23 -Chest x-ray raises the possibility of superimposed pneumonia,she has  subjective fever and chills as well as a productive cough. She was started on doxycycline as an outpatient did not show any improvement, abx changed to rocephin/zithro, sputum culture + beta lactamase positive HAEMOPHILUS INFLUENZAE  , respiratory viral panel negative, mrsa screen negative, urine strep pneuom antigen negative abx changed to oral 3rd generation cephalosporin on 3/25 with clinical improvement.  AKI; cr increase from 0.99 to 1.3 likely from diuresis, UA does has protein, but no infection. Decrease lasix dose, hold acei Cr normalized  Elevated liver enzymes -Patient without history of the same, she has nausea however that is chronic ( possible from heart failure gi congestion) . She reports occasional right upper quadrant tenderness again that's been  chronic.  -hepatitis panel negative,  right upper quadrant ultrasound showed patient is s/p cholecystectomy, some concerns of cirrhosis, small right pleural effusion( which is known and chronic, presumed from prior radiation+/-heart failure) -. Hold statin for now.  -elevated liver enzymes possibly due to liver congestion in the setting of heart failure and from presumed pneumonia - improving  Acute on chronic combined systolic and diastolic CHF -she has remote exposure to adriamycin for breast cancer, she also has diet controlled diabetes -Diuresis as above, strict I's and O's, daily weights - negative fluids balance, lower extremity edema has improved  Coronary artery disease -Troponin slightly elevated to 0.03-0.04-0.03, not in a pattern consistent with ACS and likely due to demand,  -She recently had a stress test on February 2018, and findings were pertinent for depressed ejection fraction about 30%, and a medium defect of moderate severity which is nonreversible. -denies chest pain -continue plavix /coreg  Pneumonia -Started rocephin/zitrho -sputum culture " ABUNDANT HAEMOPHILUS INFLUENZAE, BETA LACTAMASE POSITIVE" Blood culture no growth -clinically improving, change abx to vantin  Encephalopathy vs delirium, she has intermittent confusion reported by family and RN on 3/24, when I exam her, she is aaox3 She does has elevated wbc 17.9 today ( 14.4 yesterday), he sodium is 132 today ( 134 yesterday) He cr and lft has improved, ammonia level unremarkable,  blood culture no growth,  I have reviewed Past mri obtained from 11/2015 "Age-related cerebral atrophy with mild chronic small vessel ischemic disease. 3. Remote lacunar infarct within the left basal ganglia." Seems more confused, will get ct head , planned discharge cancelled   noninsulin dependnet Diabetes mellitus -She is not on any medication,  suspect diet controlled,  hemoglobin A1c was 6.8 in 11/2015, repeat a1c 7.2 this  hospitalization -Sliding-scale insulin -diet modification -I have discussed with family about starting metformin, they prefer to try diet modification first, diabetic diet education requested   Hyperlipidemia -Hold statin as above due to elevated LFTs -check lipid panel, may need to resume lipitor at a lower dose at discharge    DVT prophylaxis while in the hospital: Lovenox Code Status: Full code  Family Communication: patient and daughter Dorian Pod  at bedside  Disposition Plan: return to previous facility with home health , hopefully on 3/27   Consultants:  none  Procedures:  none  Antibiotics:  Doxycycline from admission to 3/22  Rocephin/zitrhomax from 3/22 to 3/25  vantin from 3/25    Objective: BP 140/60 (BP Location: Left Arm)   Pulse 75   Temp 97.8 F (36.6 C) (Oral)   Resp 16   Ht 5\' 3"  (1.6 m)   Wt 64.2 kg (141 lb 9.6 oz)   SpO2 97%   BMI 25.08 kg/m   Intake/Output Summary (Last 24 hours) at 11/16/16 1802 Last data filed at 11/16/16 1355  Gross per 24 hour  Intake              603 ml  Output              700 ml  Net              -97 ml   Filed Weights   11/14/16 0659 11/15/16 0523 11/16/16 0500  Weight: 64.3 kg (141 lb 12.1 oz) 63.7 kg (140 lb 6.9 oz) 64.2 kg (141 lb 9.6 oz)    Exam:   General:  Frail, less hoarse, but NAD, confusion seems worse, she does report has been having memory issues for a few months  Cardiovascular: RRR  Respiratory: diminished right basis, no rhonchi, no wheezing, no crackles  Abdomen: Soft/ND/NT, positive BS  Musculoskeletal: No Edema  Neuro: confusion but able to self correct ,does has poor memory  Data Reviewed: Basic Metabolic Panel:  Recent Labs Lab 11/12/16 0615 11/13/16 0543 11/14/16 0503 11/15/16 0433 11/16/16 0521  NA 135 134* 132* 134* 137  K 4.4 4.2 4.1 4.5 4.8  CL 95* 93* 88* 93* 95*  CO2 31 29 33* 30 33*  GLUCOSE 189* 144* 160* 140* 153*  BUN 46* 56* 57* 51* 39*  CREATININE  0.99 1.30* 1.20* 1.07* 0.77  CALCIUM 8.7* 8.9 8.6* 8.7* 8.8*  MG  --   --   --   --  2.2   Liver Function Tests:  Recent Labs Lab 11/12/16 0615 11/13/16 0543 11/14/16 0503 11/15/16 0433 11/16/16 0521  AST 177* 143* 89* 45* 33  ALT 316* 287* 233* 167* 130*  ALKPHOS 157* 186* 196* 164* 147*  BILITOT 0.9 1.0 0.9 0.8 0.7  PROT 6.5 6.7 6.6 6.3* 6.8  ALBUMIN 3.0* 3.1* 2.9* 2.8* 3.0*   No results for input(s): LIPASE, AMYLASE in the last 168 hours.  Recent Labs Lab 11/14/16 1029 11/15/16 0433 11/16/16 0521  AMMONIA 32 39* 13   CBC:  Recent Labs Lab 11/11/16 1132 11/12/16 0615 11/13/16 0543 11/14/16 0503 11/15/16 0433 11/16/16 0521  WBC 10.2 12.1* 14.4* 17.9* 15.9* 18.3*  NEUTROABS 8.5*  --  11.8*  --  13.3* 15.4*  HGB 11.5* 10.9* 11.4* 10.9* 10.6* 11.2*  HCT 34.9* 33.6* 35.8* 33.8* 33.4* 36.0  MCV 82.9 85.5 85.6 85.6 86.5 86.5  PLT 185 171 164 172 163 184  Cardiac Enzymes:    Recent Labs Lab 11/11/16 1132 11/11/16 1732 11/11/16 2319 11/12/16 0615  TROPONINI 0.03* 0.04* 0.04* 0.03*   BNP (last 3 results)  Recent Labs  03/20/16 0500 11/11/16 1132  BNP 1,657.4* 1,331.2*    ProBNP (last 3 results) No results for input(s): PROBNP in the last 8760 hours.  CBG:  Recent Labs Lab 11/15/16 1143 11/15/16 1704 11/16/16 0736 11/16/16 1209 11/16/16 1720  GLUCAP 226* 126* 184* 207* 179*    Recent Results (from the past 240 hour(s))  MRSA PCR Screening     Status: None   Collection Time: 11/12/16  8:28 AM  Result Value Ref Range Status   MRSA by PCR NEGATIVE NEGATIVE Final    Comment:        The GeneXpert MRSA Assay (FDA approved for NASAL specimens only), is one component of a comprehensive MRSA colonization surveillance program. It is not intended to diagnose MRSA infection nor to guide or monitor treatment for MRSA infections.   Respiratory Panel by PCR     Status: None   Collection Time: 11/12/16  8:28 AM  Result Value Ref Range Status     Adenovirus NOT DETECTED NOT DETECTED Final   Coronavirus 229E NOT DETECTED NOT DETECTED Final   Coronavirus HKU1 NOT DETECTED NOT DETECTED Final   Coronavirus NL63 NOT DETECTED NOT DETECTED Final   Coronavirus OC43 NOT DETECTED NOT DETECTED Final   Metapneumovirus NOT DETECTED NOT DETECTED Final   Rhinovirus / Enterovirus NOT DETECTED NOT DETECTED Final   Influenza A NOT DETECTED NOT DETECTED Final   Influenza B NOT DETECTED NOT DETECTED Final   Parainfluenza Virus 1 NOT DETECTED NOT DETECTED Final   Parainfluenza Virus 2 NOT DETECTED NOT DETECTED Final   Parainfluenza Virus 3 NOT DETECTED NOT DETECTED Final   Parainfluenza Virus 4 NOT DETECTED NOT DETECTED Final   Respiratory Syncytial Virus NOT DETECTED NOT DETECTED Final   Bordetella pertussis NOT DETECTED NOT DETECTED Final   Chlamydophila pneumoniae NOT DETECTED NOT DETECTED Final   Mycoplasma pneumoniae NOT DETECTED NOT DETECTED Final    Comment: Performed at [facility], [place] [address], [place], [place] [address]  Culture, expectorated sputum-assessment     Status: None   Collection Time: 11/12/16  8:37 AM  Result Value Ref Range Status   Specimen Description SPUTUM  Final   Special Requests NONE  Final   Sputum evaluation THIS SPECIMEN IS ACCEPTABLE FOR SPUTUM CULTURE  Final   Report Status 11/12/2016 FINAL  Final  Culture, respiratory (NON-Expectorated)     Status: None   Collection Time: 11/12/16  8:37 AM  Result Value Ref Range Status   Specimen Description SPUTUM  Final   Special Requests NONE Reflexed from [id number]  Final   Gram Stain   Final    MODERATE WBC PRESENT, PREDOMINANTLY PMN RARE GRAM POSITIVE COCCI IN PAIRS ABUNDANT GRAM NEGATIVE COCCI FEW GRAM NEGATIVE RODS    Culture   Final    ABUNDANT HAEMOPHILUS INFLUENZAE BETA LACTAMASE POSITIVE Performed at [facility], [place] [address], [place], [place] [address]    Report Status 11/13/2016 FINAL  Final  Culture, blood (routine x 2)     Status:  None (Preliminary result)   Collection Time: 11/14/16 10:23 AM  Result Value Ref Range Status   Specimen Description BLOOD RIGHT ANTECUBITAL  Final   Special Requests BOTTLES DRAWN AEROBIC AND ANAEROBIC 5CC  Final   Culture   Final    NO GROWTH 2  DAYS Performed at [facility], [place] [address] [place], [place] [address]    Report Status PENDING  Incomplete  Culture, blood (routine x 2)     Status: None (Preliminary result)   Collection Time: 11/14/16 10:29 AM  Result Value Ref Range Status   Specimen Description BLOOD RIGHT HAND  Final   Special Requests IN PEDIATRIC BOTTLE 2CC  Final   Culture   Final    NO GROWTH 2 DAYS Performed at [facility], [place] [address], [place],  [address]    Report Status PENDING  Incomplete     Studies: Ct Head Wo Contrast  Result Date: 11/16/2016 CLINICAL DATA:  Confusion EXAM: CT HEAD WITHOUT CONTRAST TECHNIQUE: Contiguous axial images were obtained from the base of the skull through the vertex without intravenous contrast. COMPARISON:  MRI 12/13/2015, CT 12/13/2015 FINDINGS: Brain: Generalized atrophy. Chronic microvascular ischemic change in the white matter most notably in the left frontal lobe, unchanged from the prior study. Negative for acute infarct. Negative for hemorrhage or mass. Vascular: Cavernous carotid calcification bilaterally. No hyperdense vessel. Skull: Negative Sinuses/Orbits: Mild mucosal edema paranasal sinuses. Negative orbit. Other: None IMPRESSION: Atrophy with chronic microvascular ischemia.  No acute abnormality. Electronically Signed   By: Franchot Gallo M.D.   On: 11/16/2016 17:05    Scheduled Meds: . bisacodyl  10 mg Rectal Once  . busPIRone  5 mg Oral BID  . carvedilol  6.25 mg Oral BID  . cefpodoxime  200 mg Oral Q12H  . clopidogrel  75 mg Oral Daily  . enoxaparin (LOVENOX) injection  40 mg Subcutaneous Q24H  . fluticasone  1 spray Each Nare Daily  . guaiFENesin  600 mg Oral BID  . insulin aspart   0-9 Units Subcutaneous TID WC  . ipratropium-albuterol  3 mL Nebulization BID  . multivitamin with minerals  1 tablet Oral Q1200  . pantoprazole  40 mg Oral Daily  . polyethylene glycol  17 g Oral Daily  . sodium chloride  250 mL Intravenous Once  . sodium chloride flush  3 mL Intravenous Q12H    Continuous Infusions:   Time spent: 79mins, more than 50% time spent on coordination of care, patient and family counseling.  Eden Rho MD, PhD  [facility] Pager [phone]. If 7PM-7AM, please contact night-coverage at [email or web address], password [facility] 11/16/2016, 6:02 PM  LOS: 4 days

## 2016-11-16 NOTE — Care Management Note (Signed)
Case Management Note  Patient Details  Name: Helen [name] MRN: [id number] Date of Birth: September 17, 1929  Subjective/Objective: Received call for [facility]. Spoke to dtr [name]-chose [facility]-rep Tim aware & following. Patient for return back to [facility]. Per dtr able to transport hoe on own.   Await HHRN,PT,OT,aide,social worker,[id number] order.                   Action/Plan:d/c home w/HHC.   Expected Discharge Date:  11/16/16               Expected Discharge Plan:  [place]  In-House Referral:  Clinical Social Work  Discharge planning Services  CM Consult  Post Acute Care Choice:  NA Choice offered to:  Patient  DME Arranged:  N/A DME Agency:  NA  HH Arranged:  RN, PT [place] Agency:  NA, [facility] [employer]  Status of Service:  In process, will continue to follow  If discussed at Long Length of Stay Meetings, dates discussed:    Additional Comments:  Dessa Phi, RN 11/16/2016, 8:07 PM

## 2016-11-17 LAB — CBC WITH DIFFERENTIAL/PLATELET
Basophils Absolute: 0 10*3/uL (ref 0.0–0.1)
Basophils Relative: 0 %
Eosinophils Absolute: 0.1 10*3/uL (ref 0.0–0.7)
Eosinophils Relative: 1 %
HCT: 36.3 % (ref 36.0–46.0)
Hemoglobin: 11.2 g/dL — ABNORMAL LOW (ref 12.0–15.0)
Lymphocytes Relative: 5 %
Lymphs Abs: 0.8 10*3/uL (ref 0.7–4.0)
MCH: 26.9 pg (ref 26.0–34.0)
MCHC: 30.9 g/dL (ref 30.0–36.0)
MCV: 87.3 fL (ref 78.0–100.0)
Monocytes Absolute: 1.3 10*3/uL — ABNORMAL HIGH (ref 0.1–1.0)
Monocytes Relative: 8 %
Neutro Abs: 13.6 10*3/uL — ABNORMAL HIGH (ref 1.7–7.7)
Neutrophils Relative %: 86 %
Platelets: 203 10*3/uL (ref 150–400)
RBC: 4.16 MIL/uL (ref 3.87–5.11)
RDW: 16.9 % — ABNORMAL HIGH (ref 11.5–15.5)
WBC: 15.8 10*3/uL — ABNORMAL HIGH (ref 4.0–10.5)

## 2016-11-17 LAB — COMPREHENSIVE METABOLIC PANEL
ALT: 100 U/L — ABNORMAL HIGH (ref 14–54)
AST: 27 U/L (ref 15–41)
Albumin: 2.9 g/dL — ABNORMAL LOW (ref 3.5–5.0)
Alkaline Phosphatase: 135 U/L — ABNORMAL HIGH (ref 38–126)
Anion gap: 7 (ref 5–15)
BUN: 26 mg/dL — ABNORMAL HIGH (ref 6–20)
CO2: 36 mmol/L — ABNORMAL HIGH (ref 22–32)
Calcium: 8.9 mg/dL (ref 8.9–10.3)
Chloride: 94 mmol/L — ABNORMAL LOW (ref 101–111)
Creatinine, Ser: 0.72 mg/dL (ref 0.44–1.00)
GFR calc Af Amer: >60 mL/min (ref >60)
GFR calc non Af Amer: >60 mL/min (ref >60)
Glucose, Bld: 146 mg/dL — ABNORMAL HIGH (ref 65–99)
Potassium: 5.3 mmol/L — ABNORMAL HIGH (ref 3.5–5.1)
Sodium: 137 mmol/L (ref 135–145)
Total Bilirubin: 0.9 mg/dL (ref 0.3–1.2)
Total Protein: 6.7 g/dL (ref 6.5–8.1)

## 2016-11-17 LAB — MAGNESIUM: Magnesium: 2.1 mg/dL (ref 1.7–2.4)

## 2016-11-17 LAB — GLUCOSE, CAPILLARY
Glucose-Capillary: 150 mg/dL — ABNORMAL HIGH (ref 65–99)
Glucose-Capillary: 228 mg/dL — ABNORMAL HIGH (ref 65–99)

## 2016-11-17 LAB — TSH: TSH: 4.721 u[IU]/mL — ABNORMAL HIGH (ref 0.350–4.500)

## 2016-11-17 LAB — FOLATE: Folate: 21.1 ng/mL (ref >5.9)

## 2016-11-17 LAB — VITAMIN B12: Vitamin B-12: 1326 pg/mL — ABNORMAL HIGH (ref 180–914)

## 2016-11-17 LAB — T4, FREE: Free T4: 1.02 ng/dL (ref 0.61–1.12)

## 2016-11-17 MED ORDER — CARVEDILOL 12.5 MG PO TABS: 12.5000 mg | ORAL_TABLET | Freq: Two times a day (BID) | ORAL | 0 refills | Status: DC

## 2016-11-17 MED ORDER — SENNOSIDES-DOCUSATE SODIUM 8.6-50 MG PO TABS: 1.0000 | ORAL_TABLET | Freq: Every day | ORAL | 0 refills | Status: DC

## 2016-11-17 MED ORDER — SENNOSIDES-DOCUSATE SODIUM 8.6-50 MG PO TABS
1.0000 | ORAL_TABLET | Freq: Two times a day (BID) | ORAL | Status: DC
  Administered 2016-11-17: 1 via ORAL
  Filled 2016-11-17: qty 1

## 2016-11-18 DIAGNOSIS — J440 Chronic obstructive pulmonary disease with acute lower respiratory infection: Secondary | ICD-10-CM | POA: Diagnosis not present

## 2016-11-18 DIAGNOSIS — E1151 Type 2 diabetes mellitus with diabetic peripheral angiopathy without gangrene: Secondary | ICD-10-CM | POA: Diagnosis not present

## 2016-11-18 DIAGNOSIS — J14 Pneumonia due to Hemophilus influenzae: Secondary | ICD-10-CM | POA: Diagnosis not present

## 2016-11-18 DIAGNOSIS — I2510 Atherosclerotic heart disease of native coronary artery without angina pectoris: Secondary | ICD-10-CM | POA: Diagnosis not present

## 2016-11-18 DIAGNOSIS — I5043 Acute on chronic combined systolic (congestive) and diastolic (congestive) heart failure: Secondary | ICD-10-CM | POA: Diagnosis not present

## 2016-11-18 DIAGNOSIS — I110 Hypertensive heart disease with heart failure: Secondary | ICD-10-CM | POA: Diagnosis not present

## 2016-11-19 LAB — CULTURE, BLOOD (ROUTINE X 2)
Culture: NO GROWTH
Culture: NO GROWTH

## 2016-11-20 ENCOUNTER — Ambulatory Visit: Payer: Medicare Other | Admitting: Cardiology

## 2016-11-20 DIAGNOSIS — I110 Hypertensive heart disease with heart failure: Secondary | ICD-10-CM | POA: Diagnosis not present

## 2016-11-20 DIAGNOSIS — E1151 Type 2 diabetes mellitus with diabetic peripheral angiopathy without gangrene: Secondary | ICD-10-CM | POA: Diagnosis not present

## 2016-11-20 DIAGNOSIS — I2510 Atherosclerotic heart disease of native coronary artery without angina pectoris: Secondary | ICD-10-CM | POA: Diagnosis not present

## 2016-11-20 DIAGNOSIS — J14 Pneumonia due to Hemophilus influenzae: Secondary | ICD-10-CM | POA: Diagnosis not present

## 2016-11-20 DIAGNOSIS — I5043 Acute on chronic combined systolic (congestive) and diastolic (congestive) heart failure: Secondary | ICD-10-CM | POA: Diagnosis not present

## 2016-11-20 DIAGNOSIS — J440 Chronic obstructive pulmonary disease with acute lower respiratory infection: Secondary | ICD-10-CM | POA: Diagnosis not present

## 2016-11-22 DIAGNOSIS — Z8673 Personal history of transient ischemic attack (TIA), and cerebral infarction without residual deficits: Secondary | ICD-10-CM

## 2016-11-22 HISTORY — DX: Personal history of transient ischemic attack (TIA), and cerebral infarction without residual deficits: Z86.73

## 2016-11-23 ENCOUNTER — Ambulatory Visit: Payer: Medicare Other | Admitting: Cardiology

## 2016-11-23 DIAGNOSIS — E8770 Fluid overload, unspecified: Secondary | ICD-10-CM | POA: Diagnosis not present

## 2016-11-23 DIAGNOSIS — I2510 Atherosclerotic heart disease of native coronary artery without angina pectoris: Secondary | ICD-10-CM | POA: Diagnosis not present

## 2016-11-23 DIAGNOSIS — J14 Pneumonia due to Hemophilus influenzae: Secondary | ICD-10-CM | POA: Diagnosis not present

## 2016-11-23 DIAGNOSIS — I5032 Chronic diastolic (congestive) heart failure: Secondary | ICD-10-CM | POA: Diagnosis not present

## 2016-11-23 DIAGNOSIS — J440 Chronic obstructive pulmonary disease with acute lower respiratory infection: Secondary | ICD-10-CM | POA: Diagnosis not present

## 2016-11-23 DIAGNOSIS — E1151 Type 2 diabetes mellitus with diabetic peripheral angiopathy without gangrene: Secondary | ICD-10-CM | POA: Diagnosis not present

## 2016-11-23 DIAGNOSIS — I499 Cardiac arrhythmia, unspecified: Secondary | ICD-10-CM | POA: Diagnosis not present

## 2016-11-23 DIAGNOSIS — R600 Localized edema: Secondary | ICD-10-CM | POA: Diagnosis not present

## 2016-11-23 DIAGNOSIS — I890 Lymphedema, not elsewhere classified: Secondary | ICD-10-CM | POA: Diagnosis not present

## 2016-11-23 DIAGNOSIS — J189 Pneumonia, unspecified organism: Secondary | ICD-10-CM | POA: Diagnosis not present

## 2016-11-23 DIAGNOSIS — E119 Type 2 diabetes mellitus without complications: Secondary | ICD-10-CM | POA: Diagnosis not present

## 2016-11-23 DIAGNOSIS — I272 Pulmonary hypertension, unspecified: Secondary | ICD-10-CM | POA: Diagnosis not present

## 2016-11-23 DIAGNOSIS — R5381 Other malaise: Secondary | ICD-10-CM | POA: Diagnosis not present

## 2016-11-23 DIAGNOSIS — I110 Hypertensive heart disease with heart failure: Secondary | ICD-10-CM | POA: Diagnosis not present

## 2016-11-23 DIAGNOSIS — I5043 Acute on chronic combined systolic (congestive) and diastolic (congestive) heart failure: Secondary | ICD-10-CM | POA: Diagnosis not present

## 2016-11-23 DIAGNOSIS — R769 Abnormal immunological finding in serum, unspecified: Secondary | ICD-10-CM | POA: Diagnosis not present

## 2016-11-24 ENCOUNTER — Ambulatory Visit: Payer: Medicare Other | Admitting: Cardiology

## 2016-11-25 DIAGNOSIS — E1151 Type 2 diabetes mellitus with diabetic peripheral angiopathy without gangrene: Secondary | ICD-10-CM | POA: Diagnosis not present

## 2016-11-25 DIAGNOSIS — I5043 Acute on chronic combined systolic (congestive) and diastolic (congestive) heart failure: Secondary | ICD-10-CM | POA: Diagnosis not present

## 2016-11-25 DIAGNOSIS — I110 Hypertensive heart disease with heart failure: Secondary | ICD-10-CM | POA: Diagnosis not present

## 2016-11-25 DIAGNOSIS — I2510 Atherosclerotic heart disease of native coronary artery without angina pectoris: Secondary | ICD-10-CM | POA: Diagnosis not present

## 2016-11-25 DIAGNOSIS — J14 Pneumonia due to Hemophilus influenzae: Secondary | ICD-10-CM | POA: Diagnosis not present

## 2016-11-25 DIAGNOSIS — J440 Chronic obstructive pulmonary disease with acute lower respiratory infection: Secondary | ICD-10-CM | POA: Diagnosis not present

## 2016-11-27 DIAGNOSIS — I2510 Atherosclerotic heart disease of native coronary artery without angina pectoris: Secondary | ICD-10-CM | POA: Diagnosis not present

## 2016-11-27 DIAGNOSIS — R531 Weakness: Secondary | ICD-10-CM | POA: Diagnosis not present

## 2016-11-27 DIAGNOSIS — I5043 Acute on chronic combined systolic (congestive) and diastolic (congestive) heart failure: Secondary | ICD-10-CM | POA: Diagnosis not present

## 2016-11-27 DIAGNOSIS — I110 Hypertensive heart disease with heart failure: Secondary | ICD-10-CM | POA: Diagnosis not present

## 2016-11-27 DIAGNOSIS — E1151 Type 2 diabetes mellitus with diabetic peripheral angiopathy without gangrene: Secondary | ICD-10-CM | POA: Diagnosis not present

## 2016-11-27 DIAGNOSIS — J14 Pneumonia due to Hemophilus influenzae: Secondary | ICD-10-CM | POA: Diagnosis not present

## 2016-11-27 DIAGNOSIS — J440 Chronic obstructive pulmonary disease with acute lower respiratory infection: Secondary | ICD-10-CM | POA: Diagnosis not present

## 2016-12-01 DIAGNOSIS — J14 Pneumonia due to Hemophilus influenzae: Secondary | ICD-10-CM | POA: Diagnosis not present

## 2016-12-01 DIAGNOSIS — I110 Hypertensive heart disease with heart failure: Secondary | ICD-10-CM | POA: Diagnosis not present

## 2016-12-01 DIAGNOSIS — I5043 Acute on chronic combined systolic (congestive) and diastolic (congestive) heart failure: Secondary | ICD-10-CM | POA: Diagnosis not present

## 2016-12-01 DIAGNOSIS — J440 Chronic obstructive pulmonary disease with acute lower respiratory infection: Secondary | ICD-10-CM | POA: Diagnosis not present

## 2016-12-01 DIAGNOSIS — I2510 Atherosclerotic heart disease of native coronary artery without angina pectoris: Secondary | ICD-10-CM | POA: Diagnosis not present

## 2016-12-01 DIAGNOSIS — E1151 Type 2 diabetes mellitus with diabetic peripheral angiopathy without gangrene: Secondary | ICD-10-CM | POA: Diagnosis not present

## 2016-12-02 DIAGNOSIS — I5043 Acute on chronic combined systolic (congestive) and diastolic (congestive) heart failure: Secondary | ICD-10-CM | POA: Diagnosis not present

## 2016-12-02 DIAGNOSIS — J701 Chronic and other pulmonary manifestations due to radiation: Secondary | ICD-10-CM | POA: Diagnosis not present

## 2016-12-02 DIAGNOSIS — R5381 Other malaise: Secondary | ICD-10-CM | POA: Diagnosis not present

## 2016-12-02 DIAGNOSIS — R531 Weakness: Secondary | ICD-10-CM | POA: Diagnosis not present

## 2016-12-02 DIAGNOSIS — R339 Retention of urine, unspecified: Secondary | ICD-10-CM | POA: Diagnosis not present

## 2016-12-02 DIAGNOSIS — J9611 Chronic respiratory failure with hypoxia: Secondary | ICD-10-CM | POA: Diagnosis not present

## 2016-12-02 DIAGNOSIS — I428 Other cardiomyopathies: Secondary | ICD-10-CM | POA: Diagnosis not present

## 2016-12-02 DIAGNOSIS — R609 Edema, unspecified: Secondary | ICD-10-CM | POA: Diagnosis not present

## 2016-12-02 DIAGNOSIS — I739 Peripheral vascular disease, unspecified: Secondary | ICD-10-CM | POA: Diagnosis not present

## 2016-12-02 DIAGNOSIS — F419 Anxiety disorder, unspecified: Secondary | ICD-10-CM | POA: Diagnosis not present

## 2016-12-02 DIAGNOSIS — I340 Nonrheumatic mitral (valve) insufficiency: Secondary | ICD-10-CM | POA: Diagnosis not present

## 2016-12-02 DIAGNOSIS — I639 Cerebral infarction, unspecified: Secondary | ICD-10-CM | POA: Diagnosis not present

## 2016-12-02 DIAGNOSIS — I110 Hypertensive heart disease with heart failure: Secondary | ICD-10-CM | POA: Diagnosis not present

## 2016-12-02 DIAGNOSIS — R627 Adult failure to thrive: Secondary | ICD-10-CM | POA: Diagnosis not present

## 2016-12-02 DIAGNOSIS — M6281 Muscle weakness (generalized): Secondary | ICD-10-CM | POA: Diagnosis not present

## 2016-12-02 DIAGNOSIS — I5030 Unspecified diastolic (congestive) heart failure: Secondary | ICD-10-CM | POA: Diagnosis not present

## 2016-12-02 DIAGNOSIS — G4700 Insomnia, unspecified: Secondary | ICD-10-CM | POA: Diagnosis not present

## 2016-12-02 DIAGNOSIS — E785 Hyperlipidemia, unspecified: Secondary | ICD-10-CM | POA: Diagnosis not present

## 2016-12-02 DIAGNOSIS — I10 Essential (primary) hypertension: Secondary | ICD-10-CM | POA: Diagnosis not present

## 2016-12-02 DIAGNOSIS — R42 Dizziness and giddiness: Secondary | ICD-10-CM | POA: Diagnosis not present

## 2016-12-02 DIAGNOSIS — I071 Rheumatic tricuspid insufficiency: Secondary | ICD-10-CM | POA: Diagnosis not present

## 2016-12-02 DIAGNOSIS — Z8673 Personal history of transient ischemic attack (TIA), and cerebral infarction without residual deficits: Secondary | ICD-10-CM | POA: Diagnosis not present

## 2016-12-02 DIAGNOSIS — I890 Lymphedema, not elsewhere classified: Secondary | ICD-10-CM | POA: Diagnosis not present

## 2016-12-02 DIAGNOSIS — J449 Chronic obstructive pulmonary disease, unspecified: Secondary | ICD-10-CM | POA: Diagnosis not present

## 2016-12-02 DIAGNOSIS — I2510 Atherosclerotic heart disease of native coronary artery without angina pectoris: Secondary | ICD-10-CM | POA: Diagnosis not present

## 2016-12-02 DIAGNOSIS — Z79899 Other long term (current) drug therapy: Secondary | ICD-10-CM | POA: Diagnosis not present

## 2016-12-02 DIAGNOSIS — I6523 Occlusion and stenosis of bilateral carotid arteries: Secondary | ICD-10-CM | POA: Diagnosis not present

## 2016-12-02 DIAGNOSIS — I351 Nonrheumatic aortic (valve) insufficiency: Secondary | ICD-10-CM | POA: Diagnosis not present

## 2016-12-02 DIAGNOSIS — R2681 Unsteadiness on feet: Secondary | ICD-10-CM | POA: Diagnosis not present

## 2016-12-02 DIAGNOSIS — R5383 Other fatigue: Secondary | ICD-10-CM | POA: Diagnosis not present

## 2016-12-02 DIAGNOSIS — Z9013 Acquired absence of bilateral breasts and nipples: Secondary | ICD-10-CM | POA: Diagnosis not present

## 2016-12-02 DIAGNOSIS — I272 Pulmonary hypertension, unspecified: Secondary | ICD-10-CM | POA: Diagnosis not present

## 2016-12-02 DIAGNOSIS — R0602 Shortness of breath: Secondary | ICD-10-CM | POA: Diagnosis not present

## 2016-12-02 DIAGNOSIS — G9340 Encephalopathy, unspecified: Secondary | ICD-10-CM | POA: Diagnosis not present

## 2016-12-02 DIAGNOSIS — K219 Gastro-esophageal reflux disease without esophagitis: Secondary | ICD-10-CM | POA: Diagnosis not present

## 2016-12-02 DIAGNOSIS — F339 Major depressive disorder, recurrent, unspecified: Secondary | ICD-10-CM | POA: Diagnosis not present

## 2016-12-02 DIAGNOSIS — N390 Urinary tract infection, site not specified: Secondary | ICD-10-CM | POA: Diagnosis not present

## 2016-12-02 DIAGNOSIS — I371 Nonrheumatic pulmonary valve insufficiency: Secondary | ICD-10-CM | POA: Diagnosis not present

## 2016-12-02 DIAGNOSIS — E119 Type 2 diabetes mellitus without complications: Secondary | ICD-10-CM | POA: Diagnosis not present

## 2016-12-02 DIAGNOSIS — F329 Major depressive disorder, single episode, unspecified: Secondary | ICD-10-CM | POA: Diagnosis not present

## 2016-12-08 ENCOUNTER — Encounter: Payer: Self-pay | Admitting: Cardiology

## 2016-12-08 ENCOUNTER — Ambulatory Visit (INDEPENDENT_AMBULATORY_CARE_PROVIDER_SITE_OTHER): Payer: Medicare Other | Admitting: Cardiology

## 2016-12-08 VITALS — BP 153/81 | HR 52 | Ht 63.0 in | Wt 145.0 lb

## 2016-12-08 DIAGNOSIS — J9611 Chronic respiratory failure with hypoxia: Secondary | ICD-10-CM | POA: Diagnosis not present

## 2016-12-08 DIAGNOSIS — Z8673 Personal history of transient ischemic attack (TIA), and cerebral infarction without residual deficits: Secondary | ICD-10-CM

## 2016-12-08 DIAGNOSIS — I6523 Occlusion and stenosis of bilateral carotid arteries: Secondary | ICD-10-CM | POA: Diagnosis not present

## 2016-12-08 DIAGNOSIS — I428 Other cardiomyopathies: Secondary | ICD-10-CM | POA: Diagnosis not present

## 2016-12-08 DIAGNOSIS — I5043 Acute on chronic combined systolic (congestive) and diastolic (congestive) heart failure: Secondary | ICD-10-CM

## 2016-12-08 DIAGNOSIS — J701 Chronic and other pulmonary manifestations due to radiation: Secondary | ICD-10-CM

## 2016-12-08 DIAGNOSIS — Z79899 Other long term (current) drug therapy: Secondary | ICD-10-CM | POA: Diagnosis not present

## 2016-12-08 DIAGNOSIS — I10 Essential (primary) hypertension: Secondary | ICD-10-CM | POA: Diagnosis not present

## 2016-12-08 LAB — BASIC METABOLIC PANEL
BUN: 14 mg/dL (ref 7–25)
CO2: 37 mmol/L — ABNORMAL HIGH (ref 20–31)
Calcium: 8.5 mg/dL — ABNORMAL LOW (ref 8.6–10.4)
Chloride: 91 mmol/L — ABNORMAL LOW (ref 98–110)
Creat: 0.85 mg/dL (ref 0.60–0.88)
Glucose, Bld: 113 mg/dL — ABNORMAL HIGH (ref 65–99)
Potassium: 3.4 mmol/L — ABNORMAL LOW (ref 3.5–5.3)
Sodium: 137 mmol/L (ref 135–146)

## 2016-12-08 NOTE — Assessment & Plan Note (Signed)
COPD- on O2 for a year now

## 2016-12-08 NOTE — Assessment & Plan Note (Signed)
Pt admitted with mixed respiratory failure suspected to be CHF/ COPD

## 2016-12-08 NOTE — Progress Notes (Signed)
12/08/2016 Helen [name]   21-Dec-1929  [id number]  Primary Physician Anthoney Harada, MD Primary Cardiologist: Dr Oval Linsey  HPI:  83 y/o female, seen by cardiology initially in Feb 2018 for chest pain and SOB 10/08/16. She has multiple medical problems including heart failure, hypertension, hyperlipidemia, diabetes, prior stroke, carotid stenosis and breast cancer s/p chemotherapy and double mastectomy, COPD and chronic hypoxia-on O2 for a year now. She was admitted 02/2016 with hyponatremia felt to be from overdiuresis. During that hospitalization she had an echo that revealed LVEF LVEF 40-45% with global hypokinesis worse in the basal-mid anteroseptal and inferoseptal regions, moderately elevated pulmonary pressures, and grade 2 diastolic dysfunction.  She was not evaluated by cardiology at the time.  Since then she has remained on supplemental oxygen. Dr Oval Linsey saw her in Feb and ordered an echo and a Myoview. The Myoview was done but not the echo. She ended up being admitted with acute on chronic respiratory failure 11/11/16. She was given a diagnosis of CHF then with a BNP of 1331. Again, not seen by cardiology.   She is in the office today for follow up with her daughter. She is now in [facility]. Her daughter is concerned she has CHF secondary to Rt edema but I think that is just lymphedema, The pt has no LE edema, no new orthopnea. Overall she is stable, she is in a wheelchair on O2.   Current Outpatient Prescriptions  Medication Sig Dispense Refill  . atorvastatin (LIPITOR) 80 MG tablet Take 80 mg by mouth at bedtime.    [name] BIOTIN PO Take 1 tablet by mouth daily.    . busPIRone (BUSPAR) 5 MG tablet Take 1 tablet (5 mg total) by mouth 2 (two) times daily. 60 tablet 2  . carvedilol (COREG) 12.5 MG tablet Take 1 tablet (12.5 mg total) by mouth 2 (two) times daily with a meal. 60 tablet 0  . cetirizine (ZYRTEC) 10 MG tablet Take 10 mg by mouth at bedtime.    .  cholecalciferol (VITAMIN D) 1000 units tablet Take 1,000 Units by mouth every evening.     . clopidogrel (PLAVIX) 75 MG tablet Take 75 mg by mouth daily.    . fluticasone (FLONASE) 50 MCG/ACT nasal spray Place 1 spray into both nostrils daily as needed for allergies or rhinitis.     [name] FOLIC ACID PO Take 1 tablet by mouth daily.    . furosemide (LASIX) 20 MG tablet Take 20 mg by mouth daily.     [name] gabapentin (NEURONTIN) 100 MG capsule Take 100 mg by mouth 2 (two) times daily.    [name] guaiFENesin (MUCINEX) 600 MG 12 hr tablet Take 1 tablet (600 mg total) by mouth 2 (two) times daily. 30 tablet 0  . lisinopril (ZESTRIL) 2.5 MG tablet Take 1 tablet (2.5 mg total) by mouth daily. 30 tablet 0  . meclizine (ANTIVERT) 25 MG tablet Take 1 tablet (25 mg total) by mouth 3 (three) times daily as needed for dizziness. 90 tablet 0  . Melatonin 5 MG TABS Take 10 mg by mouth at bedtime as needed (sleep).    . Multiple Vitamin (MULTIVITAMIN WITH MINERALS) TABS tablet Take 1 tablet by mouth daily.     [name] omeprazole (PRILOSEC) 20 MG capsule Take 20 mg by mouth daily.    . polyethylene glycol (MIRALAX / GLYCOLAX) packet Take 17 g by mouth daily. 14 each 0  . senna-docusate (SENOKOT-S) 8.6-50 MG tablet Take 1 tablet by mouth at  bedtime. 30 tablet 0  . vitamin C (ASCORBIC ACID) 500 MG tablet Take 1,000 mg by mouth daily.     No current facility-administered medications for this visit.     Allergies  Allergen Reactions  . Morphine And Related Nausea And Vomiting  . Other Other (See Comments)    Barley & Carrots: learned through allergy testing. Unknown reaction-- MD said not very allergic but not to eat large quantities  . Sulfa Antibiotics Hives  . Nickel Rash    Past Medical History:  Diagnosis Date  . Anemia   . Cancer [facility])    Breast cancer  . Diabetes mellitus   . History of stroke 11/2016   incidental finding on MRI  . Hypercholesteremia   . Hypertension   . Peripheral vascular disease ([place]) 2015    bilateral moderate CA disease-Dr Bridgett Larsson    Social History   Social History  . Marital status: Widowed    Spouse name: N/A  . Number of children: N/A  . Years of education: N/A   Occupational History  . Not on file.   Social History Main Topics  . Smoking status: Former Smoker    Quit date: 08/24/1978  . Smokeless tobacco: Never Used  . Alcohol use No  . Drug use: No  . Sexual activity: Not on file   Other Topics Concern  . Not on file   Social History Narrative  . No narrative on file     Family History  Problem Relation Age of Onset  . Cancer Mother   . Pulmonary embolism Mother   . Heart disease Father   . Heart attack Father   . Stroke Father   . Diabetes Brother   . Hyperlipidemia Brother   . Stroke Brother   . Kidney disease Brother      Review of Systems: General: negative for chills, fever, night sweats or weight changes.  Cardiovascular: negative for chest pain, dyspnea on exertion, edema, orthopnea, palpitations, paroxysmal nocturnal dyspnea or shortness of breath Dermatological: negative for rash Respiratory: negative for cough or wheezing Urologic: negative for hematuria Abdominal: negative for nausea, vomiting, diarrhea, bright red blood per rectum, melena, or hematemesis Neurologic: negative for visual changes, syncope, or dizziness All other systems reviewed and are otherwise negative except as noted above.    Blood pressure (!) 153/81, pulse (!) 52, height 5\' 3"  (1.6 m), weight 145 lb (65.8 kg).  General appearance: alert, cooperative, no distress and pale Neck: no JVD Lungs: coarse lung sounds on Rt, clear on Lt Heart: regular rate and rhythm Extremities: RUE soft edema Skin: pale cool dry Neurologic: Grossly normal    ASSESSMENT AND PLAN:   Acute on chronic combined systolic and diastolic CHF (congestive heart failure) ([place]) Pt admitted with mixed respiratory failure suspected to be CHF/ COPD  Carotid stenosis Moderate CA disease-  Dr Bridgett Larsson saw July 2016  Nonischemic cardiomyopathy ([place]) EF 40-45%-global HK, grade 2 DD  Essential hypertension Controlled  Scarring of lung following radiation- RLL COPD- on O2 for a year now  Chronic respiratory failure with hypoxia ([place]) Chronic O2   PLAN  I will schedule her echo. I did not change her diuretics, she doesn't appear to be volume overloaded to me and she has had previous hyponatremia from overdiuresis. I reassured them that her arm swelling was not from heart failure. I did order a BMP and a BNP. F/U with Dr Lalla Brothers [facility] PA-C 12/08/2016 4:31 PM

## 2016-12-08 NOTE — Assessment & Plan Note (Signed)
EF 40-45%-global HK, grade 2 DD

## 2016-12-08 NOTE — Assessment & Plan Note (Signed)
Chronic O2 

## 2016-12-08 NOTE — Assessment & Plan Note (Signed)
Controlled.  

## 2016-12-08 NOTE — Patient Instructions (Signed)
Medication Instructions: no change   Labwork:  BNP, BMET today   Testing/Procedures: Your physician has requested that you have an echocardiogram. Echocardiography is a painless test that uses sound waves to create images of your heart. It provides your doctor with information about the size and shape of your heart and how well your heart's chambers and valves are working. This procedure takes approximately one hour. There are no restrictions for this procedure.    Follow-Up: 3 months with Dr. Oval Linsey    If you need a refill on your cardiac medications before your next appointment, please call your pharmacy.

## 2016-12-08 NOTE — Assessment & Plan Note (Signed)
Moderate CA disease- Dr Bridgett Larsson saw July 2016

## 2016-12-09 ENCOUNTER — Telehealth: Payer: Self-pay | Admitting: *Deleted

## 2016-12-09 LAB — BRAIN NATRIURETIC PEPTIDE: Brain Natriuretic Peptide: 2247.2 pg/mL — ABNORMAL HIGH (ref <100)

## 2016-12-09 NOTE — Telephone Encounter (Signed)
-----   Message from Erlene Quan, [place] sent at 12/09/2016  4:25 PM EDT ----- The pt needs to increase her Lasix to 40 mg daily and eat a banana a day, we'll be in touch after her echo.   [name] PA-C 12/09/2016 4:24 PM
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
## 2016-12-09 NOTE — Telephone Encounter (Signed)
Called daughter [name] to discuss ( patient not available at home )  She voiced understanding of recommendations.  Requested I call [facility] to communicate to nursing staff at facility, as patient is currently staying there.  I called their number at [phone]. Spoke to receptionist Dyann Ruddle who put me through to nursing station. Was not able to reach a staff member to discuss care. Phone rang for several minutes without pickup. I called back and Dyann Ruddle paged RN.  Unfortunately still no pick up - I have obtained fax number for this facility and have faxed Helen [name]'s result notes w request to callback to confirm receipt.

## 2016-12-14 DIAGNOSIS — M6281 Muscle weakness (generalized): Secondary | ICD-10-CM | POA: Diagnosis not present

## 2016-12-14 DIAGNOSIS — R0602 Shortness of breath: Secondary | ICD-10-CM | POA: Diagnosis not present

## 2016-12-14 DIAGNOSIS — R5381 Other malaise: Secondary | ICD-10-CM | POA: Diagnosis not present

## 2016-12-14 DIAGNOSIS — R42 Dizziness and giddiness: Secondary | ICD-10-CM | POA: Diagnosis not present

## 2016-12-17 DIAGNOSIS — F339 Major depressive disorder, recurrent, unspecified: Secondary | ICD-10-CM | POA: Diagnosis not present

## 2016-12-17 DIAGNOSIS — R5381 Other malaise: Secondary | ICD-10-CM | POA: Diagnosis not present

## 2016-12-17 DIAGNOSIS — M6281 Muscle weakness (generalized): Secondary | ICD-10-CM | POA: Diagnosis not present

## 2016-12-18 DIAGNOSIS — F339 Major depressive disorder, recurrent, unspecified: Secondary | ICD-10-CM | POA: Diagnosis not present

## 2016-12-18 DIAGNOSIS — R5383 Other fatigue: Secondary | ICD-10-CM | POA: Diagnosis not present

## 2016-12-18 DIAGNOSIS — M6281 Muscle weakness (generalized): Secondary | ICD-10-CM | POA: Diagnosis not present

## 2016-12-21 ENCOUNTER — Ambulatory Visit (HOSPITAL_COMMUNITY): Payer: No Typology Code available for payment source | Attending: Cardiology

## 2016-12-21 ENCOUNTER — Other Ambulatory Visit: Payer: Self-pay

## 2016-12-21 DIAGNOSIS — I5043 Acute on chronic combined systolic (congestive) and diastolic (congestive) heart failure: Secondary | ICD-10-CM

## 2016-12-21 DIAGNOSIS — I110 Hypertensive heart disease with heart failure: Secondary | ICD-10-CM | POA: Diagnosis not present

## 2016-12-21 DIAGNOSIS — J449 Chronic obstructive pulmonary disease, unspecified: Secondary | ICD-10-CM | POA: Diagnosis not present

## 2016-12-21 DIAGNOSIS — I340 Nonrheumatic mitral (valve) insufficiency: Secondary | ICD-10-CM | POA: Insufficient documentation

## 2016-12-21 DIAGNOSIS — I371 Nonrheumatic pulmonary valve insufficiency: Secondary | ICD-10-CM | POA: Insufficient documentation

## 2016-12-21 DIAGNOSIS — I351 Nonrheumatic aortic (valve) insufficiency: Secondary | ICD-10-CM | POA: Insufficient documentation

## 2016-12-21 DIAGNOSIS — I071 Rheumatic tricuspid insufficiency: Secondary | ICD-10-CM | POA: Insufficient documentation

## 2016-12-21 DIAGNOSIS — E119 Type 2 diabetes mellitus without complications: Secondary | ICD-10-CM | POA: Insufficient documentation

## 2016-12-21 DIAGNOSIS — E785 Hyperlipidemia, unspecified: Secondary | ICD-10-CM | POA: Diagnosis not present

## 2016-12-23 ENCOUNTER — Telehealth: Payer: Self-pay | Admitting: Cardiovascular Disease

## 2016-12-23 MED ORDER — LOSARTAN POTASSIUM 25 MG PO TABS: 25.0000 mg | ORAL_TABLET | Freq: Every day | ORAL | 3 refills | Status: DC

## 2016-12-23 MED ORDER — FUROSEMIDE 40 MG PO TABS: 40.0000 mg | ORAL_TABLET | Freq: Every day | ORAL | 3 refills | Status: DC

## 2016-12-23 NOTE — Telephone Encounter (Signed)
New Message   pt daughter verbalized that she is returning call calling for rn

## 2016-12-23 NOTE — Telephone Encounter (Signed)
Advised daughter of medication changes. Patient scheduled to see Dr Oval Linsey 01/01/17 Will forward to Ovid Curd RN so he can have Lurena Joiner sign changes and fax to facility   Notes recorded by Erlene Quan, PA-C on 12/22/2016 at 1:49 PM EDT Please let pt's daughter know the pt has a weak heart, her Lasix should be increased to 40 mg daily. We should also change her Zestril to Cozaar 25 mg in preparation for transition to [employer]. See if the July appointment with Dr Oval Linsey can be moved up to sometime in the next 3 weeks.

## 2016-12-23 NOTE — Telephone Encounter (Signed)
Luke signed Rx  Spoke to [place] at [facility], who took verbal orders for changes. I also faxed signed Rx and documentation of change recommendations to her at fax [phone]

## 2016-12-25 DIAGNOSIS — M6281 Muscle weakness (generalized): Secondary | ICD-10-CM | POA: Diagnosis not present

## 2016-12-25 DIAGNOSIS — R627 Adult failure to thrive: Secondary | ICD-10-CM | POA: Diagnosis not present

## 2016-12-25 DIAGNOSIS — I5030 Unspecified diastolic (congestive) heart failure: Secondary | ICD-10-CM | POA: Diagnosis not present

## 2016-12-31 DIAGNOSIS — R531 Weakness: Secondary | ICD-10-CM | POA: Diagnosis not present

## 2016-12-31 DIAGNOSIS — G4700 Insomnia, unspecified: Secondary | ICD-10-CM | POA: Diagnosis not present

## 2016-12-31 DIAGNOSIS — F329 Major depressive disorder, single episode, unspecified: Secondary | ICD-10-CM | POA: Diagnosis not present

## 2016-12-31 DIAGNOSIS — F419 Anxiety disorder, unspecified: Secondary | ICD-10-CM | POA: Diagnosis not present

## 2017-01-01 ENCOUNTER — Ambulatory Visit (INDEPENDENT_AMBULATORY_CARE_PROVIDER_SITE_OTHER): Payer: Medicare Other | Admitting: Cardiovascular Disease

## 2017-01-01 ENCOUNTER — Encounter: Payer: Self-pay | Admitting: Cardiovascular Disease

## 2017-01-01 VITALS — BP 104/46 | HR 72 | Ht 63.0 in | Wt 136.8 lb

## 2017-01-01 DIAGNOSIS — I110 Hypertensive heart disease with heart failure: Secondary | ICD-10-CM | POA: Diagnosis not present

## 2017-01-01 DIAGNOSIS — I5043 Acute on chronic combined systolic (congestive) and diastolic (congestive) heart failure: Secondary | ICD-10-CM | POA: Diagnosis not present

## 2017-01-01 DIAGNOSIS — I6523 Occlusion and stenosis of bilateral carotid arteries: Secondary | ICD-10-CM

## 2017-01-01 NOTE — Progress Notes (Signed)
Cardiology Office Note   Date:  01/01/2017   ID:  Helen [name], DOB 11-09-1929, MRN [id number]  PCP:  Vernie Shanks, MD  Cardiologist:   Skeet Latch, MD   Chief Complaint  Patient presents with  . Follow-up    pt c/o nausea and dizziness     History of Present Illness: Helen [name] is a 83 y.o. female with chronic systolic and diastolic heart failure, hypertension, hyperlipidemia, diabetes, prior stroke, CAD, carotid stenosis and breast cancer s/p chemotherapy and double mastectomy who presents for follow up on heart failure.  Helen [name] was admitted 02/2016 with hyponatremia.  That hospitalization she had an echo that revealed LVEF LVEF 40-45% with global hypokinesis worse in the basal-mid anteroseptal and inferoseptal regions, moderately elevated pulmonary pressures, and grade 2 diastolic dysfunction.  She was not evaluated by cardiology at the time.  Since then she has remained on supplemental oxygen.  BNP was noted to be 1500, So she was referred to cardiology for evaluation 10/08/16. EKG was notable for diffuse T wave inversions. She was referred for [facility] 10/16/16 that revealed LVEF 30% with a fixed defect in the inferior wall felt to be diaphragmatic attenuation.  She was admitted 10/2016 with heart failure and acute on chronic respiratory failure. BNP was 1300. Cardiology was not consulted. She followed up with [name], PA-C on 12/08/16 and was referred for repeat echocardiogram. Echo revealed LVEF 25-30% with diffuse hypokinesis and elevated left ventricular filling pressures. She also had moderate pulmonary hypertension.  Since leaving the hospital Helen [name] has remained at rehabilitation. She does physical therapy for one hour twice each day. She has no chest pain or shortness of breath with this activity. She does note some shortness of breath when she tries to rupture her too fast. She denies orthopnea but sleeps with the head of her bed elevated due to  GERD. For the last several days she has noticed increased lower extremity edema in bilateral legs.  At her last appointment lisinopril was switched to losartan. She reports that her blood pressure has been well-hydrated controlled.   Past Medical History:  Diagnosis Date  . Anemia   . Cancer [facility])    Breast cancer  . Diabetes mellitus   . History of stroke 11/2016   incidental finding on MRI  . Hypercholesteremia   . Hypertension   . Peripheral vascular disease ([place]) 2015   bilateral moderate CA disease-Dr Bridgett Larsson    Past Surgical History:  Procedure Laterality Date  . ABDOMINAL HYSTERECTOMY    . APPENDECTOMY  1951  . [place]  . CHOLECYSTECTOMY  2003   By Dr. Excell Seltzer  . EYE SURGERY Bilateral 2009   Cataract surgery  . FRACTURE SURGERY Right    ORIF   by Dr. Burney Gauze  . HUMERUS FRACTURE SURGERY    . MASTECTOMY Bilateral   . NASAL SEPTUM SURGERY  1977   Dr. Ernesto Rutherford  . TOENAIL EXCISION       Current Outpatient Prescriptions  Medication Sig Dispense Refill  . atorvastatin (LIPITOR) 80 MG tablet Take 80 mg by mouth at bedtime.    [name] BIOTIN PO Take 1 tablet by mouth daily.    . busPIRone (BUSPAR) 5 MG tablet Take 1 tablet (5 mg total) by mouth 2 (two) times daily. 60 tablet 2  . carvedilol (COREG) 6.25 MG tablet Take 6.25 mg by mouth 2 (two) times daily with a meal.    . cetirizine (ZYRTEC)  10 MG tablet Take 10 mg by mouth at bedtime.    . cholecalciferol (VITAMIN D) 1000 units tablet Take 1,000 Units by mouth every evening.     . clopidogrel (PLAVIX) 75 MG tablet Take 75 mg by mouth daily.    . fluticasone (FLONASE) 50 MCG/ACT nasal spray Place 1 spray into both nostrils daily as needed for allergies or rhinitis.     [name] FOLIC ACID PO Take 1 tablet by mouth daily.    . furosemide (LASIX) 40 MG tablet Take 1 tablet (40 mg total) by mouth daily. 30 tablet 3  . gabapentin (NEURONTIN) 100 MG capsule Take 100 mg by mouth 2 (two) times daily.    [name] guaiFENesin  (MUCINEX) 600 MG 12 hr tablet Take 1 tablet (600 mg total) by mouth 2 (two) times daily. 30 tablet 0  . meclizine (ANTIVERT) 25 MG tablet Take 1 tablet (25 mg total) by mouth 3 (three) times daily as needed for dizziness. 90 tablet 0  . Melatonin 5 MG TABS Take 10 mg by mouth at bedtime as needed (sleep).    . Multiple Vitamin (MULTIVITAMIN WITH MINERALS) TABS tablet Take 1 tablet by mouth daily.     [name] omeprazole (PRILOSEC) 20 MG capsule Take 20 mg by mouth daily.    . polyethylene glycol (MIRALAX / GLYCOLAX) packet Take 17 g by mouth daily. 14 each 0  . sacubitril-valsartan (ENTRESTO) 24-26 MG Take 1 tablet by mouth 2 (two) times daily.    [name] senna-docusate (SENOKOT-S) 8.6-50 MG tablet Take 1 tablet by mouth at bedtime. 30 tablet 0  . vitamin C (ASCORBIC ACID) 500 MG tablet Take 1,000 mg by mouth daily.     No current facility-administered medications for this visit.     Allergies:   Morphine and related; Other; Sulfa antibiotics; and Nickel    Social History:  The patient  reports that she quit smoking about 38 years ago. She has never used smokeless tobacco. She reports that she does not drink alcohol or use drugs.   Family History:  The patient's family history includes Cancer in her mother; Diabetes in her brother; Heart attack in her father; Heart disease in her father; Hyperlipidemia in her brother; Kidney disease in her brother; Pulmonary embolism in her mother; Stroke in her brother and father.    ROS:  Please see the history of present illness.   Otherwise, review of systems are positive for none.   All other systems are reviewed and negative.    PHYSICAL EXAM: VS:  BP (!) 104/46 (BP Location: Left Arm, Patient Position: Sitting, Cuff Size: Normal)   Pulse 72   Ht 5\' 3"  (1.6 m)   Wt 62.1 kg (136 lb 12.8 oz)   BMI 24.23 kg/m  , BMI Body mass index is 24.23 kg/m. GENERAL:  Well appearing.  No acute distress. HEENT: JVP 2 cm above the clavicle sitting upright. waveform within  normal limits, carotid upstroke brisk and symmetric, no bruits, no thyromegaly LUNGS:  Clear to auscultation bilaterally.  No crackles, wheezes, rhonchi. HEART:  RRR.  PMI not displaced or sustained,S1 and S2 within normal limits, no S3, no S4, no clicks, no rubs, no murmurs ABD:  Flat, positive bowel sounds normal in frequency in pitch, no bruits, no rebound, no guarding, no midline pulsatile mass, no hepatomegaly, no splenomegaly EXT:  2 plus pulses throughout, 2+ pitting edema to the mid tibia bilaterally, no cyanosis no clubbing SKIN:  No rashes no nodules NEURO:  Cranial nerves  II through XII grossly intact, motor grossly intact throughout [facility]:  Cognitively intact, oriented to person place and time  EKG:  EKG is not ordered today. The ekg ordered 10/08/16 demonstrates sinus rhythm rate 76 bpm.  PVCs.  Inferolateral TWI  12/21/16: Study Conclusions  - Left ventricle: The cavity size was normal. Wall thickness was   increased in a pattern of mild LVH. Systolic function was   severely reduced. The estimated ejection fraction was in the   range of 25% to 30%. Diffuse hypokinesis. Doppler parameters are   consistent with restrictive physiology, indicative of decreased   left ventricular diastolic compliance and/or increased left   atrial pressure. Doppler parameters are consistent with high   ventricular filling pressure. - Aortic valve: Valve mobility was restricted. There was trivial   regurgitation. - Mitral valve: Calcified annulus. Mildly thickened leaflets .   There was mild regurgitation. - Left atrium: The atrium was mildly dilated. - Right ventricle: The cavity size was mildly dilated. Systolic   function was mildly reduced. - Right atrium: The atrium was mildly dilated. - Pulmonary arteries: Systolic pressure was moderately increased.   PA peak pressure: 53 mm Hg (S). - Pericardium, extracardiac: A trivial pericardial effusion was   identified.  Carotid Doppler 12/15/15  and 11/06/16:  R and L ICA 1-39%.  L ECA >50%  Lexiscan Myoview 10/16/16:  The left ventricular ejection fraction is moderately decreased (30-44%).  Nuclear stress EF: 30%.  There is a medium defect of moderate severity present in the basal inferior, mid inferior and apical inferior location. The defect is non-reversible and likely secondary to diaphragmatic attenuation artifact and extracardiac tracer uptake as there is no focal wall motion abnormality in this area. No ischemia noted.  This is an intermediate risk study due to LV dysfunction.  There was downsloping ST segment depression in the inferolateral leads at baseline with no change during infusion.  Recent Labs: 11/17/2016: ALT 100; Hemoglobin 11.2; Magnesium 2.1; Platelets 203; TSH 4.721 12/08/2016: Brain Natriuretic Peptide 2,247.2; BUN 14; Creat 0.85; Potassium 3.4; Sodium 137   09/07/16:  BNP 1500 Sodium 137, potassium 4.4, BUN 13, creatinine 0.78 AST 19, ALT 25 WBC 10.1, hemoglobin 13.1, hematocrit 39.7, platelets 240   Lipid Panel    Component Value Date/Time   CHOL 84 11/16/2016 0521   TRIG 91 11/16/2016 0521   HDL 18 (L) 11/16/2016 0521   CHOLHDL 4.7 11/16/2016 0521   VLDL 18 11/16/2016 0521   LDLCALC 48 11/16/2016 0521      Wt Readings from Last 3 Encounters:  01/01/17 62.1 kg (136 lb 12.8 oz)  12/08/16 65.8 kg (145 lb)  11/16/16 64.2 kg (141 lb 9.6 oz)      ASSESSMENT AND PLAN:  # Acute on chronic systolic and diastolic heart failure:  # Hypertension:  LVEF reduced to 25-30%.  No ischemia on Lexiscan Myoview.  Stop losartan and start Entresto 24/26 mg bid.  Stop losartan.  Reduce carvedilol to 6.25mg  bid.  If ededma doesn't improve with starting Entresto we will need to increase her lasix.  Repeat BMP at follow up.  # Carotid stenosis: # Prior TIA:  Helen [name] has moderate carotid stenosis.  Continue clopidogrel and atorvastatin.  LDL 48 10/2016.    Current medicines are reviewed at length with the  patient today.  The patient does not have concerns regarding medicines.  The following changes have been made:  Stop atenolol.  Start carvedilol and ramipril  Labs/ tests ordered today include:  No orders of the defined types were placed in this encounter.    Disposition:   FU with Helen Boutelle C. Oval Linsey, MD, [facility] in 2 months.  APP in 2 weeks.    This note was written with the assistance of speech recognition software.  Please excuse any transcriptional errors.  Signed, Kaemon Barnett C. Oval Linsey, MD, [facility]  01/01/2017 1:33 PM    [facility]

## 2017-01-01 NOTE — Patient Instructions (Addendum)
Medication Instructions:  STOP LOSARTAN  DECREASE YOUR CARVEDILOL TO 6.25 MG TWICE A DAY  START ENTRESTO 24-26 MG TWICE A DAY   Labwork: NONE  Testing/Procedures: NONE  Follow-Up: Your physician recommends that you schedule a follow-up appointment in: Helen [name]  Your physician recommends that you schedule a follow-up appointment in: 2 MONTHS WITH DR [facility]   If you need a refill on your cardiac medications before your next appointment, please call your pharmacy.

## 2017-01-04 ENCOUNTER — Telehealth: Payer: Self-pay | Admitting: Cardiovascular Disease

## 2017-01-04 NOTE — Telephone Encounter (Signed)
Lot [id number] Exp Nov 2018 #2 left at front desk, advised daughter

## 2017-01-04 NOTE — Telephone Encounter (Signed)
New message    Pt daughter is calling. She states her mother was supposed to get samples when they were here last week and did not get them.   Patient calling the office for samples of medication:   1.  What medication and dosage are you requesting samples for? Entresto   2.  Are you currently out of this medication? Yes, pt does not have any.

## 2017-01-05 DIAGNOSIS — I739 Peripheral vascular disease, unspecified: Secondary | ICD-10-CM | POA: Diagnosis not present

## 2017-01-05 DIAGNOSIS — I10 Essential (primary) hypertension: Secondary | ICD-10-CM | POA: Diagnosis not present

## 2017-01-05 DIAGNOSIS — E119 Type 2 diabetes mellitus without complications: Secondary | ICD-10-CM | POA: Diagnosis not present

## 2017-01-05 DIAGNOSIS — I5030 Unspecified diastolic (congestive) heart failure: Secondary | ICD-10-CM | POA: Diagnosis not present

## 2017-01-06 DIAGNOSIS — I110 Hypertensive heart disease with heart failure: Secondary | ICD-10-CM | POA: Diagnosis not present

## 2017-01-06 DIAGNOSIS — J440 Chronic obstructive pulmonary disease with acute lower respiratory infection: Secondary | ICD-10-CM | POA: Diagnosis not present

## 2017-01-06 DIAGNOSIS — I5043 Acute on chronic combined systolic (congestive) and diastolic (congestive) heart failure: Secondary | ICD-10-CM | POA: Diagnosis not present

## 2017-01-06 DIAGNOSIS — E1151 Type 2 diabetes mellitus with diabetic peripheral angiopathy without gangrene: Secondary | ICD-10-CM | POA: Diagnosis not present

## 2017-01-06 DIAGNOSIS — J14 Pneumonia due to Hemophilus influenzae: Secondary | ICD-10-CM | POA: Diagnosis not present

## 2017-01-06 DIAGNOSIS — I2510 Atherosclerotic heart disease of native coronary artery without angina pectoris: Secondary | ICD-10-CM | POA: Diagnosis not present

## 2017-01-07 ENCOUNTER — Telehealth: Payer: Self-pay | Admitting: Cardiovascular Disease

## 2017-01-07 NOTE — Telephone Encounter (Signed)
Spoke w ms Volanda Napoleon. We had pleasant discussion. She has CBGs that run in the 130s to 150s from time to time. She's had concern that she might need to pursue low glucose diet - we discussed her concerns at length but I advised her PCP would be best resource for this - perhaps pursue a request for endocrinology referral from them if she is dissatisfied with primary care recommendations. - ultimately she voiced that she will call PCP to discuss options further. She voiced thanks for my time.

## 2017-01-07 NOTE — Progress Notes (Signed)
[name]    HEMATOLOGY/ONCOLOGY CLINIC NOTE  Date of Service: .06/01/2016  Patient Care Team: Vernie Shanks, MD as PCP - General (Family Medicine) Vernie Shanks, MD as Consulting Physician (Family Medicine) Jerrell Belfast, MD as Consulting Physician (Otolaryngology) Calvert Cantor, MD as Consulting Physician (Ophthalmology) Ronald Lobo, MD as Consulting Physician (Gastroenterology)  CHIEF COMPLAINTS/PURPOSE OF CONSULTATION:  IgG kappa MGUS Anemia  HISTORY OF PRESENTING ILLNESS:   Helen [name] is a wonderful 83 y.o. female with a fantastic sense of humor who has been referred to [place] by Dr .Jacelyn Grip, Edwyna Shell, MD for evaluation and management of newly noted IgG kappa monoclonal gammopathy and Anemia.  Patient has a history of multiple medical comorbidities including hypertension, dyslipidemia, coronary artery disease, peripheral vascular disease, TIAs and has had hospitalization in April 2017 for altered mental status which was thought to be due to an acute toxic metabolic encephalopathy from hyponatremia/urinary tract infection with possibility of a TIA.  MRI showed no acute stroke.  Hyponatremia was thought to be related to SIADH from Zoloft and Depakote.  Cortisol levels and TSH were within normal limits.  Chest x-ray was negative for pneumonia. Patient was also subsequently hospitalized again in July 2017 for dizziness nausea and decreased sodium of 122.  Labs are suggestive of dehydration.  Sodium levels improved with oral salt replacement and Lasix and fluid restriction.  She was also noted to have chronic systolic and diastolic CHF with ejection fraction of 40% with grade 2 diastolic dysfunction and moderate pulmonary hypertension which was apparently new to her.  This time she was also noted to have anemia with hemoglobin drop from about 12 down to 9.5.  Anemia workup at the time showed a ferritin level of 331, normal B12 and folate, iron saturation of 10% suggestive of anemia  chronic disease.  Patient subsequently followed with her primary care physician Dr. Kathyrn Lass and have labs on 04/03/2016 that showed some improvement in her hemoglobin to 10.7 with an MCV of 85 the Lisa count of 7.9k and platelets of 408k.  She has an SPEP and UPEP done presumably as a part of her anemia workup.  SPEP showed no observable M spike.  Random UPEP done showed no evidence of M spike with minimal urinary total protein.  The patient's immunofixation electrophoresis -apparently showed faint IgG kappa monoclonal protein. Sedimentation rate was within normal limits at 16 and ferritin was 123.5  This is resulted in her current referral for further evaluation of MGUS.  Patient reports a history of breast cancer.  She was was diagnosed at age 68 with stage III right-sided breast cancer in 1988 and was treated with right sided mastectomy with lymph node dissection.  She reports 3-4 lymph nodes were positive.  She received adjuvant chemotherapy including Adriamycin and 5-fluorouracil per her report and breast and axillary radiation.  She subsequently was noted to have left-sided breast cancer stage I and had a mastectomy with radiation.  Patient reports no adjuvant endocrine therapy was recommended. The current symptoms suggestive of breast cancer recurrence at this time.  No weight loss, no night sweats no fevers no chills no chest wall new skin lesions or rashes.  Still notes some intermittent orthostatic dizziness.  INTERVAL HISTORY  Patient is here for her scheduled follow-up to discuss the workup results of her MGUS she notes no acute new symptoms.[name]   MEDICAL HISTORY:  Past Medical History:  Diagnosis Date  . Anemia   . Cancer [facility] [place])    Breast cancer  .  Diabetes mellitus   . History of stroke 11/2016   incidental finding on MRI  . Hypercholesteremia   . Hypertension   . Peripheral vascular disease ([place]) 2015   bilateral moderate CA disease-Dr Bridgett Larsson  -Chronic kidney disease  stage II-III -GERD 02/2014 EGD showed Barrett esophagus metaplasia without dysplasia -History of shingles History of ocular migraines Osteopenia Carotid artery stenosis   Patient Active Problem List   Diagnosis Date Noted  . Nonischemic cardiomyopathy ([place]) 12/08/2016  . SOB (shortness of breath) 11/12/2016  . Shortness of breath 11/11/2016  . Chronic respiratory failure with hypoxia ([place]) 11/11/2016  . Anemia of chronic disease 03/24/2016  . Acute on chronic combined systolic and diastolic CHF (congestive heart failure) ([place]) 03/24/2016  . Acute respiratory failure with hypoxia ([place]) 03/24/2016  . Scarring of lung following radiation- RLL 03/24/2016  . Essential hypertension   . Acute encephalopathy 12/13/2015  . Hyponatremia 12/13/2015  . Hypokalemia 12/13/2015  . UTI (lower urinary tract infection) 12/13/2015  . Leukocytosis 12/13/2015  . Hypercholesteremia   . Diabetes mellitus without complication ([place])   . History of stroke   . Peripheral vascular disease ([place])   . Carotid stenosis 03/13/2015    SURGICAL HISTORY: Past Surgical History:  Procedure Laterality Date  . ABDOMINAL HYSTERECTOMY    . APPENDECTOMY  1951  . [place]  . CHOLECYSTECTOMY  2003   By Dr. Excell Seltzer  . EYE SURGERY Bilateral 2009   Cataract surgery  . FRACTURE SURGERY Right    ORIF   by Dr. Burney Gauze  . HUMERUS FRACTURE SURGERY    . MASTECTOMY Bilateral   . NASAL SEPTUM SURGERY  1977   Dr. Ernesto Rutherford  . TOENAIL EXCISION      SOCIAL HISTORY: Social History   Social History  . Marital status: Widowed    Spouse name: N/A  . Number of children: N/A  . Years of education: N/A   Occupational History  . Not on file.   Social History Main Topics  . Smoking status: Former Smoker    Quit date: 08/24/1978  . Smokeless tobacco: Never Used  . Alcohol use No  . Drug use: No  . Sexual activity: Not on file   Other Topics Concern  . Not on file   Social History Narrative  .  No narrative on file  currently lives at [facility] facility  FAMILY HISTORY: Family History  Problem Relation Age of Onset  . Cancer Mother   . Pulmonary embolism Mother   . Heart disease Father   . Heart attack Father   . Stroke Father   . Diabetes Brother   . Hyperlipidemia Brother   . Stroke Brother   . Kidney disease Brother     ALLERGIES:  is allergic to morphine and related; other; sulfa antibiotics; and nickel.  MEDICATIONS:  Current Outpatient Prescriptions  Medication Sig Dispense Refill  . atorvastatin (LIPITOR) 80 MG tablet Take 80 mg by mouth at bedtime.    [name] BIOTIN PO Take 1 tablet by mouth daily.    . busPIRone (BUSPAR) 5 MG tablet Take 1 tablet (5 mg total) by mouth 2 (two) times daily. 60 tablet 2  . carvedilol (COREG) 6.25 MG tablet Take 6.25 mg by mouth 2 (two) times daily with a meal.    . cetirizine (ZYRTEC) 10 MG tablet Take 10 mg by mouth at bedtime.    . cholecalciferol (VITAMIN D) 1000 units tablet Take 1,000 Units by mouth every evening.     [name]  clopidogrel (PLAVIX) 75 MG tablet Take 75 mg by mouth daily.    . fluticasone (FLONASE) 50 MCG/ACT nasal spray Place 1 spray into both nostrils daily as needed for allergies or rhinitis.     [name] FOLIC ACID PO Take 1 tablet by mouth daily.    . furosemide (LASIX) 40 MG tablet Take 1 tablet (40 mg total) by mouth daily. 30 tablet 3  . gabapentin (NEURONTIN) 100 MG capsule Take 100 mg by mouth 2 (two) times daily.    [name] guaiFENesin (MUCINEX) 600 MG 12 hr tablet Take 1 tablet (600 mg total) by mouth 2 (two) times daily. 30 tablet 0  . meclizine (ANTIVERT) 25 MG tablet Take 1 tablet (25 mg total) by mouth 3 (three) times daily as needed for dizziness. 90 tablet 0  . Melatonin 5 MG TABS Take 10 mg by mouth at bedtime as needed (sleep).    . Multiple Vitamin (MULTIVITAMIN WITH MINERALS) TABS tablet Take 1 tablet by mouth daily.     [name] omeprazole (PRILOSEC) 20 MG capsule Take 20 mg by mouth daily.    .  polyethylene glycol (MIRALAX / GLYCOLAX) packet Take 17 g by mouth daily. 14 each 0  . sacubitril-valsartan (ENTRESTO) 24-26 MG Take 1 tablet by mouth 2 (two) times daily.    [name] senna-docusate (SENOKOT-S) 8.6-50 MG tablet Take 1 tablet by mouth at bedtime. 30 tablet 0  . vitamin C (ASCORBIC ACID) 500 MG tablet Take 1,000 mg by mouth daily.     No current facility-administered medications for this visit.     REVIEW OF SYSTEMS:    10 Point review of Systems was done is negative except as noted above.  PHYSICAL EXAMINATION: ECOG PERFORMANCE STATUS: 2 - Symptomatic, <50% confined to bed  . Vitals:   06/01/16 1027  BP: 140/66  Pulse: 68  Resp: 18  Temp: 98 F (36.7 C)   Filed Weights   06/01/16 1027  Weight: 139 lb 3.2 oz (63.1 kg)   .Body mass index is 24.66 kg/m.  GENERAL:alert, in no acute distress and comfortable SKIN: skin color, texture, turgor are normal, no rashes or significant lesions EYES: normal, conjunctiva are pink and non-injected, sclera clear OROPHARYNX:no exudate, no erythema and lips, buccal mucosa, and tongue normal  NECK: supple, no JVD, thyroid normal size, non-tender, without nodularity LYMPH:  no palpable lymphadenopathy in the cervical, axillary or inguinal LUNGS: clear to auscultation with normal respiratory effort BREAST: bilateral old healed surgical scars of previous mastectomy.  No new skin lesions or lumps.  No palpable regional lymphadenopathy. HEART: regular rate & rhythm,  no murmurs and no lower extremity edema ABDOMEN: abdomen soft, non-tender, normoactive bowel sounds , no palpable hepatosplenomegaly Musculoskeletal: bilateral 1+ pitting pedal edema PSYCH: alert & oriented x 3 with fluent speech NEURO: no focal motor/sensory deficits  LABORATORY DATA:  I have reviewed the data as listed  . Component     Latest Ref Rng & Units 05/08/2016  WBC     3.9 - 10.3 10e3/uL 8.1  NEUT#     1.5 - 6.5 10e3/uL 6.2  Hemoglobin     11.6 - 15.9  g/dL 11.7  HCT     34.8 - 46.6 % 37.1  Platelets     145 - 400 10e3/uL 216  MCV     79.5 - 101.0 fL 84.5  MCH     25.1 - 34.0 pg 26.7  MCHC     31.5 - 36.0 g/dL 31.5  RBC  3.70 - 5.45 10e6/uL 4.39  RDW     11.2 - 14.5 % 16.5 (H)  lymph#     0.9 - 3.3 10e3/uL 1.1  MONO#     0.1 - 0.9 10e3/uL 0.5  Eosinophils Absolute     0.0 - 0.5 10e3/uL 0.2  Basophils Absolute     0.0 - 0.1 10e3/uL 0.0  NEUT%     38.4 - 76.8 % 77.0 (H)  LYMPH%     14.0 - 49.7 % 14.0  MONO%     0.0 - 14.0 % 6.4  EOS%     0.0 - 7.0 % 2.4  BASO%     0.0 - 2.0 % 0.2  Retic %     0.70 - 2.10 % 1.40  Retic Ct Abs     33.70 - 90.70 10e3/uL 61.46  Immature Retic Fract     1.60 - 10.00 % 6.10  Sodium     136 - 145 mEq/L 137  Potassium     3.5 - 5.1 mEq/L 4.5  Chloride     98 - 109 mEq/L 99  CO2     22 - 29 mEq/L 29  Glucose     70 - 140 mg/dl 90  BUN     7.0 - 26.0 mg/dL 12.8  Creatinine     0.6 - 1.1 mg/dL 0.8  Total Bilirubin     0.20 - 1.20 mg/dL 0.67  Alkaline Phosphatase     40 - 150 U/L 69  AST     5 - 34 U/L 18  ALT     0 - 55 U/L 19  Total Protein     6.4 - 8.3 g/dL 7.3  Albumin     3.5 - 5.0 g/dL 3.8  Calcium     8.4 - 10.4 mg/dL 9.8  Anion gap     3 - 11 mEq/L 9  EGFR     >90 ml/min/1.73 m2 73 (L)  IgG (Immunoglobin G), Serum     700 - 1,600 mg/dL 878  IgA/Immunoglobulin A, Serum     64 - 422 mg/dL 186  IgM, Qn, Serum     26 - 217 mg/dL 77  Total Protein     6.0 - 8.5 g/dL 6.7  Albumin SerPl Elph-Mcnc     2.9 - 4.4 g/dL 3.6  Alpha 1     0.0 - 0.4 g/dL 0.3  Alpha2 Glob SerPl Elph-Mcnc     0.4 - 1.0 g/dL 0.9  B-Globulin SerPl Elph-Mcnc     0.7 - 1.3 g/dL 1.0  Gamma Glob SerPl Elph-Mcnc     0.4 - 1.8 g/dL 0.8  M Protein SerPl Elph-Mcnc     Not Observed g/dL Not Observed  Globulin, Total     2.2 - 3.9 g/dL 3.1  Albumin/Glob SerPl     0.7 - 1.7 1.2  IFE 1      Comment  Please Note (HCV):      Comment  Iron     41 - 142 ug/dL 52  TIBC     236 - 444  ug/dL 361  UIBC     120 - 384 ug/dL 309  %SAT     21 - 57 % 14 (L)  Ig Kappa Free Light Chain     3.3 - 19.4 mg/L 17.8  Ig Lambda Free Light Chain     5.7 - 26.3 mg/L 18.8  Kappa/Lambda FluidC Ratio     0.26 - 1.65 0.95  Sed Rate     0 - 40 mm/hr 6  Vitamin B12     211 - 946 pg/mL 448  LDH     125 - 245 U/L 209  Haptoglobin     34 - 200 mg/dL 148  Erythropoietin     2.6 - 18.5 mIU/mL 24.6 (H)  Copper     72 - 166 ug/dL 112  Ferritin     9 - 269 ng/ml 137  Glucose-Capillary     65 - 99 mg/dL    IFE 1  Comment   Comment: An apparent normal immunofixation pattern.     RADIOGRAPHIC STUDIES: I have personally reviewed the radiological images as listed and agreed with the findings in the report. No results found.  ASSESSMENT & PLAN:   83 year old Caucasian female with multiple medical comorbidities noted above with  #1 IgG Kappa monoclonal gammopathy of undetermined significance noted on immunofixation electrophoresis. #2 normocytic anemia Patient had a rpt SPEP which showed no clear M spike and negative IFE.  UPEP showed no significant total urinary protein  And had negative M spike. Sedimentation rate within normal limits at 16 LDH today within normal limits Patient did have new anemia noted on hospitalization in July 2017.  Workup appears to be consistent with anemia of chronic disease. No recent change in creatinine and her creatinine levels are fairly normal.  No significant proteinuria.  No hypercalcemia.  No new focal bone pains.. However her hemoglobin on labs today appears to have new normalized with a hemoglobin of 11.7 with an MCV of 84.7.  Normalization of her hemoglobin makes the possibility of multiple myeloma for MDS related to her previous chemotherapy for breast cancer less likely. Plan - repeat an SPEP with quantitative immunoglobulins and IFE neg for monoclonal paraproteinemia. Results discussed in details with the patient.  Patient might have had  mild monoclonal gammopathy in relation to her recent admission for urinary tract infection with reactive monoclonal gammopathy. -Reasonable to replace iron with iron polysaccharide to maintain her ferritin levels closer to 250 in the setting of anemia of chronic disease and lower iron saturations to treat functional iron deficiency. -no role for EPO at this time given near-normal hemoglobin levels but might need to be considered if her hemoglobin drops to below 9. -It would also be appropriate to have the patient on B complex replacement 1 tablet by mouth daily.  #3.Breast Cancer  history of stage III right-sided breast cancer status post mastectomy as well as a lymph node dissection, adjuvant chemotherapy and radiation in 1988 History of a left-sided stage I breast cancer status post mastectomy and radiation. Plan -No clinical evidence of disease recurrence at this time. -Continued followup with primary care physician  #4 Patient Active Problem List   Diagnosis Date Noted  . Nonischemic cardiomyopathy ([place]) 12/08/2016  . SOB (shortness of breath) 11/12/2016  . Shortness of breath 11/11/2016  . Chronic respiratory failure with hypoxia ([place]) 11/11/2016  . Anemia of chronic disease 03/24/2016  . Acute on chronic combined systolic and diastolic CHF (congestive heart failure) ([place]) 03/24/2016  . Acute respiratory failure with hypoxia ([place]) 03/24/2016  . Scarring of lung following radiation- RLL 03/24/2016  . Essential hypertension   . Acute encephalopathy 12/13/2015  . Hyponatremia 12/13/2015  . Hypokalemia 12/13/2015  . UTI (lower urinary tract infection) 12/13/2015  . Leukocytosis 12/13/2015  . Hypercholesteremia   . Diabetes mellitus without complication ([place])   . History of stroke   . Peripheral vascular disease ([place])   .  Carotid stenosis 03/13/2015   -continue followup with Dr. Kathyrn Lass her primary care physician for continued management of chronic medical  comorbidities.  RTC with Dr Irene Limbo on an as needed basis.  All of the patients questions were answered with apparent satisfaction. The patient knows to call the clinic with any problems, questions or concerns.  I spent 15 minutes counseling the patient face to face. The total time spent in the appointment was 20 minutes and more than 50% was on counseling and direct patient cares.    Sullivan Lone MD [place] AAHIVMS [facility] Hematology/Oncology Physician [facility]  (Office):       [phone] (Work cell):  [phone] (Fax):           [phone]  01/07/2017 11:54 PM

## 2017-01-07 NOTE — Telephone Encounter (Signed)
Please call,pt wants to know if she needs to be on a diabetic diet?

## 2017-01-12 DIAGNOSIS — I2510 Atherosclerotic heart disease of native coronary artery without angina pectoris: Secondary | ICD-10-CM | POA: Diagnosis not present

## 2017-01-12 DIAGNOSIS — I110 Hypertensive heart disease with heart failure: Secondary | ICD-10-CM | POA: Diagnosis not present

## 2017-01-12 DIAGNOSIS — E1151 Type 2 diabetes mellitus with diabetic peripheral angiopathy without gangrene: Secondary | ICD-10-CM | POA: Diagnosis not present

## 2017-01-12 DIAGNOSIS — I5043 Acute on chronic combined systolic (congestive) and diastolic (congestive) heart failure: Secondary | ICD-10-CM | POA: Diagnosis not present

## 2017-01-12 DIAGNOSIS — J14 Pneumonia due to Hemophilus influenzae: Secondary | ICD-10-CM | POA: Diagnosis not present

## 2017-01-12 DIAGNOSIS — J440 Chronic obstructive pulmonary disease with acute lower respiratory infection: Secondary | ICD-10-CM | POA: Diagnosis not present

## 2017-01-15 ENCOUNTER — Ambulatory Visit (INDEPENDENT_AMBULATORY_CARE_PROVIDER_SITE_OTHER): Payer: Medicare Other | Admitting: Student

## 2017-01-15 ENCOUNTER — Encounter: Payer: Self-pay | Admitting: Student

## 2017-01-15 VITALS — BP 100/70 | HR 64 | Ht 63.0 in | Wt 136.0 lb

## 2017-01-15 DIAGNOSIS — I5043 Acute on chronic combined systolic (congestive) and diastolic (congestive) heart failure: Secondary | ICD-10-CM | POA: Diagnosis not present

## 2017-01-15 DIAGNOSIS — E785 Hyperlipidemia, unspecified: Secondary | ICD-10-CM | POA: Diagnosis not present

## 2017-01-15 DIAGNOSIS — Z79899 Other long term (current) drug therapy: Secondary | ICD-10-CM | POA: Diagnosis not present

## 2017-01-15 DIAGNOSIS — J9611 Chronic respiratory failure with hypoxia: Secondary | ICD-10-CM

## 2017-01-15 DIAGNOSIS — I10 Essential (primary) hypertension: Secondary | ICD-10-CM

## 2017-01-15 DIAGNOSIS — I428 Other cardiomyopathies: Secondary | ICD-10-CM | POA: Diagnosis not present

## 2017-01-15 DIAGNOSIS — E1151 Type 2 diabetes mellitus with diabetic peripheral angiopathy without gangrene: Secondary | ICD-10-CM | POA: Diagnosis not present

## 2017-01-15 DIAGNOSIS — J14 Pneumonia due to Hemophilus influenzae: Secondary | ICD-10-CM | POA: Diagnosis not present

## 2017-01-15 DIAGNOSIS — J440 Chronic obstructive pulmonary disease with acute lower respiratory infection: Secondary | ICD-10-CM | POA: Diagnosis not present

## 2017-01-15 DIAGNOSIS — I2510 Atherosclerotic heart disease of native coronary artery without angina pectoris: Secondary | ICD-10-CM | POA: Diagnosis not present

## 2017-01-15 DIAGNOSIS — I5042 Chronic combined systolic (congestive) and diastolic (congestive) heart failure: Secondary | ICD-10-CM

## 2017-01-15 DIAGNOSIS — I6523 Occlusion and stenosis of bilateral carotid arteries: Secondary | ICD-10-CM

## 2017-01-15 DIAGNOSIS — I110 Hypertensive heart disease with heart failure: Secondary | ICD-10-CM | POA: Diagnosis not present

## 2017-01-15 LAB — BASIC METABOLIC PANEL
BUN/Creatinine Ratio: 20 (ref 12–28)
BUN: 17 mg/dL (ref 8–27)
CO2: 29 mmol/L (ref 18–29)
Calcium: 9.4 mg/dL (ref 8.7–10.3)
Chloride: 95 mmol/L — ABNORMAL LOW (ref 96–106)
Creatinine, Ser: 0.86 mg/dL (ref 0.57–1.00)
GFR calc Af Amer: 71 mL/min/{1.73_m2} (ref >59)
GFR calc non Af Amer: 62 mL/min/{1.73_m2} (ref >59)
Glucose: 62 mg/dL — ABNORMAL LOW (ref 65–99)
Potassium: 5.5 mmol/L — ABNORMAL HIGH (ref 3.5–5.2)
Sodium: 139 mmol/L (ref 134–144)

## 2017-01-15 MED ORDER — SACUBITRIL-VALSARTAN 24-26 MG PO TABS: 1.0000 | ORAL_TABLET | Freq: Two times a day (BID) | ORAL | 3 refills | Status: DC

## 2017-01-15 MED ORDER — FUROSEMIDE 40 MG PO TABS
40.0000 mg | ORAL_TABLET | Freq: Every day | ORAL | 3 refills | Status: DC
Start: 2017-01-15 — End: 2019-02-02

## 2017-01-15 MED ORDER — CARVEDILOL 6.25 MG PO TABS: 6.2500 mg | ORAL_TABLET | Freq: Two times a day (BID) | ORAL | 3 refills | Status: DC

## 2017-01-15 NOTE — Progress Notes (Signed)
Cardiology Office Note    Date:  01/16/2017   ID:  Helen [name], DOB 01/25/1930, MRN [id number]  PCP:  Vernie Shanks, MD  Cardiologist: Dr. Oval Linsey  Chief Complaint  Patient presents with  . Follow-up    still having some dizziness    History of Present Illness:    Helen [name] is a 83 y.o. female with past medical history of chronic combined systolic and diastolic CHF (EF 65-99% by echo in 11/2016), HTN, HLD, carotid artery disease, prior CVA, and remote history of breast cancer (s/p chemo and double mastectomy) who presents to the office today for 2 week follow-up.  She was recently examined by Dr. Oval Linsey on 01/01/2017 and denied any shortness of breath or chest discomfort when performing physical therapy rehabilitation. She reported worsening edema over the past several days. At the time of her visit, Losartan was discontinued and she was started on Entresto 24/26 mg BID. It was thought her edema might improve with this addition and if not her Lasix would need to be further increased. Atenolol was also discontinued and switched to Coreg 6.25mg  BID in the setting of her cardiomyopathy.   In talking with the patient today, she reports significant improvement in her lower extremity edema since her last office visit. She denies any orthopnea or PND. She does have dyspnea on exertion at baseline and has been using 2L nasal cannula since her hospitalization in 10/2016. She is curious how long she needs to remain on oxygen supplementation as she reports walking around her apartment without difficulty. She does ambulate quite a distance to dinner (lives in an independent living facility) and reports she notices dyspnea with this. She denies any associated chest discomfort or palpitations.  She reports having dizziness occurring when changing positions and when walking. This has been present for over a year. She did not notice any improvement or worsening of this with her recent  medication changes.   Past Medical History:  Diagnosis Date  . Anemia   . Cancer [facility])    Breast cancer  . Chronic combined systolic (congestive) and diastolic (congestive) heart failure (HCC)    a. EF 25-30% by echo in 11/2016. Recent NST showing no inducible ischemia --> therefore thought to be most consistent with nonischemic cardiomyopathy.   . Diabetes mellitus   . History of stroke 11/2016   incidental finding on MRI  . Hypercholesteremia   . Hypertension   . Peripheral vascular disease ([place]) 2015   bilateral moderate CA disease-Dr Bridgett Larsson    Past Surgical History:  Procedure Laterality Date  . ABDOMINAL HYSTERECTOMY    . APPENDECTOMY  1951  . [place]  . CHOLECYSTECTOMY  2003   By Dr. Excell Seltzer  . EYE SURGERY Bilateral 2009   Cataract surgery  . FRACTURE SURGERY Right    ORIF   by Dr. Burney Gauze  . HUMERUS FRACTURE SURGERY    . MASTECTOMY Bilateral   . NASAL SEPTUM SURGERY  1977   Dr. Ernesto Rutherford  . TOENAIL EXCISION      Current Medications: Outpatient Medications Prior to Visit  Medication Sig Dispense Refill  . atorvastatin (LIPITOR) 80 MG tablet Take 80 mg by mouth at bedtime.    [name] BIOTIN PO Take 1 tablet by mouth daily.    . cholecalciferol (VITAMIN D) 1000 units tablet Take 1,000 Units by mouth every evening.     . clopidogrel (PLAVIX) 75 MG tablet Take 75 mg by mouth daily.    [name]  fluticasone (FLONASE) 50 MCG/ACT nasal spray Place 1 spray into both nostrils daily as needed for allergies or rhinitis.     [name] meclizine (ANTIVERT) 25 MG tablet Take 1 tablet (25 mg total) by mouth 3 (three) times daily as needed for dizziness. 90 tablet 0  . Melatonin 10 MG TABS Take 10 mg by mouth at bedtime as needed (sleep).    . Multiple Vitamin (MULTIVITAMIN WITH MINERALS) TABS tablet Take 1 tablet by mouth daily.     [name] omeprazole (PRILOSEC) 20 MG capsule Take 20 mg by mouth daily.    . vitamin C (ASCORBIC ACID) 500 MG tablet Take 1,000 mg by mouth daily.    .  carvedilol (COREG) 6.25 MG tablet Take 6.25 mg by mouth 2 (two) times daily with a meal.    . furosemide (LASIX) 40 MG tablet Take 1 tablet (40 mg total) by mouth daily. 30 tablet 3  . sacubitril-valsartan (ENTRESTO) 24-26 MG Take 1 tablet by mouth 2 (two) times daily.    . busPIRone (BUSPAR) 5 MG tablet Take 1 tablet (5 mg total) by mouth 2 (two) times daily. (Patient not taking: Reported on 01/15/2017) 60 tablet 2  . cetirizine (ZYRTEC) 10 MG tablet Take 10 mg by mouth at bedtime.    [name] FOLIC ACID PO Take 1 tablet by mouth daily.    [name] gabapentin (NEURONTIN) 100 MG capsule Take 100 mg by mouth 2 (two) times daily.    [name] guaiFENesin (MUCINEX) 600 MG 12 hr tablet Take 1 tablet (600 mg total) by mouth 2 (two) times daily. (Patient not taking: Reported on 01/15/2017) 30 tablet 0  . polyethylene glycol (MIRALAX / GLYCOLAX) packet Take 17 g by mouth daily. (Patient not taking: Reported on 01/15/2017) 14 each 0  . senna-docusate (SENOKOT-S) 8.6-50 MG tablet Take 1 tablet by mouth at bedtime. (Patient not taking: Reported on 01/15/2017) 30 tablet 0   No facility-administered medications prior to visit.      Allergies:   Morphine and related; Other; Sulfa antibiotics; and Nickel   Social History   Social History  . Marital status: Widowed    Spouse name: N/A  . Number of children: N/A  . Years of education: N/A   Social History Main Topics  . Smoking status: Former Smoker    Quit date: 08/24/1978  . Smokeless tobacco: Never Used  . Alcohol use No  . Drug use: No  . Sexual activity: Not Asked   Other Topics Concern  . None   Social History Narrative  . None     Family History:  The patient's family history includes Cancer in her mother; Diabetes in her brother; Heart attack in her father; Heart disease in her father; Hyperlipidemia in her brother; Kidney disease in her brother; Pulmonary embolism in her mother; Stroke in her brother and father.   Review of Systems:   Please see the history  of present illness.     General:  No chills, fever, night sweats or weight changes.  Cardiovascular:  No chest pain, edema, orthopnea, palpitations, paroxysmal nocturnal dyspnea. Positive for dyspnea on exertion.  Dermatological: No rash, lesions/masses Respiratory: No cough, dyspnea Urologic: No hematuria, dysuria Abdominal:   No nausea, vomiting, diarrhea, bright red blood per rectum, melena, or hematemesis Neurologic:  No visual changes, wkns, changes in mental status. Positive for dizziness.  All other systems reviewed and are otherwise negative except as noted above.   Physical Exam:    VS:  BP 100/70   Pulse  64   Ht 5\' 3"  (1.6 m)   Wt 136 lb (61.7 kg)   BMI 24.09 kg/m    General: Well developed, well nourished Caucasian female appearing in no acute distress. Head: Normocephalic, atraumatic, sclera non-icteric, no xanthomas, nares are without discharge.  Neck: No carotid bruits. JVD not elevated.  Lungs: Respirations regular and unlabored, without wheezes or rales.  Heart: Regular rate and rhythm. No S3 or S4.  No murmur, no rubs, or gallops appreciated. Abdomen: Soft, non-tender, non-distended with normoactive bowel sounds. No hepatomegaly. No rebound/guarding. No obvious abdominal masses. Msk:  Strength and tone appear normal for age. No joint deformities or effusions. Extremities: No clubbing or cyanosis. No lower extremity edema.  Distal pedal pulses are 2+ bilaterally. Neuro: Alert and oriented X 3. Moves all extremities spontaneously. No focal deficits noted. Psych:  Responds to questions appropriately with a normal affect. Skin: No rashes or lesions noted  Wt Readings from Last 3 Encounters:  01/15/17 136 lb (61.7 kg)  01/01/17 136 lb 12.8 oz (62.1 kg)  12/08/16 145 lb (65.8 kg)     Studies/Labs Reviewed:   EKG:  EKG is not ordered today.  Recent Labs: 11/17/2016: ALT 100; Hemoglobin 11.2; Magnesium 2.1; Platelets 203; TSH 4.721 12/08/2016: Brain Natriuretic  Peptide 2,247.2 01/15/2017: BUN 17; Creatinine, Ser 0.86; Potassium 5.5; Sodium 139   Lipid Panel    Component Value Date/Time   CHOL 84 11/16/2016 0521   TRIG 91 11/16/2016 0521   HDL 18 (L) 11/16/2016 0521   CHOLHDL 4.7 11/16/2016 0521   VLDL 18 11/16/2016 0521   LDLCALC 48 11/16/2016 0521    Additional studies/ records that were reviewed today include:   Echocardiogram: 12/21/2016 Study Conclusions  - Left ventricle: The cavity size was normal. Wall thickness was   increased in a pattern of mild LVH. Systolic function was   severely reduced. The estimated ejection fraction was in the   range of 25% to 30%. Diffuse hypokinesis. Doppler parameters are   consistent with restrictive physiology, indicative of decreased   left ventricular diastolic compliance and/or increased left   atrial pressure. Doppler parameters are consistent with high   ventricular filling pressure. - Aortic valve: Valve mobility was restricted. There was trivial   regurgitation. - Mitral valve: Calcified annulus. Mildly thickened leaflets .   There was mild regurgitation. - Left atrium: The atrium was mildly dilated. - Right ventricle: The cavity size was mildly dilated. Systolic   function was mildly reduced. - Right atrium: The atrium was mildly dilated. - Pulmonary arteries: Systolic pressure was moderately increased.   PA peak pressure: 53 mm Hg (S). - Pericardium, extracardiac: A trivial pericardial effusion was   identified.  Impressions:  - Severe global reduction in LV systolic function; restrictive   filling; trace AI; mild MR; mild biatrial enlargement; mild RVE   with mildly reduced RV function; mild TR with moderately elevated   pulmonary pressure.   NST: 09/2016  The left ventricular ejection fraction is moderately decreased (30-44%).  Nuclear stress EF: 30%.  There is a medium defect of moderate severity present in the basal inferior, mid inferior and apical inferior  location. The defect is non-reversible and likely secondary to diaphragmatic attenuation artifact and extracardiac tracer uptake as there is no focal wall motion abnormality in this area. No ischemia noted.  This is an intermediate risk study due to LV dysfunction.  There was downsloping ST segment depression in the inferolateral leads at baseline with no change during infusion.  Assessment:    1. Chronic combined systolic (congestive) and diastolic (congestive) heart failure ([place])   2. Nonischemic cardiomyopathy ([place])   3. Medication management   4. Chronic respiratory failure with hypoxia (HCC)   5. Essential hypertension   6. Hyperlipidemia, unspecified hyperlipidemia type      Plan:   In order of problems listed above:  1. Chronic combined systolic and diastolic CHF/ Nonischemic Cardiomyopathy - EF reduced to 25-30% by echo in 11/2016. Recent NST showed no inducible ischemia and she denies any recent anginal symptoms, therefore thought to be most consistent with a nonischemic cardiomyopathy.  - she reports significant improvement in her lower extremity edema since being started on Entresto. Has baseline dyspnea on exertion but denies any orthopnea or PND. She is limiting her salt intake and following fluid restrictions.  - continue Coreg 6.25mg  BID, Lasix 40mg  daily, and Entresto 24-26mg  BID. Will not further titrate Entresto at this time in the setting of her borderline HTN (currently at 100/70 today, she reports SBP is usually in the 120's - 130's) and baseline dizziness (no acute changes in this with her recent medication adjustment, as symptoms have occurred for over a year). Will check a BMET to assess creatinine with the recent addition of Entresto.   2. Chronic Respiratory Failure  - she uses 1L [place] at rest and 2L [place] with activity since her hospitalization earlier this year. She strongly wishes to wean wearing her oxygen as she is able to walk around her apartment without the use  of it and does not experience any symptoms. - We checked ambulatory oxygen saturations today with her oxygen saturations remaining greater than 94% on 1 L and greater than 92% on room air. Advised her to use 1L [place] with activity (mostly for her long walk to the dining facility) at this time and PRN at rest. She is scheduled to see her PCP next week and it can be further assessed at that time if she can be weaned to just RA alone with activity.   3. HTN - BP at 100/70 during today's visit. She reports SBP is usually in the 120's - 130's.  - continue Coreg 6.25mg  BID, Lasix 40mg  daily, and Entresto 24-26mg  BID.   4. HLD - Lipid Panel in 10/2016 showed total cholesterol of 84, HDL 18, and LDL 48. - continue statin therapy. She is on high-dose Atorvastatin 80mg  daily in the setting of her prior CVA.   Medication Adjustments/Labs and Tests Ordered: Current medicines are reviewed at length with the patient today.  Concerns regarding medicines are outlined above.  Medication changes, Labs and Tests ordered today are listed in the Patient Instructions below. Patient Instructions  Medication Instructions:  Your physician recommends that you continue on your current medications as directed. Please refer to the Current Medication list given to you today.  Labwork: Please have labwork TODAY (BMET)  Testing/Procedures: NONE  Follow-Up: Keep follow up with Dr. Oval Linsey  Any Other Special Instructions Will Be Listed Below (If Applicable).  If you need a refill on your cardiac medications before your next appointment, please call your pharmacy.   Signed, Erma Heritage, PA-C  01/16/2017 9:49 AM    [facility], [place], [place]  [address] Phone: [phone]; Fax: [phone]  [address] [place], [place] [address] Phone: [phone]

## 2017-01-15 NOTE — Patient Instructions (Signed)
Medication Instructions:  Your physician recommends that you continue on your current medications as directed. Please refer to the Current Medication list given to you today.  Labwork: Please have labwork TODAY (BMET)  Testing/Procedures: NONE  Follow-Up: Keep follow up with Dr. Oval Linsey  Any Other Special Instructions Will Be Listed Below (If Applicable).     If you need a refill on your cardiac medications before your next appointment, please call your pharmacy.

## 2017-01-16 ENCOUNTER — Encounter: Payer: Self-pay | Admitting: Student

## 2017-01-17 DIAGNOSIS — I2510 Atherosclerotic heart disease of native coronary artery without angina pectoris: Secondary | ICD-10-CM | POA: Diagnosis not present

## 2017-01-17 DIAGNOSIS — E1151 Type 2 diabetes mellitus with diabetic peripheral angiopathy without gangrene: Secondary | ICD-10-CM | POA: Diagnosis not present

## 2017-01-17 DIAGNOSIS — I110 Hypertensive heart disease with heart failure: Secondary | ICD-10-CM | POA: Diagnosis not present

## 2017-01-17 DIAGNOSIS — J449 Chronic obstructive pulmonary disease, unspecified: Secondary | ICD-10-CM | POA: Diagnosis not present

## 2017-01-17 DIAGNOSIS — I5043 Acute on chronic combined systolic (congestive) and diastolic (congestive) heart failure: Secondary | ICD-10-CM | POA: Diagnosis not present

## 2017-01-17 DIAGNOSIS — J9611 Chronic respiratory failure with hypoxia: Secondary | ICD-10-CM | POA: Diagnosis not present

## 2017-01-19 DIAGNOSIS — J449 Chronic obstructive pulmonary disease, unspecified: Secondary | ICD-10-CM | POA: Diagnosis not present

## 2017-01-19 DIAGNOSIS — I110 Hypertensive heart disease with heart failure: Secondary | ICD-10-CM | POA: Diagnosis not present

## 2017-01-19 DIAGNOSIS — I2510 Atherosclerotic heart disease of native coronary artery without angina pectoris: Secondary | ICD-10-CM | POA: Diagnosis not present

## 2017-01-19 DIAGNOSIS — E1151 Type 2 diabetes mellitus with diabetic peripheral angiopathy without gangrene: Secondary | ICD-10-CM | POA: Diagnosis not present

## 2017-01-19 DIAGNOSIS — J9611 Chronic respiratory failure with hypoxia: Secondary | ICD-10-CM | POA: Diagnosis not present

## 2017-01-19 DIAGNOSIS — I5043 Acute on chronic combined systolic (congestive) and diastolic (congestive) heart failure: Secondary | ICD-10-CM | POA: Diagnosis not present

## 2017-01-20 ENCOUNTER — Telehealth: Payer: Self-pay | Admitting: Cardiovascular Disease

## 2017-01-20 DIAGNOSIS — I2510 Atherosclerotic heart disease of native coronary artery without angina pectoris: Secondary | ICD-10-CM | POA: Diagnosis not present

## 2017-01-20 DIAGNOSIS — J449 Chronic obstructive pulmonary disease, unspecified: Secondary | ICD-10-CM | POA: Diagnosis not present

## 2017-01-20 DIAGNOSIS — E1151 Type 2 diabetes mellitus with diabetic peripheral angiopathy without gangrene: Secondary | ICD-10-CM | POA: Diagnosis not present

## 2017-01-20 DIAGNOSIS — I110 Hypertensive heart disease with heart failure: Secondary | ICD-10-CM | POA: Diagnosis not present

## 2017-01-20 DIAGNOSIS — I5043 Acute on chronic combined systolic (congestive) and diastolic (congestive) heart failure: Secondary | ICD-10-CM | POA: Diagnosis not present

## 2017-01-20 DIAGNOSIS — J9611 Chronic respiratory failure with hypoxia: Secondary | ICD-10-CM | POA: Diagnosis not present

## 2017-01-20 NOTE — Telephone Encounter (Signed)
Helen [name] is wanting to know what her Glucose Level is . She had labs done on last week . Please call

## 2017-01-20 NOTE — Telephone Encounter (Signed)
Spoke with pt, aware blood sugar was 62 on recent labs. Nothing else needed.

## 2017-01-21 DIAGNOSIS — R769 Abnormal immunological finding in serum, unspecified: Secondary | ICD-10-CM | POA: Diagnosis not present

## 2017-01-21 DIAGNOSIS — E119 Type 2 diabetes mellitus without complications: Secondary | ICD-10-CM | POA: Diagnosis not present

## 2017-01-21 DIAGNOSIS — I272 Pulmonary hypertension, unspecified: Secondary | ICD-10-CM | POA: Diagnosis not present

## 2017-01-21 DIAGNOSIS — E1151 Type 2 diabetes mellitus with diabetic peripheral angiopathy without gangrene: Secondary | ICD-10-CM | POA: Diagnosis not present

## 2017-01-21 DIAGNOSIS — J9611 Chronic respiratory failure with hypoxia: Secondary | ICD-10-CM | POA: Diagnosis not present

## 2017-01-21 DIAGNOSIS — E875 Hyperkalemia: Secondary | ICD-10-CM | POA: Diagnosis not present

## 2017-01-21 DIAGNOSIS — I2510 Atherosclerotic heart disease of native coronary artery without angina pectoris: Secondary | ICD-10-CM | POA: Diagnosis not present

## 2017-01-21 DIAGNOSIS — I5032 Chronic diastolic (congestive) heart failure: Secondary | ICD-10-CM | POA: Diagnosis not present

## 2017-01-21 DIAGNOSIS — I5043 Acute on chronic combined systolic (congestive) and diastolic (congestive) heart failure: Secondary | ICD-10-CM | POA: Diagnosis not present

## 2017-01-21 DIAGNOSIS — I110 Hypertensive heart disease with heart failure: Secondary | ICD-10-CM | POA: Diagnosis not present

## 2017-01-21 DIAGNOSIS — J449 Chronic obstructive pulmonary disease, unspecified: Secondary | ICD-10-CM | POA: Diagnosis not present

## 2017-01-22 DIAGNOSIS — I5043 Acute on chronic combined systolic (congestive) and diastolic (congestive) heart failure: Secondary | ICD-10-CM | POA: Diagnosis not present

## 2017-01-22 DIAGNOSIS — J449 Chronic obstructive pulmonary disease, unspecified: Secondary | ICD-10-CM | POA: Diagnosis not present

## 2017-01-22 DIAGNOSIS — J9611 Chronic respiratory failure with hypoxia: Secondary | ICD-10-CM | POA: Diagnosis not present

## 2017-01-22 DIAGNOSIS — I110 Hypertensive heart disease with heart failure: Secondary | ICD-10-CM | POA: Diagnosis not present

## 2017-01-22 DIAGNOSIS — E1151 Type 2 diabetes mellitus with diabetic peripheral angiopathy without gangrene: Secondary | ICD-10-CM | POA: Diagnosis not present

## 2017-01-22 DIAGNOSIS — I2510 Atherosclerotic heart disease of native coronary artery without angina pectoris: Secondary | ICD-10-CM | POA: Diagnosis not present

## 2017-01-26 ENCOUNTER — Telehealth: Payer: Self-pay | Admitting: Cardiovascular Disease

## 2017-01-26 DIAGNOSIS — R42 Dizziness and giddiness: Secondary | ICD-10-CM

## 2017-01-26 DIAGNOSIS — R001 Bradycardia, unspecified: Secondary | ICD-10-CM

## 2017-01-26 DIAGNOSIS — J9611 Chronic respiratory failure with hypoxia: Secondary | ICD-10-CM | POA: Diagnosis not present

## 2017-01-26 DIAGNOSIS — I110 Hypertensive heart disease with heart failure: Secondary | ICD-10-CM | POA: Diagnosis not present

## 2017-01-26 DIAGNOSIS — I2510 Atherosclerotic heart disease of native coronary artery without angina pectoris: Secondary | ICD-10-CM | POA: Diagnosis not present

## 2017-01-26 DIAGNOSIS — I5043 Acute on chronic combined systolic (congestive) and diastolic (congestive) heart failure: Secondary | ICD-10-CM | POA: Diagnosis not present

## 2017-01-26 DIAGNOSIS — J449 Chronic obstructive pulmonary disease, unspecified: Secondary | ICD-10-CM | POA: Diagnosis not present

## 2017-01-26 DIAGNOSIS — E1151 Type 2 diabetes mellitus with diabetic peripheral angiopathy without gangrene: Secondary | ICD-10-CM | POA: Diagnosis not present

## 2017-01-26 NOTE — Telephone Encounter (Signed)
No available appointments with PA/NP this week or next Will forward to Dr Oval Linsey for review

## 2017-01-26 NOTE — Telephone Encounter (Signed)
New message   Levada Dy RN from [facility] is calling for pt.   Pt c/o BP issue: STAT if pt c/o blurred vision, one-sided weakness or slurred speech  1. What are your last 5 BP readings? Sit-102/52 standing-122/60   2. Are you having any other symptoms (ex. Dizziness, headache, blurred vision, passed out)? Dizziness, ringing in ear when standing per RN.   3. What is your BP issue? Pt is complaining of being dizzy thought out the day per RN.

## 2017-01-26 NOTE — Telephone Encounter (Signed)
Returned call to Johnson with [facility]She stated patient is still complaining of dizziness.B/P listed below.Pulse very irregular 56 to 60 beats per min.Patient saw PCP last week he felt dizziness related to medications.Levada Dy asked PT to check vestibular for possible inner ear.Patient has appointment with Dr.Brady 03/12/17 she would like to be seen sooner.Advised I will send message to Dr.Lansdale's nurse for a sooner appointment.

## 2017-01-27 ENCOUNTER — Telehealth: Payer: Self-pay | Admitting: Cardiovascular Disease

## 2017-01-27 NOTE — Telephone Encounter (Signed)
New message   Pt c/o medication issue:  1. Name of Medication: Ondansetron  2. How are you currently taking this medication (dosage and times per day)? 4mg   3. Are you having a reaction (difficulty breathing--STAT)?   4. What is your medication issue? 1 tab by mouth 3x day if needed for nausea- message is for Helen [name] - pt forgot to add this to the list given to [facility]

## 2017-01-27 NOTE — Telephone Encounter (Signed)
Spoke with patient and she felt ok this am  About an hour after her medications she became lightheaded/off balance. She put her pulse oximetry on, initial reading O2 76, HR 38, then O2 98 HR 76, 90 & 40  All readings when she first put pulse ox on finger. Blood pressure 87/48, 104/68, 117/67 during this time. Patient has noticed her heart rate dropping to 30's-40's and staying down much longer than it has in the past, about an hour. Discussed with [place] S PA who saw patient 01/15/17 and she recommended 30 day event monitor Advised patient of recommendations  THIS WAS ALL DONE PRIOR TO SEEING DR [facility]'S RECOMMENDATIONS   Called patient again and gave her recommendations on decreasing Carvedilol, verbalized understanding

## 2017-01-27 NOTE — Telephone Encounter (Signed)
Follow up    Pt is calling to talk to RN about appt for heart monitor that is scheduled she wants to know how urgent should this appt be.

## 2017-01-27 NOTE — Telephone Encounter (Signed)
Spoke with patient and she will keep appointment as scheduled.

## 2017-01-27 NOTE — Addendum Note (Signed)
Addended by: Alvina Filbert B on: 01/27/2017 02:33 PM   Modules accepted: Orders

## 2017-01-27 NOTE — Telephone Encounter (Signed)
Try reducing carvedilol to 3.125mg , though it doesn't seem her dizziness is related to her BP or heart rate.  Consider follow up with PCP and consider a referral to neurology.

## 2017-01-28 NOTE — Telephone Encounter (Signed)
OK thank you 

## 2017-01-29 ENCOUNTER — Ambulatory Visit (INDEPENDENT_AMBULATORY_CARE_PROVIDER_SITE_OTHER): Payer: Medicare Other

## 2017-01-29 DIAGNOSIS — R001 Bradycardia, unspecified: Secondary | ICD-10-CM | POA: Diagnosis not present

## 2017-01-29 DIAGNOSIS — R42 Dizziness and giddiness: Secondary | ICD-10-CM | POA: Diagnosis not present

## 2017-01-30 DIAGNOSIS — I2510 Atherosclerotic heart disease of native coronary artery without angina pectoris: Secondary | ICD-10-CM | POA: Diagnosis not present

## 2017-01-30 DIAGNOSIS — E1151 Type 2 diabetes mellitus with diabetic peripheral angiopathy without gangrene: Secondary | ICD-10-CM | POA: Diagnosis not present

## 2017-01-30 DIAGNOSIS — J9611 Chronic respiratory failure with hypoxia: Secondary | ICD-10-CM | POA: Diagnosis not present

## 2017-01-30 DIAGNOSIS — I110 Hypertensive heart disease with heart failure: Secondary | ICD-10-CM | POA: Diagnosis not present

## 2017-01-30 DIAGNOSIS — J449 Chronic obstructive pulmonary disease, unspecified: Secondary | ICD-10-CM | POA: Diagnosis not present

## 2017-01-30 DIAGNOSIS — I5043 Acute on chronic combined systolic (congestive) and diastolic (congestive) heart failure: Secondary | ICD-10-CM | POA: Diagnosis not present

## 2017-02-01 DIAGNOSIS — E1151 Type 2 diabetes mellitus with diabetic peripheral angiopathy without gangrene: Secondary | ICD-10-CM | POA: Diagnosis not present

## 2017-02-01 DIAGNOSIS — J449 Chronic obstructive pulmonary disease, unspecified: Secondary | ICD-10-CM | POA: Diagnosis not present

## 2017-02-01 DIAGNOSIS — J9611 Chronic respiratory failure with hypoxia: Secondary | ICD-10-CM | POA: Diagnosis not present

## 2017-02-01 DIAGNOSIS — I5043 Acute on chronic combined systolic (congestive) and diastolic (congestive) heart failure: Secondary | ICD-10-CM | POA: Diagnosis not present

## 2017-02-01 DIAGNOSIS — I2510 Atherosclerotic heart disease of native coronary artery without angina pectoris: Secondary | ICD-10-CM | POA: Diagnosis not present

## 2017-02-01 DIAGNOSIS — I110 Hypertensive heart disease with heart failure: Secondary | ICD-10-CM | POA: Diagnosis not present

## 2017-02-02 DIAGNOSIS — J449 Chronic obstructive pulmonary disease, unspecified: Secondary | ICD-10-CM | POA: Diagnosis not present

## 2017-02-02 DIAGNOSIS — I5043 Acute on chronic combined systolic (congestive) and diastolic (congestive) heart failure: Secondary | ICD-10-CM | POA: Diagnosis not present

## 2017-02-02 DIAGNOSIS — I2510 Atherosclerotic heart disease of native coronary artery without angina pectoris: Secondary | ICD-10-CM | POA: Diagnosis not present

## 2017-02-02 DIAGNOSIS — I110 Hypertensive heart disease with heart failure: Secondary | ICD-10-CM | POA: Diagnosis not present

## 2017-02-02 DIAGNOSIS — J9611 Chronic respiratory failure with hypoxia: Secondary | ICD-10-CM | POA: Diagnosis not present

## 2017-02-02 DIAGNOSIS — E1151 Type 2 diabetes mellitus with diabetic peripheral angiopathy without gangrene: Secondary | ICD-10-CM | POA: Diagnosis not present

## 2017-02-05 DIAGNOSIS — I5043 Acute on chronic combined systolic (congestive) and diastolic (congestive) heart failure: Secondary | ICD-10-CM | POA: Diagnosis not present

## 2017-02-05 DIAGNOSIS — J449 Chronic obstructive pulmonary disease, unspecified: Secondary | ICD-10-CM | POA: Diagnosis not present

## 2017-02-05 DIAGNOSIS — I110 Hypertensive heart disease with heart failure: Secondary | ICD-10-CM | POA: Diagnosis not present

## 2017-02-05 DIAGNOSIS — E1151 Type 2 diabetes mellitus with diabetic peripheral angiopathy without gangrene: Secondary | ICD-10-CM | POA: Diagnosis not present

## 2017-02-05 DIAGNOSIS — J9611 Chronic respiratory failure with hypoxia: Secondary | ICD-10-CM | POA: Diagnosis not present

## 2017-02-05 DIAGNOSIS — I2510 Atherosclerotic heart disease of native coronary artery without angina pectoris: Secondary | ICD-10-CM | POA: Diagnosis not present

## 2017-02-08 DIAGNOSIS — I5043 Acute on chronic combined systolic (congestive) and diastolic (congestive) heart failure: Secondary | ICD-10-CM | POA: Diagnosis not present

## 2017-02-08 DIAGNOSIS — E1151 Type 2 diabetes mellitus with diabetic peripheral angiopathy without gangrene: Secondary | ICD-10-CM | POA: Diagnosis not present

## 2017-02-08 DIAGNOSIS — I2510 Atherosclerotic heart disease of native coronary artery without angina pectoris: Secondary | ICD-10-CM | POA: Diagnosis not present

## 2017-02-08 DIAGNOSIS — I110 Hypertensive heart disease with heart failure: Secondary | ICD-10-CM | POA: Diagnosis not present

## 2017-02-08 DIAGNOSIS — J9611 Chronic respiratory failure with hypoxia: Secondary | ICD-10-CM | POA: Diagnosis not present

## 2017-02-08 DIAGNOSIS — J449 Chronic obstructive pulmonary disease, unspecified: Secondary | ICD-10-CM | POA: Diagnosis not present

## 2017-02-11 DIAGNOSIS — J449 Chronic obstructive pulmonary disease, unspecified: Secondary | ICD-10-CM | POA: Diagnosis not present

## 2017-02-11 DIAGNOSIS — E1151 Type 2 diabetes mellitus with diabetic peripheral angiopathy without gangrene: Secondary | ICD-10-CM | POA: Diagnosis not present

## 2017-02-11 DIAGNOSIS — I5043 Acute on chronic combined systolic (congestive) and diastolic (congestive) heart failure: Secondary | ICD-10-CM | POA: Diagnosis not present

## 2017-02-11 DIAGNOSIS — J9611 Chronic respiratory failure with hypoxia: Secondary | ICD-10-CM | POA: Diagnosis not present

## 2017-02-11 DIAGNOSIS — I110 Hypertensive heart disease with heart failure: Secondary | ICD-10-CM | POA: Diagnosis not present

## 2017-02-11 DIAGNOSIS — I2510 Atherosclerotic heart disease of native coronary artery without angina pectoris: Secondary | ICD-10-CM | POA: Diagnosis not present

## 2017-02-12 DIAGNOSIS — I110 Hypertensive heart disease with heart failure: Secondary | ICD-10-CM | POA: Diagnosis not present

## 2017-02-12 DIAGNOSIS — I5043 Acute on chronic combined systolic (congestive) and diastolic (congestive) heart failure: Secondary | ICD-10-CM | POA: Diagnosis not present

## 2017-02-12 DIAGNOSIS — J449 Chronic obstructive pulmonary disease, unspecified: Secondary | ICD-10-CM | POA: Diagnosis not present

## 2017-02-12 DIAGNOSIS — J9611 Chronic respiratory failure with hypoxia: Secondary | ICD-10-CM | POA: Diagnosis not present

## 2017-02-12 DIAGNOSIS — I2510 Atherosclerotic heart disease of native coronary artery without angina pectoris: Secondary | ICD-10-CM | POA: Diagnosis not present

## 2017-02-12 DIAGNOSIS — E1151 Type 2 diabetes mellitus with diabetic peripheral angiopathy without gangrene: Secondary | ICD-10-CM | POA: Diagnosis not present

## 2017-02-15 DIAGNOSIS — I110 Hypertensive heart disease with heart failure: Secondary | ICD-10-CM | POA: Diagnosis not present

## 2017-02-15 DIAGNOSIS — E1151 Type 2 diabetes mellitus with diabetic peripheral angiopathy without gangrene: Secondary | ICD-10-CM | POA: Diagnosis not present

## 2017-02-15 DIAGNOSIS — J9611 Chronic respiratory failure with hypoxia: Secondary | ICD-10-CM | POA: Diagnosis not present

## 2017-02-15 DIAGNOSIS — J449 Chronic obstructive pulmonary disease, unspecified: Secondary | ICD-10-CM | POA: Diagnosis not present

## 2017-02-15 DIAGNOSIS — I2510 Atherosclerotic heart disease of native coronary artery without angina pectoris: Secondary | ICD-10-CM | POA: Diagnosis not present

## 2017-02-15 DIAGNOSIS — I5043 Acute on chronic combined systolic (congestive) and diastolic (congestive) heart failure: Secondary | ICD-10-CM | POA: Diagnosis not present

## 2017-02-16 DIAGNOSIS — H524 Presbyopia: Secondary | ICD-10-CM | POA: Diagnosis not present

## 2017-02-16 DIAGNOSIS — H52223 Regular astigmatism, bilateral: Secondary | ICD-10-CM | POA: Diagnosis not present

## 2017-02-16 DIAGNOSIS — G43809 Other migraine, not intractable, without status migrainosus: Secondary | ICD-10-CM | POA: Diagnosis not present

## 2017-02-16 DIAGNOSIS — J9611 Chronic respiratory failure with hypoxia: Secondary | ICD-10-CM | POA: Diagnosis not present

## 2017-02-16 DIAGNOSIS — I110 Hypertensive heart disease with heart failure: Secondary | ICD-10-CM | POA: Diagnosis not present

## 2017-02-16 DIAGNOSIS — I2510 Atherosclerotic heart disease of native coronary artery without angina pectoris: Secondary | ICD-10-CM | POA: Diagnosis not present

## 2017-02-16 DIAGNOSIS — H04123 Dry eye syndrome of bilateral lacrimal glands: Secondary | ICD-10-CM | POA: Diagnosis not present

## 2017-02-16 DIAGNOSIS — H5213 Myopia, bilateral: Secondary | ICD-10-CM | POA: Diagnosis not present

## 2017-02-16 DIAGNOSIS — E1151 Type 2 diabetes mellitus with diabetic peripheral angiopathy without gangrene: Secondary | ICD-10-CM | POA: Diagnosis not present

## 2017-02-16 DIAGNOSIS — I5043 Acute on chronic combined systolic (congestive) and diastolic (congestive) heart failure: Secondary | ICD-10-CM | POA: Diagnosis not present

## 2017-02-16 DIAGNOSIS — J449 Chronic obstructive pulmonary disease, unspecified: Secondary | ICD-10-CM | POA: Diagnosis not present

## 2017-02-16 DIAGNOSIS — H35373 Puckering of macula, bilateral: Secondary | ICD-10-CM | POA: Diagnosis not present

## 2017-02-17 DIAGNOSIS — I5043 Acute on chronic combined systolic (congestive) and diastolic (congestive) heart failure: Secondary | ICD-10-CM | POA: Diagnosis not present

## 2017-02-17 DIAGNOSIS — J9611 Chronic respiratory failure with hypoxia: Secondary | ICD-10-CM | POA: Diagnosis not present

## 2017-02-17 DIAGNOSIS — I2510 Atherosclerotic heart disease of native coronary artery without angina pectoris: Secondary | ICD-10-CM | POA: Diagnosis not present

## 2017-02-17 DIAGNOSIS — E1151 Type 2 diabetes mellitus with diabetic peripheral angiopathy without gangrene: Secondary | ICD-10-CM | POA: Diagnosis not present

## 2017-02-17 DIAGNOSIS — J449 Chronic obstructive pulmonary disease, unspecified: Secondary | ICD-10-CM | POA: Diagnosis not present

## 2017-02-17 DIAGNOSIS — I110 Hypertensive heart disease with heart failure: Secondary | ICD-10-CM | POA: Diagnosis not present

## 2017-02-23 DIAGNOSIS — E1151 Type 2 diabetes mellitus with diabetic peripheral angiopathy without gangrene: Secondary | ICD-10-CM | POA: Diagnosis not present

## 2017-02-23 DIAGNOSIS — I2510 Atherosclerotic heart disease of native coronary artery without angina pectoris: Secondary | ICD-10-CM | POA: Diagnosis not present

## 2017-02-23 DIAGNOSIS — J9611 Chronic respiratory failure with hypoxia: Secondary | ICD-10-CM | POA: Diagnosis not present

## 2017-02-23 DIAGNOSIS — I5043 Acute on chronic combined systolic (congestive) and diastolic (congestive) heart failure: Secondary | ICD-10-CM | POA: Diagnosis not present

## 2017-02-23 DIAGNOSIS — I110 Hypertensive heart disease with heart failure: Secondary | ICD-10-CM | POA: Diagnosis not present

## 2017-02-23 DIAGNOSIS — J310 Chronic rhinitis: Secondary | ICD-10-CM | POA: Diagnosis not present

## 2017-02-23 DIAGNOSIS — R42 Dizziness and giddiness: Secondary | ICD-10-CM | POA: Diagnosis not present

## 2017-02-23 DIAGNOSIS — J449 Chronic obstructive pulmonary disease, unspecified: Secondary | ICD-10-CM | POA: Diagnosis not present

## 2017-02-23 DIAGNOSIS — H903 Sensorineural hearing loss, bilateral: Secondary | ICD-10-CM | POA: Diagnosis not present

## 2017-03-02 DIAGNOSIS — I5043 Acute on chronic combined systolic (congestive) and diastolic (congestive) heart failure: Secondary | ICD-10-CM | POA: Diagnosis not present

## 2017-03-02 DIAGNOSIS — J449 Chronic obstructive pulmonary disease, unspecified: Secondary | ICD-10-CM | POA: Diagnosis not present

## 2017-03-02 DIAGNOSIS — I2510 Atherosclerotic heart disease of native coronary artery without angina pectoris: Secondary | ICD-10-CM | POA: Diagnosis not present

## 2017-03-02 DIAGNOSIS — I110 Hypertensive heart disease with heart failure: Secondary | ICD-10-CM | POA: Diagnosis not present

## 2017-03-02 DIAGNOSIS — J9611 Chronic respiratory failure with hypoxia: Secondary | ICD-10-CM | POA: Diagnosis not present

## 2017-03-02 DIAGNOSIS — E1151 Type 2 diabetes mellitus with diabetic peripheral angiopathy without gangrene: Secondary | ICD-10-CM | POA: Diagnosis not present

## 2017-03-09 DIAGNOSIS — J449 Chronic obstructive pulmonary disease, unspecified: Secondary | ICD-10-CM | POA: Diagnosis not present

## 2017-03-09 DIAGNOSIS — I5043 Acute on chronic combined systolic (congestive) and diastolic (congestive) heart failure: Secondary | ICD-10-CM | POA: Diagnosis not present

## 2017-03-09 DIAGNOSIS — E1151 Type 2 diabetes mellitus with diabetic peripheral angiopathy without gangrene: Secondary | ICD-10-CM | POA: Diagnosis not present

## 2017-03-09 DIAGNOSIS — I2510 Atherosclerotic heart disease of native coronary artery without angina pectoris: Secondary | ICD-10-CM | POA: Diagnosis not present

## 2017-03-09 DIAGNOSIS — J9611 Chronic respiratory failure with hypoxia: Secondary | ICD-10-CM | POA: Diagnosis not present

## 2017-03-09 DIAGNOSIS — I110 Hypertensive heart disease with heart failure: Secondary | ICD-10-CM | POA: Diagnosis not present

## 2017-03-12 ENCOUNTER — Ambulatory Visit (INDEPENDENT_AMBULATORY_CARE_PROVIDER_SITE_OTHER): Payer: Medicare Other | Admitting: Cardiovascular Disease

## 2017-03-12 ENCOUNTER — Encounter: Payer: Self-pay | Admitting: Cardiovascular Disease

## 2017-03-12 DIAGNOSIS — I5042 Chronic combined systolic (congestive) and diastolic (congestive) heart failure: Secondary | ICD-10-CM | POA: Diagnosis not present

## 2017-03-12 DIAGNOSIS — I6523 Occlusion and stenosis of bilateral carotid arteries: Secondary | ICD-10-CM

## 2017-03-12 DIAGNOSIS — I10 Essential (primary) hypertension: Secondary | ICD-10-CM

## 2017-03-12 DIAGNOSIS — R509 Fever, unspecified: Secondary | ICD-10-CM

## 2017-03-12 DIAGNOSIS — E785 Hyperlipidemia, unspecified: Secondary | ICD-10-CM

## 2017-03-12 DIAGNOSIS — R42 Dizziness and giddiness: Secondary | ICD-10-CM | POA: Diagnosis not present

## 2017-03-12 DIAGNOSIS — R002 Palpitations: Secondary | ICD-10-CM

## 2017-03-12 MED ORDER — METOPROLOL SUCCINATE ER 25 MG PO TB24: 25.0000 mg | ORAL_TABLET | Freq: Every day | ORAL | 5 refills | Status: DC

## 2017-03-12 NOTE — Progress Notes (Signed)
Cardiology Office Note   Date:  03/14/2017   ID:  Helen [name], DOB 25-Oct-1929, MRN [id number]  PCP:  Vernie Shanks, MD  Cardiologist:   Skeet Latch, MD   Chief Complaint  Patient presents with  . Follow-up     History of Present Illness: Helen [name] is a 83 y.o. female with chronic systolic and diastolic heart failure, hypertension, hyperlipidemia, diabetes, prior stroke, CAD, carotid stenosis and breast cancer s/p chemotherapy and double mastectomy who presents for follow up on heart failure.  Helen [name] was admitted 02/2016 with hyponatremia.  That hospitalization she had an echo that revealed LVEF LVEF 40-45% with global hypokinesis worse in the basal-mid anteroseptal and inferoseptal regions, moderately elevated pulmonary pressures, and grade 2 diastolic dysfunction.  She was not evaluated by cardiology at the time.  Since then she has remained on supplemental oxygen.  BNP was noted to be 1500, So she was referred to cardiology for evaluation 10/08/16. EKG was notable for diffuse T wave inversions. She was referred for [facility] 10/16/16 that revealed LVEF 30% with a fixed defect in the inferior wall felt to be diaphragmatic attenuation.  She was admitted 10/2016 with heart failure and acute on chronic respiratory failure. BNP was 1300. Cardiology was not consulted. She followed up with [name], PA-C on 12/08/16 and was referred for repeat echocardiogram. Echo revealed LVEF 25-30% with diffuse hypokinesis and elevated left ventricular filling pressures. She also had moderate pulmonary hypertension.  At her last appointment Helen [name] was started on [place]. She followed up with Bernerd Pho on 5/25 and her edema had improved.  However she complained of dizziness.  No changes were made at that time. However carvedilol was reduced to 3.125 mg.  Since that time she has been feeling fairly well.  She no longer has lower extremity edema.  She denies chest pain but has  some shortness of breath with exertion.  She continues to have episodes of "dizziness" that she describes as a heavy feeling in her head.  It occurs both sitting and standing.  Symptoms improve with laying down.  She denies pre-syncope or syncope.  For the last few days she notes mildly elevated temperature to 99-100 in the afternoon.  She denies dysuria or increased urination.  She endorses a dry cough.     Past Medical History:  Diagnosis Date  . Anemia   . Cancer [facility])    Breast cancer  . Chronic combined systolic (congestive) and diastolic (congestive) heart failure (HCC)    a. EF 25-30% by echo in 11/2016. Recent NST showing no inducible ischemia --> therefore thought to be most consistent with nonischemic cardiomyopathy.   . Diabetes mellitus   . History of stroke 11/2016   incidental finding on MRI  . Hypercholesteremia   . Hypertension   . Peripheral vascular disease ([place]) 2015   bilateral moderate CA disease-Dr Bridgett Larsson    Past Surgical History:  Procedure Laterality Date  . ABDOMINAL HYSTERECTOMY    . APPENDECTOMY  1951  . [place]  . CHOLECYSTECTOMY  2003   By Dr. Excell Seltzer  . EYE SURGERY Bilateral 2009   Cataract surgery  . FRACTURE SURGERY Right    ORIF   by Dr. Burney Gauze  . HUMERUS FRACTURE SURGERY    . MASTECTOMY Bilateral   . NASAL SEPTUM SURGERY  1977   Dr. Ernesto Rutherford  . TOENAIL EXCISION       Current Outpatient Prescriptions  Medication Sig  Dispense Refill  . atorvastatin (LIPITOR) 80 MG tablet Take 80 mg by mouth at bedtime.    [name] BIOTIN PO Take 1 tablet by mouth daily.    . busPIRone (BUSPAR) 7.5 MG tablet Take 7.5 mg by mouth 2 (two) times daily.    . cholecalciferol (VITAMIN D) 1000 units tablet Take 1,000 Units by mouth every evening.     . clopidogrel (PLAVIX) 75 MG tablet Take 75 mg by mouth daily.    . fluticasone (FLONASE) 50 MCG/ACT nasal spray Place 1 spray into both nostrils daily as needed for allergies or rhinitis.     .  furosemide (LASIX) 40 MG tablet Take 1 tablet (40 mg total) by mouth daily. 90 tablet 3  . meclizine (ANTIVERT) 25 MG tablet Take 1 tablet (25 mg total) by mouth 3 (three) times daily as needed for dizziness. 90 tablet 0  . Melatonin 10 MG TABS Take 10 mg by mouth at bedtime as needed (sleep).    . Multiple Vitamin (MULTIVITAMIN WITH MINERALS) TABS tablet Take 1 tablet by mouth daily.     [name] omeprazole (PRILOSEC) 20 MG capsule Take 20 mg by mouth daily.    . ondansetron (ZOFRAN) 4 MG tablet Take 4 mg by mouth every 8 (eight) hours as needed for nausea or vomiting.    . sacubitril-valsartan (ENTRESTO) 24-26 MG Take 1 tablet by mouth 2 (two) times daily. 180 tablet 3  . vitamin C (ASCORBIC ACID) 500 MG tablet Take 1,000 mg by mouth daily.    . metoprolol succinate (TOPROL XL) 25 MG 24 hr tablet Take 1 tablet (25 mg total) by mouth daily. 30 tablet 5   No current facility-administered medications for this visit.     Allergies:   Morphine and related; Other; Sulfa antibiotics; and Nickel    Social History:  The patient  reports that she quit smoking about 38 years ago. She has never used smokeless tobacco. She reports that she does not drink alcohol or use drugs.   Family History:  The patient's family history includes Cancer in her mother; Diabetes in her brother; Heart attack in her father; Heart disease in her father; Hyperlipidemia in her brother; Kidney disease in her brother; Pulmonary embolism in her mother; Stroke in her brother and father.    ROS:  Please see the history of present illness.   Otherwise, review of systems are positive for none.   All other systems are reviewed and negative.    PHYSICAL EXAM: VS:  BP (!) 142/72   Pulse 76   Ht 5\' 3"  (1.6 m)   Wt 60.2 kg (132 lb 12.8 oz)   BMI 23.52 kg/m  , BMI Body mass index is 23.52 kg/m. GENERAL:  Well appearing.  No acute distress. HEENT: No JVD.  Waveform within normal limits, carotid upstroke brisk and symmetric, no  bruits LUNGS:  Clear to auscultation bilaterally.  No crackles, wheezes, rhonchi. HEART:  RRR.  PMI not displaced or sustained,S1 and S2 within normal limits, no S3, no S4, no clicks, no rubs, no murmurs ABD:  Flat, positive bowel sounds normal in frequency in pitch, no bruits, no rebound, no guarding, no midline pulsatile mass, no hepatomegaly, no splenomegaly EXT:  2 plus pulses throughout, 2+ pitting edema to the mid tibia bilaterally, no cyanosis no clubbing SKIN:  No rashes no nodules NEURO:  Cranial nerves II through XII grossly intact, motor grossly intact throughout PSYCH:  Cognitively intact, oriented to person place and time  EKG:  EKG is not ordered today. The ekg ordered 10/08/16 demonstrates sinus rhythm rate 76 bpm.  PVCs.  Inferolateral TWI  12/21/16: Study Conclusions  - Left ventricle: The cavity size was normal. Wall thickness was   increased in a pattern of mild LVH. Systolic function was   severely reduced. The estimated ejection fraction was in the   range of 25% to 30%. Diffuse hypokinesis. Doppler parameters are   consistent with restrictive physiology, indicative of decreased   left ventricular diastolic compliance and/or increased left   atrial pressure. Doppler parameters are consistent with high   ventricular filling pressure. - Aortic valve: Valve mobility was restricted. There was trivial   regurgitation. - Mitral valve: Calcified annulus. Mildly thickened leaflets .   There was mild regurgitation. - Left atrium: The atrium was mildly dilated. - Right ventricle: The cavity size was mildly dilated. Systolic   function was mildly reduced. - Right atrium: The atrium was mildly dilated. - Pulmonary arteries: Systolic pressure was moderately increased.   PA peak pressure: 53 mm Hg (S). - Pericardium, extracardiac: A trivial pericardial effusion was   identified.  Carotid Doppler 12/15/15 and 11/06/16:  R and L ICA 1-39%.  L ECA >50%  Lexiscan Myoview  10/16/16:  The left ventricular ejection fraction is moderately decreased (30-44%).  Nuclear stress EF: 30%.  There is a medium defect of moderate severity present in the basal inferior, mid inferior and apical inferior location. The defect is non-reversible and likely secondary to diaphragmatic attenuation artifact and extracardiac tracer uptake as there is no focal wall motion abnormality in this area. No ischemia noted.  This is an intermediate risk study due to LV dysfunction.  There was downsloping ST segment depression in the inferolateral leads at baseline with no change during infusion.  Recent Labs: 11/17/2016: ALT 100; TSH 4.721 12/08/2016: Brain Natriuretic Peptide 2,247.2 03/12/2017: BUN 18; Creatinine, Ser 0.95; Hemoglobin 11.0; Magnesium 1.8; Platelets 289; Potassium 5.5; Sodium 133   09/07/16:  BNP 1500 Sodium 137, potassium 4.4, BUN 13, creatinine 0.78 AST 19, ALT 25 WBC 10.1, hemoglobin 13.1, hematocrit 39.7, platelets 240   Lipid Panel    Component Value Date/Time   CHOL 84 11/16/2016 0521   TRIG 91 11/16/2016 0521   HDL 18 (L) 11/16/2016 0521   CHOLHDL 4.7 11/16/2016 0521   VLDL 18 11/16/2016 0521   LDLCALC 48 11/16/2016 0521      Wt Readings from Last 3 Encounters:  03/12/17 60.2 kg (132 lb 12.8 oz)  01/15/17 61.7 kg (136 lb)  01/01/17 62.1 kg (136 lb 12.8 oz)      ASSESSMENT AND PLAN:  # Acute on chronic systolic and diastolic heart failure:  # Hypertension:  LVEF 25-30%.  No ischemia on Lexiscan Myoview.  She is doing well on Entresto.  Switch carvedilol to metoprolol 2/2 PACs.  # PACs: # Atrial bigeminy: Start metoprolol succinate 25mg  daily.  Stop carvedilol.  # Carotid stenosis: # Prior TIA:  Ms. Matson has moderate carotid stenosis.  Continue clopidogrel and atorvastatin.  LDL 48 10/2016.  # Dizziness: Restart meclizine.  Her symptoms do not seem cardiac as she has dizziness with normal BP.  Moderate carotid disease 10/2016.  # Fever: Ms.  Baillie has low grade fever and no localizing symptoms.  Check CBC and u/a.    Current medicines are reviewed at length with the patient today.  The patient does not have concerns regarding medicines.  The following changes have been made:  Switch carvedilol to metoprolol.  Labs/ tests ordered today include:   Orders Placed This Encounter  Procedures  . Microscopic Examination  . Urine Culture, Reflex  . CBC with Differential/Platelet  . Magnesium  . Basic metabolic panel  . UA/M w/rflx Culture, Routine     Disposition:   FU with Flor Whitacre C. Oval Linsey, MD, [facility] in 1 month   This note was written with the assistance of speech recognition software.  Please excuse any transcriptional errors.  Signed, Darchelle Nunes C. Oval Linsey, MD, [facility]  03/14/2017 9:13 PM    [place]

## 2017-03-12 NOTE — Patient Instructions (Addendum)
Medication Instructions:  STOP CARVEDILOL   START METOPROLOL SUCC 25 MG DAILY  Labwork: CBC/BMET/MAGNESIUM/URINALYSIS  Testing/Procedures: NONE  Follow-Up: Your physician recommends that you schedule a follow-up appointment in: [place] wants you to follow-up in: [place] will receive a reminder letter in the mail two months in advance. If you don't receive a letter, please call our office to schedule the follow-up appointment.  If you need a refill on your cardiac medications before your next appointment, please call your pharmacy.

## 2017-03-13 LAB — CBC WITH DIFFERENTIAL/PLATELET
Basophils Absolute: 0 10*3/uL (ref 0.0–0.2)
Basos: 0 %
EOS (ABSOLUTE): 0.6 10*3/uL — ABNORMAL HIGH (ref 0.0–0.4)
Eos: 6 %
Hematocrit: 33.8 % — ABNORMAL LOW (ref 34.0–46.6)
Hemoglobin: 11 g/dL — ABNORMAL LOW (ref 11.1–15.9)
Immature Grans (Abs): 0 10*3/uL (ref 0.0–0.1)
Immature Granulocytes: 0 %
Lymphocytes Absolute: 0.7 10*3/uL (ref 0.7–3.1)
Lymphs: 7 %
MCH: 26.4 pg — ABNORMAL LOW (ref 26.6–33.0)
MCHC: 32.5 g/dL (ref 31.5–35.7)
MCV: 81 fL (ref 79–97)
Monocytes Absolute: 1.4 10*3/uL — ABNORMAL HIGH (ref 0.1–0.9)
Monocytes: 13 %
Neutrophils Absolute: 7.5 10*3/uL — ABNORMAL HIGH (ref 1.4–7.0)
Neutrophils: 74 %
Platelets: 289 10*3/uL (ref 150–379)
RBC: 4.16 x10E6/uL (ref 3.77–5.28)
RDW: 16 % — ABNORMAL HIGH (ref 12.3–15.4)
WBC: 10.2 10*3/uL (ref 3.4–10.8)

## 2017-03-13 LAB — BASIC METABOLIC PANEL
BUN/Creatinine Ratio: 19 (ref 12–28)
BUN: 18 mg/dL (ref 8–27)
CO2: 26 mmol/L (ref 20–29)
Calcium: 8.9 mg/dL (ref 8.7–10.3)
Chloride: 89 mmol/L — ABNORMAL LOW (ref 96–106)
Creatinine, Ser: 0.95 mg/dL (ref 0.57–1.00)
GFR calc Af Amer: 63 mL/min/{1.73_m2} (ref >59)
GFR calc non Af Amer: 55 mL/min/{1.73_m2} — ABNORMAL LOW (ref >59)
Glucose: 102 mg/dL — ABNORMAL HIGH (ref 65–99)
Potassium: 5.5 mmol/L — ABNORMAL HIGH (ref 3.5–5.2)
Sodium: 133 mmol/L — ABNORMAL LOW (ref 134–144)

## 2017-03-13 LAB — MAGNESIUM: Magnesium: 1.8 mg/dL (ref 1.6–2.3)

## 2017-03-15 LAB — UA/M W/RFLX CULTURE, ROUTINE
Bilirubin, UA: NEGATIVE
Glucose, UA: NEGATIVE
Ketones, UA: NEGATIVE
Nitrite, UA: NEGATIVE
Protein, UA: NEGATIVE
RBC, UA: NEGATIVE
Specific Gravity, UA: 1.016 (ref 1.005–1.030)
Urobilinogen, Ur: 0.2 mg/dL (ref 0.2–1.0)
pH, UA: 6 (ref 5.0–7.5)

## 2017-03-15 LAB — MICROSCOPIC EXAMINATION: Casts: NONE SEEN /lpf

## 2017-03-15 LAB — URINE CULTURE, REFLEX

## 2017-03-16 ENCOUNTER — Telehealth: Payer: Self-pay | Admitting: *Deleted

## 2017-03-16 DIAGNOSIS — I110 Hypertensive heart disease with heart failure: Secondary | ICD-10-CM | POA: Diagnosis not present

## 2017-03-16 DIAGNOSIS — I2510 Atherosclerotic heart disease of native coronary artery without angina pectoris: Secondary | ICD-10-CM | POA: Diagnosis not present

## 2017-03-16 DIAGNOSIS — J449 Chronic obstructive pulmonary disease, unspecified: Secondary | ICD-10-CM | POA: Diagnosis not present

## 2017-03-16 DIAGNOSIS — J9611 Chronic respiratory failure with hypoxia: Secondary | ICD-10-CM | POA: Diagnosis not present

## 2017-03-16 DIAGNOSIS — Z79899 Other long term (current) drug therapy: Secondary | ICD-10-CM

## 2017-03-16 DIAGNOSIS — E875 Hyperkalemia: Secondary | ICD-10-CM

## 2017-03-16 DIAGNOSIS — I5043 Acute on chronic combined systolic (congestive) and diastolic (congestive) heart failure: Secondary | ICD-10-CM | POA: Diagnosis not present

## 2017-03-16 DIAGNOSIS — E1151 Type 2 diabetes mellitus with diabetic peripheral angiopathy without gangrene: Secondary | ICD-10-CM | POA: Diagnosis not present

## 2017-03-16 MED ORDER — AMOXICILLIN-POT CLAVULANATE 875-125 MG PO TABS: 1.0000 | ORAL_TABLET | Freq: Two times a day (BID) | ORAL | 0 refills | Status: DC

## 2017-03-16 NOTE — Telephone Encounter (Signed)
Advised patient of lab results, verbalized understanding Rx sent to [facility], mailed copy of lab order

## 2017-03-16 NOTE — Telephone Encounter (Signed)
-----   Message from Skeet Latch, MD sent at 03/15/2017  4:36 PM EDT ----- Urine is positive for infection.  Please order Augmentin 875/125 mg bid x10 days.  Potassium is still a little high.  This may be due to [facility].  It is not dangerously high, and I think that we should keep her on it and just make sure it isn't getting to high.  Repeat BMP in 2 months.

## 2017-03-17 ENCOUNTER — Telehealth: Payer: Self-pay | Admitting: Cardiovascular Disease

## 2017-03-17 NOTE — Telephone Encounter (Signed)
Advised to decrease potassium intake but does not need to cut out completely at this time

## 2017-03-17 NOTE — Telephone Encounter (Signed)
Pt said her lab results yesterday said her Potassium was high. Her question is should she cut back on the numbers off Bananas she eat a week,she eats 3 a week?

## 2017-03-18 ENCOUNTER — Telehealth: Payer: Self-pay | Admitting: Cardiovascular Disease

## 2017-03-18 NOTE — Telephone Encounter (Signed)
Returned the call to the patient. She has been made aware of the results, 102 for her glucose.

## 2017-03-18 NOTE — Telephone Encounter (Signed)
New message     Pt would like to know what her glucose is from lab work

## 2017-03-19 DIAGNOSIS — I272 Pulmonary hypertension, unspecified: Secondary | ICD-10-CM | POA: Diagnosis not present

## 2017-03-19 DIAGNOSIS — I5032 Chronic diastolic (congestive) heart failure: Secondary | ICD-10-CM | POA: Diagnosis not present

## 2017-03-19 DIAGNOSIS — R61 Generalized hyperhidrosis: Secondary | ICD-10-CM | POA: Diagnosis not present

## 2017-03-19 DIAGNOSIS — E119 Type 2 diabetes mellitus without complications: Secondary | ICD-10-CM | POA: Diagnosis not present

## 2017-03-19 DIAGNOSIS — N390 Urinary tract infection, site not specified: Secondary | ICD-10-CM | POA: Diagnosis not present

## 2017-03-19 DIAGNOSIS — E875 Hyperkalemia: Secondary | ICD-10-CM | POA: Diagnosis not present

## 2017-03-19 DIAGNOSIS — R769 Abnormal immunological finding in serum, unspecified: Secondary | ICD-10-CM | POA: Diagnosis not present

## 2017-04-05 ENCOUNTER — Other Ambulatory Visit: Payer: Self-pay | Admitting: *Deleted

## 2017-04-05 DIAGNOSIS — R002 Palpitations: Secondary | ICD-10-CM

## 2017-04-06 MED ORDER — METOPROLOL SUCCINATE ER 25 MG PO TB24: 25.0000 mg | ORAL_TABLET | Freq: Every day | ORAL | 5 refills | Status: DC

## 2017-04-06 NOTE — Telephone Encounter (Signed)
REFILL 

## 2017-04-09 ENCOUNTER — Encounter: Payer: Self-pay | Admitting: Physician Assistant

## 2017-04-09 ENCOUNTER — Ambulatory Visit (INDEPENDENT_AMBULATORY_CARE_PROVIDER_SITE_OTHER): Payer: Medicare Other | Admitting: Physician Assistant

## 2017-04-09 VITALS — BP 137/65 | HR 78 | Ht 63.0 in | Wt 130.8 lb

## 2017-04-09 DIAGNOSIS — I10 Essential (primary) hypertension: Secondary | ICD-10-CM | POA: Diagnosis not present

## 2017-04-09 DIAGNOSIS — I2510 Atherosclerotic heart disease of native coronary artery without angina pectoris: Secondary | ICD-10-CM | POA: Diagnosis not present

## 2017-04-09 DIAGNOSIS — E785 Hyperlipidemia, unspecified: Secondary | ICD-10-CM

## 2017-04-09 DIAGNOSIS — E875 Hyperkalemia: Secondary | ICD-10-CM

## 2017-04-09 DIAGNOSIS — R002 Palpitations: Secondary | ICD-10-CM

## 2017-04-09 DIAGNOSIS — I6523 Occlusion and stenosis of bilateral carotid arteries: Secondary | ICD-10-CM

## 2017-04-09 DIAGNOSIS — E119 Type 2 diabetes mellitus without complications: Secondary | ICD-10-CM | POA: Diagnosis not present

## 2017-04-09 DIAGNOSIS — I779 Disorder of arteries and arterioles, unspecified: Secondary | ICD-10-CM

## 2017-04-09 DIAGNOSIS — I5042 Chronic combined systolic (congestive) and diastolic (congestive) heart failure: Secondary | ICD-10-CM

## 2017-04-09 DIAGNOSIS — I739 Peripheral vascular disease, unspecified: Secondary | ICD-10-CM

## 2017-04-09 MED ORDER — METOPROLOL SUCCINATE ER 25 MG PO TB24: 37.5000 mg | ORAL_TABLET | Freq: Every day | ORAL | 5 refills | Status: DC

## 2017-04-09 MED ORDER — METOPROLOL SUCCINATE ER 25 MG PO TB24: 37.5000 mg | ORAL_TABLET | Freq: Every day | ORAL | 1 refills | Status: DC

## 2017-04-09 NOTE — Patient Instructions (Signed)
Medication Instructions:   INCREASE Metoprolol Succinate (Toprol XL) to 37.5mg  DAILY  There is not a 37.5mg  tablet, please take 1 and 1/2 of your current 25mg  tablets to equal this dose. A prescription for the new dose and tablet count has been sent to your pharmacy.  Labwork:   BMET in 1 month (non-fasting test) in our office.  Testing/Procedures:  none  Follow-Up:  With Dr. Oval Linsey in 4 months.   If you need a refill on your cardiac medications before your next appointment, please call your pharmacy.

## 2017-04-09 NOTE — Progress Notes (Signed)
Cardiology Office Note    Date:  04/11/2017   ID:  Helen [name], DOB 1930-05-13, MRN [id number]  PCP:  Vernie Shanks, MD  Cardiologist:  Dr. Oval Linsey   Chief Complaint  Patient presents with  . Follow-up    seen for Dr. Oval Linsey    History of Present Illness:  Helen [name] is a 83 y.o. female with PMH of combined systolic and diastolic heart failure, HTN, HLD, DM II, prior stroke, CAD, carotid stenosis and breast CA s/p chemotherapy and double mastectomy. She was admitted in July 2017 with hyponatremia. During the hospitalization she had echocardiogram which showed EF 40-45% with global hypokinesis worse in the basal mid anteroseptal and inferoseptal region, moderately elevated pulmonary pressure, and a grade 2 diastolic dysfunction. She was not evaluated by cardiology at the time. She was referred to cardiology for evaluation on 09/28/2016, EKG was notable for diffuse T-wave inversion. She was referred for [facility] on 10/16/2016 which revealed EF 30% with fixed defect in the inferior wall felt to be diaphragmatic attenuation. She was readmitted in March 2018 with heart failure and acute on chronic respiratory failure. BNP was 1300. Cardiology again was not consulted. Repeat echocardiogram in April 2018 showed EF 25-30% with diffuse hypokinesis and elevated after ventricular filling pressure, moderate pulmonary hypertension. She had an event monitor in June 2018 which showed predominantly sinus rhythm, average heart rate 80s per minute, frequent PACs and atrial bigeminy  She was last seen by Dr. Oval Linsey on 03/12/2017, her carvedilol was discontinued and switched to Toprol-XL 25 mg daily. Patient presents today for cardiology office visit. She is doing well from cardiology perspective. She denies any recent lower extremity edema, orthopnea or PND. Her blood pressure is tolerating Entresto and Toprol-XL. Her blood pressure is still mildly high, I will increase Toprol-XL to one half  tablet. Otherwise, she continued to have dizziness. She is using 1 L oxygen at home only during exertion and at night. She is due for pacing metabolic panel next month to follow up on potassium level. I am unable to add spironolactone at this time due to high potassium level. She is no longer eating banana. She wanted to wean off the oxygen, we did ambulate in the hallway with pulse ox, initially, she is able to maintain 92-95% oxygen, however during the third lab, her O2 saturation began to dip down to the 87 and 88%. I asked her to continue to wear her oxygen when she walk long distances and at night. To this day, she continued to have intermittent dizziness, however there is no explainable cause for this. We will avoid dropping her blood pressure too low, does not appears to be orthostatic in nature based on presentation.   Past Medical History:  Diagnosis Date  . Anemia   . Cancer [facility])    Breast cancer  . Chronic combined systolic (congestive) and diastolic (congestive) heart failure (HCC)    a. EF 25-30% by echo in 11/2016. Recent NST showing no inducible ischemia --> therefore thought to be most consistent with nonischemic cardiomyopathy.   . Diabetes mellitus   . History of stroke 11/2016   incidental finding on MRI  . Hypercholesteremia   . Hypertension   . Peripheral vascular disease ([place]) 2015   bilateral moderate CA disease-Dr Bridgett Larsson    Past Surgical History:  Procedure Laterality Date  . ABDOMINAL HYSTERECTOMY    . APPENDECTOMY  1951  . [place]  . CHOLECYSTECTOMY  2003  By Dr. Excell Seltzer  . EYE SURGERY Bilateral 2009   Cataract surgery  . FRACTURE SURGERY Right    ORIF   by Dr. Burney Gauze  . HUMERUS FRACTURE SURGERY    . MASTECTOMY Bilateral   . NASAL SEPTUM SURGERY  1977   Dr. Ernesto Rutherford  . TOENAIL EXCISION      Current Medications: Outpatient Medications Prior to Visit  Medication Sig Dispense Refill  . atorvastatin (LIPITOR) 80 MG tablet Take 80  mg by mouth at bedtime.    [name] BIOTIN PO Take 1 tablet by mouth daily.    . busPIRone (BUSPAR) 7.5 MG tablet Take 7.5 mg by mouth daily.     . cholecalciferol (VITAMIN D) 1000 units tablet Take 1,000 Units by mouth every evening.     . clopidogrel (PLAVIX) 75 MG tablet Take 75 mg by mouth daily.    . fluticasone (FLONASE) 50 MCG/ACT nasal spray Place 1 spray into both nostrils daily as needed for allergies or rhinitis.     . furosemide (LASIX) 40 MG tablet Take 1 tablet (40 mg total) by mouth daily. 90 tablet 3  . meclizine (ANTIVERT) 25 MG tablet Take 1 tablet (25 mg total) by mouth 3 (three) times daily as needed for dizziness. 90 tablet 0  . Multiple Vitamin (MULTIVITAMIN WITH MINERALS) TABS tablet Take 1 tablet by mouth daily.     [name] omeprazole (PRILOSEC) 20 MG capsule Take 20 mg by mouth daily.    . sacubitril-valsartan (ENTRESTO) 24-26 MG Take 1 tablet by mouth 2 (two) times daily. 180 tablet 3  . vitamin C (ASCORBIC ACID) 500 MG tablet Take 1,000 mg by mouth daily.    . metoprolol succinate (TOPROL XL) 25 MG 24 hr tablet Take 1 tablet (25 mg total) by mouth daily. 30 tablet 5  . Melatonin 10 MG TABS Take 10 mg by mouth at bedtime as needed (sleep).    . ondansetron (ZOFRAN) 4 MG tablet Take 4 mg by mouth every 8 (eight) hours as needed for nausea or vomiting.    [name] amoxicillin-clavulanate (AUGMENTIN) 875-125 MG tablet Take 1 tablet by mouth 2 (two) times daily. 20 tablet 0   No facility-administered medications prior to visit.      Allergies:   Morphine and related; Other; Sulfa antibiotics; Sulfasalazine; and Nickel   Social History   Social History  . Marital status: Widowed    Spouse name: N/A  . Number of children: N/A  . Years of education: N/A   Social History Main Topics  . Smoking status: Former Smoker    Quit date: 08/24/1978  . Smokeless tobacco: Never Used  . Alcohol use No  . Drug use: No  . Sexual activity: Not Asked   Other Topics Concern  . None   Social  History Narrative  . None     Family History:  The patient's family history includes Cancer in her mother; Diabetes in her brother; Heart attack in her father; Heart disease in her father; Hyperlipidemia in her brother; Kidney disease in her brother; Pulmonary embolism in her mother; Stroke in her brother and father.   ROS:   Please see the history of present illness.    ROS All other systems reviewed and are negative.   PHYSICAL EXAM:   VS:  BP 137/65   Pulse 78   Ht 5\' 3"  (1.6 m)   Wt 130 lb 12.8 oz (59.3 kg)   BMI 23.17 kg/m    GEN: Well nourished, well developed, in  no acute distress  HEENT: normal  Neck: no JVD, carotid bruits, or masses Cardiac: RRR; no murmurs, rubs, or gallops,no edema  Respiratory:  clear to auscultation bilaterally, normal work of breathing GI: soft, nontender, nondistended, + BS MS: no deformity or atrophy  Skin: warm and dry, no rash Neuro:  Alert and Oriented x 3, Strength and sensation are intact Psych: euthymic mood, full affect  Wt Readings from Last 3 Encounters:  04/09/17 130 lb 12.8 oz (59.3 kg)  03/12/17 132 lb 12.8 oz (60.2 kg)  01/15/17 136 lb (61.7 kg)      Studies/Labs Reviewed:   EKG:  EKG is not ordered today.    Recent Labs: 11/17/2016: ALT 100; TSH 4.721 12/08/2016: Brain Natriuretic Peptide 2,247.2 03/12/2017: BUN 18; Creatinine, Ser 0.95; Hemoglobin 11.0; Magnesium 1.8; Platelets 289; Potassium 5.5; Sodium 133   Lipid Panel    Component Value Date/Time   CHOL 84 11/16/2016 0521   TRIG 91 11/16/2016 0521   HDL 18 (L) 11/16/2016 0521   CHOLHDL 4.7 11/16/2016 0521   VLDL 18 11/16/2016 0521   LDLCALC 48 11/16/2016 0521    Additional studies/ records that were reviewed today include:   Event monitor 01/29/2017 30 Day Event Monitor  Quality: Fair.  Baseline artifact. Predominant rhythm: sinus rhythm Average heart rate: 88 bpm  Frequent PACs and atrial bigeminy. Occasional PVCs    ASSESSMENT:    1. Chronic  combined systolic and diastolic heart failure (HCC)   2. Palpitations   3. Hyperkalemia   4. Essential hypertension   5. Hyperlipidemia, unspecified hyperlipidemia type   6. Controlled type 2 diabetes mellitus without complication, without long-term current use of insulin ([place])   7. Coronary artery disease involving native coronary artery of native heart without angina pectoris   8. Carotid artery disease, unspecified laterality ([place])      PLAN:  In order of problems listed above:  1. Chronic combined systolic and diastolic heart failure: Recent echocardiogram showed EF 25%. Will increase Toprol-XL to 37.5 mg daily. Continue on Entresto. Unable to add Spironolactone at this time due to elevated potassium level. Despite worsening ejection fraction, denies any chest pain. No ischemia on Lexiscan Myoview.  2. Hyperkalemia: Recent lab work shows potassium level 5.5. Plan to repeat basic metabolic panel next month.  3. Hypertension: Blood pressure continued to be mildly elevated, previous monitor showed atrial bigeminy, increase Toprol-XL to 37.5 mg daily. Unable to add spironolactone due to hyperkalemia.  4. Hyperlipidemia: History of TIA, on Plavix and Lipitor 80 mg daily  5. Carotid artery disease: Last carotid ultrasound obtained on 11/06/2016 showed moderate blockage in bilateral carotid arteries.  6. Dizziness: Continued to have intermittent dizziness, unclear cause     Medication Adjustments/Labs and Tests Ordered: Current medicines are reviewed at length with the patient today.  Concerns regarding medicines are outlined above.  Medication changes, Labs and Tests ordered today are listed in the Patient Instructions below. Patient Instructions  Medication Instructions:   INCREASE Metoprolol Succinate (Toprol XL) to 37.5mg  DAILY  There is not a 37.5mg  tablet, please take 1 and 1/2 of your current 25mg  tablets to equal this dose. A prescription for the new dose and tablet count has  been sent to your pharmacy.  Labwork:   BMET in 1 month (non-fasting test) in our office.  Testing/Procedures:  none  Follow-Up:  With Dr. Oval Linsey in 4 months.   If you need a refill on your cardiac medications before your next appointment, please call your pharmacy.  Hilbert Corrigan, [place]  04/11/2017 11:44 AM    [address], [place], [place]  [address] Phone: [phone]; Fax: [phone]

## 2017-04-11 ENCOUNTER — Encounter: Payer: Self-pay | Admitting: Physician Assistant

## 2017-04-11 ENCOUNTER — Other Ambulatory Visit: Payer: Self-pay | Admitting: Cardiovascular Disease

## 2017-04-12 NOTE — Telephone Encounter (Signed)
Please review for refill, Thanks !  

## 2017-05-07 DIAGNOSIS — E875 Hyperkalemia: Secondary | ICD-10-CM | POA: Diagnosis not present

## 2017-05-08 LAB — BASIC METABOLIC PANEL
BUN/Creatinine Ratio: 25 (ref 12–28)
BUN: 22 mg/dL (ref 8–27)
CO2: 28 mmol/L (ref 20–29)
Calcium: 9.6 mg/dL (ref 8.7–10.3)
Chloride: 93 mmol/L — ABNORMAL LOW (ref 96–106)
Creatinine, Ser: 0.89 mg/dL (ref 0.57–1.00)
GFR calc Af Amer: 68 mL/min/{1.73_m2} (ref >59)
GFR calc non Af Amer: 59 mL/min/{1.73_m2} — ABNORMAL LOW (ref >59)
Glucose: 97 mg/dL (ref 65–99)
Potassium: 4.1 mmol/L (ref 3.5–5.2)
Sodium: 136 mmol/L (ref 134–144)

## 2017-05-21 DIAGNOSIS — E119 Type 2 diabetes mellitus without complications: Secondary | ICD-10-CM | POA: Diagnosis not present

## 2017-05-21 DIAGNOSIS — R42 Dizziness and giddiness: Secondary | ICD-10-CM | POA: Diagnosis not present

## 2017-05-21 DIAGNOSIS — R5381 Other malaise: Secondary | ICD-10-CM | POA: Diagnosis not present

## 2017-05-21 DIAGNOSIS — I498 Other specified cardiac arrhythmias: Secondary | ICD-10-CM | POA: Diagnosis not present

## 2017-05-21 DIAGNOSIS — E875 Hyperkalemia: Secondary | ICD-10-CM | POA: Diagnosis not present

## 2017-05-21 DIAGNOSIS — Z23 Encounter for immunization: Secondary | ICD-10-CM | POA: Diagnosis not present

## 2017-05-21 DIAGNOSIS — I890 Lymphedema, not elsewhere classified: Secondary | ICD-10-CM | POA: Diagnosis not present

## 2017-05-21 DIAGNOSIS — I509 Heart failure, unspecified: Secondary | ICD-10-CM | POA: Diagnosis not present

## 2017-05-24 ENCOUNTER — Telehealth: Payer: Self-pay | Admitting: Cardiovascular Disease

## 2017-05-24 NOTE — Telephone Encounter (Signed)
Returned call to Dorian Pod (daughter) regarding lower BP. She reports her mom's BP has dipped a couple of times in the last few weeks. Her metoprolol succinate was increased to 37.5mg  BID at last visit. Over the weekend she wasn't feeling the best for about 1.5 days but this is not "bad" for her.   Advised that we would need to see a trend of her home BP & HR readings to determine if a change is necessary. She will have her mother monitor home BP no more than twice daily (1 hour after taking her entresto) and call back with readings.

## 2017-05-24 NOTE — Telephone Encounter (Signed)
New message  Pt daughter verbalized that she is calling for the rn   Pt seen PCP seen pt and she said he said that the PCP is low and she has some questions   Pt c/o medication issue:  1. Name of Medication: metoprolol   2. How are you currently taking this medication (dosage and times per day)? 1.5mg  1x day  3. Are you having a reaction (difficulty breathing--STAT)? no  4. What is your medication issue? bp low

## 2017-05-31 ENCOUNTER — Telehealth: Payer: Self-pay | Admitting: Cardiovascular Disease

## 2017-05-31 NOTE — Telephone Encounter (Signed)
Please call,she needs to give you an update on her blood pressure readings.

## 2017-05-31 NOTE — Telephone Encounter (Signed)
Pt states that she was to take her BP for a week and call it in:                   AM HR O2 PM        HR O2 05-24-17    106/52  79  97% 10-2  118/51 79 98 124/54 88    94 10-3  105/48 86 99 110/54 84    98 10-4  112/53 81 98 126/66 85    97 10-5  99/46 76 95 110/76 76    95 10-6  91/41 68 88 126/57 76  100 10-7  116/51 76 92 122/59 80     95 Today  94/56 77 97

## 2017-05-31 NOTE — Telephone Encounter (Signed)
Spoke patient and she stated her PCP d/c her Melatonin and decrease her Vitamin C to 500 mg daily. Since then she has been feeling much better. She does get a little lightheaded/dizzy at times with lower blood pressure or when standing. Stated overall she is feeling best she has felt in quite a while. Will forward to Dr Oval Linsey for review

## 2017-06-01 NOTE — Telephone Encounter (Signed)
Left message to call back  

## 2017-06-01 NOTE — Telephone Encounter (Signed)
Helen [name] is returning your call. Thanks

## 2017-06-01 NOTE — Telephone Encounter (Signed)
Glad she is feeling well.  BP looks good but I do worry about the low BP given her dizziness.  Reduce metoprolol to 25mg  from 37.5.

## 2017-06-01 NOTE — Telephone Encounter (Signed)
Advised patient, verbalized understanding  

## 2017-06-28 DIAGNOSIS — J449 Chronic obstructive pulmonary disease, unspecified: Secondary | ICD-10-CM | POA: Diagnosis not present

## 2017-06-28 DIAGNOSIS — R2689 Other abnormalities of gait and mobility: Secondary | ICD-10-CM | POA: Diagnosis not present

## 2017-06-29 DIAGNOSIS — J449 Chronic obstructive pulmonary disease, unspecified: Secondary | ICD-10-CM | POA: Diagnosis not present

## 2017-06-29 DIAGNOSIS — R2689 Other abnormalities of gait and mobility: Secondary | ICD-10-CM | POA: Diagnosis not present

## 2017-07-01 DIAGNOSIS — J449 Chronic obstructive pulmonary disease, unspecified: Secondary | ICD-10-CM | POA: Diagnosis not present

## 2017-07-01 DIAGNOSIS — R2689 Other abnormalities of gait and mobility: Secondary | ICD-10-CM | POA: Diagnosis not present

## 2017-07-06 DIAGNOSIS — R2689 Other abnormalities of gait and mobility: Secondary | ICD-10-CM | POA: Diagnosis not present

## 2017-07-06 DIAGNOSIS — J449 Chronic obstructive pulmonary disease, unspecified: Secondary | ICD-10-CM | POA: Diagnosis not present

## 2017-07-07 DIAGNOSIS — J449 Chronic obstructive pulmonary disease, unspecified: Secondary | ICD-10-CM | POA: Diagnosis not present

## 2017-07-07 DIAGNOSIS — R2689 Other abnormalities of gait and mobility: Secondary | ICD-10-CM | POA: Diagnosis not present

## 2017-07-08 DIAGNOSIS — J449 Chronic obstructive pulmonary disease, unspecified: Secondary | ICD-10-CM | POA: Diagnosis not present

## 2017-07-08 DIAGNOSIS — R2689 Other abnormalities of gait and mobility: Secondary | ICD-10-CM | POA: Diagnosis not present

## 2017-07-14 DIAGNOSIS — J449 Chronic obstructive pulmonary disease, unspecified: Secondary | ICD-10-CM | POA: Diagnosis not present

## 2017-07-14 DIAGNOSIS — R2689 Other abnormalities of gait and mobility: Secondary | ICD-10-CM | POA: Diagnosis not present

## 2017-07-20 DIAGNOSIS — R2689 Other abnormalities of gait and mobility: Secondary | ICD-10-CM | POA: Diagnosis not present

## 2017-07-20 DIAGNOSIS — J449 Chronic obstructive pulmonary disease, unspecified: Secondary | ICD-10-CM | POA: Diagnosis not present

## 2017-07-21 DIAGNOSIS — R2689 Other abnormalities of gait and mobility: Secondary | ICD-10-CM | POA: Diagnosis not present

## 2017-07-21 DIAGNOSIS — J449 Chronic obstructive pulmonary disease, unspecified: Secondary | ICD-10-CM | POA: Diagnosis not present

## 2017-07-23 DIAGNOSIS — R2689 Other abnormalities of gait and mobility: Secondary | ICD-10-CM | POA: Diagnosis not present

## 2017-07-23 DIAGNOSIS — J449 Chronic obstructive pulmonary disease, unspecified: Secondary | ICD-10-CM | POA: Diagnosis not present

## 2017-07-26 DIAGNOSIS — J449 Chronic obstructive pulmonary disease, unspecified: Secondary | ICD-10-CM | POA: Diagnosis not present

## 2017-07-26 DIAGNOSIS — R2689 Other abnormalities of gait and mobility: Secondary | ICD-10-CM | POA: Diagnosis not present

## 2017-07-29 DIAGNOSIS — R2689 Other abnormalities of gait and mobility: Secondary | ICD-10-CM | POA: Diagnosis not present

## 2017-07-29 DIAGNOSIS — J449 Chronic obstructive pulmonary disease, unspecified: Secondary | ICD-10-CM | POA: Diagnosis not present

## 2017-07-30 ENCOUNTER — Ambulatory Visit (INDEPENDENT_AMBULATORY_CARE_PROVIDER_SITE_OTHER): Payer: Medicare Other | Admitting: Cardiovascular Disease

## 2017-07-30 ENCOUNTER — Encounter: Payer: Self-pay | Admitting: Cardiovascular Disease

## 2017-07-30 VITALS — BP 112/64 | HR 83 | Ht 63.0 in | Wt 139.0 lb

## 2017-07-30 DIAGNOSIS — I6523 Occlusion and stenosis of bilateral carotid arteries: Secondary | ICD-10-CM

## 2017-07-30 DIAGNOSIS — I2510 Atherosclerotic heart disease of native coronary artery without angina pectoris: Secondary | ICD-10-CM

## 2017-07-30 DIAGNOSIS — I10 Essential (primary) hypertension: Secondary | ICD-10-CM | POA: Diagnosis not present

## 2017-07-30 DIAGNOSIS — I5042 Chronic combined systolic (congestive) and diastolic (congestive) heart failure: Secondary | ICD-10-CM | POA: Diagnosis not present

## 2017-07-30 DIAGNOSIS — I491 Atrial premature depolarization: Secondary | ICD-10-CM

## 2017-07-30 DIAGNOSIS — E785 Hyperlipidemia, unspecified: Secondary | ICD-10-CM | POA: Diagnosis not present

## 2017-07-30 MED ORDER — METOPROLOL SUCCINATE ER 25 MG PO TB24: 25.0000 mg | ORAL_TABLET | Freq: Every day | ORAL | 3 refills | Status: DC

## 2017-07-30 NOTE — Progress Notes (Signed)
Cardiology Office Note   Date:  07/30/2017   ID:  Helen [name], DOB 06/12/1930, MRN [id number]  PCP:  Vernie Shanks, MD  Cardiologist:   Skeet Latch, MD   Chief Complaint  Patient presents with  . Follow-up    4 months  . Headache     History of Present Illness: Helen [name] is a 83 y.o. female with chronic systolic and diastolic heart failure, hypertension, hyperlipidemia, diabetes, prior stroke, CAD, carotid stenosis and breast cancer s/p chemotherapy and double mastectomy who presents for follow up on heart failure.  [name] was admitted 02/2016 with hyponatremia.  That hospitalization she had an echo that revealed LVEF LVEF 40-45% with global hypokinesis worse in the basal-mid anteroseptal and inferoseptal regions, moderately elevated pulmonary pressures, and grade 2 diastolic dysfunction.  She was not evaluated by cardiology at the time.  Since then she has remained on supplemental oxygen.  BNP was noted to be 1500, So she was referred to cardiology for evaluation 10/08/16. EKG was notable for diffuse T wave inversions. She was referred for [facility] 10/16/16 that revealed LVEF 30% with a fixed defect in the inferior wall felt to be diaphragmatic attenuation.  She was admitted 10/2016 with heart failure and acute on chronic respiratory failure. BNP was 1300. Cardiology was not consulted. She followed up with [name], PA-C on 12/08/16 and was referred for repeat echocardiogram. Echo revealed LVEF 25-30% with diffuse hypokinesis and elevated left ventricular filling pressures. She also had moderate pulmonary hypertension.  She was started on Entresto and had improvement in edema.    At her last appointment Ms. Waldren complained of dizziness.  Metoprolol was reduced to 25mg  and her symptoms have improved.  She is feeling great.  She participated in physical therapy and her balance has improved.  She no longer uses supplemental oxygen and she is able to walk up to 9  minutes without shortness of breath.  She denies lower extremity edema, orthopnea, or PND.  She is excited because her granddaughter is getting married in [place] and she plans to travel for the wedding.  Past Medical History:  Diagnosis Date  . Anemia   . Cancer [facility])    Breast cancer  . Chronic combined systolic (congestive) and diastolic (congestive) heart failure (HCC)    a. EF 25-30% by echo in 11/2016. Recent NST showing no inducible ischemia --> therefore thought to be most consistent with nonischemic cardiomyopathy.   . Diabetes mellitus   . History of stroke 11/2016   incidental finding on MRI  . Hypercholesteremia   . Hypertension   . Peripheral vascular disease ([place]) 2015   bilateral moderate CA disease-Dr Bridgett Larsson    Past Surgical History:  Procedure Laterality Date  . ABDOMINAL HYSTERECTOMY    . APPENDECTOMY  1951  . [place]  . CHOLECYSTECTOMY  2003   By Dr. Excell Seltzer  . EYE SURGERY Bilateral 2009   Cataract surgery  . FRACTURE SURGERY Right    ORIF   by Dr. Burney Gauze  . HUMERUS FRACTURE SURGERY    . MASTECTOMY Bilateral   . NASAL SEPTUM SURGERY  1977   Dr. Ernesto Rutherford  . TOENAIL EXCISION       Current Outpatient Medications  Medication Sig Dispense Refill  . atorvastatin (LIPITOR) 80 MG tablet Take 80 mg by mouth at bedtime.    [name] BIOTIN PO Take 1 tablet by mouth daily.    . cholecalciferol (VITAMIN D) 1000  units tablet Take 1,000 Units by mouth every evening.     . clopidogrel (PLAVIX) 75 MG tablet Take 75 mg by mouth daily.    . fluticasone (FLONASE) 50 MCG/ACT nasal spray Place 1 spray into both nostrils daily as needed for allergies or rhinitis.     . furosemide (LASIX) 40 MG tablet Take 1 tablet (40 mg total) by mouth daily. 90 tablet 3  . metoprolol succinate (TOPROL-XL) 25 MG 24 hr tablet Take 1 tablet (25 mg total) by mouth daily. 90 tablet 3  . Multiple Vitamin (MULTIVITAMIN WITH MINERALS) TABS tablet Take 1 tablet by mouth daily.      [name] omeprazole (PRILOSEC) 20 MG capsule Take 20 mg by mouth daily.    . ondansetron (ZOFRAN) 4 MG tablet Take 4 mg by mouth every 8 (eight) hours as needed for nausea or vomiting.    . sacubitril-valsartan (ENTRESTO) 24-26 MG Take 1 tablet by mouth 2 (two) times daily. 180 tablet 3  . vitamin C (ASCORBIC ACID) 500 MG tablet Take 500 mg by mouth daily.      No current facility-administered medications for this visit.     Allergies:   Morphine and related; Other; Sulfa antibiotics; Sulfasalazine; and Nickel    Social History:  The patient  reports that she quit smoking about 38 years ago. she has never used smokeless tobacco. She reports that she does not drink alcohol or use drugs.   Family History:  The patient's family history includes Cancer in her mother; Diabetes in her brother; Heart attack in her father; Heart disease in her father; Hyperlipidemia in her brother; Kidney disease in her brother; Pulmonary embolism in her mother; Stroke in her brother and father.    ROS:  Please see the history of present illness.   Otherwise, review of systems are positive for none.   All other systems are reviewed and negative.    PHYSICAL EXAM: VS:  BP 112/64   Pulse 83   Ht 5\' 3"  (1.6 m)   Wt 139 lb (63 kg)   BMI 24.62 kg/m  , BMI Body mass index is 24.62 kg/m. GENERAL:  Well appearing HEENT: Pupils equal round and reactive, fundi not visualized, oral mucosa unremarkable NECK:  No jugular venous distention, waveform within normal limits, carotid upstroke brisk and symmetric, no bruits, no thyromegaly LUNGS:  Clear to auscultation bilaterally HEART:  RRR.  PMI not displaced or sustained,S1 and S2 within normal limits, no S3, no S4, no clicks, no rubs, no murmurs ABD:  Flat, positive bowel sounds normal in frequency in pitch, no bruits, no rebound, no guarding, no midline pulsatile mass, no hepatomegaly, no splenomegaly EXT:  2 plus pulses throughout, no edema, no cyanosis no clubbing SKIN:   No rashes no nodules NEURO:  Cranial nerves II through XII grossly intact, motor grossly intact throughout PSYCH:  Cognitively intact, oriented to person place and time   EKG:  EKG is ordered today. The ekg ordered 10/08/16 demonstrates sinus rhythm rate 76 bpm.  PVCs.  Inferolateral TWI 07/30/17: Sinus rhythm.  Rate 83 bpm.  First degree AV block.  PACs.  Non-specific T wave abnormalities.    12/21/16: Study Conclusions  - Left ventricle: The cavity size was normal. Wall thickness was   increased in a pattern of mild LVH. Systolic function was   severely reduced. The estimated ejection fraction was in the   range of 25% to 30%. Diffuse hypokinesis. Doppler parameters are   consistent with restrictive physiology, indicative  of decreased   left ventricular diastolic compliance and/or increased left   atrial pressure. Doppler parameters are consistent with high   ventricular filling pressure. - Aortic valve: Valve mobility was restricted. There was trivial   regurgitation. - Mitral valve: Calcified annulus. Mildly thickened leaflets .   There was mild regurgitation. - Left atrium: The atrium was mildly dilated. - Right ventricle: The cavity size was mildly dilated. Systolic   function was mildly reduced. - Right atrium: The atrium was mildly dilated. - Pulmonary arteries: Systolic pressure was moderately increased.   PA peak pressure: 53 mm Hg (S). - Pericardium, extracardiac: A trivial pericardial effusion was   identified.  Carotid Doppler 12/15/15 and 11/06/16:  R and L ICA 1-39%.  L ECA >50%  Lexiscan Myoview 10/16/16:  The left ventricular ejection fraction is moderately decreased (30-44%).  Nuclear stress EF: 30%.  There is a medium defect of moderate severity present in the basal inferior, mid inferior and apical inferior location. The defect is non-reversible and likely secondary to diaphragmatic attenuation artifact and extracardiac tracer uptake as there is no focal wall  motion abnormality in this area. No ischemia noted.  This is an intermediate risk study due to LV dysfunction.  There was downsloping ST segment depression in the inferolateral leads at baseline with no change during infusion.  Recent Labs: 11/17/2016: ALT 100; TSH 4.721 12/08/2016: Brain Natriuretic Peptide 2,247.2 03/12/2017: Hemoglobin 11.0; Magnesium 1.8; Platelets 289 05/07/2017: BUN 22; Creatinine, Ser 0.89; Potassium 4.1; Sodium 136   09/07/16:  BNP 1500 Sodium 137, potassium 4.4, BUN 13, creatinine 0.78 AST 19, ALT 25 WBC 10.1, hemoglobin 13.1, hematocrit 39.7, platelets 240   Lipid Panel    Component Value Date/Time   CHOL 84 11/16/2016 0521   TRIG 91 11/16/2016 0521   HDL 18 (L) 11/16/2016 0521   CHOLHDL 4.7 11/16/2016 0521   VLDL 18 11/16/2016 0521   LDLCALC 48 11/16/2016 0521      Wt Readings from Last 3 Encounters:  07/30/17 139 lb (63 kg)  04/09/17 130 lb 12.8 oz (59.3 kg)  03/12/17 132 lb 12.8 oz (60.2 kg)      ASSESSMENT AND PLAN:  # Chronic systolic and diastolic heart failure:  # Hypertension:  LVEF 25-30%.  No ischemia on Lexiscan Myoview.  She is doing very well on Entresto.  Continue metoprolol and furosemide.  # PACs: # Atrial bigeminy: Continue metoprolol.  # Carotid stenosis: # Prior TIA:  Ms. Ault has moderate carotid stenosis.  Continue clopidogrel and atorvastatin.  LDL 48 10/2016.  # Dizziness: Improved after reducing beta blocker.    Current medicines are reviewed at length with the patient today.  The patient does not have concerns regarding medicines.  The following changes have been made:  none Labs/ tests ordered today include:   Orders Placed This Encounter  Procedures  . EKG 12-Lead     Disposition:   FU with Carmen Vallecillo C. Oval Linsey, MD, [facility] 6 months   This note was written with the assistance of speech recognition software.  Please excuse any transcriptional errors.  Signed, [place] C. Oval Linsey, MD, [facility]    07/30/2017 11:33 AM    [place]

## 2017-07-30 NOTE — Patient Instructions (Signed)

## 2017-08-04 DIAGNOSIS — R2689 Other abnormalities of gait and mobility: Secondary | ICD-10-CM | POA: Diagnosis not present

## 2017-08-04 DIAGNOSIS — J449 Chronic obstructive pulmonary disease, unspecified: Secondary | ICD-10-CM | POA: Diagnosis not present

## 2017-08-09 DIAGNOSIS — R2689 Other abnormalities of gait and mobility: Secondary | ICD-10-CM | POA: Diagnosis not present

## 2017-08-09 DIAGNOSIS — J449 Chronic obstructive pulmonary disease, unspecified: Secondary | ICD-10-CM | POA: Diagnosis not present

## 2017-08-10 DIAGNOSIS — J449 Chronic obstructive pulmonary disease, unspecified: Secondary | ICD-10-CM | POA: Diagnosis not present

## 2017-08-10 DIAGNOSIS — R2689 Other abnormalities of gait and mobility: Secondary | ICD-10-CM | POA: Diagnosis not present

## 2017-08-12 DIAGNOSIS — J449 Chronic obstructive pulmonary disease, unspecified: Secondary | ICD-10-CM | POA: Diagnosis not present

## 2017-08-12 DIAGNOSIS — R2689 Other abnormalities of gait and mobility: Secondary | ICD-10-CM | POA: Diagnosis not present

## 2017-08-16 DIAGNOSIS — R2689 Other abnormalities of gait and mobility: Secondary | ICD-10-CM | POA: Diagnosis not present

## 2017-08-16 DIAGNOSIS — J449 Chronic obstructive pulmonary disease, unspecified: Secondary | ICD-10-CM | POA: Diagnosis not present

## 2017-08-27 DIAGNOSIS — R2689 Other abnormalities of gait and mobility: Secondary | ICD-10-CM | POA: Diagnosis not present

## 2017-08-27 DIAGNOSIS — J449 Chronic obstructive pulmonary disease, unspecified: Secondary | ICD-10-CM | POA: Diagnosis not present

## 2017-09-02 DIAGNOSIS — J449 Chronic obstructive pulmonary disease, unspecified: Secondary | ICD-10-CM | POA: Diagnosis not present

## 2017-09-02 DIAGNOSIS — R2689 Other abnormalities of gait and mobility: Secondary | ICD-10-CM | POA: Diagnosis not present

## 2017-09-06 DIAGNOSIS — R2689 Other abnormalities of gait and mobility: Secondary | ICD-10-CM | POA: Diagnosis not present

## 2017-09-06 DIAGNOSIS — J449 Chronic obstructive pulmonary disease, unspecified: Secondary | ICD-10-CM | POA: Diagnosis not present

## 2017-09-07 DIAGNOSIS — J449 Chronic obstructive pulmonary disease, unspecified: Secondary | ICD-10-CM | POA: Diagnosis not present

## 2017-09-07 DIAGNOSIS — R2689 Other abnormalities of gait and mobility: Secondary | ICD-10-CM | POA: Diagnosis not present

## 2017-09-10 DIAGNOSIS — J449 Chronic obstructive pulmonary disease, unspecified: Secondary | ICD-10-CM | POA: Diagnosis not present

## 2017-09-10 DIAGNOSIS — R2689 Other abnormalities of gait and mobility: Secondary | ICD-10-CM | POA: Diagnosis not present

## 2017-09-13 DIAGNOSIS — J449 Chronic obstructive pulmonary disease, unspecified: Secondary | ICD-10-CM | POA: Diagnosis not present

## 2017-09-13 DIAGNOSIS — R2689 Other abnormalities of gait and mobility: Secondary | ICD-10-CM | POA: Diagnosis not present

## 2017-09-17 DIAGNOSIS — R5381 Other malaise: Secondary | ICD-10-CM | POA: Diagnosis not present

## 2017-09-17 DIAGNOSIS — I509 Heart failure, unspecified: Secondary | ICD-10-CM | POA: Diagnosis not present

## 2017-09-17 DIAGNOSIS — I498 Other specified cardiac arrhythmias: Secondary | ICD-10-CM | POA: Diagnosis not present

## 2017-09-17 DIAGNOSIS — R42 Dizziness and giddiness: Secondary | ICD-10-CM | POA: Diagnosis not present

## 2017-09-17 DIAGNOSIS — I890 Lymphedema, not elsewhere classified: Secondary | ICD-10-CM | POA: Diagnosis not present

## 2017-09-17 DIAGNOSIS — E119 Type 2 diabetes mellitus without complications: Secondary | ICD-10-CM | POA: Diagnosis not present

## 2017-09-17 DIAGNOSIS — E875 Hyperkalemia: Secondary | ICD-10-CM | POA: Diagnosis not present

## 2017-10-14 ENCOUNTER — Other Ambulatory Visit: Payer: Self-pay | Admitting: Cardiovascular Disease

## 2017-10-14 NOTE — Telephone Encounter (Signed)
REFILL 

## 2017-10-14 NOTE — Telephone Encounter (Signed)
Please review for refill, Thanks !  

## 2017-12-28 ENCOUNTER — Telehealth: Payer: Self-pay | Admitting: Cardiovascular Disease

## 2017-12-28 NOTE — Telephone Encounter (Signed)
Left message to call back  

## 2017-12-28 NOTE — Telephone Encounter (Signed)
New Message:     Pt is calling due to having to go and get a cap put on a tooth that fell out last night. Pt wants to make sure it is okay to go to this appt since she has already taken her medication.

## 2017-12-28 NOTE — Telephone Encounter (Signed)
Returned call to pt. Unable to leave message due to phone just ringing.  

## 2017-12-28 NOTE — Telephone Encounter (Signed)
New Message ° ° ° °Pt is returning call  °

## 2017-12-29 ENCOUNTER — Telehealth: Payer: Self-pay | Admitting: Cardiovascular Disease

## 2017-12-29 NOTE — Telephone Encounter (Signed)
New message    1. What dental office are you calling from? Dr Viona Gilmore. [facility]  2. What is your office phone and fax number? AttnJanett Billow [phone] fax, phone [phone]  3. What type of procedure is the patient having performed? Surgical extraction  4. What date is procedure scheduled or is the patient there now? TBD  5. What is your question (ex. Antibiotics prior to procedure, holding medication-we need to know how long dentist wants pt to hold med)? Is pre med needed, should plavix be held

## 2017-12-30 NOTE — Telephone Encounter (Signed)
Refer to telephone note requesting surgical clearance.

## 2017-12-30 NOTE — Telephone Encounter (Signed)
Follow Up:   Pt calling to find out the status of her clearance. Please call pt today if possible.

## 2017-12-31 NOTE — Telephone Encounter (Signed)
Pt states there will be no anesthesia and it is one tooth being extracted. I will fax clearance instructions to dentist office

## 2017-12-31 NOTE — Telephone Encounter (Signed)
   Primary [place], MD  Chart reviewed as part of pre-operative protocol coverage.   Callback staff, please call dentist office - need more details on procedure. How many teeth are being extracted and what kind of anesthesia is being used?  If it is a simple office based extraction, based on ACC/AHA guidelines, Helen [name] would be at acceptable risk for the planned procedure without further cardiovascular testing and these are typically able to be performed on Plavix without interruption. No other pre-med is necessary based on cardiac history. If that's the case, please let the patient know as well.  Charlie Pitter, PA-C 12/31/2017, 4:09 PM

## 2018-01-03 DIAGNOSIS — R278 Other lack of coordination: Secondary | ICD-10-CM | POA: Diagnosis not present

## 2018-01-03 DIAGNOSIS — M6281 Muscle weakness (generalized): Secondary | ICD-10-CM | POA: Diagnosis not present

## 2018-01-03 DIAGNOSIS — I890 Lymphedema, not elsewhere classified: Secondary | ICD-10-CM | POA: Diagnosis not present

## 2018-01-06 ENCOUNTER — Telehealth: Payer: Self-pay | Admitting: Cardiovascular Disease

## 2018-01-06 NOTE — Telephone Encounter (Signed)
New Message   Helen [name] calling to check on dentist clearance for blood thinners, per note in epic a fax was suppose to be sent to Dr. Wyline Beady office but they never received it. Please call and fax  [phone]

## 2018-01-06 NOTE — Telephone Encounter (Signed)
DR OFFICE HAS CLOSED FOR REST OF WEEK. NOT OPEN ON FRIDAYS ,CALL BACK MONDAY AT 8 AM

## 2018-01-10 NOTE — Telephone Encounter (Signed)
I called dental office who states they still do not have fax. I will manually fax clearance over to [phone]

## 2018-01-10 NOTE — Telephone Encounter (Signed)
I called dental office who states they still do not have fax. I will manually fax clearance over to [phone]      Documentation

## 2018-01-11 ENCOUNTER — Telehealth: Payer: Self-pay | Admitting: Cardiovascular Disease

## 2018-01-11 NOTE — Telephone Encounter (Signed)
   Primary [place], MD  Chart reviewed as part of pre-operative protocol coverage.   Since it is a simple office based extraction, based on ACC/AHA guidelines, Chaniya Genter would be at acceptable risk for the planned procedure without further cardiovascular testing and these are typically able to be performed on Plavix without interruption. No other pre-med is necessary based on cardiac history.    Daune Perch, AGNP-C [facility] 01/11/2018  1:56 PM

## 2018-01-11 NOTE — Telephone Encounter (Signed)
Close error

## 2018-01-11 NOTE — Telephone Encounter (Signed)
efaxed again on 01/11/18. Will also manually fax.

## 2018-01-11 NOTE — Telephone Encounter (Signed)
Follow up    Dental office calling to clarify instructions for Plavix and pre antibiotic

## 2018-01-21 DIAGNOSIS — N3000 Acute cystitis without hematuria: Secondary | ICD-10-CM | POA: Diagnosis not present

## 2018-01-21 DIAGNOSIS — R350 Frequency of micturition: Secondary | ICD-10-CM | POA: Diagnosis not present

## 2018-01-25 DIAGNOSIS — E875 Hyperkalemia: Secondary | ICD-10-CM | POA: Diagnosis not present

## 2018-01-25 DIAGNOSIS — I509 Heart failure, unspecified: Secondary | ICD-10-CM | POA: Diagnosis not present

## 2018-01-25 DIAGNOSIS — I498 Other specified cardiac arrhythmias: Secondary | ICD-10-CM | POA: Diagnosis not present

## 2018-01-25 DIAGNOSIS — R5381 Other malaise: Secondary | ICD-10-CM | POA: Diagnosis not present

## 2018-01-25 DIAGNOSIS — R42 Dizziness and giddiness: Secondary | ICD-10-CM | POA: Diagnosis not present

## 2018-01-25 DIAGNOSIS — I890 Lymphedema, not elsewhere classified: Secondary | ICD-10-CM | POA: Diagnosis not present

## 2018-01-25 DIAGNOSIS — E119 Type 2 diabetes mellitus without complications: Secondary | ICD-10-CM | POA: Diagnosis not present

## 2018-02-16 DIAGNOSIS — H5213 Myopia, bilateral: Secondary | ICD-10-CM | POA: Diagnosis not present

## 2018-02-16 DIAGNOSIS — G43809 Other migraine, not intractable, without status migrainosus: Secondary | ICD-10-CM | POA: Diagnosis not present

## 2018-02-16 DIAGNOSIS — H52223 Regular astigmatism, bilateral: Secondary | ICD-10-CM | POA: Diagnosis not present

## 2018-02-16 DIAGNOSIS — H524 Presbyopia: Secondary | ICD-10-CM | POA: Diagnosis not present

## 2018-02-16 DIAGNOSIS — H35373 Puckering of macula, bilateral: Secondary | ICD-10-CM | POA: Diagnosis not present

## 2018-02-16 DIAGNOSIS — H04123 Dry eye syndrome of bilateral lacrimal glands: Secondary | ICD-10-CM | POA: Diagnosis not present

## 2018-02-19 ENCOUNTER — Other Ambulatory Visit: Payer: Self-pay | Admitting: Student

## 2018-02-21 NOTE — Telephone Encounter (Signed)
New message   Also send 90 day supply to [facility]     Which medications need to be refilled? (please list name of each medication and dose if known) sacubitril-valsartan (ENTRESTO) 24-26 MG  2. Which pharmacy/location (including street and city if local pharmacy) is medication to be sent to? [facility],  [place]  3. Do they need a 30 day or 90 day supply? [place]

## 2018-02-22 ENCOUNTER — Other Ambulatory Visit: Payer: Self-pay | Admitting: Cardiovascular Disease

## 2018-02-22 MED ORDER — SACUBITRIL-VALSARTAN 24-26 MG PO TABS: 1.0000 | ORAL_TABLET | Freq: Two times a day (BID) | ORAL | 3 refills | Status: DC

## 2018-02-22 NOTE — Telephone Encounter (Signed)
NEW MESSAGE   Request made on 02/21/18 to send refill to both [facility] and [facility]. Patient went to [facility] medication was not there. She is out of medication   *STAT* If patient is at the pharmacy, call can be transferred to refill team.   1. Which medications need to be refilled? (please list name of each medication and dose if known) ENTRESTO 24-26 MG  2. Which pharmacy/location (including street and city if local pharmacy) is medication to be sent to? [facility], [place]  3. Do they need a 30 day or 90 day supply? [place]

## 2018-02-22 NOTE — Telephone Encounter (Signed)
Spoke to daughter- e-sent medications

## 2018-02-22 NOTE — Telephone Encounter (Signed)
°*  STAT* If patient is at the pharmacy, call can be transferred to refill team.   1. Which medications need to be refilled? (please list name of each medication and dose if known) needs her Entresto sent to her local pharmacy until her mail order comes in-please call asap please-this was supposed to been called yesterdayl  2. Which pharmacy/location (including street and city if local pharmacy) is medication to be sent to?[facility] [place]  3. Do they need a 30 day or 90 day supply? [place]

## 2018-02-23 ENCOUNTER — Telehealth: Payer: Self-pay | Admitting: Cardiovascular Disease

## 2018-02-23 MED ORDER — SACUBITRIL-VALSARTAN 24-26 MG PO TABS: 1.0000 | ORAL_TABLET | Freq: Two times a day (BID) | ORAL | 0 refills | Status: DC

## 2018-02-23 NOTE — Telephone Encounter (Signed)
Pt calling and stated she has missed her Entresto for today and was wondering if she miss it another day would that be alright. Please advise pt.

## 2018-02-23 NOTE — Telephone Encounter (Signed)
Spoke to patient who states she was unaware that she had to notify the pharmacy to get her prescription refilled so she ran out of [place] and will not get her mail order until next week.   She is completely out and has not taken any today.   She states a rx was sent to local pharmacy yesterday but they did not receive this.    She is unable to drive so she cannot come to office to get samples.     Called her local pharmacy and spoke to [place] received the prescription were able to get insurance approval to give her a 10 day supply.     Patient aware and verbalized understanding.  She will have mail order rx by the time the 10 day supply runs out.

## 2018-03-01 ENCOUNTER — Telehealth: Payer: Self-pay | Admitting: Cardiovascular Disease

## 2018-03-01 NOTE — Telephone Encounter (Signed)
New Message:       STAT if HR is under 50 or over 120 (normal HR is 60-100 beats per minute)  1) What is your heart rate? 110  2) Do you have a log of your heart rate readings (document readings)? 110/113/69 when standing  3) Do you have any other symptoms? Pt states her pulse rate is going up and down. Pt states she does have and appt on 03/09/18 but pt states is there anything she needs to do before this appt and does she need to come in sooner.

## 2018-03-01 NOTE — Telephone Encounter (Signed)
Returned call to patient who reports episodes of tachycardia. She reports her HR has gotten up to 110bpm. This AM it was 69 and increased with activity (normal). She states her BP is OK but goes up and down - last night was 110/50. she states she PCP wanted her to see if Dr. Oval Linsey needed patient to be seen sooner. Advised that she monitor BP and HR and bring log to her appointment next week. Informed her that MD is out of the office and she likely would not recommend a med change without seeing her and/or seeing a log of BP/HR trends. Informed patient I would notify MD for any additional recommendations.

## 2018-03-04 NOTE — Telephone Encounter (Signed)
Agree.  Bring log to follow up.

## 2018-03-09 ENCOUNTER — Encounter: Payer: Self-pay | Admitting: Cardiovascular Disease

## 2018-03-09 ENCOUNTER — Encounter: Payer: Self-pay | Admitting: *Deleted

## 2018-03-09 ENCOUNTER — Ambulatory Visit (INDEPENDENT_AMBULATORY_CARE_PROVIDER_SITE_OTHER): Payer: Medicare Other | Admitting: Cardiovascular Disease

## 2018-03-09 VITALS — BP 112/62 | HR 85 | Ht 63.0 in | Wt 153.0 lb

## 2018-03-09 DIAGNOSIS — I491 Atrial premature depolarization: Secondary | ICD-10-CM | POA: Diagnosis not present

## 2018-03-09 DIAGNOSIS — I10 Essential (primary) hypertension: Secondary | ICD-10-CM

## 2018-03-09 DIAGNOSIS — R002 Palpitations: Secondary | ICD-10-CM | POA: Diagnosis not present

## 2018-03-09 DIAGNOSIS — I5042 Chronic combined systolic (congestive) and diastolic (congestive) heart failure: Secondary | ICD-10-CM | POA: Diagnosis not present

## 2018-03-09 DIAGNOSIS — R000 Tachycardia, unspecified: Secondary | ICD-10-CM

## 2018-03-09 MED ORDER — SACUBITRIL-VALSARTAN 24-26 MG PO TABS: 1.0000 | ORAL_TABLET | Freq: Two times a day (BID) | ORAL | 3 refills | Status: DC

## 2018-03-09 MED ORDER — MECLIZINE HCL 25 MG PO TABS: ORAL_TABLET | ORAL | 2 refills | Status: DC

## 2018-03-09 NOTE — Progress Notes (Signed)
Cardiology Office Note   Date:  03/09/2018   ID:  Helen [name], DOB 08-26-1929, MRN [id number]  PCP:  Vernie Shanks, MD  Cardiologist:   Skeet Latch, MD   No chief complaint on file.    History of Present Illness: Helen [name] is a 84 y.o. female with chronic systolic and diastolic heart failure, hypertension, hyperlipidemia, diabetes, prior stroke, CAD, carotid stenosis and breast cancer s/p chemotherapy and double mastectomy who presents for follow up on heart failure.  Helen [name] was admitted 02/2016 with hyponatremia.  That hospitalization she had an echo that revealed LVEF LVEF 40-45% with global hypokinesis worse in the basal-mid anteroseptal and inferoseptal regions, moderately elevated pulmonary pressures, and grade 2 diastolic dysfunction.  She was not evaluated by cardiology at the time.  Since then she has remained on supplemental oxygen.  BNP was noted to be 1500, So she was referred to cardiology for evaluation 10/08/16. EKG was notable for diffuse T wave inversions. She was referred for [facility] 10/16/16 that revealed LVEF 30% with a fixed defect in the inferior wall felt to be diaphragmatic attenuation.  She was admitted 10/2016 with heart failure and acute on chronic respiratory failure. BNP was 1300. Cardiology was not consulted. She followed up with Helen Ransom, Helen [name] on 12/08/16 and was referred for repeat echocardiogram. Echo revealed LVEF 25-30% with diffuse hypokinesis and elevated left ventricular filling pressures. She also had moderate pulmonary hypertension.  She was started on Entresto and had improvement in edema.    Helen [name] reports intermittent palpitations over the last few weeks.  She wonders if it may be due to stress during the anxiety.  She woke up with her heart racing.  There is no associated shortness of breath or chest pain.  She also notices that her heart rate has been running higher than usual.  It is been mostly in the 90s to low 100s.   She also reports dry mouth and tongue which she attributes to her medications.  Since taking meclizine her dizziness has improved.  She has an upcoming dental procedure and requests cardiac clearance.  She recently ran out of [place] for 3 days.  She has resumed the medication without complication.  She denies any lower extremity edema, orthopnea, or PND.   Past Medical History:  Diagnosis Date  . Anemia   . Cancer [facility])    Breast cancer  . Chronic combined systolic (congestive) and diastolic (congestive) heart failure (HCC)    a. EF 25-30% by echo in 11/2016. Recent NST showing no inducible ischemia --> therefore thought to be most consistent with nonischemic cardiomyopathy.   . Diabetes mellitus   . History of stroke 11/2016   incidental finding on MRI  . Hypercholesteremia   . Hypertension   . Peripheral vascular disease ([place]) 2015   bilateral moderate CA disease-Dr Bridgett Larsson    Past Surgical History:  Procedure Laterality Date  . ABDOMINAL HYSTERECTOMY    . APPENDECTOMY  1951  . [place]  . CHOLECYSTECTOMY  2003   By Dr. Excell Seltzer  . EYE SURGERY Bilateral 2009   Cataract surgery  . FRACTURE SURGERY Right    ORIF   by Dr. Burney Gauze  . HUMERUS FRACTURE SURGERY    . MASTECTOMY Bilateral   . NASAL SEPTUM SURGERY  1977   Dr. Ernesto Rutherford  . TOENAIL EXCISION       Current Outpatient Medications  Medication Sig Dispense Refill  . atorvastatin (LIPITOR) 80  MG tablet Take 80 mg by mouth at bedtime.    . Biotin 100 MG/GM POWD Take 100 mg by mouth daily.    . cholecalciferol (VITAMIN D) 1000 units tablet Take 1,000 Units by mouth every evening.     . clopidogrel (PLAVIX) 75 MG tablet Take 75 mg by mouth daily.    . fluticasone (FLONASE) 50 MCG/ACT nasal spray Place 1 spray into both nostrils daily as needed for allergies or rhinitis.     . furosemide (LASIX) 40 MG tablet Take 1 tablet (40 mg total) by mouth daily. 90 tablet 3  . meclizine (ANTIVERT) 25 MG tablet TAKE  ONE TABLET BY MOUTH THREE TIMES A DAY AS NEEDED FOR DIZZINESS 90 tablet 0  . metoprolol succinate (TOPROL-XL) 25 MG 24 hr tablet Take 1 tablet (25 mg total) by mouth daily. 90 tablet 3  . Multiple Vitamin (MULTIVITAMIN WITH MINERALS) TABS tablet Take 1 tablet by mouth daily.     [name] omeprazole (PRILOSEC) 20 MG capsule Take 20 mg by mouth daily.    . sacubitril-valsartan (ENTRESTO) 24-26 MG Take 1 tablet by mouth 2 (two) times daily. 14 tablet 0  . vitamin C (ASCORBIC ACID) 500 MG tablet Take 500 mg by mouth daily.      No current facility-administered medications for this visit.     Allergies:   Morphine and related; Other; Sulfa antibiotics; Sulfasalazine; and Nickel    Social History:  The patient  reports that she quit smoking about 39 years ago. She has never used smokeless tobacco. She reports that she does not drink alcohol or use drugs.   Family History:  The patient's family history includes Cancer in her mother; Diabetes in her brother; Heart attack in her father; Heart disease in her father; Hyperlipidemia in her brother; Kidney disease in her brother; Pulmonary embolism in her mother; Stroke in her brother and father.    ROS:  Please see the history of present illness.   Otherwise, review of systems are positive for none.   All other systems are reviewed and negative.    PHYSICAL EXAM: VS:  BP 112/62   Pulse 85   Ht 5\' 3"  (1.6 m)   Wt 153 lb (69.4 kg)   SpO2 95%   BMI 27.10 kg/m  , BMI Body mass index is 27.1 kg/m. GENERAL:  Well appearing HEENT: Pupils equal round and reactive, fundi not visualized, oral mucosa unremarkable NECK:  No jugular venous distention, waveform within normal limits, carotid upstroke brisk and symmetric, no bruits LUNGS:  Clear to auscultation bilaterally HEART:  RRR.  PMI not displaced or sustained,S1 and S2 within normal limits, no S3, no S4, no clicks, no rubs, no murmurs ABD:  Flat, positive bowel sounds normal in frequency in pitch, no bruits,  no rebound, no guarding, no midline pulsatile mass, no hepatomegaly, no splenomegaly EXT:  2 plus pulses throughout, no edema, no cyanosis no clubbing SKIN:  No rashes no nodules NEURO:  Cranial nerves II through XII grossly intact, motor grossly intact throughout PSYCH:  Cognitively intact, oriented to person place and time    EKG:  EKG is ordered today. The ekg ordered 10/08/16 demonstrates sinus rhythm rate 76 bpm.  PVCs.  Inferolateral TWI 07/30/17: Sinus rhythm.  Rate 83 bpm.  First degree AV block.  PACs.  Non-specific T wave abnormalities.   03/09/2018: Sinus rhythm.  Rate 85 bpm.  First-degree AV block.  Low voltage precordial leads.  12/21/16: Study Conclusions  - Left ventricle: The  cavity size was normal. Wall thickness was   increased in a pattern of mild LVH. Systolic function was   severely reduced. The estimated ejection fraction was in the   range of 25% to 30%. Diffuse hypokinesis. Doppler parameters are   consistent with restrictive physiology, indicative of decreased   left ventricular diastolic compliance and/or increased left   atrial pressure. Doppler parameters are consistent with high   ventricular filling pressure. - Aortic valve: Valve mobility was restricted. There was trivial   regurgitation. - Mitral valve: Calcified annulus. Mildly thickened leaflets .   There was mild regurgitation. - Left atrium: The atrium was mildly dilated. - Right ventricle: The cavity size was mildly dilated. Systolic   function was mildly reduced. - Right atrium: The atrium was mildly dilated. - Pulmonary arteries: Systolic pressure was moderately increased.   PA peak pressure: 53 mm Hg (S). - Pericardium, extracardiac: A trivial pericardial effusion was   identified.  Carotid Doppler 12/15/15 and 11/06/16:  R and L ICA 1-39%.  L ECA >50%  Lexiscan Myoview 10/16/16:  The left ventricular ejection fraction is moderately decreased (30-44%).  Nuclear stress EF: 30%.  There  is a medium defect of moderate severity present in the basal inferior, mid inferior and apical inferior location. The defect is non-reversible and likely secondary to diaphragmatic attenuation artifact and extracardiac tracer uptake as there is no focal wall motion abnormality in this area. No ischemia noted.  This is an intermediate risk study due to LV dysfunction.  There was downsloping ST segment depression in the inferolateral leads at baseline with no change during infusion.  Recent Labs: 03/12/2017: Hemoglobin 11.0; Magnesium 1.8; Platelets 289 05/07/2017: BUN 22; Creatinine, Ser 0.89; Potassium 4.1; Sodium 136   09/07/16:  BNP 1500 Sodium 137, potassium 4.4, BUN 13, creatinine 0.78 AST 19, ALT 25 WBC 10.1, hemoglobin 13.1, hematocrit 39.7, platelets 240   Lipid Panel    Component Value Date/Time   CHOL 84 11/16/2016 0521   TRIG 91 11/16/2016 0521   HDL 18 (L) 11/16/2016 0521   CHOLHDL 4.7 11/16/2016 0521   VLDL 18 11/16/2016 0521   LDLCALC 48 11/16/2016 0521      Wt Readings from Last 3 Encounters:  03/09/18 153 lb (69.4 kg)  07/30/17 139 lb (63 kg)  04/09/17 130 lb 12.8 oz (59.3 kg)      ASSESSMENT AND PLAN:  # Chronic systolic and diastolic heart failure:  # Hypertension:  LVEF 25-30%.  No ischemia on Lexiscan Myoview.  She is doing very well on Entresto.  Continue metoprolol and furosemide.   # PACs: # Atrial bigeminy: Continue metoprolol.  OK to take extra as needed.  Check 7 day event monitor for palpitations and tachycardia.  Check CBC, magnesium, TSH, free T4.   # Carotid stenosis: # Prior TIA:  Helen [name] has moderate carotid stenosis.  Continue clopidogrel and atorvastatin.  Check lipids.   # Dizziness: Improved after reducing beta blocker and on meclizine.   Current medicines are reviewed at length with the patient today.  The patient does not have concerns regarding medicines.  The following changes have been made:  None  Labs/ tests ordered  today include:   Orders Placed This Encounter  Procedures  . CBC with Differential/Platelet  . T4, free  . TSH  . Basic metabolic panel  . Magnesium  . T3, free  . CARDIAC EVENT MONITOR  . EKG 12-Lead     Disposition:   FU with Helen Cabal C. Oval Linsey,  MD, [facility] in 3 months     Signed, Helen Giambalvo C. Oval Linsey, MD, [facility]  03/09/2018 12:02 PM    [place]

## 2018-03-09 NOTE — Patient Instructions (Signed)
Medication Instructions:  Your physician recommends that you continue on your current medications as directed. Please refer to the Current Medication list given to you today.  Labwork: TSH/FT4/FT3/MAGNESIUM/BMET/CBC TODAY   Testing/Procedures: Your physician has recommended that you wear an event monitor. Event monitors are medical devices that record the heart's electrical activity. Doctors most often [place] these monitors to diagnose arrhythmias. Arrhythmias are problems with the speed or rhythm of the heartbeat. The monitor is a small, portable device. You can wear one while you do your normal daily activities. This is usually used to diagnose what is causing palpitations/syncope (passing out). 7 DAYS   Follow-Up: Your physician recommends that you schedule a follow-up appointment in: 3 MONTH   If you need a refill on your cardiac medications before your next appointment, please call your pharmacy.

## 2018-03-17 ENCOUNTER — Ambulatory Visit (INDEPENDENT_AMBULATORY_CARE_PROVIDER_SITE_OTHER): Payer: Medicare Other

## 2018-03-17 ENCOUNTER — Other Ambulatory Visit: Payer: Medicare Other | Admitting: *Deleted

## 2018-03-17 DIAGNOSIS — R000 Tachycardia, unspecified: Secondary | ICD-10-CM

## 2018-03-17 DIAGNOSIS — I491 Atrial premature depolarization: Secondary | ICD-10-CM

## 2018-03-17 DIAGNOSIS — R002 Palpitations: Secondary | ICD-10-CM

## 2018-03-18 ENCOUNTER — Telehealth: Payer: Self-pay | Admitting: Cardiovascular Disease

## 2018-03-18 LAB — CBC WITH DIFFERENTIAL/PLATELET
Basophils Absolute: 0 10*3/uL (ref 0.0–0.2)
Basos: 0 %
EOS (ABSOLUTE): 0.3 10*3/uL (ref 0.0–0.4)
Eos: 3 %
Hematocrit: 35.6 % (ref 34.0–46.6)
Hemoglobin: 11.4 g/dL (ref 11.1–15.9)
Immature Grans (Abs): 0 10*3/uL (ref 0.0–0.1)
Immature Granulocytes: 0 %
Lymphocytes Absolute: 1.7 10*3/uL (ref 0.7–3.1)
Lymphs: 18 %
MCH: 28.5 pg (ref 26.6–33.0)
MCHC: 32 g/dL (ref 31.5–35.7)
MCV: 89 fL (ref 79–97)
Monocytes Absolute: 0.7 10*3/uL (ref 0.1–0.9)
Monocytes: 7 %
Neutrophils Absolute: 6.7 10*3/uL (ref 1.4–7.0)
Neutrophils: 72 %
Platelets: 249 10*3/uL (ref 150–450)
RBC: 4 x10E6/uL (ref 3.77–5.28)
RDW: 14.9 % (ref 12.3–15.4)
WBC: 9.4 10*3/uL (ref 3.4–10.8)

## 2018-03-18 LAB — TSH: TSH: 3.95 u[IU]/mL (ref 0.450–4.500)

## 2018-03-18 LAB — BASIC METABOLIC PANEL
BUN/Creatinine Ratio: 24 (ref 12–28)
BUN: 21 mg/dL (ref 8–27)
CO2: 26 mmol/L (ref 20–29)
Calcium: 8.9 mg/dL (ref 8.7–10.3)
Chloride: 97 mmol/L (ref 96–106)
Creatinine, Ser: 0.89 mg/dL (ref 0.57–1.00)
GFR calc Af Amer: 68 mL/min/{1.73_m2} (ref >59)
GFR calc non Af Amer: 59 mL/min/{1.73_m2} — ABNORMAL LOW (ref >59)
Glucose: 151 mg/dL — ABNORMAL HIGH (ref 65–99)
Potassium: 4.1 mmol/L (ref 3.5–5.2)
Sodium: 140 mmol/L (ref 134–144)

## 2018-03-18 LAB — T4, FREE: Free T4: 1.14 ng/dL (ref 0.82–1.77)

## 2018-03-18 LAB — MAGNESIUM: Magnesium: 1.2 mg/dL — ABNORMAL LOW (ref 1.6–2.3)

## 2018-03-18 LAB — T3, FREE: T3, Free: 2.9 pg/mL (ref 2.0–4.4)

## 2018-03-18 NOTE — Telephone Encounter (Signed)
Monitor report received from [employer] reads atrial flutter with with variable conduction and HR 70. Patient reported the symptoms as rapid or fast heartbeat while resting and this occurred 7:02am.   Monitor report was reviewed by DOD Dr. Harrell Gave - ?Aflutter. No changes made, patient should continue to wear monitor for duration (7 days)

## 2018-03-18 NOTE — Telephone Encounter (Signed)
New Message    DJ with [employer] is calling with a critical EKG

## 2018-03-18 NOTE — Telephone Encounter (Signed)
DJ called from [employer] he states that monitor was auto-triggered with a first note of atrial flutter HR 80-90. He will fax immedicately

## 2018-03-18 NOTE — Telephone Encounter (Signed)
Attempted a second time, still busy called originally at 806 am.will try again later

## 2018-03-21 MED ORDER — APIXABAN 5 MG PO TABS: 5.0000 mg | ORAL_TABLET | Freq: Two times a day (BID) | ORAL | 5 refills | Status: DC

## 2018-03-21 NOTE — Telephone Encounter (Signed)
Let's get her in afib clinic since I'm rounding this week. I recommend she stop clopidogrel and start aspirin 81mg  + Eliquis 5mg  bid.

## 2018-03-21 NOTE — Telephone Encounter (Signed)
Patient has appointment Wednesday 03/23/18 at 3:30. Advised patient of results,  medication changes, and appointment. Patient repeated and verified.

## 2018-03-23 ENCOUNTER — Encounter (HOSPITAL_COMMUNITY): Payer: Self-pay | Admitting: Nurse Practitioner

## 2018-03-23 ENCOUNTER — Ambulatory Visit (HOSPITAL_COMMUNITY)
Admission: RE | Admit: 2018-03-23 | Discharge: 2018-03-23 | Disposition: A | Discharge status: Home / Self Care | Payer: Medicare Other | Source: Ambulatory Visit | Attending: Nurse Practitioner | Admitting: Nurse Practitioner

## 2018-03-23 VITALS — BP 152/76 | HR 104 | Ht 63.0 in | Wt 155.0 lb

## 2018-03-23 DIAGNOSIS — Z7901 Long term (current) use of anticoagulants: Secondary | ICD-10-CM | POA: Insufficient documentation

## 2018-03-23 DIAGNOSIS — Z8249 Family history of ischemic heart disease and other diseases of the circulatory system: Secondary | ICD-10-CM | POA: Diagnosis not present

## 2018-03-23 DIAGNOSIS — Z9049 Acquired absence of other specified parts of digestive tract: Secondary | ICD-10-CM | POA: Insufficient documentation

## 2018-03-23 DIAGNOSIS — I5042 Chronic combined systolic (congestive) and diastolic (congestive) heart failure: Secondary | ICD-10-CM | POA: Insufficient documentation

## 2018-03-23 DIAGNOSIS — Z823 Family history of stroke: Secondary | ICD-10-CM | POA: Diagnosis not present

## 2018-03-23 DIAGNOSIS — Z9013 Acquired absence of bilateral breasts and nipples: Secondary | ICD-10-CM | POA: Insufficient documentation

## 2018-03-23 DIAGNOSIS — I4892 Unspecified atrial flutter: Secondary | ICD-10-CM | POA: Insufficient documentation

## 2018-03-23 DIAGNOSIS — Z853 Personal history of malignant neoplasm of breast: Secondary | ICD-10-CM | POA: Insufficient documentation

## 2018-03-23 DIAGNOSIS — I110 Hypertensive heart disease with heart failure: Secondary | ICD-10-CM | POA: Diagnosis not present

## 2018-03-23 DIAGNOSIS — E7800 Pure hypercholesterolemia, unspecified: Secondary | ICD-10-CM | POA: Diagnosis not present

## 2018-03-23 DIAGNOSIS — Z90710 Acquired absence of both cervix and uterus: Secondary | ICD-10-CM | POA: Diagnosis not present

## 2018-03-23 DIAGNOSIS — Z885 Allergy status to narcotic agent status: Secondary | ICD-10-CM | POA: Diagnosis not present

## 2018-03-23 DIAGNOSIS — Z7982 Long term (current) use of aspirin: Secondary | ICD-10-CM | POA: Insufficient documentation

## 2018-03-23 DIAGNOSIS — Z9221 Personal history of antineoplastic chemotherapy: Secondary | ICD-10-CM | POA: Diagnosis not present

## 2018-03-23 DIAGNOSIS — Z841 Family history of disorders of kidney and ureter: Secondary | ICD-10-CM | POA: Insufficient documentation

## 2018-03-23 DIAGNOSIS — I484 Atypical atrial flutter: Secondary | ICD-10-CM

## 2018-03-23 DIAGNOSIS — Z8673 Personal history of transient ischemic attack (TIA), and cerebral infarction without residual deficits: Secondary | ICD-10-CM | POA: Diagnosis not present

## 2018-03-23 DIAGNOSIS — E1151 Type 2 diabetes mellitus with diabetic peripheral angiopathy without gangrene: Secondary | ICD-10-CM | POA: Diagnosis not present

## 2018-03-23 DIAGNOSIS — Z87891 Personal history of nicotine dependence: Secondary | ICD-10-CM | POA: Diagnosis not present

## 2018-03-23 DIAGNOSIS — Z79899 Other long term (current) drug therapy: Secondary | ICD-10-CM | POA: Insufficient documentation

## 2018-03-23 DIAGNOSIS — Z833 Family history of diabetes mellitus: Secondary | ICD-10-CM | POA: Insufficient documentation

## 2018-03-23 DIAGNOSIS — I2510 Atherosclerotic heart disease of native coronary artery without angina pectoris: Secondary | ICD-10-CM | POA: Insufficient documentation

## 2018-03-23 DIAGNOSIS — Z882 Allergy status to sulfonamides status: Secondary | ICD-10-CM | POA: Insufficient documentation

## 2018-03-23 MED ORDER — METOPROLOL SUCCINATE ER 25 MG PO TB24: ORAL_TABLET | ORAL | 3 refills | Status: DC

## 2018-03-23 NOTE — Patient Instructions (Signed)
Increase Metoprolol Succinate to 1 tablet in the morning and 1/2 tablet in the evening

## 2018-03-24 ENCOUNTER — Telehealth: Payer: Self-pay | Admitting: *Deleted

## 2018-03-24 MED ORDER — MAGNESIUM OXIDE 400 MG PO TABS: ORAL_TABLET | ORAL | 3 refills | Status: AC

## 2018-03-24 MED ORDER — MAGNESIUM OXIDE -MG SUPPLEMENT 400 (240 MG) MG PO TABS: ORAL_TABLET | ORAL | 3 refills | Status: DC

## 2018-03-24 NOTE — Addendum Note (Signed)
Addended by: Alvina Filbert B on: 03/24/2018 06:50 PM   Modules accepted: Orders

## 2018-03-24 NOTE — Telephone Encounter (Addendum)
Discussed length of BID with Rhonda B PA and for 1 week and then daily. Advised patient, verbalized understanding.   ----- Message from Skeet Latch, MD sent at 03/23/2018  6:27 PM EDT ----- Normal kidney function, blood counts and thyroid function.  Magnesium is very low.  Start magnesium oxide 400mg  bid then 400mg  daily.

## 2018-03-24 NOTE — Progress Notes (Signed)
Primary Care Physician: Vernie Shanks, MD Referring Physician: Dr. Jeralyn Bennett Helen [name] is a 84 y.o. female with a h/o with chronic systolic and diastolic heart failure, hypertension, hyperlipidemia, diabetes, prior stroke, CAD, carotid stenosis and breast cancer s/p chemotherapy and double mastectomy in the afib clinic for evaluation.  She saw Dr. Oval Linsey, 7/17 and mentioned she was having palpitations. Monitor was placed and showed atrial flutter. She has noted increase of HR up to low 100's intermittently during the day. EKG shows either atrial tach at 104 beats per minute or atypical atrial tach at 104 bpm.  Dr. Oval Linsey started eliquis and asa and stopped Plavix that pt was on for h/o TIA. She is already on metoprolol 25 mg daily which dose was reduced in the recant past for dizziness. She does not drink alcohol, mod caffeine intake, denies snoring. She is aware of palpitations and fatigue. She continues to wear the monitor for 2 more days.  Today, she denies symptoms of  chest pain, shortness of breath, orthopnea, PND, lower extremity edema, dizziness, presyncope, syncope, or neurologic sequela. The patient is tolerating medications without difficulties and is otherwise without complaint today.   Past Medical History:  Diagnosis Date  . Anemia   . Cancer [facility])    Breast cancer  . Chronic combined systolic (congestive) and diastolic (congestive) heart failure (HCC)    a. EF 25-30% by echo in 11/2016. Recent NST showing no inducible ischemia --> therefore thought to be most consistent with nonischemic cardiomyopathy.   . Diabetes mellitus   . History of stroke 11/2016   incidental finding on MRI  . Hypercholesteremia   . Hypertension   . Peripheral vascular disease ([place]) 2015   bilateral moderate CA disease-Dr Bridgett Larsson   Past Surgical History:  Procedure Laterality Date  . ABDOMINAL HYSTERECTOMY    . APPENDECTOMY  1951  . [place]  . CHOLECYSTECTOMY   2003   By Dr. Excell Seltzer  . EYE SURGERY Bilateral 2009   Cataract surgery  . FRACTURE SURGERY Right    ORIF   by Dr. Burney Gauze  . HUMERUS FRACTURE SURGERY    . MASTECTOMY Bilateral   . NASAL SEPTUM SURGERY  1977   Dr. Ernesto Rutherford  . TOENAIL EXCISION      Current Outpatient Medications  Medication Sig Dispense Refill  . apixaban (ELIQUIS) 5 MG TABS tablet Take 1 tablet (5 mg total) by mouth 2 (two) times daily. 60 tablet 5  . aspirin EC 81 MG tablet Take 81 mg by mouth daily.    [name] atorvastatin (LIPITOR) 80 MG tablet Take 80 mg by mouth at bedtime.    . Biotin 100 MG/GM POWD Take 100 mg by mouth daily.    . cholecalciferol (VITAMIN D) 1000 units tablet Take 1,000 Units by mouth every evening.     . fluticasone (FLONASE) 50 MCG/ACT nasal spray Place 1 spray into both nostrils daily as needed for allergies or rhinitis.     . furosemide (LASIX) 40 MG tablet Take 1 tablet (40 mg total) by mouth daily. 90 tablet 3  . meclizine (ANTIVERT) 25 MG tablet TAKE ONE TABLET BY MOUTH THREE TIMES A DAY AS NEEDED FOR DIZZINESS 90 tablet 2  . metoprolol succinate (TOPROL-XL) 25 MG 24 hr tablet Take one tablet in the morning and 1/2 tablet in the evening 90 tablet 3  . Multiple Vitamin (MULTIVITAMIN WITH MINERALS) TABS tablet Take 1 tablet by mouth daily.     [name]  omeprazole (PRILOSEC) 20 MG capsule Take 20 mg by mouth daily.    . sacubitril-valsartan (ENTRESTO) 24-26 MG Take 1 tablet by mouth 2 (two) times daily. 180 tablet 3  . vitamin C (ASCORBIC ACID) 500 MG tablet Take 500 mg by mouth daily.      No current facility-administered medications for this encounter.     Allergies  Allergen Reactions  . Morphine And Related Nausea And Vomiting  . Other Other (See Comments)    Other reaction(s): Other (See Comments) Barley & Carrots: learned through allergy testing. Unknown reaction-- MD said not very allergic but not to eat large quantities Barley & Carrots: learned through allergy testing. Unknown reaction--  MD said not very allergic but not to eat large quantities  . Sulfa Antibiotics Hives  . Sulfasalazine Hives  . Nickel Rash    Social History   Socioeconomic History  . Marital status: Widowed    Spouse name: Not on file  . Number of children: Not on file  . Years of education: Not on file  . Highest education level: Not on file  Occupational History  . Not on file  Social Needs  . Financial resource strain: Not on file  . Food insecurity:    Worry: Not on file    Inability: Not on file  . Transportation needs:    Medical: Not on file    Non-medical: Not on file  Tobacco Use  . Smoking status: Former Smoker    Last attempt to quit: 08/24/1978    Years since quitting: 39.6  . Smokeless tobacco: Never Used  Substance and Sexual Activity  . Alcohol use: No  . Drug use: No  . Sexual activity: Not on file  Lifestyle  . Physical activity:    Days per week: Not on file    Minutes per session: Not on file  . Stress: Not on file  Relationships  . Social connections:    Talks on phone: Not on file    Gets together: Not on file    Attends religious service: Not on file    Active member of club or organization: Not on file    Attends meetings of clubs or organizations: Not on file    Relationship status: Not on file  . Intimate partner violence:    Fear of current or ex partner: Not on file    Emotionally abused: Not on file    Physically abused: Not on file    Forced sexual activity: Not on file  Other Topics Concern  . Not on file  Social History Narrative  . Not on file    Family History  Problem Relation Age of Onset  . Cancer Mother   . Pulmonary embolism Mother   . Heart disease Father   . Heart attack Father   . Stroke Father   . Diabetes Brother   . Hyperlipidemia Brother   . Stroke Brother   . Kidney disease Brother     ROS- All systems are reviewed and negative except as per the HPI above  Physical Exam: Vitals:   03/23/18 1552  BP: (!) 152/76    Pulse: (!) 104  Weight: 155 lb (70.3 kg)  Height: 5\' 3"  (1.6 m)   Wt Readings from Last 3 Encounters:  03/23/18 155 lb (70.3 kg)  03/09/18 153 lb (69.4 kg)  07/30/17 139 lb (63 kg)    Labs: Lab Results  Component Value Date   NA 140 03/17/2018   K 4.1  03/17/2018   CL 97 03/17/2018   CO2 26 03/17/2018   GLUCOSE 151 (H) 03/17/2018   BUN 21 03/17/2018   CREATININE 0.89 03/17/2018   CALCIUM 8.9 03/17/2018   PHOS 3.4 03/17/2016   MG 1.2 (L) 03/17/2018   Lab Results  Component Value Date   INR 1.05 12/13/2015   Lab Results  Component Value Date   CHOL 84 11/16/2016   HDL 18 (L) 11/16/2016   LDLCALC 48 11/16/2016   TRIG 91 11/16/2016     GEN- The patient is well appearing, alert and oriented x 3 today.   Head- normocephalic, atraumatic Eyes-  Sclera clear, conjunctiva pink Ears- hearing intact Oropharynx- clear Neck- supple, no JVP Lymph- no cervical lymphadenopathy Lungs- Clear to ausculation bilaterally, normal work of breathing Heart- Regular rate and rhythm, no murmurs, rubs or gallops, PMI not laterally displaced GI- soft, NT, ND, + BS Extremities- no clubbing, cyanosis, or edema MS- no significant deformity or atrophy Skin- no rash or lesion Psych- euthymic mood, full affect Neuro- strength and sensation are intact  EKG-Probable atypical tach at 104 bpm, vrs atrial tach at 104 bpm,  Echo-Study Conclusions  - Left ventricle: The cavity size was normal. Wall thickness was   increased in a pattern of mild LVH. Systolic function was   severely reduced. The estimated ejection fraction was in the   range of 25% to 30%. Diffuse hypokinesis. Doppler parameters are   consistent with restrictive physiology, indicative of decreased   left ventricular diastolic compliance and/or increased left   atrial pressure. Doppler parameters are consistent with high   ventricular filling pressure. - Aortic valve: Valve mobility was restricted. There was trivial    regurgitation. - Mitral valve: Calcified annulus. Mildly thickened leaflets .   There was mild regurgitation. - Left atrium: The atrium was mildly dilated. - Right ventricle: The cavity size was mildly dilated. Systolic   function was mildly reduced. - Right atrium: The atrium was mildly dilated. - Pulmonary arteries: Systolic pressure was moderately increased.   PA peak pressure: 53 mm Hg (S). - Pericardium, extracardiac: A trivial pericardial effusion was   identified.  Impressions:  - Severe global reduction in LV systolic function; restrictive   filling; trace AI; mild MR; mild biatrial enlargement; mild RVE   with mildly reduced RV function; mild TR with moderately elevated   pulmonary pressure.    Assessment and Plan: 1. Palpitations/ new onset atrial flutter Increase metoprolol to an additional 1/2 tab at hs General education re afib/flutter Triggers discussed Continue monitor  2. Chadsvasc score of at least 7 Continue eliquis 5 mg bid Dr. Oval Linsey asked pt to start ASA, she is off plavix I will confirm if she is to continue ASA long term Bleeding precautions discussed  F/u in 10 days  Geroge Baseman. Danisa Kopec, [facility] [address] [place], [place] [id number] [phone]

## 2018-04-04 ENCOUNTER — Encounter (HOSPITAL_COMMUNITY): Payer: Self-pay | Admitting: Nurse Practitioner

## 2018-04-04 ENCOUNTER — Ambulatory Visit (HOSPITAL_COMMUNITY)
Admission: RE | Admit: 2018-04-04 | Discharge: 2018-04-04 | Disposition: A | Discharge status: Home / Self Care | Payer: Medicare Other | Source: Ambulatory Visit | Attending: Nurse Practitioner | Admitting: Nurse Practitioner

## 2018-04-04 VITALS — BP 128/74 | HR 71 | Ht 63.0 in | Wt 155.0 lb

## 2018-04-04 DIAGNOSIS — E7800 Pure hypercholesterolemia, unspecified: Secondary | ICD-10-CM | POA: Insufficient documentation

## 2018-04-04 DIAGNOSIS — I480 Paroxysmal atrial fibrillation: Secondary | ICD-10-CM | POA: Diagnosis not present

## 2018-04-04 DIAGNOSIS — Z7901 Long term (current) use of anticoagulants: Secondary | ICD-10-CM | POA: Insufficient documentation

## 2018-04-04 DIAGNOSIS — Z841 Family history of disorders of kidney and ureter: Secondary | ICD-10-CM | POA: Insufficient documentation

## 2018-04-04 DIAGNOSIS — Z90710 Acquired absence of both cervix and uterus: Secondary | ICD-10-CM | POA: Diagnosis not present

## 2018-04-04 DIAGNOSIS — Z9049 Acquired absence of other specified parts of digestive tract: Secondary | ICD-10-CM | POA: Diagnosis not present

## 2018-04-04 DIAGNOSIS — Z8673 Personal history of transient ischemic attack (TIA), and cerebral infarction without residual deficits: Secondary | ICD-10-CM | POA: Diagnosis not present

## 2018-04-04 DIAGNOSIS — I779 Disorder of arteries and arterioles, unspecified: Secondary | ICD-10-CM | POA: Diagnosis not present

## 2018-04-04 DIAGNOSIS — Z9013 Acquired absence of bilateral breasts and nipples: Secondary | ICD-10-CM | POA: Diagnosis not present

## 2018-04-04 DIAGNOSIS — Z882 Allergy status to sulfonamides status: Secondary | ICD-10-CM | POA: Insufficient documentation

## 2018-04-04 DIAGNOSIS — R002 Palpitations: Secondary | ICD-10-CM | POA: Diagnosis not present

## 2018-04-04 DIAGNOSIS — Z885 Allergy status to narcotic agent status: Secondary | ICD-10-CM | POA: Insufficient documentation

## 2018-04-04 DIAGNOSIS — Z833 Family history of diabetes mellitus: Secondary | ICD-10-CM | POA: Insufficient documentation

## 2018-04-04 DIAGNOSIS — Z853 Personal history of malignant neoplasm of breast: Secondary | ICD-10-CM | POA: Insufficient documentation

## 2018-04-04 DIAGNOSIS — I5042 Chronic combined systolic (congestive) and diastolic (congestive) heart failure: Secondary | ICD-10-CM | POA: Diagnosis not present

## 2018-04-04 DIAGNOSIS — I4892 Unspecified atrial flutter: Secondary | ICD-10-CM | POA: Insufficient documentation

## 2018-04-04 DIAGNOSIS — Z8249 Family history of ischemic heart disease and other diseases of the circulatory system: Secondary | ICD-10-CM | POA: Diagnosis not present

## 2018-04-04 DIAGNOSIS — Z79899 Other long term (current) drug therapy: Secondary | ICD-10-CM | POA: Insufficient documentation

## 2018-04-04 DIAGNOSIS — Z87891 Personal history of nicotine dependence: Secondary | ICD-10-CM | POA: Insufficient documentation

## 2018-04-04 DIAGNOSIS — I110 Hypertensive heart disease with heart failure: Secondary | ICD-10-CM | POA: Insufficient documentation

## 2018-04-04 DIAGNOSIS — Z823 Family history of stroke: Secondary | ICD-10-CM | POA: Insufficient documentation

## 2018-04-04 DIAGNOSIS — Z7982 Long term (current) use of aspirin: Secondary | ICD-10-CM | POA: Insufficient documentation

## 2018-04-04 DIAGNOSIS — E1151 Type 2 diabetes mellitus with diabetic peripheral angiopathy without gangrene: Secondary | ICD-10-CM | POA: Diagnosis not present

## 2018-04-04 DIAGNOSIS — Z9221 Personal history of antineoplastic chemotherapy: Secondary | ICD-10-CM | POA: Diagnosis not present

## 2018-04-04 NOTE — Progress Notes (Signed)
Primary Care Physician: Vernie Shanks, MD Referring Physician: Dr. Jeralyn Bennett Breshae Helen [name] is a 84 y.o. female with a h/o with chronic systolic and diastolic heart failure, hypertension, hyperlipidemia, diabetes, prior stroke, CAD, carotid stenosis and breast cancer s/p chemotherapy and double mastectomy in the afib clinic for evaluation.  She saw Dr. Oval Linsey, 7/17 and mentioned she was having palpitations. Monitor was placed and showed atrial flutter. She has noted increase of HR up to low 100's intermittently during the day. EKG shows either atrial tach at 104 beats per minute or atypical atrial tach at 104 bpm.  Dr. Oval Linsey started eliquis and asa and stopped Plavix that pt was on for h/o TIA. She is already on metoprolol 25 mg daily which dose was reduced in the recent past for dizziness. She does not drink alcohol, mod caffeine intake, denies snoring. She is aware of palpitations and fatigue. She continues to wear the monitor for 2 more days.  F/u in afib clinic, 8/12. She was started on an extra 1/2 of metoprolol at hs to smooth over some of her palpitations and she feels improved. Results of the monitor reviewed with pt and showed primarily SR. She is currently on eliquis, tolerating and a baby asa , which she will continue for carotid disease.   Today, she denies symptoms of  chest pain, shortness of breath, orthopnea, PND, lower extremity edema, dizziness, presyncope, syncope, or neurologic sequela. The patient is tolerating medications without difficulties and is otherwise without complaint today.   Past Medical History:  Diagnosis Date  . Anemia   . Cancer [facility])    Breast cancer  . Chronic combined systolic (congestive) and diastolic (congestive) heart failure (HCC)    a. EF 25-30% by echo in 11/2016. Recent NST showing no inducible ischemia --> therefore thought to be most consistent with nonischemic cardiomyopathy.   . Diabetes mellitus   . History of stroke 11/2016   incidental finding on MRI  . Hypercholesteremia   . Hypertension   . Peripheral vascular disease ([place]) 2015   bilateral moderate CA disease-Dr Bridgett Larsson   Past Surgical History:  Procedure Laterality Date  . ABDOMINAL HYSTERECTOMY    . APPENDECTOMY  1951  . [place]  . CHOLECYSTECTOMY  2003   By Dr. Excell Seltzer  . EYE SURGERY Bilateral 2009   Cataract surgery  . FRACTURE SURGERY Right    ORIF   by Dr. Burney Gauze  . HUMERUS FRACTURE SURGERY    . MASTECTOMY Bilateral   . NASAL SEPTUM SURGERY  1977   Dr. Ernesto Rutherford  . TOENAIL EXCISION      Current Outpatient Medications  Medication Sig Dispense Refill  . apixaban (ELIQUIS) 5 MG TABS tablet Take 1 tablet (5 mg total) by mouth 2 (two) times daily. 60 tablet 5  . aspirin EC 81 MG tablet Take 81 mg by mouth daily.    [name] atorvastatin (LIPITOR) 80 MG tablet Take 80 mg by mouth at bedtime.    . Biotin 100 MG/GM POWD Take 100 mg by mouth daily.    . cholecalciferol (VITAMIN D) 1000 units tablet Take 1,000 Units by mouth every evening.     . fluticasone (FLONASE) 50 MCG/ACT nasal spray Place 1 spray into both nostrils daily as needed for allergies or rhinitis.     . furosemide (LASIX) 40 MG tablet Take 1 tablet (40 mg total) by mouth daily. 90 tablet 3  . magnesium oxide (MAG-OX) 400 MG tablet 1 tablet by  mouth twice a day for 1 week and then 1 daily 40 tablet 3  . meclizine (ANTIVERT) 25 MG tablet TAKE ONE TABLET BY MOUTH THREE TIMES A DAY AS NEEDED FOR DIZZINESS 90 tablet 2  . metoprolol succinate (TOPROL-XL) 25 MG 24 hr tablet Take one tablet in the morning and 1/2 tablet in the evening 90 tablet 3  . Multiple Vitamin (MULTIVITAMIN WITH MINERALS) TABS tablet Take 1 tablet by mouth daily.     [name] omeprazole (PRILOSEC) 20 MG capsule Take 20 mg by mouth daily.    . sacubitril-valsartan (ENTRESTO) 24-26 MG Take 1 tablet by mouth 2 (two) times daily. 180 tablet 3  . vitamin C (ASCORBIC ACID) 500 MG tablet Take 500 mg by mouth daily.       No current facility-administered medications for this encounter.     Allergies  Allergen Reactions  . Morphine And Related Nausea And Vomiting  . Other Other (See Comments)    Other reaction(s): Other (See Comments) Barley & Carrots: learned through allergy testing. Unknown reaction-- MD said not very allergic but not to eat large quantities Barley & Carrots: learned through allergy testing. Unknown reaction-- MD said not very allergic but not to eat large quantities  . Sulfa Antibiotics Hives  . Sulfasalazine Hives  . Nickel Rash    Social History   Socioeconomic History  . Marital status: Widowed    Spouse name: Not on file  . Number of children: Not on file  . Years of education: Not on file  . Highest education level: Not on file  Occupational History  . Not on file  Social Needs  . Financial resource strain: Not on file  . Food insecurity:    Worry: Not on file    Inability: Not on file  . Transportation needs:    Medical: Not on file    Non-medical: Not on file  Tobacco Use  . Smoking status: Former Smoker    Last attempt to quit: 08/24/1978    Years since quitting: 39.6  . Smokeless tobacco: Never Used  Substance and Sexual Activity  . Alcohol use: No  . Drug use: No  . Sexual activity: Not on file  Lifestyle  . Physical activity:    Days per week: Not on file    Minutes per session: Not on file  . Stress: Not on file  Relationships  . Social connections:    Talks on phone: Not on file    Gets together: Not on file    Attends religious service: Not on file    Active member of club or organization: Not on file    Attends meetings of clubs or organizations: Not on file    Relationship status: Not on file  . Intimate partner violence:    Fear of current or ex partner: Not on file    Emotionally abused: Not on file    Physically abused: Not on file    Forced sexual activity: Not on file  Other Topics Concern  . Not on file  Social History Narrative    . Not on file    Family History  Problem Relation Age of Onset  . Cancer Mother   . Pulmonary embolism Mother   . Heart disease Father   . Heart attack Father   . Stroke Father   . Diabetes Brother   . Hyperlipidemia Brother   . Stroke Brother   . Kidney disease Brother     ROS- All systems are  reviewed and negative except as per the HPI above  Physical Exam: Vitals:   04/04/18 1448  BP: 128/74  Pulse: 71  Weight: 70.3 kg  Height: 5\' 3"  (1.6 m)   Wt Readings from Last 3 Encounters:  04/04/18 70.3 kg  03/23/18 70.3 kg  03/09/18 69.4 kg    Labs: Lab Results  Component Value Date   NA 140 03/17/2018   K 4.1 03/17/2018   CL 97 03/17/2018   CO2 26 03/17/2018   GLUCOSE 151 (H) 03/17/2018   BUN 21 03/17/2018   CREATININE 0.89 03/17/2018   CALCIUM 8.9 03/17/2018   PHOS 3.4 03/17/2016   MG 1.2 (L) 03/17/2018   Lab Results  Component Value Date   INR 1.05 12/13/2015   Lab Results  Component Value Date   CHOL 84 11/16/2016   HDL 18 (L) 11/16/2016   LDLCALC 48 11/16/2016   TRIG 91 11/16/2016     GEN- The patient is well appearing, alert and oriented x 3 today.   Head- normocephalic, atraumatic Eyes-  Sclera clear, conjunctiva pink Ears- hearing intact Oropharynx- clear Neck- supple, no JVP Lymph- no cervical lymphadenopathy Lungs- Clear to ausculation bilaterally, normal work of breathing Heart- Regular rate and rhythm, no murmurs, rubs or gallops, PMI not laterally displaced GI- soft, NT, ND, + BS Extremities- no clubbing, cyanosis, or edema MS- no significant deformity or atrophy Skin- no rash or lesion Psych- euthymic mood, full affect Neuro- strength and sensation are intact  EKG-Sinus rhythm with first degree AV block with a rare PVC, v rate 71 bpm Echo-Study Conclusions  - Left ventricle: The cavity size was normal. Wall thickness was   increased in a pattern of mild LVH. Systolic function was   severely reduced. The estimated ejection  fraction was in the   range of 25% to 30%. Diffuse hypokinesis. Doppler parameters are   consistent with restrictive physiology, indicative of decreased   left ventricular diastolic compliance and/or increased left   atrial pressure. Doppler parameters are consistent with high   ventricular filling pressure. - Aortic valve: Valve mobility was restricted. There was trivial   regurgitation. - Mitral valve: Calcified annulus. Mildly thickened leaflets .   There was mild regurgitation. - Left atrium: The atrium was mildly dilated. - Right ventricle: The cavity size was mildly dilated. Systolic   function was mildly reduced. - Right atrium: The atrium was mildly dilated. - Pulmonary arteries: Systolic pressure was moderately increased.   PA peak pressure: 53 mm Hg (S). - Pericardium, extracardiac: A trivial pericardial effusion was   identified.  Impressions:  - Severe global reduction in LV systolic function; restrictive   filling; trace AI; mild MR; mild biatrial enlargement; mild RVE   with mildly reduced RV function; mild TR with moderately elevated   pulmonary pressure.  7 Day Event Monitor-8/8  Quality: Fair.  Baseline artifact. Predominant rhythm: sinus rhythm Average heart rate: 82 bpm Max heart rate: 125 bpm Min heart rate: 57 bpm  Occasional PACs and PVCs 9 beats of NSVT Atrial fibrillation ventricular rate 70 bpm.  Tiffany C. Oval Linsey, MD, [facility] 03/31/2018 8:25 PM  Assessment and Plan: 1. Palpitations/ new onset atrial flutter Improved with an extra 1/2 dose of BB Continue metoprolol to an additional 1/2 tab at hs, 25 mg in am General education re afib/flutter Triggers discussed  2. Chadsvasc score of at least 7 Continue eliquis 5 mg bid She is to continue ASA per Dr. Oval Linsey for her carotid disease  F/u with  Dr. Oval Linsey in October as scheduled afib clinic as needed    Geroge Baseman. Khalon Cansler, [facility] [address] [place], [place] [address] [phone]

## 2018-04-12 ENCOUNTER — Telehealth: Payer: Self-pay | Admitting: Cardiovascular Disease

## 2018-04-12 NOTE — Telephone Encounter (Signed)
New Message:  Pt states she is having rectal bleeding/ ask the pt if she contacted her PCP  Please return call

## 2018-04-12 NOTE — Telephone Encounter (Signed)
Returned call to patient.She stated she has been having small amounts of rectal bleeding after a bowel movement.Stated she has noticed 3 to 4 times since the end of July.She has also noticed she has been more fatigued,no energy.Advised to see PCP.

## 2018-04-18 DIAGNOSIS — Z7901 Long term (current) use of anticoagulants: Secondary | ICD-10-CM | POA: Diagnosis not present

## 2018-04-18 DIAGNOSIS — K644 Residual hemorrhoidal skin tags: Secondary | ICD-10-CM | POA: Diagnosis not present

## 2018-04-18 DIAGNOSIS — Z5181 Encounter for therapeutic drug level monitoring: Secondary | ICD-10-CM | POA: Diagnosis not present

## 2018-04-18 DIAGNOSIS — K640 First degree hemorrhoids: Secondary | ICD-10-CM | POA: Diagnosis not present

## 2018-04-27 ENCOUNTER — Other Ambulatory Visit: Payer: Self-pay | Admitting: Cardiovascular Disease

## 2018-04-29 ENCOUNTER — Telehealth: Payer: Self-pay | Admitting: *Deleted

## 2018-04-29 NOTE — Telephone Encounter (Signed)
Received a call from Dr Jodi Mourning nurse regarding patient being on Plavix and Eliquis together. Advised patient was told to stop Plavix and start ASA 81 mg daily when the Eliquis was started

## 2018-05-12 DIAGNOSIS — I639 Cerebral infarction, unspecified: Secondary | ICD-10-CM | POA: Diagnosis not present

## 2018-05-12 DIAGNOSIS — E119 Type 2 diabetes mellitus without complications: Secondary | ICD-10-CM | POA: Diagnosis not present

## 2018-05-12 DIAGNOSIS — Z23 Encounter for immunization: Secondary | ICD-10-CM | POA: Diagnosis not present

## 2018-05-12 DIAGNOSIS — R42 Dizziness and giddiness: Secondary | ICD-10-CM | POA: Diagnosis not present

## 2018-05-12 DIAGNOSIS — R350 Frequency of micturition: Secondary | ICD-10-CM | POA: Diagnosis not present

## 2018-05-12 DIAGNOSIS — I10 Essential (primary) hypertension: Secondary | ICD-10-CM | POA: Diagnosis not present

## 2018-05-12 DIAGNOSIS — Z7901 Long term (current) use of anticoagulants: Secondary | ICD-10-CM | POA: Diagnosis not present

## 2018-05-12 DIAGNOSIS — I509 Heart failure, unspecified: Secondary | ICD-10-CM | POA: Diagnosis not present

## 2018-05-16 DIAGNOSIS — I10 Essential (primary) hypertension: Secondary | ICD-10-CM | POA: Diagnosis not present

## 2018-05-16 DIAGNOSIS — R42 Dizziness and giddiness: Secondary | ICD-10-CM | POA: Diagnosis not present

## 2018-05-16 DIAGNOSIS — I509 Heart failure, unspecified: Secondary | ICD-10-CM | POA: Diagnosis not present

## 2018-05-16 DIAGNOSIS — R350 Frequency of micturition: Secondary | ICD-10-CM | POA: Diagnosis not present

## 2018-05-16 DIAGNOSIS — Z7901 Long term (current) use of anticoagulants: Secondary | ICD-10-CM | POA: Diagnosis not present

## 2018-05-16 DIAGNOSIS — I639 Cerebral infarction, unspecified: Secondary | ICD-10-CM | POA: Diagnosis not present

## 2018-05-16 DIAGNOSIS — Z23 Encounter for immunization: Secondary | ICD-10-CM | POA: Diagnosis not present

## 2018-05-16 DIAGNOSIS — E119 Type 2 diabetes mellitus without complications: Secondary | ICD-10-CM | POA: Diagnosis not present

## 2018-06-10 ENCOUNTER — Ambulatory Visit: Payer: Self-pay | Admitting: Cardiovascular Disease

## 2018-06-13 DIAGNOSIS — H9319 Tinnitus, unspecified ear: Secondary | ICD-10-CM | POA: Diagnosis not present

## 2018-06-13 DIAGNOSIS — R42 Dizziness and giddiness: Secondary | ICD-10-CM | POA: Diagnosis not present

## 2018-06-13 DIAGNOSIS — H903 Sensorineural hearing loss, bilateral: Secondary | ICD-10-CM | POA: Diagnosis not present

## 2018-06-16 ENCOUNTER — Ambulatory Visit (INDEPENDENT_AMBULATORY_CARE_PROVIDER_SITE_OTHER): Payer: Medicare Other | Admitting: Cardiovascular Disease

## 2018-06-16 ENCOUNTER — Encounter: Payer: Self-pay | Admitting: Cardiovascular Disease

## 2018-06-16 VITALS — BP 142/62 | HR 76 | Ht 63.0 in | Wt 154.0 lb

## 2018-06-16 DIAGNOSIS — I5042 Chronic combined systolic (congestive) and diastolic (congestive) heart failure: Secondary | ICD-10-CM

## 2018-06-16 DIAGNOSIS — I10 Essential (primary) hypertension: Secondary | ICD-10-CM | POA: Diagnosis not present

## 2018-06-16 DIAGNOSIS — R42 Dizziness and giddiness: Secondary | ICD-10-CM

## 2018-06-16 DIAGNOSIS — I739 Peripheral vascular disease, unspecified: Secondary | ICD-10-CM | POA: Diagnosis not present

## 2018-06-16 DIAGNOSIS — I779 Disorder of arteries and arterioles, unspecified: Secondary | ICD-10-CM

## 2018-06-16 MED ORDER — RIVAROXABAN 20 MG PO TABS: 20.0000 mg | ORAL_TABLET | Freq: Every day | ORAL | 5 refills | Status: DC

## 2018-06-16 NOTE — Progress Notes (Signed)
Cardiology Office Note   Date:  06/16/2018   ID:  Helen [name], DOB 05-17-1930, MRN [id number]  PCP:  Vernie Shanks, MD  Cardiologist:   Skeet Latch, MD   Chief Complaint  Patient presents with  . Follow-up     History of Present Illness: Helen [name] is a 84 y.o. female with chronic systolic and diastolic heart failure, paroxysmal atrial fibrillation, hypertension, hyperlipidemia, diabetes, prior stroke, CAD, carotid stenosis and breast cancer s/p chemotherapy and double mastectomy who presents for follow up on heart failure.  [name] was admitted 02/2016 with hyponatremia.  That hospitalization she had an echo that revealed LVEF LVEF 40-45% with global hypokinesis worse in the basal-mid anteroseptal and inferoseptal regions, moderately elevated pulmonary pressures, and grade 2 diastolic dysfunction.  She was not evaluated by cardiology at the time.  Since then she has remained on supplemental oxygen.  BNP was noted to be 1500, So she was referred to cardiology for evaluation 10/08/16. EKG was notable for diffuse T wave inversions. She was referred for [facility] 10/16/16 that revealed LVEF 30% with a fixed defect in the inferior wall felt to be diaphragmatic attenuation.  She was admitted 10/2016 with heart failure and acute on chronic respiratory failure. BNP was 1300. Cardiology was not consulted. She followed up with [name], PA-C on 12/08/16 and was referred for repeat echocardiogram. Echo revealed LVEF 25-30% with diffuse hypokinesis and elevated left ventricular filling pressures. She also had moderate pulmonary hypertension.  She was started on Entresto and had improvement in edema.    At her last appointment [name] reported intermittent palpitations over the last few weeks.  She wondered if it may be due to stress and anxiety.  [name] wore an event monitor 02/2018 that revealed PAF.  Metoprolol was increased.  She was seen in atrial for ablation clinic and  seem to be doing better.  She sometimes feels her heart beating when she is lying in bed at night.  It seems to improve when she gets up and sits in a chair for a while.  She was also started on Eliquis.  Since that time she has noted several symptoms that she attributes to the Eliquis.  Although she has had chronic dizziness it seems to be worse.  She is also felt pruritus and tongue swelling.  She had a couple days of rectal bleeding but was find to have hemorrhoids.  This has resolved.  She is most concerned about her dizziness.  She feels so off balance when she walks that she has to hold onto the walls.  She uses a walker she is not walking with her daughter.  She has seen several ENTs and has been told that nothing is wrong.  She reports chronic tinnitus.  The dizziness is constantly there but seems to be worse when she walks.  She notices that overall her blood pressure is controlled but it seems to be higher whenever she is stressed or anxious.  Past Medical History:  Diagnosis Date  . Anemia   . Cancer [facility])    Breast cancer  . Chronic combined systolic (congestive) and diastolic (congestive) heart failure (HCC)    a. EF 25-30% by echo in 11/2016. Recent NST showing no inducible ischemia --> therefore thought to be most consistent with nonischemic cardiomyopathy.   . Diabetes mellitus   . History of stroke 11/2016   incidental finding on MRI  . Hypercholesteremia   . Hypertension   .  Peripheral vascular disease ([place]) 2015   bilateral moderate CA disease-Dr Bridgett Larsson    Past Surgical History:  Procedure Laterality Date  . ABDOMINAL HYSTERECTOMY    . APPENDECTOMY  1951  . [place]  . CHOLECYSTECTOMY  2003   By Dr. Excell Seltzer  . EYE SURGERY Bilateral 2009   Cataract surgery  . FRACTURE SURGERY Right    ORIF   by Dr. Burney Gauze  . HUMERUS FRACTURE SURGERY    . MASTECTOMY Bilateral   . NASAL SEPTUM SURGERY  1977   Dr. Ernesto Rutherford  . TOENAIL EXCISION       Current  Outpatient Medications  Medication Sig Dispense Refill  . aspirin EC 81 MG tablet Take 81 mg by mouth daily.    [name] atorvastatin (LIPITOR) 80 MG tablet Take 80 mg by mouth at bedtime.    . Biotin 100 MG/GM POWD Take 100 mg by mouth daily.    . cholecalciferol (VITAMIN D) 1000 units tablet Take 1,000 Units by mouth every evening.     . fluticasone (FLONASE) 50 MCG/ACT nasal spray Place 1 spray into both nostrils daily as needed for allergies or rhinitis.     . furosemide (LASIX) 40 MG tablet Take 1 tablet (40 mg total) by mouth daily. 90 tablet 3  . magnesium oxide (MAG-OX) 400 MG tablet 1 tablet by mouth twice a day for 1 week and then 1 daily (Patient taking differently: daily. ) 40 tablet 3  . meclizine (ANTIVERT) 25 MG tablet TAKE ONE TABLET BY MOUTH THREE TIMES A DAY AS NEEDED FOR DIZZINESS 90 tablet 2  . metoprolol succinate (TOPROL-XL) 25 MG 24 hr tablet TAKE 1 TABLET BY MOUTH IN THE MORNING AND (1/2) HALF TABLET IN THE EVENING DAILY 135 tablet 3  . Multiple Vitamin (MULTIVITAMIN WITH MINERALS) TABS tablet Take 1 tablet by mouth daily.     [name] omeprazole (PRILOSEC) 20 MG capsule Take 20 mg by mouth daily.    . pantoprazole (PROTONIX) 40 MG tablet     . sacubitril-valsartan (ENTRESTO) 24-26 MG Take 1 tablet by mouth 2 (two) times daily. 180 tablet 3  . vitamin C (ASCORBIC ACID) 500 MG tablet Take 500 mg by mouth daily.     . rivaroxaban (XARELTO) 20 MG TABS tablet Take 1 tablet (20 mg total) by mouth daily with supper. 30 tablet 5   No current facility-administered medications for this visit.     Allergies:   Morphine and related; Other; Sulfa antibiotics; Sulfasalazine; and Nickel    Social History:  The patient  reports that she quit smoking about 39 years ago. She has never used smokeless tobacco. She reports that she does not drink alcohol or use drugs.   Family History:  The patient's family history includes Cancer in her mother; Diabetes in her brother; Heart attack in her father;  Heart disease in her father; Hyperlipidemia in her brother; Kidney disease in her brother; Pulmonary embolism in her mother; Stroke in her brother and father.    ROS:  Please see the history of present illness.   Otherwise, review of systems are positive for none.   All other systems are reviewed and negative.    PHYSICAL EXAM: VS:  BP (!) 142/62   Pulse 76   Ht 5\' 3"  (1.6 m)   Wt 154 lb (69.9 kg)   BMI 27.28 kg/m  , BMI Body mass index is 27.28 kg/m. GENERAL:  Well appearing HEENT: Pupils equal round and reactive, fundi not visualized,  oral mucosa unremarkable NECK:  No jugular venous distention, waveform within normal limits, carotid upstroke brisk and symmetric, no bruits LUNGS:  Clear to auscultation bilaterally HEART: Regular rate and rhythm . PMI not displaced or sustained,S1 and S2 within normal limits, no S3, no S4, no clicks, no rubs, no murmurs ABD:  Flat, positive bowel sounds normal in frequency in pitch, no bruits, no rebound, no guarding, no midline pulsatile mass, no hepatomegaly, no splenomegaly EXT:  2 plus pulses throughout, no edema, no cyanosis no clubbing SKIN:  No rashes no nodules NEURO:  Cranial nerves II through XII grossly intact, motor grossly intact throughout PSYCH:  Cognitively intact, oriented to person place and time    EKG:  EKG is ordered today. The ekg ordered 10/08/16 demonstrates sinus rhythm rate 76 bpm.  PVCs.  Inferolateral TWI 07/30/17: Sinus rhythm.  Rate 83 bpm.  First degree AV block.  PACs.  Non-specific T wave abnormalities.   03/09/2018: Sinus rhythm.  Rate 85 bpm.  First-degree AV block.  Low voltage precordial leads.  12/21/16: Study Conclusions  - Left ventricle: The cavity size was normal. Wall thickness was   increased in a pattern of mild LVH. Systolic function was   severely reduced. The estimated ejection fraction was in the   range of 25% to 30%. Diffuse hypokinesis. Doppler parameters are   consistent with restrictive  physiology, indicative of decreased   left ventricular diastolic compliance and/or increased left   atrial pressure. Doppler parameters are consistent with high   ventricular filling pressure. - Aortic valve: Valve mobility was restricted. There was trivial   regurgitation. - Mitral valve: Calcified annulus. Mildly thickened leaflets .   There was mild regurgitation. - Left atrium: The atrium was mildly dilated. - Right ventricle: The cavity size was mildly dilated. Systolic   function was mildly reduced. - Right atrium: The atrium was mildly dilated. - Pulmonary arteries: Systolic pressure was moderately increased.   PA peak pressure: 53 mm Hg (S). - Pericardium, extracardiac: A trivial pericardial effusion was   identified.  Carotid Doppler 12/15/15 and 11/06/16:  R and L ICA 1-39%.  L ECA >50%  Lexiscan Myoview 10/16/16:  The left ventricular ejection fraction is moderately decreased (30-44%).  Nuclear stress EF: 30%.  There is a medium defect of moderate severity present in the basal inferior, mid inferior and apical inferior location. The defect is non-reversible and likely secondary to diaphragmatic attenuation artifact and extracardiac tracer uptake as there is no focal wall motion abnormality in this area. No ischemia noted.  This is an intermediate risk study due to LV dysfunction.  There was downsloping ST segment depression in the inferolateral leads at baseline with no change during infusion.  7 Day Event Monitor 03/17/18: Quality: Fair.  Baseline artifact. Predominant rhythm: sinus rhythm Average heart rate: 82 bpm Max heart rate: 125 bpm Min heart rate: 57 bpm  Occasional PACs and PVCs 9 beats of NSVT Atrial fibrillation ventricular rate 70 bpm.  Recent Labs: 03/17/2018: BUN 21; Creatinine, Ser 0.89; Hemoglobin 11.4; Magnesium 1.2; Platelets 249; Potassium 4.1; Sodium 140; TSH 3.950   09/07/16:  BNP 1500 Sodium 137, potassium 4.4, BUN 13, creatinine  0.78 AST 19, ALT 25 WBC 10.1, hemoglobin 13.1, hematocrit 39.7, platelets 240  05/12/18: Total cholesterol 168, triglycerides 244, HDL 33, LDL 86   Lipid Panel    Component Value Date/Time   CHOL 84 11/16/2016 0521   TRIG 91 11/16/2016 0521   HDL 18 (L) 11/16/2016 4709  CHOLHDL 4.7 11/16/2016 0521   VLDL 18 11/16/2016 0521   LDLCALC 48 11/16/2016 0521      Wt Readings from Last 3 Encounters:  06/16/18 154 lb (69.9 kg)  04/04/18 155 lb (70.3 kg)  03/23/18 155 lb (70.3 kg)      ASSESSMENT AND PLAN:  # Chronic systolic and diastolic heart failure:  # Hypertension:  LVEF 25-30%.  No ischemia on Lexiscan Myoview.  She is doing very well on Entresto.  Continue metoprolol and furosemide.   # PACs: # Atrial bigeminy: # PAF: Well controlled on metoprolol.  She is having side effects that she attributes to Eliquis.  We will switch to Xarelto for 2 weeks to see if this helps.   # Carotid stenosis: # Prior TIA:  Ms. Cahoon has moderate carotid stenosis.  Switch Eliquis to Xarelto as above.   # Dizziness: Still symptomatic.  We are trying to see if switching from Eliqus to [place] will help.  If not, she will resume Eliquis and we will try holding atorvastatin for 2 weeks.    Current medicines are reviewed at length with the patient today.  The patient does not have concerns regarding medicines.  The following changes have been made:  Switch Eliquis to Xarelto.   Labs/ tests ordered today include:   No orders of the defined types were placed in this encounter.    Disposition:   FU with Dayanara Sherrill C. Oval Linsey, MD, [facility] in 2 months     Signed, Reneta Niehaus C. Oval Linsey, MD, [facility]  06/16/2018 5:26 PM    [place]

## 2018-06-16 NOTE — Patient Instructions (Addendum)
Medication Instructions:  STOP ELIQUIS   START XARELTO 20 MG IN THE EVENING WITH DINNER   If you need a refill on your cardiac medications before your next appointment, please call your pharmacy.   Lab work: NONE  Testing/Procedures: NONE  Follow-Up: At [facility], you and your health needs are our priority.  As part of our continuing mission to provide you with exceptional heart care, we have created designated Provider Care Teams.  These Care Teams include your primary Cardiologist (physician) and Advanced Practice Providers (APPs -  Physician Assistants and Nurse Practitioners) who all work together to provide you with the care you need, when you need it. You will need a follow up appointment in 2 months. You may see Skeet Latch, MD or one of the following Advanced Practice Providers on your designated Care Team:   [name], PA-C Roby Lofts, [place] . Sande Rives, PA-C  Any Other Special Instructions Will Be Listed Below (If Applicable). IF YOUR SYMPTOMS DO NOT IMPROVE OK TO GO BACK TO ELIQUIS BUT CALL THE OFFICE TO LET DR Mabank KNOW.

## 2018-06-20 ENCOUNTER — Telehealth: Payer: Self-pay | Admitting: Cardiovascular Disease

## 2018-06-20 NOTE — Telephone Encounter (Signed)
Pt c/o medication issue:  1. Name of Medication: Xarelto  2. How are you currently taking this medication (dosage and times per day)?  1 time a day at night  3. Are you having a reaction (difficulty breathing--STAT)? no  4. What is your medication issue? Rectal bleeding when she have a bowel movement, and when sitting on the commode

## 2018-06-20 NOTE — Telephone Encounter (Signed)
Spoke to patient. She states she has one episode this weekend - small amount of bright red blood. She had another one this morning after going to bathroom. Patient states she does have an active hemorrhoids per Dr Jacelyn Grip (GI). Patient states she saw GI doctor the first of this month Oct or end of Sept. Patient takes Xarelto 20 mg at suppertime.  Patient states she had bleeding episodes  aware will defer to Dr Oval Linsey

## 2018-06-23 NOTE — Telephone Encounter (Signed)
Advised patient, verbalized understanding  

## 2018-06-23 NOTE — Telephone Encounter (Signed)
Hold for a few days until bleeding stops and then resume.  If it is only a small amount of blood and there is a known source, no need to panic.

## 2018-07-13 ENCOUNTER — Telehealth: Payer: Self-pay | Admitting: Cardiovascular Disease

## 2018-07-13 NOTE — Telephone Encounter (Signed)
It does not matter which she is on.  Equally effective.  She may have a prescription for whichever she prefers/is least expensive for her.

## 2018-07-13 NOTE — Telephone Encounter (Signed)
New Message:   Patient calling about some medication. Patient said she answer my chart. Please call back. Patient go down to diner about 5:10 pm

## 2018-07-13 NOTE — Telephone Encounter (Signed)
Patient calling to follow up on mychart messages and what to do in regards to Eliquis vs Xarelto. Will forward to Dr Oval Linsey for review

## 2018-07-14 NOTE — Telephone Encounter (Signed)
Advised patient and she will check prices and call back for Rx to be sent in.

## 2018-07-15 ENCOUNTER — Encounter: Payer: Self-pay | Admitting: Cardiovascular Disease

## 2018-07-15 MED ORDER — APIXABAN 5 MG PO TABS: 5.0000 mg | ORAL_TABLET | Freq: Two times a day (BID) | ORAL | 1 refills | Status: DC

## 2018-07-15 NOTE — Addendum Note (Signed)
Addended by: Alvina Filbert B on: 07/15/2018 03:51 PM   Modules accepted: Orders

## 2018-07-15 NOTE — Telephone Encounter (Signed)
Spoke with patient and she wants to restart Eliquis when she finishes current supply of Xarelto Rx sent to mail order

## 2018-07-15 NOTE — Telephone Encounter (Signed)
This encounter was created in error - please disregard.

## 2018-07-15 NOTE — Telephone Encounter (Signed)
Follow Up:    Pt says she have decided to go back on the Eliquis.

## 2018-09-06 ENCOUNTER — Encounter: Payer: Self-pay | Admitting: Cardiovascular Disease

## 2018-09-06 ENCOUNTER — Ambulatory Visit (INDEPENDENT_AMBULATORY_CARE_PROVIDER_SITE_OTHER): Payer: Medicare Other | Admitting: Cardiovascular Disease

## 2018-09-06 VITALS — BP 114/54 | HR 71 | Wt 152.6 lb

## 2018-09-06 DIAGNOSIS — I110 Hypertensive heart disease with heart failure: Secondary | ICD-10-CM | POA: Diagnosis not present

## 2018-09-06 DIAGNOSIS — R002 Palpitations: Secondary | ICD-10-CM | POA: Diagnosis not present

## 2018-09-06 DIAGNOSIS — I2510 Atherosclerotic heart disease of native coronary artery without angina pectoris: Secondary | ICD-10-CM

## 2018-09-06 DIAGNOSIS — I5042 Chronic combined systolic (congestive) and diastolic (congestive) heart failure: Secondary | ICD-10-CM | POA: Diagnosis not present

## 2018-09-06 DIAGNOSIS — I480 Paroxysmal atrial fibrillation: Secondary | ICD-10-CM

## 2018-09-06 NOTE — Patient Instructions (Signed)
Medication Instructions:  Your physician recommends that you continue on your current medications as directed. Please refer to the Current Medication list given to you today.  If you need a refill on your cardiac medications before your next appointment, please call your pharmacy.   Lab work: NONE  Testing/Procedures: NONE  Follow-Up: At [facility], you and your health needs are our priority.  As part of our continuing mission to provide you with exceptional heart care, we have created designated Provider Care Teams.  These Care Teams include your primary Cardiologist (physician) and Advanced Practice Providers (APPs -  Physician Assistants and Nurse Practitioners) who all work together to provide you with the care you need, when you need it. You will need a follow up appointment in 6 months.  Please call our office 2 months in advance to schedule this appointment.  You may see one of the following Advanced Practice Providers on your designated Care Team:   [name], PA-C Roby Lofts, [place] . Sande Rives, PA-C You will need a follow up appointment in 12 months WITH DR [place].  Please call our office 2 months in advance to schedule this appointment

## 2018-09-06 NOTE — Progress Notes (Signed)
Cardiology Office Note   Date:  09/06/2018   ID:  Helen [name], DOB 1929/10/12, MRN [id number]  PCP:  Vernie Shanks, MD  Cardiologist:   Skeet Latch, MD   No chief complaint on file.    History of Present Illness: Helen [name] is a 85 y.o. female with chronic systolic and diastolic heart failure, paroxysmal atrial fibrillation, hypertension, hyperlipidemia, diabetes, prior stroke, CAD, carotid stenosis and breast cancer s/p chemotherapy and double mastectomy who presents for follow up on heart failure.  Helen [name] was admitted 02/2016 with hyponatremia.  That hospitalization she had an echo that revealed LVEF LVEF 40-45% with global hypokinesis worse in the basal-mid anteroseptal and inferoseptal regions, moderately elevated pulmonary pressures, and grade 2 diastolic dysfunction.  She was not evaluated by cardiology at the time.  Since then she has remained on supplemental oxygen.  BNP was noted to be 1500, So she was referred to cardiology for evaluation 10/08/16. EKG was notable for diffuse T wave inversions. She was referred for [facility] 10/16/16 that revealed LVEF 30% with a fixed defect in the inferior wall felt to be diaphragmatic attenuation.  She was admitted 10/2016 with heart failure and acute on chronic respiratory failure. BNP was 1300. Cardiology was not consulted. She followed up with [name], PA-C on 12/08/16 and was referred for repeat echocardiogram. Echo revealed LVEF 25-30% with diffuse hypokinesis and elevated left ventricular filling pressures. She also had moderate pulmonary hypertension.  She was started on Entresto and had improvement in edema.    At her last appointment Helen [name] reported intermittent palpitations over the last few weeks.  She wondered if it may be due to stress and anxiety.  Helen [name] wore an event monitor 02/2018 that revealed PAF.  Metoprolol was increased.  She was seen in atrial for ablation clinic and seem to be doing better.  She  sometimes feels her heart beating when she is lying in bed at night.  It seems to improve when she gets up and sits in a chair for a while.  She was also started on Eliquis.  Since that time she has noted several symptoms that she attributes to the Eliquis.  Although she has had chronic dizziness it seems to be worse.  She is also felt pruritus and tongue swelling.  She had a couple days of rectal bleeding but was find to have hemorrhoids.  This has resolved.  She is most concerned about her dizziness.  She feels so off balance when she walks that she has to hold onto the walls.  She uses a walker she is not walking with her daughter.  She has seen several ENTs and has been told that nothing is wrong.  She reports chronic tinnitus.  The dizziness is constantly there but seems to be worse when she walks.  She notices that overall her blood pressure is controlled but it seems to be higher whenever she is stressed or anxious.  At her last appointment Helen [name] reported dizziness tht she attributed to Eliquis.  She switched to Xarelto but had no improvement so she switched back. She has been feeling well other than a bad URI.  This worsened her tinnitus.  She has no chest pain and her breathing has been stable.  She denies lower extremity edema, orthopnea or PND.  Her son had a stroke after Christmas that was attributable to high blood pressure.  She has been feeling more anxious lately and sometimes wakes up  with palpitations.  She doesn't have them with exertion.  She has noted more energy lately and is going out for meals more frequently.  Past Medical History:  Diagnosis Date  . Anemia   . Cancer [facility])    Breast cancer  . Chronic combined systolic (congestive) and diastolic (congestive) heart failure (HCC)    a. EF 25-30% by echo in 11/2016. Recent NST showing no inducible ischemia --> therefore thought to be most consistent with nonischemic cardiomyopathy.   . Diabetes mellitus   . History of stroke  11/2016   incidental finding on MRI  . Hypercholesteremia   . Hypertension   . Peripheral vascular disease ([place]) 2015   bilateral moderate CA disease-Dr Bridgett Larsson    Past Surgical History:  Procedure Laterality Date  . ABDOMINAL HYSTERECTOMY    . APPENDECTOMY  1951  . [place]  . CHOLECYSTECTOMY  2003   By Dr. Excell Seltzer  . EYE SURGERY Bilateral 2009   Cataract surgery  . FRACTURE SURGERY Right    ORIF   by Dr. Burney Gauze  . HUMERUS FRACTURE SURGERY    . MASTECTOMY Bilateral   . NASAL SEPTUM SURGERY  1977   Dr. Ernesto Rutherford  . TOENAIL EXCISION       Current Outpatient Medications  Medication Sig Dispense Refill  . apixaban (ELIQUIS) 5 MG TABS tablet Take 1 tablet (5 mg total) by mouth 2 (two) times daily. 180 tablet 1  . aspirin EC 81 MG tablet Take 81 mg by mouth daily.    [name] atorvastatin (LIPITOR) 80 MG tablet Take 80 mg by mouth at bedtime.    . Biotin 100 MG/GM POWD Take 100 mg by mouth daily.    . cholecalciferol (VITAMIN D) 1000 units tablet Take 1,000 Units by mouth every evening.     . fluticasone (FLONASE) 50 MCG/ACT nasal spray Place 1 spray into both nostrils daily as needed for allergies or rhinitis.     . furosemide (LASIX) 40 MG tablet Take 1 tablet (40 mg total) by mouth daily. 90 tablet 3  . magnesium oxide (MAG-OX) 400 MG tablet 1 tablet by mouth twice a day for 1 week and then 1 daily (Patient taking differently: daily. ) 40 tablet 3  . meclizine (ANTIVERT) 25 MG tablet TAKE ONE TABLET BY MOUTH THREE TIMES A DAY AS NEEDED FOR DIZZINESS 90 tablet 2  . metoprolol succinate (TOPROL-XL) 25 MG 24 hr tablet TAKE 1 TABLET BY MOUTH IN THE MORNING AND (1/2) HALF TABLET IN THE EVENING DAILY 135 tablet 3  . Multiple Vitamin (MULTIVITAMIN WITH MINERALS) TABS tablet Take 1 tablet by mouth daily.     . pantoprazole (PROTONIX) 40 MG tablet     . sacubitril-valsartan (ENTRESTO) 24-26 MG Take 1 tablet by mouth 2 (two) times daily. 180 tablet 3  . vitamin C (ASCORBIC  ACID) 500 MG tablet Take 500 mg by mouth daily.      No current facility-administered medications for this visit.     Allergies:   Morphine and related; Other; Sulfa antibiotics; Sulfasalazine; and Nickel    Social History:  The patient  reports that she quit smoking about 40 years ago. She has never used smokeless tobacco. She reports that she does not drink alcohol or use drugs.   Family History:  The patient's family history includes Cancer in her mother; Diabetes in her brother; Heart attack in her father; Heart disease in her father; Hyperlipidemia in her brother; Kidney disease in her brother; Pulmonary  embolism in her mother; Stroke in her brother and father.    ROS:  Please see the history of present illness.   Otherwise, review of systems are positive for none.   All other systems are reviewed and negative.    PHYSICAL EXAM: VS:  BP (!) 114/54   Pulse 71   Wt 152 lb 9.6 oz (69.2 kg)   BMI 27.03 kg/m  , BMI Body mass index is 27.03 kg/m. GENERAL:  Well appearing HEENT: Pupils equal round and reactive, fundi not visualized, oral mucosa unremarkable NECK:  No jugular venous distention, waveform within normal limits, carotid upstroke brisk and symmetric, no bruits LUNGS:  Clear to auscultation bilaterally HEART:  RRR.  PMI not displaced or sustained,S1 and S2 within normal limits, no S3, no S4, no clicks, no rubs, II/VI systolic murmur at the LUSB ABD:  Flat, positive bowel sounds normal in frequency in pitch, no bruits, no rebound, no guarding, no midline pulsatile mass, no hepatomegaly, no splenomegaly EXT:  2 plus pulses throughout, no edema, no cyanosis no clubbing SKIN:  No rashes no nodules NEURO:  Cranial nerves II through XII grossly intact, motor grossly intact throughout PSYCH:  Cognitively intact, oriented to person place and time   EKG:  EKG is not ordered today. The ekg ordered 10/08/16 demonstrates sinus rhythm rate 76 bpm.  PVCs.  Inferolateral TWI 07/30/17:  Sinus rhythm.  Rate 83 bpm.  First degree AV block.  PACs.  Non-specific T wave abnormalities.   03/09/2018: Sinus rhythm.  Rate 85 bpm.  First-degree AV block.  Low voltage precordial leads.  12/21/16: Study Conclusions  - Left ventricle: The cavity size was normal. Wall thickness was   increased in a pattern of mild LVH. Systolic function was   severely reduced. The estimated ejection fraction was in the   range of 25% to 30%. Diffuse hypokinesis. Doppler parameters are   consistent with restrictive physiology, indicative of decreased   left ventricular diastolic compliance and/or increased left   atrial pressure. Doppler parameters are consistent with high   ventricular filling pressure. - Aortic valve: Valve mobility was restricted. There was trivial   regurgitation. - Mitral valve: Calcified annulus. Mildly thickened leaflets .   There was mild regurgitation. - Left atrium: The atrium was mildly dilated. - Right ventricle: The cavity size was mildly dilated. Systolic   function was mildly reduced. - Right atrium: The atrium was mildly dilated. - Pulmonary arteries: Systolic pressure was moderately increased.   PA peak pressure: 53 mm Hg (S). - Pericardium, extracardiac: A trivial pericardial effusion was   identified.  Carotid Doppler 12/15/15 and 11/06/16:  R and L ICA 1-39%.  L ECA >50%  Lexiscan Myoview 10/16/16:  The left ventricular ejection fraction is moderately decreased (30-44%).  Nuclear stress EF: 30%.  There is a medium defect of moderate severity present in the basal inferior, mid inferior and apical inferior location. The defect is non-reversible and likely secondary to diaphragmatic attenuation artifact and extracardiac tracer uptake as there is no focal wall motion abnormality in this area. No ischemia noted.  This is an intermediate risk study due to LV dysfunction.  There was downsloping ST segment depression in the inferolateral leads at baseline with no  change during infusion.  7 Day Event Monitor 03/17/18: Quality: Fair.  Baseline artifact. Predominant rhythm: sinus rhythm Average heart rate: 82 bpm Max heart rate: 125 bpm Min heart rate: 57 bpm  Occasional PACs and PVCs 9 beats of NSVT Atrial fibrillation  ventricular rate 70 bpm.  Recent Labs: 03/17/2018: BUN 21; Creatinine, Ser 0.89; Hemoglobin 11.4; Magnesium 1.2; Platelets 249; Potassium 4.1; Sodium 140; TSH 3.950   09/07/16:  BNP 1500 Sodium 137, potassium 4.4, BUN 13, creatinine 0.78 AST 19, ALT 25 WBC 10.1, hemoglobin 13.1, hematocrit 39.7, platelets 240  05/12/18: Total cholesterol 168, triglycerides 244, HDL 33, LDL 86   Lipid Panel    Component Value Date/Time   CHOL 84 11/16/2016 0521   TRIG 91 11/16/2016 0521   HDL 18 (L) 11/16/2016 0521   CHOLHDL 4.7 11/16/2016 0521   VLDL 18 11/16/2016 0521   LDLCALC 48 11/16/2016 0521      Wt Readings from Last 3 Encounters:  09/06/18 152 lb 9.6 oz (69.2 kg)  06/16/18 154 lb (69.9 kg)  04/04/18 155 lb (70.3 kg)      ASSESSMENT AND PLAN:  # Chronic systolic and diastolic heart failure:  # Hypertension:  LVEF 25-30%.  No ischemia on Lexiscan Myoview.  She is doing very well on Entresto and is euvolemic.  Continue metoprolol and furosemide.   # PACs: # Atrial bigeminy: # PAF: Well controlled on metoprolol.  Continue Eliquis.   # Carotid stenosis: # Prior TIA:  Ms. Arriaga has moderate carotid stenosis.  Continue Eliquis and atorvastatin.   # Dizziness: Improved lately except since she has been experiencing and upper respiratory infection.  Continue mec  Current medicines are reviewed at length with the patient today.  The patient does not have concerns regarding medicines.  The following changes have been made:  Switch Eliquis to Xarelto.   Labs/ tests ordered today include:   No orders of the defined types were placed in this encounter.    Disposition:   FU with Dareon Nunziato C. Oval Linsey, MD, [facility] in 1  year.  APP in 6 months     Signed, Kongmeng Santoro C. Oval Linsey, MD, [facility]  09/06/2018 1:58 PM    [place]

## 2018-10-08 DIAGNOSIS — R079 Chest pain, unspecified: Secondary | ICD-10-CM | POA: Diagnosis not present

## 2018-10-08 DIAGNOSIS — R110 Nausea: Secondary | ICD-10-CM | POA: Diagnosis not present

## 2018-10-08 DIAGNOSIS — I10 Essential (primary) hypertension: Secondary | ICD-10-CM | POA: Diagnosis not present

## 2018-10-08 DIAGNOSIS — I440 Atrioventricular block, first degree: Secondary | ICD-10-CM | POA: Diagnosis not present

## 2018-10-14 DIAGNOSIS — E1122 Type 2 diabetes mellitus with diabetic chronic kidney disease: Secondary | ICD-10-CM | POA: Diagnosis not present

## 2018-10-14 DIAGNOSIS — R030 Elevated blood-pressure reading, without diagnosis of hypertension: Secondary | ICD-10-CM | POA: Diagnosis not present

## 2018-10-14 DIAGNOSIS — F4329 Adjustment disorder with other symptoms: Secondary | ICD-10-CM | POA: Diagnosis not present

## 2018-10-14 DIAGNOSIS — F419 Anxiety disorder, unspecified: Secondary | ICD-10-CM | POA: Diagnosis not present

## 2018-10-14 DIAGNOSIS — I639 Cerebral infarction, unspecified: Secondary | ICD-10-CM | POA: Diagnosis not present

## 2018-10-24 DIAGNOSIS — E782 Mixed hyperlipidemia: Secondary | ICD-10-CM | POA: Diagnosis not present

## 2018-10-24 DIAGNOSIS — F4329 Adjustment disorder with other symptoms: Secondary | ICD-10-CM | POA: Diagnosis not present

## 2018-10-24 DIAGNOSIS — E1122 Type 2 diabetes mellitus with diabetic chronic kidney disease: Secondary | ICD-10-CM | POA: Diagnosis not present

## 2018-10-24 DIAGNOSIS — M65322 Trigger finger, left index finger: Secondary | ICD-10-CM | POA: Diagnosis not present

## 2018-10-24 DIAGNOSIS — I10 Essential (primary) hypertension: Secondary | ICD-10-CM | POA: Diagnosis not present

## 2018-11-24 ENCOUNTER — Other Ambulatory Visit: Payer: Self-pay | Admitting: Cardiovascular Disease

## 2018-11-24 NOTE — Telephone Encounter (Signed)
Eliquis refilled.  

## 2019-02-02 ENCOUNTER — Telehealth: Payer: Self-pay | Admitting: Cardiovascular Disease

## 2019-02-02 DIAGNOSIS — I5042 Chronic combined systolic (congestive) and diastolic (congestive) heart failure: Secondary | ICD-10-CM

## 2019-02-02 MED ORDER — ENTRESTO 49-51 MG PO TABS: 1.0000 | ORAL_TABLET | Freq: Two times a day (BID) | ORAL | 2 refills | Status: DC

## 2019-02-02 MED ORDER — FUROSEMIDE 20 MG PO TABS: 20.0000 mg | ORAL_TABLET | Freq: Every day | ORAL | 1 refills | Status: DC

## 2019-02-02 NOTE — Telephone Encounter (Signed)
In office visit for HTN clinic or virtual?  Thanks!

## 2019-02-02 NOTE — Telephone Encounter (Signed)
Recommendation:  1. Increase Entresto to 49/51mg  twice daily (samples available if needed) 2. Decrease furosemide to 20mg  daily 3. Continue metoprolol 25mg  without changes 4. Repeat BMEt in 2 weeks 5. Schedule f/u appointment with HTN clinic for July if agreeable. Call back if assistance needed prior to appointment day.

## 2019-02-02 NOTE — Addendum Note (Signed)
Addended by: Caprice Beaver T on: 02/02/2019 03:29 PM   Modules accepted: Orders

## 2019-02-02 NOTE — Telephone Encounter (Signed)
In office. We have opening stating July 2nd

## 2019-02-02 NOTE — Telephone Encounter (Signed)
  Pt c/o BP issue: STAT if pt c/o blurred vision, one-sided weakness or slurred speech  1. What are your last 5 BP readings?   5/6   147/68 5/7   153/76 5/8   131/63 5/9   167/76 5/10 144/60  2. Are you having any other symptoms (ex. Dizziness, headache, blurred vision, passed out)? Dizziness but she says she has that anyway  3. What is your BP issue? Patient states her BP has been a little high and would like to speak to the nurse

## 2019-02-02 NOTE — Telephone Encounter (Signed)
Please see previous note, accidentally hit sign encounter

## 2019-02-02 NOTE — Telephone Encounter (Signed)
Called patient, advised of message from PharmD.  Patient lives at [facility] in [place], [place] and states that a Pharmacist comes there and she will speak with them, if unable she will call back to make appointment with our HTN clinic.   RX sent to mail order- Patient will continue her Entresto dose until she is able to get new increased dose.  BMET ordered- and notified needed in 2 weeks, she will have to have assistance to bring her.

## 2019-02-02 NOTE — Telephone Encounter (Signed)
Patient states that her BP has been increasing, it becomes worse in the evening, patient currently is taking 1 tablet in the morning and 1 tablet in the evening of Metoprolol.  HR per patient has been okay 64-78 range.  Highest in the evening on 29th 179/72 HR 72. At 10:00 PM Lowest 127/75 HR 73 at noon on the June 1st. Last night BP 144/60 HR 65 at 8:45 PM   Patient states she has had no changes in diet, chest pain, SOB, or swelling. She does mention she is under a lot of stress at this time, and she states she is having issues with an anxiety attack last one was 1 month ago.

## 2019-02-14 ENCOUNTER — Telehealth: Payer: Self-pay | Admitting: Cardiovascular Disease

## 2019-02-14 ENCOUNTER — Telehealth: Payer: Self-pay | Admitting: Internal Medicine

## 2019-02-14 NOTE — Telephone Encounter (Signed)
Mychart, no smartphone (will use IPAD), consent, pre reg complete 02/14/19 AF

## 2019-02-14 NOTE — Telephone Encounter (Signed)
Called patient, she states that she has been having issues with her BP recently. Last night it was 180/? She is unsure of the bottom numbers- or HR at this time. She states she "could feel it going up", she took a baby aspirin last night and brought it down to 167/? And she was sitting down, resting.   She denies dizziness, lightheadedness, but does state she feels "pins" in her head, she also was sweating a lot last night and even got the BP before bed to 147/?. Patient denies CP, increase in salt, swelling or SOB.  She does mention a lot of stress in her life right now- she has 8 children and one has died, and one has cancer, and another is having other issues. I advised her to see her PCP again regarding her anxiety- she states she does have xanax and buspar to take as needed, and she does that but feels nothing is getting better.  Advised patient to not check her BP as often as it can make it go up when she sees that it is high- patient advised to only check about twice a day at different times, and to make appointment to see PCP regarding her stress/anxiety issues as they may need to make changes to those other medications as well.   Advised I would route to MD covering for Dr.Perkins and her nurse as well for recommendations.

## 2019-02-14 NOTE — Telephone Encounter (Signed)
Please offer the patient a virtual or in office follow up with me if she would like to discuss issues with BP. Agree with recommendations from J. Thompson LPN provided to the patient.

## 2019-02-14 NOTE — Telephone Encounter (Signed)
Patient called back returning a call from our office °

## 2019-02-14 NOTE — Telephone Encounter (Signed)
Spoke to patient - appointment schedule for 6/26 at 2:20 pm   Aware to keep log blood pressure reading  instruction given for  Virtual visit.      TELEPHONE CALL NOTE  This patient has been deemed a candidate for follow-up tele-health visit to limit community exposure during the Covid-19 pandemic. I spoke with the patient via phone to discuss instructions.  The patient will receive a phone call 2-3 days prior to their E-Visit at which time consent will be verbally confirmed.   A Virtual Office Visit appointment type has been scheduled for 6/26  with [facility], with "VIDEO/EMAIL    I have  confirmed the patient is active in [place], Christo Hain V, [place] 02/14/2019 11:16 AM

## 2019-02-14 NOTE — Telephone Encounter (Signed)
Attempted to contact patient, rang x8 and did not answer, no voicemail. Will route to scheduling to get set up for appointment to be made with Dr.Acharya

## 2019-02-14 NOTE — Telephone Encounter (Signed)
Spoke to patient, have not called since earlier.   Registration call to pre -reg- patient aware  await their return call

## 2019-02-14 NOTE — Telephone Encounter (Signed)
New Message   Pt c/o BP issue:  1. What are your last 5 BP readings? 190/95, 147/72,  2. Are you having any other symptoms (ex. Dizziness, headache, blurred vision, passed out)? She said it felt like pins and needles in her head. But at the time of this conversation she has no symptoms she just real nervous  3. What is your medication issue? Patient states that she took a baby aspirin last night and it went down but went back up this morning to 160/? But she does not remember the bottom

## 2019-02-16 NOTE — Telephone Encounter (Signed)
I called pt to confirm appt for 02-17-19 with Dr Margaretann Loveless.

## 2019-02-17 ENCOUNTER — Telehealth (INDEPENDENT_AMBULATORY_CARE_PROVIDER_SITE_OTHER): Payer: Medicare Other | Admitting: Internal Medicine

## 2019-02-17 ENCOUNTER — Encounter: Payer: Self-pay | Admitting: Internal Medicine

## 2019-02-17 VITALS — BP 154/70 | HR 68 | Ht 63.0 in | Wt 148.0 lb

## 2019-02-17 DIAGNOSIS — I10 Essential (primary) hypertension: Secondary | ICD-10-CM

## 2019-02-17 DIAGNOSIS — R002 Palpitations: Secondary | ICD-10-CM | POA: Diagnosis not present

## 2019-02-17 DIAGNOSIS — I5042 Chronic combined systolic (congestive) and diastolic (congestive) heart failure: Secondary | ICD-10-CM

## 2019-02-17 NOTE — Patient Instructions (Addendum)
Medication Instructions:   If you need a refill on your cardiac medications before your next appointment, please call your pharmacy.   Lab work:  NOT NEEDED  Testing/Procedures: NOT NEEDED   Follow-Up: At [facility], you and your health needs are our priority.  As part of our continuing mission to provide you with exceptional heart care, we have created designated Provider Care Teams.  These Care Teams include your primary Cardiologist (physician) and Advanced Practice Providers (APPs -  Physician Assistants and Nurse Practitioners) who all work together to provide you with the care you need, when you need it.  You will need a follow up appointment in  2 weeks  With [facility] - VIRTUAL VISIT FOR BLOOD PRESSURE.[name]  OFFICE WILL CALL YOU WITH THE APPOINTMENT  Any Other Special Instructions Will Be Listed Below.

## 2019-02-17 NOTE — Progress Notes (Signed)
Virtual Visit via Telephone Note   This visit type was conducted due to national recommendations for restrictions regarding the COVID-19 Pandemic (e.g. social distancing) in an effort to limit this patient's exposure and mitigate transmission in our community.  Due to her co-morbid illnesses, this patient is at least at moderate risk for complications without adequate follow up.  This format is felt to be most appropriate for this patient at this time.  The patient did not have access to video technology/had technical difficulties with video requiring transitioning to audio format only (telephone).  All issues noted in this document were discussed and addressed.  No physical exam could be performed with this format.  Please refer to the patient's chart for her  consent to telehealth for [facility].   Date:  02/17/2019   ID:  Helen [name], DOB [birth year], MRN [id number]  Patient Location: Home Provider Location: Office  PCP:  Helen Shanks, MD  Cardiologist:  Helen Latch, MD  Electrophysiologist:  None   Evaluation Performed:  Follow-Up Visit  Chief Complaint:  HTN  History of Present Illness:    Helen [name] is a 85 y.o. female with chronic systolic and diastolic heart failure, paroxysmal atrial fibrillation, hypertension, hyperlipidemia, diabetes, prior stroke, CAD, carotid stenosis and breast cancer s/p chemotherapy and double mastectomy who presents for follow up. She called in with concerns about her blood pressure. She feels a tingling in her head.  She has been in contact with our pharmacists regarding her blood pressure, please see Telephone encounters from June. She endorsed anxiety as a contributor. Recommendations outlined by our pharmacist as follows: "1. Increase Entresto to 49/51mg  twice daily (samples available if needed) 2. Decrease furosemide to 20mg  daily 3. Continue metoprolol 25mg  without changes 4. Repeat BMEt in 2 weeks 5. Schedule f/u appointment  with HTN clinic for July if agreeable. Call back if assistance needed prior to appointment day."  She has been reviewing her medications with her pharmacist at [facility]. She has also been prescribed Xanax. Xanax helps with tingling sensation in her head. She feels she gets this sensation when her BP is elevated. She also finds that a baby aspirin will help this symptom. We discussed that aspirin itself is unlikely to have a potent BP effect but may easy her discomfort which could then in turn decrease BP.   She is without significant other symptoms, and has no chest pain, SOB or DOE.   The patient does not have symptoms concerning for COVID-19 infection (fever, chills, cough, or new shortness of breath).    Past Medical History:  Diagnosis Date  . Anemia   . Cancer [facility])    Breast cancer  . Chronic combined systolic (congestive) and diastolic (congestive) heart failure (HCC)    a. EF 25-30% by echo in 11/2016. Recent NST showing no inducible ischemia --> therefore thought to be most consistent with nonischemic cardiomyopathy.   . Diabetes mellitus   . History of stroke 11/2016   incidental finding on MRI  . Hypercholesteremia   . Hypertension   . Peripheral vascular disease ([place]) 2015   bilateral moderate CA disease-Dr Helen [name]   Past Surgical History:  Procedure Laterality Date  . ABDOMINAL HYSTERECTOMY    . APPENDECTOMY  1951  . [place]  . CHOLECYSTECTOMY  2003   By Dr. Excell [name]  . EYE SURGERY Bilateral 2009   Cataract surgery  . FRACTURE SURGERY Right    ORIF   by Dr.  Weingold  . HUMERUS FRACTURE SURGERY    . MASTECTOMY Bilateral   . NASAL SEPTUM SURGERY  1977   Dr. Ernesto [name]  . TOENAIL EXCISION       Current Meds  Medication Sig  . ALPRAZolam (XANAX XR) 0.5 MG 24 hr tablet Take 0.5 mg by mouth daily.  [name] aspirin EC 81 MG tablet Take 81 mg by mouth daily.  [name] atorvastatin (LIPITOR) 80 MG tablet Take 80 mg by mouth at bedtime.  . Biotin 100  MG/GM POWD Take 100 mg by mouth daily.  . cholecalciferol (VITAMIN D) 1000 units tablet Take 1,000 Units by mouth every evening.   [name] ELIQUIS 5 MG TABS tablet TAKE 1 TABLET BY MOUTH TWO  TIMES DAILY  . fluticasone (FLONASE) 50 MCG/ACT nasal spray Place 1 spray into both nostrils daily as needed for allergies or rhinitis.   . furosemide (LASIX) 20 MG tablet Take 1 tablet (20 mg total) by mouth daily. (Patient taking differently: Take 20 mg by mouth daily. Patient currently taking 1/2 of a 40mg  tablet ie 20mg  daily)  . magnesium oxide (MAG-OX) 400 MG tablet 1 tablet by mouth twice a day for 1 week and then 1 daily (Patient taking differently: daily. )  . meclizine (ANTIVERT) 25 MG tablet TAKE ONE TABLET BY MOUTH THREE TIMES A DAY AS NEEDED FOR DIZZINESS  . metoprolol succinate (TOPROL-XL) 25 MG 24 hr tablet TAKE 1 TABLET BY MOUTH IN THE MORNING AND (1/2) HALF TABLET IN THE EVENING DAILY (Patient taking differently: TAKE 1 TABLET BY MOUTH IN THE MORNING AND 1 TABLET IN THE EVENING DAILY)  . Multiple Vitamin (MULTIVITAMIN WITH MINERALS) TABS tablet Take 1 tablet by mouth daily.   . pantoprazole (PROTONIX) 40 MG tablet   . sacubitril-valsartan (ENTRESTO) 49-51 MG Take 1 tablet by mouth 2 (two) times daily.  . vitamin C (ASCORBIC ACID) 500 MG tablet Take 500 mg by mouth daily.      Allergies:   Morphine and related, Other, Sulfa antibiotics, Sulfasalazine, and Nickel   Social History   Tobacco Use  . Smoking status: Former Smoker    Quit date: 08/24/1978    Years since quitting: 40.5  . Smokeless tobacco: Never Used  Substance Use Topics  . Alcohol use: No  . Drug use: No     Family Hx: The patient's family history includes Cancer in her mother; Diabetes in her brother; Heart attack in her father; Heart disease in her father; Hyperlipidemia in her brother; Kidney disease in her brother; Pulmonary embolism in her mother; Stroke in her brother and father.  ROS:   Please see the history of  present illness.     All other systems reviewed and are negative.   Prior CV studies:   The following studies were reviewed today:    Labs/Other Tests and Data Reviewed:    EKG:  No ECG reviewed.  Recent Labs: 03/17/2018: Hemoglobin 11.4; Magnesium 1.2; Platelets 249; TSH 3.950 03/02/2019: BUN 18; Creatinine, Ser 1.10; Potassium 4.0; Sodium 141   Recent Lipid Panel Lab Results  Component Value Date/Time   CHOL 84 11/16/2016 05:21 AM   TRIG 91 11/16/2016 05:21 AM   HDL 18 (L) 11/16/2016 05:21 AM   CHOLHDL 4.7 11/16/2016 05:21 AM   LDLCALC 48 11/16/2016 05:21 AM    Wt Readings from Last 3 Encounters:  02/17/19 148 lb (67.1 kg)  09/06/18 152 lb 9.6 oz (69.2 kg)  06/16/18 154 lb (69.9 kg)     Objective:  Vital Signs:  BP (!) 154/70   Pulse 68   Ht 5\' 3"  (1.6 m)   Wt 148 lb (67.1 kg)   BMI 26.22 kg/m    VITAL SIGNS:  reviewed GEN:  no acute distress  ASSESSMENT & PLAN:    1. Essential hypertension   2. Palpitations   3. Chronic combined systolic (congestive) and diastolic (congestive) heart failure (HCC)    HTN - her medication regimen is adequate and elevations in BP may be due to anxiety. I have encouraged her to take BP once or twice daily only, or if symptomatic can take BP. Checking many times a day in a serial fashion is increasing anxiety and raising BP. We will have her visit with the pharmacists again in 2 weeks to ensure her blood pressures are not significantly elevated. After discussing together, she would like to continue to address anxiety and determine if that is the primary contributor to symptoms.   Chronic combined systolic and diastolic CHF - BMET recommended after increase in Entresto.  COVID-19 Education: The signs and symptoms of COVID-19 were discussed with the patient and how to seek care for testing (follow up with PCP or arrange E-visit).  The importance of social distancing was discussed today.  Time:   Today, I have spent 25 minutes  with the patient with telehealth technology discussing the above problems.     Medication Adjustments/Labs and Tests Ordered: Current medicines are reviewed at length with the patient today.  Concerns regarding medicines are outlined above.   Tests Ordered: No orders of the defined types were placed in this encounter.   Medication Changes: No orders of the defined types were placed in this encounter.   Follow Up:  Virtual Visit in 2 week(s)  Signed, Elouise Munroe, MD  02/17/2019 5:45 PM    [place]

## 2019-02-20 ENCOUNTER — Other Ambulatory Visit: Payer: Self-pay

## 2019-02-20 DIAGNOSIS — I5042 Chronic combined systolic (congestive) and diastolic (congestive) heart failure: Secondary | ICD-10-CM | POA: Diagnosis not present

## 2019-02-21 LAB — BASIC METABOLIC PANEL
BUN/Creatinine Ratio: 16 (ref 12–28)
BUN: 19 mg/dL (ref 8–27)
CO2: 29 mmol/L (ref 20–29)
Calcium: 9.5 mg/dL (ref 8.7–10.3)
Chloride: 96 mmol/L (ref 96–106)
Creatinine, Ser: 1.2 mg/dL — ABNORMAL HIGH (ref 0.57–1.00)
GFR calc Af Amer: 47 mL/min/{1.73_m2} — ABNORMAL LOW (ref >59)
GFR calc non Af Amer: 41 mL/min/{1.73_m2} — ABNORMAL LOW (ref >59)
Glucose: 128 mg/dL — ABNORMAL HIGH (ref 65–99)
Potassium: 4.7 mmol/L (ref 3.5–5.2)
Sodium: 138 mmol/L (ref 134–144)

## 2019-02-27 ENCOUNTER — Telehealth: Payer: Self-pay | Admitting: Cardiovascular Disease

## 2019-02-27 DIAGNOSIS — I10 Essential (primary) hypertension: Secondary | ICD-10-CM

## 2019-02-27 NOTE — Telephone Encounter (Signed)
Erin returned call. She will discuss with her other sister about bringing patient in for visit and will call me back.

## 2019-02-27 NOTE — Telephone Encounter (Signed)
Attempted to call Helen [name], no voicemail set up, could not leave message. Called Erin and left message to return call. Patient is also in need of a repeat BMP. Will see if they would like to change visit to in person for lab work.  Daughters may not be able to accompany, depending on how much assistance patient needs. But will be able to call during visit. This visit would need to be at [address] office. If patient wishes to get labs else where and keep virtual visit then we can do a 3 way call. Will see if daughters can be in same location.

## 2019-02-27 NOTE — Telephone Encounter (Signed)
Pt daughter [name]) called back. Visit changed to in person visit. She will need to be accompained by Dorian Pod (other daughter). Will call Erin during visit to participate in visit. Will get repeat BMP at visit

## 2019-02-27 NOTE — Telephone Encounter (Signed)
Daughter of Patient Helen [name] wanted to know if there was a way that either her or the patient's other daughter Helen [name] could be included on the virtual chat on Thursday.  The patient has memory issues, so they would like to know what the provider has to say.  The Daughters are unable to go to the [place] where the patient lives, so they were hoping that the link could be sent to multiple cell phones.   Please discuss details with either daughter. They are both on the patient's DPR.

## 2019-03-02 ENCOUNTER — Other Ambulatory Visit: Payer: Self-pay

## 2019-03-02 ENCOUNTER — Other Ambulatory Visit: Payer: Self-pay | Admitting: Cardiovascular Disease

## 2019-03-02 ENCOUNTER — Ambulatory Visit (INDEPENDENT_AMBULATORY_CARE_PROVIDER_SITE_OTHER): Payer: Medicare Other | Admitting: Pharmacist

## 2019-03-02 ENCOUNTER — Telehealth: Payer: Medicare Other

## 2019-03-02 VITALS — BP 120/80 | HR 77

## 2019-03-02 DIAGNOSIS — I2510 Atherosclerotic heart disease of native coronary artery without angina pectoris: Secondary | ICD-10-CM

## 2019-03-02 DIAGNOSIS — I10 Essential (primary) hypertension: Secondary | ICD-10-CM | POA: Diagnosis not present

## 2019-03-02 DIAGNOSIS — I5042 Chronic combined systolic (congestive) and diastolic (congestive) heart failure: Secondary | ICD-10-CM

## 2019-03-02 NOTE — Progress Notes (Signed)
Patient ID: Dianca Owensby                 DOB: [birth year]                      MRN: [id number]     HPI: Alishba Naples is a 85 y.o. female referred by Dr. Oval Linsey to HTN clinic. PMH is significant for chronic systolic and diastolic heart failure, paroxysmal atrial fibrillation, hypertension, hyperlipidemia, diabetes, prior stroke, CAD, carotid stenosis and breast cancer s/p chemotherapy and double mastectomy. Appears patient has been struggle with anxiety lately. On 02/02/2019 patient called clinic stating her blood pressure had been running high. Her Entresto was increased to 49/51mg  twice a day, furosemide decreased to 20mg  daily and metoprolol was continued at 12.5mg  BID. Patient presents with her daughter Dorian Pod in person to clinic today for follow up. Her daughter [name] was also present via phone for visit. Her last BMP showed slight elevated in scr from 0.89 to 1.20 with Entresto increase. We will recheck today to make sure scr remains stable. Patient states she is taking metoprolol 25mg  twice a day. States was increased at previous phone visit.  Per Daughter patient is deconditioned due to quarientine, but is now allowed to walk the halls of her building.  She was started on Buspar and Xanex for anxiety, but is being weened off Xanex. At first was not following weening (cut dose too fast) direction or buspar directions (wasn't taking daily) but is now following direction properly.  She reports headache off and on over the last few months. May be related to improper use of anxiety medications. She states she is always dizzy- worse when she walks, but not worse since increasing BP meds. Denies swelling.   Patient has a wrist cuff at home. Was compared to a clinic cuff about a year ago and was accurate. Patient was taking her BP at home multiple times a day which was causing a lot of anxiety and increasing her blood pressure. She now is only taking once.  First blood pressure reading in clinic  was elevated, but upon repeat blood pressure had gone down.  Current HTN meds: metoprolol succinate 25mg  BID, Entresto 49/51mg  BID, furosemide 20mg  daily Previously tried:  BP goal: <130/80  Family History: The patient's family history includes Cancer in her mother; Diabetes in her brother; Heart attack in her father; Heart disease in her father; Hyperlipidemia in her brother; Kidney disease in her brother; Pulmonary embolism in her mother; Stroke in her brother and father.  Social History:  The patient  reports that she quit smoking about 40 years ago. She has never used smokeless tobacco. She reports that she does not drink alcohol or use drugs.   Diet: 2-3 cups of tea, does not use salt. Pays attention to Na intake.   Exercise: working on increasing walking  Home BP readings: 98/54, 127/77, 124/54, 106/47, 170/87, 133/58, 103/50, 104/47, 113/51, 122/52, 144/62 HR 60's-70's  Wt Readings from Last 3 Encounters:  02/17/19 148 lb (67.1 kg)  09/06/18 152 lb 9.6 oz (69.2 kg)  06/16/18 154 lb (69.9 kg)   BP Readings from Last 3 Encounters:  02/17/19 (!) 154/70  09/06/18 (!) 114/54  06/16/18 (!) 142/62   Pulse Readings from Last 3 Encounters:  02/17/19 68  09/06/18 71  06/16/18 76    Renal function: Estimated Creatinine Clearance: 30.4 mL/min (A) (by C-G formula based on SCr of 1.2 mg/dL (H)).  Past Medical History:  Diagnosis Date  . Anemia   . Cancer [facility])    Breast cancer  . Chronic combined systolic (congestive) and diastolic (congestive) heart failure (HCC)    a. EF 25-30% by echo in 11/2016. Recent NST showing no inducible ischemia --> therefore thought to be most consistent with nonischemic cardiomyopathy.   . Diabetes mellitus   . History of stroke 11/2016   incidental finding on MRI  . Hypercholesteremia   . Hypertension   . Peripheral vascular disease ([place]) 2015   bilateral moderate CA disease-Dr Bridgett Larsson    Current Outpatient Medications on File Prior to Visit   Medication Sig Dispense Refill  . ALPRAZolam (XANAX XR) 0.5 MG 24 hr tablet Take 0.5 mg by mouth daily.    [name] aspirin EC 81 MG tablet Take 81 mg by mouth daily.    [name] atorvastatin (LIPITOR) 80 MG tablet Take 80 mg by mouth at bedtime.    . Biotin 100 MG/GM POWD Take 100 mg by mouth daily.    . cholecalciferol (VITAMIN D) 1000 units tablet Take 1,000 Units by mouth every evening.     [name] ELIQUIS 5 MG TABS tablet TAKE 1 TABLET BY MOUTH TWO  TIMES DAILY 180 tablet 1  . fluticasone (FLONASE) 50 MCG/ACT nasal spray Place 1 spray into both nostrils daily as needed for allergies or rhinitis.     . furosemide (LASIX) 20 MG tablet Take 1 tablet (20 mg total) by mouth daily. (Patient taking differently: Take 20 mg by mouth daily. Take half a tablet daily.) 90 tablet 1  . magnesium oxide (MAG-OX) 400 MG tablet 1 tablet by mouth twice a day for 1 week and then 1 daily (Patient taking differently: daily. ) 40 tablet 3  . meclizine (ANTIVERT) 25 MG tablet TAKE ONE TABLET BY MOUTH THREE TIMES A DAY AS NEEDED FOR DIZZINESS 90 tablet 2  . metoprolol succinate (TOPROL-XL) 25 MG 24 hr tablet TAKE 1 TABLET BY MOUTH IN THE MORNING AND (1/2) HALF TABLET IN THE EVENING DAILY 135 tablet 3  . Multiple Vitamin (MULTIVITAMIN WITH MINERALS) TABS tablet Take 1 tablet by mouth daily.     . pantoprazole (PROTONIX) 40 MG tablet     . sacubitril-valsartan (ENTRESTO) 49-51 MG Take 1 tablet by mouth 2 (two) times daily. 60 tablet 2  . vitamin C (ASCORBIC ACID) 500 MG tablet Take 500 mg by mouth daily.      No current facility-administered medications on file prior to visit.     Allergies  Allergen Reactions  . Morphine And Related Nausea And Vomiting  . Other Other (See Comments)    Other reaction(s): Other (See Comments) Barley & Carrots: learned through allergy testing. Unknown reaction-- MD said not very allergic but not to eat large quantities Barley & Carrots: learned through allergy testing. Unknown reaction-- MD said not  very allergic but not to eat large quantities  . Sulfa Antibiotics Hives  . Sulfasalazine Hives  . Nickel Rash    There were no vitals taken for this visit.   Assessment/Plan:  1. Hypertension - Blood pressure at goal of >130/80 in clinic. Home readings show an average of 112/58. With only a few outliers much above goal. Continue Entresto 51/49mg  BID, metoprolol 25mg  BID and furosemide 20mg  daily. Will follow up on BMP results. Patient to follow up as needed. Patient educated about keeping fluid intake to 2L/ day, weighting herself daily, and increasing activity as tolerated- starting with small goals.    Thank you  Sumedh Shinsato  D Justinian Miano, Pharm.D, [place]  [address], [place], [place] [address]  Phone: [phone]; Fax: [phone]

## 2019-03-02 NOTE — Patient Instructions (Addendum)
Continue taking metoprolol succinate 25mg  twice a day, Entresto 49/51mg  twice a day and furosemide 10mg  daily. Continue checking blood pressure just once a day Start to set walking goals. For example make it a goal to walk to the laundry room and back once every day. Start small and increase as tolerated Keep cafeinated tea intake to 2 cups a day Keep total fluid intake to 2L per day Call MD with weight gain for 3 or more pounds overnight or 5lb in one week Follow up with Dr. Oval Linsey as directed Call us at [phone] with any questions or concerns

## 2019-03-03 ENCOUNTER — Telehealth: Payer: Self-pay | Admitting: Pharmacist

## 2019-03-03 LAB — BASIC METABOLIC PANEL
BUN/Creatinine Ratio: 16 (ref 12–28)
BUN: 18 mg/dL (ref 8–27)
CO2: 25 mmol/L (ref 20–29)
Calcium: 9.5 mg/dL (ref 8.7–10.3)
Chloride: 98 mmol/L (ref 96–106)
Creatinine, Ser: 1.1 mg/dL — ABNORMAL HIGH (ref 0.57–1.00)
GFR calc Af Amer: 52 mL/min/{1.73_m2} — ABNORMAL LOW (ref >59)
GFR calc non Af Amer: 45 mL/min/{1.73_m2} — ABNORMAL LOW (ref >59)
Glucose: 133 mg/dL — ABNORMAL HIGH (ref 65–99)
Potassium: 4 mmol/L (ref 3.5–5.2)
Sodium: 141 mmol/L (ref 134–144)

## 2019-03-03 NOTE — Telephone Encounter (Signed)
Attempted to call patient, line busy Called daughter erin-left voice mail Scr has improved from 1.2 to 1.10 Continue medications as is.  There was an error on patients AVS- states continue furosemide 10mg - patient is on 1/2 of a 40mg  tablet, not a 20mg  tablet. Therefore directions should have said continue furosemide 20mg  daily.

## 2019-03-03 NOTE — Telephone Encounter (Signed)
Confirmed with patient. She is currently taking 1/2 of a 40mg  furosemide tablet.

## 2019-03-03 NOTE — Telephone Encounter (Signed)
Patient's daughter returned call, below info provided.

## 2019-03-29 ENCOUNTER — Other Ambulatory Visit: Payer: Self-pay | Admitting: Cardiovascular Disease

## 2019-05-12 DIAGNOSIS — E782 Mixed hyperlipidemia: Secondary | ICD-10-CM | POA: Diagnosis not present

## 2019-05-12 DIAGNOSIS — I639 Cerebral infarction, unspecified: Secondary | ICD-10-CM | POA: Diagnosis not present

## 2019-05-12 DIAGNOSIS — E119 Type 2 diabetes mellitus without complications: Secondary | ICD-10-CM | POA: Diagnosis not present

## 2019-05-12 DIAGNOSIS — I10 Essential (primary) hypertension: Secondary | ICD-10-CM | POA: Diagnosis not present

## 2019-05-12 DIAGNOSIS — I6529 Occlusion and stenosis of unspecified carotid artery: Secondary | ICD-10-CM | POA: Diagnosis not present

## 2019-05-12 DIAGNOSIS — F411 Generalized anxiety disorder: Secondary | ICD-10-CM | POA: Diagnosis not present

## 2019-05-12 DIAGNOSIS — I739 Peripheral vascular disease, unspecified: Secondary | ICD-10-CM | POA: Diagnosis not present

## 2019-05-12 DIAGNOSIS — I5042 Chronic combined systolic (congestive) and diastolic (congestive) heart failure: Secondary | ICD-10-CM | POA: Diagnosis not present

## 2019-06-02 DIAGNOSIS — Z23 Encounter for immunization: Secondary | ICD-10-CM | POA: Diagnosis not present

## 2019-06-08 ENCOUNTER — Telehealth: Payer: Self-pay | Admitting: Cardiovascular Disease

## 2019-06-08 MED ORDER — CARVEDILOL 12.5 MG PO TABS: 12.5000 mg | ORAL_TABLET | Freq: Two times a day (BID) | ORAL | 3 refills | Status: DC

## 2019-06-08 NOTE — Telephone Encounter (Signed)
Advised patient, verbalized understanding. Rx sent to [facility] and follow up scheduled

## 2019-06-08 NOTE — Telephone Encounter (Signed)
Let's try switching metoprolol to carvedilol 12.5mg  bid.  F/u with PharmD in 1 month.

## 2019-06-08 NOTE — Telephone Encounter (Signed)
Pt c/o BP issue: STAT if pt c/o blurred vision, one-sided weakness or slurred speech  1. What are your last 5 BP readings? 10/9 168/77      10/10 138/69      10/11 150/72      10/13 159/72      10/14 145/67      10/15 164/75  2. Are you having any other symptoms (ex. Dizziness, headache, blurred vision, passed out)? No symptoms  3. What is your BP issue? States her BP high.  Patient lives in a senior facility. Her PCP advised her to call us.

## 2019-06-08 NOTE — Telephone Encounter (Addendum)
Spoke with patient and advised will forward to Dr Oval Linsey for review Heart rate ranging from 64-73

## 2019-06-13 ENCOUNTER — Telehealth: Payer: Self-pay | Admitting: Cardiovascular Disease

## 2019-06-13 NOTE — Telephone Encounter (Signed)
New message   Patient has questions about the carvedilol (COREG) 12.5 MG tablet side effects and how to take the medication. Please call.

## 2019-06-13 NOTE — Telephone Encounter (Signed)
Spoke with patient and reviewed directions for Carvedilol.

## 2019-06-18 ENCOUNTER — Other Ambulatory Visit: Payer: Self-pay | Admitting: Cardiovascular Disease

## 2019-06-27 DIAGNOSIS — Z1159 Encounter for screening for other viral diseases: Secondary | ICD-10-CM | POA: Diagnosis not present

## 2019-06-28 ENCOUNTER — Telehealth: Payer: Self-pay | Admitting: Cardiovascular Disease

## 2019-06-28 NOTE — Telephone Encounter (Signed)
Mailed pt current med list to address on file. Pt aware that this has been mailed to her:  Medication List As of 06/28/2019 5:30 PM   ALPRAZolam 0.5 mg Oral Daily   Apixaban 5 MG TAKE 1 TABLET BY MOUTH TWO TIMES DAILY   Ascorbic Acid 500 mg Oral Daily   Aspirin 81 mg Oral Daily   Atorvastatin Calcium 80 mg Oral Daily at bedtime   Biotin 100 mg Oral Daily   Carvedilol 12.5 mg Oral 2 times daily   Cholecalciferol 1,000 Units Oral Every evening   Fluticasone Propionate 50 MCG/ACT 1 spray Each Nare Daily PRN   Furosemide 20 MG TAKE 1 TABLET BY MOUTH DAILY   Magnesium Oxide 400 MG 1 tablet by mouth twice a day for 1 week and then 1 daily  Patient taking differently:  daily.    Meclizine HCl 25 MG TAKE ONE TABLET BY MOUTH THREE TIMES A DAY AS NEEDED FOR DIZZINESS   Multiple Vitamin,Multiple Vitamins-Minerals 1 tablet Oral Daily   Pantoprazole Sodium 40 MG No dose, route, or frequency recorded.   Sacubitril-Valsartan 49-51 MG TAKE 1 TABLET BY MOUTH TWICE DAILY

## 2019-06-28 NOTE — Telephone Encounter (Signed)
Pt c/o BP issue: STAT if pt c/o blurred vision, one-sided weakness or slurred speech  1. What are your last 5 BP readings? 11/3 - 169/72      11/2 -  170/68       10/30  137/66      10/29 146/65  2. Are you having any other symptoms (ex. Dizziness, headache, blurred vision, passed out)? No symptoms she states one night it went up to 172/65.   3. What is your BP issue? Patient states she was recently changed in new BP medication it was working but know her BP is going back up.

## 2019-06-28 NOTE — Telephone Encounter (Signed)
Spoke with pt who stated that BPs climbing although she has been started on new BP med carvedilol to manage. She states when she started the medication her systolic readings ranged from 124-125 but now elevated. Confirmed date and time of reading with pt: 11/4 - 169/73  HR 66 while on phone with pt 11/3 - 169/72 7:40 am 11/2 - 170/68  8:45 pm 10/30 - 137/66 12:50 pm 10/29 - 146/65 4:25 pm  Reviewed all cardiac meds with pt. Per chart, pt should be taking carvedilol 12.5 mg BID but stated that she was taking daily. Clarified with pt who then confirmed she is taking as prescribed. Per chart, pt should be taking furosemide 20 mg daily.  "Ramond Dial, [facility]   03/03/19 11:58 AM Note   Attempted to call patient, line busy Called daughter erin-left voice mail Scr has improved from 1.2 to 1.10 Continue medications as is.  There was an error on patients AVS- states continue furosemide 10mg - patient is on 1/2 of a 40mg  tablet, not a 20mg  tablet. Therefore directions should have said continue furosemide 20mg  daily.     Pt states she has been taking half of 20 mg tablet daily. Pt should be taking 20 mg daily. Pt confirmed she is taking Entresto 49/51 mg BID. Reconfirmed current correct med list with pt. Pt will take all meds as directed, keep BP log, and keep appt with pharmd in HTN clinic 11/19. Advised pt to check BP BID and call back in a few days to report. Informed pt that on-call providers available after hours as well if she has an issues with BP in the evening. Also informed pt that message will be routed for any additional recommendations. Pt agreeable

## 2019-07-13 ENCOUNTER — Ambulatory Visit: Payer: Medicare Other

## 2019-08-10 ENCOUNTER — Telehealth: Payer: Self-pay | Admitting: Cardiovascular Disease

## 2019-08-10 NOTE — Telephone Encounter (Signed)
New Message     Pt is calling about her appt that is scheduled next week for her BP  She says in the community she lives at  had 11 covid cases.  She says she is worried about catching covid or spreading the virus.  She would like for this appt to be virtual if possible     Please call

## 2019-08-10 NOTE — Telephone Encounter (Signed)
Patient cancelled appointment. Will complete phone follow up on January 7th at 2:30pm

## 2019-08-10 NOTE — Telephone Encounter (Signed)
Will send this message to our [facility] Pharmacist pool to further review and follow-up with pts request to switch to virtual visit.

## 2019-08-14 ENCOUNTER — Other Ambulatory Visit: Payer: Self-pay

## 2019-08-14 MED ORDER — APIXABAN 5 MG PO TABS: 5.0000 mg | ORAL_TABLET | Freq: Two times a day (BID) | ORAL | 0 refills | Status: DC

## 2019-08-14 MED ORDER — APIXABAN 5 MG PO TABS: 5.0000 mg | ORAL_TABLET | Freq: Two times a day (BID) | ORAL | 1 refills | Status: DC

## 2019-08-15 ENCOUNTER — Ambulatory Visit: Payer: Medicare Other

## 2019-08-31 ENCOUNTER — Telehealth: Payer: Self-pay | Admitting: Cardiovascular Disease

## 2019-08-31 NOTE — Telephone Encounter (Signed)
Patient spoke with Pharm D today, will forward to Raquel R Pharm D for review

## 2019-08-31 NOTE — Telephone Encounter (Signed)
Pt calling stating that her medication Carvedilol was increase and that she does not have enough medication to take as was directed for her to take. Pt would like a call back concerning this matter. Please address

## 2019-09-01 MED ORDER — CARVEDILOL 12.5 MG PO TABS: ORAL_TABLET | ORAL | 1 refills | Status: DC

## 2019-09-01 NOTE — Telephone Encounter (Signed)
*  PHONE follow up d/t COVID-19 and patient unable to leave assistance living*   HPI: Helen [name] is a 86 y.o. female referred by Dr. Oval Linsey to HTN clinic. PMH includes HF, hypertension, hyperlipidemia, diabetes, hx of stroke, CAD and carotid stenosis.  Current HTN meds:  Carvedilol 12.5mg  twice daily Entresto 49-51mg  twice daily Furosemide 20mg  daily  Previously tried:  Metoprolol   Home BP readings:   138/59 (64); 147/61 (66) 113/59 (66); 154/69 (77) 143/61 (72); 120/63 (72) 122/57 (67); 137/72 (78) 157/61 (87); 114/59 (74) 136/60 (79); 136/60 (79); 139/64(62)  Average 126/62 (HR range 62-78bpm)   Assessment and Plan:  BP and HR remain appropriate for further carvedilol titration. Patient reports some vertigo after initiating carvedilol but resolved at this time. Denies falls, persistent dizziness, swelling or any adverse reaction to medication.   Will increase carvedilol to 12.5mg  every morning anf 25mg  every evening. Plan another phone follow up in 2 weeks to assess tolerability and increase carvedilol to 25mg  BID if possible.  Anda Sobotta Rodriguez-Guzman PharmD, BCPS, [place] [address] 09/01/2019 8:27 AM

## 2019-09-07 DIAGNOSIS — I639 Cerebral infarction, unspecified: Secondary | ICD-10-CM | POA: Diagnosis not present

## 2019-09-07 DIAGNOSIS — I10 Essential (primary) hypertension: Secondary | ICD-10-CM | POA: Diagnosis not present

## 2019-09-07 DIAGNOSIS — F4329 Adjustment disorder with other symptoms: Secondary | ICD-10-CM | POA: Diagnosis not present

## 2019-09-07 DIAGNOSIS — E119 Type 2 diabetes mellitus without complications: Secondary | ICD-10-CM | POA: Diagnosis not present

## 2019-09-07 DIAGNOSIS — E782 Mixed hyperlipidemia: Secondary | ICD-10-CM | POA: Diagnosis not present

## 2019-09-07 DIAGNOSIS — I5042 Chronic combined systolic (congestive) and diastolic (congestive) heart failure: Secondary | ICD-10-CM | POA: Diagnosis not present

## 2019-09-07 DIAGNOSIS — E1122 Type 2 diabetes mellitus with diabetic chronic kidney disease: Secondary | ICD-10-CM | POA: Diagnosis not present

## 2019-09-07 DIAGNOSIS — R5381 Other malaise: Secondary | ICD-10-CM | POA: Diagnosis not present

## 2019-09-07 DIAGNOSIS — I739 Peripheral vascular disease, unspecified: Secondary | ICD-10-CM | POA: Diagnosis not present

## 2019-09-14 ENCOUNTER — Telehealth: Payer: Self-pay | Admitting: Pharmacist

## 2019-09-14 MED ORDER — CARVEDILOL 25 MG PO TABS: 25.0000 mg | ORAL_TABLET | Freq: Two times a day (BID) | ORAL | 1 refills | Status: DC

## 2019-09-14 NOTE — Telephone Encounter (Signed)
Patient doing "pretty good" 121/52 (70) 138/70 (80) 125/49 (72) 138/67(81) 142/65 (80 132/66 (82) 143/67 (73) 130/62 (65) 134/63 (65) 120/61 (67)  *Will increase carvedilol to 25mg  twice daily* Rx sent to prefer pharmacy.  Will follow up by phone in 2 weeks.

## 2019-09-19 ENCOUNTER — Telehealth: Payer: Self-pay | Admitting: Cardiovascular Disease

## 2019-09-19 NOTE — Telephone Encounter (Signed)
Returned call to patient-letter created and sent via [employer].  Patient verbalized understanding.

## 2019-09-19 NOTE — Telephone Encounter (Signed)
Patient calling stating she needs permission in writing for her to get the covid vaccine. She says she needs it by Friday 1/29.

## 2019-09-19 NOTE — Telephone Encounter (Signed)
Sent letter via [employer]

## 2019-09-19 NOTE — Telephone Encounter (Signed)
Patient calling back with the fax number to send her letter. Fax: [phone]

## 2019-10-06 ENCOUNTER — Telehealth: Payer: Self-pay | Admitting: Cardiovascular Disease

## 2019-10-06 NOTE — Telephone Encounter (Signed)
New Message   Patient is calling because in Jan her medication was changed for her BP (carvedilol). She states that she was to hear something in Feb around 2/4 to see how her BP was running and she has yet to hear anything. See notes on 09/14/2019  She states that she has been doing pretty well:   10/06/2019   120/60 HR 72 10/05/2019    129/65 HR 72  10/04/2019    133/62  HR 78

## 2019-10-09 NOTE — Telephone Encounter (Signed)
Patient taking Carvedilol 25 mg twice a day  Advised to continue current medications and will forward to Pharm D for review. Will only call back if any changes

## 2019-10-10 NOTE — Telephone Encounter (Signed)
Patient denies problems with current therapy.  Instructed to continue all medication as prescribed and okay to hold furosemide if needed.

## 2019-10-16 ENCOUNTER — Other Ambulatory Visit: Payer: Self-pay | Admitting: Pharmacist

## 2019-10-16 NOTE — Telephone Encounter (Signed)
*  STAT* If patient is at the pharmacy, call can be transferred to refill team.   1. Which medications need to be refilled? (please list name of each medication and dose if known) carvedilol (COREG) 25 MG tablet  2. Which pharmacy/location (including street and city if local pharmacy) is medication to be sent to? [place], [place]  3. Do they need a 30 day or 90 day supply? 90 day

## 2019-10-18 MED ORDER — CARVEDILOL 25 MG PO TABS: 25.0000 mg | ORAL_TABLET | Freq: Two times a day (BID) | ORAL | 3 refills | Status: DC

## 2019-10-18 NOTE — Telephone Encounter (Signed)
Rx(s) sent to pharmacy electronically.  

## 2019-12-01 ENCOUNTER — Telehealth: Payer: Self-pay | Admitting: Cardiovascular Disease

## 2019-12-01 DIAGNOSIS — F4329 Adjustment disorder with other symptoms: Secondary | ICD-10-CM | POA: Diagnosis not present

## 2019-12-01 DIAGNOSIS — F419 Anxiety disorder, unspecified: Secondary | ICD-10-CM | POA: Diagnosis not present

## 2019-12-01 NOTE — Telephone Encounter (Signed)
Message has been left on the daughter's voicemail, per the dpr, to stop the aspirin. She has been advised to call back with any questions.

## 2019-12-01 NOTE — Telephone Encounter (Signed)
Returned the call to the daughter, per the dpr. She was calling to verify whether the patient should be on the Aspirin and Eliquis.  Her PCP will be starting her on Celexa and stated that there is a possible reaction between the aspirin and Celexa.

## 2019-12-01 NOTE — Telephone Encounter (Signed)
OK for her to be on ELiquis only.  No aspirin.

## 2019-12-01 NOTE — Telephone Encounter (Signed)
Pt c/o medication issue:  1. Name of Medication: aspirin EC 81 MG tablet  2. How are you currently taking this medication (dosage and times per day)? Not sure if she's taking  3. Are you having a reaction (difficulty breathing--STAT)? no  4. What is your medication issue? Patient's daughter [name] calling to confirm whether the patient is to not take Asprin while on eliquis.

## 2019-12-06 DIAGNOSIS — R457 State of emotional shock and stress, unspecified: Secondary | ICD-10-CM | POA: Diagnosis not present

## 2019-12-06 DIAGNOSIS — R61 Generalized hyperhidrosis: Secondary | ICD-10-CM | POA: Diagnosis not present

## 2019-12-06 DIAGNOSIS — I959 Hypotension, unspecified: Secondary | ICD-10-CM | POA: Diagnosis not present

## 2019-12-06 DIAGNOSIS — R0902 Hypoxemia: Secondary | ICD-10-CM | POA: Diagnosis not present

## 2019-12-06 DIAGNOSIS — I10 Essential (primary) hypertension: Secondary | ICD-10-CM | POA: Diagnosis not present

## 2019-12-08 ENCOUNTER — Inpatient Hospital Stay (HOSPITAL_COMMUNITY)
Admission: EM | Admit: 2019-12-08 | Discharge: 2019-12-14 | DRG: 812 | Disposition: A | Discharge status: Home Health | Payer: Medicare Other | Source: Skilled Nursing Facility | Attending: Internal Medicine | Admitting: Internal Medicine

## 2019-12-08 ENCOUNTER — Other Ambulatory Visit: Payer: Self-pay

## 2019-12-08 ENCOUNTER — Encounter (HOSPITAL_COMMUNITY): Payer: Self-pay | Admitting: Internal Medicine

## 2019-12-08 DIAGNOSIS — Z833 Family history of diabetes mellitus: Secondary | ICD-10-CM

## 2019-12-08 DIAGNOSIS — Z882 Allergy status to sulfonamides status: Secondary | ICD-10-CM

## 2019-12-08 DIAGNOSIS — Z79899 Other long term (current) drug therapy: Secondary | ICD-10-CM

## 2019-12-08 DIAGNOSIS — Z7901 Long term (current) use of anticoagulants: Secondary | ICD-10-CM

## 2019-12-08 DIAGNOSIS — Z853 Personal history of malignant neoplasm of breast: Secondary | ICD-10-CM

## 2019-12-08 DIAGNOSIS — K2900 Acute gastritis without bleeding: Secondary | ICD-10-CM | POA: Diagnosis present

## 2019-12-08 DIAGNOSIS — I10 Essential (primary) hypertension: Secondary | ICD-10-CM | POA: Diagnosis not present

## 2019-12-08 DIAGNOSIS — Z90710 Acquired absence of both cervix and uterus: Secondary | ICD-10-CM

## 2019-12-08 DIAGNOSIS — K219 Gastro-esophageal reflux disease without esophagitis: Secondary | ICD-10-CM | POA: Diagnosis present

## 2019-12-08 DIAGNOSIS — R110 Nausea: Secondary | ICD-10-CM

## 2019-12-08 DIAGNOSIS — I428 Other cardiomyopathies: Secondary | ICD-10-CM | POA: Diagnosis not present

## 2019-12-08 DIAGNOSIS — Z83438 Family history of other disorder of lipoprotein metabolism and other lipidemia: Secondary | ICD-10-CM

## 2019-12-08 DIAGNOSIS — D638 Anemia in other chronic diseases classified elsewhere: Secondary | ICD-10-CM | POA: Diagnosis present

## 2019-12-08 DIAGNOSIS — Z9049 Acquired absence of other specified parts of digestive tract: Secondary | ICD-10-CM

## 2019-12-08 DIAGNOSIS — Z66 Do not resuscitate: Secondary | ICD-10-CM | POA: Diagnosis not present

## 2019-12-08 DIAGNOSIS — Z20822 Contact with and (suspected) exposure to covid-19: Secondary | ICD-10-CM | POA: Diagnosis present

## 2019-12-08 DIAGNOSIS — E1151 Type 2 diabetes mellitus with diabetic peripheral angiopathy without gangrene: Secondary | ICD-10-CM | POA: Diagnosis present

## 2019-12-08 DIAGNOSIS — R5381 Other malaise: Secondary | ICD-10-CM | POA: Diagnosis not present

## 2019-12-08 DIAGNOSIS — Z9013 Acquired absence of bilateral breasts and nipples: Secondary | ICD-10-CM

## 2019-12-08 DIAGNOSIS — K640 First degree hemorrhoids: Secondary | ICD-10-CM | POA: Diagnosis present

## 2019-12-08 DIAGNOSIS — E785 Hyperlipidemia, unspecified: Secondary | ICD-10-CM | POA: Diagnosis present

## 2019-12-08 DIAGNOSIS — R0902 Hypoxemia: Secondary | ICD-10-CM | POA: Diagnosis not present

## 2019-12-08 DIAGNOSIS — Z885 Allergy status to narcotic agent status: Secondary | ICD-10-CM

## 2019-12-08 DIAGNOSIS — I480 Paroxysmal atrial fibrillation: Secondary | ICD-10-CM | POA: Diagnosis present

## 2019-12-08 DIAGNOSIS — K5730 Diverticulosis of large intestine without perforation or abscess without bleeding: Secondary | ICD-10-CM | POA: Diagnosis present

## 2019-12-08 DIAGNOSIS — E871 Hypo-osmolality and hyponatremia: Secondary | ICD-10-CM | POA: Diagnosis present

## 2019-12-08 DIAGNOSIS — D509 Iron deficiency anemia, unspecified: Secondary | ICD-10-CM | POA: Diagnosis not present

## 2019-12-08 DIAGNOSIS — Z03818 Encounter for observation for suspected exposure to other biological agents ruled out: Secondary | ICD-10-CM | POA: Diagnosis not present

## 2019-12-08 DIAGNOSIS — Z8249 Family history of ischemic heart disease and other diseases of the circulatory system: Secondary | ICD-10-CM

## 2019-12-08 DIAGNOSIS — I110 Hypertensive heart disease with heart failure: Secondary | ICD-10-CM | POA: Diagnosis present

## 2019-12-08 DIAGNOSIS — I739 Peripheral vascular disease, unspecified: Secondary | ICD-10-CM | POA: Diagnosis present

## 2019-12-08 DIAGNOSIS — I5042 Chronic combined systolic (congestive) and diastolic (congestive) heart failure: Secondary | ICD-10-CM | POA: Diagnosis present

## 2019-12-08 DIAGNOSIS — Z8673 Personal history of transient ischemic attack (TIA), and cerebral infarction without residual deficits: Secondary | ICD-10-CM

## 2019-12-08 DIAGNOSIS — Z823 Family history of stroke: Secondary | ICD-10-CM

## 2019-12-08 DIAGNOSIS — D72829 Elevated white blood cell count, unspecified: Secondary | ICD-10-CM | POA: Diagnosis present

## 2019-12-08 DIAGNOSIS — F419 Anxiety disorder, unspecified: Secondary | ICD-10-CM | POA: Diagnosis present

## 2019-12-08 DIAGNOSIS — E119 Type 2 diabetes mellitus without complications: Secondary | ICD-10-CM

## 2019-12-08 DIAGNOSIS — K317 Polyp of stomach and duodenum: Secondary | ICD-10-CM | POA: Diagnosis present

## 2019-12-08 DIAGNOSIS — Z888 Allergy status to other drugs, medicaments and biological substances status: Secondary | ICD-10-CM

## 2019-12-08 DIAGNOSIS — Z87891 Personal history of nicotine dependence: Secondary | ICD-10-CM

## 2019-12-08 DIAGNOSIS — F329 Major depressive disorder, single episode, unspecified: Secondary | ICD-10-CM | POA: Diagnosis present

## 2019-12-08 DIAGNOSIS — K621 Rectal polyp: Secondary | ICD-10-CM | POA: Diagnosis present

## 2019-12-08 LAB — BASIC METABOLIC PANEL
Anion gap: 10 (ref 5–15)
Anion gap: 8 (ref 5–15)
BUN: 11 mg/dL (ref 8–23)
BUN: 11 mg/dL (ref 8–23)
CO2: 26 mmol/L (ref 22–32)
CO2: 27 mmol/L (ref 22–32)
Calcium: 8.6 mg/dL — ABNORMAL LOW (ref 8.9–10.3)
Calcium: 8.2 mg/dL — ABNORMAL LOW (ref 8.9–10.3)
Chloride: 89 mmol/L — ABNORMAL LOW (ref 98–111)
Chloride: 90 mmol/L — ABNORMAL LOW (ref 98–111)
Creatinine, Ser: 0.78 mg/dL (ref 0.44–1.00)
Creatinine, Ser: 0.72 mg/dL (ref 0.44–1.00)
GFR calc Af Amer: >60 mL/min (ref >60)
GFR calc Af Amer: >60 mL/min (ref >60)
GFR calc non Af Amer: >60 mL/min (ref >60)
GFR calc non Af Amer: >60 mL/min (ref >60)
Glucose, Bld: 147 mg/dL — ABNORMAL HIGH (ref 70–99)
Glucose, Bld: 129 mg/dL — ABNORMAL HIGH (ref 70–99)
Potassium: 4.1 mmol/L (ref 3.5–5.1)
Potassium: 4.4 mmol/L (ref 3.5–5.1)
Sodium: 125 mmol/L — ABNORMAL LOW (ref 135–145)
Sodium: 125 mmol/L — ABNORMAL LOW (ref 135–145)

## 2019-12-08 LAB — CBC
HCT: 25 % — ABNORMAL LOW (ref 36.0–46.0)
Hemoglobin: 7.8 g/dL — ABNORMAL LOW (ref 12.0–15.0)
MCH: 26.8 pg (ref 26.0–34.0)
MCHC: 31.2 g/dL (ref 30.0–36.0)
MCV: 85.9 fL (ref 80.0–100.0)
Platelets: 223 10*3/uL (ref 150–400)
RBC: 2.91 MIL/uL — ABNORMAL LOW (ref 3.87–5.11)
RDW: 17.1 % — ABNORMAL HIGH (ref 11.5–15.5)
WBC: 11.1 10*3/uL — ABNORMAL HIGH (ref 4.0–10.5)
nRBC: 0 % (ref 0.0–0.2)

## 2019-12-08 LAB — RETICULOCYTES
Immature Retic Fract: 29.6 % — ABNORMAL HIGH (ref 2.3–15.9)
RBC.: 2.9 MIL/uL — ABNORMAL LOW (ref 3.87–5.11)
Retic Count, Absolute: 98.3 10*3/uL (ref 19.0–186.0)
Retic Ct Pct: 3.4 % — ABNORMAL HIGH (ref 0.4–3.1)

## 2019-12-08 LAB — FOLATE: Folate: 20.2 ng/mL (ref >5.9)

## 2019-12-08 LAB — IRON AND TIBC
Iron: 33 ug/dL (ref 28–170)
Saturation Ratios: 7 % — ABNORMAL LOW (ref 10.4–31.8)
TIBC: 451 ug/dL — ABNORMAL HIGH (ref 250–450)
UIBC: 418 ug/dL

## 2019-12-08 LAB — SARS CORONAVIRUS 2 (TAT 6-24 HRS): SARS Coronavirus 2: NEGATIVE

## 2019-12-08 LAB — VITAMIN B12: Vitamin B-12: 338 pg/mL (ref 180–914)

## 2019-12-08 LAB — FERRITIN: Ferritin: 11 ng/mL (ref 11–307)

## 2019-12-08 MED ORDER — FLUTICASONE PROPIONATE 50 MCG/ACT NA SUSP: 1.0000 | Freq: Every day | NASAL | Status: DC | PRN

## 2019-12-08 MED ORDER — MECLIZINE HCL 25 MG PO TABS: 12.5000 mg | ORAL_TABLET | Freq: Two times a day (BID) | ORAL | Status: DC | PRN

## 2019-12-08 MED ORDER — ACETAMINOPHEN 650 MG RE SUPP: 650.0000 mg | Freq: Four times a day (QID) | RECTAL | Status: DC | PRN

## 2019-12-08 MED ORDER — APIXABAN 5 MG PO TABS
5.0000 mg | ORAL_TABLET | Freq: Two times a day (BID) | ORAL | Status: DC
  Administered 2019-12-08 – 2019-12-09 (×3): 5 mg via ORAL
  Filled 2019-12-08 (×3): qty 1

## 2019-12-08 MED ORDER — SODIUM CHLORIDE 0.9 % IV SOLN: Freq: Once | INTRAVENOUS | Status: AC

## 2019-12-08 MED ORDER — ALPRAZOLAM 0.5 MG PO TABS: 0.5000 mg | ORAL_TABLET | Freq: Two times a day (BID) | ORAL | Status: DC

## 2019-12-08 MED ORDER — SACUBITRIL-VALSARTAN 49-51 MG PO TABS
1.0000 | ORAL_TABLET | Freq: Two times a day (BID) | ORAL | Status: DC
  Administered 2019-12-08 – 2019-12-11 (×8): 1 via ORAL
  Filled 2019-12-08 (×9): qty 1

## 2019-12-08 MED ORDER — SODIUM CHLORIDE 0.9% FLUSH
3.0000 mL | Freq: Once | INTRAVENOUS | Status: AC
  Administered 2019-12-08: 3 mL via INTRAVENOUS

## 2019-12-08 MED ORDER — CARVEDILOL 25 MG PO TABS
25.0000 mg | ORAL_TABLET | Freq: Two times a day (BID) | ORAL | Status: DC
  Administered 2019-12-08 – 2019-12-14 (×12): 25 mg via ORAL
  Filled 2019-12-08 (×12): qty 1

## 2019-12-08 MED ORDER — ACETAMINOPHEN 325 MG PO TABS
650.0000 mg | ORAL_TABLET | Freq: Four times a day (QID) | ORAL | Status: DC | PRN
  Administered 2019-12-08: 23:00:00 650 mg via ORAL
  Filled 2019-12-08: qty 2

## 2019-12-08 MED ORDER — ONDANSETRON HCL 4 MG/2ML IJ SOLN
4.0000 mg | Freq: Four times a day (QID) | INTRAMUSCULAR | Status: DC | PRN
  Administered 2019-12-10: 4 mg via INTRAVENOUS
  Filled 2019-12-08: qty 2

## 2019-12-08 MED ORDER — ONDANSETRON HCL 4 MG PO TABS
4.0000 mg | ORAL_TABLET | Freq: Four times a day (QID) | ORAL | Status: DC | PRN
  Administered 2019-12-09: 4 mg via ORAL
  Filled 2019-12-08: qty 1

## 2019-12-08 MED ORDER — ATORVASTATIN CALCIUM 40 MG PO TABS
80.0000 mg | ORAL_TABLET | Freq: Every day | ORAL | Status: DC
  Administered 2019-12-08 – 2019-12-13 (×6): 80 mg via ORAL
  Filled 2019-12-08 (×6): qty 2

## 2019-12-08 MED ORDER — SODIUM CHLORIDE 0.9 % IV SOLN
INTRAVENOUS | Status: AC
  Administered 2019-12-08: 50 mL via INTRAVENOUS

## 2019-12-08 MED ORDER — ALPRAZOLAM 0.25 MG PO TABS
0.2500 mg | ORAL_TABLET | Freq: Two times a day (BID) | ORAL | Status: DC | PRN
  Administered 2019-12-09 – 2019-12-13 (×8): 0.25 mg via ORAL
  Filled 2019-12-08 (×8): qty 1

## 2019-12-08 MED ORDER — PANTOPRAZOLE SODIUM 40 MG PO TBEC
40.0000 mg | DELAYED_RELEASE_TABLET | Freq: Every day | ORAL | Status: DC
  Administered 2019-12-08 – 2019-12-14 (×6): 40 mg via ORAL
  Filled 2019-12-08 (×6): qty 1

## 2019-12-08 NOTE — ED Notes (Signed)
Pts daughter [name] updated on plan of care and pt status with verbal permission from pt. [name] (Daughter- [place]) [phone]

## 2019-12-08 NOTE — ED Provider Notes (Signed)
[facility] DEPT Provider Note   CSN: [id number] Arrival date & time: 12/08/19  [id number]   History Chief Complaint  Patient presents with  . Dizziness  . Nausea    Helen [name] is a 86 y.o. female.  The history is provided by the patient.  Dizziness She has history of hypertension, diabetes, hyperlipidemia, peripheral vascular disease, combined systolic and diastolic heart failure and comes in because of nausea.  She woke up this morning with nausea, but did not vomit.  She had the same thing happened 2 days ago.  EMS came to the home 2 days ago, and everything checked out and she did not seek medical care.  She denies dizziness or vertigo, but states that when her eyes are closed, does feel like some things are moving.  She denies ear pain, headache, tinnitus, hearing loss.  She denies chest pain, heaviness, tightness, pressure.  She denies any dyspnea.  She does have medication for vertigo, but it is very old and she did not take any.  Past Medical History:  Diagnosis Date  . Anemia   . Cancer [facility])    Breast cancer  . Chronic combined systolic (congestive) and diastolic (congestive) heart failure (HCC)    a. EF 25-30% by echo in 11/2016. Recent NST showing no inducible ischemia --> therefore thought to be most consistent with nonischemic cardiomyopathy.   . Diabetes mellitus   . History of stroke 11/2016   incidental finding on MRI  . Hypercholesteremia   . Hypertension   . Peripheral vascular disease ([place]) 2015   bilateral moderate CA disease-Dr Bridgett Larsson    Patient Active Problem List   Diagnosis Date Noted  . Nonischemic cardiomyopathy ([place]) 12/08/2016  . SOB (shortness of breath) 11/12/2016  . Shortness of breath 11/11/2016  . Chronic respiratory failure with hypoxia ([place]) 11/11/2016  . Anemia of chronic disease 03/24/2016  . Chronic combined systolic and diastolic heart failure ([place]) 03/24/2016  . Acute respiratory failure with hypoxia  ([place]) 03/24/2016  . Scarring of lung following radiation- [facility] 03/24/2016  . Essential hypertension   . Acute encephalopathy 12/13/2015  . Hyponatremia 12/13/2015  . Hypokalemia 12/13/2015  . UTI (lower urinary tract infection) 12/13/2015  . Leukocytosis 12/13/2015  . Hypercholesteremia   . Diabetes mellitus without complication ([place])   . History of stroke   . Peripheral vascular disease ([place])   . Carotid stenosis 03/13/2015    Past Surgical History:  Procedure Laterality Date  . ABDOMINAL HYSTERECTOMY    . APPENDECTOMY  1951  . [place]  . CHOLECYSTECTOMY  2003   By Dr. Excell Seltzer  . EYE SURGERY Bilateral 2009   Cataract surgery  . FRACTURE SURGERY Right    ORIF   by Dr. Burney Gauze  . HUMERUS FRACTURE SURGERY    . MASTECTOMY Bilateral   . NASAL SEPTUM SURGERY  1977   Dr. Ernesto Rutherford  . TOENAIL EXCISION       OB History   No obstetric history on file.     Family History  Problem Relation Age of Onset  . Cancer Mother   . Pulmonary embolism Mother   . Heart disease Father   . Heart attack Father   . Stroke Father   . Diabetes Brother   . Hyperlipidemia Brother   . Stroke Brother   . Kidney disease Brother     Social History   Tobacco Use  . Smoking status: Former Smoker    Quit date: 08/24/1978  Years since quitting: 41.3  . Smokeless tobacco: Never Used  Substance Use Topics  . Alcohol use: No  . Drug use: No    Home Medications Prior to Admission medications   Medication Sig Start Date End Date Taking? Authorizing Provider  ALPRAZolam (XANAX XR) 0.5 MG 24 hr tablet Take 0.5 mg by mouth daily.    [provider]  apixaban (ELIQUIS) 5 MG TABS tablet Take 1 tablet (5 mg total) by mouth 2 (two) times daily. 08/14/19   Skeet Latch, MD  atorvastatin (LIPITOR) 80 MG tablet Take 80 mg by mouth at bedtime.    [provider]  Biotin 100 MG/GM POWD Take 100 mg by mouth daily.    [provider]  carvedilol  (COREG) 25 MG tablet Take 1 tablet (25 mg total) by mouth 2 (two) times daily with a meal. 10/18/19   Skeet Latch, MD  cholecalciferol (VITAMIN D) 1000 units tablet Take 1,000 Units by mouth every evening.     [provider]  ENTRESTO 49-51 MG TAKE 1 TABLET BY MOUTH  TWICE DAILY 03/30/19   Skeet Latch, MD  fluticasone [facility]) 50 MCG/ACT nasal spray Place 1 spray into both nostrils daily as needed for allergies or rhinitis.     [provider]  furosemide (LASIX) 20 MG tablet TAKE 1 TABLET BY MOUTH  DAILY 06/20/19   Skeet Latch, MD  magnesium oxide (MAG-OX) 400 MG tablet 1 tablet by mouth twice a day for 1 week and then 1 daily Patient taking differently: daily.  03/24/18   Skeet Latch, MD  meclizine (ANTIVERT) 25 MG tablet TAKE ONE TABLET BY MOUTH THREE TIMES A DAY AS NEEDED FOR DIZZINESS 03/09/18   Skeet Latch, MD  Multiple Vitamin (MULTIVITAMIN WITH MINERALS) TABS tablet Take 1 tablet by mouth daily.     [provider]  pantoprazole (PROTONIX) 40 MG tablet  05/27/18   [provider]  vitamin C (ASCORBIC ACID) 500 MG tablet Take 500 mg by mouth daily.     [provider]    Allergies    Morphine and related, Other, Sulfa antibiotics, Sulfasalazine, and Nickel  Review of Systems   Review of Systems  Neurological: Positive for dizziness.  All other systems reviewed and are negative.   Physical Exam Updated Vital Signs BP 126/77   Pulse 62   Temp 97.7 F (36.5 C)   Resp 16   Ht 5\' 3"  (1.6 m)   Wt 67.6 kg   SpO2 93%   BMI 26.39 kg/m   Physical Exam Vitals and nursing note reviewed.   86 year old female, resting comfortably and in no acute distress. Vital signs are normal. Oxygen saturation is 93%, which is normal. Head is normocephalic and atraumatic. PERRLA, EOMI. Oropharynx is clear.  There is no nystagmus. Neck is nontender and supple without adenopathy or JVD.  There are no carotid bruits. Back is  nontender and there is no CVA tenderness. Lungs are clear without rales, wheezes, or rhonchi. Chest is nontender. Heart has regular rate and rhythm without murmur. Abdomen is soft, flat, nontender without masses or hepatosplenomegaly and peristalsis is normoactive. Extremities have no cyanosis or edema, full range of motion is present. Skin is warm and dry without rash. Neurologic: Mental status is normal, cranial nerves are intact, there are no motor or sensory deficits.  Symptoms are reproduced by passive head movement.  ED Results / Procedures / Treatments   Labs (all labs ordered are listed, but only abnormal  results are displayed) Labs Reviewed  BASIC METABOLIC PANEL  CBC  URINALYSIS, ROUTINE W REFLEX MICROSCOPIC  CBG MONITORING, ED    EKG EKG Interpretation  Date/Time:  Friday December 08 2019 06:49:54 EDT Ventricular Rate:  60 PR Interval:    QRS Duration: 96 QT Interval:  459 QTC Calculation: 459 R Axis:   65 Text Interpretation: Sinus rhythm Prolonged PR interval Low voltage, precordial leads Minimal ST depression, inferior leads When compared with ECG of 04/04/2018, T wave abnormality has improved Premature ventricular complexes are no longer present Confirmed by Delora Fuel ([id number]) on 12/08/2019 6:57:43 AM  Procedures Procedures   Medications Ordered in ED Medications  sodium chloride flush (NS) 0.9 % injection 3 mL (3 mLs Intravenous Given 12/08/19 [id number])    ED Course  I have reviewed the triage vital signs and the nursing notes.  Pertinent lab results that were available during my care of the patient were reviewed by me and considered in my medical decision making (see chart for details).  MDM Rules/Calculators/A&P Nausea which I suspect is actually part of peripheral vertigo.  Old records are reviewed, and she has no relevant past visits.  ECG shows no acute changes.  Will check screening labs, give IV ondansetron and oral meclizine and reassess.  Case is signed  out to Dr. Laverta Baltimore.  Final Clinical Impression(s) / ED Diagnoses Final diagnoses:  Nausea    Rx / DC Orders ED Discharge Orders    None       Delora Fuel, [place] 123456 [phone]

## 2019-12-08 NOTE — ED Triage Notes (Signed)
Pt brought in by EMS stating she woke up feeling dizzy and nauseous. Pt has a hx of vertigo which she takes medicine for but it's out.

## 2019-12-08 NOTE — Evaluation (Signed)
Physical Therapy Evaluation Patient Details Name: Helen [name] MRN: [id number] DOB: 1929-11-09 Today's Date: 12/08/2019   History of Present Illness  86 y.o. female with medical history significant for chronic combined systolic and diastolic congestive heart failure with EF 25 to 30%, diet-controlled type 2 diabetes, history of stroke, anemia, hypertension,  hypercholesterolemia, on Eliquis being admitted to the hospital with anemia and hyponatremia.  Clinical Impression  Pt admitted with above diagnosis.  Pt very pleasant and willing to get OOB, however she is fatigued from being in the ED all night/day. Politely requests to go back to bed after amb to bathroom.  Pt is independent at her baseline, may need HHPT depending on progress.  Pt currently with functional limitations due to the deficits listed below (see PT Problem List). Pt will benefit from skilled PT to increase their independence and safety with mobility to allow discharge to the venue listed below.      Follow Up Recommendations Home health PT(vs no f/u)    Equipment Recommendations  None recommended by PT    Recommendations for Other Services       Precautions / Restrictions Precautions Precautions: Fall      Mobility  Bed Mobility Overal bed mobility: Needs Assistance Bed Mobility: Supine to Sit     Supine to sit: Min guard     General bed mobility comments: for safety  Transfers Overall transfer level: Needs assistance Equipment used: Rolling walker (2 wheeled) Transfers: Sit to/from Stand Sit to Stand: Min guard         General transfer comment: for safety to rise and transition to RW  Ambulation/Gait Ambulation/Gait assistance: Min guard;Min assist Gait Distance (Feet): 15 Feet(x2) Assistive device: Rolling walker (2 wheeled) Gait Pattern/deviations: Step-through pattern;Decreased stride length     General Gait Details: cues for RW safety  Stairs            Wheelchair Mobility     Modified Rankin (Stroke Patients Only)       Balance                                             Pertinent Vitals/Pain Pain Assessment: No/denies pain    Home Living Family/patient expects to be discharged to:: Other (Comment)                 Additional Comments: from ILF, [employer]    Prior Function Level of Independence: Independent;Independent with assistive device(s)         Comments: amb to dining room and laundry with rollator     Hand Dominance        Extremity/Trunk Assessment   Upper Extremity Assessment Upper Extremity Assessment: Overall WFL for tasks assessed    Lower Extremity Assessment Lower Extremity Assessment: Overall WFL for tasks assessed       Communication   Communication: No difficulties  Cognition Arousal/Alertness: Awake/alert Behavior During Therapy: WFL for tasks assessed/performed Overall Cognitive Status: Within Functional Limits for tasks assessed                                        General Comments      Exercises     Assessment/Plan    PT Assessment Patient needs continued PT services  PT Problem List Decreased strength;Decreased  mobility;Decreased balance;Decreased activity tolerance;Decreased knowledge of use of DME       PT Treatment Interventions DME instruction;Therapeutic exercise;Gait training;Functional mobility training;Therapeutic activities;Patient/family education    PT Goals (Current goals can be found in the Care Plan section)  Acute Rehab PT Goals Patient Stated Goal: to get back hom e PT Goal Formulation: With patient Time For Goal Achievement: 12/15/19 Potential to Achieve Goals: Good    Frequency Min 3X/week   Barriers to discharge        Co-evaluation               AM-PAC PT "6 Clicks" Mobility  Outcome Measure Help needed turning from your back to your side while in a flat bed without using bedrails?: None Help needed moving  from lying on your back to sitting on the side of a flat bed without using bedrails?: None Help needed moving to and from a bed to a chair (including a wheelchair)?: A Little Help needed standing up from a chair using your arms (e.g., wheelchair or bedside chair)?: A Little Help needed to walk in hospital room?: A Little Help needed climbing 3-5 steps with a railing? : A Little 6 Click Score: 20    End of Session   Activity Tolerance: Patient tolerated treatment well Patient left: in bed;with call bell/phone within reach;with bed alarm set Nurse Communication: Mobility status PT Visit Diagnosis: Difficulty in walking, not elsewhere classified (R26.2);Unsteadiness on feet (R26.81)    Time: [id number] PT Time Calculation (min) (ACUTE ONLY): 20 min   Charges:   PT Evaluation $PT Eval Low Complexity: 1 Low          Tramaine Snell, PT   Acute Rehab Dept [facility]): [id number]   12/08/2019   [facility] 12/08/2019, 3:08 PM

## 2019-12-08 NOTE — H&P (Signed)
History and Physical  Helen [name] [id number] DOB: 07-28-1930 DOA: 12/08/2019   PCP: Vernie Shanks, MD   Patient coming from: inependent living facility.  Chief Complaint: Weakness, diaphoresis  HPI: Helen [name] is a 86 y.o. female with medical history significant for chronic combined systolic and diastolic congestive heart failure with EF 25 to 30%, diet-controlled type 2 diabetes, history of stroke, anemia, hypertension,  hypercholesterolemia, on Eliquis being admitted to the hospital with anemia and hyponatremia.  The patient tells me that she has actually had difficulty with balance for the last couple of years, has never been able to figure out why and get a definitive diagnosis.  In any case, 2 or 3 days ago she woke up with night sweats and called EMS, says she was checked out and not brought to the hospital.  Then last night, she again woke up with night sweats and was having some difficulty with her balance so was brought to the emergency department for evaluation.  She denies any nausea vomiting, fevers, chills, cough, shortness of breath, any falls, she denies any hemoptysis, frank blood in her stool but says that it has looked a little bit dark of late.  On further discussion with the patient, she tells me that she is being weaned off of Xanax by her PCP, and she was started on Celexa about 2 weeks ago.  She feels that she has been eating drinking, urinating and having bowel movements normally without any difficulty.  ED Course: In the emergency department, vital signs were relatively unremarkable, she was found to have a hemoglobin of 7 and sodium 125 which are both new findings since she last had labs.  Note that her last CBC here was in 2019, and her last sodium in our system was normal in July 2020.  Review of Systems: Please see HPI for pertinent positives and negatives. A complete 10 system review of systems are otherwise negative.  Past Medical History:    Diagnosis Date  . Anemia   . Cancer [facility])    Breast cancer  . Chronic combined systolic (congestive) and diastolic (congestive) heart failure (HCC)    a. EF 25-30% by echo in 11/2016. Recent NST showing no inducible ischemia --> therefore thought to be most consistent with nonischemic cardiomyopathy.   . Diabetes mellitus   . History of stroke 11/2016   incidental finding on MRI  . Hypercholesteremia   . Hypertension   . Peripheral vascular disease ([place]) 2015   bilateral moderate CA disease-Dr Bridgett Larsson   Past Surgical History:  Procedure Laterality Date  . ABDOMINAL HYSTERECTOMY    . APPENDECTOMY  1951  . [place]  . CHOLECYSTECTOMY  2003   By Dr. Excell Seltzer  . EYE SURGERY Bilateral 2009   Cataract surgery  . FRACTURE SURGERY Right    ORIF   by Dr. Burney Gauze  . HUMERUS FRACTURE SURGERY    . MASTECTOMY Bilateral   . NASAL SEPTUM SURGERY  1977   Dr. Ernesto Rutherford  . TOENAIL EXCISION      Social History:  reports that she quit smoking about 41 years ago. She has never used smokeless tobacco. She reports that she does not drink alcohol or use drugs.   Allergies  Allergen Reactions  . Morphine And Related Nausea And Vomiting  . Other Other (See Comments)    Other reaction(s): Other (See Comments) Barley & Carrots: learned through allergy testing. Unknown reaction-- MD said not very allergic but not to  eat large quantities Barley & Carrots: learned through allergy testing. Unknown reaction-- MD said not very allergic but not to eat large quantities  . Sulfa Antibiotics Hives  . Sulfasalazine Hives  . Nickel Rash    Family History  Problem Relation Age of Onset  . Cancer Mother   . Pulmonary embolism Mother   . Heart disease Father   . Heart attack Father   . Stroke Father   . Diabetes Brother   . Hyperlipidemia Brother   . Stroke Brother   . Kidney disease Brother      Prior to Admission medications   Medication Sig Start Date End Date Taking?  Authorizing Provider  ALPRAZolam (XANAX XR) 0.5 MG 24 hr tablet Take 0.5 mg by mouth daily.    [provider]  apixaban (ELIQUIS) 5 MG TABS tablet Take 1 tablet (5 mg total) by mouth 2 (two) times daily. 08/14/19   Skeet Latch, MD  atorvastatin (LIPITOR) 80 MG tablet Take 80 mg by mouth at bedtime.    [provider]  Biotin 100 MG/GM POWD Take 100 mg by mouth daily.    [provider]  carvedilol (COREG) 25 MG tablet Take 1 tablet (25 mg total) by mouth 2 (two) times daily with a meal. 10/18/19   Skeet Latch, MD  cholecalciferol (VITAMIN D) 1000 units tablet Take 1,000 Units by mouth every evening.     [provider]  ENTRESTO 49-51 MG TAKE 1 TABLET BY MOUTH  TWICE DAILY 03/30/19   Skeet Latch, MD  fluticasone [facility]) 50 MCG/ACT nasal spray Place 1 spray into both nostrils daily as needed for allergies or rhinitis.     [provider]  furosemide (LASIX) 20 MG tablet TAKE 1 TABLET BY MOUTH  DAILY 06/20/19   Skeet Latch, MD  magnesium oxide (MAG-OX) 400 MG tablet 1 tablet by mouth twice a day for 1 week and then 1 daily Patient taking differently: daily.  03/24/18   Skeet Latch, MD  meclizine (ANTIVERT) 25 MG tablet TAKE ONE TABLET BY MOUTH THREE TIMES A DAY AS NEEDED FOR DIZZINESS 03/09/18   Skeet Latch, MD  Multiple Vitamin (MULTIVITAMIN WITH MINERALS) TABS tablet Take 1 tablet by mouth daily.     [provider]  pantoprazole (PROTONIX) 40 MG tablet  05/27/18   [provider]  vitamin C (ASCORBIC ACID) 500 MG tablet Take 500 mg by mouth daily.     [provider]    Physical Exam: BP 119/72   Pulse (!) 59   Temp 97.7 F (36.5 C)   Resp 19   Ht 5\' 3"  (1.6 m)   Wt 67.6 kg   SpO2 95%   BMI 26.39 kg/m   General:  Alert, oriented, calm, in no acute distress  Eyes: EOMI, clear conjuctivae, white sclerea Neck: supple, no masses, trachea mildline  Cardiovascular: RRR, no murmurs or  rubs, no peripheral edema  Respiratory: clear to auscultation bilaterally, no wheezes, no crackles  Abdomen: soft, nontender, nondistended, normal bowel tones heard  Skin: dry, no rashes  Musculoskeletal: no joint effusions, normal range of motion  Psychiatric: appropriate affect, normal speech  Neurologic: extraocular muscles intact, clear speech, moving all extremities with intact sensorium            Labs on Admission:  Basic Metabolic Panel: Recent Labs  Lab 12/08/19 0628  NA 125*  K 4.1  CL 89*  CO2 26  GLUCOSE 147*  BUN 11  CREATININE 0.78  CALCIUM  8.6*   Liver Function Tests: No results for input(s): AST, ALT, ALKPHOS, BILITOT, PROT, ALBUMIN in the last 168 hours. No results for input(s): LIPASE, AMYLASE in the last 168 hours. No results for input(s): AMMONIA in the last 168 hours. CBC: Recent Labs  Lab 12/08/19 0628  WBC 11.1*  HGB 7.8*  HCT 25.0*  MCV 85.9  PLT 223   Cardiac Enzymes: No results for input(s): CKTOTAL, CKMB, CKMBINDEX, TROPONINI in the last 168 hours.  BNP (last 3 results) No results for input(s): BNP in the last 8760 hours.  ProBNP (last 3 results) No results for input(s): PROBNP in the last 8760 hours.  CBG: No results for input(s): GLUCAP in the last 168 hours.  Radiological Exams on Admission: No results found.  Assessment/Plan Present on Admission: . Hyponatremia Hyponatremia -this is a new problem for her, at least since July of last year.  On examination she is euvolemic, most likely cause of her hyponatremia is her new medication Celexa.  Daughter Helen [name] says that she had hyponatremia in the past when she was placed on Zoloft. -Observation admission -Very gentle IV fluid hydration 50 cc an hour for the next 24 hours due to her history of heart failure -We will hold Lasix, Celexa -We will recheck sodium in 8 hours to make sure it is not rising too quickly  Dizziness/weakness-unclear etiology at this time, could be related to  her hyponatremia or possibly could also be a symptomatic anemia.  Will consult physical and occupational therapy while she is here in the hospital, note that she lives in an independent living facility.  She has several children who are closely involved in her care.  Anemia-a new problem for this patient, though last hemoglobin in our system was a couple of years ago.  The patient has since been started on Eliquis.  Check fecal occult blood, as well as anemia panel.  Will recheck CBC in the morning, at this point not requiring blood transfusion.  If she remains symptomatic after her sodium is corrected, she may need blood transfusion and inpatient work-up.  Otherwise, can continue work-up as an outpatient.  Combined systolic and congestive heart failure-currently with no signs of exacerbation of her heart failure, as mentioned above we will hold Lasix due to hyponatremia.  Otherwise we will continue her heart failure medications.  Anxiety/depression-the patient is being weaned off of Xanax by her PCP, will continue her on this weaning dose of 0.25 mg p.o. twice daily.  She was started on Celexa 2 weeks ago, we are holding this due to the hyponatremia.  History of TIA - continue Eliquis  HLD-Atorvastatin  GERD-Protonix  DVT prophylaxis: on Eliquis BID  Code Status: Full code, discussed with patient at the bedside in the ER this morning.  Family Communication: Discussed with her daughter Helen [name] over the phone this morning.  Disposition Plan: Likely discharge to independent living when stable.  Consults called: None  Admission status: Observation  Time spent: 45 minutes  Oasis Goehring Marry Guan MD [facility] Pager [phone]  If 7PM-7AM, please contact night-coverage [email or web address] Password [facility]  12/08/2019, 8:55 AM

## 2019-12-08 NOTE — ED Provider Notes (Signed)
Blood pressure 119/72, pulse (!) 59, temperature 97.7 F (36.5 C), resp. rate 19, height 5\' 3"  (1.6 m), weight 67.6 kg, SpO2 95 %.  Assuming care from Dr. Roxanne Mins.  In short, Helen [name] is a 86 y.o. female with a chief complaint of Dizziness and Nausea .  Refer to the original H&P for additional details.  The current plan of care is to f/u on labs.  8:16 AM Reassessed patient. Patient with sodium of 125 and Hb of 7.8. Not requiring PRBC transfusion. Will start IVF and admit with symptomatic hyponatremia +/- symptomatic anemia. No change in bowel habits.   Discussed patient's case with [facility] to request admission. Patient and family (if present) updated with plan. Care transferred to [facility] service.  I reviewed all nursing notes, vitals, pertinent old records, EKGs, labs, imaging (as available).     [name], MD 12/08/19 [phone]

## 2019-12-09 DIAGNOSIS — I739 Peripheral vascular disease, unspecified: Secondary | ICD-10-CM | POA: Diagnosis not present

## 2019-12-09 DIAGNOSIS — D509 Iron deficiency anemia, unspecified: Secondary | ICD-10-CM | POA: Diagnosis present

## 2019-12-09 DIAGNOSIS — Z7901 Long term (current) use of anticoagulants: Secondary | ICD-10-CM | POA: Diagnosis not present

## 2019-12-09 DIAGNOSIS — Z833 Family history of diabetes mellitus: Secondary | ICD-10-CM | POA: Diagnosis not present

## 2019-12-09 DIAGNOSIS — Z20822 Contact with and (suspected) exposure to covid-19: Secondary | ICD-10-CM | POA: Diagnosis present

## 2019-12-09 DIAGNOSIS — E119 Type 2 diabetes mellitus without complications: Secondary | ICD-10-CM | POA: Diagnosis not present

## 2019-12-09 DIAGNOSIS — I110 Hypertensive heart disease with heart failure: Secondary | ICD-10-CM | POA: Diagnosis present

## 2019-12-09 DIAGNOSIS — K640 First degree hemorrhoids: Secondary | ICD-10-CM | POA: Diagnosis present

## 2019-12-09 DIAGNOSIS — I428 Other cardiomyopathies: Secondary | ICD-10-CM | POA: Diagnosis present

## 2019-12-09 DIAGNOSIS — D500 Iron deficiency anemia secondary to blood loss (chronic): Secondary | ICD-10-CM | POA: Diagnosis not present

## 2019-12-09 DIAGNOSIS — D128 Benign neoplasm of rectum: Secondary | ICD-10-CM | POA: Diagnosis not present

## 2019-12-09 DIAGNOSIS — K317 Polyp of stomach and duodenum: Secondary | ICD-10-CM | POA: Diagnosis present

## 2019-12-09 DIAGNOSIS — Z853 Personal history of malignant neoplasm of breast: Secondary | ICD-10-CM | POA: Diagnosis not present

## 2019-12-09 DIAGNOSIS — K219 Gastro-esophageal reflux disease without esophagitis: Secondary | ICD-10-CM | POA: Diagnosis present

## 2019-12-09 DIAGNOSIS — D72829 Elevated white blood cell count, unspecified: Secondary | ICD-10-CM | POA: Diagnosis present

## 2019-12-09 DIAGNOSIS — K319 Disease of stomach and duodenum, unspecified: Secondary | ICD-10-CM | POA: Diagnosis not present

## 2019-12-09 DIAGNOSIS — E785 Hyperlipidemia, unspecified: Secondary | ICD-10-CM | POA: Diagnosis present

## 2019-12-09 DIAGNOSIS — R1013 Epigastric pain: Secondary | ICD-10-CM | POA: Diagnosis not present

## 2019-12-09 DIAGNOSIS — R110 Nausea: Secondary | ICD-10-CM | POA: Diagnosis not present

## 2019-12-09 DIAGNOSIS — K5730 Diverticulosis of large intestine without perforation or abscess without bleeding: Secondary | ICD-10-CM | POA: Diagnosis present

## 2019-12-09 DIAGNOSIS — I480 Paroxysmal atrial fibrillation: Secondary | ICD-10-CM | POA: Diagnosis present

## 2019-12-09 DIAGNOSIS — Z8673 Personal history of transient ischemic attack (TIA), and cerebral infarction without residual deficits: Secondary | ICD-10-CM | POA: Diagnosis not present

## 2019-12-09 DIAGNOSIS — Z90710 Acquired absence of both cervix and uterus: Secondary | ICD-10-CM | POA: Diagnosis not present

## 2019-12-09 DIAGNOSIS — E1151 Type 2 diabetes mellitus with diabetic peripheral angiopathy without gangrene: Secondary | ICD-10-CM | POA: Diagnosis present

## 2019-12-09 DIAGNOSIS — I5042 Chronic combined systolic (congestive) and diastolic (congestive) heart failure: Secondary | ICD-10-CM | POA: Diagnosis present

## 2019-12-09 DIAGNOSIS — I10 Essential (primary) hypertension: Secondary | ICD-10-CM | POA: Diagnosis not present

## 2019-12-09 DIAGNOSIS — D638 Anemia in other chronic diseases classified elsewhere: Secondary | ICD-10-CM | POA: Diagnosis present

## 2019-12-09 DIAGNOSIS — F329 Major depressive disorder, single episode, unspecified: Secondary | ICD-10-CM | POA: Diagnosis present

## 2019-12-09 DIAGNOSIS — K621 Rectal polyp: Secondary | ICD-10-CM | POA: Diagnosis present

## 2019-12-09 DIAGNOSIS — E871 Hypo-osmolality and hyponatremia: Secondary | ICD-10-CM | POA: Diagnosis present

## 2019-12-09 DIAGNOSIS — K2900 Acute gastritis without bleeding: Secondary | ICD-10-CM | POA: Diagnosis present

## 2019-12-09 DIAGNOSIS — Z66 Do not resuscitate: Secondary | ICD-10-CM | POA: Diagnosis present

## 2019-12-09 DIAGNOSIS — F419 Anxiety disorder, unspecified: Secondary | ICD-10-CM | POA: Diagnosis present

## 2019-12-09 LAB — BASIC METABOLIC PANEL
Anion gap: 9 (ref 5–15)
Anion gap: 10 (ref 5–15)
BUN: 12 mg/dL (ref 8–23)
BUN: 11 mg/dL (ref 8–23)
CO2: 24 mmol/L (ref 22–32)
CO2: 26 mmol/L (ref 22–32)
Calcium: 8.1 mg/dL — ABNORMAL LOW (ref 8.9–10.3)
Calcium: 8.4 mg/dL — ABNORMAL LOW (ref 8.9–10.3)
Chloride: 92 mmol/L — ABNORMAL LOW (ref 98–111)
Chloride: 91 mmol/L — ABNORMAL LOW (ref 98–111)
Creatinine, Ser: 0.69 mg/dL (ref 0.44–1.00)
Creatinine, Ser: 0.76 mg/dL (ref 0.44–1.00)
GFR calc Af Amer: >60 mL/min (ref >60)
GFR calc Af Amer: >60 mL/min (ref >60)
GFR calc non Af Amer: >60 mL/min (ref >60)
GFR calc non Af Amer: >60 mL/min (ref >60)
Glucose, Bld: 141 mg/dL — ABNORMAL HIGH (ref 70–99)
Glucose, Bld: 143 mg/dL — ABNORMAL HIGH (ref 70–99)
Potassium: 3.9 mmol/L (ref 3.5–5.1)
Potassium: 4.5 mmol/L (ref 3.5–5.1)
Sodium: 125 mmol/L — ABNORMAL LOW (ref 135–145)
Sodium: 127 mmol/L — ABNORMAL LOW (ref 135–145)

## 2019-12-09 LAB — CBC
HCT: 24 % — ABNORMAL LOW (ref 36.0–46.0)
Hemoglobin: 7.4 g/dL — ABNORMAL LOW (ref 12.0–15.0)
MCH: 26.3 pg (ref 26.0–34.0)
MCHC: 30.8 g/dL (ref 30.0–36.0)
MCV: 85.4 fL (ref 80.0–100.0)
Platelets: 202 10*3/uL (ref 150–400)
RBC: 2.81 MIL/uL — ABNORMAL LOW (ref 3.87–5.11)
RDW: 16.8 % — ABNORMAL HIGH (ref 11.5–15.5)
WBC: 9.6 10*3/uL (ref 4.0–10.5)
nRBC: 0 % (ref 0.0–0.2)

## 2019-12-09 LAB — SODIUM, URINE, RANDOM: Sodium, Ur: 76 mmol/L

## 2019-12-09 LAB — OSMOLALITY, URINE: Osmolality, Ur: 506 mOsm/kg (ref 300–900)

## 2019-12-09 MED ORDER — SODIUM CHLORIDE 0.9 % IV SOLN
510.0000 mg | Freq: Once | INTRAVENOUS | Status: AC
  Administered 2019-12-09: 510 mg via INTRAVENOUS
  Filled 2019-12-09: qty 510

## 2019-12-09 NOTE — Evaluation (Signed)
Occupational Therapy Evaluation Patient Details Name: Helen [name] MRN: [id number] DOB: 02-09-1930 Today's Date: 12/09/2019    History of Present Illness 86 y.o. female with medical history significant for chronic combined systolic and diastolic congestive heart failure with EF 25 to 30%, diet-controlled type 2 diabetes, history of stroke, anemia, hypertension,  hypercholesterolemia, on Eliquis being admitted to the hospital with anemia and hyponatremia.   Clinical Impression   Pt admitted with the above. Pt currently with functional limitations due to the deficits listed below (see OT Problem List).  Pt will benefit from skilled OT to increase their safety and independence with ADL and functional mobility for ADL to facilitate discharge to venue listed below.      Follow Up Recommendations  Home health OT;Supervision/Assistance - 24 hour    Equipment Recommendations  None recommended by OT    Recommendations for Other Services       Precautions / Restrictions Precautions Precautions: Fall Precaution Comments: dizzy standing at times; wears O2 at night at baseline  Restrictions Weight Bearing Restrictions: No      Mobility Bed Mobility Overal bed mobility: Needs Assistance Bed Mobility: Supine to Sit     Supine to sit: Min guard     General bed mobility comments: for safety  Transfers Overall transfer level: Needs assistance Equipment used: Rolling walker (2 wheeled) Transfers: Sit to/from Stand Sit to Stand: Min guard         General transfer comment: for safety to rise and transition to RW    Balance Overall balance assessment: Needs assistance Sitting-balance support: Feet supported;No upper extremity supported Sitting balance-Leahy Scale: Good     Standing balance support: Single extremity supported;Bilateral upper extremity supported;During functional activity Standing balance-Leahy Scale: Fair Standing balance comment: reliant on UEs for dynamic  tasks                           ADL either performed or assessed with clinical judgement   ADL Overall ADL's : Needs assistance/impaired Eating/Feeding: Set up;Sitting   Grooming: Minimal assistance;Standing   Upper Body Bathing: Minimal assistance;Sitting   Lower Body Bathing: Minimal assistance;Sit to/from stand;Cueing for safety;Cueing for sequencing   Upper Body Dressing : Minimal assistance;Sitting   Lower Body Dressing: Minimal assistance;Sit to/from stand;Cueing for safety;Cueing for sequencing   Toilet Transfer: Minimal assistance;Stand-pivot;Cueing for safety;Cueing for sequencing   Toileting- Clothing Manipulation and Hygiene: Minimal assistance;Sit to/from stand;Cueing for compensatory techniques;Cueing for safety;Cueing for sequencing       Functional mobility during ADLs: Rolling walker;Minimal assistance General ADL Comments: explained importance of getting OOB.  Pt had been in bed for day and was initially resistant to getting in chair.                  Pertinent Vitals/Pain Pain Assessment: No/denies pain     Hand Dominance     Extremity/Trunk Assessment Upper Extremity Assessment Upper Extremity Assessment: Generalized weakness           Communication Communication Communication: No difficulties   Cognition Arousal/Alertness: Awake/alert Behavior During Therapy: WFL for tasks assessed/performed Overall Cognitive Status: Within Functional Limits for tasks assessed                                                Home Living Family/patient expects to be discharged to:: Other (Comment)  Additional Comments: from [place], [place]      Prior Functioning/Environment Level of Independence: Independent;Independent with assistive device(s)        Comments: amb to dining room and laundry with rollator        OT Problem List: Decreased strength;Decreased  activity tolerance;Impaired balance (sitting and/or standing);Decreased safety awareness;Decreased knowledge of use of DME or AE      OT Treatment/Interventions: Self-care/ADL training;Patient/family education;DME and/or AE instruction    OT Goals(Current goals can be found in the care plan section) Acute Rehab OT Goals Patient Stated Goal: to get back home OT Goal Formulation: With patient Time For Goal Achievement: 12/16/19 Potential to Achieve Goals: Good  OT Frequency: Min 2X/week   Barriers to D/C: Decreased caregiver support             AM-PAC OT "6 Clicks" Daily Activity     Outcome Measure Help from another person eating meals?: None Help from another person taking care of personal grooming?: A Little Help from another person toileting, which includes using toliet, bedpan, or urinal?: A Little Help from another person bathing (including washing, rinsing, drying)?: A Little Help from another person to put on and taking off regular upper body clothing?: A Little Help from another person to put on and taking off regular lower body clothing?: A Little 6 Click Score: 19   End of Session Equipment Utilized During Treatment: Rolling walker  Activity Tolerance: Patient tolerated treatment well Patient left: in chair;with call bell/phone within reach;with family/visitor present  OT Visit Diagnosis: Unsteadiness on feet (R26.81);History of falling (Z91.81);Muscle weakness (generalized) (M62.81);Repeated falls (R29.6);Other abnormalities of gait and mobility (R26.89)                Time: [id number] OT Time Calculation (min): 23 min Charges:  OT General Charges $OT Visit: 1 Visit OT Evaluation $OT Eval Moderate Complexity: 1 Mod  Kari Baars, [place] [phone] Office- [phone], Edwena Felty D 12/09/2019, 6:02 PM

## 2019-12-09 NOTE — Progress Notes (Signed)
PROGRESS NOTE    Helen [name]  [id number] DOB: [birth year] DOA: 12/08/2019 PCP: Vernie Shanks, MD  Brief Narrative: 86 year old female with history of chronic combined systolic and diastolic CHF, EF of 123456, history of CVA, type 2 diabetes mellitus, paroxysmal atrial fibrillation on Eliquis, hypertension, dyslipidemia presented to the ED with multiple complaints. -Patient reports ongoing dizziness off and on for years, she reports worsening weakness in the last few days, along with nausea, upper abdominal tightness. -Presented to the emergency room yesterday, was noted to have a sodium of 125 and a hemoglobin of 7.8 which are considerably different from her baseline   Assessment & Plan:   Hyponatremia -Sodium was in the 130s last year, 02/2019 -Admitted with sodium of 125, clinically appears euvolemic -Lasix was held yesterday and she was given gentle fluids, sodium remains stable at 125 -Urine osmolarity and sodium ordered today, yield is low in the setting of recent diuretic use -Also check TSH -She was started on Celexa few weeks ago for depression which could also contribute to SIADH, hence this has been held -Start fluid restriction, monitor  Iron Deficiency anemia -hold eliquis -Hb down from 11.5 range, 2 years ago to 7.4 now -Give IV iron x1, check Hemoccult stool [facility] gastroenterology consulted  Chronic combined systolic and diastolic CHF -Clinically appears euvolemic, Lasix on hold -Continue carvedilol and Entresto  Anxiety, depression -Xanax resumed at lower dose, if this is being weaned reportedly per PCP  Paroxysmal atrial fibrillation -Continue carvedilol, hold Eliquis  History of TIA -Hold Eliquis  DVT prophylaxis: SCDs Code Status: Discussed CODE STATUS with the patient she wishes to be DNR Family Communication: No family at bedside, will update daughter Disposition Plan: In my medical opinion she is not stable for discharge home today, needs work-up  for hyponatremia and severe iron deficiency anemia Dispo: The patient is from: Independent living              Anticipated d/c is to: Back to independent living              Anticipated d/c date is: To be determined              Patient currently being worked up for anemia and hyponatremia        Consultants:  [facility]   Procedures:   Antimicrobials:    Subjective: -feels ok, denies any nausea or dizziness this morning  Objective: Vitals:   12/08/19 1400 12/08/19 2134 12/09/19 0003 12/09/19 0532  BP: (!) 128/58 (!) 82/58 (!) 145/65 113/66  Pulse: 74 73 88 78  Resp: 18 16 16 16   Temp: 98.6 F (37 C) 98.2 F (36.8 C) 98.6 F (37 C) 98.2 F (36.8 C)  TempSrc: Oral Oral Oral Oral  SpO2: 99% 90% 91% 96%  Weight:      Height:        Intake/Output Summary (Last 24 hours) at 12/09/2019 1137 Last data filed at 12/09/2019 0400 Gross per 24 hour  Intake 579.02 ml  Output 200 ml  Net 379.02 ml   Filed Weights   12/08/19 0643  Weight: 67.6 kg    Examination:  Gen: Pleasant elderly female, sitting up in bed, AAOx3 HEENT:  no JVD Lungs: Clear CVS: S1-S2, regular rate rhythm Abd: soft, Non tender, mildly distended, BS present Extremities: No edema Skin: no new rashes Psychiatry: Mood & affect appropriate.     Data Reviewed:   CBC: Recent Labs  Lab 12/08/19 0628 12/09/19 0540  WBC 11.1*  9.6  HGB 7.8* 7.4*  HCT 25.0* 24.0*  MCV 85.9 85.4  PLT 223 [id number]   Basic Metabolic Panel: Recent Labs  Lab 12/08/19 0628 12/08/19 1700 12/09/19 0540  NA 125* 125* 125*  K 4.1 4.4 3.9  CL 89* 90* 92*  CO2 26 27 24   GLUCOSE 147* 129* 141*  BUN 11 11 12   CREATININE 0.78 0.72 0.69  CALCIUM 8.6* 8.2* 8.1*   GFR: Estimated Creatinine Clearance: 44.9 mL/min (by C-G formula based on SCr of 0.69 mg/dL). Liver Function Tests: No results for input(s): AST, ALT, ALKPHOS, BILITOT, PROT, ALBUMIN in the last 168 hours. No results for input(s): LIPASE, AMYLASE in the last  168 hours. No results for input(s): AMMONIA in the last 168 hours. Coagulation Profile: No results for input(s): INR, PROTIME in the last 168 hours. Cardiac Enzymes: No results for input(s): CKTOTAL, CKMB, CKMBINDEX, TROPONINI in the last 168 hours. BNP (last 3 results) No results for input(s): PROBNP in the last 8760 hours. HbA1C: No results for input(s): HGBA1C in the last 72 hours. CBG: No results for input(s): GLUCAP in the last 168 hours. Lipid Profile: No results for input(s): CHOL, HDL, LDLCALC, TRIG, CHOLHDL, LDLDIRECT in the last 72 hours. Thyroid Function Tests: No results for input(s): TSH, T4TOTAL, FREET4, T3FREE, THYROIDAB in the last 72 hours. Anemia Panel: Recent Labs    12/08/19 0816  VITAMINB12 338  FOLATE 20.2  FERRITIN 11  TIBC 451*  IRON 33  RETICCTPCT 3.4*   Urine analysis:    Component Value Date/Time   COLORURINE YELLOW 11/12/2016 0105   APPEARANCEUR Clear 03/12/2017 1440   LABSPEC 1.015 11/12/2016 0105   PHURINE 5.0 11/12/2016 0105   GLUCOSEU Negative 03/12/2017 1440   HGBUR NEGATIVE 11/12/2016 0105   BILIRUBINUR Negative 03/12/2017 1440   KETONESUR NEGATIVE 11/12/2016 0105   PROTEINUR Negative 03/12/2017 1440   PROTEINUR 100 (A) 11/12/2016 0105   NITRITE Negative 03/12/2017 1440   NITRITE NEGATIVE 11/12/2016 0105   LEUKOCYTESUR 2+ (A) 03/12/2017 1440   Sepsis Labs: @LABRCNTIP (procalcitonin:4,lacticidven:4)  ) Recent Results (from the past 240 hour(s))  SARS CORONAVIRUS 2 (TAT 6-24 HRS) Nasopharyngeal Nasopharyngeal Swab     Status: None   Collection Time: 12/08/19  9:31 AM   Specimen: Nasopharyngeal Swab  Result Value Ref Range Status   SARS Coronavirus 2 NEGATIVE NEGATIVE Final    Comment: (NOTE) SARS-CoV-2 target nucleic acids are NOT DETECTED. The SARS-CoV-2 RNA is generally detectable in upper and lower respiratory specimens during the acute phase of infection. Negative results do not preclude SARS-CoV-2 infection, do not rule  out co-infections with other pathogens, and should not be used as the sole basis for treatment or other patient management decisions. Negative results must be combined with clinical observations, patient history, and epidemiological information. The expected result is Negative. Fact Sheet for Patients: SugarRoll.be Fact Sheet for Healthcare Providers: https://www.woods-mathews.com/ This test is not yet approved or cleared by the [place] FDA and  has been authorized for detection and/or diagnosis of SARS-CoV-2 by FDA under an Emergency Use Authorization (EUA). This EUA will remain  in effect (meaning this test can be used) for the duration of the COVID-19 declaration under Section 56 4(b)(1) of the Act, 21 U.S.C. section 360bbb-3(b)(1), unless the authorization is terminated or revoked sooner. Performed at [facility], [place] [address], [place], [place] [address]          Radiology Studies: No results found.      Scheduled Meds: . apixaban  5 mg Oral BID  . atorvastatin  80 mg Oral QHS  . carvedilol  25 mg Oral BID WC  . pantoprazole  40 mg Oral Daily  . sacubitril-valsartan  1 tablet Oral BID   Continuous Infusions:   LOS: 0 days    Time spent: 100min  Domenic Polite, MD [facility]  12/09/2019, 11:37 AM

## 2019-12-09 NOTE — Consult Note (Signed)
Referring Provider: Dr. Broadus John Primary Care Physician:  Vernie Shanks, MD Primary Gastroenterologist:  Dr. Cristina Gong  Reason for Consultation:  Anemia  HPI: Helen [name] is a 86 y.o. female admitted for weakness, epigastric pain, and nausea and found to be hyponatremic with a Na of 125 and Hgb 7.8 (Hgb 11.4 in 02/2018). Hgb 7.4 today. Ferritin 11, iron sat 14%. Recently she has been having an intermittent "gnawing" feeling in her epigastric area without N/V/melena/hematochezia/heartburn/dysphagia. She is on Eliquis for Afib and last received a dose yesterday. EGD in 2015 showed short segment Barrett's esophagus and fundic gland polyps. Colonoscopy in 04/2012 where 4 diminutive tubular adenomas were removed and showed sigmoid diverticulosis.  Past Medical History:  Diagnosis Date  . Anemia   . Cancer [facility])    Breast cancer  . Chronic combined systolic (congestive) and diastolic (congestive) heart failure (HCC)    a. EF 25-30% by echo in 11/2016. Recent NST showing no inducible ischemia --> therefore thought to be most consistent with nonischemic cardiomyopathy.   . Diabetes mellitus   . History of stroke 11/2016   incidental finding on MRI  . Hypercholesteremia   . Hypertension   . Peripheral vascular disease ([place]) 2015   bilateral moderate CA disease-Dr Bridgett Larsson    Past Surgical History:  Procedure Laterality Date  . ABDOMINAL HYSTERECTOMY    . APPENDECTOMY  1951  . [place]  . CHOLECYSTECTOMY  2003   By Dr. Excell Seltzer  . EYE SURGERY Bilateral 2009   Cataract surgery  . FRACTURE SURGERY Right    ORIF   by Dr. Burney Gauze  . HUMERUS FRACTURE SURGERY    . MASTECTOMY Bilateral   . NASAL SEPTUM SURGERY  1977   Dr. Ernesto Rutherford  . TOENAIL EXCISION      Prior to Admission medications   Medication Sig Start Date End Date Taking? Authorizing Provider  ALPRAZolam Duanne Moron) 0.5 MG tablet Take 0.25 mg by mouth in the morning and at bedtime.  12/04/19  Yes [provider]  apixaban (ELIQUIS) 5 MG TABS tablet Take 1 tablet (5 mg total) by mouth 2 (two) times daily. 08/14/19  Yes Skeet Latch, MD  atorvastatin (LIPITOR) 80 MG tablet Take 80 mg by mouth at bedtime.   Yes [provider]  BIOTIN PO Take 1 tablet by mouth daily.   Yes [provider]  bismuth subsalicylate (PEPTO BISMOL) 262 MG chewable tablet Chew 524 mg by mouth as needed for indigestion or diarrhea or loose stools.   Yes [provider]  carvedilol (COREG) 25 MG tablet Take 1 tablet (25 mg total) by mouth 2 (two) times daily with a meal. 10/18/19  Yes Skeet Latch, MD  cholecalciferol (VITAMIN D) 1000 units tablet Take 1,000 Units by mouth every evening.    Yes [provider]  citalopram (CELEXA) 20 MG tablet Take 20 mg by mouth daily. 12/01/19  Yes [provider]  ENTRESTO 49-51 MG TAKE 1 TABLET BY MOUTH  TWICE DAILY 03/30/19  Yes Skeet Latch, MD  furosemide (LASIX) 20 MG tablet TAKE 1 TABLET BY MOUTH  DAILY 06/20/19  Yes Skeet Latch, MD  magnesium oxide (MAG-OX) 400 MG tablet 1 tablet by mouth twice a day for 1 week and then 1 daily Patient taking differently: Take 400 mg by mouth daily.  03/24/18  Yes Skeet Latch, MD  Multiple Vitamin (MULTIVITAMIN WITH MINERALS) TABS tablet Take 1 tablet by mouth daily.    Yes [provider]  pantoprazole (PROTONIX) 40 MG tablet Take 40 mg by mouth daily.  05/27/18  Yes [provider]  vitamin C (ASCORBIC ACID) 500 MG tablet Take 500 mg by mouth daily.    Yes [provider]  meclizine (ANTIVERT) 25 MG tablet TAKE ONE TABLET BY MOUTH THREE TIMES A DAY AS NEEDED FOR DIZZINESS Patient not taking: Reported on 12/08/2019 03/09/18   Skeet Latch, MD    Scheduled Meds: . atorvastatin  80 mg Oral QHS  . carvedilol  25 mg Oral BID WC  . pantoprazole  40 mg Oral Daily  . sacubitril-valsartan  1 tablet Oral BID   Continuous Infusions: PRN Meds:.acetaminophen **OR**  acetaminophen, ALPRAZolam, ondansetron **OR** ondansetron (ZOFRAN) IV  Allergies as of 12/08/2019 - Review Complete 12/08/2019  Allergen Reaction Noted  . Morphine and related Nausea And Vomiting 10/03/2011  . Other Other (See Comments) 10/22/2015  . Sulfa antibiotics Hives 10/03/2011  . Sulfasalazine Hives 10/03/2011  . Nickel Rash 12/13/2015    Family History  Problem Relation Age of Onset  . Cancer Mother   . Pulmonary embolism Mother   . Heart disease Father   . Heart attack Father   . Stroke Father   . Diabetes Brother   . Hyperlipidemia Brother   . Stroke Brother   . Kidney disease Brother     Social History   Socioeconomic History  . Marital status: Widowed    Spouse name: Not on file  . Number of children: Not on file  . Years of education: Not on file  . Highest education level: Not on file  Occupational History  . Not on file  Tobacco Use  . Smoking status: Former Smoker    Quit date: 08/24/1978    Years since quitting: 41.3  . Smokeless tobacco: Never Used  Substance and Sexual Activity  . Alcohol use: No  . Drug use: No  . Sexual activity: Not on file  Other Topics Concern  . Not on file  Social History Narrative  . Not on file   Social Determinants of Health   Financial Resource Strain:   . Difficulty of Paying Living Expenses:   Food Insecurity:   . Worried About Charity fundraiser in the Last Year:   . Arboriculturist in the Last Year:   Transportation Needs:   . Film/video editor (Medical):   [name] Lack of Transportation (Non-Medical):   Physical Activity:   . Days of Exercise per Week:   . Minutes of Exercise per Session:   Stress:   . Feeling of Stress :   Social Connections:   . Frequency of Communication with Friends and Family:   . Frequency of Social Gatherings with Friends and Family:   . Attends Religious Services:   . Active Member of Clubs or Organizations:   . Attends Archivist Meetings:   [name] Marital Status:    Intimate Partner Violence:   . Fear of Current or Ex-Partner:   . Emotionally Abused:   [name] Physically Abused:   . Sexually Abused:     Review of Systems: All negative except as stated above in HPI.  Physical Exam: Vital signs: Vitals:   12/09/19 1408 12/09/19 1410  BP: 112/70 132/62  Pulse: 69 71  Resp: 18 20  Temp:    SpO2: 100% 99%  T 97.7  Last BM Date: 12/07/19 General:  Elderly, well-nourished, pleasant and cooperative in NAD Head: normocephalic, atraumatic Eyes: anicteric sclera ENT: oropharynx clear Neck:  supple, nontender Lungs:  Clear throughout to auscultation.   No wheezes, crackles, or rhonchi. No acute distress. Heart:  Regular rate and rhythm; no murmurs, clicks, rubs,  or gallops. Abdomen: soft, nontender, nondistended, +BS  Rectal:  Deferred Ext: no edema  GI:  Lab Results: Recent Labs    12/08/19 0628 12/09/19 0540  WBC 11.1* 9.6  HGB 7.8* 7.4*  HCT 25.0* 24.0*  PLT 223 202   BMET Recent Labs    12/08/19 0628 12/08/19 1700 12/09/19 0540  NA 125* 125* 125*  K 4.1 4.4 3.9  CL 89* 90* 92*  CO2 26 27 24   GLUCOSE 147* 129* 141*  BUN 11 11 12   CREATININE 0.78 0.72 0.69  CALCIUM 8.6* 8.2* 8.1*   LFT No results for input(s): PROT, ALBUMIN, AST, ALT, ALKPHOS, BILITOT, BILIDIR, IBILI in the last 72 hours. PT/INR No results for input(s): LABPROT, INR in the last 72 hours.   Studies/Results: No results found.  Impression/Plan: Iron deficiency anemia in the setting of Eliquis with epigastric pain. Hyponatremia being managed by primary team. No overt bleeding. Would manage conservatively. Due to epigastric pain, consider EGD prior to discharge once sodium has been corrected. Will hold off on repeat colonoscopy unless anemia worsens. Continue to hold Eliquis for now. Will f/u.    LOS: 0 days   Lear Ng  12/09/2019, 4:28 PM  Questions please call [phone]

## 2019-12-09 NOTE — Progress Notes (Signed)
Physical Therapy Treatment Patient Details Name: Helen [name] MRN: [id number] DOB: 1930-04-14 Today's Date: 12/09/2019    History of Present Illness 86 y.o. female with medical history significant for chronic combined systolic and diastolic congestive heart failure with EF 25 to 30%, diet-controlled type 2 diabetes, history of stroke, anemia, hypertension,  hypercholesterolemia, on Eliquis being admitted to the hospital with anemia and hyponatremia.    PT Comments    Pt progressing, incr amb distance today. Some dizziness on standing, VSS. Continue to recommend HHPT at d/c    Follow Up Recommendations  Home health PT     Equipment Recommendations  None recommended by PT    Recommendations for Other Services       Precautions / Restrictions Precautions Precautions: Fall Precaution Comments: dizzy standing at times; wears O2 at night at baseline  Restrictions Weight Bearing Restrictions: No    Mobility  Bed Mobility               General bed mobility comments: in chair on arrival  Transfers Overall transfer level: Needs assistance Equipment used: Rolling walker (2 wheeled) Transfers: Sit to/from Stand Sit to Stand: Min guard;Supervision         General transfer comment: for safety to rise and transition to RW; dizzy on standing. returned to sit,  BP 134/51, Sats 90-91% on RA  Ambulation/Gait Ambulation/Gait assistance: Min guard Gait Distance (Feet): 70 Feet Assistive device: Rolling walker (2 wheeled) Gait Pattern/deviations: Step-through pattern;Decreased stride length     General Gait Details: cues for RW safety   Stairs             Wheelchair Mobility    Modified Rankin (Stroke Patients Only)       Balance Overall balance assessment: Needs assistance Sitting-balance support: Feet supported;No upper extremity supported Sitting balance-Leahy Scale: Good     Standing balance support: Single extremity supported;Bilateral upper  extremity supported;During functional activity Standing balance-Leahy Scale: Fair Standing balance comment: reliant on UEs for dynamic tasks                            Cognition Arousal/Alertness: Awake/alert Behavior During Therapy: WFL for tasks assessed/performed Overall Cognitive Status: Within Functional Limits for tasks assessed                                        Exercises      General Comments        Pertinent Vitals/Pain Pain Assessment: No/denies pain    Home Living                      Prior Function            PT Goals (current goals can now be found in the care plan section) Acute Rehab PT Goals Patient Stated Goal: to get back home PT Goal Formulation: With patient Time For Goal Achievement: 12/15/19 Potential to Achieve Goals: Good Progress towards PT goals: Progressing toward goals    Frequency    Min 3X/week      PT Plan Current plan remains appropriate    Co-evaluation              AM-PAC PT "6 Clicks" Mobility   Outcome Measure  Help needed turning from your back to your side while in a flat bed without using bedrails?: None Help  needed moving from lying on your back to sitting on the side of a flat bed without using bedrails?: None Help needed moving to and from a bed to a chair (including a wheelchair)?: A Little Help needed standing up from a chair using your arms (e.g., wheelchair or bedside chair)?: A Little Help needed to walk in hospital room?: A Little Help needed climbing 3-5 steps with a railing? : A Little 6 Click Score: 20    End of Session Equipment Utilized During Treatment: Gait belt Activity Tolerance: Patient tolerated treatment well Patient left: in chair;with call bell/phone within reach;Other (comment)(MD present, no alarm on arrival )   PT Visit Diagnosis: Difficulty in walking, not elsewhere classified (R26.2);Unsteadiness on feet (R26.81)     Time: [id number] PT  Time Calculation (min) (ACUTE ONLY): 15 min  Charges:  $Gait Training: 8-22 mins                     Baxter Flattery, PT   Acute Rehab Dept [facility]): [id number]   12/09/2019    [facility] 12/09/2019, 3:46 PM

## 2019-12-10 LAB — BASIC METABOLIC PANEL
Anion gap: 6 (ref 5–15)
Anion gap: 8 (ref 5–15)
BUN: 10 mg/dL (ref 8–23)
BUN: 10 mg/dL (ref 8–23)
CO2: 31 mmol/L (ref 22–32)
CO2: 28 mmol/L (ref 22–32)
Calcium: 8.5 mg/dL — ABNORMAL LOW (ref 8.9–10.3)
Calcium: 8.1 mg/dL — ABNORMAL LOW (ref 8.9–10.3)
Chloride: 90 mmol/L — ABNORMAL LOW (ref 98–111)
Chloride: 91 mmol/L — ABNORMAL LOW (ref 98–111)
Creatinine, Ser: 0.83 mg/dL (ref 0.44–1.00)
Creatinine, Ser: 0.71 mg/dL (ref 0.44–1.00)
GFR calc Af Amer: >60 mL/min (ref >60)
GFR calc Af Amer: >60 mL/min (ref >60)
GFR calc non Af Amer: >60 mL/min (ref >60)
GFR calc non Af Amer: >60 mL/min (ref >60)
Glucose, Bld: 116 mg/dL — ABNORMAL HIGH (ref 70–99)
Glucose, Bld: 140 mg/dL — ABNORMAL HIGH (ref 70–99)
Potassium: 4 mmol/L (ref 3.5–5.1)
Potassium: 4.2 mmol/L (ref 3.5–5.1)
Sodium: 127 mmol/L — ABNORMAL LOW (ref 135–145)
Sodium: 127 mmol/L — ABNORMAL LOW (ref 135–145)

## 2019-12-10 LAB — PREPARE RBC (CROSSMATCH)

## 2019-12-10 LAB — CBC
HCT: 23.1 % — ABNORMAL LOW (ref 36.0–46.0)
Hemoglobin: 7.1 g/dL — ABNORMAL LOW (ref 12.0–15.0)
MCH: 26.6 pg (ref 26.0–34.0)
MCHC: 30.7 g/dL (ref 30.0–36.0)
MCV: 86.5 fL (ref 80.0–100.0)
Platelets: 199 10*3/uL (ref 150–400)
RBC: 2.67 MIL/uL — ABNORMAL LOW (ref 3.87–5.11)
RDW: 17 % — ABNORMAL HIGH (ref 11.5–15.5)
WBC: 9.3 10*3/uL (ref 4.0–10.5)
nRBC: 0 % (ref 0.0–0.2)

## 2019-12-10 LAB — TSH: TSH: 3.088 u[IU]/mL (ref 0.350–4.500)

## 2019-12-10 MED ORDER — FUROSEMIDE 10 MG/ML IJ SOLN
20.0000 mg | Freq: Once | INTRAMUSCULAR | Status: AC
  Administered 2019-12-10: 23:00:00 20 mg via INTRAVENOUS
  Filled 2019-12-10: qty 2

## 2019-12-10 MED ORDER — HYDROCODONE-ACETAMINOPHEN 5-325 MG PO TABS
1.0000 | ORAL_TABLET | Freq: Four times a day (QID) | ORAL | Status: DC | PRN
  Administered 2019-12-10: 21:00:00 1 via ORAL
  Filled 2019-12-10: qty 1

## 2019-12-10 MED ORDER — SODIUM CHLORIDE 0.9% IV SOLUTION: Freq: Once | INTRAVENOUS | Status: DC

## 2019-12-10 NOTE — Progress Notes (Signed)
Physical Therapy Treatment Patient Details Name: Helen [name] MRN: [id number] DOB: Feb 05, 1930 Today's Date: 12/10/2019    History of Present Illness 86 y.o. female with medical history significant for chronic combined systolic and diastolic congestive heart failure with EF 25 to 30%, diet-controlled type 2 diabetes, history of stroke, anemia, hypertension,  hypercholesterolemia, on Eliquis being admitted to the hospital with anemia and hyponatremia.    PT Comments    Pt not feeling well today. Sat EOB and drank some tea. Pt reports she is not feeling  well today, fatigued/dizzy  and nauseous. Hgb down to 7.1 today.  Lunch arrived however, pt stated she did not feel like eating anything, RN giving zofran on PT departure. Will continue to follow in acute setting   Follow Up Recommendations  Home health PT     Equipment Recommendations  None recommended by PT    Recommendations for Other Services       Precautions / Restrictions Precautions Precautions: Fall Precaution Comments: dizzy standing at times; wears O2 at night at baseline  Restrictions Weight Bearing Restrictions: No    Mobility  Bed Mobility Overal bed mobility: Needs Assistance Bed Mobility: Supine to Sit;Sit to Supine     Supine to sit: Supervision Sit to supine: Supervision   General bed mobility comments: for safety   Transfers                 General transfer comment: deferred d/t pt not feeling well today. c/o weakness/dizziness and nausea   Ambulation/Gait                 Stairs             Wheelchair Mobility    Modified Rankin (Stroke Patients Only)       Balance     Sitting balance-Leahy Scale: Good                                      Cognition Arousal/Alertness: Awake/alert Behavior During Therapy: WFL for tasks assessed/performed Overall Cognitive Status: Within Functional Limits for tasks assessed                                        Exercises      General Comments        Pertinent Vitals/Pain Pain Assessment: No/denies pain    Home Living                      Prior Function            PT Goals (current goals can now be found in the care plan section) Acute Rehab PT Goals Patient Stated Goal: to get back home PT Goal Formulation: With patient Time For Goal Achievement: 12/15/19 Potential to Achieve Goals: Good Progress towards PT goals: Progressing toward goals    Frequency    Min 3X/week      PT Plan Current plan remains appropriate    Co-evaluation              AM-PAC PT "6 Clicks" Mobility   Outcome Measure  Help needed turning from your back to your side while in a flat bed without using bedrails?: None Help needed moving from lying on your back to sitting on the side of a flat bed without using  bedrails?: None Help needed moving to and from a bed to a chair (including a wheelchair)?: A Little Help needed standing up from a chair using your arms (e.g., wheelchair or bedside chair)?: A Little Help needed to walk in hospital room?: A Little Help needed climbing 3-5 steps with a railing? : A Little 6 Click Score: 20    End of Session Equipment Utilized During Treatment: Gait belt Activity Tolerance: Patient tolerated treatment well Patient left: with call bell/phone within reach;in bed;with bed alarm set;with nursing/sitter in room Nurse Communication: Mobility status PT Visit Diagnosis: Difficulty in walking, not elsewhere classified (R26.2);Unsteadiness on feet (R26.81)     Time: 1206-1222 PT Time Calculation (min) (ACUTE ONLY): 16 min  Charges:  $Therapeutic Activity: 8-22 mins                     Baxter Flattery, PT   Acute Rehab Dept [facility]): [id number]   12/10/2019    [facility] 12/10/2019, 12:32 PM

## 2019-12-10 NOTE — Progress Notes (Signed)
PROGRESS NOTE    Helen [name]  [id number] DOB: 30-Apr-1930 DOA: 12/08/2019 PCP: Vernie Shanks, MD  Brief Narrative: 86 year old female with history of chronic combined systolic and diastolic CHF, EF of 123456, history of CVA, type 2 diabetes mellitus, paroxysmal atrial fibrillation on Eliquis, hypertension, dyslipidemia presented to the ED with multiple complaints. -Patient reports ongoing dizziness off and on for years, she reports worsening weakness in the last few days, along with nausea, upper abdominal tightness. -Presented to the emergency room yesterday, was noted to have a sodium of 125 and a hemoglobin of 7.8 which are considerably different from her baseline  Assessment & Plan:   Hyponatremia -Sodium was in the 130s last year, 02/2019 -Admitted with sodium of 125, clinically appears euvolemic, now 127 -Lasix was held on admission and she was given gentle fluids -Urine sodium and osmolarity is high however its yield is low in the setting of recent diuretic use -Also check TSH -She was started on Celexa few weeks ago for depression which could also contribute to SIADH, hence this has been held -Improving, continue fluid restriction and monitor  Iron Deficiency anemia -Eliquis on hold -Hb down from 11.5 range, 2 years ago down  to 7.1 now -Given IV iron x1 on 4/17 -Transfuse 1 unit of PRBC -[facility] consulted, given need for anticoagulation would benefit from endoscopy/colonoscopy  Chronic combined systolic and diastolic CHF -Clinically appears euvolemic, Lasix on hold -Continue carvedilol and Entresto  Anxiety, depression -Xanax resumed at lower dose, if this is being weaned reportedly per PCP  Paroxysmal atrial fibrillation -Continue carvedilol, hold Eliquis  History of TIA -Hold Eliquis  DVT prophylaxis: SCDs Code Status: Discussed CODE STATUS with the patient she wishes to be DNR Family Communication: Updated patient's son who is at  bedside Disposition Plan: In my medical opinion she is not stable for discharge home today, needs work-up for hyponatremia and severe iron deficiency anemia Dispo: The patient is from: Independent living              Anticipated d/c is to: Back to independent living              Anticipated d/c date is: To be determined              Patient currently being worked up for anemia and hyponatremia  Consultants:  [facility]   Procedures:   Antimicrobials:    Subjective: -Feels better today, denies any complaints, denies any nausea or shortness of breath this morning  Objective: Vitals:   12/10/19 0548 12/10/19 1312 12/10/19 1354 12/10/19 1355  BP: (!) 136/45 (!) 99/48  98/64  Pulse: 79 65  61  Resp:  16  18  Temp:  98.2 F (36.8 C) 98.2 F (36.8 C)   TempSrc:  Oral Oral   SpO2: 91% 100%  99%  Weight:      Height:        Intake/Output Summary (Last 24 hours) at 12/10/2019 1451 Last data filed at 12/10/2019 0900 Gross per 24 hour  Intake 359.5 ml  Output --  Net 359.5 ml   Filed Weights   12/08/19 0643  Weight: 67.6 kg    Examination: Gen: Pleasant elderly female, sitting up in bed, AAOx3, HEENT: no JVD Lungs: Clear bilaterally CVS: S1-S2, regular rate Abd: soft, Non tender, non distended, BS present Extremities: No edema Skin: no new rashes Psychiatry: Mood & affect appropriate.     Data Reviewed:   CBC: Recent Labs  Lab 12/08/19  [id number] 12/09/19 0540 12/10/19 0607  WBC 11.1* 9.6 9.3  HGB 7.8* 7.4* 7.1*  HCT 25.0* 24.0* 23.1*  MCV 85.9 85.4 86.5  PLT 223 202 [id number]   Basic Metabolic Panel: Recent Labs  Lab 12/08/19 0628 12/08/19 1700 12/09/19 0540 12/09/19 1525 12/10/19 0607  NA 125* 125* 125* 127* 127*  K 4.1 4.4 3.9 4.5 4.0  CL 89* 90* 92* 91* 90*  CO2 26 27 24 26 31   GLUCOSE 147* 129* 141* 143* 116*  BUN 11 11 12 11 10   CREATININE 0.78 0.72 0.69 0.76 0.83  CALCIUM 8.6* 8.2* 8.1* 8.4* 8.5*   GFR: Estimated Creatinine Clearance: 43.3 mL/min  (by C-G formula based on SCr of 0.83 mg/dL). Liver Function Tests: No results for input(s): AST, ALT, ALKPHOS, BILITOT, PROT, ALBUMIN in the last 168 hours. No results for input(s): LIPASE, AMYLASE in the last 168 hours. No results for input(s): AMMONIA in the last 168 hours. Coagulation Profile: No results for input(s): INR, PROTIME in the last 168 hours. Cardiac Enzymes: No results for input(s): CKTOTAL, CKMB, CKMBINDEX, TROPONINI in the last 168 hours. BNP (last 3 results) No results for input(s): PROBNP in the last 8760 hours. HbA1C: No results for input(s): HGBA1C in the last 72 hours. CBG: No results for input(s): GLUCAP in the last 168 hours. Lipid Profile: No results for input(s): CHOL, HDL, LDLCALC, TRIG, CHOLHDL, LDLDIRECT in the last 72 hours. Thyroid Function Tests: No results for input(s): TSH, T4TOTAL, FREET4, T3FREE, THYROIDAB in the last 72 hours. Anemia Panel: Recent Labs    12/08/19 0816  VITAMINB12 338  FOLATE 20.2  FERRITIN 11  TIBC 451*  IRON 33  RETICCTPCT 3.4*   Urine analysis:    Component Value Date/Time   COLORURINE YELLOW 11/12/2016 0105   APPEARANCEUR Clear 03/12/2017 1440   LABSPEC 1.015 11/12/2016 0105   PHURINE 5.0 11/12/2016 0105   GLUCOSEU Negative 03/12/2017 1440   HGBUR NEGATIVE 11/12/2016 0105   BILIRUBINUR Negative 03/12/2017 1440   KETONESUR NEGATIVE 11/12/2016 0105   PROTEINUR Negative 03/12/2017 1440   PROTEINUR 100 (A) 11/12/2016 0105   NITRITE Negative 03/12/2017 1440   NITRITE NEGATIVE 11/12/2016 0105   LEUKOCYTESUR 2+ (A) 03/12/2017 1440   Sepsis Labs: @LABRCNTIP (procalcitonin:4,lacticidven:4)  ) Recent Results (from the past 240 hour(s))  SARS CORONAVIRUS 2 (TAT 6-24 HRS) Nasopharyngeal Nasopharyngeal Swab     Status: None   Collection Time: 12/08/19  9:31 AM   Specimen: Nasopharyngeal Swab  Result Value Ref Range Status   SARS Coronavirus 2 NEGATIVE NEGATIVE Final    Comment: (NOTE) SARS-CoV-2 target nucleic acids  are NOT DETECTED. The SARS-CoV-2 RNA is generally detectable in upper and lower respiratory specimens during the acute phase of infection. Negative results do not preclude SARS-CoV-2 infection, do not rule out co-infections with other pathogens, and should not be used as the sole basis for treatment or other patient management decisions. Negative results must be combined with clinical observations, patient history, and epidemiological information. The expected result is Negative. Fact Sheet for Patients: SugarRoll.be Fact Sheet for Healthcare Providers: https://www.woods-mathews.com/ This test is not yet approved or cleared by the [place] FDA and  has been authorized for detection and/or diagnosis of SARS-CoV-2 by FDA under an Emergency Use Authorization (EUA). This EUA will remain  in effect (meaning this test can be used) for the duration of the COVID-19 declaration under Section 56 4(b)(1) of the Act, 21 U.S.C. section 360bbb-3(b)(1), unless the authorization is terminated or revoked sooner. Performed at [facility]  Hospital Lab, [place] [address] [place], [place] [address]      Radiology Studies: No results found.  Scheduled Meds: . sodium chloride   Intravenous Once  . atorvastatin  80 mg Oral QHS  . carvedilol  25 mg Oral BID WC  . furosemide  20 mg Intravenous Once  . pantoprazole  40 mg Oral Daily  . sacubitril-valsartan  1 tablet Oral BID   Continuous Infusions:   LOS: 1 day   Time spent: 56min  Domenic Polite, MD [facility]  12/10/2019, 2:51 PM

## 2019-12-11 LAB — BASIC METABOLIC PANEL
Anion gap: 9 (ref 5–15)
Anion gap: 9 (ref 5–15)
BUN: 9 mg/dL (ref 8–23)
BUN: 10 mg/dL (ref 8–23)
CO2: 27 mmol/L (ref 22–32)
CO2: 28 mmol/L (ref 22–32)
Calcium: 8.3 mg/dL — ABNORMAL LOW (ref 8.9–10.3)
Calcium: 8.3 mg/dL — ABNORMAL LOW (ref 8.9–10.3)
Chloride: 91 mmol/L — ABNORMAL LOW (ref 98–111)
Chloride: 89 mmol/L — ABNORMAL LOW (ref 98–111)
Creatinine, Ser: 0.74 mg/dL (ref 0.44–1.00)
Creatinine, Ser: 0.77 mg/dL (ref 0.44–1.00)
GFR calc Af Amer: >60 mL/min (ref >60)
GFR calc Af Amer: >60 mL/min (ref >60)
GFR calc non Af Amer: >60 mL/min (ref >60)
GFR calc non Af Amer: >60 mL/min (ref >60)
Glucose, Bld: 106 mg/dL — ABNORMAL HIGH (ref 70–99)
Glucose, Bld: 138 mg/dL — ABNORMAL HIGH (ref 70–99)
Potassium: 3.7 mmol/L (ref 3.5–5.1)
Potassium: 3.7 mmol/L (ref 3.5–5.1)
Sodium: 127 mmol/L — ABNORMAL LOW (ref 135–145)
Sodium: 126 mmol/L — ABNORMAL LOW (ref 135–145)

## 2019-12-11 LAB — ABO/RH: ABO/RH(D): O POS

## 2019-12-11 LAB — TYPE AND SCREEN
ABO/RH(D): O POS
Antibody Screen: NEGATIVE
Unit division: 0

## 2019-12-11 LAB — CBC
HCT: 26.6 % — ABNORMAL LOW (ref 36.0–46.0)
Hemoglobin: 8.3 g/dL — ABNORMAL LOW (ref 12.0–15.0)
MCH: 26.7 pg (ref 26.0–34.0)
MCHC: 31.2 g/dL (ref 30.0–36.0)
MCV: 85.5 fL (ref 80.0–100.0)
Platelets: 185 10*3/uL (ref 150–400)
RBC: 3.11 MIL/uL — ABNORMAL LOW (ref 3.87–5.11)
RDW: 16.3 % — ABNORMAL HIGH (ref 11.5–15.5)
WBC: 8.2 10*3/uL (ref 4.0–10.5)
nRBC: 0.2 % (ref 0.0–0.2)

## 2019-12-11 LAB — BPAM RBC
Blood Product Expiration Date: 202105192359
ISSUE DATE / TIME: 202104181830
Unit Type and Rh: 5100

## 2019-12-11 MED ORDER — PEG 3350-KCL-NA BICARB-NACL 420 G PO SOLR
4000.0000 mL | Freq: Once | ORAL | Status: DC
  Filled 2019-12-11: qty 4000

## 2019-12-11 MED ORDER — FUROSEMIDE 20 MG PO TABS
20.0000 mg | ORAL_TABLET | Freq: Once | ORAL | Status: AC
  Administered 2019-12-11: 20 mg via ORAL
  Filled 2019-12-11: qty 1

## 2019-12-11 MED ORDER — PEG 3350-KCL-NA BICARB-NACL 420 G PO SOLR
4000.0000 mL | Freq: Once | ORAL | Status: AC
  Administered 2019-12-11: 17:00:00 4000 mL via ORAL
  Filled 2019-12-11: qty 4000

## 2019-12-11 NOTE — Progress Notes (Signed)
Occupational Therapy Treatment Patient Details Name: Helen [name] MRN: [id number] DOB: [birth year] Today's Date: 12/11/2019    History of present illness 86 y.o. female with medical history significant for chronic combined systolic and diastolic congestive heart failure with EF 25 to 30%, diet-controlled type 2 diabetes, history of stroke, anemia, hypertension,  hypercholesterolemia, on Eliquis being admitted to the hospital with anemia and hyponatremia.      Follow Up Recommendations  Home health OT;Supervision/Assistance - 24 hour    Equipment Recommendations  None recommended by OT    Recommendations for Other Services      Precautions / Restrictions Precautions Precautions: Fall Precaution Comments: dizzy standing at times; wears O2 at night at baseline  Restrictions Weight Bearing Restrictions: No       Mobility Bed Mobility Overal bed mobility: Needs Assistance Bed Mobility: Supine to Sit;Sit to Supine     Supine to sit: Supervision Sit to supine: Supervision   General bed mobility comments: for safety   Transfers Overall transfer level: Needs assistance Equipment used: Rolling walker (2 wheeled) Transfers: Sit to/from [facility] Sit to Stand: Min assist Stand pivot transfers: Min assist            Balance Overall balance assessment: Needs assistance Sitting-balance support: Feet supported;No upper extremity supported Sitting balance-Leahy Scale: Good     Standing balance support: Single extremity supported;Bilateral upper extremity supported;During functional activity Standing balance-Leahy Scale: Fair Standing balance comment: reliant on UEs for dynamic tasks                           ADL either performed or assessed with clinical judgement   ADL Overall ADL's : Needs assistance/impaired     Grooming: Minimal assistance;Standing;Min guard Grooming Details (indicate cue type and reason): washing hair with shampoo  cap                 Toilet Transfer: Minimal assistance;Stand-pivot;Cueing for safety;Cueing for sequencing   Toileting- Clothing Manipulation and Hygiene: Minimal assistance;Sit to/from stand;Cueing for compensatory techniques;Cueing for safety;Cueing for sequencing       Functional mobility during ADLs: Rolling walker;Minimal assistance General ADL Comments: encourged pt to use BUE to scrub hair with no rinse shampoo cap     Vision Patient Visual Report: No change from baseline            Cognition Arousal/Alertness: Awake/alert Behavior During Therapy: WFL for tasks assessed/performed Overall Cognitive Status: Within Functional Limits for tasks assessed                                                     Pertinent Vitals/ Pain       Pain Assessment: No/denies pain         Frequency  Min 2X/week        Progress Toward Goals  OT Goals(current goals can now be found in the care plan section)  Progress towards OT goals: Progressing toward goals     Plan Discharge plan remains appropriate       AM-PAC OT "6 Clicks" Daily Activity     Outcome Measure   Help from another person eating meals?: None Help from another person taking care of personal grooming?: A Little Help from another person toileting, which includes using toliet, bedpan, or urinal?: A Little Help  from another person bathing (including washing, rinsing, drying)?: A Little Help from another person to put on and taking off regular upper body clothing?: A Little Help from another person to put on and taking off regular lower body clothing?: A Little 6 Click Score: 19    End of Session Equipment Utilized During Treatment: Rolling walker  OT Visit Diagnosis: Unsteadiness on feet (R26.81);History of falling (Z91.81);Muscle weakness (generalized) (M62.81);Repeated falls (R29.6);Other abnormalities of gait and mobility (R26.89)   Activity Tolerance Patient tolerated  treatment well   Patient Left in chair;with call bell/phone within reach;with family/visitor present   Nurse Communication          Time: [id number] OT Time Calculation (min): 24 min  Charges: OT General Charges $OT Visit: 1 Visit OT Treatments $Self Care/Home Management : 23-37 mins  Kari Baars, [place] [phone] Office- [place], Helen [name] 12/11/2019, 3:07 PM

## 2019-12-11 NOTE — Progress Notes (Signed)
PROGRESS NOTE    Helen [name]  [id number] DOB: 24-Jan-1930 DOA: 12/08/2019 PCP: Vernie Shanks, MD  Brief Narrative: 86 year old female with history of chronic combined systolic and diastolic CHF, EF of 123456, history of CVA, type 2 diabetes mellitus, paroxysmal atrial fibrillation on Eliquis, hypertension, dyslipidemia presented to the ED with multiple complaints. -Patient reports ongoing dizziness off and on for years, she reports worsening weakness in the last few days, along with nausea, upper abdominal tightness. -Presented to the emergency room,  was noted to have a sodium of 125 and a hemoglobin of 7.8 which are considerably different from her baseline  Assessment & Plan:   Hyponatremia -Sodium was in the 130s last year, 02/2019 -Admitted with sodium of 125, clinically appears euvolemic, now 127 -Lasix was held on admission and she was given gentle fluids -Urine sodium and osmolarity is high however its yield is low in the setting of recent diuretic use -TSH is normal -She was started on Celexa few weeks ago for depression which could also contribute to SIADH, hence this has been stopped -stable at 127 without symptoms, continue fluid restriction, lasix x1 now  Iron Deficiency anemia -Eliquis on hold -Hb down from 11.5 range, 2 years ago down  to 7.1  -Given IV iron x1 on 4/17 -Transfused 1 unit of PRBC on 4/18, hb now 8.3 [facility] gastroenterology consulted, given need for anticoagulation would benefit from endoscopy/colonoscopy, plan for EGD/colonoscopy on Wednesday  Chronic combined systolic and diastolic CHF -Clinically appears euvolemic, Lasix was on hold -Continue carvedilol and Entresto -resume lasix x1 now, recheck BMP this am and in am  Anxiety, depression -Xanax resumed at lower dose, if this is being weaned reportedly per PCP  Paroxysmal atrial fibrillation -Continue carvedilol, hold Eliquis  History of TIA -Hold Eliquis  DVT prophylaxis: SCDs Code  Status: Discussed CODE STATUS with the patient she wishes to be DNR Family Communication: Updated patient's son 4/18, updated daughter Dorian Pod 4/19 Disposition Plan: Dispo: The patient is from: Independent living              Anticipated d/c is to: Back to independent living              Anticipated d/c date is: To be determined              Patient currently being worked up for anemia and hyponatremia  Consultants:  [facility]  Procedures:   Subjective:  -Complains of weakness and some nausea  Objective: Vitals:   12/10/19 2057 12/10/19 2148 12/11/19 0611 12/11/19 1335  BP: (!) 155/55 (!) 147/61 (!) 162/61 (!) 150/45  Pulse: 64 63 (!) 56 (!) 59  Resp: 17 20 16 18   Temp: 98.4 F (36.9 C) (!) 97.4 F (36.3 C) 98.2 F (36.8 C) (!) 97.5 F (36.4 C)  TempSrc: Oral Axillary Oral Oral  SpO2: 91% 94% 93% 91%  Weight:   71.8 kg   Height:        Intake/Output Summary (Last 24 hours) at 12/11/2019 1357 Last data filed at 12/11/2019 1338 Gross per 24 hour  Intake 1671.67 ml  Output 700 ml  Net 971.67 ml   Filed Weights   12/08/19 0643 12/11/19 0611  Weight: 67.6 kg 71.8 kg    Examination: Gen: Elderly pleasant female sitting up in bed, AAOx3 HEENT: no JVD Lungs: Clear bilaterally CVS: S1-S2, regular rate rhythm Abd: soft, Non tender, non distended, BS present Extremities: No edema Skin: no new rashes on exposed skin Psychiatry: Mood &  affect appropriate.     Data Reviewed:   CBC: Recent Labs  Lab 12/08/19 0628 12/09/19 0540 12/10/19 0607 12/11/19 0537  WBC 11.1* 9.6 9.3 8.2  HGB 7.8* 7.4* 7.1* 8.3*  HCT 25.0* 24.0* 23.1* 26.6*  MCV 85.9 85.4 86.5 85.5  PLT 223 202 199 [id number]   Basic Metabolic Panel: Recent Labs  Lab 12/09/19 0540 12/09/19 1525 12/10/19 0607 12/10/19 1429 12/11/19 0537  NA 125* 127* 127* 127* 127*  K 3.9 4.5 4.0 4.2 3.7  CL 92* 91* 90* 91* 91*  CO2 24 26 31 28 27   GLUCOSE 141* 143* 116* 140* 106*  BUN 12 11 10 10 9   CREATININE 0.69  0.76 0.83 0.71 0.74  CALCIUM 8.1* 8.4* 8.5* 8.1* 8.3*   GFR: Estimated Creatinine Clearance: 46.2 mL/min (by C-G formula based on SCr of 0.74 mg/dL). Liver Function Tests: No results for input(s): AST, ALT, ALKPHOS, BILITOT, PROT, ALBUMIN in the last 168 hours. No results for input(s): LIPASE, AMYLASE in the last 168 hours. No results for input(s): AMMONIA in the last 168 hours. Coagulation Profile: No results for input(s): INR, PROTIME in the last 168 hours. Cardiac Enzymes: No results for input(s): CKTOTAL, CKMB, CKMBINDEX, TROPONINI in the last 168 hours. BNP (last 3 results) No results for input(s): PROBNP in the last 8760 hours. HbA1C: No results for input(s): HGBA1C in the last 72 hours. CBG: No results for input(s): GLUCAP in the last 168 hours. Lipid Profile: No results for input(s): CHOL, HDL, LDLCALC, TRIG, CHOLHDL, LDLDIRECT in the last 72 hours. Thyroid Function Tests: Recent Labs    12/10/19 1502  TSH 3.088   Anemia Panel: No results for input(s): VITAMINB12, FOLATE, FERRITIN, TIBC, IRON, RETICCTPCT in the last 72 hours. Urine analysis:    Component Value Date/Time   COLORURINE YELLOW 11/12/2016 0105   APPEARANCEUR Clear 03/12/2017 1440   LABSPEC 1.015 11/12/2016 0105   PHURINE 5.0 11/12/2016 0105   GLUCOSEU Negative 03/12/2017 1440   HGBUR NEGATIVE 11/12/2016 0105   BILIRUBINUR Negative 03/12/2017 1440   KETONESUR NEGATIVE 11/12/2016 0105   PROTEINUR Negative 03/12/2017 1440   PROTEINUR 100 (A) 11/12/2016 0105   NITRITE Negative 03/12/2017 1440   NITRITE NEGATIVE 11/12/2016 0105   LEUKOCYTESUR 2+ (A) 03/12/2017 1440   Sepsis Labs: @LABRCNTIP (procalcitonin:4,lacticidven:4)  ) Recent Results (from the past 240 hour(s))  SARS CORONAVIRUS 2 (TAT 6-24 HRS) Nasopharyngeal Nasopharyngeal Swab     Status: None   Collection Time: 12/08/19  9:31 AM   Specimen: Nasopharyngeal Swab  Result Value Ref Range Status   SARS Coronavirus 2 NEGATIVE NEGATIVE Final     Comment: (NOTE) SARS-CoV-2 target nucleic acids are NOT DETECTED. The SARS-CoV-2 RNA is generally detectable in upper and lower respiratory specimens during the acute phase of infection. Negative results do not preclude SARS-CoV-2 infection, do not rule out co-infections with other pathogens, and should not be used as the sole basis for treatment or other patient management decisions. Negative results must be combined with clinical observations, patient history, and epidemiological information. The expected result is Negative. Fact Sheet for Patients: SugarRoll.be Fact Sheet for Healthcare Providers: https://www.woods-mathews.com/ This test is not yet approved or cleared by the [place] FDA and  has been authorized for detection and/or diagnosis of SARS-CoV-2 by FDA under an Emergency Use Authorization (EUA). This EUA will remain  in effect (meaning this test can be used) for the duration of the COVID-19 declaration under Section 56 4(b)(1) of the Act, 21 U.S.C. section 360bbb-3(b)(1), unless the authorization  is terminated or revoked sooner. Performed at [facility], [place] [address] [place],  [address]      Radiology Studies: No results found.  Scheduled Meds: . sodium chloride   Intravenous Once  . atorvastatin  80 mg Oral QHS  . carvedilol  25 mg Oral BID WC  . pantoprazole  40 mg Oral Daily  . polyethylene glycol-electrolytes  4,000 mL Oral Once  . [START ON 12/12/2019] polyethylene glycol-electrolytes  4,000 mL Oral Once  . sacubitril-valsartan  1 tablet Oral BID   Continuous Infusions:   LOS: 2 days   Time spent: 59min  Domenic Polite, MD [facility]  12/11/2019, 1:57 PM

## 2019-12-11 NOTE — Progress Notes (Signed)
[facility] Gastroenterology Progress Note  Mili Claytor 86 y.o. 1929-12-03  CC: Anemia, epigastric pain  Subjective: Patient states she is feeling somewhat lightheaded/dizzy today.  She denies any current abdominal pain, though she notes intermittent "gnawing" in her epigastrium.  She denies seeing any bloody or melenic stools.  She denies nausea, vomiting.  ROS : Review of Systems  Gastrointestinal: Negative for abdominal pain, blood in stool, constipation, diarrhea, heartburn, melena, nausea and vomiting.  Neurological: Positive for dizziness. Negative for loss of consciousness.    Objective: Vital signs in last 24 hours: Vitals:   12/10/19 2148 12/11/19 0611  BP: (!) 147/61 (!) 162/61  Pulse: 63 (!) 56  Resp: 20 16  Temp: (!) 97.4 F (36.3 C) 98.2 F (36.8 C)  SpO2: 94% 93%    Physical Exam: Physical Exam  Constitutional: She is oriented to person, place, and time. She appears well-developed and well-nourished. No distress.  Cardiovascular: Normal rate, regular rhythm and normal heart sounds.  Pulmonary/Chest: Effort normal and breath sounds normal. No respiratory distress.  Abdominal: Soft. Bowel sounds are normal. She exhibits no distension and no mass. There is no abdominal tenderness. There is no rebound and no guarding.  Musculoskeletal:        General: No deformity or edema.  Neurological: She is oriented to person, place, and time.  Sleeping but easily aroused to voice alone   Skin: Skin is warm and dry.  Psychiatric: She has a normal mood and affect. Her behavior is normal.    Lab Results: Recent Labs    12/10/19 1429 12/11/19 0537  NA 127* 127*  K 4.2 3.7  CL 91* 91*  CO2 28 27  GLUCOSE 140* 106*  BUN 10 9  CREATININE 0.71 0.74  CALCIUM 8.1* 8.3*   No results for input(s): AST, ALT, ALKPHOS, BILITOT, PROT, ALBUMIN in the last 72 hours. Recent Labs    12/10/19 0607 12/11/19 0537  WBC 9.3 8.2  HGB 7.1* 8.3*  HCT 23.1* 26.6*  MCV 86.5 85.5  PLT  199 185   No results for input(s): LABPROT, INR in the last 72 hours.    Assessment Iron deficiency anemia. Patient is on Eliquis.  No visible melena or hematochezia.  Hospital team would like to resume Eliquis but request evaluation for GI bleeding prior to resumption.  Hemoglobin 8.3 today, appropriate rise after 1u pRBCs yesterday morning (Hgb 7.1 pre-transfusion).    Plan: Continue to hold Eliquis.    EGD/colonoscopy on Wednesday 4/21.  Will prep with one gallon NuLYTELY today and one gallon NULYTELY tomorrow.  Continue to monitor H&H with transfusion as needed to maintain Hgb >8.  [facility] will follow.   Salley Slaughter PA-C 12/11/2019, 1:01 PM  Contact #  [phone]

## 2019-12-12 LAB — BASIC METABOLIC PANEL
Anion gap: 8 (ref 5–15)
Anion gap: 9 (ref 5–15)
Anion gap: 11 (ref 5–15)
BUN: 8 mg/dL (ref 8–23)
BUN: 9 mg/dL (ref 8–23)
BUN: 7 mg/dL — ABNORMAL LOW (ref 8–23)
CO2: 28 mmol/L (ref 22–32)
CO2: 29 mmol/L (ref 22–32)
CO2: 27 mmol/L (ref 22–32)
Calcium: 8.4 mg/dL — ABNORMAL LOW (ref 8.9–10.3)
Calcium: 8.5 mg/dL — ABNORMAL LOW (ref 8.9–10.3)
Calcium: 8.8 mg/dL — ABNORMAL LOW (ref 8.9–10.3)
Chloride: 92 mmol/L — ABNORMAL LOW (ref 98–111)
Chloride: 91 mmol/L — ABNORMAL LOW (ref 98–111)
Chloride: 91 mmol/L — ABNORMAL LOW (ref 98–111)
Creatinine, Ser: 0.8 mg/dL (ref 0.44–1.00)
Creatinine, Ser: 0.86 mg/dL (ref 0.44–1.00)
Creatinine, Ser: 0.8 mg/dL (ref 0.44–1.00)
GFR calc Af Amer: >60 mL/min (ref >60)
GFR calc Af Amer: >60 mL/min (ref >60)
GFR calc Af Amer: >60 mL/min (ref >60)
GFR calc non Af Amer: >60 mL/min (ref >60)
GFR calc non Af Amer: >60 mL/min (ref >60)
GFR calc non Af Amer: >60 mL/min (ref >60)
Glucose, Bld: 97 mg/dL (ref 70–99)
Glucose, Bld: 137 mg/dL — ABNORMAL HIGH (ref 70–99)
Glucose, Bld: 152 mg/dL — ABNORMAL HIGH (ref 70–99)
Potassium: 3.5 mmol/L (ref 3.5–5.1)
Potassium: 3.7 mmol/L (ref 3.5–5.1)
Potassium: 3.7 mmol/L (ref 3.5–5.1)
Sodium: 128 mmol/L — ABNORMAL LOW (ref 135–145)
Sodium: 129 mmol/L — ABNORMAL LOW (ref 135–145)
Sodium: 129 mmol/L — ABNORMAL LOW (ref 135–145)

## 2019-12-12 LAB — SODIUM, URINE, RANDOM: Sodium, Ur: 15 mmol/L

## 2019-12-12 LAB — CBC
HCT: 27.5 % — ABNORMAL LOW (ref 36.0–46.0)
Hemoglobin: 8.8 g/dL — ABNORMAL LOW (ref 12.0–15.0)
MCH: 27.6 pg (ref 26.0–34.0)
MCHC: 32 g/dL (ref 30.0–36.0)
MCV: 86.2 fL (ref 80.0–100.0)
Platelets: 189 10*3/uL (ref 150–400)
RBC: 3.19 MIL/uL — ABNORMAL LOW (ref 3.87–5.11)
RDW: 16.6 % — ABNORMAL HIGH (ref 11.5–15.5)
WBC: 8.7 10*3/uL (ref 4.0–10.5)
nRBC: 0 % (ref 0.0–0.2)

## 2019-12-12 LAB — OSMOLALITY, URINE: Osmolality, Ur: 151 mOsm/kg — ABNORMAL LOW (ref 300–900)

## 2019-12-12 MED ORDER — SODIUM CHLORIDE 0.9 % IV SOLN
510.0000 mg | Freq: Once | INTRAVENOUS | Status: AC
  Administered 2019-12-12: 13:00:00 510 mg via INTRAVENOUS
  Filled 2019-12-12: qty 510

## 2019-12-12 MED ORDER — SACUBITRIL-VALSARTAN 49-51 MG PO TABS
1.0000 | ORAL_TABLET | Freq: Two times a day (BID) | ORAL | Status: DC
  Administered 2019-12-13 – 2019-12-14 (×2): 1 via ORAL
  Filled 2019-12-12 (×4): qty 1

## 2019-12-12 NOTE — Care Management Important Message (Signed)
Important Message  Patient Details IM Letter given to [name] RN Case Manager to present to the Patient Name: Helen [name] MRN: [id number] Date of Birth: 03-07-1930   Medicare Important Message Given:  Yes     Kerin Salen 12/12/2019, 9:57 AM

## 2019-12-12 NOTE — Progress Notes (Signed)
PROGRESS NOTE    Sherronda Waddell  [id number] DOB: 08-06-1930 DOA: 12/08/2019 PCP: Vernie Shanks, MD  Brief Narrative: 86 year old female with history of chronic combined systolic and diastolic CHF, EF of 123456, history of CVA, type 2 diabetes mellitus, paroxysmal atrial fibrillation on Eliquis, hypertension, dyslipidemia presented to the ED with multiple complaints. -Patient reports ongoing dizziness off and on for years, she reports worsening weakness in the last few days, along with nausea, upper abdominal tightness. -Presented to the emergency room,  was noted to have a sodium of 125 and a hemoglobin of 7.8 which are considerably different from her baseline  Assessment & Plan:   Hyponatremia -Sodium was in the 130s last year, 02/2019 -Admitted with sodium of 125, clinically appears euvolemic -Lasix was held on admission and she was given gentle fluids -Urine sodium and osmolarity is high initially however its yield is low in the setting of diuretic use -TSH is normal -She was started on Celexa few weeks ago for depression which could also contribute to SIADH, hence this has been stopped -Sodium improving slowly, repeat urine studies, Celexa on hold, continue fluid restriction -Monitor closely with starting GoLYTELY prep for colonoscopy tomorrow -Resume Lasix depending on volume status  Iron Deficiency anemia -Eliquis on hold -Hb down from 11.5 range, 2 years ago down  to 7.1 this admission, anemia panel with iron deficiency -Given IV iron x1 on 4/17 -Transfused 1 unit of PRBC on 4/18, hb now 8.3 [facility] gastroenterology consulted, plan for EGD/colonoscopy on Wednesday tomorrow if sodium stable, improving  Chronic combined systolic and diastolic CHF -Clinically appears euvolemic, Lasix was on hold -Continue carvedilol and Entresto -Given a dose of oral Lasix yesterday, euvolemic today, hold diuretics today in the setting of ongoing fluid losses with GoLYTELY prep  Anxiety,  depression -Xanax resumed at lower dose, if this is being weaned reportedly per PCP  Paroxysmal atrial fibrillation -Continue carvedilol, hold Eliquis  History of TIA -Hold Eliquis  DVT prophylaxis: SCDs Code Status: Discussed CODE STATUS with the patient she wishes to be DNR Family Communication: Updated patient's son 4/18, updated daughter Dorian Pod 4/19 Disposition Plan: Dispo: The patient is from: Independent living              Anticipated d/c is to: Back to independent living              Anticipated d/c date is: To be determined              Patient currently being worked up for anemia and hyponatremia  Consultants:  [facility]  Procedures:   Subjective: -Feels better today, denies any shortness of breath or swelling, having frequent watery stools after starting GoLYTELY prep Objective: Vitals:   12/11/19 1335 12/11/19 2136 12/12/19 0539 12/12/19 0800  BP: (!) 150/45 138/88 (!) 83/58 (!) 121/48  Pulse: (!) 59 73 92 66  Resp: 18 18 19 18   Temp: (!) 97.5 F (36.4 C) 98.4 F (36.9 C) 98.1 F (36.7 C) 98 F (36.7 C)  TempSrc: Oral Oral Oral Oral  SpO2: 91% 90% (!) 75% 93%  Weight:      Height:        Intake/Output Summary (Last 24 hours) at 12/12/2019 1352 Last data filed at 12/12/2019 0452 Gross per 24 hour  Intake 0 ml  Output 250 ml  Net -250 ml   Filed Weights   12/08/19 0643 12/11/19 0611  Weight: 67.6 kg 71.8 kg    Examination: Gen: Elderly pleasant female sitting up  in bed, AAOx3 HEENT:  no JVD Lungs: Clear CVS: S1-S2, regular rate rhythm  abd: soft, Non tender, mildly distended, BS present Extremities: No edema Skin: no new rashes on exposed skin Psychiatry: Mood & affect appropriate.     Data Reviewed:   CBC: Recent Labs  Lab 12/08/19 0628 12/09/19 0540 12/10/19 0607 12/11/19 0537 12/12/19 0555  WBC 11.1* 9.6 9.3 8.2 8.7  HGB 7.8* 7.4* 7.1* 8.3* 8.8*  HCT 25.0* 24.0* 23.1* 26.6* 27.5*  MCV 85.9 85.4 86.5 85.5 86.2  PLT 223 202 199 185  [id number]   Basic Metabolic Panel: Recent Labs  Lab 12/10/19 1429 12/11/19 0537 12/11/19 1537 12/12/19 0555 12/12/19 1242  NA 127* 127* 126* 128* 129*  K 4.2 3.7 3.7 3.5 3.7  CL 91* 91* 89* 92* 91*  CO2 28 27 28 28 29   GLUCOSE 140* 106* 138* 97 137*  BUN 10 9 10 8 9   CREATININE 0.71 0.74 0.77 0.80 0.86  CALCIUM 8.1* 8.3* 8.3* 8.4* 8.5*   GFR: Estimated Creatinine Clearance: 43 mL/min (by C-G formula based on SCr of 0.86 mg/dL). Liver Function Tests: No results for input(s): AST, ALT, ALKPHOS, BILITOT, PROT, ALBUMIN in the last 168 hours. No results for input(s): LIPASE, AMYLASE in the last 168 hours. No results for input(s): AMMONIA in the last 168 hours. Coagulation Profile: No results for input(s): INR, PROTIME in the last 168 hours. Cardiac Enzymes: No results for input(s): CKTOTAL, CKMB, CKMBINDEX, TROPONINI in the last 168 hours. BNP (last 3 results) No results for input(s): PROBNP in the last 8760 hours. HbA1C: No results for input(s): HGBA1C in the last 72 hours. CBG: No results for input(s): GLUCAP in the last 168 hours. Lipid Profile: No results for input(s): CHOL, HDL, LDLCALC, TRIG, CHOLHDL, LDLDIRECT in the last 72 hours. Thyroid Function Tests: Recent Labs    12/10/19 1502  TSH 3.088   Anemia Panel: No results for input(s): VITAMINB12, FOLATE, FERRITIN, TIBC, IRON, RETICCTPCT in the last 72 hours. Urine analysis:    Component Value Date/Time   COLORURINE YELLOW 11/12/2016 0105   APPEARANCEUR Clear 03/12/2017 1440   LABSPEC 1.015 11/12/2016 0105   PHURINE 5.0 11/12/2016 0105   GLUCOSEU Negative 03/12/2017 1440   HGBUR NEGATIVE 11/12/2016 0105   BILIRUBINUR Negative 03/12/2017 1440   KETONESUR NEGATIVE 11/12/2016 0105   PROTEINUR Negative 03/12/2017 1440   PROTEINUR 100 (A) 11/12/2016 0105   NITRITE Negative 03/12/2017 1440   NITRITE NEGATIVE 11/12/2016 0105   LEUKOCYTESUR 2+ (A) 03/12/2017 1440   Sepsis  Labs: @LABRCNTIP (procalcitonin:4,lacticidven:4)  ) Recent Results (from the past 240 hour(s))  SARS CORONAVIRUS 2 (TAT 6-24 HRS) Nasopharyngeal Nasopharyngeal Swab     Status: None   Collection Time: 12/08/19  9:31 AM   Specimen: Nasopharyngeal Swab  Result Value Ref Range Status   SARS Coronavirus 2 NEGATIVE NEGATIVE Final    Comment: (NOTE) SARS-CoV-2 target nucleic acids are NOT DETECTED. The SARS-CoV-2 RNA is generally detectable in upper and lower respiratory specimens during the acute phase of infection. Negative results do not preclude SARS-CoV-2 infection, do not rule out co-infections with other pathogens, and should not be used as the sole basis for treatment or other patient management decisions. Negative results must be combined with clinical observations, patient history, and epidemiological information. The expected result is Negative. Fact Sheet for Patients: SugarRoll.be Fact Sheet for Healthcare Providers: https://www.woods-mathews.com/ This test is not yet approved or cleared by the [place] FDA and  has been authorized for detection and/or diagnosis of  SARS-CoV-2 by FDA under an Emergency Use Authorization (EUA). This EUA will remain  in effect (meaning this test can be used) for the duration of the COVID-19 declaration under Section 56 4(b)(1) of the Act, 21 U.S.C. section 360bbb-3(b)(1), unless the authorization is terminated or revoked sooner. Performed at [facility], [place] [address] [place], [place] [address]      Radiology Studies: No results found.  Scheduled Meds: . sodium chloride   Intravenous Once  . atorvastatin  80 mg Oral QHS  . carvedilol  25 mg Oral BID WC  . pantoprazole  40 mg Oral Daily  . [START ON 12/13/2019] sacubitril-valsartan  1 tablet Oral BID   Continuous Infusions:   LOS: 3 days   Time spent: 51min  Domenic Polite, MD [facility]  12/12/2019, 1:52 PM

## 2019-12-12 NOTE — H&P (View-Only) (Signed)
[facility] Gastroenterology Progress Note  Helen [name] 86 y.o. October 02, 1929  CC:  anemia  Subjective: Patient reports feeling well today.  She has tolerated prep and has finished approximately 1/2 weeks ago.  She has had several loose bowel movements since starting prep.  She has not seen any rectal bleeding or melena.  She has no abdominal pain.  ROS : Review of Systems  Constitutional: Negative for chills and fever.  Gastrointestinal: Negative for abdominal pain, blood in stool, constipation, diarrhea, heartburn, melena, nausea and vomiting.  Neurological: Negative for dizziness and loss of consciousness.   Objective: Vital signs in last 24 hours: Vitals:   12/12/19 0539 12/12/19 0800  BP: (!) 83/58 (!) 121/48  Pulse: 92 66  Resp: 19 18  Temp: 98.1 F (36.7 C) 98 F (36.7 C)  SpO2: (!) 75% 93%    Physical Exam:  General:  Alert, oriented, cooperative, no distress, appears stated age  Head:  Normocephalic, without obvious abnormality, atraumatic  Eyes:  Anicteric sclera, EOM's intact  Lungs:   Clear to auscultation bilaterally, respirations unlabored  Heart:  Regular rate and rhythm, S1, S2 normal  Abdomen:   Soft, non-tender, nondistended, bowel sounds active all four quadrants,  no masses  Extremities: Extremities normal, atraumatic, no  edema  Pulses: 2+ and symmetric    Lab Results: Recent Labs    12/11/19 1537 12/12/19 0555  NA 126* 128*  K 3.7 3.5  CL 89* 92*  CO2 28 28  GLUCOSE 138* 97  BUN 10 8  CREATININE 0.77 0.80  CALCIUM 8.3* 8.4*   No results for input(s): AST, ALT, ALKPHOS, BILITOT, PROT, ALBUMIN in the last 72 hours. Recent Labs    12/11/19 0537 12/12/19 0555  WBC 8.2 8.7  HGB 8.3* 8.8*  HCT 26.6* 27.5*  MCV 85.5 86.2  PLT 185 189   No results for input(s): LABPROT, INR in the last 72 hours.  Assessment Iron deficiency anemia.  Patient on Eliquis.  No visible melena or hematochezia.  Hospital team would like to resume Eliquis but  requests evaluation for GI bleeding prior to resumption.  Hemoglobin stable at 8.8 today, 8.3 yesterday.  Plan: Continue to hold Eliquis. EGD/colonoscopy tomorrow 4/21.  Patient to complete finish jug of NuLYTELY prep today.  Continue to monitor H&H with transfusion as needed to maintain Hgb >8.  [facility] will follow.  Salley Slaughter PA-C 12/12/2019, 10:55 AM  Contact #  [phone]

## 2019-12-12 NOTE — Progress Notes (Signed)
[facility] Gastroenterology Progress Note  Helen [name] 86 y.o. 1930/07/03  CC:  anemia  Subjective: Patient reports feeling well today.  She has tolerated prep and has finished approximately 1/2 weeks ago.  She has had several loose bowel movements since starting prep.  She has not seen any rectal bleeding or melena.  She has no abdominal pain.  ROS : Review of Systems  Constitutional: Negative for chills and fever.  Gastrointestinal: Negative for abdominal pain, blood in stool, constipation, diarrhea, heartburn, melena, nausea and vomiting.  Neurological: Negative for dizziness and loss of consciousness.   Objective: Vital signs in last 24 hours: Vitals:   12/12/19 0539 12/12/19 0800  BP: (!) 83/58 (!) 121/48  Pulse: 92 66  Resp: 19 18  Temp: 98.1 F (36.7 C) 98 F (36.7 C)  SpO2: (!) 75% 93%    Physical Exam:  General:  Alert, oriented, cooperative, no distress, appears stated age  Head:  Normocephalic, without obvious abnormality, atraumatic  Eyes:  Anicteric sclera, EOM's intact  Lungs:   Clear to auscultation bilaterally, respirations unlabored  Heart:  Regular rate and rhythm, S1, S2 normal  Abdomen:   Soft, non-tender, nondistended, bowel sounds active all four quadrants,  no masses  Extremities: Extremities normal, atraumatic, no  edema  Pulses: 2+ and symmetric    Lab Results: Recent Labs    12/11/19 1537 12/12/19 0555  NA 126* 128*  K 3.7 3.5  CL 89* 92*  CO2 28 28  GLUCOSE 138* 97  BUN 10 8  CREATININE 0.77 0.80  CALCIUM 8.3* 8.4*   No results for input(s): AST, ALT, ALKPHOS, BILITOT, PROT, ALBUMIN in the last 72 hours. Recent Labs    12/11/19 0537 12/12/19 0555  WBC 8.2 8.7  HGB 8.3* 8.8*  HCT 26.6* 27.5*  MCV 85.5 86.2  PLT 185 189   No results for input(s): LABPROT, INR in the last 72 hours.  Assessment Iron deficiency anemia.  Patient on Eliquis.  No visible melena or hematochezia.  Hospital team would like to resume Eliquis but  requests evaluation for GI bleeding prior to resumption.  Hemoglobin stable at 8.8 today, 8.3 yesterday.  Plan: Continue to hold Eliquis. EGD/colonoscopy tomorrow 4/21.  Patient to complete finish jug of NuLYTELY prep today.  Continue to monitor H&H with transfusion as needed to maintain Hgb >8.  [facility] will follow.  Salley Slaughter PA-C 12/12/2019, 10:55 AM  Contact #  [phone]

## 2019-12-13 ENCOUNTER — Encounter (HOSPITAL_COMMUNITY)
Admission: EM | Disposition: A | Discharge status: Home Health | Payer: Self-pay | Source: Skilled Nursing Facility | Attending: Internal Medicine

## 2019-12-13 ENCOUNTER — Inpatient Hospital Stay (HOSPITAL_COMMUNITY): Payer: Medicare Other | Admitting: Certified Registered"

## 2019-12-13 ENCOUNTER — Encounter (HOSPITAL_COMMUNITY): Payer: Self-pay | Admitting: Internal Medicine

## 2019-12-13 DIAGNOSIS — I5042 Chronic combined systolic (congestive) and diastolic (congestive) heart failure: Secondary | ICD-10-CM

## 2019-12-13 DIAGNOSIS — D638 Anemia in other chronic diseases classified elsewhere: Secondary | ICD-10-CM

## 2019-12-13 DIAGNOSIS — E119 Type 2 diabetes mellitus without complications: Secondary | ICD-10-CM

## 2019-12-13 DIAGNOSIS — I10 Essential (primary) hypertension: Secondary | ICD-10-CM

## 2019-12-13 DIAGNOSIS — D500 Iron deficiency anemia secondary to blood loss (chronic): Secondary | ICD-10-CM

## 2019-12-13 DIAGNOSIS — I739 Peripheral vascular disease, unspecified: Secondary | ICD-10-CM

## 2019-12-13 HISTORY — PX: POLYPECTOMY: SHX5525

## 2019-12-13 HISTORY — PX: BIOPSY: SHX5522

## 2019-12-13 HISTORY — PX: COLONOSCOPY WITH PROPOFOL: SHX5780

## 2019-12-13 HISTORY — PX: ESOPHAGOGASTRODUODENOSCOPY: SHX5428

## 2019-12-13 LAB — BASIC METABOLIC PANEL
Anion gap: 9 (ref 5–15)
BUN: 7 mg/dL — ABNORMAL LOW (ref 8–23)
CO2: 30 mmol/L (ref 22–32)
Calcium: 8.9 mg/dL (ref 8.9–10.3)
Chloride: 90 mmol/L — ABNORMAL LOW (ref 98–111)
Creatinine, Ser: 0.76 mg/dL (ref 0.44–1.00)
GFR calc Af Amer: >60 mL/min (ref >60)
GFR calc non Af Amer: >60 mL/min (ref >60)
Glucose, Bld: 110 mg/dL — ABNORMAL HIGH (ref 70–99)
Potassium: 4 mmol/L (ref 3.5–5.1)
Sodium: 129 mmol/L — ABNORMAL LOW (ref 135–145)

## 2019-12-13 LAB — CBC
HCT: 28.6 % — ABNORMAL LOW (ref 36.0–46.0)
Hemoglobin: 9.1 g/dL — ABNORMAL LOW (ref 12.0–15.0)
MCH: 27.4 pg (ref 26.0–34.0)
MCHC: 31.8 g/dL (ref 30.0–36.0)
MCV: 86.1 fL (ref 80.0–100.0)
Platelets: 195 10*3/uL (ref 150–400)
RBC: 3.32 MIL/uL — ABNORMAL LOW (ref 3.87–5.11)
RDW: 16.9 % — ABNORMAL HIGH (ref 11.5–15.5)
WBC: 10.1 10*3/uL (ref 4.0–10.5)
nRBC: 0 % (ref 0.0–0.2)

## 2019-12-13 LAB — TSH: TSH: 2.751 u[IU]/mL (ref 0.350–4.500)

## 2019-12-13 LAB — GLUCOSE, CAPILLARY: Glucose-Capillary: 131 mg/dL — ABNORMAL HIGH (ref 70–99)

## 2019-12-13 SURGERY — EGD (ESOPHAGOGASTRODUODENOSCOPY)
Anesthesia: Monitor Anesthesia Care

## 2019-12-13 MED ORDER — EPHEDRINE SULFATE-NACL 50-0.9 MG/10ML-% IV SOSY
PREFILLED_SYRINGE | INTRAVENOUS | Status: DC | PRN
  Administered 2019-12-13: 10 mg via INTRAVENOUS
  Administered 2019-12-13: 5 mg via INTRAVENOUS
  Administered 2019-12-13: 10 mg via INTRAVENOUS
  Administered 2019-12-13: 5 mg via INTRAVENOUS
  Administered 2019-12-13: 10 mg via INTRAVENOUS
  Administered 2019-12-13: 5 mg via INTRAVENOUS

## 2019-12-13 MED ORDER — LORAZEPAM 2 MG/ML IJ SOLN
1.0000 mg | Freq: Once | INTRAMUSCULAR | Status: AC
  Administered 2019-12-13: 1 mg via INTRAVENOUS
  Filled 2019-12-13: qty 1

## 2019-12-13 MED ORDER — FERROUS SULFATE 325 (65 FE) MG PO TABS
325.0000 mg | ORAL_TABLET | Freq: Three times a day (TID) | ORAL | Status: DC
  Administered 2019-12-13 – 2019-12-14 (×3): 325 mg via ORAL
  Filled 2019-12-13 (×3): qty 1

## 2019-12-13 MED ORDER — LACTATED RINGERS IV SOLN: INTRAVENOUS | Status: DC | PRN

## 2019-12-13 MED ORDER — FERROUS SULFATE 325 (65 FE) MG PO TABS: 325.0000 mg | ORAL_TABLET | Freq: Three times a day (TID) | ORAL | Status: DC

## 2019-12-13 MED ORDER — PROPOFOL 500 MG/50ML IV EMUL
INTRAVENOUS | Status: DC | PRN
  Administered 2019-12-13: 50 ug/kg/min via INTRAVENOUS

## 2019-12-13 MED ORDER — SODIUM CHLORIDE 0.9 % IV SOLN: 510.0000 mg | Freq: Once | INTRAVENOUS | Status: DC

## 2019-12-13 MED ORDER — PROPOFOL 10 MG/ML IV BOLUS
INTRAVENOUS | Status: AC
  Filled 2019-12-13: qty 20

## 2019-12-13 MED ORDER — PROPOFOL 10 MG/ML IV BOLUS
INTRAVENOUS | Status: DC | PRN
  Administered 2019-12-13 (×2): 10 mg via INTRAVENOUS

## 2019-12-13 MED ORDER — PROPOFOL 500 MG/50ML IV EMUL
INTRAVENOUS | Status: AC
  Filled 2019-12-13: qty 50

## 2019-12-13 MED ORDER — LIDOCAINE 2% (20 MG/ML) 5 ML SYRINGE
INTRAMUSCULAR | Status: DC | PRN
  Administered 2019-12-13: 40 mg via INTRAVENOUS

## 2019-12-13 MED ORDER — POLYETHYLENE GLYCOL 3350 17 G PO PACK: 17.0000 g | PACK | Freq: Every day | ORAL | Status: DC | PRN

## 2019-12-13 MED ORDER — SENNOSIDES-DOCUSATE SODIUM 8.6-50 MG PO TABS: 2.0000 | ORAL_TABLET | Freq: Every evening | ORAL | Status: DC | PRN

## 2019-12-13 MED ORDER — PHENYLEPHRINE 40 MCG/ML (10ML) SYRINGE FOR IV PUSH (FOR BLOOD PRESSURE SUPPORT)
PREFILLED_SYRINGE | INTRAVENOUS | Status: DC | PRN
  Administered 2019-12-13: 40 ug via INTRAVENOUS

## 2019-12-13 SURGICAL SUPPLY — 22 items

## 2019-12-13 NOTE — Transfer of Care (Signed)
Immediate Anesthesia Transfer of Care Note  Patient: Seletha Zimmermann  Procedure(s) Performed: ESOPHAGOGASTRODUODENOSCOPY (EGD) (N/A ) COLONOSCOPY WITH PROPOFOL (N/A ) BIOPSY POLYPECTOMY  Patient Location: PACU and Endoscopy Unit  Anesthesia Type:MAC  Level of Consciousness: awake, alert  and oriented  Airway & Oxygen Therapy: Patient Spontanous Breathing and Patient connected to face mask oxygen  Post-op Assessment: Report given to RN and Post -op Vital signs reviewed and stable  Post vital signs: Reviewed and stable  Last Vitals:  Vitals Value Taken Time  BP    Temp    Pulse 73 12/13/19 1539  Resp 18 12/13/19 1539  SpO2 100 % 12/13/19 1539  Vitals shown include unvalidated device data.  Last Pain:  Vitals:   12/13/19 1342  TempSrc: Oral  PainSc: 0-No pain      Patients Stated Pain Goal: 0 (61/47/09 2957)  Complications: No apparent anesthesia complications

## 2019-12-13 NOTE — Progress Notes (Signed)
OT Cancellation Note  Patient Details Name: Helen [name] MRN: [id number] DOB: 10/22/1929   Cancelled Treatment:    Reason Eval/Treat Not Completed: Other (comment). Pt politely declined, stated that she is waiting to be taken for EGD/colonoscopy. Plan to reattempt at another date/time.  Tyrone Schimke, OT Acute Rehabilitation Services Pager: [phone] Office: [phone]  12/13/2019, 11:23 AM

## 2019-12-13 NOTE — Progress Notes (Signed)
PROGRESS NOTE    Helen [name]  [id number] DOB: 26-Apr-1930 DOA: 12/08/2019 PCP: Vernie Shanks, MD   Brief Narrative:  86 year old female with history of chronic combined systolic and diastolic CHF, EF of 123456, history of CVA, type 2 diabetes mellitus, paroxysmal atrial fibrillation on Eliquis, hypertension, dyslipidemia presented to the ED with multiple complaints.  Admitted for hyponatremia and iron deficiency anemia.  Seen by GI planning for endoscopic evaluation 4/21. Assessment & Plan:   Active Problems:   Diabetes mellitus without complication (HCC)   Peripheral vascular disease (HCC)   Hyponatremia   Leukocytosis   Essential hypertension   Anemia of chronic disease   Chronic combined systolic and diastolic heart failure (HCC)   Nonischemic cardiomyopathy (HCC)  Hyponatremia Chronically her sodium level runs in 130s.  Admission sodium 125.  Lasix held initially, gentle hydration.  TSH normal. Celexa held. Urine studies-unsure of its etiology as patient is on diuretic. Closely monitor this for now.  Iron Deficiency anemia Currently Eliquis on hold.  Status post 1 unit PRBC transfusion Endoscopic evaluation per GI today-EGD/colonoscopy IV iron today, p.o. supplement starting tomorrow Bowel regimen  Chronic combined systolic and diastolic CHF Overall appears to be euvolemic.  Continue Coreg/Entresto Lasix currently on hold.  Anxiety, depression -Xanax resumed at lower dose, if this is being weaned reportedly per PCP  Paroxysmal atrial fibrillation Continue Coreg Eliquis on hold  History of TIA Holding Eliquis    DVT prophylaxis: SCDs Code Status: DNR Family Communication:   Disposition Plan:   Patient From= independent living  Patient Anticipated D/C place= to be determined  Barriers= currently ongoing work-up for iron deficiency anemia hyponatremia.  Plans are for endoscopic evaluation today     Subjective: Feels okay, no complaints  Review  of Systems Otherwise negative except as per HPI, including: General: Denies fever, chills, night sweats or unintended weight loss. Resp: Denies cough, wheezing, shortness of breath. Cardiac: Denies chest pain, palpitations, orthopnea, paroxysmal nocturnal dyspnea. GI: Denies abdominal pain, nausea, vomiting, diarrhea or constipation GU: Denies dysuria, frequency, hesitancy or incontinence MS: Denies muscle aches, joint pain or swelling Neuro: Denies headache, neurologic deficits (focal weakness, numbness, tingling), abnormal gait Psych: Denies anxiety, depression, SI/HI/AVH Skin: Denies new rashes or lesions ID: Denies sick contacts, exotic exposures, travel  Examination:  General exam: Appears calm and comfortable  Respiratory system: Clear to auscultation. Respiratory effort normal. Cardiovascular system: S1 & S2 heard, RRR. No JVD, murmurs, rubs, gallops or clicks. No pedal edema. Gastrointestinal system: Abdomen is nondistended, soft and nontender. No organomegaly or masses felt. Normal bowel sounds heard. Central nervous system: Alert and oriented. No focal neurological deficits. Extremities: Symmetric 5 x 5 power. Skin: No rashes, lesions or ulcers Psychiatry: Judgement and insight appear normal. Mood & affect appropriate.     Objective: Vitals:   12/12/19 1654 12/12/19 1656 12/12/19 2203 12/13/19 0459  BP: (!) 162/57 117/69 (!) 153/52 (!) 159/64  Pulse: 69 62 (!) 59 72  Resp:  16 (!) 24   Temp:   98.1 F (36.7 C) 98.2 F (36.8 C)  TempSrc:   Oral Oral  SpO2: 91%  93% 91%  Weight:      Height:        Intake/Output Summary (Last 24 hours) at 12/13/2019 0909 Last data filed at 12/12/2019 1500 Gross per 24 hour  Intake 100 ml  Output --  Net 100 ml   Filed Weights   12/08/19 0643 12/11/19 0611  Weight: 67.6 kg 71.8 kg  Data Reviewed:   CBC: Recent Labs  Lab 12/09/19 0540 12/10/19 0607 12/11/19 0537 12/12/19 0555 12/13/19 0041  WBC 9.6 9.3 8.2 8.7  10.1  HGB 7.4* 7.1* 8.3* 8.8* 9.1*  HCT 24.0* 23.1* 26.6* 27.5* 28.6*  MCV 85.4 86.5 85.5 86.2 86.1  PLT 202 199 185 189 0000000   Basic Metabolic Panel: Recent Labs  Lab 12/11/19 1537 12/12/19 0555 12/12/19 1242 12/12/19 1841 12/13/19 0041  NA 126* 128* 129* 129* 129*  K 3.7 3.5 3.7 3.7 4.0  CL 89* 92* 91* 91* 90*  CO2 28 28 29 27 30   GLUCOSE 138* 97 137* 152* 110*  BUN 10 8 9  7* 7*  CREATININE 0.77 0.80 0.86 0.80 0.76  CALCIUM 8.3* 8.4* 8.5* 8.8* 8.9   GFR: Estimated Creatinine Clearance: 46.2 mL/min (by C-G formula based on SCr of 0.76 mg/dL). Liver Function Tests: No results for input(s): AST, ALT, ALKPHOS, BILITOT, PROT, ALBUMIN in the last 168 hours. No results for input(s): LIPASE, AMYLASE in the last 168 hours. No results for input(s): AMMONIA in the last 168 hours. Coagulation Profile: No results for input(s): INR, PROTIME in the last 168 hours. Cardiac Enzymes: No results for input(s): CKTOTAL, CKMB, CKMBINDEX, TROPONINI in the last 168 hours. BNP (last 3 results) No results for input(s): PROBNP in the last 8760 hours. HbA1C: No results for input(s): HGBA1C in the last 72 hours. CBG: No results for input(s): GLUCAP in the last 168 hours. Lipid Profile: No results for input(s): CHOL, HDL, LDLCALC, TRIG, CHOLHDL, LDLDIRECT in the last 72 hours. Thyroid Function Tests: Recent Labs    12/10/19 1502  TSH 3.088   Anemia Panel: No results for input(s): VITAMINB12, FOLATE, FERRITIN, TIBC, IRON, RETICCTPCT in the last 72 hours. Sepsis Labs: No results for input(s): PROCALCITON, LATICACIDVEN in the last 168 hours.  Recent Results (from the past 240 hour(s))  SARS CORONAVIRUS 2 (TAT 6-24 HRS) Nasopharyngeal Nasopharyngeal Swab     Status: None   Collection Time: 12/08/19  9:31 AM   Specimen: Nasopharyngeal Swab  Result Value Ref Range Status   SARS Coronavirus 2 NEGATIVE NEGATIVE Final    Comment: (NOTE) SARS-CoV-2 target nucleic acids are NOT DETECTED. The  SARS-CoV-2 RNA is generally detectable in upper and lower respiratory specimens during the acute phase of infection. Negative results do not preclude SARS-CoV-2 infection, do not rule out co-infections with other pathogens, and should not be used as the sole basis for treatment or other patient management decisions. Negative results must be combined with clinical observations, patient history, and epidemiological information. The expected result is Negative. Fact Sheet for Patients: SugarRoll.be Fact Sheet for Healthcare Providers: https://www.woods-mathews.com/ This test is not yet approved or cleared by the [place] FDA and  has been authorized for detection and/or diagnosis of SARS-CoV-2 by FDA under an Emergency Use Authorization (EUA). This EUA will remain  in effect (meaning this test can be used) for the duration of the COVID-19 declaration under Section 56 4(b)(1) of the Act, 21 U.S.C. section 360bbb-3(b)(1), unless the authorization is terminated or revoked sooner. Performed at [facility], [place] [address], [place], [place] [address]          Radiology Studies: No results found.      Scheduled Meds: . sodium chloride   Intravenous Once  . atorvastatin  80 mg Oral QHS  . carvedilol  25 mg Oral BID WC  . pantoprazole  40 mg Oral Daily  . sacubitril-valsartan  1 tablet Oral BID  Continuous Infusions:   LOS: 4 days   Time spent= 25 mins    Emeri Estill Arsenio Loader, MD [facility]  If 7PM-7AM, please contact night-coverage  12/13/2019, 9:09 AM

## 2019-12-13 NOTE — Op Note (Signed)
[facility] Patient Name: Helen [name] Procedure Date: 12/13/2019 MRN: [id number] Attending MD: Lear Ng , MD Date of Birth: 1929/12/14 CSN: [id number] Age: 86 Admit Type: Inpatient Procedure:                Upper GI endoscopy Indications:              Iron deficiency anemia Providers:                Lear Ng, MD, Cleda Daub, RN,                            William Dalton, Technician Referring MD:             hospital team Medicines:                Propofol per Anesthesia, Monitored Anesthesia Care Complications:            No immediate complications. Estimated Blood Loss:     Estimated blood loss was minimal. Procedure:                Pre-Anesthesia Assessment:                           - Prior to the procedure, a History and Physical                            was performed, and patient medications and                            allergies were reviewed. The patient's tolerance of                            previous anesthesia was also reviewed. The risks                            and benefits of the procedure and the sedation                            options and risks were discussed with the patient.                            All questions were answered, and informed consent                            was obtained. Prior Anticoagulants: The patient has                            taken no previous anticoagulant or antiplatelet                            agents. ASA Grade Assessment: III - A patient with                            severe systemic disease. After reviewing the risks  and benefits, the patient was deemed in                            satisfactory condition to undergo the procedure.                           After obtaining informed consent, the endoscope was                            passed under direct vision. Throughout the                            procedure, the patient's blood pressure, pulse, and                             oxygen saturations were monitored continuously. The                            [id number]-[id number]) Olympus gastroscope was                            introduced through the mouth, and advanced to the                            second part of duodenum. The upper GI endoscopy was                            accomplished without difficulty. The patient                            tolerated the procedure well. Scope In: Scope Out: Findings:      The examined esophagus was normal.      The Z-line was regular and was found 40 cm from the incisors.      A single 10 mm pedunculated polyp with no bleeding and stigmata of       recent bleeding was found in the gastric body. The polyp was removed       with a hot snare. Resection and retrieval were complete Jabier Mutton net used).       Estimated blood loss: none.      Multiple medium sessile polyps with no bleeding and no stigmata of       recent bleeding were found in the gastric fundus and in the gastric body.      Segmental mild inflammation characterized by congestion (edema) and       erythema was found in the gastric antrum. Biopsies were taken with a       cold forceps for histology. Estimated blood loss was minimal.      The examined duodenum was normal. Biopsies for histology were taken with       a cold forceps for evaluation of celiac disease. Estimated blood loss       was minimal. Impression:               - Normal esophagus.                           -  Z-line regular, 40 cm from the incisors.                           - A single gastric polyp. Resected and retrieved.                           - Multiple gastric polyps.                           - Acute gastritis. Biopsied.                           - Normal examined duodenum. Biopsied. Moderate Sedation:      Not Applicable - Patient had care per Anesthesia. Recommendation:           - Patient has a contact number available for                             emergencies. The signs and symptoms of potential                            delayed complications were discussed with the                            patient. Return to normal activities tomorrow.                            Written discharge instructions were provided to the                            patient.                           - Resume previous diet.                           - Await pathology results. Procedure Code(s):        --- Professional ---                           [phone], Esophagogastroduodenoscopy, flexible,                            transoral; with removal of tumor(s), polyp(s), or                            other lesion(s) by snare technique Diagnosis Code(s):        --- Professional ---                           D50.9, Iron deficiency anemia, unspecified                           K31.7, Polyp of stomach and duodenum                           K29.00, Acute gastritis  without bleeding CPT copyright 2019 American Medical Association. All rights reserved. The codes documented in this report are preliminary and upon coder review may  be revised to meet current compliance requirements. Lear Ng, MD 12/13/2019 3:46:28 PM This report has been signed electronically. Number of Addenda: 0

## 2019-12-13 NOTE — Op Note (Signed)
[facility] Patient Name: Helen [name] Procedure Date: 12/13/2019 MRN: [id number] Attending MD: Lear Ng , MD Date of Birth: Nov 18, 1929 CSN: [id number] Age: 86 Admit Type: Inpatient Procedure:                Colonoscopy Indications:              Last colonoscopy: September 2013, Iron deficiency                            anemia Providers:                Lear Ng, MD, Cleda Daub, RN,                            William Dalton, Technician Referring MD:             hospital team Medicines:                Propofol per Anesthesia, Monitored Anesthesia Care Complications:            No immediate complications. Estimated Blood Loss:     Estimated blood loss: none. Procedure:                Pre-Anesthesia Assessment:                           - Prior to the procedure, a History and Physical                            was performed, and patient medications and                            allergies were reviewed. The patient's tolerance of                            previous anesthesia was also reviewed. The risks                            and benefits of the procedure and the sedation                            options and risks were discussed with the patient.                            All questions were answered, and informed consent                            was obtained. Prior Anticoagulants: The patient has                            taken no previous anticoagulant or antiplatelet                            agents. ASA Grade Assessment: III - A patient with  severe systemic disease. After reviewing the risks                            and benefits, the patient was deemed in                            satisfactory condition to undergo the procedure.                           After obtaining informed consent, the colonoscope                            was passed under direct vision. Throughout the   procedure, the patient's blood pressure, pulse, and                            oxygen saturations were monitored continuously. The                            [id number]-[id number] ([id number]) Olympus pediatric colonscope                            was introduced through the anus and advanced to the                            the cecum, identified by appendiceal orifice and                            ileocecal valve. Scope In: 2:59:54 PM Scope Out: 3:25:47 PM Scope Withdrawal Time: 0 hours 17 minutes 7 seconds  Total Procedure Duration: 0 hours 25 minutes 53 seconds  Findings:      The perianal and digital rectal examinations were normal.      A 12 mm polyp was found in the distal rectum. The polyp was       pedunculated. The polyp was removed with a hot snare. Resection and       retrieval were complete. Estimated blood loss: none.      Scattered small and large-mouthed diverticula were found in the sigmoid       colon.      Internal hemorrhoids were found during retroflexion. The hemorrhoids       were small and Grade I (internal hemorrhoids that do not prolapse). Impression:               - One 12 mm polyp in the distal rectum, removed                            with a hot snare. Resected and retrieved.                           - Diverticulosis in the sigmoid colon.                           - Internal hemorrhoids. Moderate Sedation:      N/A - MAC procedure Recommendation:           - Soft diet.                           -  Await pathology results.                           - Repeat colonoscopy for surveillance based on                            pathology results. Procedure Code(s):        --- Professional ---                           [phone], Colonoscopy, flexible; with removal of                            tumor(s), polyp(s), or other lesion(s) by snare                            technique Diagnosis Code(s):        --- Professional ---                           D50.9, Iron deficiency  anemia, unspecified                           K62.1, Rectal polyp                           K64.0, First degree hemorrhoids                           K57.30, Diverticulosis of large intestine without                            perforation or abscess without bleeding CPT copyright 2019 American Medical Association. All rights reserved. The codes documented in this report are preliminary and upon coder review may  be revised to meet current compliance requirements. Lear Ng, MD 12/13/2019 3:51:33 PM This report has been signed electronically. Number of Addenda: 0

## 2019-12-13 NOTE — Interval H&P Note (Signed)
History and Physical Interval Note:  12/13/2019 1:50 PM  Helen [name]  has presented today for surgery, with the diagnosis of iron deficiency anemia.  The various methods of treatment have been discussed with the patient and family. After consideration of risks, benefits and other options for treatment, the patient has consented to  Procedure(s): ESOPHAGOGASTRODUODENOSCOPY (EGD) (N/A) COLONOSCOPY WITH PROPOFOL (N/A) as a surgical intervention.  The patient's history has been reviewed, patient examined, no change in status, stable for surgery.  I have reviewed the patient's chart and labs.  Questions were answered to the patient's satisfaction.     Lear Ng

## 2019-12-13 NOTE — Anesthesia Preprocedure Evaluation (Addendum)
Anesthesia Evaluation  Patient identified by MRN, date of birth, ID band Patient awake    Reviewed: Allergy & Precautions, H&P , NPO status , Patient's Chart, lab work & pertinent test results, reviewed documented beta blocker date and time   Airway Mallampati: II  TM Distance: >3 FB Neck ROM: Full    Dental no notable dental hx. (+) Teeth Intact, Dental Advisory Given   Pulmonary neg pulmonary ROS, former smoker,    Pulmonary exam normal breath sounds clear to auscultation       Cardiovascular hypertension, Pt. on medications and Pt. on home beta blockers + Peripheral Vascular Disease and +CHF  negative cardio ROS Normal cardiovascular exam Rhythm:Regular Rate:Normal  TTE 2018 - Severe global reduction in LV systolic function; restrictivefilling; trace AI; mild MR; mild biatrial enlargement; mild RVE with mildly reduced RV function; mild TR with moderately elevated pulmonary pressure. EF 25-30%  Stress Test 2018 The left ventricular ejection fraction is moderately decreased (30-44%). Nuclear stress EF: 30%. There is a medium defect of moderate severity present in the basal inferior, mid inferior and apical inferior location. The defect is non-reversible and likely secondary to diaphragmatic attenuation artifact and extracardiac tracer uptake as there is no focal wall motion abnormality in this area. No ischemia noted. This is an intermediate risk study due to LV dysfunction. There was downsloping ST segment depression in the inferolateral leads at baseline with no change during infusion.   Neuro/Psych CVA negative psych ROS   GI/Hepatic negative GI ROS, Neg liver ROS,   Endo/Other  negative endocrine ROSdiabetes  Renal/GU negative Renal ROS  negative genitourinary   Musculoskeletal   Abdominal   Peds  Hematology  (+) Blood dyscrasia (Hgb 9.1, on eliquis), anemia ,   Anesthesia Other Findings   Reproductive/Obstetrics negative OB ROS                           Anesthesia Physical Anesthesia Plan  ASA: IV  Anesthesia Plan: MAC   Post-op Pain Management:    Induction: Intravenous  PONV Risk Score and Plan: 2 and Propofol infusion and Treatment may vary due to age or medical condition  Airway Management Planned: Nasal Cannula  Additional Equipment:   Intra-op Plan:   Post-operative Plan:   Informed Consent: I have reviewed the patients History and Physical, chart, labs and discussed the procedure including the risks, benefits and alternatives for the proposed anesthesia with the patient or authorized representative who has indicated his/her understanding and acceptance.   Patient has DNR.  Discussed DNR with patient and Suspend DNR.   Dental advisory given  Plan Discussed with: CRNA  Anesthesia Plan Comments:        Anesthesia Quick Evaluation

## 2019-12-13 NOTE — Anesthesia Procedure Notes (Signed)
Procedure Name: MAC Date/Time: 12/13/2019 2:30 PM Performed by: Eben Burow, CRNA Pre-anesthesia Checklist: Patient identified, Emergency Drugs available, Suction available, Patient being monitored and Timeout performed Oxygen Delivery Method: Simple face mask Dental Injury: Teeth and Oropharynx as per pre-operative assessment

## 2019-12-14 ENCOUNTER — Other Ambulatory Visit: Payer: Self-pay

## 2019-12-14 LAB — BASIC METABOLIC PANEL
Anion gap: 9 (ref 5–15)
BUN: 6 mg/dL — ABNORMAL LOW (ref 8–23)
CO2: 27 mmol/L (ref 22–32)
Calcium: 8.4 mg/dL — ABNORMAL LOW (ref 8.9–10.3)
Chloride: 95 mmol/L — ABNORMAL LOW (ref 98–111)
Creatinine, Ser: 0.68 mg/dL (ref 0.44–1.00)
GFR calc Af Amer: >60 mL/min (ref >60)
GFR calc non Af Amer: >60 mL/min (ref >60)
Glucose, Bld: 114 mg/dL — ABNORMAL HIGH (ref 70–99)
Potassium: 3.4 mmol/L — ABNORMAL LOW (ref 3.5–5.1)
Sodium: 131 mmol/L — ABNORMAL LOW (ref 135–145)

## 2019-12-14 LAB — CBC
HCT: 28.9 % — ABNORMAL LOW (ref 36.0–46.0)
Hemoglobin: 8.9 g/dL — ABNORMAL LOW (ref 12.0–15.0)
MCH: 27.4 pg (ref 26.0–34.0)
MCHC: 30.8 g/dL (ref 30.0–36.0)
MCV: 88.9 fL (ref 80.0–100.0)
Platelets: 186 10*3/uL (ref 150–400)
RBC: 3.25 MIL/uL — ABNORMAL LOW (ref 3.87–5.11)
RDW: 18.4 % — ABNORMAL HIGH (ref 11.5–15.5)
WBC: 9.8 10*3/uL (ref 4.0–10.5)
nRBC: 0 % (ref 0.0–0.2)

## 2019-12-14 LAB — MAGNESIUM: Magnesium: 2.1 mg/dL (ref 1.7–2.4)

## 2019-12-14 LAB — SURGICAL PATHOLOGY

## 2019-12-14 MED ORDER — SENNOSIDES-DOCUSATE SODIUM 8.6-50 MG PO TABS: 2.0000 | ORAL_TABLET | Freq: Every evening | ORAL | 0 refills | Status: AC | PRN

## 2019-12-14 MED ORDER — PANTOPRAZOLE SODIUM 40 MG PO TBEC: 40.0000 mg | DELAYED_RELEASE_TABLET | Freq: Two times a day (BID) | ORAL | 0 refills | Status: AC

## 2019-12-14 MED ORDER — POTASSIUM CHLORIDE CRYS ER 20 MEQ PO TBCR
40.0000 meq | EXTENDED_RELEASE_TABLET | Freq: Once | ORAL | Status: AC
  Administered 2019-12-14: 40 meq via ORAL
  Filled 2019-12-14: qty 2

## 2019-12-14 MED ORDER — FERROUS SULFATE 325 (65 FE) MG PO TABS: 325.0000 mg | ORAL_TABLET | Freq: Three times a day (TID) | ORAL | 0 refills | Status: DC

## 2019-12-14 NOTE — Discharge Summary (Signed)
Physician Discharge Summary  Kynzleigh Cavan [id number] DOB: [birth year] DOA: 12/08/2019  PCP: Vernie Shanks, MD  Admit date: 12/08/2019 Discharge date: 12/14/2019  Admitted From: Home Disposition: Home  Recommendations for Outpatient Follow-up:  1. Follow up with PCP in 1-2 weeks 2. Please obtain BMP/CBC in one week your next doctors visit.  3. PPI twice daily for 30 days followed by 40 mg daily before meal 4. Follow-up outpatient gastroenterology per their discretion 5. Resume Eliquis 6. Iron supplements with bowel regimen prescribed  Home Health: PT/OT/aide Equipment/Devices: None  Discharge Condition: Stable CODE STATUS: DNR Diet recommendation: Diabetic  Brief/Interim Summary: 86 year old female with history of chronic combined systolic and diastolic CHF, EF of [id number], history of CVA, type 2 diabetes mellitus, paroxysmal atrial fibrillation on Eliquis, hypertension, dyslipidemia presented to the ED with multiple complaints.  Admitted for hyponatremia and iron deficiency anemia.  Seen by GI planning for endoscopic evaluation 4/21.  Colonoscopy showed multiple diverticulosis, internal hemorrhoid and 12 mm polyp which was removed.  Endoscopy showed multiple gastric polyps which was also biopsied.  Recommended follow-up biopsy results by GI. Stable for discharge. PT/OT recommended home health therefore arrangements made  Son has been updated by me  Assessment & Plan:   Hyponatremia Greatly resolved.  Sodium levels are back to 131.  Recommend follow-up with outpatient PCP and obtain lab work in about 1-2 weeks.  Iron Deficiency anemia Currently Eliquis on hold.  Status post 1 unit PRBC transfusion Endoscopy/colonoscopy showed diverticulosis, internal hemorrhoid, gastric polyp.  Biopsies were sent which will be followed up by GI service. Iron supplements and bowel regimen has been ordered  Chronic combined systolic and diastolic CHF Resume home medications  Anxiety,  depression Resume home meds  Paroxysmal atrial fibrillation Continue Coreg Resume Eliquis  History of TIA Resume Eliquis   Discharge Diagnoses:  Active Problems:   Diabetes mellitus without complication (HCC)   Peripheral vascular disease (HCC)   Hyponatremia   Leukocytosis   Essential hypertension   Anemia of chronic disease   Chronic combined systolic and diastolic heart failure (HCC)   Nonischemic cardiomyopathy ([place])    Consultations:  GI  Subjective: Feels great no complaints  Discharge Exam: Vitals:   12/13/19 2323 12/14/19 0419  BP: (!) 153/49 (!) 153/57  Pulse: 66 64  Resp: 18 16  Temp: 97.7 F (36.5 C) 98.6 F (37 C)  SpO2: 97% (!) 89%   Vitals:   12/13/19 1611 12/13/19 2010 12/13/19 2323 12/14/19 0419  BP: (!) 175/68 (!) 171/60 (!) 153/49 (!) 153/57  Pulse: 65 70 66 64  Resp: 17 18 18 16   Temp:  97.9 F (36.6 C) 97.7 F (36.5 C) 98.6 F (37 C)  TempSrc:  Oral Oral Oral  SpO2: 96% 95% 97% (!) 89%  Weight:      Height:        General: Pt is alert, awake, not in acute distress Cardiovascular: RRR, S1/S2 +, no rubs, no gallops Respiratory: CTA bilaterally, no wheezing, no rhonchi Abdominal: Soft, NT, ND, bowel sounds + Extremities: no edema, no cyanosis  Discharge Instructions   Allergies as of 12/14/2019      Reactions   Morphine And Related Nausea And Vomiting   Other Other (See Comments)   Other reaction(s): Other (See Comments) Barley & Carrots: learned through allergy testing. Unknown reaction-- MD said not very allergic but not to eat large quantities Barley & Carrots: learned through allergy testing. Unknown reaction-- MD said not very allergic but not to eat  large quantities   Sulfa Antibiotics Hives   Sulfasalazine Hives   Nickel Rash      Medication List    TAKE these medications   ALPRAZolam 0.5 MG tablet Commonly known as: XANAX Take 0.25 mg by mouth in the morning and at bedtime.   apixaban 5 MG Tabs  tablet Commonly known as: Eliquis Take 1 tablet (5 mg total) by mouth 2 (two) times daily.   atorvastatin 80 MG tablet Commonly known as: LIPITOR Take 80 mg by mouth at bedtime.   BIOTIN PO Take 1 tablet by mouth daily.   bismuth subsalicylate 99991111 MG chewable tablet Commonly known as: PEPTO BISMOL Chew 524 mg by mouth as needed for indigestion or diarrhea or loose stools.   carvedilol 25 MG tablet Commonly known as: COREG Take 1 tablet (25 mg total) by mouth 2 (two) times daily with a meal.   cholecalciferol 1000 units tablet Commonly known as: VITAMIN D Take 1,000 Units by mouth every evening.   citalopram 20 MG tablet Commonly known as: CELEXA Take 20 mg by mouth daily.   Entresto 49-51 MG Generic drug: sacubitril-valsartan TAKE 1 TABLET BY MOUTH  TWICE DAILY   ferrous sulfate 325 (65 FE) MG tablet Take 1 tablet (325 mg total) by mouth 3 (three) times daily with meals.   furosemide 20 MG tablet Commonly known as: LASIX TAKE 1 TABLET BY MOUTH  DAILY   magnesium oxide 400 MG tablet Commonly known as: MAG-OX 1 tablet by mouth twice a day for 1 week and then 1 daily What changed:   how much to take  how to take this  when to take this  additional instructions   meclizine 25 MG tablet Commonly known as: ANTIVERT TAKE ONE TABLET BY MOUTH THREE TIMES A DAY AS NEEDED FOR DIZZINESS   multivitamin with minerals Tabs tablet Take 1 tablet by mouth daily.   pantoprazole 40 MG tablet Commonly known as: PROTONIX Take 1 tablet (40 mg total) by mouth 2 (two) times daily before a meal. What changed: when to take this   senna-docusate 8.6-50 MG tablet Commonly known as: Senokot-S Take 2 tablets by mouth at bedtime as needed for mild constipation or moderate constipation.   vitamin C 500 MG tablet Commonly known as: ASCORBIC ACID Take 500 mg by mouth daily.      Follow-up Information    Vernie Shanks, MD. Schedule an appointment as soon as possible for a  visit in 1 week(s).   Specialty: Family Medicine Contact information: [place] [address] [phone]        Skeet Latch, MD .   Specialty: Cardiology Contact information: [address] [place] [address] [phone]          Allergies  Allergen Reactions  . Morphine And Related Nausea And Vomiting  . Other Other (See Comments)    Other reaction(s): Other (See Comments) Barley & Carrots: learned through allergy testing. Unknown reaction-- MD said not very allergic but not to eat large quantities Barley & Carrots: learned through allergy testing. Unknown reaction-- MD said not very allergic but not to eat large quantities  . Sulfa Antibiotics Hives  . Sulfasalazine Hives  . Nickel Rash    You were cared for by a hospitalist during your hospital stay. If you have any questions about your discharge medications or the care you received while you were in the hospital after you are discharged, you can call the unit and asked  to speak with the hospitalist on call if the hospitalist that took care of you is not available. Once you are discharged, your primary care physician will handle any further medical issues. Please note that no refills for any discharge medications will be authorized once you are discharged, as it is imperative that you return to your primary care physician (or establish a relationship with a primary care physician if you do not have one) for your aftercare needs so that they can reassess your need for medications and monitor your lab values.   Procedures/Studies:  No results found.   The results of significant diagnostics from this hospitalization (including imaging, microbiology, ancillary and laboratory) are listed below for reference.     Microbiology: Recent Results (from the past 240 hour(s))  SARS CORONAVIRUS 2 (TAT 6-24 HRS) Nasopharyngeal Nasopharyngeal Swab     Status: None   Collection Time: 12/08/19   9:31 AM   Specimen: Nasopharyngeal Swab  Result Value Ref Range Status   SARS Coronavirus 2 NEGATIVE NEGATIVE Final    Comment: (NOTE) SARS-CoV-2 target nucleic acids are NOT DETECTED. The SARS-CoV-2 RNA is generally detectable in upper and lower respiratory specimens during the acute phase of infection. Negative results do not preclude SARS-CoV-2 infection, do not rule out co-infections with other pathogens, and should not be used as the sole basis for treatment or other patient management decisions. Negative results must be combined with clinical observations, patient history, and epidemiological information. The expected result is Negative. Fact Sheet for Patients: SugarRoll.be Fact Sheet for Healthcare Providers: https://www.woods-mathews.com/ This test is not yet approved or cleared by the [place] FDA and  has been authorized for detection and/or diagnosis of SARS-CoV-2 by FDA under an Emergency Use Authorization (EUA). This EUA will remain  in effect (meaning this test can be used) for the duration of the COVID-19 declaration under Section 56 4(b)(1) of the Act, 21 U.S.C. section 360bbb-3(b)(1), unless the authorization is terminated or revoked sooner. Performed at [facility], [place] [address] [place], [place] [address]      Labs: BNP (last 3 results) No results for input(s): BNP in the last 8760 hours. Basic Metabolic Panel: Recent Labs  Lab 12/12/19 0555 12/12/19 1242 12/12/19 1841 12/13/19 0041 12/14/19 0531  NA 128* 129* 129* 129* 131*  K 3.5 3.7 3.7 4.0 3.4*  CL 92* 91* 91* 90* 95*  CO2 28 29 27 30 27   GLUCOSE 97 137* 152* 110* 114*  BUN 8 9 7* 7* 6*  CREATININE 0.80 0.86 0.80 0.76 0.68  CALCIUM 8.4* 8.5* 8.8* 8.9 8.4*  MG  --   --   --   --  2.1   Liver Function Tests: No results for input(s): AST, ALT, ALKPHOS, BILITOT, PROT, ALBUMIN in the last 168 hours. No results for input(s): LIPASE, AMYLASE in  the last 168 hours. No results for input(s): AMMONIA in the last 168 hours. CBC: Recent Labs  Lab 12/10/19 0607 12/11/19 0537 12/12/19 0555 12/13/19 0041 12/14/19 0531  WBC 9.3 8.2 8.7 10.1 9.8  HGB 7.1* 8.3* 8.8* 9.1* 8.9*  HCT 23.1* 26.6* 27.5* 28.6* 28.9*  MCV 86.5 85.5 86.2 86.1 88.9  PLT 199 185 189 195 186   Cardiac Enzymes: No results for input(s): CKTOTAL, CKMB, CKMBINDEX, TROPONINI in the last 168 hours. BNP: Invalid input(s): POCBNP CBG: Recent Labs  Lab 12/13/19 1356  GLUCAP 131*   D-Dimer No results for input(s): DDIMER in the last 72 hours. Hgb A1c No results for  input(s): HGBA1C in the last 72 hours. Lipid Profile No results for input(s): CHOL, HDL, LDLCALC, TRIG, CHOLHDL, LDLDIRECT in the last 72 hours. Thyroid function studies Recent Labs    12/13/19 1004  TSH 2.751   Anemia work up No results for input(s): VITAMINB12, FOLATE, FERRITIN, TIBC, IRON, RETICCTPCT in the last 72 hours. Urinalysis    Component Value Date/Time   COLORURINE YELLOW 11/12/2016 0105   APPEARANCEUR Clear 03/12/2017 1440   LABSPEC 1.015 11/12/2016 0105   PHURINE 5.0 11/12/2016 0105   GLUCOSEU Negative 03/12/2017 1440   HGBUR NEGATIVE 11/12/2016 0105   BILIRUBINUR Negative 03/12/2017 1440   KETONESUR NEGATIVE 11/12/2016 0105   PROTEINUR Negative 03/12/2017 1440   PROTEINUR 100 (A) 11/12/2016 0105   NITRITE Negative 03/12/2017 1440   NITRITE NEGATIVE 11/12/2016 0105   LEUKOCYTESUR 2+ (A) 03/12/2017 1440   Sepsis Labs Invalid input(s): PROCALCITONIN,  WBC,  LACTICIDVEN Microbiology Recent Results (from the past 240 hour(s))  SARS CORONAVIRUS 2 (TAT 6-24 HRS) Nasopharyngeal Nasopharyngeal Swab     Status: None   Collection Time: 12/08/19  9:31 AM   Specimen: Nasopharyngeal Swab  Result Value Ref Range Status   SARS Coronavirus 2 NEGATIVE NEGATIVE Final    Comment: (NOTE) SARS-CoV-2 target nucleic acids are NOT DETECTED. The SARS-CoV-2 RNA is generally detectable in  upper and lower respiratory specimens during the acute phase of infection. Negative results do not preclude SARS-CoV-2 infection, do not rule out co-infections with other pathogens, and should not be used as the sole basis for treatment or other patient management decisions. Negative results must be combined with clinical observations, patient history, and epidemiological information. The expected result is Negative. Fact Sheet for Patients: SugarRoll.be Fact Sheet for Healthcare Providers: https://www.woods-mathews.com/ This test is not yet approved or cleared by the [place] FDA and  has been authorized for detection and/or diagnosis of SARS-CoV-2 by FDA under an Emergency Use Authorization (EUA). This EUA will remain  in effect (meaning this test can be used) for the duration of the COVID-19 declaration under Section 56 4(b)(1) of the Act, 21 U.S.C. section 360bbb-3(b)(1), unless the authorization is terminated or revoked sooner. Performed at [facility], [place] [address], [place], [place] [address]      Time coordinating discharge:  I have spent 35 minutes face to face with the patient and on the ward discussing the patients care, assessment, plan and disposition with other care givers. >50% of the time was devoted counseling the patient about the risks and benefits of treatment/Discharge disposition and coordinating care.   SIGNED:   Damita Lack, MD  [facility] 12/14/2019, 10:01 AM   If 7PM-7AM, please contact night-coverage

## 2019-12-14 NOTE — Progress Notes (Signed)
PT Cancellation Note  Patient Details Name: Helen [name] MRN: [id number] DOB: [birth year]   Cancelled Treatment:    Reason Eval/Treat Not Completed: Fatigue/lethargy limiting ability to participate(pt stated she is tired from multiple people coming in her room. She'd like to rest right now. Noted plan to DC home later today.)   Philomena Doheny PT 12/14/2019  Acute Rehabilitation Services Pager [phone] Office [phone]

## 2019-12-14 NOTE — TOC Initial Note (Signed)
Transition of Care [facility]) - Initial/Assessment Note    Patient Details  Name: Helen [name] MRN: [id number] Date of Birth: [birth year]  [facility] CM/SW Contact:    Lynnell Catalan, RN Phone Number: 12/14/2019, 11:32 AM  Clinical Narrative:                 This CM spoke with pt at bedside for dc planning. She is from [place] independent living. Pt states that [employer] is the company that provides PT/OT services on site at her independent living. This CM contacted [facility] liaison and faxed orders for PT/OT/aide services.   Expected Discharge Plan: Home/Self Care Barriers to Discharge: No Barriers Identified   Expected Discharge Plan and Services Expected Discharge Plan: Home/Self Care       Living arrangements for the past 2 months: [place] Expected Discharge Date: 12/14/19                         HH Arranged: PT, OT, Nurse's Aide          Prior Living Arrangements/Services Living arrangements for the past 2 months: [place] Lives with:: Self, Facility Resident Patient language and need for interpreter reviewed:: Yes Do you feel safe going back to the place where you live?: Yes        Care giver support system in place?: Yes (comment)      Activities of Daily Living Home Assistive Devices/Equipment: Grab bars in shower, Grab bars around toilet, Hand-held shower hose, Eyeglasses, Walker (specify type), Blood pressure cuff, Scales(4 wheeled walker) ADL Screening (condition at time of admission) Patient's cognitive ability adequate to safely complete daily activities?: Yes Is the patient deaf or have difficulty hearing?: No Does the patient have difficulty seeing, even when wearing glasses/contacts?: No Does the patient have difficulty concentrating, remembering, or making decisions?: No Patient able to express need for assistance with ADLs?: Yes Does the patient have difficulty dressing or bathing?:  No Independently performs ADLs?: No Communication: Independent Dressing (OT): Needs assistance Is this a change from baseline?: Pre-admission baseline Grooming: Needs assistance Is this a change from baseline?: Pre-admission baseline Feeding: Needs assistance Is this a change from baseline?: Pre-admission baseline Bathing: Needs assistance Is this a change from baseline?: Pre-admission baseline Toileting: Needs assistance Is this a change from baseline?: Pre-admission baseline In/Out Bed: Needs assistance Is this a change from baseline?: Pre-admission baseline Walks in Home: Needs assistance Is this a change from baseline?: Pre-admission baseline Does the patient have difficulty walking or climbing stairs?: Yes(secondary to shortness of breath) Weakness of Legs: Both Weakness of Arms/Hands: None  Permission Sought/Granted         Permission granted to share info w AGENCY: [facility]        Emotional Assessment Appearance:: Appears stated age     Orientation: : Oriented to Self, Oriented to Place, Oriented to  Time, Oriented to Situation      Admission diagnosis:  Hyponatremia [E87.1] Nausea [R11.0] Patient Active Problem List   Diagnosis Date Noted  . Nonischemic cardiomyopathy ([place]) 12/08/2016  . SOB (shortness of breath) 11/12/2016  . Shortness of breath 11/11/2016  . Chronic respiratory failure with hypoxia ([place]) 11/11/2016  . Anemia of chronic disease 03/24/2016  . Chronic combined systolic and diastolic heart failure ([place]) 03/24/2016  . Acute respiratory failure with hypoxia ([place]) 03/24/2016  . Scarring of lung following radiation- RLL 03/24/2016  . Essential hypertension   . Acute encephalopathy 12/13/2015  .  Hyponatremia 12/13/2015  . Hypokalemia 12/13/2015  . UTI (lower urinary tract infection) 12/13/2015  . Leukocytosis 12/13/2015  . Hypercholesteremia   . Diabetes mellitus without complication ([place])   . History of stroke   . Peripheral vascular disease  ([place])   . Carotid stenosis 03/13/2015   PCP:  Vernie Shanks, MD Pharmacy:   [facility] [address], [place] [address] Phone: [phone] Fax: [phone]  [place] [address] [facility] [address] [place] [address] [place] [address] Phone: [phone] Fax: [phone]     Social Determinants of Health ([place]) Interventions    Readmission Risk Interventions No flowsheet data found.

## 2019-12-14 NOTE — Progress Notes (Signed)
[facility] Progress Note  Helen [name] 86 y.o. 11/14/1929  CC:  anemia  Subjective: Patient reports feeling "weary" today.  Notes that she has not been getting good sleep.  She denies any abdominal pain, nausea, or vomiting.  She denies shortness of breath or chest pain.  She denies any hematochezia.  Notes some darker stools after iron supplementation.  ROS : Review of Systems  Constitutional: Positive for malaise/fatigue. Negative for chills and fever.  Respiratory: Negative for cough and shortness of breath.   Cardiovascular: Negative for chest pain.  Gastrointestinal: Negative for abdominal pain, blood in stool, constipation, diarrhea, heartburn, melena, nausea and vomiting.    Objective: Vital signs in last 24 hours: Vitals:   12/13/19 2323 12/14/19 0419  BP: (!) 153/49 (!) 153/57  Pulse: 66 64  Resp: 18 16  Temp: 97.7 F (36.5 C) 98.6 F (37 C)  SpO2: 97% (!) 89%    Physical Exam:  General:  Sleeping but arouses easily to voice alone, cooperative, no acute distress  Head:  Normocephalic, without obvious abnormality, atraumatic  Eyes:  Anicteric sclera, EOM's intact  Lungs:   Clear to auscultation bilaterally, respirations unlabored  Heart:  Regular rate and rhythm, S1, S2 normal  Abdomen:   Soft, non-tender, non-distended, normoactive bowel sounds, no peritoneal signs   Extremities: Extremities normal, atraumatic, no  edema  Pulses: 2+ and symmetric    Lab Results: Recent Labs    12/13/19 0041 12/14/19 0531  NA 129* 131*  K 4.0 3.4*  CL 90* 95*  CO2 30 27  GLUCOSE 110* 114*  BUN 7* 6*  CREATININE 0.76 0.68  CALCIUM 8.9 8.4*  MG  --  2.1   No results for input(s): AST, ALT, ALKPHOS, BILITOT, PROT, ALBUMIN in the last 72 hours. Recent Labs    12/13/19 0041 12/14/19 0531  WBC 10.1 9.8  HGB 9.1* 8.9*  HCT 28.6* 28.9*  MCV 86.1 88.9  PLT 195 186   No results for input(s): LABPROT, INR in the last 72 hours.  Assessment/Plan: Iron  deficiency anemia in the setting of Eliquis use, improving. Hgb improved from admission.  Stable at 8.9 today, as compared to 7.8 on arrival.  Patient underwent EGD and colonoscopy yesterday.  EGD was pertinent for: -A single 10 mm pedunculated polyp with no bleeding and stigmata of recent bleeding was found in the gastric body. The polyp was removed with a hot snare ([id number] gland polyp with ischemic changes; no H.pylori intestinal metaplasia, or malignancy) -Multiple medium sessile polyps with no bleeding and no stigmata of recent bleeding were found in the gastric fundus and in the gastric body. -Segmental mild inflammation characterized by congestion (edema) and erythema was found in the gastric antrum, biopsied (bx: reactive gastropathy, no H. Pylori or intestinal metaplasia. -The examined duodenum was normal. Biopsies for evaluation of celiac disease (bx: benign mucosa, no inflammation, villous blunting, or intraepithelial lymphocytes).   Colonoscopy was pertinent for: - One 12 mm pedunculated polyp in the distal rectum, removed with a hot snare (bx: tubular adenoma, no high grade dysplasia or malignancy) - Diverticulosis in the sigmoid colon. - Internal hemorrhoids.  OK to discharge patient from a GI standpoint.  Patient to follow up with Dr. Cristina Gong in 4-6 weeks.  Continue PPI as noted in discharge instructions (twice daily for 30 days followed by 40 mg daily before meal).  [facility] will sign off. Please contact us if we can be of any further assistance during this hospital stay.  Salley Slaughter PA-C 12/14/2019, 10:54 AM  Contact #  [phone]

## 2019-12-14 NOTE — Anesthesia Postprocedure Evaluation (Signed)
Anesthesia Post Note  Patient: Helen [name]  Procedure(s) Performed: ESOPHAGOGASTRODUODENOSCOPY (EGD) (N/A ) COLONOSCOPY WITH PROPOFOL (N/A ) BIOPSY POLYPECTOMY     Patient location during evaluation: Endoscopy Anesthesia Type: MAC Level of consciousness: awake and alert Pain management: pain level controlled Vital Signs Assessment: post-procedure vital signs reviewed and stable Respiratory status: spontaneous breathing, nonlabored ventilation, respiratory function stable and patient connected to nasal cannula oxygen Cardiovascular status: blood pressure returned to baseline and stable Postop Assessment: no apparent nausea or vomiting Anesthetic complications: no    Last Vitals:  Vitals:   12/14/19 0419 12/14/19 1349  BP: (!) 153/57 100/62  Pulse: 64 70  Resp: 16 16  Temp: 37 C 36.4 C  SpO2: (!) 89% 94%    Last Pain:  Vitals:   12/14/19 1349  TempSrc: Oral  PainSc:                  Tylor Courtwright L Sara Keys

## 2019-12-14 NOTE — Progress Notes (Signed)
Occupational Therapy Treatment Patient Details Name: Peyten Deangelo MRN: [id number] DOB: [birth year] Today's Date: 12/14/2019    History of present illness 86 y.o. female with medical history significant for chronic combined systolic and diastolic congestive heart failure with EF 25 to 30%, diet-controlled type 2 diabetes, history of stroke, anemia, hypertension,  hypercholesterolemia, on Eliquis being admitted to the hospital with anemia and hyponatremia.   OT comments  Patient very pleasant and agreeable. Reports she plans on hiring help for around the house as needed. Patient supervision level for functional transfers and sink side grooming/hygiene with min cues for safety navigating with walker in room. Progressing towards goals.   Follow Up Recommendations  Home health OT;Supervision/Assistance - 24 hour    Equipment Recommendations  None recommended by OT       Precautions / Restrictions Precautions Precautions: Fall Precaution Comments: dizzy standing at times; wears O2 at night at baseline  Restrictions Weight Bearing Restrictions: No       Mobility Bed Mobility Overal bed mobility: Modified Independent Bed Mobility: Supine to Sit;Sit to Supine     Supine to sit: HOB elevated Sit to supine: HOB elevated      Transfers Overall transfer level: Needs assistance Equipment used: Rolling walker (2 wheeled) Transfers: Sit to/from Stand Sit to Stand: Supervision         General transfer comment: min cues for hand placement    Balance Overall balance assessment: Needs assistance Sitting-balance support: Feet supported;No upper extremity supported Sitting balance-Leahy Scale: Good     Standing balance support: Bilateral upper extremity supported;During functional activity Standing balance-Leahy Scale: Poor                             ADL either performed or assessed with clinical judgement   ADL Overall ADL's : Needs assistance/impaired      Grooming: Wash/dry face;Wash/dry hands;Oral care;Supervision/safety;Standing                   Toilet Transfer: Supervision/safety;Regular Materials engineer Details (indicate cue type and reason): simulated with functional mobility, no assist to power up to standing         Functional mobility during ADLs: Supervision/safety;Rolling walker General ADL Comments: min cue for safety managing rolling walker with functional ambulation               Cognition Arousal/Alertness: Awake/alert Behavior During Therapy: WFL for tasks assessed/performed Overall Cognitive Status: Within Functional Limits for tasks assessed                                                     Pertinent Vitals/ Pain       Pain Assessment: No/denies pain         Frequency  Min 2X/week        Progress Toward Goals  OT Goals(current goals can now be found in the care plan section)  Progress towards OT goals: Progressing toward goals  Acute Rehab OT Goals Patient Stated Goal: to get back home OT Goal Formulation: With patient Time For Goal Achievement: 12/16/19 Potential to Achieve Goals: Good ADL Goals Pt Will Perform Lower Body Dressing: with modified independence;sit to/from stand Pt Will Transfer to Toilet: with modified independence;regular height toilet;ambulating Pt Will Perform Toileting - Clothing Manipulation and hygiene: with modified  independence;sit to/from stand  Plan Discharge plan remains appropriate       AM-PAC OT "6 Clicks" Daily Activity     Outcome Measure   Help from another person eating meals?: None Help from another person taking care of personal grooming?: A Little Help from another person toileting, which includes using toliet, bedpan, or urinal?: A Little Help from another person bathing (including washing, rinsing, drying)?: A Little Help from another person to put on and taking off regular upper body clothing?: A Little Help  from another person to put on and taking off regular lower body clothing?: A Little 6 Click Score: 19    End of Session Equipment Utilized During Treatment: Rolling walker  OT Visit Diagnosis: Unsteadiness on feet (R26.81);History of falling (Z91.81);Muscle weakness (generalized) (M62.81);Repeated falls (R29.6);Other abnormalities of gait and mobility (R26.89)   Activity Tolerance Patient tolerated treatment well   Patient Left in bed;with call bell/phone within reach;with bed alarm set           Time: [id number] OT Time Calculation (min): 18 min  Charges: OT General Charges $OT Visit: 1 Visit OT Treatments $Self Care/Home Management : 8-22 mins  Delbert Phenix OT Pager: [place] 12/14/2019, 12:20 PM

## 2019-12-15 ENCOUNTER — Encounter: Payer: Self-pay | Admitting: *Deleted

## 2019-12-18 DIAGNOSIS — R2681 Unsteadiness on feet: Secondary | ICD-10-CM | POA: Diagnosis not present

## 2019-12-18 DIAGNOSIS — R262 Difficulty in walking, not elsewhere classified: Secondary | ICD-10-CM | POA: Diagnosis not present

## 2019-12-18 DIAGNOSIS — M6281 Muscle weakness (generalized): Secondary | ICD-10-CM | POA: Diagnosis not present

## 2019-12-19 DIAGNOSIS — E1122 Type 2 diabetes mellitus with diabetic chronic kidney disease: Secondary | ICD-10-CM | POA: Diagnosis not present

## 2019-12-19 DIAGNOSIS — L989 Disorder of the skin and subcutaneous tissue, unspecified: Secondary | ICD-10-CM | POA: Diagnosis not present

## 2019-12-19 DIAGNOSIS — N183 Chronic kidney disease, stage 3 unspecified: Secondary | ICD-10-CM | POA: Diagnosis not present

## 2019-12-19 DIAGNOSIS — D649 Anemia, unspecified: Secondary | ICD-10-CM | POA: Diagnosis not present

## 2019-12-19 DIAGNOSIS — Z09 Encounter for follow-up examination after completed treatment for conditions other than malignant neoplasm: Secondary | ICD-10-CM | POA: Diagnosis not present

## 2019-12-19 DIAGNOSIS — E871 Hypo-osmolality and hyponatremia: Secondary | ICD-10-CM | POA: Diagnosis not present

## 2019-12-20 DIAGNOSIS — M6281 Muscle weakness (generalized): Secondary | ICD-10-CM | POA: Diagnosis not present

## 2019-12-20 DIAGNOSIS — R262 Difficulty in walking, not elsewhere classified: Secondary | ICD-10-CM | POA: Diagnosis not present

## 2019-12-20 DIAGNOSIS — R2681 Unsteadiness on feet: Secondary | ICD-10-CM | POA: Diagnosis not present

## 2019-12-26 DIAGNOSIS — R2681 Unsteadiness on feet: Secondary | ICD-10-CM | POA: Diagnosis not present

## 2019-12-26 DIAGNOSIS — M6281 Muscle weakness (generalized): Secondary | ICD-10-CM | POA: Diagnosis not present

## 2019-12-26 DIAGNOSIS — R262 Difficulty in walking, not elsewhere classified: Secondary | ICD-10-CM | POA: Diagnosis not present

## 2019-12-27 DIAGNOSIS — R2681 Unsteadiness on feet: Secondary | ICD-10-CM | POA: Diagnosis not present

## 2019-12-27 DIAGNOSIS — M6281 Muscle weakness (generalized): Secondary | ICD-10-CM | POA: Diagnosis not present

## 2019-12-27 DIAGNOSIS — R262 Difficulty in walking, not elsewhere classified: Secondary | ICD-10-CM | POA: Diagnosis not present

## 2019-12-28 DIAGNOSIS — R2681 Unsteadiness on feet: Secondary | ICD-10-CM | POA: Diagnosis not present

## 2019-12-28 DIAGNOSIS — M6281 Muscle weakness (generalized): Secondary | ICD-10-CM | POA: Diagnosis not present

## 2019-12-28 DIAGNOSIS — R262 Difficulty in walking, not elsewhere classified: Secondary | ICD-10-CM | POA: Diagnosis not present

## 2019-12-29 DIAGNOSIS — M6281 Muscle weakness (generalized): Secondary | ICD-10-CM | POA: Diagnosis not present

## 2019-12-29 DIAGNOSIS — R262 Difficulty in walking, not elsewhere classified: Secondary | ICD-10-CM | POA: Diagnosis not present

## 2019-12-29 DIAGNOSIS — R2681 Unsteadiness on feet: Secondary | ICD-10-CM | POA: Diagnosis not present

## 2020-01-01 DIAGNOSIS — M6281 Muscle weakness (generalized): Secondary | ICD-10-CM | POA: Diagnosis not present

## 2020-01-01 DIAGNOSIS — R2681 Unsteadiness on feet: Secondary | ICD-10-CM | POA: Diagnosis not present

## 2020-01-01 DIAGNOSIS — R262 Difficulty in walking, not elsewhere classified: Secondary | ICD-10-CM | POA: Diagnosis not present

## 2020-01-03 DIAGNOSIS — R2681 Unsteadiness on feet: Secondary | ICD-10-CM | POA: Diagnosis not present

## 2020-01-03 DIAGNOSIS — M6281 Muscle weakness (generalized): Secondary | ICD-10-CM | POA: Diagnosis not present

## 2020-01-03 DIAGNOSIS — R262 Difficulty in walking, not elsewhere classified: Secondary | ICD-10-CM | POA: Diagnosis not present

## 2020-01-04 ENCOUNTER — Other Ambulatory Visit: Payer: Self-pay | Admitting: Cardiovascular Disease

## 2020-01-04 DIAGNOSIS — R262 Difficulty in walking, not elsewhere classified: Secondary | ICD-10-CM | POA: Diagnosis not present

## 2020-01-04 DIAGNOSIS — R2681 Unsteadiness on feet: Secondary | ICD-10-CM | POA: Diagnosis not present

## 2020-01-04 DIAGNOSIS — M6281 Muscle weakness (generalized): Secondary | ICD-10-CM | POA: Diagnosis not present

## 2020-01-08 DIAGNOSIS — R2681 Unsteadiness on feet: Secondary | ICD-10-CM | POA: Diagnosis not present

## 2020-01-08 DIAGNOSIS — R262 Difficulty in walking, not elsewhere classified: Secondary | ICD-10-CM | POA: Diagnosis not present

## 2020-01-08 DIAGNOSIS — M6281 Muscle weakness (generalized): Secondary | ICD-10-CM | POA: Diagnosis not present

## 2020-01-09 ENCOUNTER — Telehealth: Payer: Self-pay | Admitting: Cardiovascular Disease

## 2020-01-09 DIAGNOSIS — R2681 Unsteadiness on feet: Secondary | ICD-10-CM | POA: Diagnosis not present

## 2020-01-09 DIAGNOSIS — R262 Difficulty in walking, not elsewhere classified: Secondary | ICD-10-CM | POA: Diagnosis not present

## 2020-01-09 DIAGNOSIS — M6281 Muscle weakness (generalized): Secondary | ICD-10-CM | POA: Diagnosis not present

## 2020-01-09 MED ORDER — APIXABAN 5 MG PO TABS: 5.0000 mg | ORAL_TABLET | Freq: Two times a day (BID) | ORAL | 1 refills | Status: DC

## 2020-01-09 NOTE — Telephone Encounter (Signed)
Refilled Eliquis as requested.

## 2020-01-09 NOTE — Telephone Encounter (Signed)
Pt c/o medication issue:  1. Name of Medication: apixaban (ELIQUIS) 5 MG TABS tablet  2. How are you currently taking this medication (dosage and times per day)? As directed  3. Are you having a reaction (difficulty breathing--STAT)? no  4. What is your medication issue? Patient states she got a letter in the mail from [facility] stating that the order for eliquis had been cancelled with the cancellation number [id number]. She does not know why the order would be cancelled. Please advise.

## 2020-01-10 DIAGNOSIS — R2681 Unsteadiness on feet: Secondary | ICD-10-CM | POA: Diagnosis not present

## 2020-01-10 DIAGNOSIS — M6281 Muscle weakness (generalized): Secondary | ICD-10-CM | POA: Diagnosis not present

## 2020-01-10 DIAGNOSIS — R262 Difficulty in walking, not elsewhere classified: Secondary | ICD-10-CM | POA: Diagnosis not present

## 2020-01-11 DIAGNOSIS — M6281 Muscle weakness (generalized): Secondary | ICD-10-CM | POA: Diagnosis not present

## 2020-01-11 DIAGNOSIS — R262 Difficulty in walking, not elsewhere classified: Secondary | ICD-10-CM | POA: Diagnosis not present

## 2020-01-11 DIAGNOSIS — R2681 Unsteadiness on feet: Secondary | ICD-10-CM | POA: Diagnosis not present

## 2020-01-15 DIAGNOSIS — R2681 Unsteadiness on feet: Secondary | ICD-10-CM | POA: Diagnosis not present

## 2020-01-15 DIAGNOSIS — R262 Difficulty in walking, not elsewhere classified: Secondary | ICD-10-CM | POA: Diagnosis not present

## 2020-01-15 DIAGNOSIS — M6281 Muscle weakness (generalized): Secondary | ICD-10-CM | POA: Diagnosis not present

## 2020-01-16 DIAGNOSIS — R262 Difficulty in walking, not elsewhere classified: Secondary | ICD-10-CM | POA: Diagnosis not present

## 2020-01-16 DIAGNOSIS — M6281 Muscle weakness (generalized): Secondary | ICD-10-CM | POA: Diagnosis not present

## 2020-01-16 DIAGNOSIS — R2681 Unsteadiness on feet: Secondary | ICD-10-CM | POA: Diagnosis not present

## 2020-01-18 DIAGNOSIS — E1122 Type 2 diabetes mellitus with diabetic chronic kidney disease: Secondary | ICD-10-CM | POA: Diagnosis not present

## 2020-01-18 DIAGNOSIS — D649 Anemia, unspecified: Secondary | ICD-10-CM | POA: Diagnosis not present

## 2020-01-18 DIAGNOSIS — E871 Hypo-osmolality and hyponatremia: Secondary | ICD-10-CM | POA: Diagnosis not present

## 2020-01-19 DIAGNOSIS — M6281 Muscle weakness (generalized): Secondary | ICD-10-CM | POA: Diagnosis not present

## 2020-01-19 DIAGNOSIS — R262 Difficulty in walking, not elsewhere classified: Secondary | ICD-10-CM | POA: Diagnosis not present

## 2020-01-19 DIAGNOSIS — R2681 Unsteadiness on feet: Secondary | ICD-10-CM | POA: Diagnosis not present

## 2020-02-12 ENCOUNTER — Other Ambulatory Visit: Payer: Self-pay | Admitting: Cardiovascular Disease

## 2020-02-12 MED ORDER — CARVEDILOL 25 MG PO TABS: 25.0000 mg | ORAL_TABLET | Freq: Two times a day (BID) | ORAL | 0 refills | Status: DC

## 2020-02-12 NOTE — Telephone Encounter (Signed)
*  STAT* If patient is at the pharmacy, call can be transferred to refill team.   1. Which medications need to be refilled? (please list name of each medication and dose if known)  carvedilol (COREG) 25 MG tablet  2. Which pharmacy/location (including street and city if local pharmacy) is medication to be sent to? [facility] [address] [place]  3. Do they need a 30 day or 90 day supply? TWO WEEK SUPPLY  [facility] did not send on time she only has one pill left.

## 2020-02-16 ENCOUNTER — Other Ambulatory Visit: Payer: Self-pay | Admitting: Cardiovascular Disease

## 2020-02-22 DIAGNOSIS — R262 Difficulty in walking, not elsewhere classified: Secondary | ICD-10-CM | POA: Diagnosis not present

## 2020-02-22 DIAGNOSIS — R2681 Unsteadiness on feet: Secondary | ICD-10-CM | POA: Diagnosis not present

## 2020-02-22 DIAGNOSIS — M6281 Muscle weakness (generalized): Secondary | ICD-10-CM | POA: Diagnosis not present

## 2020-02-27 DIAGNOSIS — R2681 Unsteadiness on feet: Secondary | ICD-10-CM | POA: Diagnosis not present

## 2020-02-27 DIAGNOSIS — R262 Difficulty in walking, not elsewhere classified: Secondary | ICD-10-CM | POA: Diagnosis not present

## 2020-02-27 DIAGNOSIS — M6281 Muscle weakness (generalized): Secondary | ICD-10-CM | POA: Diagnosis not present

## 2020-02-28 DIAGNOSIS — M6281 Muscle weakness (generalized): Secondary | ICD-10-CM | POA: Diagnosis not present

## 2020-02-28 DIAGNOSIS — R2681 Unsteadiness on feet: Secondary | ICD-10-CM | POA: Diagnosis not present

## 2020-02-28 DIAGNOSIS — R262 Difficulty in walking, not elsewhere classified: Secondary | ICD-10-CM | POA: Diagnosis not present

## 2020-03-04 DIAGNOSIS — R262 Difficulty in walking, not elsewhere classified: Secondary | ICD-10-CM | POA: Diagnosis not present

## 2020-03-04 DIAGNOSIS — R2681 Unsteadiness on feet: Secondary | ICD-10-CM | POA: Diagnosis not present

## 2020-03-04 DIAGNOSIS — M6281 Muscle weakness (generalized): Secondary | ICD-10-CM | POA: Diagnosis not present

## 2020-03-11 DIAGNOSIS — R2681 Unsteadiness on feet: Secondary | ICD-10-CM | POA: Diagnosis not present

## 2020-03-11 DIAGNOSIS — M6281 Muscle weakness (generalized): Secondary | ICD-10-CM | POA: Diagnosis not present

## 2020-03-11 DIAGNOSIS — R262 Difficulty in walking, not elsewhere classified: Secondary | ICD-10-CM | POA: Diagnosis not present

## 2020-03-12 DIAGNOSIS — M6281 Muscle weakness (generalized): Secondary | ICD-10-CM | POA: Diagnosis not present

## 2020-03-12 DIAGNOSIS — R262 Difficulty in walking, not elsewhere classified: Secondary | ICD-10-CM | POA: Diagnosis not present

## 2020-03-12 DIAGNOSIS — R2681 Unsteadiness on feet: Secondary | ICD-10-CM | POA: Diagnosis not present

## 2020-03-14 DIAGNOSIS — M6281 Muscle weakness (generalized): Secondary | ICD-10-CM | POA: Diagnosis not present

## 2020-03-14 DIAGNOSIS — R2681 Unsteadiness on feet: Secondary | ICD-10-CM | POA: Diagnosis not present

## 2020-03-14 DIAGNOSIS — R262 Difficulty in walking, not elsewhere classified: Secondary | ICD-10-CM | POA: Diagnosis not present

## 2020-03-18 DIAGNOSIS — R262 Difficulty in walking, not elsewhere classified: Secondary | ICD-10-CM | POA: Diagnosis not present

## 2020-03-18 DIAGNOSIS — M6281 Muscle weakness (generalized): Secondary | ICD-10-CM | POA: Diagnosis not present

## 2020-03-18 DIAGNOSIS — R2681 Unsteadiness on feet: Secondary | ICD-10-CM | POA: Diagnosis not present

## 2020-03-19 DIAGNOSIS — E871 Hypo-osmolality and hyponatremia: Secondary | ICD-10-CM | POA: Diagnosis not present

## 2020-03-19 DIAGNOSIS — I5042 Chronic combined systolic (congestive) and diastolic (congestive) heart failure: Secondary | ICD-10-CM | POA: Diagnosis not present

## 2020-03-19 DIAGNOSIS — E782 Mixed hyperlipidemia: Secondary | ICD-10-CM | POA: Diagnosis not present

## 2020-03-19 DIAGNOSIS — Z7901 Long term (current) use of anticoagulants: Secondary | ICD-10-CM | POA: Diagnosis not present

## 2020-03-19 DIAGNOSIS — M6281 Muscle weakness (generalized): Secondary | ICD-10-CM | POA: Diagnosis not present

## 2020-03-19 DIAGNOSIS — I10 Essential (primary) hypertension: Secondary | ICD-10-CM | POA: Diagnosis not present

## 2020-03-19 DIAGNOSIS — Z853 Personal history of malignant neoplasm of breast: Secondary | ICD-10-CM | POA: Diagnosis not present

## 2020-03-19 DIAGNOSIS — E1122 Type 2 diabetes mellitus with diabetic chronic kidney disease: Secondary | ICD-10-CM | POA: Diagnosis not present

## 2020-03-19 DIAGNOSIS — D649 Anemia, unspecified: Secondary | ICD-10-CM | POA: Diagnosis not present

## 2020-03-19 DIAGNOSIS — R2681 Unsteadiness on feet: Secondary | ICD-10-CM | POA: Diagnosis not present

## 2020-03-19 DIAGNOSIS — R262 Difficulty in walking, not elsewhere classified: Secondary | ICD-10-CM | POA: Diagnosis not present

## 2020-03-21 DIAGNOSIS — M6281 Muscle weakness (generalized): Secondary | ICD-10-CM | POA: Diagnosis not present

## 2020-03-21 DIAGNOSIS — R262 Difficulty in walking, not elsewhere classified: Secondary | ICD-10-CM | POA: Diagnosis not present

## 2020-03-21 DIAGNOSIS — R2681 Unsteadiness on feet: Secondary | ICD-10-CM | POA: Diagnosis not present

## 2020-03-26 ENCOUNTER — Other Ambulatory Visit: Payer: Self-pay | Admitting: Cardiovascular Disease

## 2020-04-23 DIAGNOSIS — I10 Essential (primary) hypertension: Secondary | ICD-10-CM | POA: Diagnosis not present

## 2020-04-23 DIAGNOSIS — N183 Chronic kidney disease, stage 3 unspecified: Secondary | ICD-10-CM | POA: Diagnosis not present

## 2020-04-23 DIAGNOSIS — D649 Anemia, unspecified: Secondary | ICD-10-CM | POA: Diagnosis not present

## 2020-04-23 DIAGNOSIS — I5032 Chronic diastolic (congestive) heart failure: Secondary | ICD-10-CM | POA: Diagnosis not present

## 2020-04-23 DIAGNOSIS — I509 Heart failure, unspecified: Secondary | ICD-10-CM | POA: Diagnosis not present

## 2020-04-23 DIAGNOSIS — I272 Pulmonary hypertension, unspecified: Secondary | ICD-10-CM | POA: Diagnosis not present

## 2020-04-23 DIAGNOSIS — I5042 Chronic combined systolic (congestive) and diastolic (congestive) heart failure: Secondary | ICD-10-CM | POA: Diagnosis not present

## 2020-04-23 DIAGNOSIS — E119 Type 2 diabetes mellitus without complications: Secondary | ICD-10-CM | POA: Diagnosis not present

## 2020-04-23 DIAGNOSIS — E1122 Type 2 diabetes mellitus with diabetic chronic kidney disease: Secondary | ICD-10-CM | POA: Diagnosis not present

## 2020-04-23 DIAGNOSIS — E782 Mixed hyperlipidemia: Secondary | ICD-10-CM | POA: Diagnosis not present

## 2020-04-23 DIAGNOSIS — I639 Cerebral infarction, unspecified: Secondary | ICD-10-CM | POA: Diagnosis not present

## 2020-04-23 DIAGNOSIS — G459 Transient cerebral ischemic attack, unspecified: Secondary | ICD-10-CM | POA: Diagnosis not present

## 2020-05-01 ENCOUNTER — Encounter: Payer: Self-pay | Admitting: Cardiovascular Disease

## 2020-05-01 ENCOUNTER — Other Ambulatory Visit: Payer: Self-pay

## 2020-05-01 ENCOUNTER — Ambulatory Visit (INDEPENDENT_AMBULATORY_CARE_PROVIDER_SITE_OTHER): Payer: Medicare Other | Admitting: Cardiovascular Disease

## 2020-05-01 VITALS — BP 126/56 | Ht 63.0 in | Wt 144.6 lb

## 2020-05-01 DIAGNOSIS — I480 Paroxysmal atrial fibrillation: Secondary | ICD-10-CM

## 2020-05-01 DIAGNOSIS — I6523 Occlusion and stenosis of bilateral carotid arteries: Secondary | ICD-10-CM

## 2020-05-01 DIAGNOSIS — I428 Other cardiomyopathies: Secondary | ICD-10-CM

## 2020-05-01 DIAGNOSIS — E871 Hypo-osmolality and hyponatremia: Secondary | ICD-10-CM | POA: Diagnosis not present

## 2020-05-01 DIAGNOSIS — I779 Disorder of arteries and arterioles, unspecified: Secondary | ICD-10-CM | POA: Diagnosis not present

## 2020-05-01 DIAGNOSIS — I10 Essential (primary) hypertension: Secondary | ICD-10-CM | POA: Diagnosis not present

## 2020-05-01 DIAGNOSIS — I5042 Chronic combined systolic (congestive) and diastolic (congestive) heart failure: Secondary | ICD-10-CM | POA: Diagnosis not present

## 2020-05-01 NOTE — Patient Instructions (Signed)
Medication Instructions:  Your physician recommends that you continue on your current medications as directed. Please refer to the Current Medication list given to you today.  *If you need a refill on your cardiac medications before your next appointment, please call your pharmacy*  Lab Work: BMET TODAY   If you have labs (blood work) drawn today and your tests are completely normal, you will receive your results only by: [name] MyChart Message (if you have MyChart) OR . A paper copy in the mail If you have any lab test that is abnormal or we need to change your treatment, we will call you to review the results.  Testing/Procedures: Your physician has requested that you have a carotid duplex. This test is an ultrasound of the carotid arteries in your neck. It looks at blood flow through these arteries that supply the brain with blood. Allow one hour for this exam. There are no restrictions or special instructions.  Follow-Up: At [facility], you and your health needs are our priority.  As part of our continuing mission to provide you with exceptional heart care, we have created designated Provider Care Teams.  These Care Teams include your primary Cardiologist (physician) and Advanced Practice Providers (APPs -  Physician Assistants and Nurse Practitioners) who all work together to provide you with the care you need, when you need it.  We recommend signing up for the patient portal called "MyChart".  Sign up information is provided on this After Visit Summary.  MyChart is used to connect with patients for Virtual Visits (Telemedicine).  Patients are able to view lab/test results, encounter notes, upcoming appointments, etc.  Non-urgent messages can be sent to your provider as well.   To learn more about what you can do with MyChart, go to [email or web address].    Your next appointment:   6 month(s)  The format for your next appointment:   In Person  Provider:   You may see Skeet Latch, MD or one of the following Advanced Practice Providers on your designated Care Team:    [name], PA-C  [place], [place]  Coletta Memos, [place]

## 2020-05-01 NOTE — Progress Notes (Addendum)
Cardiology Office Note   Date:  05/01/2020   ID:  Helen [name], DOB [birth year], MRN [id number]  PCP:  Vernie Shanks, MD  Cardiologist:   Skeet Latch, MD   No chief complaint on file.    History of Present Illness: Helen [name] is a 86 y.o. female with chronic systolic and diastolic heart failure, paroxysmal atrial fibrillation, hypertension, hyperlipidemia, diabetes, prior stroke, CAD, carotid stenosis and breast cancer s/p chemotherapy and double mastectomy who presents for follow up on heart failure.  Helen [name] was admitted 02/2016 with hyponatremia.  That hospitalization she had an echo that revealed LVEF LVEF 40-45% with global hypokinesis worse in the basal-mid anteroseptal and inferoseptal regions, moderately elevated pulmonary pressures, and grade 2 diastolic dysfunction.  She was not evaluated by cardiology at the time.  Since then she has remained on supplemental oxygen.  BNP was noted to be 1500, So she was referred to cardiology for evaluation 10/08/16. EKG was notable for diffuse T wave inversions. She was referred for [facility] 10/16/16 that revealed LVEF 30% with a fixed defect in the inferior wall felt to be diaphragmatic attenuation.  She was admitted 10/2016 with heart failure and acute on chronic respiratory failure. BNP was 1300. Cardiology was not consulted. She followed up with [name], PA-C on 12/08/16 and was referred for repeat echocardiogram. Echo revealed LVEF 25-30% with diffuse hypokinesis and elevated left ventricular filling pressures. She also had moderate pulmonary hypertension.  She was started on Entresto and had improvement in edema.    At her last appointment Helen [name] reported intermittent palpitations over the last few weeks.  She wondered if it may be due to stress and anxiety.  Helen [name] wore an event monitor 02/2018 that revealed PAF.  Metoprolol was increased.  She was seen in atrial for ablation clinic and seem to be doing better.  She  sometimes feels her heart beating when she is lying in bed at night.  It seems to improve when she gets up and sits in a chair for a while.  She was also started on Eliquis.  Since that time she has noted several symptoms that she attributes to the Eliquis.  Although she has had chronic dizziness it seems to be worse.  She is also felt pruritus and tongue swelling.  She had a couple days of rectal bleeding but was find to have hemorrhoids.  This has resolved.  She is most concerned about her dizziness.  She feels so off balance when she walks that she has to hold onto the walls.  She uses a walker she is not walking with her daughter.  She has seen several ENTs and has been told that nothing is wrong.  She reports chronic tinnitus.  The dizziness is constantly there but seems to be worse when she walks.  She notices that overall her blood pressure is controlled but it seems to be higher whenever she is stressed or anxious.  Helen [name] reported dizziness tht she attributed to Eliquis.  She switched to Xarelto but had no improvement so she switched back.  She was admitted 11/2019 with hyponatremia and iron deficiency anemia.  She was seen by GI and underwent colonoscopy that revealed diverticulosis and internal hemorrhoids with a 12 mm polyp.  The polyp was removed.  She also had multiple gastric polyps on upper endoscopy that were biopsied.  Eliquis was initially held and then resumed.  Her sodium was down to 125 on admission.  Lasix was held and she received gentle diuresis with an improvement in her sodium back to her baseline level of hyponatremia.  She notes taht she has been feeling better than she has in a long time.  She has been mostly staying in her room.  She has occasional bloating in her stomach but no LE edema. She has trouble sleeping sometimes.  When this happens she gets up and watches TV and tries to go back to bed.  She tries to walk around her complex at least once per day and sometimes has to stop  and rest, though not as much as she had to stop in the past.  She has a son who suffered from a stroke.  His personality has changed but he is otherwise recovering well.  Her daughter has lung cancer.  She has 8 children in total.    Past Medical History:  Diagnosis Date  . Anemia   . Cancer [facility])    Breast cancer  . Chronic combined systolic (congestive) and diastolic (congestive) heart failure (HCC)    a. EF 25-30% by echo in 11/2016. Recent NST showing no inducible ischemia --> therefore thought to be most consistent with nonischemic cardiomyopathy.   . Diabetes mellitus   . History of stroke 11/2016   incidental finding on MRI  . Hypercholesteremia   . Hypertension   . Peripheral vascular disease ([place]) 2015   bilateral moderate CA disease-Dr Bridgett Larsson    Past Surgical History:  Procedure Laterality Date  . ABDOMINAL HYSTERECTOMY    . APPENDECTOMY  1951  . BIOPSY  12/13/2019   Procedure: BIOPSY;  Surgeon: Wilford Corner, MD;  Location: [facility];  Service: Endoscopy;;  . [place]  . CHOLECYSTECTOMY  2003   By Dr. Excell Seltzer  . COLONOSCOPY WITH PROPOFOL N/A 12/13/2019   Procedure: COLONOSCOPY WITH PROPOFOL;  Surgeon: Wilford Corner, MD;  [facility];  Service: Endoscopy;  Laterality: N/A;  . ESOPHAGOGASTRODUODENOSCOPY N/A 12/13/2019   Procedure: ESOPHAGOGASTRODUODENOSCOPY (EGD);  Surgeon: Wilford Corner, MD;  Location: [facility];  Service: Endoscopy;  Laterality: N/A;  . EYE SURGERY Bilateral 2009   Cataract surgery  . FRACTURE SURGERY Right    ORIF   by Dr. Burney Gauze  . HUMERUS FRACTURE SURGERY    . MASTECTOMY Bilateral   . NASAL SEPTUM SURGERY  1977   Dr. Ernesto Rutherford  . POLYPECTOMY  12/13/2019   Procedure: POLYPECTOMY;  Surgeon: Wilford Corner, MD;  [facility];  Service: Endoscopy;;  . TOENAIL EXCISION       Current Outpatient Medications  Medication Sig Dispense Refill  . apixaban (ELIQUIS) 5 MG TABS tablet Take 1  tablet (5 mg total) by mouth 2 (two) times daily. 180 tablet 1  . atorvastatin (LIPITOR) 80 MG tablet Take 80 mg by mouth at bedtime.    [name] BIOTIN PO Take 1 tablet by mouth daily.    [name] bismuth subsalicylate (PEPTO BISMOL) 262 MG chewable tablet Chew 524 mg by mouth as needed for indigestion or diarrhea or loose stools.    . carvedilol (COREG) 25 MG tablet Take 1 tablet (25 mg total) by mouth 2 (two) times daily with a meal. 28 tablet 0  . cholecalciferol (VITAMIN D) 1000 units tablet Take 1,000 Units by mouth every evening.     . citalopram (CELEXA) 20 MG tablet Take 20 mg by mouth daily.    [name] ENTRESTO 49-51 MG TAKE 1 TABLET BY MOUTH  TWICE DAILY 180 tablet 3  . furosemide (  LASIX) 20 MG tablet TAKE 1 TABLET BY MOUTH  DAILY 90 tablet 3  . magnesium oxide (MAG-OX) 400 MG tablet 1 tablet by mouth twice a day for 1 week and then 1 daily (Patient taking differently: Take 400 mg by mouth daily. ) 40 tablet 3  . Multiple Vitamin (MULTIVITAMIN WITH MINERALS) TABS tablet Take 1 tablet by mouth daily.     . pantoprazole (PROTONIX) 40 MG tablet Take 1 tablet (40 mg total) by mouth 2 (two) times daily before a meal. 60 tablet 0  . senna-docusate (SENOKOT-S) 8.6-50 MG tablet Take 2 tablets by mouth at bedtime as needed for mild constipation or moderate constipation. 60 tablet 0  . vitamin C (ASCORBIC ACID) 500 MG tablet Take 500 mg by mouth daily.      No current facility-administered medications for this visit.    Allergies:   Morphine and related, Other, Sulfa antibiotics, Sulfasalazine, and Nickel    Social History:  The patient  reports that she quit smoking about 41 years ago. She has never used smokeless tobacco. She reports that she does not drink alcohol and does not use drugs.   Family History:  The patient's family history includes Cancer in her mother; Diabetes in her brother; Heart attack in her father; Heart disease in her father; Hyperlipidemia in her brother; Kidney disease in her brother;  Pulmonary embolism in her mother; Stroke in her brother and father.    ROS:  Please see the history of present illness.   Otherwise, review of systems are positive for none.   All other systems are reviewed and negative.    PHYSICAL EXAM: VS:  BP (!) 126/56   Ht 5\' 3"  (1.6 m)   Wt 144 lb 9.6 oz (65.6 kg)   SpO2 99%   BMI 25.61 kg/m  , BMI Body mass index is 25.61 kg/m. GENERAL:  Well appearing HEENT: Pupils equal round and reactive, fundi not visualized, oral mucosa unremarkable NECK:  No jugular venous distention, waveform within normal limits, carotid upstroke brisk and symmetric, no bruits LUNGS:  Clear to auscultation bilaterally HEART:  RRR.  PMI not displaced or sustained,S1 and S2 within normal limits, no S3, no S4, no clicks, no rubs, II/VI systolic murmur at the LUSB ABD:  Flat, positive bowel sounds normal in frequency in pitch, no bruits, no rebound, no guarding, no midline pulsatile mass, no hepatomegaly, no splenomegaly EXT:  2 plus pulses throughout, no edema, no cyanosis no clubbing SKIN:  No rashes no nodules NEURO:  Cranial nerves II through XII grossly intact, motor grossly intact throughout PSYCH:  Cognitively intact, oriented to person place and time   EKG:  EKG is ordered today. The ekg ordered 10/08/16 demonstrates sinus rhythm rate 76 bpm.  PVCs.  Inferolateral TWI 07/30/17: Sinus rhythm.  Rate 83 bpm.  First degree AV block.  PACs.  Non-specific T wave abnormalities.   03/09/2018: Sinus rhythm.  Rate 85 bpm.  First-degree AV block.  Low voltage precordial leads. 05/01/20: Sinus bradycardia.  Sinus arrhythmia.  Rate 58 bpm.  First degree heart block.  12/21/16: Study Conclusions  - Left ventricle: The cavity size was normal. Wall thickness was   increased in a pattern of mild LVH. Systolic function was   severely reduced. The estimated ejection fraction was in the   range of 25% to 30%. Diffuse hypokinesis. Doppler parameters are   consistent with restrictive  physiology, indicative of decreased   left ventricular diastolic compliance and/or increased left  atrial pressure. Doppler parameters are consistent with high   ventricular filling pressure. - Aortic valve: Valve mobility was restricted. There was trivial   regurgitation. - Mitral valve: Calcified annulus. Mildly thickened leaflets .   There was mild regurgitation. - Left atrium: The atrium was mildly dilated. - Right ventricle: The cavity size was mildly dilated. Systolic   function was mildly reduced. - Right atrium: The atrium was mildly dilated. - Pulmonary arteries: Systolic pressure was moderately increased.   PA peak pressure: 53 mm Hg (S). - Pericardium, extracardiac: A trivial pericardial effusion was   identified.  Carotid Doppler 12/15/15 and 11/06/16:  R and L ICA 1-39%.  L ECA >50%  Lexiscan Myoview 10/16/16:  The left ventricular ejection fraction is moderately decreased (30-44%).  Nuclear stress EF: 30%.  There is a medium defect of moderate severity present in the basal inferior, mid inferior and apical inferior location. The defect is non-reversible and likely secondary to diaphragmatic attenuation artifact and extracardiac tracer uptake as there is no focal wall motion abnormality in this area. No ischemia noted.  This is an intermediate risk study due to LV dysfunction.  There was downsloping ST segment depression in the inferolateral leads at baseline with no change during infusion.  7 Day Event Monitor 03/17/18: Quality: Fair.  Baseline artifact. Predominant rhythm: sinus rhythm Average heart rate: 82 bpm Max heart rate: 125 bpm Min heart rate: 57 bpm  Occasional PACs and PVCs 9 beats of NSVT Atrial fibrillation ventricular rate 70 bpm.  Recent Labs: 12/13/2019: TSH 2.751 12/14/2019: BUN 6; Creatinine, Ser 0.68; Hemoglobin 8.9; Magnesium 2.1; Platelets 186; Potassium 3.4; Sodium 131   09/07/16:  BNP 1500 Sodium 137, potassium 4.4, BUN 13, creatinine  0.78 AST 19, ALT 25 WBC 10.1, hemoglobin 13.1, hematocrit 39.7, platelets 240  05/12/18: Total cholesterol 168, triglycerides 244, HDL 33, LDL 86  03/21/2020: WBC 8.5, hemoglobin 12.2, hematocrit 35.2, platelets 175    Lipid Panel    Component Value Date/Time   CHOL 84 11/16/2016 0521   TRIG 91 11/16/2016 0521   HDL 18 (L) 11/16/2016 0521   CHOLHDL 4.7 11/16/2016 0521   VLDL 18 11/16/2016 0521   LDLCALC 48 11/16/2016 0521      Wt Readings from Last 3 Encounters:  05/01/20 144 lb 9.6 oz (65.6 kg)  12/13/19 158 lb 4.6 oz (71.8 kg)  02/17/19 148 lb (67.1 kg)      ASSESSMENT AND PLAN:  # Chronic systolic and diastolic heart failure:  # Hypertension:  LVEF 25-30%. She is euvolemic. No ischemia on Lexiscan Myoview.  She is doing very well on Entresto.  Continue metoprolol and furosemide.  She was admitted earlier this year with hypovolemic hyponatremia.  We will check a BMP today.  She may be getting too dehydrated on both Lasix and Entresto.  # PACs: # Atrial bigeminy: # PAF: Well controlled on metoprolol.  Continue Eliquis.   # Carotid stenosis: # Prior TIA/CVA:  Helen [name] has moderate carotid stenosis.  Repeat carotid Dopplers.  Continue Eliquis and atorvastatin.   Current medicines are reviewed at length with the patient today.  The patient does not have concerns regarding medicines.  The following changes have been made:  None  Labs/ tests ordered today include:   Orders Placed This Encounter  Procedures  . Basic metabolic panel  . EKG 12-Lead  . VAS US CAROTID     Disposition:   FU with Juliah Scadden C. Oval Linsey, MD, [facility] in 1 year.  APP in 6  months     Signed, Leelan Rajewski C. Oval Linsey, MD, [facility]  05/01/2020 12:28 PM   think [place]

## 2020-05-02 LAB — BASIC METABOLIC PANEL
BUN/Creatinine Ratio: 18 (ref 12–28)
BUN: 18 mg/dL (ref 8–27)
CO2: 28 mmol/L (ref 20–29)
Calcium: 9.4 mg/dL (ref 8.7–10.3)
Chloride: 95 mmol/L — ABNORMAL LOW (ref 96–106)
Creatinine, Ser: 0.98 mg/dL (ref 0.57–1.00)
GFR calc Af Amer: 60 mL/min/{1.73_m2} (ref >59)
GFR calc non Af Amer: 52 mL/min/{1.73_m2} — ABNORMAL LOW (ref >59)
Glucose: 106 mg/dL — ABNORMAL HIGH (ref 65–99)
Potassium: 5.1 mmol/L (ref 3.5–5.2)
Sodium: 138 mmol/L (ref 134–144)

## 2020-05-08 ENCOUNTER — Encounter (HOSPITAL_COMMUNITY): Payer: Medicare Other

## 2020-05-15 ENCOUNTER — Encounter (HOSPITAL_COMMUNITY): Payer: Medicare Other

## 2020-05-16 ENCOUNTER — Other Ambulatory Visit: Payer: Self-pay

## 2020-05-16 ENCOUNTER — Ambulatory Visit (HOSPITAL_COMMUNITY)
Admission: RE | Admit: 2020-05-16 | Discharge: 2020-05-16 | Disposition: A | Discharge status: Home / Self Care | Payer: Medicare Other | Source: Ambulatory Visit | Attending: Cardiology | Admitting: Cardiology

## 2020-05-16 DIAGNOSIS — I779 Disorder of arteries and arterioles, unspecified: Secondary | ICD-10-CM | POA: Diagnosis not present

## 2020-05-17 DIAGNOSIS — Z23 Encounter for immunization: Secondary | ICD-10-CM | POA: Diagnosis not present

## 2020-05-23 ENCOUNTER — Other Ambulatory Visit: Payer: Self-pay | Admitting: *Deleted

## 2020-05-23 DIAGNOSIS — I6523 Occlusion and stenosis of bilateral carotid arteries: Secondary | ICD-10-CM

## 2020-05-23 NOTE — Addendum Note (Signed)
Addended by: Alvina Filbert B on: 05/23/2020 08:30 AM   Modules accepted: Orders

## 2020-06-18 ENCOUNTER — Other Ambulatory Visit: Payer: Self-pay | Admitting: Cardiovascular Disease

## 2020-07-04 DIAGNOSIS — I509 Heart failure, unspecified: Secondary | ICD-10-CM | POA: Diagnosis not present

## 2020-07-04 DIAGNOSIS — E119 Type 2 diabetes mellitus without complications: Secondary | ICD-10-CM | POA: Diagnosis not present

## 2020-07-04 DIAGNOSIS — I639 Cerebral infarction, unspecified: Secondary | ICD-10-CM | POA: Diagnosis not present

## 2020-07-04 DIAGNOSIS — Z853 Personal history of malignant neoplasm of breast: Secondary | ICD-10-CM | POA: Diagnosis not present

## 2020-07-04 DIAGNOSIS — I5032 Chronic diastolic (congestive) heart failure: Secondary | ICD-10-CM | POA: Diagnosis not present

## 2020-07-04 DIAGNOSIS — G459 Transient cerebral ischemic attack, unspecified: Secondary | ICD-10-CM | POA: Diagnosis not present

## 2020-07-04 DIAGNOSIS — D649 Anemia, unspecified: Secondary | ICD-10-CM | POA: Diagnosis not present

## 2020-07-04 DIAGNOSIS — E782 Mixed hyperlipidemia: Secondary | ICD-10-CM | POA: Diagnosis not present

## 2020-07-04 DIAGNOSIS — N183 Chronic kidney disease, stage 3 unspecified: Secondary | ICD-10-CM | POA: Diagnosis not present

## 2020-07-04 DIAGNOSIS — E1122 Type 2 diabetes mellitus with diabetic chronic kidney disease: Secondary | ICD-10-CM | POA: Diagnosis not present

## 2020-07-04 DIAGNOSIS — I10 Essential (primary) hypertension: Secondary | ICD-10-CM | POA: Diagnosis not present

## 2020-07-04 DIAGNOSIS — I5042 Chronic combined systolic (congestive) and diastolic (congestive) heart failure: Secondary | ICD-10-CM | POA: Diagnosis not present

## 2020-07-12 DIAGNOSIS — Z8673 Personal history of transient ischemic attack (TIA), and cerebral infarction without residual deficits: Secondary | ICD-10-CM | POA: Diagnosis not present

## 2020-07-12 DIAGNOSIS — I5042 Chronic combined systolic (congestive) and diastolic (congestive) heart failure: Secondary | ICD-10-CM | POA: Diagnosis not present

## 2020-07-12 DIAGNOSIS — I509 Heart failure, unspecified: Secondary | ICD-10-CM | POA: Diagnosis not present

## 2020-07-12 DIAGNOSIS — I10 Essential (primary) hypertension: Secondary | ICD-10-CM | POA: Diagnosis not present

## 2020-07-12 DIAGNOSIS — F5101 Primary insomnia: Secondary | ICD-10-CM | POA: Diagnosis not present

## 2020-07-12 DIAGNOSIS — E1122 Type 2 diabetes mellitus with diabetic chronic kidney disease: Secondary | ICD-10-CM | POA: Diagnosis not present

## 2020-07-12 DIAGNOSIS — D649 Anemia, unspecified: Secondary | ICD-10-CM | POA: Diagnosis not present

## 2020-07-12 DIAGNOSIS — Z853 Personal history of malignant neoplasm of breast: Secondary | ICD-10-CM | POA: Diagnosis not present

## 2020-07-12 DIAGNOSIS — E782 Mixed hyperlipidemia: Secondary | ICD-10-CM | POA: Diagnosis not present

## 2020-07-12 DIAGNOSIS — N183 Chronic kidney disease, stage 3 unspecified: Secondary | ICD-10-CM | POA: Diagnosis not present

## 2020-07-22 DIAGNOSIS — D0461 Carcinoma in situ of skin of right upper limb, including shoulder: Secondary | ICD-10-CM | POA: Diagnosis not present

## 2020-07-22 DIAGNOSIS — D485 Neoplasm of uncertain behavior of skin: Secondary | ICD-10-CM | POA: Diagnosis not present

## 2020-08-02 ENCOUNTER — Other Ambulatory Visit: Payer: Self-pay | Admitting: Cardiovascular Disease

## 2020-08-14 DIAGNOSIS — I5042 Chronic combined systolic (congestive) and diastolic (congestive) heart failure: Secondary | ICD-10-CM | POA: Diagnosis not present

## 2020-08-14 DIAGNOSIS — I5032 Chronic diastolic (congestive) heart failure: Secondary | ICD-10-CM | POA: Diagnosis not present

## 2020-08-14 DIAGNOSIS — N183 Chronic kidney disease, stage 3 unspecified: Secondary | ICD-10-CM | POA: Diagnosis not present

## 2020-08-14 DIAGNOSIS — I509 Heart failure, unspecified: Secondary | ICD-10-CM | POA: Diagnosis not present

## 2020-08-14 DIAGNOSIS — E1122 Type 2 diabetes mellitus with diabetic chronic kidney disease: Secondary | ICD-10-CM | POA: Diagnosis not present

## 2020-08-14 DIAGNOSIS — Z853 Personal history of malignant neoplasm of breast: Secondary | ICD-10-CM | POA: Diagnosis not present

## 2020-08-14 DIAGNOSIS — D649 Anemia, unspecified: Secondary | ICD-10-CM | POA: Diagnosis not present

## 2020-08-14 DIAGNOSIS — I10 Essential (primary) hypertension: Secondary | ICD-10-CM | POA: Diagnosis not present

## 2020-08-14 DIAGNOSIS — I639 Cerebral infarction, unspecified: Secondary | ICD-10-CM | POA: Diagnosis not present

## 2020-08-14 DIAGNOSIS — G459 Transient cerebral ischemic attack, unspecified: Secondary | ICD-10-CM | POA: Diagnosis not present

## 2020-08-14 DIAGNOSIS — E782 Mixed hyperlipidemia: Secondary | ICD-10-CM | POA: Diagnosis not present

## 2020-10-14 DIAGNOSIS — I5042 Chronic combined systolic (congestive) and diastolic (congestive) heart failure: Secondary | ICD-10-CM | POA: Diagnosis not present

## 2020-10-14 DIAGNOSIS — I10 Essential (primary) hypertension: Secondary | ICD-10-CM | POA: Diagnosis not present

## 2020-10-14 DIAGNOSIS — I509 Heart failure, unspecified: Secondary | ICD-10-CM | POA: Diagnosis not present

## 2020-10-14 DIAGNOSIS — Z853 Personal history of malignant neoplasm of breast: Secondary | ICD-10-CM | POA: Diagnosis not present

## 2020-10-14 DIAGNOSIS — I5032 Chronic diastolic (congestive) heart failure: Secondary | ICD-10-CM | POA: Diagnosis not present

## 2020-10-14 DIAGNOSIS — D649 Anemia, unspecified: Secondary | ICD-10-CM | POA: Diagnosis not present

## 2020-10-14 DIAGNOSIS — N183 Chronic kidney disease, stage 3 unspecified: Secondary | ICD-10-CM | POA: Diagnosis not present

## 2020-10-14 DIAGNOSIS — E1122 Type 2 diabetes mellitus with diabetic chronic kidney disease: Secondary | ICD-10-CM | POA: Diagnosis not present

## 2020-10-14 DIAGNOSIS — I639 Cerebral infarction, unspecified: Secondary | ICD-10-CM | POA: Diagnosis not present

## 2020-10-14 DIAGNOSIS — E782 Mixed hyperlipidemia: Secondary | ICD-10-CM | POA: Diagnosis not present

## 2020-10-14 DIAGNOSIS — G459 Transient cerebral ischemic attack, unspecified: Secondary | ICD-10-CM | POA: Diagnosis not present

## 2020-10-22 ENCOUNTER — Ambulatory Visit: Payer: Medicare Other | Admitting: Cardiovascular Disease

## 2020-11-05 DIAGNOSIS — E782 Mixed hyperlipidemia: Secondary | ICD-10-CM | POA: Diagnosis not present

## 2020-11-05 DIAGNOSIS — D649 Anemia, unspecified: Secondary | ICD-10-CM | POA: Diagnosis not present

## 2020-11-05 DIAGNOSIS — I509 Heart failure, unspecified: Secondary | ICD-10-CM | POA: Diagnosis not present

## 2020-11-05 DIAGNOSIS — Z8673 Personal history of transient ischemic attack (TIA), and cerebral infarction without residual deficits: Secondary | ICD-10-CM | POA: Diagnosis not present

## 2020-11-05 DIAGNOSIS — Z853 Personal history of malignant neoplasm of breast: Secondary | ICD-10-CM | POA: Diagnosis not present

## 2020-11-05 DIAGNOSIS — F5101 Primary insomnia: Secondary | ICD-10-CM | POA: Diagnosis not present

## 2020-11-05 DIAGNOSIS — I10 Essential (primary) hypertension: Secondary | ICD-10-CM | POA: Diagnosis not present

## 2020-11-05 DIAGNOSIS — I5042 Chronic combined systolic (congestive) and diastolic (congestive) heart failure: Secondary | ICD-10-CM | POA: Diagnosis not present

## 2020-11-05 DIAGNOSIS — E1122 Type 2 diabetes mellitus with diabetic chronic kidney disease: Secondary | ICD-10-CM | POA: Diagnosis not present

## 2020-11-05 DIAGNOSIS — N183 Chronic kidney disease, stage 3 unspecified: Secondary | ICD-10-CM | POA: Diagnosis not present

## 2020-11-14 DIAGNOSIS — E782 Mixed hyperlipidemia: Secondary | ICD-10-CM | POA: Diagnosis not present

## 2020-11-14 DIAGNOSIS — E1122 Type 2 diabetes mellitus with diabetic chronic kidney disease: Secondary | ICD-10-CM | POA: Diagnosis not present

## 2020-11-14 DIAGNOSIS — D649 Anemia, unspecified: Secondary | ICD-10-CM | POA: Diagnosis not present

## 2020-12-29 DIAGNOSIS — J40 Bronchitis, not specified as acute or chronic: Secondary | ICD-10-CM | POA: Diagnosis not present

## 2020-12-29 DIAGNOSIS — J329 Chronic sinusitis, unspecified: Secondary | ICD-10-CM | POA: Diagnosis not present

## 2020-12-29 DIAGNOSIS — R059 Cough, unspecified: Secondary | ICD-10-CM | POA: Diagnosis not present

## 2020-12-29 DIAGNOSIS — E1122 Type 2 diabetes mellitus with diabetic chronic kidney disease: Secondary | ICD-10-CM | POA: Diagnosis not present

## 2020-12-29 DIAGNOSIS — U071 COVID-19: Secondary | ICD-10-CM | POA: Diagnosis not present

## 2020-12-30 ENCOUNTER — Emergency Department (HOSPITAL_BASED_OUTPATIENT_CLINIC_OR_DEPARTMENT_OTHER): Payer: Medicare Other

## 2020-12-30 ENCOUNTER — Other Ambulatory Visit: Payer: Self-pay

## 2020-12-30 ENCOUNTER — Emergency Department (HOSPITAL_BASED_OUTPATIENT_CLINIC_OR_DEPARTMENT_OTHER)
Admission: EM | Admit: 2020-12-30 | Discharge: 2020-12-31 | Disposition: A | Discharge status: Home / Self Care | Payer: Medicare Other | Attending: Emergency Medicine | Admitting: Emergency Medicine

## 2020-12-30 DIAGNOSIS — E860 Dehydration: Secondary | ICD-10-CM | POA: Diagnosis not present

## 2020-12-30 DIAGNOSIS — N179 Acute kidney failure, unspecified: Secondary | ICD-10-CM | POA: Insufficient documentation

## 2020-12-30 DIAGNOSIS — Z7901 Long term (current) use of anticoagulants: Secondary | ICD-10-CM | POA: Diagnosis not present

## 2020-12-30 DIAGNOSIS — Z79899 Other long term (current) drug therapy: Secondary | ICD-10-CM | POA: Diagnosis not present

## 2020-12-30 DIAGNOSIS — I5042 Chronic combined systolic (congestive) and diastolic (congestive) heart failure: Secondary | ICD-10-CM | POA: Insufficient documentation

## 2020-12-30 DIAGNOSIS — U071 COVID-19: Secondary | ICD-10-CM | POA: Diagnosis not present

## 2020-12-30 DIAGNOSIS — Z853 Personal history of malignant neoplasm of breast: Secondary | ICD-10-CM | POA: Diagnosis not present

## 2020-12-30 DIAGNOSIS — R0602 Shortness of breath: Secondary | ICD-10-CM | POA: Diagnosis not present

## 2020-12-30 DIAGNOSIS — Z87891 Personal history of nicotine dependence: Secondary | ICD-10-CM | POA: Diagnosis not present

## 2020-12-30 DIAGNOSIS — E119 Type 2 diabetes mellitus without complications: Secondary | ICD-10-CM | POA: Insufficient documentation

## 2020-12-30 DIAGNOSIS — I110 Hypertensive heart disease with heart failure: Secondary | ICD-10-CM | POA: Insufficient documentation

## 2020-12-30 DIAGNOSIS — R059 Cough, unspecified: Secondary | ICD-10-CM | POA: Diagnosis present

## 2020-12-30 LAB — COMPREHENSIVE METABOLIC PANEL
ALT: 18 U/L (ref 0–44)
AST: 26 U/L (ref 15–41)
Albumin: 3.5 g/dL (ref 3.5–5.0)
Alkaline Phosphatase: 52 U/L (ref 38–126)
Anion gap: 10 (ref 5–15)
BUN: 35 mg/dL — ABNORMAL HIGH (ref 8–23)
CO2: 27 mmol/L (ref 22–32)
Calcium: 8.9 mg/dL (ref 8.9–10.3)
Chloride: 97 mmol/L — ABNORMAL LOW (ref 98–111)
Creatinine, Ser: 1.59 mg/dL — ABNORMAL HIGH (ref 0.44–1.00)
GFR, Estimated: 31 mL/min — ABNORMAL LOW (ref >60)
Glucose, Bld: 134 mg/dL — ABNORMAL HIGH (ref 70–99)
Potassium: 4 mmol/L (ref 3.5–5.1)
Sodium: 134 mmol/L — ABNORMAL LOW (ref 135–145)
Total Bilirubin: 0.5 mg/dL (ref 0.3–1.2)
Total Protein: 7.6 g/dL (ref 6.5–8.1)

## 2020-12-30 LAB — CBC WITH DIFFERENTIAL/PLATELET
Abs Immature Granulocytes: 0.17 10*3/uL — ABNORMAL HIGH (ref 0.00–0.07)
Basophils Absolute: 0 10*3/uL (ref 0.0–0.1)
Basophils Relative: 0 %
Eosinophils Absolute: 0 10*3/uL (ref 0.0–0.5)
Eosinophils Relative: 0 %
HCT: 31.7 % — ABNORMAL LOW (ref 36.0–46.0)
Hemoglobin: 10.4 g/dL — ABNORMAL LOW (ref 12.0–15.0)
Immature Granulocytes: 1 %
Lymphocytes Relative: 5 %
Lymphs Abs: 0.9 10*3/uL (ref 0.7–4.0)
MCH: 29.8 pg (ref 26.0–34.0)
MCHC: 32.8 g/dL (ref 30.0–36.0)
MCV: 90.8 fL (ref 80.0–100.0)
Monocytes Absolute: 1.7 10*3/uL — ABNORMAL HIGH (ref 0.1–1.0)
Monocytes Relative: 10 %
Neutro Abs: 14.6 10*3/uL — ABNORMAL HIGH (ref 1.7–7.7)
Neutrophils Relative %: 84 %
Platelets: 161 10*3/uL (ref 150–400)
RBC: 3.49 MIL/uL — ABNORMAL LOW (ref 3.87–5.11)
RDW: 16.6 % — ABNORMAL HIGH (ref 11.5–15.5)
WBC: 17.4 10*3/uL — ABNORMAL HIGH (ref 4.0–10.5)
nRBC: 0 % (ref 0.0–0.2)

## 2020-12-30 MED ORDER — SODIUM CHLORIDE 0.9 % IV BOLUS
500.0000 mL | Freq: Once | INTRAVENOUS | Status: AC
  Administered 2020-12-30: 500 mL via INTRAVENOUS

## 2020-12-30 NOTE — ED Provider Notes (Signed)
[place] EMERGENCY DEPT Provider Note   CSN: [id number] Arrival date & time: 12/30/20  2008     History Chief Complaint  Patient presents with  . Cough    Helen [name] is a 87 y.o. female.  HPI Patient presents with known COVID infection.  Has had cough for around 7 days.  Positive COVID test to the office yesterday.  Sent in by PCP because she was feeling a little weak all over.  Has a history of hyponatremia thought she may be low.  Patient states she is able to walk pretty well but gets mildly short of breath.  She has been vaccinated and booster x1.  No fevers.  States her urine is darker than normal.  No chest pain.  Has had a cough but no real sputum production with it.    Past Medical History:  Diagnosis Date  . Anemia   . Cancer [facility])    Breast cancer  . Chronic combined systolic (congestive) and diastolic (congestive) heart failure (HCC)    a. EF 25-30% by echo in 11/2016. Recent NST showing no inducible ischemia --> therefore thought to be most consistent with nonischemic cardiomyopathy.   . Diabetes mellitus   . History of stroke 11/2016   incidental finding on MRI  . Hypercholesteremia   . Hypertension   . Peripheral vascular disease ([place]) 2015   bilateral moderate CA disease-Dr Bridgett Larsson    Patient Active Problem List   Diagnosis Date Noted  . Nonischemic cardiomyopathy ([place]) 12/08/2016  . SOB (shortness of breath) 11/12/2016  . Shortness of breath 11/11/2016  . Chronic respiratory failure with hypoxia ([place]) 11/11/2016  . Anemia of chronic disease 03/24/2016  . Chronic combined systolic and diastolic heart failure ([place]) 03/24/2016  . Acute respiratory failure with hypoxia ([place]) 03/24/2016  . Scarring of lung following radiation- RLL 03/24/2016  . Essential hypertension   . Acute encephalopathy 12/13/2015  . Hyponatremia 12/13/2015  . Hypokalemia 12/13/2015  . UTI (lower urinary tract infection) 12/13/2015  . Leukocytosis 12/13/2015  .  Hypercholesteremia   . Diabetes mellitus without complication ([place])   . History of stroke   . Peripheral vascular disease ([place])   . Carotid stenosis 03/13/2015    Past Surgical History:  Procedure Laterality Date  . ABDOMINAL HYSTERECTOMY    . APPENDECTOMY  1951  . BIOPSY  12/13/2019   Procedure: BIOPSY;  Surgeon: Wilford Corner, MD;  Location: [facility];  Service: Endoscopy;;  . [place]  . CHOLECYSTECTOMY  2003   By Dr. Excell Seltzer  . COLONOSCOPY WITH PROPOFOL N/A 12/13/2019   Procedure: COLONOSCOPY WITH PROPOFOL;  Surgeon: Wilford Corner, MD;  [facility];  Service: Endoscopy;  Laterality: N/A;  . ESOPHAGOGASTRODUODENOSCOPY N/A 12/13/2019   Procedure: ESOPHAGOGASTRODUODENOSCOPY (EGD);  Surgeon: Wilford Corner, MD;  Location: [facility];  Service: Endoscopy;  Laterality: N/A;  . EYE SURGERY Bilateral 2009   Cataract surgery  . FRACTURE SURGERY Right    ORIF   by Dr. Burney Gauze  . HUMERUS FRACTURE SURGERY    . MASTECTOMY Bilateral   . NASAL SEPTUM SURGERY  1977   Dr. Ernesto Rutherford  . POLYPECTOMY  12/13/2019   Procedure: POLYPECTOMY;  Surgeon: Wilford Corner, MD;  [facility];  Service: Endoscopy;;  . TOENAIL EXCISION       OB History   No obstetric history on file.     Family History  Problem Relation Age of Onset  . Cancer Mother   . Pulmonary embolism Mother   .  Heart disease Father   . Heart attack Father   . Stroke Father   . Diabetes Brother   . Hyperlipidemia Brother   . Stroke Brother   . Kidney disease Brother     Social History   Tobacco Use  . Smoking status: Former Smoker    Quit date: 08/24/1978    Years since quitting: 42.3  . Smokeless tobacco: Never Used  Substance Use Topics  . Alcohol use: No  . Drug use: No    Home Medications Prior to Admission medications   Medication Sig Start Date End Date Taking? Authorizing Provider  atorvastatin (LIPITOR) 80 MG tablet Take 80 mg by mouth at bedtime.     [provider]  BIOTIN PO Take 1 tablet by mouth daily.    [provider]  bismuth subsalicylate (PEPTO BISMOL) 262 MG chewable tablet Chew 524 mg by mouth as needed for indigestion or diarrhea or loose stools.    [provider]  carvedilol (COREG) 25 MG tablet TAKE 1 TABLET BY MOUTH  TWICE DAILY WITH MEALS 08/06/20   Skeet Latch, MD  cholecalciferol (VITAMIN D) 1000 units tablet Take 1,000 Units by mouth every evening.     [provider]  citalopram (CELEXA) 20 MG tablet Take 20 mg by mouth daily. 12/01/19   [provider]  ELIQUIS 5 MG TABS tablet TAKE 1 TABLET BY MOUTH  TWICE DAILY 06/19/20   Skeet Latch, MD  ENTRESTO 49-51 MG TAKE 1 TABLET BY MOUTH  TWICE DAILY 03/27/20   Skeet Latch, MD  furosemide (LASIX) 20 MG tablet TAKE 1 TABLET BY MOUTH  DAILY 03/27/20   Skeet Latch, MD  magnesium oxide (MAG-OX) 400 MG tablet 1 tablet by mouth twice a day for 1 week and then 1 daily Patient taking differently: Take 400 mg by mouth daily.  03/24/18   Skeet Latch, MD  Multiple Vitamin (MULTIVITAMIN WITH MINERALS) TABS tablet Take 1 tablet by mouth daily.     [provider]  pantoprazole (PROTONIX) 40 MG tablet Take 1 tablet (40 mg total) by mouth 2 (two) times daily before a meal. 12/14/19   Amin, Jeanella Flattery, MD  senna-docusate (SENOKOT-S) 8.6-50 MG tablet Take 2 tablets by mouth at bedtime as needed for mild constipation or moderate constipation. 12/14/19   Amin, Jeanella Flattery, MD  vitamin C (ASCORBIC ACID) 500 MG tablet Take 500 mg by mouth daily.     [provider]    Allergies    Morphine and related, Other, Sulfa antibiotics, Sulfasalazine, and Nickel  Review of Systems   Review of Systems  Constitutional: Positive for fatigue.  HENT: Negative for congestion.   Respiratory: Positive for cough.   Cardiovascular: Negative for chest pain.  Gastrointestinal: Negative for abdominal pain.  Genitourinary:  Negative for dysuria and flank pain.  Musculoskeletal: Negative for back pain.  Skin: Negative for rash.  Neurological: Negative for weakness.  Psychiatric/Behavioral: Negative for confusion.    Physical Exam Updated Vital Signs BP (!) 127/45   Pulse 79   Temp 99.1 F (37.3 C) (Oral)   Resp (!) 25   Ht 5\' 2"  (1.575 m)   Wt 67.1 kg   SpO2 100%   BMI 27.07 kg/m   Physical Exam Vitals and nursing note reviewed.  HENT:     Head: Normocephalic.     Mouth/Throat:     Mouth: Mucous membranes are moist.  Cardiovascular:     Rate and Rhythm: Normal rate.  Pulmonary:  Effort: Pulmonary effort is normal. No respiratory distress.  Abdominal:     Tenderness: There is no abdominal tenderness.  Musculoskeletal:        General: No tenderness.  Skin:    General: Skin is warm.     Capillary Refill: Capillary refill takes less than 2 seconds.  Neurological:     Mental Status: She is alert and oriented to person, place, and time.     ED Results / Procedures / Treatments   Labs (all labs ordered are listed, but only abnormal results are displayed) Labs Reviewed  COMPREHENSIVE METABOLIC PANEL - Abnormal; Notable for the following components:      Result Value   Sodium 134 (*)    Chloride 97 (*)    Glucose, Bld 134 (*)    BUN 35 (*)    Creatinine, Ser 1.59 (*)    GFR, Estimated 31 (*)    All other components within normal limits  CBC WITH DIFFERENTIAL/PLATELET - Abnormal; Notable for the following components:   WBC 17.4 (*)    RBC 3.49 (*)    Hemoglobin 10.4 (*)    HCT 31.7 (*)    RDW 16.6 (*)    Neutro Abs 14.6 (*)    Monocytes Absolute 1.7 (*)    Abs Immature Granulocytes 0.17 (*)    All other components within normal limits    EKG None  Radiology DG Chest Port 1 View  Result Date: 12/30/2020 CLINICAL DATA:  Shortness of breath, COVID-19 positivity EXAM: PORTABLE CHEST 1 VIEW COMPARISON:  11/11/2016 FINDINGS: Cardiac shadow is prominent but stable. Aortic  calcifications are noted. Chronic scarring is noted in the lungs bilaterally similar to that seen on the prior study. No new focal infiltrate is seen. No bony abnormality is noted. IMPRESSION: Chronic changes in the right lung without acute abnormality. Electronically Signed   By: Inez Catalina M.D.   On: 12/30/2020 22:43    Procedures Procedures   Medications Ordered in ED Medications  sodium chloride 0.9 % bolus 500 mL (0 mLs Intravenous Stopped 12/31/20 0009)    ED Course  I have reviewed the triage vital signs and the nursing notes.  Pertinent labs & imaging results that were available during my care of the patient were reviewed by me and considered in my medical decision making (see chart for details).    MDM Rules/Calculators/A&P                          Patient with known COVID infection.  Symptoms for over 5 days however.  Sent in for evaluation of sodium.  His sodium is minimally low but does have an acute mild kidney injury.  Creatinine mildly increased.  Fluid bolus given.  Lungs show no acute changes.  Not hypoxic.  Does not appear to need admission to the hospital.  Will follow with PCP about adjustment of medications since she is on diuretics.  However appears stable for discharge home.  Not a candidate for other acute treatments for COVID with the time of the symptoms. Final Clinical Impression(s) / ED Diagnoses Final diagnoses:  COVID-19  AKI (acute kidney injury) ([place])  Dehydration    Rx / DC Orders ED Discharge Orders    None       Davonna Belling, MD 12/31/20 0148

## 2020-12-30 NOTE — ED Triage Notes (Addendum)
Pt came in to  Check her sodium level  - [facility] -per daughter, she was told  That her mom was having sx like when she had low  Sodium   Pt also c/o cough and SOB when move around -  Tested  covid positive yesterday   At PCP -  Pt is alert and oriented at this time

## 2020-12-31 NOTE — Discharge Instructions (Addendum)
Your creatinine was mildly increased today.  It was up to around 1.6.  You have been given some fluids.  Follow-up with your doctor about other potential adjustments of your medications.  Watch for worsening shortness of breath with COVID

## 2021-01-03 ENCOUNTER — Inpatient Hospital Stay (HOSPITAL_COMMUNITY)
Admission: EM | Admit: 2021-01-03 | Discharge: 2021-01-10 | DRG: 177 | Disposition: A | Discharge status: Home Health | Payer: Medicare Other | Attending: Internal Medicine | Admitting: Internal Medicine

## 2021-01-03 ENCOUNTER — Other Ambulatory Visit: Payer: Self-pay

## 2021-01-03 ENCOUNTER — Emergency Department (HOSPITAL_COMMUNITY): Payer: Medicare Other

## 2021-01-03 ENCOUNTER — Encounter (HOSPITAL_COMMUNITY): Payer: Self-pay

## 2021-01-03 DIAGNOSIS — J189 Pneumonia, unspecified organism: Secondary | ICD-10-CM | POA: Diagnosis not present

## 2021-01-03 DIAGNOSIS — E7800 Pure hypercholesterolemia, unspecified: Secondary | ICD-10-CM | POA: Diagnosis present

## 2021-01-03 DIAGNOSIS — I4581 Long QT syndrome: Secondary | ICD-10-CM | POA: Diagnosis not present

## 2021-01-03 DIAGNOSIS — I10 Essential (primary) hypertension: Secondary | ICD-10-CM | POA: Diagnosis not present

## 2021-01-03 DIAGNOSIS — A0839 Other viral enteritis: Secondary | ICD-10-CM | POA: Diagnosis present

## 2021-01-03 DIAGNOSIS — J9621 Acute and chronic respiratory failure with hypoxia: Secondary | ICD-10-CM | POA: Diagnosis not present

## 2021-01-03 DIAGNOSIS — T380X5A Adverse effect of glucocorticoids and synthetic analogues, initial encounter: Secondary | ICD-10-CM | POA: Diagnosis not present

## 2021-01-03 DIAGNOSIS — Z823 Family history of stroke: Secondary | ICD-10-CM

## 2021-01-03 DIAGNOSIS — Z7901 Long term (current) use of anticoagulants: Secondary | ICD-10-CM

## 2021-01-03 DIAGNOSIS — I5042 Chronic combined systolic (congestive) and diastolic (congestive) heart failure: Secondary | ICD-10-CM | POA: Diagnosis not present

## 2021-01-03 DIAGNOSIS — E119 Type 2 diabetes mellitus without complications: Secondary | ICD-10-CM

## 2021-01-03 DIAGNOSIS — E871 Hypo-osmolality and hyponatremia: Secondary | ICD-10-CM | POA: Diagnosis present

## 2021-01-03 DIAGNOSIS — Z9841 Cataract extraction status, right eye: Secondary | ICD-10-CM

## 2021-01-03 DIAGNOSIS — I480 Paroxysmal atrial fibrillation: Secondary | ICD-10-CM | POA: Diagnosis present

## 2021-01-03 DIAGNOSIS — Z9221 Personal history of antineoplastic chemotherapy: Secondary | ICD-10-CM

## 2021-01-03 DIAGNOSIS — E1165 Type 2 diabetes mellitus with hyperglycemia: Secondary | ICD-10-CM | POA: Diagnosis not present

## 2021-01-03 DIAGNOSIS — Z87891 Personal history of nicotine dependence: Secondary | ICD-10-CM

## 2021-01-03 DIAGNOSIS — G9341 Metabolic encephalopathy: Secondary | ICD-10-CM | POA: Diagnosis present

## 2021-01-03 DIAGNOSIS — U071 COVID-19: Secondary | ICD-10-CM | POA: Diagnosis not present

## 2021-01-03 DIAGNOSIS — Y92239 Unspecified place in hospital as the place of occurrence of the external cause: Secondary | ICD-10-CM | POA: Diagnosis not present

## 2021-01-03 DIAGNOSIS — R410 Disorientation, unspecified: Secondary | ICD-10-CM | POA: Diagnosis not present

## 2021-01-03 DIAGNOSIS — F419 Anxiety disorder, unspecified: Secondary | ICD-10-CM | POA: Diagnosis present

## 2021-01-03 DIAGNOSIS — Z833 Family history of diabetes mellitus: Secondary | ICD-10-CM

## 2021-01-03 DIAGNOSIS — Z79899 Other long term (current) drug therapy: Secondary | ICD-10-CM

## 2021-01-03 DIAGNOSIS — K219 Gastro-esophageal reflux disease without esophagitis: Secondary | ICD-10-CM | POA: Diagnosis present

## 2021-01-03 DIAGNOSIS — Z853 Personal history of malignant neoplasm of breast: Secondary | ICD-10-CM

## 2021-01-03 DIAGNOSIS — Z66 Do not resuscitate: Secondary | ICD-10-CM | POA: Diagnosis present

## 2021-01-03 DIAGNOSIS — Z885 Allergy status to narcotic agent status: Secondary | ICD-10-CM

## 2021-01-03 DIAGNOSIS — R9431 Abnormal electrocardiogram [ECG] [EKG]: Secondary | ICD-10-CM

## 2021-01-03 DIAGNOSIS — R0602 Shortness of breath: Secondary | ICD-10-CM | POA: Diagnosis not present

## 2021-01-03 DIAGNOSIS — D638 Anemia in other chronic diseases classified elsewhere: Secondary | ICD-10-CM | POA: Diagnosis not present

## 2021-01-03 DIAGNOSIS — J1282 Pneumonia due to coronavirus disease 2019: Secondary | ICD-10-CM | POA: Diagnosis present

## 2021-01-03 DIAGNOSIS — Z9013 Acquired absence of bilateral breasts and nipples: Secondary | ICD-10-CM

## 2021-01-03 DIAGNOSIS — G9340 Encephalopathy, unspecified: Secondary | ICD-10-CM | POA: Diagnosis not present

## 2021-01-03 DIAGNOSIS — I6523 Occlusion and stenosis of bilateral carotid arteries: Secondary | ICD-10-CM

## 2021-01-03 DIAGNOSIS — Z90710 Acquired absence of both cervix and uterus: Secondary | ICD-10-CM

## 2021-01-03 DIAGNOSIS — Z841 Family history of disorders of kidney and ureter: Secondary | ICD-10-CM

## 2021-01-03 DIAGNOSIS — Z888 Allergy status to other drugs, medicaments and biological substances status: Secondary | ICD-10-CM

## 2021-01-03 DIAGNOSIS — Z8249 Family history of ischemic heart disease and other diseases of the circulatory system: Secondary | ICD-10-CM

## 2021-01-03 DIAGNOSIS — I110 Hypertensive heart disease with heart failure: Secondary | ICD-10-CM | POA: Diagnosis present

## 2021-01-03 DIAGNOSIS — Z9049 Acquired absence of other specified parts of digestive tract: Secondary | ICD-10-CM

## 2021-01-03 DIAGNOSIS — E1151 Type 2 diabetes mellitus with diabetic peripheral angiopathy without gangrene: Secondary | ICD-10-CM | POA: Diagnosis present

## 2021-01-03 DIAGNOSIS — I6529 Occlusion and stenosis of unspecified carotid artery: Secondary | ICD-10-CM | POA: Diagnosis present

## 2021-01-03 DIAGNOSIS — I739 Peripheral vascular disease, unspecified: Secondary | ICD-10-CM | POA: Diagnosis not present

## 2021-01-03 DIAGNOSIS — Z882 Allergy status to sulfonamides status: Secondary | ICD-10-CM

## 2021-01-03 DIAGNOSIS — Z83438 Family history of other disorder of lipoprotein metabolism and other lipidemia: Secondary | ICD-10-CM

## 2021-01-03 DIAGNOSIS — R443 Hallucinations, unspecified: Secondary | ICD-10-CM | POA: Diagnosis present

## 2021-01-03 DIAGNOSIS — Z8673 Personal history of transient ischemic attack (TIA), and cerebral infarction without residual deficits: Secondary | ICD-10-CM

## 2021-01-03 DIAGNOSIS — E785 Hyperlipidemia, unspecified: Secondary | ICD-10-CM | POA: Diagnosis present

## 2021-01-03 DIAGNOSIS — F32A Depression, unspecified: Secondary | ICD-10-CM | POA: Diagnosis present

## 2021-01-03 DIAGNOSIS — R7401 Elevation of levels of liver transaminase levels: Secondary | ICD-10-CM | POA: Diagnosis present

## 2021-01-03 DIAGNOSIS — Z9842 Cataract extraction status, left eye: Secondary | ICD-10-CM

## 2021-01-03 HISTORY — DX: Depression, unspecified: F32.A

## 2021-01-03 HISTORY — DX: Anxiety disorder, unspecified: F41.9

## 2021-01-03 LAB — COMPREHENSIVE METABOLIC PANEL
ALT: 70 U/L — ABNORMAL HIGH (ref 0–44)
AST: 60 U/L — ABNORMAL HIGH (ref 15–41)
Albumin: 3 g/dL — ABNORMAL LOW (ref 3.5–5.0)
Alkaline Phosphatase: 64 U/L (ref 38–126)
Anion gap: 10 (ref 5–15)
BUN: 25 mg/dL — ABNORMAL HIGH (ref 8–23)
CO2: 27 mmol/L (ref 22–32)
Calcium: 8.9 mg/dL (ref 8.9–10.3)
Chloride: 95 mmol/L — ABNORMAL LOW (ref 98–111)
Creatinine, Ser: 0.95 mg/dL (ref 0.44–1.00)
GFR, Estimated: 57 mL/min — ABNORMAL LOW (ref >60)
Glucose, Bld: 129 mg/dL — ABNORMAL HIGH (ref 70–99)
Potassium: 3.5 mmol/L (ref 3.5–5.1)
Sodium: 132 mmol/L — ABNORMAL LOW (ref 135–145)
Total Bilirubin: 0.7 mg/dL (ref 0.3–1.2)
Total Protein: 7.8 g/dL (ref 6.5–8.1)

## 2021-01-03 LAB — CBC WITH DIFFERENTIAL/PLATELET
Abs Immature Granulocytes: 0.38 10*3/uL — ABNORMAL HIGH (ref 0.00–0.07)
Basophils Absolute: 0 10*3/uL (ref 0.0–0.1)
Basophils Relative: 0 %
Eosinophils Absolute: 0.1 10*3/uL (ref 0.0–0.5)
Eosinophils Relative: 0 %
HCT: 32.3 % — ABNORMAL LOW (ref 36.0–46.0)
Hemoglobin: 10.6 g/dL — ABNORMAL LOW (ref 12.0–15.0)
Immature Granulocytes: 2 %
Lymphocytes Relative: 7 %
Lymphs Abs: 1.1 10*3/uL (ref 0.7–4.0)
MCH: 29.9 pg (ref 26.0–34.0)
MCHC: 32.8 g/dL (ref 30.0–36.0)
MCV: 91.2 fL (ref 80.0–100.0)
Monocytes Absolute: 1.1 10*3/uL — ABNORMAL HIGH (ref 0.1–1.0)
Monocytes Relative: 7 %
Neutro Abs: 13.5 10*3/uL — ABNORMAL HIGH (ref 1.7–7.7)
Neutrophils Relative %: 84 %
Platelets: 201 10*3/uL (ref 150–400)
RBC: 3.54 MIL/uL — ABNORMAL LOW (ref 3.87–5.11)
RDW: 16.4 % — ABNORMAL HIGH (ref 11.5–15.5)
WBC: 16.1 10*3/uL — ABNORMAL HIGH (ref 4.0–10.5)
nRBC: 0 % (ref 0.0–0.2)

## 2021-01-03 LAB — BLOOD GAS, VENOUS
Acid-Base Excess: 0.2 mmol/L (ref 0.0–2.0)
Bicarbonate: 25.3 mmol/L (ref 20.0–28.0)
O2 Saturation: 71.5 %
Patient temperature: 98.6
pCO2, Ven: 46 mmHg (ref 44.0–60.0)
pH, Ven: 7.36 (ref 7.250–7.430)
pO2, Ven: 43.8 mmHg (ref 32.0–45.0)

## 2021-01-03 LAB — MAGNESIUM: Magnesium: 1.5 mg/dL — ABNORMAL LOW (ref 1.7–2.4)

## 2021-01-03 LAB — CBG MONITORING, ED: Glucose-Capillary: 120 mg/dL — ABNORMAL HIGH (ref 70–99)

## 2021-01-03 LAB — LACTIC ACID, PLASMA
Lactic Acid, Venous: 0.9 mmol/L (ref 0.5–1.9)
Lactic Acid, Venous: 1.1 mmol/L (ref 0.5–1.9)

## 2021-01-03 MED ORDER — LACTATED RINGERS IV SOLN: INTRAVENOUS | Status: DC

## 2021-01-03 MED ORDER — APIXABAN 5 MG PO TABS
5.0000 mg | ORAL_TABLET | Freq: Two times a day (BID) | ORAL | Status: DC
  Administered 2021-01-04 – 2021-01-10 (×14): 5 mg via ORAL
  Filled 2021-01-03: qty 1
  Filled 2021-01-03: qty 2
  Filled 2021-01-03 (×12): qty 1

## 2021-01-03 MED ORDER — LACTATED RINGERS IV BOLUS (SEPSIS)
1000.0000 mL | Freq: Once | INTRAVENOUS | Status: AC
  Administered 2021-01-03: 1000 mL via INTRAVENOUS

## 2021-01-03 MED ORDER — SODIUM CHLORIDE 0.9 % IV SOLN
2.0000 g | INTRAVENOUS | Status: AC
  Administered 2021-01-03 – 2021-01-09 (×7): 2 g via INTRAVENOUS
  Filled 2021-01-03: qty 20
  Filled 2021-01-03: qty 2
  Filled 2021-01-03: qty 20
  Filled 2021-01-03 (×2): qty 2
  Filled 2021-01-03 (×2): qty 20

## 2021-01-03 MED ORDER — ACETAMINOPHEN 650 MG RE SUPP: 650.0000 mg | Freq: Four times a day (QID) | RECTAL | Status: DC | PRN

## 2021-01-03 MED ORDER — SACUBITRIL-VALSARTAN 49-51 MG PO TABS
1.0000 | ORAL_TABLET | Freq: Two times a day (BID) | ORAL | Status: DC
  Administered 2021-01-04 – 2021-01-10 (×14): 1 via ORAL
  Filled 2021-01-03 (×14): qty 1

## 2021-01-03 MED ORDER — ACETAMINOPHEN 325 MG PO TABS: 650.0000 mg | ORAL_TABLET | Freq: Four times a day (QID) | ORAL | Status: DC | PRN

## 2021-01-03 MED ORDER — SODIUM CHLORIDE 0.9 % IV SOLN
500.0000 mg | INTRAVENOUS | Status: DC
  Administered 2021-01-03 – 2021-01-05 (×3): 500 mg via INTRAVENOUS
  Filled 2021-01-03 (×3): qty 500

## 2021-01-03 MED ORDER — ATORVASTATIN CALCIUM 40 MG PO TABS
80.0000 mg | ORAL_TABLET | Freq: Every day | ORAL | Status: DC
  Administered 2021-01-04 – 2021-01-09 (×7): 80 mg via ORAL
  Filled 2021-01-03 (×7): qty 2

## 2021-01-03 MED ORDER — SENNOSIDES-DOCUSATE SODIUM 8.6-50 MG PO TABS: 2.0000 | ORAL_TABLET | Freq: Every evening | ORAL | Status: DC | PRN

## 2021-01-03 MED ORDER — PANTOPRAZOLE SODIUM 40 MG PO TBEC
40.0000 mg | DELAYED_RELEASE_TABLET | Freq: Two times a day (BID) | ORAL | Status: DC
  Administered 2021-01-04 – 2021-01-10 (×13): 40 mg via ORAL
  Filled 2021-01-03 (×13): qty 1

## 2021-01-03 MED ORDER — CITALOPRAM HYDROBROMIDE 20 MG PO TABS
20.0000 mg | ORAL_TABLET | Freq: Every day | ORAL | Status: DC
  Administered 2021-01-04 – 2021-01-05 (×2): 20 mg via ORAL
  Filled 2021-01-03: qty 1
  Filled 2021-01-03: qty 2
  Filled 2021-01-03: qty 1

## 2021-01-03 MED ORDER — SODIUM CHLORIDE 0.9% FLUSH
3.0000 mL | Freq: Two times a day (BID) | INTRAVENOUS | Status: DC
  Administered 2021-01-04 – 2021-01-10 (×12): 3 mL via INTRAVENOUS

## 2021-01-03 MED ORDER — CARVEDILOL 25 MG PO TABS
25.0000 mg | ORAL_TABLET | Freq: Two times a day (BID) | ORAL | Status: DC
  Administered 2021-01-04 – 2021-01-10 (×13): 25 mg via ORAL
  Filled 2021-01-03 (×2): qty 1
  Filled 2021-01-03: qty 2
  Filled 2021-01-03 (×9): qty 1
  Filled 2021-01-03: qty 2
  Filled 2021-01-03: qty 1

## 2021-01-03 MED ORDER — FUROSEMIDE 20 MG PO TABS
20.0000 mg | ORAL_TABLET | Freq: Every day | ORAL | Status: DC
  Administered 2021-01-04 – 2021-01-06 (×3): 20 mg via ORAL
  Filled 2021-01-03 (×3): qty 1

## 2021-01-03 NOTE — ED Notes (Signed)
Patient thought she needed to urinate, but was unable upon attempting. Purewick is in place.

## 2021-01-03 NOTE — ED Provider Notes (Signed)
Emergency Medicine Provider Triage Evaluation Note  Helen [name] , a 87 y.o. female  was evaluated in triage.  Pt complains of O2 92% earlier today, COVID + 12/23/20. Seen at [facility], mild AKI.  Confused since walk in clinic 12/29/20, progressively worsening. History of Na 115 in the past that caused confusion, also concerned for UTI. Brought in by daughter.  Review of Systems  Positive: Confusion  Negative: Fever, CP  Physical Exam  There were no vitals taken for this visit. Gen:   Awake, no distress   Resp:  Normal effort  MSK:   Moves extremities without difficulty  Other:  Alert to person, place, events   Medical Decision Making  Medically screening exam initiated at 4:58 PM.  Appropriate orders placed.  Helen [name] was informed that the remainder of the evaluation will be completed by another provider, this initial triage assessment does not replace that evaluation, and the importance of remaining in the ED until their evaluation is complete.     Tacy Learn, PA-C 01/03/21 1702    Daleen Bo, MD 01/03/21 2318

## 2021-01-03 NOTE — ED Triage Notes (Signed)
Patient and patient's daughter reports that the patient has been confused since 12/29/20. Patient's daughter reports that the patient's O sats were 92%. Triage sats-93%. Patient has been Covid + since 12/29/20.

## 2021-01-03 NOTE — ED Provider Notes (Addendum)
[facility] DEPT Provider Note   CSN: [id number] Arrival date & time: 01/03/21  1611     History Chief Complaint  Patient presents with  . Covid Positive    Helen [name] is a 87 y.o. female.  HPI She presents for evaluation of confusion characterized by seeing and hearing things that are not there.  She also is having mild shortness of breath, and cough that is productive of green to yellow sputum.  She is currently being treated with doxycycline for suspected pneumonia.  This was started, 5 days ago, prior to her diagnosis of COVID.  She has been sick for about 11 days with a respiratory infection.  At the time she was diagnosed, she was out of the window for treatment with antiviral medication.  She lives alone in an independent nursing care facility.  Staff members there contacted family members because she was confused, today.  Patient is in the ED with her daughter who states that the patient is usually bright and alert, and now is confused and different than her baseline.  The patient was evaluated on 12/30/2020, at that time treated with IV fluids and supportive care for COVID infection.  She does not have an oxygen requirement that time.  She does not have chronic pulmonary disease.  She does have history of congestive heart failure.  There are no other known active modifying factors.    Past Medical History:  Diagnosis Date  . Anemia   . Cancer [facility])    Breast cancer  . Chronic combined systolic (congestive) and diastolic (congestive) heart failure (HCC)    a. EF 25-30% by echo in 11/2016. Recent NST showing no inducible ischemia --> therefore thought to be most consistent with nonischemic cardiomyopathy.   . Diabetes mellitus   . History of stroke 11/2016   incidental finding on MRI  . Hypercholesteremia   . Hypertension   . Peripheral vascular disease ([place]) 2015   bilateral moderate CA disease-Dr Bridgett Larsson  . Stroke [facility])     Patient Active  Problem List   Diagnosis Date Noted  . CAP (community acquired pneumonia) 01/03/2021  . Nonischemic cardiomyopathy ([place]) 12/08/2016  . SOB (shortness of breath) 11/12/2016  . Shortness of breath 11/11/2016  . Chronic respiratory failure with hypoxia ([place]) 11/11/2016  . Anemia of chronic disease 03/24/2016  . Chronic combined systolic and diastolic heart failure ([place]) 03/24/2016  . Acute respiratory failure with hypoxia ([place]) 03/24/2016  . Scarring of lung following radiation- RLL 03/24/2016  . Essential hypertension   . Acute encephalopathy 12/13/2015  . Hyponatremia 12/13/2015  . Hypokalemia 12/13/2015  . UTI (lower urinary tract infection) 12/13/2015  . Leukocytosis 12/13/2015  . Hypercholesteremia   . Diabetes mellitus without complication ([place])   . History of stroke   . Peripheral vascular disease ([place])   . Carotid stenosis 03/13/2015    Past Surgical History:  Procedure Laterality Date  . ABDOMINAL HYSTERECTOMY    . APPENDECTOMY  1951  . BIOPSY  12/13/2019   Procedure: BIOPSY;  Surgeon: Wilford Corner, MD;  Location: [facility] ENDOSCOPY;  Service: Endoscopy;;  . [place]  . CHOLECYSTECTOMY  2003   By Dr. Excell Seltzer  . COLONOSCOPY WITH PROPOFOL N/A 12/13/2019   Procedure: COLONOSCOPY WITH PROPOFOL;  Surgeon: Wilford Corner, MD;  Location: [facility] ENDOSCOPY;  Service: Endoscopy;  Laterality: N/A;  . ESOPHAGOGASTRODUODENOSCOPY N/A 12/13/2019   Procedure: ESOPHAGOGASTRODUODENOSCOPY (EGD);  Surgeon: Wilford Corner, MD;  Location: [facility];  Service: Endoscopy;  Laterality: N/A;  . EYE SURGERY Bilateral 2009   Cataract surgery  . FRACTURE SURGERY Right    ORIF   by Dr. Burney Gauze  . HUMERUS FRACTURE SURGERY    . MASTECTOMY Bilateral   . NASAL SEPTUM SURGERY  1977   Dr. Ernesto Rutherford  . POLYPECTOMY  12/13/2019   Procedure: POLYPECTOMY;  Surgeon: Wilford Corner, MD;  Location: [facility] ENDOSCOPY;  Service: Endoscopy;;  . TOENAIL EXCISION       OB History   No  obstetric history on file.     Family History  Problem Relation Age of Onset  . Cancer Mother   . Pulmonary embolism Mother   . Heart disease Father   . Heart attack Father   . Stroke Father   . Diabetes Brother   . Hyperlipidemia Brother   . Stroke Brother   . Kidney disease Brother     Social History   Tobacco Use  . Smoking status: Former Smoker    Quit date: 08/24/1978    Years since quitting: 42.3  . Smokeless tobacco: Never Used  Vaping Use  . Vaping Use: Never used  Substance Use Topics  . Alcohol use: No  . Drug use: No    Home Medications Prior to Admission medications   Medication Sig Start Date End Date Taking? Authorizing Provider  atorvastatin (LIPITOR) 80 MG tablet Take 80 mg by mouth at bedtime.    [provider]  BIOTIN PO Take 1 tablet by mouth daily.    [provider]  bismuth subsalicylate (PEPTO BISMOL) 262 MG chewable tablet Chew 524 mg by mouth as needed for indigestion or diarrhea or loose stools.    [provider]  carvedilol (COREG) 25 MG tablet TAKE 1 TABLET BY MOUTH  TWICE DAILY WITH MEALS 08/06/20   Skeet Latch, MD  cholecalciferol (VITAMIN D) 1000 units tablet Take 1,000 Units by mouth every evening.     [provider]  citalopram (CELEXA) 20 MG tablet Take 20 mg by mouth daily. 12/01/19   [provider]  ELIQUIS 5 MG TABS tablet TAKE 1 TABLET BY MOUTH  TWICE DAILY 06/19/20   Skeet Latch, MD  ENTRESTO 49-51 MG TAKE 1 TABLET BY MOUTH  TWICE DAILY 03/27/20   Skeet Latch, MD  furosemide (LASIX) 20 MG tablet TAKE 1 TABLET BY MOUTH  DAILY 03/27/20   Skeet Latch, MD  magnesium oxide (MAG-OX) 400 MG tablet 1 tablet by mouth twice a day for 1 week and then 1 daily Patient taking differently: Take 400 mg by mouth daily.  03/24/18   Skeet Latch, MD  Multiple Vitamin (MULTIVITAMIN WITH MINERALS) TABS tablet Take 1 tablet by mouth daily.     [provider]  pantoprazole  (PROTONIX) 40 MG tablet Take 1 tablet (40 mg total) by mouth 2 (two) times daily before a meal. 12/14/19   Amin, Jeanella Flattery, MD  senna-docusate (SENOKOT-S) 8.6-50 MG tablet Take 2 tablets by mouth at bedtime as needed for mild constipation or moderate constipation. 12/14/19   Amin, Jeanella Flattery, MD  vitamin C (ASCORBIC ACID) 500 MG tablet Take 500 mg by mouth daily.     [provider]    Allergies    Morphine and related, Other, Sulfa antibiotics, Sulfasalazine, and Nickel  Review of Systems   Review of Systems  All other systems reviewed and are negative.   Physical Exam Updated Vital Signs BP (!) 105/92   Pulse 92   Temp 98.8 F (37.1 C) (Oral)  Resp (!) 21   Ht 5\' 2"  (1.575 m)   Wt 65.8 kg   SpO2 95%   BMI 26.52 kg/m   Physical Exam Vitals and nursing note reviewed.  Constitutional:      General: She is not in acute distress.    Appearance: She is well-developed. She is obese. She is not ill-appearing, toxic-appearing or diaphoretic.  HENT:     Head: Normocephalic and atraumatic.     Right Ear: External ear normal.     Left Ear: External ear normal.  Eyes:     Conjunctiva/sclera: Conjunctivae normal.     Pupils: Pupils are equal, round, and reactive to light.  Neck:     Trachea: Phonation normal.  Cardiovascular:     Rate and Rhythm: Normal rate and regular rhythm.     Pulses: Normal pulses.  Pulmonary:     Effort: Pulmonary effort is normal. No respiratory distress.     Breath sounds: No stridor.  Abdominal:     General: There is no distension.     Palpations: Abdomen is soft.     Tenderness: There is no abdominal tenderness.  Musculoskeletal:        General: Normal range of motion.     Cervical back: Normal range of motion and neck supple.  Skin:    General: Skin is warm and dry.  Neurological:     Mental Status: She is alert and oriented to person, place, and time.     Cranial Nerves: No cranial nerve deficit.     Sensory: No sensory deficit.      Motor: No abnormal muscle tone.     Coordination: Coordination normal.  Psychiatric:        Mood and Affect: Mood normal.        Behavior: Behavior normal.        Thought Content: Thought content normal.        Judgment: Judgment normal.     ED Results / Procedures / Treatments   Labs (all labs ordered are listed, but only abnormal results are displayed) Labs Reviewed  CBC WITH DIFFERENTIAL/PLATELET - Abnormal; Notable for the following components:      Result Value   WBC 16.1 (*)    RBC 3.54 (*)    Hemoglobin 10.6 (*)    HCT 32.3 (*)    RDW 16.4 (*)    Neutro Abs 13.5 (*)    Monocytes Absolute 1.1 (*)    Abs Immature Granulocytes 0.38 (*)    All other components within normal limits  COMPREHENSIVE METABOLIC PANEL - Abnormal; Notable for the following components:   Sodium 132 (*)    Chloride 95 (*)    Glucose, Bld 129 (*)    BUN 25 (*)    Albumin 3.0 (*)    AST 60 (*)    ALT 70 (*)    GFR, Estimated 57 (*)    All other components within normal limits  CBG MONITORING, ED - Abnormal; Notable for the following components:   Glucose-Capillary 120 (*)    All other components within normal limits  CULTURE, BLOOD (SINGLE)  CULTURE, BLOOD (SINGLE)  LACTIC ACID, PLASMA  BLOOD GAS, VENOUS  URINALYSIS, ROUTINE W REFLEX MICROSCOPIC  LACTIC ACID, PLASMA  BRAIN NATRIURETIC PEPTIDE  MAGNESIUM  COMPREHENSIVE METABOLIC PANEL  CBC    EKG EKG Interpretation  Date/Time:  Friday Jan 03 2021 19:22:58 EDT Ventricular Rate:  93 PR Interval:  197 QRS Duration: 92 QT Interval:  427 QTC Calculation:  523 R Axis:   69 Text Interpretation: Sinus rhythm Atrial premature complex Low voltage, precordial leads Prolonged QT interval Since last tracing QT has lengthened Otherwise no significant change Confirmed by Daleen Bo [phone]) on 01/03/2021 11:17:30 PM   Radiology DG Chest 1 View  Result Date: 01/03/2021 CLINICAL DATA:  Shortness of breath.  Confusion. EXAM: CHEST  1 VIEW  COMPARISON:  Dec 30, 2020 FINDINGS: There is airspace opacity in the right base. There is scarring with volume loss throughout portions of the right lung. The left lung is clear. Heart size and pulmonary vascularity are normal. No adenopathy. There is aortic atherosclerosis. No bone lesions. IMPRESSION: Airspace opacity right base, likely focus of pneumonia. Areas of scarring noted with volume loss in the right lung, stable. Left lung clear. Stable cardiac silhouette. Aortic Atherosclerosis (ICD10-I70.0). Electronically Signed   By: Lowella Grip III M.D.   On: 01/03/2021 17:53    Procedures .Critical Care Performed by: Daleen Bo, MD Authorized by: Daleen Bo, MD   Critical care provider statement:    Critical care time (minutes):  35   Critical care start time:  01/03/2021 9:05 PM   Critical care end time:  01/03/2021 11:01 PM   Critical care time was exclusive of:  Separately billable procedures and treating other patients   Critical care was necessary to treat or prevent imminent or life-threatening deterioration of the following conditions:  Respiratory failure   Critical care was time spent personally by me on the following activities:  Blood draw for specimens, development of treatment plan with patient or surrogate, discussions with consultants, evaluation of patient's response to treatment, examination of patient, obtaining history from patient or surrogate, ordering and performing treatments and interventions, ordering and review of laboratory studies, pulse oximetry, re-evaluation of patient's condition, review of old charts and ordering and review of radiographic studies     Medications Ordered in ED Medications  cefTRIAXone (ROCEPHIN) 2 g in sodium chloride 0.9 % 100 mL IVPB (0 g Intravenous Stopped 01/03/21 2225)  azithromycin (ZITHROMAX) 500 mg in sodium chloride 0.9 % 250 mL IVPB (500 mg Intravenous New Bag/Given 01/03/21 2229)  carvedilol (COREG) tablet 25 mg (has no  administration in time range)  atorvastatin (LIPITOR) tablet 80 mg (has no administration in time range)  sacubitril-valsartan (ENTRESTO) 49-51 mg per tablet (has no administration in time range)  furosemide (LASIX) tablet 20 mg (has no administration in time range)  citalopram (CELEXA) tablet 20 mg (has no administration in time range)  pantoprazole (PROTONIX) EC tablet 40 mg (has no administration in time range)  senna-docusate (Senokot-S) tablet 2 tablet (has no administration in time range)  sodium chloride flush (NS) 0.9 % injection 3 mL (has no administration in time range)  acetaminophen (TYLENOL) tablet 650 mg (has no administration in time range)    Or  acetaminophen (TYLENOL) suppository 650 mg (has no administration in time range)  lactated ringers bolus 1,000 mL (1,000 mLs Intravenous New Bag/Given 01/03/21 2227)    And  lactated ringers bolus 1,000 mL (1,000 mLs Intravenous New Bag/Given 01/03/21 2226)    ED Course  I have reviewed the triage vital signs and the nursing notes.  Pertinent labs & imaging results that were available during my care of the patient were reviewed by me and considered in my medical decision making (see chart for details).    MDM Rules/Calculators/A&P  Patient Vitals for the past 24 hrs:  BP Temp Temp src Pulse Resp SpO2 Height Weight  01/03/21 2135 (!) 105/92 -- -- 92 (!) 21 95 % -- --  01/03/21 1704 -- -- -- -- -- -- 5\' 2"  (1.575 m) 65.8 kg  01/03/21 1701 (!) 152/76 98.8 F (37.1 C) Oral 88 (!) 24 93 % -- --    11:01 PM Reevaluation with update and discussion. After initial assessment and treatment, an updated evaluation reveals change in clinical status, findings discussed and questions answered. Daleen Bo   Medical Decision Making:  This patient is presenting for evaluation of confusion, status post COVID infection, which does require a range of treatment options, and is a complaint that involves a high risk of  morbidity and mortality. The differential diagnoses include persistent COVID infection, secondary infection, encephalopathy. I decided to review old records, and in summary elderly female, recently diagnosed with COVID, did not receive antiviral medication, presenting now with worsening symptoms and cough concerning for bacterial infection..  I obtained additional historical information from daughter at bedside.  Clinical Laboratory Tests Ordered, included CBC, Metabolic panel, Urinalysis and Lactate, blood cultures. Review indicates initial lactate normal, sodium low, chloride low, glucose high, BUN high, albumin low, AST high, ALT high, white count high, hemoglobin low. Radiologic Tests Ordered, included chest x-ray.  I independently Visualized: Radiographic images, which show right lower lobe pneumonia    Critical Interventions-clinical evaluation, laboratory testing, IV fluids, additional labs, observation and reassessment  After These Interventions, the Patient was reevaluated and was found with confusion and right-sided pneumonia likely bacterial/community-acquired/post-COVID.  Screening evaluation consistent with mild hyponatremia, improved from previous.  BUN somewhat elevated but improved from 4 days ago with normal creatinine.  No oxygen requirement, oxygen saturation 93% on room air.  Patient is symptomatic though with shortness of breath and dyspnea on exertion.  She is not improving on doxycycline.  She will be given empiric parenteral antibiotics for immunity acquired pneumonia  CRITICAL CARE-yes Performed by: Daleen Bo  Nursing Notes Reviewed/ Care Coordinated Applicable Imaging Reviewed Interpretation of Laboratory Data incorporated into ED treatment  10:58 PM.  Case discussed with hospitalist will admit the patient for evaluation and treatment.   Final Clinical Impression(s) / ED Diagnoses Final diagnoses:  Community acquired pneumonia of right lower lobe of lung   Encephalopathy  Prolonged Q-T interval on ECG    Rx / DC Orders ED Discharge Orders    None       Daleen Bo, MD 01/03/21 2303    Daleen Bo, MD 01/03/21 2318

## 2021-01-03 NOTE — H&P (Signed)
History and Physical   Helen [name] BOF:[id number] DOB: 11/04/1929 DOA: 01/03/2021  PCP: Vernie Shanks, MD   Patient coming from: [facility] facility  Chief Complaint: Shortness of breath, confusion  HPI: Helen [name] is a 87 y.o. female with medical history significant of anemia, carotid artery stenosis, peripheral vascular disease, heart failure, lung scarring secondary to radiation, hypertension, history of CVA, hyperlipidemia, history of breast cancer presenting with confusion and shortness of breath.  Patient has been recently seen in the ED on 5/9; At that time,  she presented with 7 days of cough and recent positive COVID test done at PCP [facility]). She also had doxycline prescribed during that urgent care visit.  She was found to have an AKI on 5/9 as well (improved today).  Chest x-ray was clear at that as well.  Family reports she has had some confusion since she initially presented to the ED with hallucinations where she is hearing and seeing things that are not there.  Also some intermittent shortness of breath.  She reports some pain in her side with coughing.  She denies fevers, chills, chest pain, abdominal pain, constipation, diarrhea, nausea, vomiting.  ED Course: Vital signs in the ED significant for variable blood pressure in the 778E to 423 systolic.  Respiratory rate in the 20s.  Lab work-up showed CMP with sodium 132 which is chronic, potassium 3.5, chloride 95, BUN 25, glucose 129, albumin 3, AST 60, ALT 70.  CBC with leukocytosis to 16 and hemoglobin 10.6.  Lactic acid normal with repeat pending.  Urinalysis and blood cultures pending.  Chest x-ray showed opacity at the right base consistent with pneumonia and also redemonstrated chronic scarring.  Patient was started on IV antibiotics and given fluid bolus in ED.  Review of Systems: As per HPI otherwise all other systems reviewed and are negative.   Past Medical History:  Diagnosis Date  .  Anemia   . Cancer [facility])    Breast cancer  . Chronic combined systolic (congestive) and diastolic (congestive) heart failure (HCC)    a. EF 25-30% by echo in 11/2016. Recent NST showing no inducible ischemia --> therefore thought to be most consistent with nonischemic cardiomyopathy.   . Diabetes mellitus   . History of stroke 11/2016   incidental finding on MRI  . Hypercholesteremia   . Hypertension   . Peripheral vascular disease ([place]) 2015   bilateral moderate CA disease-Dr Bridgett Larsson  . Stroke [facility])     Past Surgical History:  Procedure Laterality Date  . ABDOMINAL HYSTERECTOMY    . APPENDECTOMY  1951  . BIOPSY  12/13/2019   Procedure: BIOPSY;  Surgeon: Wilford Corner, MD;  Location: [facility] ENDOSCOPY;  Service: Endoscopy;;  . [place]  . CHOLECYSTECTOMY  2003   By Dr. Excell Seltzer  . COLONOSCOPY WITH PROPOFOL N/A 12/13/2019   Procedure: COLONOSCOPY WITH PROPOFOL;  Surgeon: Wilford Corner, MD;  Location: [facility] ENDOS[facility];  Service: Endoscopy;  Laterality: N/A;  . ESOPHAGOGASTRODUODENOSCOPY N/A 12/13/2019   Procedure: ESOPHAGOGASTRODUODENOSCOPY (EGD);  Surgeon: Wilford Corner, MD;  Location: [facility];  Service: Endoscopy;  Laterality: N/A;  . EYE SURGERY Bilateral 2009   Cataract surgery  . FRACTURE SURGERY Right    ORIF   by Dr. Burney Gauze  . HUMERUS FRACTURE SURGERY    . MASTECTOMY Bilateral   . NASAL SEPTUM SURGERY  1977   Dr. Ernesto Rutherford  . POLYPECTOMY  12/13/2019   Procedure: POLYPECTOMY;  Surgeon: Wilford Corner, MD;  Location:  [facility];  Service: Endoscopy;;  . TOENAIL EXCISION      Social History  reports that she quit smoking about 42 years ago. She has never used smokeless tobacco. She reports that she does not drink alcohol and does not use drugs.  Allergies  Allergen Reactions  . Morphine And Related Nausea And Vomiting  . Other Other (See Comments)    Other reaction(s): Other (See Comments) Barley & Carrots: learned through allergy testing.  Unknown reaction-- MD said not very allergic but not to eat large quantities Barley & Carrots: learned through allergy testing. Unknown reaction-- MD said not very allergic but not to eat large quantities  . Sulfa Antibiotics Hives  . Sulfasalazine Hives  . Nickel Rash    Family History  Problem Relation Age of Onset  . Cancer Mother   . Pulmonary embolism Mother   . Heart disease Father   . Heart attack Father   . Stroke Father   . Diabetes Brother   . Hyperlipidemia Brother   . Stroke Brother   . Kidney disease Brother   Reviewed on admission  Prior to Admission medications   Medication Sig Start Date End Date Taking? Authorizing Provider  atorvastatin (LIPITOR) 80 MG tablet Take 80 mg by mouth at bedtime.    [provider]  BIOTIN PO Take 1 tablet by mouth daily.    [provider]  bismuth subsalicylate (PEPTO BISMOL) 262 MG chewable tablet Chew 524 mg by mouth as needed for indigestion or diarrhea or loose stools.    [provider]  carvedilol (COREG) 25 MG tablet TAKE 1 TABLET BY MOUTH  TWICE DAILY WITH MEALS 08/06/20   Chilton Si, MD  cholecalciferol (VITAMIN D) 1000 units tablet Take 1,000 Units by mouth every evening.     [provider]  citalopram (CELEXA) 20 MG tablet Take 20 mg by mouth daily. 12/01/19   [provider]  ELIQUIS 5 MG TABS tablet TAKE 1 TABLET BY MOUTH  TWICE DAILY 06/19/20   Chilton Si, MD  ENTRESTO 49-51 MG TAKE 1 TABLET BY MOUTH  TWICE DAILY 03/27/20   Chilton Si, MD  furosemide (LASIX) 20 MG tablet TAKE 1 TABLET BY MOUTH  DAILY 03/27/20   Chilton Si, MD  magnesium oxide (MAG-OX) 400 MG tablet 1 tablet by mouth twice a day for 1 week and then 1 daily Patient taking differently: Take 400 mg by mouth daily.  03/24/18   Chilton Si, MD  Multiple Vitamin (MULTIVITAMIN WITH MINERALS) TABS tablet Take 1 tablet by mouth daily.     [provider]  pantoprazole (PROTONIX) 40 MG  tablet Take 1 tablet (40 mg total) by mouth 2 (two) times daily before a meal. 12/14/19   Amin, Loura Halt, MD  senna-docusate (SENOKOT-S) 8.6-50 MG tablet Take 2 tablets by mouth at bedtime as needed for mild constipation or moderate constipation. 12/14/19   Amin, Loura Halt, MD  vitamin C (ASCORBIC ACID) 500 MG tablet Take 500 mg by mouth daily.     [provider]    Physical Exam: Vitals:   01/03/21 1701 01/03/21 1704 01/03/21 2135  BP: (!) 152/76  (!) 105/92  Pulse: 88  92  Resp: (!) 24  (!) 21  Temp: 98.8 F (37.1 C)    TempSrc: Oral    SpO2: 93%  95%  Weight:  65.8 kg   Height:  5\' 2"  (1.575 m)    Physical Exam Constitutional:      General: She  is not in acute distress.    Appearance: She is ill-appearing.  HENT:     Head: Normocephalic and atraumatic.     Mouth/Throat:     Mouth: Mucous membranes are moist.     Pharynx: Oropharynx is clear.  Eyes:     Extraocular Movements: Extraocular movements intact.     Pupils: Pupils are equal, round, and reactive to light.  Cardiovascular:     Rate and Rhythm: Normal rate and regular rhythm.     Pulses: Normal pulses.     Heart sounds: Normal heart sounds.  Pulmonary:     Effort: Pulmonary effort is normal. No respiratory distress.     Breath sounds: Normal breath sounds.  Abdominal:     General: Bowel sounds are normal. There is no distension.     Palpations: Abdomen is soft.     Tenderness: There is no abdominal tenderness.  Musculoskeletal:        General: No swelling or deformity.  Skin:    General: Skin is warm and dry.  Neurological:     General: No focal deficit present.     Mental Status: Mental status is at baseline.    Labs on Admission: I have personally reviewed following labs and imaging studies  CBC: Recent Labs  Lab 12/30/20 2211 01/03/21 1933  WBC 17.4* 16.1*  NEUTROABS 14.6* 13.5*  HGB 10.4* 10.6*  HCT 31.7* 32.3*  MCV 90.8 91.2  PLT 161 315    Basic Metabolic Panel: Recent  Labs  Lab 12/30/20 2211 01/03/21 1933 01/03/21 2303  NA 134* 132*  --   K 4.0 3.5  --   CL 97* 95*  --   CO2 27 27  --   GLUCOSE 134* 129*  --   BUN 35* 25*  --   CREATININE 1.59* 0.95  --   CALCIUM 8.9 8.9  --   MG  --   --  1.5*    GFR: Estimated Creatinine Clearance: 35.7 mL/min (by C-G formula based on SCr of 0.95 mg/dL).  Liver Function Tests: Recent Labs  Lab 12/30/20 2211 01/03/21 1933  AST 26 60*  ALT 18 70*  ALKPHOS 52 64  BILITOT 0.5 0.7  PROT 7.6 7.8  ALBUMIN 3.5 3.0*    Urine analysis:    Component Value Date/Time   COLORURINE YELLOW 11/12/2016 0105   APPEARANCEUR Clear 03/12/2017 1440   LABSPEC 1.015 11/12/2016 0105   PHURINE 5.0 11/12/2016 0105   GLUCOSEU Negative 03/12/2017 1440   HGBUR NEGATIVE 11/12/2016 0105   BILIRUBINUR Negative 03/12/2017 1440   KETONESUR NEGATIVE 11/12/2016 0105   PROTEINUR Negative 03/12/2017 1440   PROTEINUR 100 (A) 11/12/2016 0105   NITRITE Negative 03/12/2017 1440   NITRITE NEGATIVE 11/12/2016 0105   LEUKOCYTESUR 2+ (A) 03/12/2017 1440    Radiological Exams on Admission: DG Chest 1 View  Result Date: 01/03/2021 CLINICAL DATA:  Shortness of breath.  Confusion. EXAM: CHEST  1 VIEW COMPARISON:  Dec 30, 2020 FINDINGS: There is airspace opacity in the right base. There is scarring with volume loss throughout portions of the right lung. The left lung is clear. Heart size and pulmonary vascularity are normal. No adenopathy. There is aortic atherosclerosis. No bone lesions. IMPRESSION: Airspace opacity right base, likely focus of pneumonia. Areas of scarring noted with volume loss in the right lung, stable. Left lung clear. Stable cardiac silhouette. Aortic Atherosclerosis (ICD10-I70.0). Electronically Signed   By: Lowella Grip III M.D.   On: 01/03/2021 17:53   EKG:  Unable to be reviewed due to technical difficulties with EMR  Assessment/Plan Principal Problem:   CAP (community acquired pneumonia) Active Problems:    Carotid stenosis   Hypercholesteremia   Diabetes mellitus without complication ([place])   Peripheral vascular disease (HCC)   Acute encephalopathy   Hyponatremia   Essential hypertension   Anemia of chronic disease   Chronic combined systolic and diastolic heart failure (HCC)   Shortness of breath  COVID-19 infection Metabolic encephalopathy Community-acquired pneumonia > Patient presenting with confusion and some intermittent shortness of breath with pulse ox measurements in the low 90s. > Diagnosed with COVID-19 on the ninth with 7 days of symptoms.  Has not received in a antivirals that she was reportedly outside the window based on her symptoms. > Is around 14 days from onset of symptoms we will continue with precautions for now > Has new right base opacity not shown on previous chest x-ray on the ninth of this month. > Has been started on doxycycline per reports but has failed this therapy as she continues to have this opacity and leukocytosis - Monitor on telemetry - Continue with ceftriaxone and azithromycin - Monitor fever curve and white count  CHF Hypertension > History of CHF last echo I see in the system was from 2018 with a EF 25 to 30% - We will check a BNP and hold off on any further fluids - Consider repeat ECHO as it appears to have been year since her last ECHO - Continue home Coreg, Entresto, Lasix - Trend renal function and electrolytes  Hyponatremia > Sodium noted to be 132 in the ED which is chronic and stable for her - We will continue to monitor  Carotid stenosis Peripheral vascular disease Hyperlipidemia, history of CVA -Continue home atorvastatin -Continue home Eliquis  Anemia > Hemoglobin 10.6 in ED, stable - Continue to monitor CBC  Transaminitis > Mild transaminitis with AST of 60 and ALT of 70.  May be due to recent/ongoing COVID infection. - Continue to monitor  DVT prophylaxis: Eliquis  Code Status:   DNR Family Communication:  Daughter  updated at bedside  Disposition Plan:   Patient is from:  Independent living  Anticipated DC to:  Independent living  Anticipated DC date:  1 to 3 days  Anticipated DC barriers: None  Consults called:  None  Admission status:  Observation, telemetry  Severity of Illness: The appropriate patient status for this patient is OBSERVATION. Observation status is judged to be reasonable and necessary in order to provide the required intensity of service to ensure the patient's safety. The patient's presenting symptoms, physical exam findings, and initial radiographic and laboratory data in the context of their medical condition is felt to place them at decreased risk for further clinical deterioration. Furthermore, it is anticipated that the patient will be medically stable for discharge from the hospital within 2 midnights of admission. The following factors support the patient status of observation.   " The patient's presenting symptoms include confusion and intermittent shortness of breath. " The physical exam findings include mild confusion, stable physical exam. " The initial radiographic and laboratory data are chest x-ray with opacity at the right base.  Hemoglobin 10.6, WBC 16, sodium 132, glucose 129, AST 60, ALT 70.[name]   Marcelyn Bruins MD [facility]  How to contact the [facility] Attending or Consulting provider [place] or covering provider during after hours [place], for this patient?   1. Check the care team in [facility] and  look for a) attending/consulting [facility] provider listed and b) the [facility] team listed 2. Log into [email or web address] and use [place] universal password to access. If you do not have the password, please contact the hospital operator. 3. Locate the [facility] provider you are looking for under [facility] and page to a number that you can be directly reached. 4. If you still have difficulty reaching the provider, please page the [facility] (Director on Call) for the Hospitalists  listed on amion for assistance.  01/03/2021, 11:53 PM

## 2021-01-04 ENCOUNTER — Encounter (HOSPITAL_COMMUNITY): Payer: Self-pay | Admitting: Family Medicine

## 2021-01-04 DIAGNOSIS — Z8673 Personal history of transient ischemic attack (TIA), and cerebral infarction without residual deficits: Secondary | ICD-10-CM | POA: Diagnosis not present

## 2021-01-04 DIAGNOSIS — Z83438 Family history of other disorder of lipoprotein metabolism and other lipidemia: Secondary | ICD-10-CM | POA: Diagnosis not present

## 2021-01-04 DIAGNOSIS — Z853 Personal history of malignant neoplasm of breast: Secondary | ICD-10-CM | POA: Diagnosis not present

## 2021-01-04 DIAGNOSIS — Z8249 Family history of ischemic heart disease and other diseases of the circulatory system: Secondary | ICD-10-CM | POA: Diagnosis not present

## 2021-01-04 DIAGNOSIS — Z9049 Acquired absence of other specified parts of digestive tract: Secondary | ICD-10-CM | POA: Diagnosis not present

## 2021-01-04 DIAGNOSIS — Z823 Family history of stroke: Secondary | ICD-10-CM | POA: Diagnosis not present

## 2021-01-04 DIAGNOSIS — A0839 Other viral enteritis: Secondary | ICD-10-CM | POA: Diagnosis not present

## 2021-01-04 DIAGNOSIS — U071 COVID-19: Principal | ICD-10-CM

## 2021-01-04 DIAGNOSIS — I739 Peripheral vascular disease, unspecified: Secondary | ICD-10-CM | POA: Diagnosis not present

## 2021-01-04 DIAGNOSIS — E871 Hypo-osmolality and hyponatremia: Secondary | ICD-10-CM | POA: Diagnosis not present

## 2021-01-04 DIAGNOSIS — E1151 Type 2 diabetes mellitus with diabetic peripheral angiopathy without gangrene: Secondary | ICD-10-CM | POA: Diagnosis not present

## 2021-01-04 DIAGNOSIS — Z90710 Acquired absence of both cervix and uterus: Secondary | ICD-10-CM | POA: Diagnosis not present

## 2021-01-04 DIAGNOSIS — R9431 Abnormal electrocardiogram [ECG] [EKG]: Secondary | ICD-10-CM | POA: Diagnosis not present

## 2021-01-04 DIAGNOSIS — J189 Pneumonia, unspecified organism: Secondary | ICD-10-CM | POA: Diagnosis not present

## 2021-01-04 DIAGNOSIS — Z833 Family history of diabetes mellitus: Secondary | ICD-10-CM | POA: Diagnosis not present

## 2021-01-04 DIAGNOSIS — J1282 Pneumonia due to coronavirus disease 2019: Secondary | ICD-10-CM | POA: Diagnosis not present

## 2021-01-04 DIAGNOSIS — Z841 Family history of disorders of kidney and ureter: Secondary | ICD-10-CM | POA: Diagnosis not present

## 2021-01-04 DIAGNOSIS — Z9842 Cataract extraction status, left eye: Secondary | ICD-10-CM | POA: Diagnosis not present

## 2021-01-04 DIAGNOSIS — Z9841 Cataract extraction status, right eye: Secondary | ICD-10-CM | POA: Diagnosis not present

## 2021-01-04 DIAGNOSIS — E119 Type 2 diabetes mellitus without complications: Secondary | ICD-10-CM | POA: Diagnosis not present

## 2021-01-04 DIAGNOSIS — J9621 Acute and chronic respiratory failure with hypoxia: Secondary | ICD-10-CM | POA: Diagnosis not present

## 2021-01-04 DIAGNOSIS — I10 Essential (primary) hypertension: Secondary | ICD-10-CM | POA: Diagnosis not present

## 2021-01-04 DIAGNOSIS — Y92239 Unspecified place in hospital as the place of occurrence of the external cause: Secondary | ICD-10-CM | POA: Diagnosis not present

## 2021-01-04 DIAGNOSIS — E7800 Pure hypercholesterolemia, unspecified: Secondary | ICD-10-CM | POA: Diagnosis present

## 2021-01-04 DIAGNOSIS — I5042 Chronic combined systolic (congestive) and diastolic (congestive) heart failure: Secondary | ICD-10-CM | POA: Diagnosis not present

## 2021-01-04 DIAGNOSIS — Z66 Do not resuscitate: Secondary | ICD-10-CM | POA: Diagnosis not present

## 2021-01-04 DIAGNOSIS — G9340 Encephalopathy, unspecified: Secondary | ICD-10-CM | POA: Diagnosis not present

## 2021-01-04 DIAGNOSIS — D638 Anemia in other chronic diseases classified elsewhere: Secondary | ICD-10-CM | POA: Diagnosis not present

## 2021-01-04 DIAGNOSIS — I110 Hypertensive heart disease with heart failure: Secondary | ICD-10-CM | POA: Diagnosis not present

## 2021-01-04 DIAGNOSIS — Z9013 Acquired absence of bilateral breasts and nipples: Secondary | ICD-10-CM | POA: Diagnosis not present

## 2021-01-04 DIAGNOSIS — R443 Hallucinations, unspecified: Secondary | ICD-10-CM | POA: Diagnosis not present

## 2021-01-04 DIAGNOSIS — G9341 Metabolic encephalopathy: Secondary | ICD-10-CM | POA: Diagnosis not present

## 2021-01-04 LAB — URINALYSIS, ROUTINE W REFLEX MICROSCOPIC
Bacteria, UA: NONE SEEN
Bilirubin Urine: NEGATIVE
Glucose, UA: NEGATIVE mg/dL
Hgb urine dipstick: NEGATIVE
Ketones, ur: 5 mg/dL — AB
Leukocytes,Ua: NEGATIVE
Nitrite: NEGATIVE
Protein, ur: NEGATIVE mg/dL
Specific Gravity, Urine: 1.016 (ref 1.005–1.030)
pH: 5 (ref 5.0–8.0)

## 2021-01-04 LAB — CBC
HCT: 25.1 % — ABNORMAL LOW (ref 36.0–46.0)
Hemoglobin: 8.2 g/dL — ABNORMAL LOW (ref 12.0–15.0)
MCH: 29.8 pg (ref 26.0–34.0)
MCHC: 32.7 g/dL (ref 30.0–36.0)
MCV: 91.3 fL (ref 80.0–100.0)
Platelets: 147 10*3/uL — ABNORMAL LOW (ref 150–400)
RBC: 2.75 MIL/uL — ABNORMAL LOW (ref 3.87–5.11)
RDW: 16.7 % — ABNORMAL HIGH (ref 11.5–15.5)
WBC: 12.6 10*3/uL — ABNORMAL HIGH (ref 4.0–10.5)
nRBC: 0 % (ref 0.0–0.2)

## 2021-01-04 LAB — COMPREHENSIVE METABOLIC PANEL
ALT: 52 U/L — ABNORMAL HIGH (ref 0–44)
AST: 39 U/L (ref 15–41)
Albumin: 2.3 g/dL — ABNORMAL LOW (ref 3.5–5.0)
Alkaline Phosphatase: 53 U/L (ref 38–126)
Anion gap: 9 (ref 5–15)
BUN: 22 mg/dL (ref 8–23)
CO2: 27 mmol/L (ref 22–32)
Calcium: 8.1 mg/dL — ABNORMAL LOW (ref 8.9–10.3)
Chloride: 97 mmol/L — ABNORMAL LOW (ref 98–111)
Creatinine, Ser: 0.82 mg/dL (ref 0.44–1.00)
GFR, Estimated: >60 mL/min (ref >60)
Glucose, Bld: 115 mg/dL — ABNORMAL HIGH (ref 70–99)
Potassium: 3.5 mmol/L (ref 3.5–5.1)
Sodium: 133 mmol/L — ABNORMAL LOW (ref 135–145)
Total Bilirubin: 0.5 mg/dL (ref 0.3–1.2)
Total Protein: 6 g/dL — ABNORMAL LOW (ref 6.5–8.1)

## 2021-01-04 LAB — RESP PANEL BY RT-PCR (FLU A&B, COVID) ARPGX2
Influenza A by PCR: NEGATIVE
Influenza B by PCR: NEGATIVE
SARS Coronavirus 2 by RT PCR: POSITIVE — AB

## 2021-01-04 LAB — BRAIN NATRIURETIC PEPTIDE: B Natriuretic Peptide: 1014.2 pg/mL — ABNORMAL HIGH (ref 0.0–100.0)

## 2021-01-04 MED ORDER — LINAGLIPTIN 5 MG PO TABS
5.0000 mg | ORAL_TABLET | Freq: Every day | ORAL | Status: DC
  Administered 2021-01-05 – 2021-01-10 (×6): 5 mg via ORAL
  Filled 2021-01-04 (×6): qty 1

## 2021-01-04 MED ORDER — MAGNESIUM OXIDE -MG SUPPLEMENT 400 (240 MG) MG PO TABS
400.0000 mg | ORAL_TABLET | Freq: Two times a day (BID) | ORAL | Status: DC
  Administered 2021-01-04 – 2021-01-05 (×2): 400 mg via ORAL
  Filled 2021-01-04 (×2): qty 1

## 2021-01-04 MED ORDER — DEXAMETHASONE 4 MG PO TABS
6.0000 mg | ORAL_TABLET | Freq: Every day | ORAL | Status: DC
  Administered 2021-01-04 – 2021-01-09 (×6): 6 mg via ORAL
  Filled 2021-01-04 (×6): qty 1

## 2021-01-04 NOTE — ED Notes (Signed)
Patient resting comfortably in NAD. No urine noted in suction canister.

## 2021-01-04 NOTE — ED Notes (Signed)
Pt given meal tray.

## 2021-01-04 NOTE — Progress Notes (Signed)
Code Sepsis initiated @ 2140 PM, ELINK following.

## 2021-01-04 NOTE — Progress Notes (Signed)
Patient admitted to floor, via bed from ED, with transport.  Patient accompanied by daughter Lacie Scotts for calls is [facility], patient is alert and orientedx4, denies pain on admission. 4 Eyed skin assessment done with Nira Conn RN. No open area noted.  IV patient no IVF running  Patient OOB to Bedside commode with 1 assist patient has some generalized weakness, unable to void in bed was Bladder scanned for 212 mL.   Please see Flowsheets and MAR for further assessment.  Paul Dykes

## 2021-01-04 NOTE — Progress Notes (Signed)
[facility] PROGRESS NOTE    Helen [name]  WSF:[id number] DOB: 1929/11/25 DOA: 01/03/2021 PCP: Vernie Shanks, MD      Brief Narrative:  [name] is a 87 y.o. F with dsCHF EF 25-30% in 2018, HTN, pAF on Eliquis, HTN, DM, hx stroke, PVD, BrCA s/p chemo and double mastectomy and hx hyponatremia who presented with cough, confusion.  Patient developed sore throat congestion and cough around 5/2.  She thought this was "a cold" because she had not been out of her apartment, but she progressively got worse, went to [facility] urgent care, tested positive for COVID on 5/8.  By 5/9 she felt severe fatigue and weakness, and some intermittent confusion so she went to the ER where her electrolytes were normal, oxygen level was normal, and appeared stable for discharge.  Now the last 5 days, she is gotten progressively weaker, more frequently confused, and her cough is persisted.  Here in the ER, chest imaging showed right base opacity.  24, white count 16 K, SpO2 88% on room air.  Started on empiric antibiotics and admitted to hospitalist service.       Assessment & Plan:  COVID-19 Symptoms worsening 7-10 days after diagnosis in this 87 yo F vaccinated and boosted last fall.  Likely breakthrough.  At this point, >10 days from initial symptoms, low likelihood of benefit from nirmeatrelvir or remdesivir.    -Start dexamethasone 6 mg once daily -Check CRP -PT eval -Dietitian    Acute hypoxic respiratory failure Presented with respiratory distress, respiratory rate 24-27, SPO2 88% on room air.  Due to COVID or CAP.  Acute metabolic encephalopathy and asthenia due to COVID At abseline, patient has no memory issues and lives in independently living, but since contracting covid, she has fatigue, weakness, and is confused, having bizarre delusions about her neighbor and daughter and staff at her facility have noticed disoriented thoughts   Community acquired pneumonia  RLL -Continue ceftraixone and azithromycin for now, follow QT  Hyperetnison Chronic systolic and diastolic CHF Peripheral vascular disease -Continue atorvastatin, carvedilol -Continue furosemide -Continue Entresto  Paroxysmal atrial fibrillation HR controlled -Continue apixaban -Continue carvedilol  Prolonged QTc EKG on admission showed QTc >500 -Repeat EKG  Chart history diet controlled diabetes -Check A1c  -Monitor AM sugar, if elevated >125, will start linagliptin and SS corrections  GERD -Continue PPI  Hyponatremia Mild, asymptomatic  Hypomagnesemia -Supplement Mag  Anemia of chornic disease No clinical bleeding, hemoglobin slightly lower than baseline,   Depression -Hold home citalopram      Disposition: Status is: Inpatient  Remains inpatient appropriate beca use:87 yo F with COVID, ongoing confusion, and pneumonia   Dispo: The patient is from: independent living              Anticipated d/c is to: Home              Patient currently is not medically stable to d/c.   Difficult to place patient No       Level of care: Med-Surg       MDM: The below labs and imaging reports were reviewed and summarized above.  Medication management as above.    DVT prophylaxis:  apixaban (ELIQUIS) tablet 5 mg  Code Status: DNR Family Communication: Daughter at bedside           Subjective: Coughing up mucus.  No further fever.  Intermittently confused.  No vomiting.  No chest pain.  No abdominal pain.   Objective: Vitals:  01/04/21 1130 01/04/21 1200 01/04/21 1530 01/04/21 1625  BP: (!) 151/59 (!) 124/102 140/68 (!) 142/76  Pulse: 87 75 89   Resp: (!) 27 (!) 25 (!) 22   Temp:      TempSrc:      SpO2: 100% 100% (!) 89% 100%  Weight:      Height:        Intake/Output Summary (Last 24 hours) at 01/04/2021 1639 Last data filed at 01/04/2021 1126 Gross per 24 hour  Intake 250.12 ml  Output 800 ml  Net -549.88 ml   Filed Weights    01/03/21 1704  Weight: 65.8 kg    Examination: General appearance: Elderly adult female, alert and in no acute distress.   HEENT: Anicteric, conjunctiva pink, lids and lashes normal. No nasal deformity, discharge, epistaxis.  Lips moist, dentition in good repair, oropharynx moist, no oral lesions, hearing slightly diminished.   Skin: Warm and dry.  No jaundice.  No suspicious rashes or lesions. Cardiac: RRR, nl S1-S2, no murmurs appreciated.  Capillary refill is brisk.  JVP not visible.  No LE edema.  Radial pulses 2+ and symmetric. Respiratory: Normal respiratory rate and rhythm.  Faint bibasilar rales, no wheezing Abdomen: Abdomen soft.  No TTP or guarding. No ascites, distension, hepatosplenomegaly.   MSK: No deformities or effusions. Neuro: Awake and alert.  EOMI, moves all extremities generalized weakness but symmetric strength, face symmetric. Speech fluent.    Psych: Sensorium intact and responding to questions, attention normal. Affect pleasant.  Judgment and insight appear normal.    Data Reviewed: I have personally reviewed following labs and imaging studies:  CBC: Recent Labs  Lab 12/30/20 2211 01/03/21 1933 01/04/21 0533  WBC 17.4* 16.1* 12.6*  NEUTROABS 14.6* 13.5*  --   HGB 10.4* 10.6* 8.2*  HCT 31.7* 32.3* 25.1*  MCV 90.8 91.2 91.3  PLT 161 201 563*   Basic Metabolic Panel: Recent Labs  Lab 12/30/20 2211 01/03/21 1933 01/03/21 2303 01/04/21 0533  NA 134* 132*  --  133*  K 4.0 3.5  --  3.5  CL 97* 95*  --  97*  CO2 27 27  --  27  GLUCOSE 134* 129*  --  115*  BUN 35* 25*  --  22  CREATININE 1.59* 0.95  --  0.82  CALCIUM 8.9 8.9  --  8.1*  MG  --   --  1.5*  --    GFR: Estimated Creatinine Clearance: 41.4 mL/min (by C-G formula based on SCr of 0.82 mg/dL). Liver Function Tests: Recent Labs  Lab 12/30/20 2211 01/03/21 1933 01/04/21 0533  AST 26 60* 39  ALT 18 70* 52*  ALKPHOS 52 64 53  BILITOT 0.5 0.7 0.5  PROT 7.6 7.8 6.0*  ALBUMIN 3.5 3.0*  2.3*   No results for input(s): LIPASE, AMYLASE in the last 168 hours. No results for input(s): AMMONIA in the last 168 hours. Coagulation Profile: No results for input(s): INR, PROTIME in the last 168 hours. Cardiac Enzymes: No results for input(s): CKTOTAL, CKMB, CKMBINDEX, TROPONINI in the last 168 hours. BNP (last 3 results) No results for input(s): PROBNP in the last 8760 hours. HbA1C: No results for input(s): HGBA1C in the last 72 hours. CBG: Recent Labs  Lab 01/03/21 2214  GLUCAP 120*   Lipid Profile: No results for input(s): CHOL, HDL, LDLCALC, TRIG, CHOLHDL, LDLDIRECT in the last 72 hours. Thyroid Function Tests: No results for input(s): TSH, T4TOTAL, FREET4, T3FREE, THYROIDAB in the last 72 hours. Anemia Panel:  No results for input(s): VITAMINB12, FOLATE, FERRITIN, TIBC, IRON, RETICCTPCT in the last 72 hours. Urine analysis:    Component Value Date/Time   COLORURINE YELLOW 11/12/2016 0105   APPEARANCEUR Clear 03/12/2017 1440   LABSPEC 1.015 11/12/2016 0105   PHURINE 5.0 11/12/2016 0105   GLUCOSEU Negative 03/12/2017 1440   HGBUR NEGATIVE 11/12/2016 0105   BILIRUBINUR Negative 03/12/2017 1440   KETONESUR NEGATIVE 11/12/2016 0105   PROTEINUR Negative 03/12/2017 1440   PROTEINUR 100 (A) 11/12/2016 0105   NITRITE Negative 03/12/2017 1440   NITRITE NEGATIVE 11/12/2016 0105   LEUKOCYTESUR 2+ (A) 03/12/2017 1440   Sepsis Labs: _0 (procalcitonin:4,lacticacidven:4)  ) Recent Results (from the past 240 hour(s))  Culture, blood (single)     Status: None (Preliminary result)   Collection Time: 01/03/21  7:33 PM   Specimen: BLOOD  Result Value Ref Range Status   Specimen Description   Final    BLOOD RIGHT ANTECUBITAL Performed at [facility], [place] [address], [place], [place] [address]    Special Requests   Final    BOTTLES DRAWN AEROBIC AND ANAEROBIC Blood Culture adequate volume Performed at [facility]  [address], [place], [place] [address]    Culture   Final    NO GROWTH < 24 HOURS Performed at [facility] [address] [place], [place] [address]    Report Status PENDING  Incomplete  Culture, blood (single)     Status: None (Preliminary result)   Collection Time: 01/03/21 10:20 PM   Specimen: BLOOD RIGHT FOREARM  Result Value Ref Range Status   Specimen Description   Final    BLOOD RIGHT FOREARM Performed at [facility], [place] [address] [place], [place] [address]    Special Requests   Final    BOTTLES DRAWN AEROBIC AND ANAEROBIC Blood Culture adequate volume Performed at [facility] [address] [place], [place] [address]    Culture   Final    NO GROWTH < 12 HOURS Performed at [facility] [address], [place], [place] [address]    Report Status PENDING  Incomplete  Resp Panel by RT-PCR (Flu A&B, Covid) Nasopharyngeal Swab     Status: Abnormal   Collection Time: 01/04/21  9:49 AM   Specimen: Nasopharyngeal Swab; Nasopharyngeal(NP) swabs in vial transport medium  Result Value Ref Range Status   SARS Coronavirus 2 by RT PCR POSITIVE (A) NEGATIVE Final    Comment: RESULT CALLED TO, READ BACK BY AND VERIFIED WITH: LITHICUM,E. RN AT 1212 01/04/21 MULLINS,T (NOTE) SARS-CoV-2 target nucleic acids are DETECTED.  The SARS-CoV-2 RNA is generally detectable in upper respiratory specimens during the acute phase of infection. Positive results are indicative of the presence of the identified virus, but do not rule out bacterial infection or co-infection with other pathogens not detected by the test. Clinical correlation with patient history and other diagnostic information is necessary to determine patient infection status. The expected result is Negative.  Fact Sheet for Patients: EntrepreneurPulse.com.au  Fact Sheet for Healthcare Providers: IncredibleEmployment.be  This test is not yet approved  or cleared by the [place] FDA and  has been authorized for detection and/or diagnosis of SARS-CoV-2 by FDA under an Emergency Use Authorization (EUA).  This EUA will remain in effect (meaning this tes t can be used) for the duration of  the COVID-19 declaration under Section 564(b)(1) of the Act, 21 U.S.C. section 360bbb-3(b)(1), unless the authorization is terminated or revoked  sooner.     Influenza A by PCR NEGATIVE NEGATIVE Final   Influenza B by PCR NEGATIVE NEGATIVE Final    Comment: (NOTE) The Xpert Xpress SARS-CoV-2/FLU/RSV plus assay is intended as an aid in the diagnosis of influenza from Nasopharyngeal swab specimens and should not be used as a sole basis for treatment. Nasal washings and aspirates are unacceptable for Xpert Xpress SARS-CoV-2/FLU/RSV testing.  Fact Sheet for Patients: EntrepreneurPulse.com.au  Fact Sheet for Healthcare Providers: IncredibleEmployment.be  This test is not yet approved or cleared by the [place] FDA and has been authorized for detection and/or diagnosis of SARS-CoV-2 by FDA under an Emergency Use Authorization (EUA). This EUA will remain in effect (meaning this test can be used) for the duration of the COVID-19 declaration under Section 564(b)(1) of the Act, 21 U.S.C. section 360bbb-3(b)(1), unless the authorization is terminated or revoked.  Performed at [facility], [place] [address], [place], [place] [address]          Radiology Studies: DG Chest 1 View  Result Date: 01/03/2021 CLINICAL DATA:  Shortness of breath.  Confusion. EXAM: CHEST  1 VIEW COMPARISON:  Dec 30, 2020 FINDINGS: There is airspace opacity in the right base. There is scarring with volume loss throughout portions of the right lung. The left lung is clear. Heart size and pulmonary vascularity are normal. No adenopathy. There is aortic atherosclerosis. No bone lesions. IMPRESSION: Airspace opacity right  base, likely focus of pneumonia. Areas of scarring noted with volume loss in the right lung, stable. Left lung clear. Stable cardiac silhouette. Aortic Atherosclerosis (ICD10-I70.0). Electronically Signed   By: Lowella Grip III M.D.   On: 01/03/2021 17:53        Scheduled Meds: . apixaban  5 mg Oral BID  . atorvastatin  80 mg Oral QHS  . carvedilol  25 mg Oral BID WC  . citalopram  20 mg Oral Daily  . dexamethasone  6 mg Oral Daily  . furosemide  20 mg Oral Daily  . pantoprazole  40 mg Oral BID AC  . sacubitril-valsartan  1 tablet Oral BID  . sodium chloride flush  3 mL Intravenous Q12H   Continuous Infusions: . azithromycin Stopped (01/03/21 2334)  . cefTRIAXone (ROCEPHIN)  IV Stopped (01/03/21 2225)     LOS: 0 days    Time spent: 35 minutes    Edwin Dada, MD [facility] 01/04/2021, 4:39 PM     Please page though [place] or Epic secure chat:  For [employer], Adult nurse

## 2021-01-04 NOTE — ED Notes (Signed)
Pt in bed resting, respirations even and unlabored. Family at bedside

## 2021-01-05 LAB — COMPREHENSIVE METABOLIC PANEL
ALT: 51 U/L — ABNORMAL HIGH (ref 0–44)
AST: 31 U/L (ref 15–41)
Albumin: 2.1 g/dL — ABNORMAL LOW (ref 3.5–5.0)
Alkaline Phosphatase: 50 U/L (ref 38–126)
Anion gap: 6 (ref 5–15)
BUN: 24 mg/dL — ABNORMAL HIGH (ref 8–23)
CO2: 28 mmol/L (ref 22–32)
Calcium: 7.6 mg/dL — ABNORMAL LOW (ref 8.9–10.3)
Chloride: 101 mmol/L (ref 98–111)
Creatinine, Ser: 0.85 mg/dL (ref 0.44–1.00)
GFR, Estimated: >60 mL/min (ref >60)
Glucose, Bld: 179 mg/dL — ABNORMAL HIGH (ref 70–99)
Potassium: 4 mmol/L (ref 3.5–5.1)
Sodium: 135 mmol/L (ref 135–145)
Total Bilirubin: 0.5 mg/dL (ref 0.3–1.2)
Total Protein: 5.4 g/dL — ABNORMAL LOW (ref 6.5–8.1)

## 2021-01-05 LAB — CBC
HCT: 25.9 % — ABNORMAL LOW (ref 36.0–46.0)
Hemoglobin: 8.4 g/dL — ABNORMAL LOW (ref 12.0–15.0)
MCH: 29.8 pg (ref 26.0–34.0)
MCHC: 32.4 g/dL (ref 30.0–36.0)
MCV: 91.8 fL (ref 80.0–100.0)
Platelets: 164 10*3/uL (ref 150–400)
RBC: 2.82 MIL/uL — ABNORMAL LOW (ref 3.87–5.11)
RDW: 16.5 % — ABNORMAL HIGH (ref 11.5–15.5)
WBC: 11.4 10*3/uL — ABNORMAL HIGH (ref 4.0–10.5)
nRBC: 0 % (ref 0.0–0.2)

## 2021-01-05 LAB — GLUCOSE, CAPILLARY
Glucose-Capillary: 157 mg/dL — ABNORMAL HIGH (ref 70–99)
Glucose-Capillary: 162 mg/dL — ABNORMAL HIGH (ref 70–99)
Glucose-Capillary: 177 mg/dL — ABNORMAL HIGH (ref 70–99)
Glucose-Capillary: 199 mg/dL — ABNORMAL HIGH (ref 70–99)

## 2021-01-05 LAB — HEMOGLOBIN A1C
Hgb A1c MFr Bld: 6.8 % — ABNORMAL HIGH (ref 4.8–5.6)
Mean Plasma Glucose: 148.46 mg/dL

## 2021-01-05 LAB — C-REACTIVE PROTEIN: CRP: 8 mg/dL — ABNORMAL HIGH (ref <1.0)

## 2021-01-05 LAB — MAGNESIUM: Magnesium: 1.4 mg/dL — ABNORMAL LOW (ref 1.7–2.4)

## 2021-01-05 MED ORDER — LOPERAMIDE HCL 2 MG PO CAPS
2.0000 mg | ORAL_CAPSULE | ORAL | Status: DC | PRN
  Administered 2021-01-06 – 2021-01-08 (×3): 2 mg via ORAL
  Filled 2021-01-05 (×3): qty 1

## 2021-01-05 MED ORDER — MAGNESIUM SULFATE 2 GM/50ML IV SOLN
2.0000 g | Freq: Once | INTRAVENOUS | Status: AC
  Administered 2021-01-05: 2 g via INTRAVENOUS
  Filled 2021-01-05: qty 50

## 2021-01-05 MED ORDER — INSULIN ASPART 100 UNIT/ML IJ SOLN
0.0000 [IU] | Freq: Every day | INTRAMUSCULAR | Status: DC
  Administered 2021-01-06: 2 [IU] via SUBCUTANEOUS

## 2021-01-05 MED ORDER — INSULIN ASPART 100 UNIT/ML IJ SOLN
0.0000 [IU] | Freq: Three times a day (TID) | INTRAMUSCULAR | Status: DC
  Administered 2021-01-05 (×2): 3 [IU] via SUBCUTANEOUS
  Administered 2021-01-06: 2 [IU] via SUBCUTANEOUS
  Administered 2021-01-06 (×2): 3 [IU] via SUBCUTANEOUS
  Administered 2021-01-07: 5 [IU] via SUBCUTANEOUS
  Administered 2021-01-07 – 2021-01-08 (×3): 2 [IU] via SUBCUTANEOUS
  Administered 2021-01-08 – 2021-01-09 (×2): 3 [IU] via SUBCUTANEOUS
  Administered 2021-01-09: 2 [IU] via SUBCUTANEOUS
  Administered 2021-01-09 – 2021-01-10 (×2): 3 [IU] via SUBCUTANEOUS

## 2021-01-05 NOTE — Progress Notes (Signed)
Removed oxygen, while at rest O2 sat dropped to 85.  Put 2L of oxygen back on patient while at rest and O2 sat went up to 94%. Will continue to monitor.

## 2021-01-05 NOTE — Progress Notes (Signed)
[facility] PROGRESS NOTE    Helen [name]  QMG:[id number] DOB: Mar 21, 1930 DOA: 01/03/2021 PCP: Vernie Shanks, MD      Brief Narrative:  Helen [name] is a 87 y.o. F with dsCHF EF 25-30% in 2018, HTN, pAF on Eliquis, HTN, DM, hx stroke, PVD, BrCA s/p chemo and double mastectomy and hx hyponatremia who presented with cough, confusion.  Patient developed sore throat congestion and cough around 5/2.  She thought this was "a cold" because she had not been out of her apartment, but she progressively got worse, went to [facility] urgent care, tested positive for COVID on 5/8.  By 5/9 she felt severe fatigue and weakness, and some intermittent confusion so she went to the ER where her electrolytes were normal, oxygen level was normal, and appeared stable for discharge.  Now the last 5 days, she is gotten progressively weaker, more frequently confused, and her cough is persisted.  Here in the ER, chest imaging showed right base opacity.  24, white count 16 K, SpO2 88% on room air.  Started on empiric antibiotics and admitted to hospitalist service.         Assessment & Plan:  COVID-19 -Continue dexamethasone  -Flutter and I-S - PT eval, encourage mobility - Consult dietitian given age, poorer outcomes in elderly with undernutrition during COVID    Acute hypoxic respiratory failure Still desats off O2.  Not on O2 at home.  Acute metabolic encephalopathy and asthenia due to COVID Seems to be improving   Community acquired pneumonia RLL -Continue ceftriaxone and azithromycin  Hypertension Chronic systolic and diastolic CHF Peripheral vascular disease Does not appear fluid overloaded Blood pressure high normal - Continue atorvastatin, carvedilol, furosemide, Entresto    Paroxysmal atrial fibrillation Heart rate normal on exam - Continue Eliquis and carvedilol  Prolonged QTc EKG 500 and stable on azithromycin  Diabetes, type 2 without complication T2I  7.1%, AM glucose today 179 -Start SS  -Start linagliptin  GERD - Continue PPI  Hyponatremia Mild, asymptomatic  Hypomagnesemia Diarrhea with Mag-Ox. - Stop oral magnesium - Start IV magnesium  Anemia of chornic disease No clinical bleeding, hemoglobin slightly lower than baseline,   Depression -Hold home citalopram      Disposition: Status is: Inpatient  Remains inpatient appropriate beca use:87 year old female with acute hypoxic respiratory failure still with new oxygen requirement   Dispo: The patient is from: independent living              Anticipated d/c is to: TBD              Patient currently is not medically stable to d/c.   Difficult to place patient No       Level of care: Med-Surg       MDM: The below labs and imaging reports were reviewed and summarized above.  Medication management as above.    DVT prophylaxis:  apixaban (ELIQUIS) tablet 5 mg  Code Status: DNR Family Communication: Daughter by phone          Subjective: Still coughing, nonproductive.  No fever.  Confusion seems improved.  No vomiting, chest pain, abdominal pain, loss of consciousness.      Objective: Vitals:   01/05/21 0811 01/05/21 0849 01/05/21 0852 01/05/21 0856  BP: (!) 155/78     Pulse: 82     Resp: 17     Temp: 97.7 F (36.5 C)     TempSrc: Oral     SpO2: 100% 93% (!) 85%  94%  Weight:      Height:        Intake/Output Summary (Last 24 hours) at 01/05/2021 1140 Last data filed at 01/05/2021 0600 Gross per 24 hour  Intake 416.67 ml  Output 450 ml  Net -33.33 ml   Filed Weights   01/03/21 1704 01/05/21 0421  Weight: 65.8 kg 68.2 kg    Examination: General appearance: Elderly female, lying in bed, no acute distress, interactive HEENT: Anicteric, conjunctival pink, lids and lashes normal.  No nasal deformity, discharge, or epistaxis.  Oropharynx moist, no oral lesions, lips normal, dentition in good repair Skin:  Cardiac: RRR, no murmurs, no  lower extremity edema Respiratory: Rales at the right base, faint, respiratory effort normal, lungs otherwise clear, no wheezes Abdomen: Abdomen soft without tenderness palpation or guarding, no distention MSK:  Neuro: Awake and alert, extraocular movements intact, moves all extremities with normal strength and coordination,  Generalized weakness, face symmetric, speech fluent Psych: Attention normal, affect normal, oriented to person, place, time, and situation, judgment insight appear normal     Data Reviewed: I have personally reviewed following labs and imaging studies:  CBC: Recent Labs  Lab 12/30/20 2211 01/03/21 1933 01/04/21 0533 01/05/21 0249  WBC 17.4* 16.1* 12.6* 11.4*  NEUTROABS 14.6* 13.5*  --   --   HGB 10.4* 10.6* 8.2* 8.4*  HCT 31.7* 32.3* 25.1* 25.9*  MCV 90.8 91.2 91.3 91.8  PLT 161 201 147* 081   Basic Metabolic Panel: Recent Labs  Lab 12/30/20 2211 01/03/21 1933 01/03/21 2303 01/04/21 0533 01/05/21 0249  NA 134* 132*  --  133* 135  K 4.0 3.5  --  3.5 4.0  CL 97* 95*  --  97* 101  CO2 27 27  --  27 28  GLUCOSE 134* 129*  --  115* 179*  BUN 35* 25*  --  22 24*  CREATININE 1.59* 0.95  --  0.82 0.85  CALCIUM 8.9 8.9  --  8.1* 7.6*  MG  --   --  1.5*  --  1.4*   GFR: Estimated Creatinine Clearance: 40.6 mL/min (by C-G formula based on SCr of 0.85 mg/dL). Liver Function Tests: Recent Labs  Lab 12/30/20 2211 01/03/21 1933 01/04/21 0533 01/05/21 0249  AST 26 60* 39 31  ALT 18 70* 52* 51*  ALKPHOS 52 64 53 50  BILITOT 0.5 0.7 0.5 0.5  PROT 7.6 7.8 6.0* 5.4*  ALBUMIN 3.5 3.0* 2.3* 2.1*   No results for input(s): LIPASE, AMYLASE in the last 168 hours. No results for input(s): AMMONIA in the last 168 hours. Coagulation Profile: No results for input(s): INR, PROTIME in the last 168 hours. Cardiac Enzymes: No results for input(s): CKTOTAL, CKMB, CKMBINDEX, TROPONINI in the last 168 hours. BNP (last 3 results) No results for input(s): PROBNP in  the last 8760 hours. HbA1C: Recent Labs    01/05/21 0249  HGBA1C 6.8*   CBG: Recent Labs  Lab 01/03/21 2214 01/05/21 1121  GLUCAP 120* 199*   Lipid Profile: No results for input(s): CHOL, HDL, LDLCALC, TRIG, CHOLHDL, LDLDIRECT in the last 72 hours. Thyroid Function Tests: No results for input(s): TSH, T4TOTAL, FREET4, T3FREE, THYROIDAB in the last 72 hours. Anemia Panel: No results for input(s): VITAMINB12, FOLATE, FERRITIN, TIBC, IRON, RETICCTPCT in the last 72 hours. Urine analysis:    Component Value Date/Time   COLORURINE YELLOW 01/04/2021 1805   APPEARANCEUR CLEAR 01/04/2021 1805   APPEARANCEUR Clear 03/12/2017 1440   LABSPEC 1.016 01/04/2021 1805  PHURINE 5.0 01/04/2021 1805   GLUCOSEU NEGATIVE 01/04/2021 1805   HGBUR NEGATIVE 01/04/2021 1805   BILIRUBINUR NEGATIVE 01/04/2021 1805   BILIRUBINUR Negative 03/12/2017 1440   KETONESUR 5 (A) 01/04/2021 [place] 01/04/2021 1805   NITRITE NEGATIVE 01/04/2021 1805   LEUKOCYTESUR NEGATIVE 01/04/2021 1805   Sepsis Labs: _0 (procalcitonin:4,lacticacidven:4)  ) Recent Results (from the past 240 hour(s))  Culture, blood (single)     Status: None (Preliminary result)   Collection Time: 01/03/21  7:33 PM   Specimen: BLOOD  Result Value Ref Range Status   Specimen Description   Final    BLOOD RIGHT ANTECUBITAL Performed at [facility], [place] [address], [place], [place] [address]    Special Requests   Final    BOTTLES DRAWN AEROBIC AND ANAEROBIC Blood Culture adequate volume Performed at [place] [address] [place], [place] [address]    Culture   Final    NO GROWTH 1 DAY Performed at [facility], [place] [address], [place], [place] [address]    Report Status PENDING  Incomplete  Culture, blood (single)     Status: None (Preliminary result)   Collection Time: 01/03/21 10:20 PM   Specimen: BLOOD RIGHT FOREARM  Result Value Ref Range Status    Specimen Description   Final    BLOOD RIGHT FOREARM Performed at [facility], [place] [address], [place], [place] [address]    Special Requests   Final    BOTTLES DRAWN AEROBIC AND ANAEROBIC Blood Culture adequate volume Performed at [place] [address] [place], [place] [address]    Culture   Final    NO GROWTH 1 DAY Performed at [facility], [place] [address], [place], [place] [address]    Report Status PENDING  Incomplete  Resp Panel by RT-PCR (Flu A&B, Covid) Nasopharyngeal Swab     Status: Abnormal   Collection Time: 01/04/21  9:49 AM   Specimen: Nasopharyngeal Swab; Nasopharyngeal(NP) swabs in vial transport medium  Result Value Ref Range Status   SARS Coronavirus 2 by RT PCR POSITIVE (A) NEGATIVE Final    Comment: RESULT CALLED TO, READ BACK BY AND VERIFIED WITH: LITHICUM,E. RN AT 1212 01/04/21 MULLINS,T (NOTE) SARS-CoV-2 target nucleic acids are DETECTED.  The SARS-CoV-2 RNA is generally detectable in upper respiratory specimens during the acute phase of infection. Positive results are indicative of the presence of the identified virus, but do not rule out bacterial infection or co-infection with other pathogens not detected by the test. Clinical correlation with patient history and other diagnostic information is necessary to determine patient infection status. The expected result is Negative.  Fact Sheet for Patients: EntrepreneurPulse.com.au  Fact Sheet for Healthcare Providers: IncredibleEmployment.be  This test is not yet approved or cleared by the [place] FDA and  has been authorized for detection and/or diagnosis of SARS-CoV-2 by FDA under an Emergency Use Authorization (EUA).  This EUA will remain in effect (meaning this tes t can be used) for the duration of  the COVID-19 declaration under Section 564(b)(1) of the Act, 21 U.S.C. section 360bbb-3(b)(1), unless the authorization  is terminated or revoked sooner.     Influenza A by PCR NEGATIVE NEGATIVE Final   Influenza B by PCR NEGATIVE NEGATIVE Final    Comment: (NOTE) The Xpert Xpress SARS-CoV-2/FLU/RSV plus assay is intended as an aid in the diagnosis of influenza from Nasopharyngeal swab specimens and should not be used as  a sole basis for treatment. Nasal washings and aspirates are unacceptable for Xpert Xpress SARS-CoV-2/FLU/RSV testing.  Fact Sheet for Patients: EntrepreneurPulse.com.au  Fact Sheet for Healthcare Providers: IncredibleEmployment.be  This test is not yet approved or cleared by the [place] FDA and has been authorized for detection and/or diagnosis of SARS-CoV-2 by FDA under an Emergency Use Authorization (EUA). This EUA will remain in effect (meaning this test can be used) for the duration of the COVID-19 declaration under Section 564(b)(1) of the Act, 21 U.S.C. section 360bbb-3(b)(1), unless the authorization is terminated or revoked.  Performed at [facility], [place] [address] [place], [place] [address]          Radiology Studies: DG Chest 1 View  Result Date: 01/03/2021 CLINICAL DATA:  Shortness of breath.  Confusion. EXAM: CHEST  1 VIEW COMPARISON:  Dec 30, 2020 FINDINGS: There is airspace opacity in the right base. There is scarring with volume loss throughout portions of the right lung. The left lung is clear. Heart size and pulmonary vascularity are normal. No adenopathy. There is aortic atherosclerosis. No bone lesions. IMPRESSION: Airspace opacity right base, likely focus of pneumonia. Areas of scarring noted with volume loss in the right lung, stable. Left lung clear. Stable cardiac silhouette. Aortic Atherosclerosis (ICD10-I70.0). Electronically Signed   By: Lowella Grip III M.D.   On: 01/03/2021 17:53        Scheduled Meds: . apixaban  5 mg Oral BID  . atorvastatin  80 mg Oral QHS  . carvedilol   25 mg Oral BID WC  . citalopram  20 mg Oral Daily  . dexamethasone  6 mg Oral Daily  . furosemide  20 mg Oral Daily  . insulin aspart  0-15 Units Subcutaneous TID WC  . insulin aspart  0-5 Units Subcutaneous QHS  . linagliptin  5 mg Oral Daily  . pantoprazole  40 mg Oral BID AC  . sacubitril-valsartan  1 tablet Oral BID  . sodium chloride flush  3 mL Intravenous Q12H   Continuous Infusions: . azithromycin Stopped (01/05/21 0126)  . cefTRIAXone (ROCEPHIN)  IV Stopped (01/05/21 0027)  . magnesium sulfate bolus IVPB       LOS: 1 day    Time spent: 25 minutes    Edwin Dada, MD [facility] 01/05/2021, 11:40 AM     Please page though [place] or Epic secure chat:  For [employer], Adult nurse

## 2021-01-05 NOTE — Plan of Care (Signed)
  Problem: Respiratory: Goal: Complications related to the disease process, condition or treatment will be avoided or minimized Outcome: Progressing   Problem: Clinical Measurements: Goal: Respiratory complications will improve Outcome: Progressing   Problem: Nutrition: Goal: Adequate nutrition will be maintained Outcome: Progressing   Problem: Elimination: Goal: Will not experience complications related to bowel motility Outcome: Progressing   Problem: Elimination: Goal: Will not experience complications related to urinary retention Outcome: Progressing   Problem: Safety: Goal: Ability to remain free from injury will improve Outcome: Progressing

## 2021-01-06 LAB — GLUCOSE, CAPILLARY
Glucose-Capillary: 130 mg/dL — ABNORMAL HIGH (ref 70–99)
Glucose-Capillary: 141 mg/dL — ABNORMAL HIGH (ref 70–99)
Glucose-Capillary: 156 mg/dL — ABNORMAL HIGH (ref 70–99)
Glucose-Capillary: 183 mg/dL — ABNORMAL HIGH (ref 70–99)
Glucose-Capillary: 213 mg/dL — ABNORMAL HIGH (ref 70–99)

## 2021-01-06 LAB — MAGNESIUM: Magnesium: 1.9 mg/dL (ref 1.7–2.4)

## 2021-01-06 LAB — HEMOGLOBIN A1C
Hgb A1c MFr Bld: 6.8 % — ABNORMAL HIGH (ref 4.8–5.6)
Mean Plasma Glucose: 148.46 mg/dL

## 2021-01-06 MED ORDER — AZITHROMYCIN 250 MG PO TABS
500.0000 mg | ORAL_TABLET | Freq: Every day | ORAL | Status: AC
  Administered 2021-01-06 – 2021-01-07 (×2): 500 mg via ORAL
  Filled 2021-01-06 (×2): qty 2

## 2021-01-06 MED ORDER — ENSURE ENLIVE PO LIQD
237.0000 mL | Freq: Two times a day (BID) | ORAL | Status: DC
  Administered 2021-01-06 – 2021-01-09 (×8): 237 mL via ORAL

## 2021-01-06 MED ORDER — FUROSEMIDE 20 MG PO TABS
20.0000 mg | ORAL_TABLET | Freq: Every day | ORAL | Status: DC
  Administered 2021-01-07 – 2021-01-09 (×3): 20 mg via ORAL
  Filled 2021-01-06 (×3): qty 1

## 2021-01-06 MED ORDER — FUROSEMIDE 10 MG/ML IJ SOLN
40.0000 mg | Freq: Two times a day (BID) | INTRAMUSCULAR | Status: DC
  Filled 2021-01-06: qty 4

## 2021-01-06 MED ORDER — PROSOURCE PLUS PO LIQD
30.0000 mL | Freq: Every day | ORAL | Status: DC
  Administered 2021-01-06 – 2021-01-09 (×3): 30 mL via ORAL
  Filled 2021-01-06 (×3): qty 30

## 2021-01-06 NOTE — Progress Notes (Signed)
Attempted to wean patient to room air from 2L NS. Intially she did fine, had to lay her flat to clean her up and she immediately went down to 83% and was unable to recover. Restarted 2L of O2, sat her up for breakfast and then attempted to turn back down. She then had a coughing spell, again went down to 83% and was unable to recover again. Currently back on 2L of O2 via [place] at 95% spO2.

## 2021-01-06 NOTE — Evaluation (Signed)
Physical Therapy Evaluation Patient Details Name: Helen [name] MRN: [id number] DOB: 09-19-1929 Today's Date: 01/06/2021   History of Present Illness  87 yo female admitted with [place], AMS. Hx of CHF, CVA, DM, breast ca-s/p double mastectomy, aneima  Clinical Impression  On eval, pt was Supv-Min guard for mobility. She walked ~20 feet around the room with a RW. Mild lightheadedness reported during session. No LOB with RW use. O2 >90% on 1L during session; 86% on RA during ambulation (with seated rest and cues, O2 sat level recovered to 90%). Replaced [place] O2 end of session. Will plan to follow and progress activity as tolerated.     Follow Up Recommendations Home health PT    Equipment Recommendations  None recommended by PT    Recommendations for Other Services       Precautions / Restrictions Precautions Precaution Comments: monitor O2 Restrictions Weight Bearing Restrictions: No      Mobility  Bed Mobility Overal bed mobility: Modified Independent                  Transfers Overall transfer level: Needs assistance Equipment used: Rolling walker (2 wheeled) Transfers: Sit to/from Stand Sit to Stand: Supervision         General transfer comment: cues for safety, hand placement  Ambulation/Gait Ambulation/Gait assistance: Min guard Gait Distance (Feet): 20 Feet Assistive device: Rolling walker (2 wheeled) Gait Pattern/deviations: Step-through pattern     General Gait Details: walked around room with a RW. no lob. tolerated distance well. mild lightheadedness.  Stairs            Wheelchair Mobility    Modified Rankin (Stroke Patients Only)       Balance Overall balance assessment: Mild deficits observed, not formally tested                                           Pertinent Vitals/Pain Pain Assessment: No/denies pain    Home Living Family/patient expects to be discharged to:: Private residence Living  Arrangements: Alone   Type of Home: Independent living facility Home Access: Level entry     Home Layout: One level Home Equipment: Walker - 4 wheels      Prior Function Level of Independence: Independent with assistive device(s)         Comments: is normally ambulatory with a rollator for long distances. pt has been staying in her room more trying to avoid COVID     Hand Dominance        Extremity/Trunk Assessment   Upper Extremity Assessment Upper Extremity Assessment: Overall WFL for tasks assessed    Lower Extremity Assessment Lower Extremity Assessment: Generalized weakness    Cervical / Trunk Assessment Cervical / Trunk Assessment: Normal  Communication   Communication: No difficulties  Cognition Arousal/Alertness: Awake/alert Behavior During Therapy: WFL for tasks assessed/performed Overall Cognitive Status: Within Functional Limits for tasks assessed                                        General Comments      Exercises General Exercises - Lower Extremity Long Arc Quad: Both;15 reps;AROM   Assessment/Plan    PT Assessment Patient needs continued PT services  PT Problem List Decreased mobility;Decreased activity tolerance;Decreased balance;Decreased knowledge of use of DME  PT Treatment Interventions DME instruction;Gait training;Therapeutic exercise;Balance training;Functional mobility training;Therapeutic activities;Patient/family education    PT Goals (Current goals can be found in the Care Plan section)  Acute Rehab PT Goals Patient Stated Goal: regain plof. PT Goal Formulation: With patient/family Time For Goal Achievement: 01/20/21 Potential to Achieve Goals: Good    Frequency Min 3X/week   Barriers to discharge        Co-evaluation               AM-PAC PT "6 Clicks" Mobility  Outcome Measure Help needed turning from your back to your side while in a flat bed without using bedrails?: None Help needed  moving from lying on your back to sitting on the side of a flat bed without using bedrails?: None Help needed moving to and from a bed to a chair (including a wheelchair)?: A Little Help needed standing up from a chair using your arms (e.g., wheelchair or bedside chair)?: A Little Help needed to walk in hospital room?: A Little Help needed climbing 3-5 steps with a railing? : A Little 6 Click Score: 20    End of Session   Activity Tolerance: Patient tolerated treatment well Patient left: in chair;with call bell/phone within reach;with family/visitor present   PT Visit Diagnosis: Difficulty in walking, not elsewhere classified (R26.2)    Time: 5681-2751 PT Time Calculation (min) (ACUTE ONLY): 29 min   Charges:   PT Evaluation $PT Eval Moderate Complexity: 1 Mod PT Treatments $Gait Training: 8-22 mins          Doreatha Massed, PT Acute Rehabilitation  Office: [phone] Pager: [phone]

## 2021-01-06 NOTE — Progress Notes (Signed)
Initial Nutrition Assessment  DOCUMENTATION CODES:   Not applicable  INTERVENTION:  - will order Ensure Plus (due to Ensure Enlive shortage) BID, each supplement provides 350 kcal and 13 grams protein. - will order 30 ml Prosource Plus once/day, each supplement provides 100 kcal and 15 grams protein.    NUTRITION DIAGNOSIS:   Increased nutrient needs related to acute illness,catabolic illness ([id number] infection) as evidenced by estimated needs.  GOAL:   Patient will meet greater than or equal to 90% of their needs  MONITOR:   PO intake,Supplement acceptance,Labs,Weight trends  REASON FOR ASSESSMENT:   Consult Assessment of nutrition requirement/status  ASSESSMENT:   87 y.o. female with medical history of CHF, HTN, afib on Eliquis, HTN, DM, stroke, PVD, breast cancer s/p chemo and double mastectomy and hx hyponatremia. She presented to the ED with cough, confusion, and sore throat that started on 5/2. She tested positive for COVID-19 on 5/8 at [facility]. By 5/9 she felt severe fatigue and weakness.  Patient consumed 75% of lunch yesterday (316 kcal and 19 grams protein) and 75% of breakfast this AM (258 kcal and 4 grams protein).   Patient reports having a poor/decreased appetite for 1-2 weeks PTA but does not feel that weight loss occurred during that time. It is noted that she has been having intermittent periods of confusion over the past 2 weeks.   Weight yesterday was 150 lb and weight on 5/9 was 147 lb. Prior to this, the most recently documented weight was on 05/01/20 when she weighed 144 lb.   No information documented in the edema section of flow sheet.  Per notes: - [id number] positive - CAP in RLL    Labs reviewed; HgbA1c: 6.8%, CBGs: 130 and 141 mg/dl, BUN: 24 mg/dl, Ca: 7.6 mg/dl. Medications reviewed; 20 mg oral lasix/day, sliding scale novolog, 5 mg tradjenta/day, 40 mg oral protonix BID, 2 g IV Mg sulfate x1 run 5/15.    NUTRITION - FOCUSED  PHYSICAL EXAM:  unable to complete at this time  Diet Order:   Diet Order            Diet Heart Room service appropriate? Yes; Fluid consistency: Thin; Fluid restriction: 1800 mL Fluid  Diet effective now                 EDUCATION NEEDS:   No education needs have been identified at this time  Skin:  Skin Assessment: Reviewed RN Assessment  Last BM:  5/15 (type 6 x1)  Height:   Ht Readings from Last 1 Encounters:  01/05/21 5\' 2"  (1.575 m)    Weight:   Wt Readings from Last 1 Encounters:  01/05/21 68.2 kg     Estimated Nutritional Needs:  Kcal:  1650-1850 kcal Protein:  80-90 grams Fluid:  >/= 2 L/day       Jarome Matin, MS, RD, LDN, CNSC Inpatient Clinical Dietitian RD pager # available in [facility]  After hours/weekend pager # available in [facility]

## 2021-01-06 NOTE — Plan of Care (Signed)
  Problem: Education: Goal: Knowledge of risk factors and measures for prevention of condition will improve Outcome: Progressing   Problem: Coping: Goal: Psychosocial and spiritual needs will be supported Outcome: Progressing   Problem: Respiratory: Goal: Will maintain a patent airway Outcome: Progressing Goal: Complications related to the disease process, condition or treatment will be avoided or minimized Outcome: Progressing   Problem: Education: Goal: Ability to demonstrate management of disease process will improve Outcome: Progressing Goal: Ability to verbalize understanding of medication therapies will improve Outcome: Progressing Goal: Individualized Educational Video(s) Outcome: Progressing

## 2021-01-06 NOTE — Progress Notes (Signed)
[place] [facility] PROGRESS NOTE    Helen [name]  JJH:[id number] DOB: 26-Sep-1929 DOA: 01/03/2021 PCP: Vernie Shanks, MD      Brief Narrative:  Helen [name] is a 87 y.o. F with dsCHF EF 25-30% in 2018, HTN, pAF on Eliquis, HTN, DM, hx stroke, PVD, BrCA s/p chemo and double mastectomy and hx hyponatremia who presented with cough, confusion.  Patient developed sore throat congestion and cough around 5/2.  She thought this was "a cold" because she had not been out of her apartment, but she progressively got worse, went to [facility] urgent care, tested positive for COVID on 5/8.  By 5/9 she felt severe fatigue and weakness, and some intermittent confusion so she went to the ER where her electrolytes were normal, oxygen level was normal, and appeared stable for discharge.  Over the next 5 days, she got progressively weaker, more frequently confused, and her cough persisted so she came to the ER.  In the ER, chest imaging showed right base opacity.  Na 124, white count 16K, SpO2 88% on room air.  Started on empiric antibiotics and admitted to hospitalist service.         Assessment & Plan:  COVID-19 No improvement, still on O2 - Continue isolation for 21 days from May 2 - Continue dexamethasone - Continue Flutter and I-S - PT eval, encourage mobility - Consult dietitian   Acute hypoxic respiratory failure Still desaturates off O2, not on O2 at home (uses CPAP at night sometimes)  Acute metabolic encephalopathy and asthenia due to [place] acquired pneumonia RLL Blood cultures negative - Continue ceftriaxone and azithromycin day 4 of 5  Hypertension Chronic systolic and diastolic CHF Peripheral vascular disease Euvolemic, BP controlled mostly - Continue atorvastatin, Entresto, carvedilol, furosemide  Paroxysmal atrial fibrillation Rate controlled - Continue Eliquis and carvedilol  Prolonged QTc EKG 500 and stable on  azithromycin  Diabetes, type 2 without complication Y1E 5.6%  Glucoses controlled - Continue SS corrections - Continue linagliptin while inpatient per internal COVID treatment guidelines  GERD - Continue PPI  Hyponatremia Mild, asymptomatic  Hypomagnesemia Diarrhea with Mag-Ox.  Repleted and resolved.   Anemia of chornic disease No clinical bleeding, hemoglobin slightly lower than baseline,   Depression - Hold home citalopram until discharge           Disposition: Status is: Inpatient  Remains inpatient appropriate beca use:87 year old female with acute hypoxic respiratory failure still with persistent oxygen requirement   Dispo: The patient is from: independent living              Anticipated d/c is to: TBD              Patient currently is not medically stable to d/c.   Difficult to place patient No       Level of care: Med-Surg       MDM: The below labs and imaging reports were reviewed and summarized above.  Medication management as above.    DVT prophylaxis:  apixaban (ELIQUIS) tablet 5 mg  Code Status: DNR Family Communication: Daughter by phone          Subjective: Still having frequent stools, soft, per nursing.  Has a frequent cough productive of whitish nonpurulent sputum, no fever.  Confusion again seems to be improving in my opinion.  Generally fatigued.     Objective: Vitals:   01/05/21 0856 01/05/21 1738 01/05/21 2004 01/06/21 0500  BP:  (!) 157/67 (!) 130/50 139/71  Pulse:  85 67 71  Resp:  15 16 16   Temp:  98.3 F (36.8 C) 98 F (36.7 C) 98.8 F (37.1 C)  TempSrc:  Oral Oral Oral  SpO2: 94% 99% 99% 97%  Weight:      Height:        Intake/Output Summary (Last 24 hours) at 01/06/2021 1006 Last data filed at 01/06/2021 1000 Gross per 24 hour  Intake 1120.22 ml  Output 200 ml  Net 920.22 ml   Filed Weights   01/03/21 1704 01/05/21 0421  Weight: 65.8 kg 68.2 kg    Examination: General appearance:  Elderly female, sitting up in recliner, interactive, attentive.     HEENT: Oropharynx moist, no oral lesions, hearing diminished, anicteric, conjunctival, lids and lashes normal. Skin:  Cardiac: RRR, no murmurs, no lower extremity edema Respiratory: Normal respiratory rate and rhythm, crackles in the left base, diminished at the right base, otherwise no wheezing. Abdomen: Abdomen soft no tenderness palpation or guarding. MSK:  Neuro: Awake and alert, extraocular movements intact, moves all 4 extremities with generalized weakness but symmetrically, face symmetric, speech fluent Psych: Attentive, judgment and insight appear normal, affect pleasant.          Data Reviewed: I have personally reviewed following labs and imaging studies:  CBC: Recent Labs  Lab 12/30/20 2211 01/03/21 1933 01/04/21 0533 01/05/21 0249  WBC 17.4* 16.1* 12.6* 11.4*  NEUTROABS 14.6* 13.5*  --   --   HGB 10.4* 10.6* 8.2* 8.4*  HCT 31.7* 32.3* 25.1* 25.9*  MCV 90.8 91.2 91.3 91.8  PLT 161 201 147* 782   Basic Metabolic Panel: Recent Labs  Lab 12/30/20 2211 01/03/21 1933 01/03/21 2303 01/04/21 0533 01/05/21 0249 01/06/21 0408  NA 134* 132*  --  133* 135  --   K 4.0 3.5  --  3.5 4.0  --   CL 97* 95*  --  97* 101  --   CO2 27 27  --  27 28  --   GLUCOSE 134* 129*  --  115* 179*  --   BUN 35* 25*  --  22 24*  --   CREATININE 1.59* 0.95  --  0.82 0.85  --   CALCIUM 8.9 8.9  --  8.1* 7.6*  --   MG  --   --  1.5*  --  1.4* 1.9   GFR: Estimated Creatinine Clearance: 40.6 mL/min (by C-G formula based on SCr of 0.85 mg/dL). Liver Function Tests: Recent Labs  Lab 12/30/20 2211 01/03/21 1933 01/04/21 0533 01/05/21 0249  AST 26 60* 39 31  ALT 18 70* 52* 51*  ALKPHOS 52 64 53 50  BILITOT 0.5 0.7 0.5 0.5  PROT 7.6 7.8 6.0* 5.4*  ALBUMIN 3.5 3.0* 2.3* 2.1*   No results for input(s): LIPASE, AMYLASE in the last 168 hours. No results for input(s): AMMONIA in the last 168 hours. Coagulation  Profile: No results for input(s): INR, PROTIME in the last 168 hours. Cardiac Enzymes: No results for input(s): CKTOTAL, CKMB, CKMBINDEX, TROPONINI in the last 168 hours. BNP (last 3 results) No results for input(s): PROBNP in the last 8760 hours. HbA1C: Recent Labs    01/05/21 0249 01/06/21 0408  HGBA1C 6.8* 6.8*   CBG: Recent Labs  Lab 01/05/21 1643 01/05/21 1958 01/05/21 2346 01/06/21 0411 01/06/21 0744  GLUCAP 177* 162* 157* 130* 141*   Lipid Profile: No results for input(s): CHOL, HDL, LDLCALC, TRIG, CHOLHDL, LDLDIRECT in the last 72 hours. Thyroid Function Tests: No  results for input(s): TSH, T4TOTAL, FREET4, T3FREE, THYROIDAB in the last 72 hours. Anemia Panel: No results for input(s): VITAMINB12, FOLATE, FERRITIN, TIBC, IRON, RETICCTPCT in the last 72 hours. Urine analysis:    Component Value Date/Time   COLORURINE YELLOW 01/04/2021 1805   APPEARANCEUR CLEAR 01/04/2021 1805   APPEARANCEUR Clear 03/12/2017 1440   LABSPEC 1.016 01/04/2021 1805   PHURINE 5.0 01/04/2021 1805   GLUCOSEU NEGATIVE 01/04/2021 1805   HGBUR NEGATIVE 01/04/2021 1805   BILIRUBINUR NEGATIVE 01/04/2021 1805   BILIRUBINUR Negative 03/12/2017 1440   KETONESUR 5 (A) 01/04/2021 1805   PROTEINUR NEGATIVE 01/04/2021 1805   NITRITE NEGATIVE 01/04/2021 1805   LEUKOCYTESUR NEGATIVE 01/04/2021 1805   Sepsis Labs: @LABRCNTIP (procalcitonin:4,lacticacidven:4)  ) Recent Results (from the past 240 hour(s))  Culture, blood (single)     Status: None (Preliminary result)   Collection Time: 01/03/21  7:33 PM   Specimen: BLOOD  Result Value Ref Range Status   Specimen Description   Final    BLOOD RIGHT ANTECUBITAL Performed at [facility], [place] [address] [place], [place] [address]    Special Requests   Final    BOTTLES DRAWN AEROBIC AND ANAEROBIC Blood Culture adequate volume Performed at [place] [address] [place], [place] [address]    Culture    Final    NO GROWTH 1 DAY Performed at [facility], [place] [address], [place], [place] [address]    Report Status PENDING  Incomplete  Culture, blood (single)     Status: None (Preliminary result)   Collection Time: 01/03/21 10:20 PM   Specimen: BLOOD RIGHT FOREARM  Result Value Ref Range Status   Specimen Description   Final    BLOOD RIGHT FOREARM Performed at [facility], [place] [address] [place], [place] [address]    Special Requests   Final    BOTTLES DRAWN AEROBIC AND ANAEROBIC Blood Culture adequate volume Performed at [place] [address], [place], [place] [address]    Culture   Final    NO GROWTH 1 DAY Performed at [facility], [place] [address] [place], [place] [address]    Report Status PENDING  Incomplete  Resp Panel by RT-PCR (Flu A&B, Covid) Nasopharyngeal Swab     Status: Abnormal   Collection Time: 01/04/21  9:49 AM   Specimen: Nasopharyngeal Swab; Nasopharyngeal(NP) swabs in vial transport medium  Result Value Ref Range Status   SARS Coronavirus 2 by RT PCR POSITIVE (A) NEGATIVE Final    Comment: RESULT CALLED TO, READ BACK BY AND VERIFIED WITH: LITHICUM,E. RN AT 1212 01/04/21 MULLINS,T (NOTE) SARS-CoV-2 target nucleic acids are DETECTED.  The SARS-CoV-2 RNA is generally detectable in upper respiratory specimens during the acute phase of infection. Positive results are indicative of the presence of the identified virus, but do not rule out bacterial infection or co-infection with other pathogens not detected by the test. Clinical correlation with patient history and other diagnostic information is necessary to determine patient infection status. The expected result is Negative.  Fact Sheet for Patients: EntrepreneurPulse.com.au  Fact Sheet for Healthcare Providers: IncredibleEmployment.be  This test is not yet approved or cleared by the [place] FDA and  has been authorized  for detection and/or diagnosis of SARS-CoV-2 by FDA under an Emergency Use Authorization (EUA).  This EUA will remain in effect (meaning this tes t can be used) for the duration of  the COVID-19 declaration under Section 564(b)(1) of the  Act, 21 U.S.C. section 360bbb-3(b)(1), unless the authorization is terminated or revoked sooner.     Influenza A by PCR NEGATIVE NEGATIVE Final   Influenza B by PCR NEGATIVE NEGATIVE Final    Comment: (NOTE) The Xpert Xpress SARS-CoV-2/FLU/RSV plus assay is intended as an aid in the diagnosis of influenza from Nasopharyngeal swab specimens and should not be used as a sole basis for treatment. Nasal washings and aspirates are unacceptable for Xpert Xpress SARS-CoV-2/FLU/RSV testing.  Fact Sheet for Patients: EntrepreneurPulse.com.au  Fact Sheet for Healthcare Providers: IncredibleEmployment.be  This test is not yet approved or cleared by the [place] FDA and has been authorized for detection and/or diagnosis of SARS-CoV-2 by FDA under an Emergency Use Authorization (EUA). This EUA will remain in effect (meaning this test can be used) for the duration of the COVID-19 declaration under Section 564(b)(1) of the Act, 21 U.S.C. section 360bbb-3(b)(1), unless the authorization is terminated or revoked.  Performed at [facility], [place] [address], [place], [place] [address]          Radiology Studies: No results found.      Scheduled Meds: . apixaban  5 mg Oral BID  . atorvastatin  80 mg Oral QHS  . carvedilol  25 mg Oral BID WC  . dexamethasone  6 mg Oral Daily  . furosemide  20 mg Oral Daily  . insulin aspart  0-15 Units Subcutaneous TID WC  . insulin aspart  0-5 Units Subcutaneous QHS  . linagliptin  5 mg Oral Daily  . pantoprazole  40 mg Oral BID AC  . sacubitril-valsartan  1 tablet Oral BID  . sodium chloride flush  3 mL Intravenous Q12H   Continuous Infusions: .  azithromycin 500 mg (01/05/21 2358)  . cefTRIAXone (ROCEPHIN)  IV 2 g (01/05/21 2157)     LOS: 2 days    Time spent: 25 minutes    Edwin Dada, MD [facility] 01/06/2021, 10:06 AM     Please page though [place] or Epic secure chat:  For [employer], Adult nurse

## 2021-01-06 NOTE — TOC Progression Note (Signed)
Transition of Care [facility]) - Progression Note    Patient Details  Name: Helen [name] MRN: [id number] Date of Birth: [birth year]  Transition [facility]) CM/SW Contact  Purcell Mouton, RN Phone Number: 01/06/2021, 4:11 PM  Clinical Narrative:     Pt from [place] and plan to return. TOC will continue to follow for discharge needs.  Expected Discharge Plan: [place] Barriers to Discharge: No Barriers Identified  Expected Discharge Plan and Services Expected Discharge Plan: [place] arrangements for the past 2 months: [place]                                       Social Determinants of Health (SDOH) Interventions    Readmission Risk Interventions No flowsheet data found.

## 2021-01-07 LAB — COMPREHENSIVE METABOLIC PANEL
ALT: 36 U/L (ref 0–44)
AST: 18 U/L (ref 15–41)
Albumin: 2.6 g/dL — ABNORMAL LOW (ref 3.5–5.0)
Alkaline Phosphatase: 45 U/L (ref 38–126)
Anion gap: 7 (ref 5–15)
BUN: 21 mg/dL (ref 8–23)
CO2: 32 mmol/L (ref 22–32)
Calcium: 8.4 mg/dL — ABNORMAL LOW (ref 8.9–10.3)
Chloride: 98 mmol/L (ref 98–111)
Creatinine, Ser: 0.82 mg/dL (ref 0.44–1.00)
GFR, Estimated: >60 mL/min (ref >60)
Glucose, Bld: 131 mg/dL — ABNORMAL HIGH (ref 70–99)
Potassium: 3.5 mmol/L (ref 3.5–5.1)
Sodium: 137 mmol/L (ref 135–145)
Total Bilirubin: 0.4 mg/dL (ref 0.3–1.2)
Total Protein: 6.1 g/dL — ABNORMAL LOW (ref 6.5–8.1)

## 2021-01-07 LAB — GLUCOSE, CAPILLARY
Glucose-Capillary: 124 mg/dL — ABNORMAL HIGH (ref 70–99)
Glucose-Capillary: 124 mg/dL — ABNORMAL HIGH (ref 70–99)
Glucose-Capillary: 208 mg/dL — ABNORMAL HIGH (ref 70–99)
Glucose-Capillary: 200 mg/dL — ABNORMAL HIGH (ref 70–99)

## 2021-01-07 LAB — CBC
HCT: 28.3 % — ABNORMAL LOW (ref 36.0–46.0)
Hemoglobin: 9.1 g/dL — ABNORMAL LOW (ref 12.0–15.0)
MCH: 29.6 pg (ref 26.0–34.0)
MCHC: 32.2 g/dL (ref 30.0–36.0)
MCV: 92.2 fL (ref 80.0–100.0)
Platelets: 193 10*3/uL (ref 150–400)
RBC: 3.07 MIL/uL — ABNORMAL LOW (ref 3.87–5.11)
RDW: 16.8 % — ABNORMAL HIGH (ref 11.5–15.5)
WBC: 15.3 10*3/uL — ABNORMAL HIGH (ref 4.0–10.5)
nRBC: 0 % (ref 0.0–0.2)

## 2021-01-07 LAB — IRON AND TIBC
Iron: 41 ug/dL (ref 28–170)
Saturation Ratios: 19 % (ref 10.4–31.8)
TIBC: 215 ug/dL — ABNORMAL LOW (ref 250–450)
UIBC: 174 ug/dL

## 2021-01-07 LAB — FERRITIN: Ferritin: 366 ng/mL — ABNORMAL HIGH (ref 11–307)

## 2021-01-07 LAB — C-REACTIVE PROTEIN: CRP: 2.3 mg/dL — ABNORMAL HIGH (ref <1.0)

## 2021-01-07 MED ORDER — LOPERAMIDE HCL 2 MG PO CAPS
4.0000 mg | ORAL_CAPSULE | Freq: Once | ORAL | Status: AC
  Administered 2021-01-07: 4 mg via ORAL
  Filled 2021-01-07: qty 2

## 2021-01-07 NOTE — Progress Notes (Signed)
Physical Therapy Treatment Patient Details Name: Helen [name] MRN: [id number] DOB: 01/02/1930 Today's Date: 01/07/2021    History of Present Illness 87 yo female admitted with [place], [place]. Hx of CHF, CVA, DM, breast ca-s/p double mastectomy, aneima    PT Comments    Patient making good progress with acute PT, able to advance gait to  2 bouts of ~30' with RW with min guard/supervision. Pt continues to desat on RA with gait to low of 85% and recovered to 90% with 1 minute seated rest and pursed lip breathing. Patient completed IS and flutter valve exercises to improve breathing and facilitate increased SpO2. EOS pt O2 decreased to 1L/min with sats at 94%. Acute PT will continue to progress pt as able.    Follow Up Recommendations  Home health PT     Equipment Recommendations  None recommended by PT    Recommendations for Other Services       Precautions / Restrictions Precautions Precaution Comments: monitor O2 Restrictions Weight Bearing Restrictions: No    Mobility  Bed Mobility               General bed mobility comments: pt OOB in recliner    Transfers Overall transfer level: Needs assistance Equipment used: Rolling walker (2 wheeled) Transfers: Sit to/from Stand Sit to Stand: Supervision         General transfer comment: pt with good recall for hand placement for power up, cues for safe reach back to sit.  Ambulation/Gait Ambulation/Gait assistance: Min guard;Supervision Gait Distance (Feet): 60 Feet (2x30) Assistive device: Rolling walker (2 wheeled) Gait Pattern/deviations: Step-through pattern;Decreased stride length Gait velocity: decr   General Gait Details: no overt LOB noted, pt maintained safe proximity and walker management. amb two loops in room for ~30' with min guard/sup for safety on RA. Desat to low of 85% and recovered to ~90% with 1 mnute rest and pursed lip breathing. pt completed 2 bouts with seated rest and breathign exs  between.   Stairs             Wheelchair Mobility    Modified Rankin (Stroke Patients Only)       Balance Overall balance assessment: Mild deficits observed, not formally tested                                          Cognition Arousal/Alertness: Awake/alert Behavior During Therapy: WFL for tasks assessed/performed Overall Cognitive Status: Within Functional Limits for tasks assessed                                        Exercises Other Exercises Other Exercises: 2x 5 reps flutter valve on level 2. 10 reps IS with meeter reaching just above 540mL.    General Comments        Pertinent Vitals/Pain      Home Living                      Prior Function            PT Goals (current goals can now be found in the care plan section) Acute Rehab PT Goals Patient Stated Goal: regain plof. PT Goal Formulation: With patient/family Time For Goal Achievement: 01/20/21 Potential to Achieve Goals: Good Progress towards PT goals:  Progressing toward goals    Frequency    Min 3X/week      PT Plan Current plan remains appropriate    Co-evaluation              AM-PAC PT "6 Clicks" Mobility   Outcome Measure  Help needed turning from your back to your side while in a flat bed without using bedrails?: None Help needed moving from lying on your back to sitting on the side of a flat bed without using bedrails?: None Help needed moving to and from a bed to a chair (including a wheelchair)?: A Little Help needed standing up from a chair using your arms (e.g., wheelchair or bedside chair)?: A Little Help needed to walk in hospital room?: A Little Help needed climbing 3-5 steps with a railing? : A Little 6 Click Score: 20    End of Session   Activity Tolerance: Patient tolerated treatment well Patient left: in chair;with call bell/phone within reach;with family/visitor present Nurse Communication: Mobility  status PT Visit Diagnosis: Difficulty in walking, not elsewhere classified (R26.2)     Time: 1916-6060 PT Time Calculation (min) (ACUTE ONLY): 23 min  Charges:  $Gait Training: 8-22 mins $Therapeutic Exercise: 8-22 mins                     Verner Mould, DPT Acute Rehabilitation Services Office [phone] Pager [phone]     Jacques Navy 01/07/2021, 4:39 PM

## 2021-01-07 NOTE — Progress Notes (Signed)
SATURATION QUALIFICATIONS:   Patient Saturations on Room Air at Rest = 86-88 %  Patient Saturations on Room Air while Ambulating = 88 %  Patient Saturations on 2  Liters of oxygen while Ambulating = 94-96%  Please briefly explain why patient needs home oxygen: Patient oxygen saturation decreased at rest while sitting in chair on room air. Pt stood up and took one step with sat decreasing 86 %. 2 liters applied and sat increased 94-96 %. Pt visibly appeared sob, will continue to monitor.

## 2021-01-07 NOTE — Care Management Important Message (Signed)
Important Message  Patient Details IM Letter given to the Patient. Name: Helen [name] MRN: [id number] Date of Birth: 11/25/1929   Medicare Important Message Given:  Yes     Kerin Salen 01/07/2021, 8:51 AM

## 2021-01-07 NOTE — Progress Notes (Signed)
[place] [facility] PROGRESS NOTE    Helen [name]  QIW:[id number] DOB: 1930/06/01 DOA: 01/03/2021 PCP: Vernie Shanks, MD      Brief Narrative:  Helen [name] is a 87 y.o. F with dsCHF EF 25-30% in 2018, HTN, pAF on Eliquis, HTN, DM, hx stroke, PVD, BrCA s/p chemo and double mastectomy and hx hyponatremia who presented with cough, confusion.  Patient developed sore throat congestion and cough around 5/2.  She thought this was "a cold" because she had not been out of her apartment, but she progressively got worse, went to [facility] urgent care, tested positive for COVID on 5/8.  By 5/9 she felt severe fatigue and weakness, and some intermittent confusion so she went to the ER where her electrolytes were normal, oxygen level was normal, and appeared stable for discharge.  Over the next 5 days, she got progressively weaker, more frequently confused, and her cough persisted so she came to the ER.  In the ER, chest imaging showed right base opacity.  Na 124, white count 16K, SpO2 88% on room air.  Started on empiric antibiotics and admitted to hospitalist service.         Assessment & Plan:  COVID-19 She has typical COVID (presenting with cough, chest x-ray opacities on day 7-10, fatigue, diarrhea, cough).    - Continue dexamethasone    - Continue isolation for 21 days from May 2 - Continue dexamethasone - Continue Flutter and I-S - PT eval, encourage mobility - Consult dietitian    Community acquired pneumonia RLL - Continue ceftriaxone day 5 of 7 - Continue azithromycin day 5 of 5 - Wean O2 as able   Diarrhea Patient started to have frequent uncontrollable diarrhea, greater than 5 times overnight, incontinent.  Nonbloody.  Suspect antibiotic associated diarrhea, possibly with COVID contributing. - Increased utilization of Imodium    Anemia of chronic disease Hgb baseline probably 10-11.  Here has been 8-10, today 9.    Has hx GIB in Apr 2021, at that time  upper and lower endoscopy showed no active bleed, some gastric and colon polyps, and she was tranfused once and put back on her Eliquis.  No clinical bleeding.    - Follow up iron stores     Acute on chronic hypoxic respiratory failure At abseline, does not use O2 during day.  Has CHF and uses O2 at night.  Here, still desaturates off O2.    - Wean O2 as able    Hypertension Chronic systolic and diastolic CHF Peripheral vascular disease BNP elevated but BP soft, limiting diuresis.  Appears euvolemic. No orthopnea, no swelling.  BP controlled  - Continue Entresto, Carvedilol - Continue furosemide - Continue atorvastatin    Paroxysmal atrial fibrillation Rate controlled - Continue Eliquis - Continue carvedilol  Prolonged QTc EKG 500 and stable on azithromycin  Diabetes, type 2 without complication E1D 4.0%  Glucoses well controlled - Continue sliding scale corrections - Continue linagliptin for COVID and diabetes, not for discharge  GERD - Continue PPI  Hyponatremia Mild, asymptomatic  Hypomagnesemia Resolved   Depression - Hold home citalopram until discharge given confusion    Acute metabolic encephalopathy and asthenia due to COVID Resolved  At baseline, patient has good memory.  On admission, she was at times confused and talking about people who werent' there.  This was encephalopathy from [place].           Disposition: Status is: Inpatient  Remains inpatient appropriate beca use:87 year old female with acute  hypoxic respiratory failure still with persistent oxygen requirement   Dispo: The patient is from: independent living              Anticipated d/c is to: TBD              Patient currently is not medically stable to d/c.   Difficult to place patient No       Level of care: Med-Surg       MDM: The below labs and imaging reports were reviewed and summarized above.  Medication management as above.    DVT prophylaxis:   apixaban (ELIQUIS) tablet 5 mg  Code Status: DNR Family Communication: Daughter by phone          Subjective: No fever.  Cough stable, no change in sputum, better.  Confusion seems mild to me this morning, was up a lot last night with diarrhea.        Objective: Vitals:   01/06/21 1700 01/06/21 2052 01/07/21 0613 01/07/21 1230  BP: 98/76 (!) 147/54 (!) 158/70 98/84  Pulse: 74 91 83 70  Resp:  (!) 22 20 20   Temp:  98.3 F (36.8 C) 98.1 F (36.7 C) 98 F (36.7 C)  TempSrc:  Oral  Oral  SpO2:  94% 96% 96%  Weight:      Height:       No intake or output data in the 24 hours ending 01/07/21 1257 Filed Weights   01/03/21 1704 01/05/21 0421  Weight: 65.8 kg 68.2 kg    Examination: General appearance: Elderly female, lying in bed, interactive     HEENT: Anicteric, conjunctival pink, lids and lashes normal.  No nasal deformity, discharge, or epistaxis. Skin:  Cardiac: RRR, no murmurs, no lower extremity edema Respiratory: Normal respiratory rate and rhythm, I do not appreciate crackles today Abdomen: Abdomen soft without tenderness palpation or guarding MSK:  Neuro: Awake and alert, moves all extremities with generalized weakness but symmetric, face symmetric, speech normal Psych: Oriented to person, place, and situation, judgment and insight appear normal, perhaps a little bit scattered and inattentive, but mostly normal           Data Reviewed: I have personally reviewed following labs and imaging studies:  CBC: Recent Labs  Lab 01/03/21 1933 01/04/21 0533 01/05/21 0249 01/07/21 0403  WBC 16.1* 12.6* 11.4* 15.3*  NEUTROABS 13.5*  --   --   --   HGB 10.6* 8.2* 8.4* 9.1*  HCT 32.3* 25.1* 25.9* 28.3*  MCV 91.2 91.3 91.8 92.2  PLT 201 147* 164 286   Basic Metabolic Panel: Recent Labs  Lab 01/03/21 1933 01/03/21 2303 01/04/21 0533 01/05/21 0249 01/06/21 0408 01/07/21 0403  NA 132*  --  133* 135  --  137  K 3.5  --  3.5 4.0  --  3.5  CL 95*   --  97* 101  --  98  CO2 27  --  27 28  --  32  GLUCOSE 129*  --  115* 179*  --  131*  BUN 25*  --  22 24*  --  21  CREATININE 0.95  --  0.82 0.85  --  0.82  CALCIUM 8.9  --  8.1* 7.6*  --  8.4*  MG  --  1.5*  --  1.4* 1.9  --    GFR: Estimated Creatinine Clearance: 42.1 mL/min (by C-G formula based on SCr of 0.82 mg/dL). Liver Function Tests: Recent Labs  Lab 01/03/21 1933 01/04/21 0533 01/05/21 0249  01/07/21 0403  AST 60* 39 31 18  ALT 70* 52* 51* 36  ALKPHOS 64 53 50 45  BILITOT 0.7 0.5 0.5 0.4  PROT 7.8 6.0* 5.4* 6.1*  ALBUMIN 3.0* 2.3* 2.1* 2.6*   No results for input(s): LIPASE, AMYLASE in the last 168 hours. No results for input(s): AMMONIA in the last 168 hours. Coagulation Profile: No results for input(s): INR, PROTIME in the last 168 hours. Cardiac Enzymes: No results for input(s): CKTOTAL, CKMB, CKMBINDEX, TROPONINI in the last 168 hours. BNP (last 3 results) No results for input(s): PROBNP in the last 8760 hours. HbA1C: Recent Labs    01/05/21 0249 01/06/21 0408  HGBA1C 6.8* 6.8*   CBG: Recent Labs  Lab 01/06/21 1212 01/06/21 1730 01/06/21 2017 01/07/21 0740 01/07/21 1057  GLUCAP 156* 183* 213* 124* 124*   Lipid Profile: No results for input(s): CHOL, HDL, LDLCALC, TRIG, CHOLHDL, LDLDIRECT in the last 72 hours. Thyroid Function Tests: No results for input(s): TSH, T4TOTAL, FREET4, T3FREE, THYROIDAB in the last 72 hours. Anemia Panel: No results for input(s): VITAMINB12, FOLATE, FERRITIN, TIBC, IRON, RETICCTPCT in the last 72 hours. Urine analysis:    Component Value Date/Time   COLORURINE YELLOW 01/04/2021 1805   APPEARANCEUR CLEAR 01/04/2021 1805   APPEARANCEUR Clear 03/12/2017 1440   LABSPEC 1.016 01/04/2021 1805   PHURINE 5.0 01/04/2021 1805   GLUCOSEU NEGATIVE 01/04/2021 1805   HGBUR NEGATIVE 01/04/2021 1805   BILIRUBINUR NEGATIVE 01/04/2021 1805   BILIRUBINUR Negative 03/12/2017 1440   KETONESUR 5 (A) 01/04/2021 1805   PROTEINUR  NEGATIVE 01/04/2021 1805   NITRITE NEGATIVE 01/04/2021 1805   LEUKOCYTESUR NEGATIVE 01/04/2021 1805   Sepsis Labs: @LABRCNTIP (procalcitonin:4,lacticacidven:4)  ) Recent Results (from the past 240 hour(s))  Culture, blood (single)     Status: None (Preliminary result)   Collection Time: 01/03/21  7:33 PM   Specimen: BLOOD  Result Value Ref Range Status   Specimen Description   Final    BLOOD RIGHT ANTECUBITAL Performed at [facility], [place] [address] [place], [place] [address]    Special Requests   Final    BOTTLES DRAWN AEROBIC AND ANAEROBIC Blood Culture adequate volume Performed at [place] [address] [place], [place] [address]    Culture   Final    NO GROWTH 3 DAYS Performed at [facility], [place] [address] [place], [place] [address]    Report Status PENDING  Incomplete  Culture, blood (single)     Status: None (Preliminary result)   Collection Time: 01/03/21 10:20 PM   Specimen: BLOOD RIGHT FOREARM  Result Value Ref Range Status   Specimen Description   Final    BLOOD RIGHT FOREARM Performed at [facility], [place] [address] [place], [place] [address]    Special Requests   Final    BOTTLES DRAWN AEROBIC AND ANAEROBIC Blood Culture adequate volume Performed at [place] [address], [place], [place] [address]    Culture   Final    NO GROWTH 3 DAYS Performed at [facility], [place] [address], [place], [place] [address]    Report Status PENDING  Incomplete  Resp Panel by RT-PCR (Flu A&B, Covid) Nasopharyngeal Swab     Status: Abnormal   Collection Time: 01/04/21  9:49 AM   Specimen: Nasopharyngeal Swab; Nasopharyngeal(NP) swabs in vial transport medium  Result Value Ref Range Status   SARS Coronavirus 2 by RT PCR POSITIVE (A) NEGATIVE Final  Comment: RESULT CALLED TO, READ BACK BY AND VERIFIED WITH: LITHICUM,E. RN AT 1212 01/04/21 MULLINS,T (NOTE) SARS-CoV-2 target nucleic  acids are DETECTED.  The SARS-CoV-2 RNA is generally detectable in upper respiratory specimens during the acute phase of infection. Positive results are indicative of the presence of the identified virus, but do not rule out bacterial infection or co-infection with other pathogens not detected by the test. Clinical correlation with patient history and other diagnostic information is necessary to determine patient infection status. The expected result is Negative.  Fact Sheet for Patients: EntrepreneurPulse.com.au  Fact Sheet for Healthcare Providers: IncredibleEmployment.be  This test is not yet approved or cleared by the [place] FDA and  has been authorized for detection and/or diagnosis of SARS-CoV-2 by FDA under an Emergency Use Authorization (EUA).  This EUA will remain in effect (meaning this tes t can be used) for the duration of  the COVID-19 declaration under Section 564(b)(1) of the Act, 21 U.S.C. section 360bbb-3(b)(1), unless the authorization is terminated or revoked sooner.     Influenza A by PCR NEGATIVE NEGATIVE Final   Influenza B by PCR NEGATIVE NEGATIVE Final    Comment: (NOTE) The Xpert Xpress SARS-CoV-2/FLU/RSV plus assay is intended as an aid in the diagnosis of influenza from Nasopharyngeal swab specimens and should not be used as a sole basis for treatment. Nasal washings and aspirates are unacceptable for Xpert Xpress SARS-CoV-2/FLU/RSV testing.  Fact Sheet for Patients: EntrepreneurPulse.com.au  Fact Sheet for Healthcare Providers: IncredibleEmployment.be  This test is not yet approved or cleared by the [place] FDA and has been authorized for detection and/or diagnosis of SARS-CoV-2 by FDA under an Emergency Use Authorization (EUA). This EUA will remain in effect (meaning this test can be used) for the duration of the COVID-19 declaration under Section 564(b)(1) of  the Act, 21 U.S.C. section 360bbb-3(b)(1), unless the authorization is terminated or revoked.  Performed at [facility], [place] [address] [place], [place] [address]          Radiology Studies: No results found.      Scheduled Meds: . (feeding supplement) PROSource Plus  30 mL Oral Daily  . apixaban  5 mg Oral BID  . atorvastatin  80 mg Oral QHS  . azithromycin  500 mg Oral QHS  . carvedilol  25 mg Oral BID WC  . dexamethasone  6 mg Oral Daily  . feeding supplement  237 mL Oral BID BM  . furosemide  20 mg Oral Daily  . insulin aspart  0-15 Units Subcutaneous TID WC  . insulin aspart  0-5 Units Subcutaneous QHS  . linagliptin  5 mg Oral Daily  . pantoprazole  40 mg Oral BID AC  . sacubitril-valsartan  1 tablet Oral BID  . sodium chloride flush  3 mL Intravenous Q12H   Continuous Infusions: . cefTRIAXone (ROCEPHIN)  IV 2 g (01/06/21 2153)     LOS: 3 days    Time spent: 25 minutes    Edwin Dada, MD [facility] 01/07/2021, 12:57 PM     Please page though [place] or Epic secure chat:  For [employer], Adult nurse

## 2021-01-07 NOTE — Plan of Care (Signed)
  Problem: Education: Goal: Knowledge of risk factors and measures for prevention of condition will improve Outcome: Progressing   Problem: Coping: Goal: Psychosocial and spiritual needs will be supported Outcome: Progressing   Problem: Respiratory: Goal: Will maintain a patent airway Outcome: Progressing Goal: Complications related to the disease process, condition or treatment will be avoided or minimized Outcome: Progressing   Problem: Education: Goal: Ability to demonstrate management of disease process will improve Outcome: Progressing Goal: Ability to verbalize understanding of medication therapies will improve Outcome: Progressing Goal: Individualized Educational Video(s) Outcome: Progressing   

## 2021-01-08 LAB — GLUCOSE, CAPILLARY
Glucose-Capillary: 122 mg/dL — ABNORMAL HIGH (ref 70–99)
Glucose-Capillary: 123 mg/dL — ABNORMAL HIGH (ref 70–99)
Glucose-Capillary: 187 mg/dL — ABNORMAL HIGH (ref 70–99)
Glucose-Capillary: 197 mg/dL — ABNORMAL HIGH (ref 70–99)

## 2021-01-08 MED ORDER — LOPERAMIDE HCL 2 MG PO CAPS
2.0000 mg | ORAL_CAPSULE | Freq: Four times a day (QID) | ORAL | Status: DC
  Administered 2021-01-08 – 2021-01-09 (×3): 2 mg via ORAL
  Filled 2021-01-08 (×4): qty 1

## 2021-01-08 NOTE — TOC Progression Note (Signed)
Transition of Care [facility]) - Progression Note    Patient Details  Name: Yavonne Kiss MRN: [id number] Date of Birth: 01/15/1930  [facility] CM/SW Contact  Purcell Mouton, RN Phone Number: 01/08/2021, 2:50 PM  Clinical Narrative:     Spoke with pt's daughter at bedside. Pt will discharge with [facility] for HHPT and pt will hire PCS.   Expected Discharge Plan: [place] Barriers to Discharge: No Barriers Identified  Expected Discharge Plan and Services Expected Discharge Plan: [place] arrangements for the past 2 months: [place]                                       Social Determinants of Health (SDOH) Interventions    Readmission Risk Interventions No flowsheet data found.

## 2021-01-08 NOTE — Progress Notes (Addendum)
PROGRESS NOTE  Helen [name] BTD:[id number] DOB: February 09, 1930 DOA: 01/03/2021 PCP: Vernie Shanks, MD  HPI/Recap of past 24 hours: Helen [name] is a 87 y.o. F with dsCHF EF 25-30% in 2018, HTN, pAF on Eliquis, HTN, DM, hx stroke, PVD, BrCA s/p chemo and double mastectomy and hx hyponatremia who presented with cough, confusion.  Patient developed sore throat congestion and cough around 5/2.  She thought this was "a cold" because she had not been out of her apartment, but she progressively got worse, went to [facility] urgent care, tested positive for COVID on 5/8.  By 5/9 she felt severe fatigue and weakness, and some intermittent confusion so she went to the ER where her electrolytes were normal, oxygen level was normal, and appeared stable for discharge.  Over the next 5 days, she got progressively weaker, more frequently confused, and her cough persisted so she came to the ER.  In the ER, chest imaging showed right base opacity.  Na 124, white count 16K, SpO2 88% on room air.  Started on empiric antibiotics and admitted to hospitalist service.  01/08/21:  Seen andf examined with her daughter at her bedside.  Reports persistent diarrhea and generalized weakness.  Received imodium this am.  Encouraged to increase oral intake to avoid dehydration.   Assessment/Plan: Principal Problem:   CAP (community acquired pneumonia) Active Problems:   Carotid stenosis   Hypercholesteremia   Diabetes mellitus without complication (HCC)   Peripheral vascular disease (HCC)   Acute encephalopathy   Hyponatremia   Essential hypertension   Anemia of chronic disease   Chronic combined systolic and diastolic heart failure (HCC)   Shortness of breath   COVID-19  COVID-19 viral infection Found to have typical Signs and Symptoms of COVID-19 symptoms, nonproductive cough, opacities on chest x-ray in the 17, fatigue, diarrhea. Continue Decadron Continue isolation for 21-day from May 2, in the low  vacillation 01/13/2021. Continue flutter valve and incentive spirometer Mobilize as tolerated  COVID-19 viral gastroenteritis Persistent diarrhea Increase p.o. fluid intake to avoid dehydration Imodium as needed  Community-acquired pneumonia, right lower lobe Complete 7 days of IV Rocephin, will be completed on 01/09/2021 Completed 5 days of azithromycin. Continue pulmonary toilet  Acute on chronic hypoxic respiratory failure She is on 2 L oxygen nasal cannula at nighttime Continue to wean off oxygen supplementation during the day. Currently requiring 2 L continuously. Home oxygen evaluation prior to discharge.  Type 2 diabetes with hyperglycemia likely exacerbated by steroids Last A1c 6.8%. Continue insulin sliding care, linagliptin  HFREF 25-30% Continue home cardiac medications Currently not on diuretics to avoid dehydration in the setting of persistent diarrhea Currently not on IV fluid to avoid volume overload Closely monitor volume status. Strict I's and O's and daily weight  Paroxysmal A. fib Continue Eliquis for secondary CVA prevention. Continue home Coreg  Resolving acute metabolic encephalopathy likely secondary to COVID-19 viral infection. Mentation improved per her daughter at bedside. Reorient as needed.  GERD Resume home PPI  Hyperlipidemia Continue home statin   Code Status: DNR  Family Communication: Daughter at bedside  Disposition Plan: Likely will dc back to independent living facility.   Consultants:  None  Procedures:  None  Antimicrobials:  Rocephin  Azithromycin  DVT prophylaxis:  Eliquis  Status is: Inpatient   Dispo: The patient is from: Independent living facility              Anticipated d/c is to: Independent living facility  Patient currently not stable for dc   Difficult to place patient, no applicable        Objective: Vitals:   01/07/21 0613 01/07/21 1230 01/07/21 2026 01/08/21 0517  BP:  (!) 158/70 98/84 (!) 152/64 (!) 164/65  Pulse: 83 70 87 69  Resp: _0 Temp: 98.1 F (36.7 C) 98 F (36.7 C) 98.6 F (37 C) 97.7 F (36.5 C)  TempSrc:  Oral Oral Oral  SpO2: 96% 96% 92% 97%  Weight:      Height:        Intake/Output Summary (Last 24 hours) at 01/08/2021 1126 Last data filed at 01/07/2021 1300 Gross per 24 hour  Intake 240 ml  Output --  Net 240 ml   Filed Weights   01/03/21 1704 01/05/21 0421  Weight: 65.8 kg 68.2 kg    Exam:  . General: 87 y.o. year-old female well developed well nourished in no acute distress.  Alert and oriented x3. . Cardiovascular: Regular rate and rhythm with no rubs or gallops.  No thyromegaly or JVD noted.   [name] Respiratory: Clear to auscultation with no wheezes or rales. Good inspiratory effort. . Abdomen: Soft nontender nondistended with normal bowel sounds x4 quadrants. . Musculoskeletal: No lower extremity edema. 2/4 pulses in all 4 extremities. . Skin: No ulcerative lesions noted or rashes, . Psychiatry: Mood is appropriate for condition and setting   Data Reviewed: CBC: Recent Labs  Lab 01/03/21 1933 01/04/21 0533 01/05/21 0249 01/07/21 0403  WBC 16.1* 12.6* 11.4* 15.3*  NEUTROABS 13.5*  --   --   --   HGB 10.6* 8.2* 8.4* 9.1*  HCT 32.3* 25.1* 25.9* 28.3*  MCV 91.2 91.3 91.8 92.2  PLT 201 147* 164 655   Basic Metabolic Panel: Recent Labs  Lab 01/03/21 1933 01/03/21 2303 01/04/21 0533 01/05/21 0249 01/06/21 0408 01/07/21 0403  NA 132*  --  133* 135  --  137  K 3.5  --  3.5 4.0  --  3.5  CL 95*  --  97* 101  --  98  CO2 27  --  27 28  --  32  GLUCOSE 129*  --  115* 179*  --  131*  BUN 25*  --  22 24*  --  21  CREATININE 0.95  --  0.82 0.85  --  0.82  CALCIUM 8.9  --  8.1* 7.6*  --  8.4*  MG  --  1.5*  --  1.4* 1.9  --    GFR: Estimated Creatinine Clearance: 42.1 mL/min (by C-G formula based on SCr of 0.82 mg/dL). Liver Function Tests: Recent Labs  Lab 01/03/21 1933 01/04/21 0533  01/05/21 0249 01/07/21 0403  AST 60* 39 31 18  ALT 70* 52* 51* 36  ALKPHOS 64 53 50 45  BILITOT 0.7 0.5 0.5 0.4  PROT 7.8 6.0* 5.4* 6.1*  ALBUMIN 3.0* 2.3* 2.1* 2.6*   No results for input(s): LIPASE, AMYLASE in the last 168 hours. No results for input(s): AMMONIA in the last 168 hours. Coagulation Profile: No results for input(s): INR, PROTIME in the last 168 hours. Cardiac Enzymes: No results for input(s): CKTOTAL, CKMB, CKMBINDEX, TROPONINI in the last 168 hours. BNP (last 3 results) No results for input(s): PROBNP in the last 8760 hours. HbA1C: Recent Labs    01/06/21 0408  HGBA1C 6.8*   CBG: Recent Labs  Lab 01/07/21 0740 01/07/21 1057 01/07/21 1621 01/07/21 2021 01/08/21 0751  GLUCAP 124* 124* 208* 200* 122*  Lipid Profile: No results for input(s): CHOL, HDL, LDLCALC, TRIG, CHOLHDL, LDLDIRECT in the last 72 hours. Thyroid Function Tests: No results for input(s): TSH, T4TOTAL, FREET4, T3FREE, THYROIDAB in the last 72 hours. Anemia Panel: Recent Labs    01/07/21 1331  FERRITIN 366*  TIBC 215*  IRON 41   Urine analysis:    Component Value Date/Time   COLORURINE YELLOW 01/04/2021 1805   APPEARANCEUR CLEAR 01/04/2021 1805   APPEARANCEUR Clear 03/12/2017 1440   LABSPEC 1.016 01/04/2021 1805   PHURINE 5.0 01/04/2021 1805   GLUCOSEU NEGATIVE 01/04/2021 1805   HGBUR NEGATIVE 01/04/2021 1805   BILIRUBINUR NEGATIVE 01/04/2021 1805   BILIRUBINUR Negative 03/12/2017 1440   KETONESUR 5 (A) 01/04/2021 1805   PROTEINUR NEGATIVE 01/04/2021 1805   NITRITE NEGATIVE 01/04/2021 1805   LEUKOCYTESUR NEGATIVE 01/04/2021 1805   Sepsis Labs: _0 (procalcitonin:4,lacticidven:4)  ) Recent Results (from the past 240 hour(s))  Culture, blood (single)     Status: None (Preliminary result)   Collection Time: 01/03/21  7:33 PM   Specimen: BLOOD  Result Value Ref Range Status   Specimen Description   Final    BLOOD RIGHT ANTECUBITAL Performed at [facility], [place] [address] [place], [place] [address]    Special Requests   Final    BOTTLES DRAWN AEROBIC AND ANAEROBIC Blood Culture adequate volume Performed at [place] [address], [place], [place] [address]    Culture   Final    NO GROWTH 4 DAYS Performed at [facility], [place] [address] [place], [place] [address]    Report Status PENDING  Incomplete  Culture, blood (single)     Status: None (Preliminary result)   Collection Time: 01/03/21 10:20 PM   Specimen: BLOOD RIGHT FOREARM  Result Value Ref Range Status   Specimen Description   Final    BLOOD RIGHT FOREARM Performed at [facility], [place] [address] [place], [place] [address]    Special Requests   Final    BOTTLES DRAWN AEROBIC AND ANAEROBIC Blood Culture adequate volume Performed at [place] [address], [place], [place] [address]    Culture   Final    NO GROWTH 4 DAYS Performed at [facility], [place] [address] [place], [place] [address]    Report Status PENDING  Incomplete  Resp Panel by RT-PCR (Flu A&B, Covid) Nasopharyngeal Swab     Status: Abnormal   Collection Time: 01/04/21  9:49 AM   Specimen: Nasopharyngeal Swab; Nasopharyngeal(NP) swabs in vial transport medium  Result Value Ref Range Status   SARS Coronavirus 2 by RT PCR POSITIVE (A) NEGATIVE Final    Comment: RESULT CALLED TO, READ BACK BY AND VERIFIED WITH: LITHICUM,E. RN AT 1212 01/04/21 MULLINS,T (NOTE) SARS-CoV-2 target nucleic acids are DETECTED.  The SARS-CoV-2 RNA is generally detectable in upper respiratory specimens during the acute phase of infection. Positive results are indicative of the presence of the identified virus, but do not rule out bacterial infection or co-infection with other pathogens not detected by the test. Clinical correlation with patient history and other diagnostic information is necessary to determine patient infection status. The  expected result is Negative.  Fact Sheet for Patients: EntrepreneurPulse.com.au  Fact Sheet for Healthcare Providers: IncredibleEmployment.be  This test is not yet approved or cleared by the [place] FDA and  has been authorized for detection and/or diagnosis of SARS-CoV-2 by FDA under an Emergency Use Authorization (EUA).  This EUA  will remain in effect (meaning this tes t can be used) for the duration of  the COVID-19 declaration under Section 564(b)(1) of the Act, 21 U.S.C. section 360bbb-3(b)(1), unless the authorization is terminated or revoked sooner.     Influenza A by PCR NEGATIVE NEGATIVE Final   Influenza B by PCR NEGATIVE NEGATIVE Final    Comment: (NOTE) The Xpert Xpress SARS-CoV-2/FLU/RSV plus assay is intended as an aid in the diagnosis of influenza from Nasopharyngeal swab specimens and should not be used as a sole basis for treatment. Nasal washings and aspirates are unacceptable for Xpert Xpress SARS-CoV-2/FLU/RSV testing.  Fact Sheet for Patients: EntrepreneurPulse.com.au  Fact Sheet for Healthcare Providers: IncredibleEmployment.be  This test is not yet approved or cleared by the [place] FDA and has been authorized for detection and/or diagnosis of SARS-CoV-2 by FDA under an Emergency Use Authorization (EUA). This EUA will remain in effect (meaning this test can be used) for the duration of the COVID-19 declaration under Section 564(b)(1) of the Act, 21 U.S.C. section 360bbb-3(b)(1), unless the authorization is terminated or revoked.  Performed at [facility], [place] [address], [place], [place] [address]       Studies: No results found.  Scheduled Meds: . (feeding supplement) PROSource Plus  30 mL Oral Daily  . apixaban  5 mg Oral BID  . atorvastatin  80 mg Oral QHS  . carvedilol  25 mg Oral BID WC  . dexamethasone  6 mg Oral Daily  . feeding  supplement  237 mL Oral BID BM  . furosemide  20 mg Oral Daily  . insulin aspart  0-15 Units Subcutaneous TID WC  . insulin aspart  0-5 Units Subcutaneous QHS  . linagliptin  5 mg Oral Daily  . pantoprazole  40 mg Oral BID AC  . sacubitril-valsartan  1 tablet Oral BID  . sodium chloride flush  3 mL Intravenous Q12H    Continuous Infusions: . cefTRIAXone (ROCEPHIN)  IV 2 g (01/07/21 2208)     LOS: 4 days     Kayleen Memos, MD [facility] Pager [phone]  If 7PM-7AM, please contact night-coverage [email or web address] Password [facility] 01/08/2021, 11:26 AM

## 2021-01-09 LAB — CULTURE, BLOOD (SINGLE)
Culture: NO GROWTH
Culture: NO GROWTH
Special Requests: ADEQUATE
Special Requests: ADEQUATE

## 2021-01-09 LAB — BASIC METABOLIC PANEL
Anion gap: 8 (ref 5–15)
BUN: 18 mg/dL (ref 8–23)
CO2: 35 mmol/L — ABNORMAL HIGH (ref 22–32)
Calcium: 8.6 mg/dL — ABNORMAL LOW (ref 8.9–10.3)
Chloride: 92 mmol/L — ABNORMAL LOW (ref 98–111)
Creatinine, Ser: 0.81 mg/dL (ref 0.44–1.00)
GFR, Estimated: >60 mL/min (ref >60)
Glucose, Bld: 204 mg/dL — ABNORMAL HIGH (ref 70–99)
Potassium: 3.6 mmol/L (ref 3.5–5.1)
Sodium: 135 mmol/L (ref 135–145)

## 2021-01-09 LAB — CBC
HCT: 32 % — ABNORMAL LOW (ref 36.0–46.0)
Hemoglobin: 10.3 g/dL — ABNORMAL LOW (ref 12.0–15.0)
MCH: 29.8 pg (ref 26.0–34.0)
MCHC: 32.2 g/dL (ref 30.0–36.0)
MCV: 92.5 fL (ref 80.0–100.0)
Platelets: 229 10*3/uL (ref 150–400)
RBC: 3.46 MIL/uL — ABNORMAL LOW (ref 3.87–5.11)
RDW: 16.5 % — ABNORMAL HIGH (ref 11.5–15.5)
WBC: 22.6 10*3/uL — ABNORMAL HIGH (ref 4.0–10.5)
nRBC: 0 % (ref 0.0–0.2)

## 2021-01-09 LAB — PHOSPHORUS: Phosphorus: 3.3 mg/dL (ref 2.5–4.6)

## 2021-01-09 LAB — GLUCOSE, CAPILLARY
Glucose-Capillary: 122 mg/dL — ABNORMAL HIGH (ref 70–99)
Glucose-Capillary: 174 mg/dL — ABNORMAL HIGH (ref 70–99)
Glucose-Capillary: 182 mg/dL — ABNORMAL HIGH (ref 70–99)
Glucose-Capillary: 169 mg/dL — ABNORMAL HIGH (ref 70–99)

## 2021-01-09 LAB — PROCALCITONIN: Procalcitonin: <0.1 ng/mL

## 2021-01-09 LAB — MAGNESIUM: Magnesium: 1.7 mg/dL (ref 1.7–2.4)

## 2021-01-09 LAB — C-REACTIVE PROTEIN: CRP: 0.9 mg/dL (ref <1.0)

## 2021-01-09 LAB — BRAIN NATRIURETIC PEPTIDE: B Natriuretic Peptide: 896.1 pg/mL — ABNORMAL HIGH (ref 0.0–100.0)

## 2021-01-09 MED ORDER — LOPERAMIDE HCL 2 MG PO CAPS
2.0000 mg | ORAL_CAPSULE | Freq: Four times a day (QID) | ORAL | Status: DC
  Administered 2021-01-09 – 2021-01-10 (×3): 2 mg via ORAL
  Filled 2021-01-09 (×3): qty 1

## 2021-01-09 MED ORDER — DEXAMETHASONE 4 MG PO TABS
4.0000 mg | ORAL_TABLET | Freq: Every day | ORAL | Status: DC
  Administered 2021-01-10: 4 mg via ORAL
  Filled 2021-01-09: qty 1

## 2021-01-09 MED ORDER — LOPERAMIDE HCL 2 MG PO CAPS: 2.0000 mg | ORAL_CAPSULE | Freq: Four times a day (QID) | ORAL | Status: DC

## 2021-01-09 NOTE — Progress Notes (Signed)
PROGRESS NOTE  Helen [name] GPQ:[id number] DOB: [birth year] DOA: 01/03/2021 PCP: Vernie Shanks, MD  HPI/Recap of past 24 hours: Helen [name] is a 87 y.o. F with HFREF 25-30% in 2018, HTN, pAF on Eliquis, HTN, DM, hx stroke, PVD, BrCA s/p chemo and double mastectomy and hx hyponatremia who presented with cough and confusion.  Onset around 12/23/20, tested positive for COVID on 5/8.  She became progressively weaker, more frequently confused, and her cough persisted so she came to the ER on 01/03/21.  Work-up revealed pneumonia, positive COVID-19 screen test, electrolyte derangement, acute on chronic hypoxia.  01/09/21: Seen and examined at her bedside.  She reports persistent diarrhea and generalized weakness.  Currently on Imodium scheduled 4 times daily.  Continue antidiarrhea medication.  Will obtain labs to check her electrolytes.   Assessment/Plan: Principal Problem:   CAP (community acquired pneumonia) Active Problems:   Carotid stenosis   Hypercholesteremia   Diabetes mellitus without complication (HCC)   Peripheral vascular disease (HCC)   Acute encephalopathy   Hyponatremia   Essential hypertension   Anemia of chronic disease   Chronic combined systolic and diastolic heart failure (HCC)   Shortness of breath   COVID-19  COVID-19 viral infection Found to have typical signs and Symptoms of COVID-19 viral infection, nonproductive cough, opacities on chest x-ray, fatigue, diarrhea. Continue Decadron, wean off Continue isolation for 21-day from May 2>> 01/13/2021. Continue flutter valve and incentive spirometer Mobilize as tolerated  COVID-19 viral gastroenteritis Persistent diarrhea Increase p.o. fluid intake to avoid dehydration Imodium scheduled 5 milligrams 4 times daily. Not on IV fluid due to underlying heart failure with reduced EF 25%. Obtain labs to check electrolytes levels.  Right lower lobe community-acquired pneumonia, POA Complete 7 days of IV Rocephin on  01/09/2021 Completed 5 days of azithromycin. Continue pulmonary toilet Incentive spirometer Mobilize as tolerated  Acute on chronic hypoxic respiratory failure She is on 2 L oxygen nasal cannula at nighttime Continue to wean off oxygen supplementation during the day. Currently requiring 2 L continuously. Obtain home oxygen evaluation.  Type 2 diabetes with hyperglycemia likely exacerbated by steroids Last A1c 6.8%. Continue insulin sliding care, linagliptin  HFREF 25-30% Continue home cardiac medications Currently not on diuretics to avoid dehydration in the setting of persistent diarrhea Currently not on IV fluid to avoid volume overload in the setting of HFrEF EF 25% Closely monitor volume status. Net I&O +934 cc. Continue strict I's and O's and daily weight  Paroxysmal A. fib Continue Eliquis for secondary CVA prevention. Continue home Coreg Rate is controlled.  Resolved acute metabolic encephalopathy likely secondary to COVID-19 viral infection. Mentation improved per her daughter at bedside. Reorient as needed.  GERD Continue home PPI  Hyperlipidemia Continue home statin   Code Status: DNR  Family Communication: Daughter at bedside on 01/08/21  Disposition Plan: Likely will dc back to independent living facility once her diarrhea has improved, likely on 01/10/2021.   Consultants:  None  Procedures:  None  Antimicrobials:  Rocephin  Azithromycin  DVT prophylaxis:  Eliquis  Status is: Inpatient   Dispo: The patient is from: Independent living facility              Anticipated d/c is to: Independent living facility              Patient currently not stable for dc   Difficult to place patient, no applicable        Objective: Vitals:   01/08/21 0517 01/08/21 1220 01/09/21  0528 01/09/21 1215  BP: (!) 164/65 101/78 (!) 157/64 102/63  Pulse: 69 82 86 86  Resp: 18 (!) 21 20 20   Temp: 97.7 F (36.5 C) 98.2 F (36.8 C) 98.5 F (36.9 C)  97.7 F (36.5 C)  TempSrc: Oral Oral Oral Oral  SpO2: 97% 94% 97% (!) 86%  Weight:      Height:        Intake/Output Summary (Last 24 hours) at 01/09/2021 1338 Last data filed at 01/09/2021 0959 Gross per 24 hour  Intake --  Output 3 ml  Net -3 ml   Filed Weights   01/03/21 1704 01/05/21 0421  Weight: 65.8 kg 68.2 kg    Exam:  . General: 87 y.o. year-old female frail-appearing no acute distress.  She is alert interactive.  .  Cardiovascular: Irregular rate and rhythm no rubs or gallops. [name] Respiratory: Mild rales at bases no wheezing noted.  Poor inspiratory effort.   . Abdomen: Soft mildly distended bowel sounds present.  Nontender.   . Musculoskeletal: No lower extremity edema bilaterally. . Skin: No ulcerative lesions noted. [name] Psychiatry: Mood is appropriate for condition and setting.  Data Reviewed: CBC: Recent Labs  Lab 01/03/21 1933 01/04/21 0533 01/05/21 0249 01/07/21 0403  WBC 16.1* 12.6* 11.4* 15.3*  NEUTROABS 13.5*  --   --   --   HGB 10.6* 8.2* 8.4* 9.1*  HCT 32.3* 25.1* 25.9* 28.3*  MCV 91.2 91.3 91.8 92.2  PLT 201 147* 164 097   Basic Metabolic Panel: Recent Labs  Lab 01/03/21 1933 01/03/21 2303 01/04/21 0533 01/05/21 0249 01/06/21 0408 01/07/21 0403  NA 132*  --  133* 135  --  137  K 3.5  --  3.5 4.0  --  3.5  CL 95*  --  97* 101  --  98  CO2 27  --  27 28  --  32  GLUCOSE 129*  --  115* 179*  --  131*  BUN 25*  --  22 24*  --  21  CREATININE 0.95  --  0.82 0.85  --  0.82  CALCIUM 8.9  --  8.1* 7.6*  --  8.4*  MG  --  1.5*  --  1.4* 1.9  --    GFR: Estimated Creatinine Clearance: 42.1 mL/min (by C-G formula based on SCr of 0.82 mg/dL). Liver Function Tests: Recent Labs  Lab 01/03/21 1933 01/04/21 0533 01/05/21 0249 01/07/21 0403  AST 60* 39 31 18  ALT 70* 52* 51* 36  ALKPHOS 64 53 50 45  BILITOT 0.7 0.5 0.5 0.4  PROT 7.8 6.0* 5.4* 6.1*  ALBUMIN 3.0* 2.3* 2.1* 2.6*   No results for input(s): LIPASE, AMYLASE in the last 168  hours. No results for input(s): AMMONIA in the last 168 hours. Coagulation Profile: No results for input(s): INR, PROTIME in the last 168 hours. Cardiac Enzymes: No results for input(s): CKTOTAL, CKMB, CKMBINDEX, TROPONINI in the last 168 hours. BNP (last 3 results) No results for input(s): PROBNP in the last 8760 hours. HbA1C: No results for input(s): HGBA1C in the last 72 hours. CBG: Recent Labs  Lab 01/08/21 1158 01/08/21 1608 01/08/21 2101 01/09/21 0755 01/09/21 1216  GLUCAP 123* 187* 197* 122* 174*   Lipid Profile: No results for input(s): CHOL, HDL, LDLCALC, TRIG, CHOLHDL, LDLDIRECT in the last 72 hours. Thyroid Function Tests: No results for input(s): TSH, T4TOTAL, FREET4, T3FREE, THYROIDAB in the last 72 hours. Anemia Panel: Recent Labs    01/07/21 1331  FERRITIN  366*  TIBC 215*  IRON 41   Urine analysis:    Component Value Date/Time   COLORURINE YELLOW 01/04/2021 1805   APPEARANCEUR CLEAR 01/04/2021 1805   APPEARANCEUR Clear 03/12/2017 1440   LABSPEC 1.016 01/04/2021 1805   PHURINE 5.0 01/04/2021 1805   GLUCOSEU NEGATIVE 01/04/2021 1805   HGBUR NEGATIVE 01/04/2021 1805   BILIRUBINUR NEGATIVE 01/04/2021 1805   BILIRUBINUR Negative 03/12/2017 1440   KETONESUR 5 (A) 01/04/2021 1805   PROTEINUR NEGATIVE 01/04/2021 1805   NITRITE NEGATIVE 01/04/2021 1805   LEUKOCYTESUR NEGATIVE 01/04/2021 1805   Sepsis Labs: @LABRCNTIP (procalcitonin:4,lacticidven:4)  ) Recent Results (from the past 240 hour(s))  Culture, blood (single)     Status: None   Collection Time: 01/03/21  7:33 PM   Specimen: BLOOD  Result Value Ref Range Status   Specimen Description   Final    BLOOD RIGHT ANTECUBITAL Performed at [facility], [place] [address] [place], [place] [address]    Special Requests   Final    BOTTLES DRAWN AEROBIC AND ANAEROBIC Blood Culture adequate volume Performed at [place] [address], [place], [place] [address]     Culture   Final    NO GROWTH 5 DAYS Performed at [facility], [place] [address], [place], [place] [address]    Report Status 01/09/2021 FINAL  Final  Culture, blood (single)     Status: None   Collection Time: 01/03/21 10:20 PM   Specimen: BLOOD RIGHT FOREARM  Result Value Ref Range Status   Specimen Description   Final    BLOOD RIGHT FOREARM Performed at [facility], [place] [address], [place], [place] [address]    Special Requests   Final    BOTTLES DRAWN AEROBIC AND ANAEROBIC Blood Culture adequate volume Performed at [place] [address], [place], [place] [address]    Culture   Final    NO GROWTH 5 DAYS Performed at [facility], [place] [address] [place], [place] [address]    Report Status 01/09/2021 FINAL  Final  Resp Panel by RT-PCR (Flu A&B, Covid) Nasopharyngeal Swab     Status: Abnormal   Collection Time: 01/04/21  9:49 AM   Specimen: Nasopharyngeal Swab; Nasopharyngeal(NP) swabs in vial transport medium  Result Value Ref Range Status   SARS Coronavirus 2 by RT PCR POSITIVE (A) NEGATIVE Final    Comment: RESULT CALLED TO, READ BACK BY AND VERIFIED WITH: LITHICUM,E. RN AT 1212 01/04/21 MULLINS,T (NOTE) SARS-CoV-2 target nucleic acids are DETECTED.  The SARS-CoV-2 RNA is generally detectable in upper respiratory specimens during the acute phase of infection. Positive results are indicative of the presence of the identified virus, but do not rule out bacterial infection or co-infection with other pathogens not detected by the test. Clinical correlation with patient history and other diagnostic information is necessary to determine patient infection status. The expected result is Negative.  Fact Sheet for Patients: EntrepreneurPulse.com.au  Fact Sheet for Healthcare Providers: IncredibleEmployment.be  This test is not yet approved or cleared by the [place] FDA and  has been  authorized for detection and/or diagnosis of SARS-CoV-2 by FDA under an Emergency Use Authorization (EUA).  This EUA will remain in effect (meaning this tes t can be used) for the duration of  the COVID-19 declaration under Section 564(b)(1) of the Act, 21 U.S.C. section 360bbb-3(b)(1), unless the authorization is terminated or revoked sooner.     Influenza A by PCR NEGATIVE NEGATIVE  Final   Influenza B by PCR NEGATIVE NEGATIVE Final    Comment: (NOTE) The Xpert Xpress SARS-CoV-2/FLU/RSV plus assay is intended as an aid in the diagnosis of influenza from Nasopharyngeal swab specimens and should not be used as a sole basis for treatment. Nasal washings and aspirates are unacceptable for Xpert Xpress SARS-CoV-2/FLU/RSV testing.  Fact Sheet for Patients: EntrepreneurPulse.com.au  Fact Sheet for Healthcare Providers: IncredibleEmployment.be  This test is not yet approved or cleared by the [place] FDA and has been authorized for detection and/or diagnosis of SARS-CoV-2 by FDA under an Emergency Use Authorization (EUA). This EUA will remain in effect (meaning this test can be used) for the duration of the COVID-19 declaration under Section 564(b)(1) of the Act, 21 U.S.C. section 360bbb-3(b)(1), unless the authorization is terminated or revoked.  Performed at [facility], [place] [address], [place], [place] [address]       Studies: No results found.  Scheduled Meds: . (feeding supplement) PROSource Plus  30 mL Oral Daily  . apixaban  5 mg Oral BID  . atorvastatin  80 mg Oral QHS  . carvedilol  25 mg Oral BID WC  . dexamethasone  6 mg Oral Daily  . feeding supplement  237 mL Oral BID BM  . furosemide  20 mg Oral Daily  . insulin aspart  0-15 Units Subcutaneous TID WC  . insulin aspart  0-5 Units Subcutaneous QHS  . linagliptin  5 mg Oral Daily  . loperamide  2 mg Oral QID  . pantoprazole  40 mg Oral BID AC  .  sacubitril-valsartan  1 tablet Oral BID  . sodium chloride flush  3 mL Intravenous Q12H    Continuous Infusions: . cefTRIAXone (ROCEPHIN)  IV 2 g (01/08/21 2155)     LOS: 5 days     Kayleen Memos, MD [facility] Pager [phone]  If 7PM-7AM, please contact night-coverage [email or web address] Password [facility] 01/09/2021, 1:38 PM

## 2021-01-09 NOTE — Progress Notes (Signed)
Physical Therapy Treatment Patient Details Name: Helen [name] MRN: [id number] DOB: 1930-01-07 Today's Date: 01/09/2021    History of Present Illness 87 yo female admitted with [place], [place]. Hx of CHF, CVA, DM, breast ca-s/p double mastectomy, anema    PT Comments    Pt ambulated 40' with RW in room, SaO2 85% on room air walking, no loss of balance. Ambulation distance limited by fatigue.  Instructed pt in seated BLE strengthening exercises.   Follow Up Recommendations  Home health PT     Equipment Recommendations  None recommended by PT    Recommendations for Other Services       Precautions / Restrictions Precautions Precautions: Other (comment);Fall Precaution Comments: monitor O2 Restrictions Weight Bearing Restrictions: No    Mobility  Bed Mobility Overal bed mobility: Needs Assistance Bed Mobility: Sit to Supine       Sit to supine: Min assist   General bed mobility comments: min A for BLEs into bed    Transfers Overall transfer level: Needs assistance Equipment used: Rolling walker (2 wheeled) Transfers: Sit to/from Stand Sit to Stand: Supervision         General transfer comment: VCs for hand placement  Ambulation/Gait Ambulation/Gait assistance: Min guard Gait Distance (Feet): 40 Feet Assistive device: Rolling walker (2 wheeled) Gait Pattern/deviations: Step-through pattern;Decreased stride length Gait velocity: decr   General Gait Details: no loss of balance, SaO2 85% on room air walking, VCs for pursed lip breathing   Stairs             Wheelchair Mobility    Modified Rankin (Stroke Patients Only)       Balance Overall balance assessment: Modified Independent                                          Cognition Arousal/Alertness: Awake/alert Behavior During Therapy: WFL for tasks assessed/performed Overall Cognitive Status: Within Functional Limits for tasks assessed                                         Exercises General Exercises - Lower Extremity Ankle Circles/Pumps: AROM;Both;10 reps;Seated Long Arc Quad: Both;AROM;10 reps;Seated Hip Flexion/Marching: AROM;Both;10 reps;Seated    General Comments        Pertinent Vitals/Pain Pain Assessment: No/denies pain    Home Living                      Prior Function            PT Goals (current goals can now be found in the care plan section) Acute Rehab PT Goals Patient Stated Goal: regain plof. PT Goal Formulation: With patient/family Time For Goal Achievement: 01/20/21 Potential to Achieve Goals: Good Progress towards PT goals: Progressing toward goals    Frequency    Min 3X/week      PT Plan Current plan remains appropriate    Co-evaluation              AM-PAC PT "6 Clicks" Mobility   Outcome Measure  Help needed turning from your back to your side while in a flat bed without using bedrails?: None Help needed moving from lying on your back to sitting on the side of a flat bed without using bedrails?: None Help needed moving to and  from a bed to a chair (including a wheelchair)?: A Little Help needed standing up from a chair using your arms (e.g., wheelchair or bedside chair)?: A Little Help needed to walk in hospital room?: A Little Help needed climbing 3-5 steps with a railing? : A Little 6 Click Score: 20    End of Session Equipment Utilized During Treatment: Gait belt;Oxygen Activity Tolerance: Patient tolerated treatment well Patient left: with call bell/phone within reach;in bed;with bed alarm set Nurse Communication: Mobility status PT Visit Diagnosis: Difficulty in walking, not elsewhere classified (R26.2)     Time: 2992-4268 PT Time Calculation (min) (ACUTE ONLY): 21 min  Charges:  $Gait Training: 8-22 mins                     Blondell Reveal Kistler PT 01/09/2021  Acute Rehabilitation Services Pager [phone] Office [phone]

## 2021-01-09 NOTE — Plan of Care (Signed)
  Problem: Education: Goal: Knowledge of risk factors and measures for prevention of condition will improve Outcome: Progressing   Problem: Coping: Goal: Psychosocial and spiritual needs will be supported Outcome: Progressing   Problem: Respiratory: Goal: Will maintain a patent airway Outcome: Progressing Goal: Complications related to the disease process, condition or treatment will be avoided or minimized Outcome: Progressing   Problem: Education: Goal: Ability to demonstrate management of disease process will improve Outcome: Progressing Goal: Ability to verbalize understanding of medication therapies will improve Outcome: Progressing Goal: Individualized Educational Video(s) Outcome: Progressing   

## 2021-01-09 NOTE — Progress Notes (Signed)
SATURATION QUALIFICATIONS: (This note is used to comply with regulatory documentation for home oxygen)  Patient Saturations on Room Air at Rest =89 %  Patient Saturations on Room Air while Ambulating = 85%  Patient Saturations on 2 Liters of oxygen while Ambulating = 95%  Please briefly explain why patient needs home oxygen: 

## 2021-01-10 LAB — GLUCOSE, CAPILLARY
Glucose-Capillary: 116 mg/dL — ABNORMAL HIGH (ref 70–99)
Glucose-Capillary: 163 mg/dL — ABNORMAL HIGH (ref 70–99)

## 2021-01-10 MED ORDER — PROSOURCE PLUS PO LIQD: 30.0000 mL | Freq: Every day | ORAL | 0 refills | Status: AC

## 2021-01-10 MED ORDER — LOPERAMIDE HCL 2 MG PO CAPS: 2.0000 mg | ORAL_CAPSULE | ORAL | 0 refills | Status: DC | PRN

## 2021-01-10 MED ORDER — LINAGLIPTIN 5 MG PO TABS: 5.0000 mg | ORAL_TABLET | Freq: Every day | ORAL | 0 refills | Status: DC

## 2021-01-10 MED ORDER — FUROSEMIDE 10 MG/ML IJ SOLN
20.0000 mg | INTRAMUSCULAR | Status: AC
  Administered 2021-01-10: 20 mg via INTRAVENOUS
  Filled 2021-01-10: qty 2

## 2021-01-10 MED ORDER — POTASSIUM CHLORIDE CRYS ER 20 MEQ PO TBCR
40.0000 meq | EXTENDED_RELEASE_TABLET | ORAL | Status: AC
  Administered 2021-01-10: 40 meq via ORAL
  Filled 2021-01-10: qty 2

## 2021-01-10 MED ORDER — ENSURE ENLIVE PO LIQD: 237.0000 mL | Freq: Two times a day (BID) | ORAL | 0 refills | Status: AC

## 2021-01-10 MED ORDER — MAGNESIUM OXIDE -MG SUPPLEMENT 400 (240 MG) MG PO TABS: 800.0000 mg | ORAL_TABLET | ORAL | Status: DC

## 2021-01-10 NOTE — Discharge Summary (Addendum)
Discharge Summary  Helen [name] HGD:[id number] DOB: [birth year]  PCP: Vernie Shanks, MD  Admit date: 01/03/2021 Discharge date: 01/10/2021  Time spent: 35 minutes   Recommendations for Outpatient Follow-up:  1. Follow up with cardiology in 1-2 weeks 2. Follow up with your PCP in 1-2 weeks 3. Please take your medications as prescribed 4. Continue PT OT with assistance and fall precautions.  Discharge Diagnoses:  Active Hospital Problems   Diagnosis Date Noted  . CAP (community acquired pneumonia) 01/03/2021  . COVID-19 01/04/2021  . Shortness of breath 11/11/2016  . Anemia of chronic disease 03/24/2016  . Chronic combined systolic and diastolic heart failure ([facility]) 03/24/2016  . Essential hypertension   . Acute encephalopathy 12/13/2015  . Hyponatremia 12/13/2015  . Diabetes mellitus without complication ([place])   . Hypercholesteremia   . Peripheral vascular disease ([facility])   . Carotid stenosis 03/13/2015    Resolved Hospital Problems  No resolved problems to display.    Discharge Condition: Stable.  Diet recommendation: Heart healthy carb modified diet is recommended.  Vitals:   01/10/21 1222 01/10/21 1246  BP: (!) 84/56 (!) 93/50  Pulse: 72 72  Resp: 20   Temp: (!) 97.5 F (36.4 C)   SpO2: 100%     History of present illness:  Helen [name] is a87 y.o.F with HFREF 25-30% in 2018, HTN, pAF on Eliquis, HTN, DM2, hx stroke, PVD, BrCA s/p chemo and double mastectomy who presented with cough and confusion.  Tested positive for COVID-19 on 12/29/20 at outside facility and on 01/04/2021 at this facility. She became progressively weaker, more frequently confused, and her cough persisted so she came to the ER.  Work-up revealed pneumonia, COVID-19 viral infection, electrolyte derangement, acute on chronic hypoxic respiratory failure.  Obstacles complicated by [id number] viral gastroenteritis, improved on Imodium.  01/10/21:  Seen and examined at bedside.  There were no acute  events overnight.  She has no new complaints.  Hospital Course:  Principal Problem:   CAP (community acquired pneumonia) Active Problems:   Carotid stenosis   Hypercholesteremia   Diabetes mellitus without complication (HCC)   Peripheral vascular disease (HCC)   Acute encephalopathy   Hyponatremia   Essential hypertension   Anemia of chronic disease   Chronic combined systolic and diastolic heart failure (HCC)   Shortness of breath   COVID-19  COVID-19 viral infection Found to have typical signs and Symptoms of COVID-19 viral infection, nonproductive cough, opacities on chest x-ray, fatigue, diarrhea. Reported congestion and cough around 12/23/20. Completed 7 days of steroids. Continue isolation for 21-day from May 2>> 01/13/2021. Continue flutter valve and incentive spirometer Mobilize as tolerated CRP 0.9 on 01/09/2021 from 8.0 on 01/09/2021.  Right lower lobe community-acquired pneumonia, POA Completed 7 days of IV Rocephin on 01/09/2021 Completed 5 days of azithromycin. Incentive spirometer Mobilize as tolerated  COVID-19 viral gastroenteritis Avoid dehydration Continue Imodium as needed  Acute on chronic hypoxic respiratory failure She is on 2 L oxygen nasal cannula at nighttime Currently requiring 2 L continuously to maintain O2 saturation above 92%. Incentive spirometer Mobilize as tolerated  Type 2 diabetes with hyperglycemia likely exacerbated by steroids Last A1c 6.8%. Continue linagliptin Follow-up with your PCP.  HFREF 25-30% Resume home cardiac medications Follow-up with your cardiologist in 1 to 2 weeks.  Paroxysmal A. fib Continue home Eliquis for CVA prevention. Continue home Coreg Rate is controlled.  Resolved acute metabolic encephalopathy likely secondary to COVID-19 viral infection. Mentation improved. Reorient as needed.  Chronic anxiety/depression Home  Celexa held due to prolonged QTC. Follow-up with your PCP.  GERD Continue  home PPI  Hyperlipidemia Continue home statin  Prolonged QTC QTC 506 on 01/04/2021 Repeat QTC 505 on 01/10/21 Avoid QTC prolonging agents. Optimize potassium and magnesium levels. Follow-up with cardiology.   Code Status: DNR  Family Communication:  Updated her daughter Helen [name] via phone on 01/10/2021.   Consultants:  None  Procedures:  None  Antimicrobials:  Rocephin  Azithromycin  DVT prophylaxis:  Eliquis     Discharge Exam: BP (!) 93/50   Pulse 72   Temp (!) 97.5 F (36.4 C) (Oral)   Resp 20   Ht 5' 2"  (1.575 m)   Wt 67.9 kg   SpO2 100%   BMI 27.38 kg/m  . General: 87 y.o. year-old female well developed well nourished in no acute distress.  Alert and interactive. . Cardiovascular: Regular rate and rhythm with no rubs or gallops.  No thyromegaly or JVD noted.   [name] Respiratory: Clear to auscultation with no wheezes or rales. Good inspiratory effort. . Abdomen: Soft nontender nondistended with normal bowel sounds x4 quadrants. . Musculoskeletal: No lower extremity edema. 2/4 pulses in all 4 extremities. . Skin: No ulcerative lesions noted or rashes, . Psychiatry: Mood is appropriate for condition and setting  Discharge Instructions You were cared for by a hospitalist during your hospital stay. If you have any questions about your discharge medications or the care you received while you were in the hospital after you are discharged, you can call the unit and asked to speak with the hospitalist on call if the hospitalist that took care of you is not available. Once you are discharged, your primary care physician will handle any further medical issues. Please note that NO REFILLS for any discharge medications will be authorized once you are discharged, as it is imperative that you return to your primary care physician (or establish a relationship with a primary care physician if you do not have one) for your aftercare needs so that they can reassess your need  for medications and monitor your lab values.   Allergies as of 01/10/2021      Reactions   Morphine And Related Nausea And Vomiting   Other Other (See Comments)   Other reaction(s): Other (See Comments) Barley & Carrots: learned through allergy testing. Unknown reaction-- MD said not very allergic but not to eat large quantities Barley & Carrots: learned through allergy testing. Unknown reaction-- MD said not very allergic but not to eat large quantities   Sulfa Antibiotics Hives   Sulfasalazine Hives   Nickel Rash      Medication List    STOP taking these medications   citalopram 20 MG tablet Commonly known as: CELEXA     TAKE these medications   atorvastatin 80 MG tablet Commonly known as: LIPITOR Take 80 mg by mouth at bedtime.   BIOTIN PO Take 1 tablet by mouth daily.   bismuth subsalicylate 662 MG chewable tablet Commonly known as: PEPTO BISMOL Chew 524 mg by mouth as needed for indigestion or diarrhea or loose stools.   carvedilol 25 MG tablet Commonly known as: COREG TAKE 1 TABLET BY MOUTH  TWICE DAILY WITH MEALS   cholecalciferol 1000 units tablet Commonly known as: VITAMIN D Take 1,000 Units by mouth every evening.   Eliquis 5 MG Tabs tablet Generic drug: apixaban TAKE 1 TABLET BY MOUTH  TWICE DAILY What changed: how much to take   Entresto 49-51 MG Generic drug: sacubitril-valsartan  TAKE 1 TABLET BY MOUTH  TWICE DAILY   feeding supplement Liqd Take 237 mLs by mouth 2 (two) times daily between meals for 7 days.   (feeding supplement) PROSource Plus liquid Take 30 mLs by mouth daily for 7 days.   furosemide 20 MG tablet Commonly known as: LASIX TAKE 1 TABLET BY MOUTH  DAILY   linagliptin 5 MG Tabs tablet Commonly known as: TRADJENTA Take 1 tablet (5 mg total) by mouth daily.   loperamide 2 MG capsule Commonly known as: IMODIUM Take 1 capsule (2 mg total) by mouth as needed for diarrhea or loose stools.   magnesium oxide 400 MG  tablet Commonly known as: MAG-OX 1 tablet by mouth twice a day for 1 week and then 1 daily What changed:   how much to take  how to take this  when to take this  additional instructions   multivitamin with minerals Tabs tablet Take 1 tablet by mouth daily.   pantoprazole 40 MG tablet Commonly known as: PROTONIX Take 1 tablet (40 mg total) by mouth 2 (two) times daily before a meal. What changed:   when to take this  reasons to take this   senna-docusate 8.6-50 MG tablet Commonly known as: Senokot-S Take 2 tablets by mouth at bedtime as needed for mild constipation or moderate constipation.   vitamin C 500 MG tablet Commonly known as: ASCORBIC ACID Take 500 mg by mouth daily.            Durable Medical Equipment  (From admission, onward)         Start     Ordered   01/10/21 0730  For home use only DME oxygen  Once       Question Answer Comment  Length of Need 6 Months   Mode or (Route) Nasal cannula   Liters per Minute 2   Frequency Continuous (stationary and portable oxygen unit needed)   Oxygen delivery system Gas      01/10/21 0729         Allergies  Allergen Reactions  . Morphine And Related Nausea And Vomiting  . Other Other (See Comments)    Other reaction(s): Other (See Comments) Barley & Carrots: learned through allergy testing. Unknown reaction-- MD said not very allergic but not to eat large quantities Barley & Carrots: learned through allergy testing. Unknown reaction-- MD said not very allergic but not to eat large quantities  . Sulfa Antibiotics Hives  . Sulfasalazine Hives  . Nickel Rash    Follow-up Information    Vernie Shanks, MD. Call in 1 day(s).   Specialty: Family Medicine Why: Please call for a post hospital follow-up appointment. Contact information: [place] [address] [phone]        Skeet Latch, MD .   Specialty: Cardiology Contact information: [address] [place]  [place] [address] [phone]        [facility] Follow up.   Specialty: Home Health Services Why: Will follow you for [facility]. Please call [phone] for any questions or concerns.  Contact information: [place] [address] [place]. Follow up.   Specialty: Physical Therapy Why: [facility] will follow you for HHPT/OT please call [facility] if you have any questions or concerns.  Contact information: [address] [place] [address] [phone]        [facility] Follow up.  Why: Oxygen from this company also called [employer].  Contact information: [place] [address] [phone]                The results of significant diagnostics from this hospitalization (including imaging, microbiology, ancillary and laboratory) are listed below for reference.    Significant Diagnostic Studies: DG Chest 1 View  Result Date: 01/03/2021 CLINICAL DATA:  Shortness of breath.  Confusion. EXAM: CHEST  1 VIEW COMPARISON:  Dec 30, 2020 FINDINGS: There is airspace opacity in the right base. There is scarring with volume loss throughout portions of the right lung. The left lung is clear. Heart size and pulmonary vascularity are normal. No adenopathy. There is aortic atherosclerosis. No bone lesions. IMPRESSION: Airspace opacity right base, likely focus of pneumonia. Areas of scarring noted with volume loss in the right lung, stable. Left lung clear. Stable cardiac silhouette. Aortic Atherosclerosis (ICD10-I70.0). Electronically Signed   By: Lowella Grip III M.D.   On: 01/03/2021 17:53   DG Chest Port 1 View  Result Date: 12/30/2020 CLINICAL DATA:  Shortness of breath, COVID-19 positivity EXAM: PORTABLE CHEST 1 VIEW COMPARISON:  11/11/2016 FINDINGS: Cardiac shadow is prominent but stable. Aortic calcifications are noted. Chronic scarring is noted in the lungs  bilaterally similar to that seen on the prior study. No new focal infiltrate is seen. No bony abnormality is noted. IMPRESSION: Chronic changes in the right lung without acute abnormality. Electronically Signed   By: Inez Catalina M.D.   On: 12/30/2020 22:43    Microbiology: Recent Results (from the past 240 hour(s))  Culture, blood (single)     Status: None   Collection Time: 01/03/21  7:33 PM   Specimen: BLOOD  Result Value Ref Range Status   Specimen Description   Final    BLOOD RIGHT ANTECUBITAL Performed at [facility] [address], [place], [place] [address]    Special Requests   Final    BOTTLES DRAWN AEROBIC AND ANAEROBIC Blood Culture adequate volume Performed at [facility] [address], [place], [place] [address]    Culture   Final    NO GROWTH 5 DAYS Performed at [facility], [place] [address], [place], [place] [address]    Report Status 01/09/2021 FINAL  Final  Culture, blood (single)     Status: None   Collection Time: 01/03/21 10:20 PM   Specimen: BLOOD RIGHT FOREARM  Result Value Ref Range Status   Specimen Description   Final    BLOOD RIGHT FOREARM Performed at [facility], [place] [address] [place], [place] [address]    Special Requests   Final    BOTTLES DRAWN AEROBIC AND ANAEROBIC Blood Culture adequate volume Performed at [facility] [address] [place], [place] [address]    Culture   Final    NO GROWTH 5 DAYS Performed at [facility], [place] [address] [place], [place] [address]    Report Status 01/09/2021 FINAL  Final  Resp Panel by RT-PCR (Flu A&B, Covid) Nasopharyngeal Swab     Status: Abnormal   Collection Time: 01/04/21  9:49 AM   Specimen: Nasopharyngeal Swab; Nasopharyngeal(NP) swabs in vial transport medium  Result Value Ref Range Status   SARS Coronavirus 2 by RT PCR POSITIVE (A) NEGATIVE Final    Comment: RESULT CALLED TO, READ BACK BY AND VERIFIED  WITH: LITHICUM,E. RN AT 1212 01/04/21 MULLINS,T (NOTE) SARS-CoV-2 target nucleic acids are DETECTED.  The SARS-CoV-2 RNA is generally detectable in upper respiratory specimens during the acute phase of infection. Positive results are indicative of the presence of the identified virus, but do not rule out bacterial infection or co-infection with other pathogens not detected by the test. Clinical correlation with patient history and other diagnostic information is necessary to determine patient infection status. The expected result is Negative.  Fact Sheet for Patients: EntrepreneurPulse.com.au  Fact Sheet for Healthcare Providers: IncredibleEmployment.be  This test is not yet approved or cleared by the [place] FDA and  has been authorized for detection and/or diagnosis of SARS-CoV-2 by FDA under an Emergency Use Authorization (EUA).  This EUA will remain in effect (meaning this tes t can be used) for the duration of  the COVID-19 declaration under Section 564(b)(1) of the Act, 21 U.S.C. section 360bbb-3(b)(1), unless the authorization is terminated or revoked sooner.     Influenza A by PCR NEGATIVE NEGATIVE Final   Influenza B by PCR NEGATIVE NEGATIVE Final    Comment: (NOTE) The Xpert Xpress SARS-CoV-2/FLU/RSV plus assay is intended as an aid in the diagnosis of influenza from Nasopharyngeal swab specimens and should not be used as a sole basis for treatment. Nasal washings and aspirates are unacceptable for Xpert Xpress SARS-CoV-2/FLU/RSV testing.  Fact Sheet for Patients: EntrepreneurPulse.com.au  Fact Sheet for Healthcare Providers: IncredibleEmployment.be  This test is not yet approved or cleared by the [place] FDA and has been authorized for detection and/or diagnosis of SARS-CoV-2 by FDA under an Emergency Use Authorization (EUA). This EUA will remain in effect (meaning this test can  be used) for the duration of the COVID-19 declaration under Section 564(b)(1) of the Act, 21 U.S.C. section 360bbb-3(b)(1), unless the authorization is terminated or revoked.  Performed at [facility], [place] [address], [place], [place] [address]      Labs: Basic Metabolic Panel: Recent Labs  Lab 01/03/21 1933 01/03/21 2303 01/04/21 0533 01/05/21 0249 01/06/21 0408 01/07/21 0403 01/09/21 1428  NA 132*  --  133* 135  --  137 135  K 3.5  --  3.5 4.0  --  3.5 3.6  CL 95*  --  97* 101  --  98 92*  CO2 27  --  27 28  --  32 35*  GLUCOSE 129*  --  115* 179*  --  131* 204*  BUN 25*  --  22 24*  --  21 18  CREATININE 0.95  --  0.82 0.85  --  0.82 0.81  CALCIUM 8.9  --  8.1* 7.6*  --  8.4* 8.6*  MG  --  1.5*  --  1.4* 1.9  --  1.7  PHOS  --   --   --   --   --   --  3.3   Liver Function Tests: Recent Labs  Lab 01/03/21 1933 01/04/21 0533 01/05/21 0249 01/07/21 0403  AST 60* 39 31 18  ALT 70* 52* 51* 36  ALKPHOS 64 53 50 45  BILITOT 0.7 0.5 0.5 0.4  PROT 7.8 6.0* 5.4* 6.1*  ALBUMIN 3.0* 2.3* 2.1* 2.6*   No results for input(s): LIPASE, AMYLASE in the last 168 hours. No results for input(s): AMMONIA in the last 168 hours. CBC: Recent Labs  Lab 01/03/21 1933 01/04/21 0533 01/05/21 0249 01/07/21 0403 01/09/21 1428  WBC 16.1* 12.6* 11.4* 15.3* 22.6*  NEUTROABS 13.5*  --   --   --   --   HGB 10.6* 8.2* 8.4* 9.1*  10.3*  HCT 32.3* 25.1* 25.9* 28.3* 32.0*  MCV 91.2 91.3 91.8 92.2 92.5  PLT 201 147* 164 193 229   Cardiac Enzymes: No results for input(s): CKTOTAL, CKMB, CKMBINDEX, TROPONINI in the last 168 hours. BNP: BNP (last 3 results) Recent Labs    01/04/21 0533 01/09/21 1428  BNP 1,014.2* 896.1*    ProBNP (last 3 results) No results for input(s): PROBNP in the last 8760 hours.  CBG: Recent Labs  Lab 01/09/21 1216 01/09/21 1724 01/09/21 2028 01/10/21 0730 01/10/21 1218  GLUCAP 174* 182* 169* 116* 163*        Signed:  Kayleen Memos, MD [facility] 01/10/2021, 1:58 PM

## 2021-01-10 NOTE — Care Management Important Message (Incomplete)
Important Message  Patient Details  Name: Helen [name] MRN: [id number] Date of Birth: October 22, 1929   Medicare Important Message Given:  Yes     Kerin Salen 01/10/2021, 9:56 AM

## 2021-01-10 NOTE — Discharge Instructions (Signed)
Community-Acquired Pneumonia, Adult Pneumonia is an infection of the lungs. It causes irritation and swelling in the airways of the lungs. Mucus and fluid may also build up inside the airways. This may cause coughing and trouble breathing. One type of pneumonia can happen while you are in a hospital. A different type can happen when you are not in a hospital (community-acquired pneumonia). What are the causes? This condition is caused by germs (viruses, bacteria, or fungi). Some types of germs can spread from person to person. Pneumonia is not thought to spread from person to person.   What increases the risk? You are more likely to develop this condition if:  You have a long-term (chronic) disease, such as: ? Disease of the lungs. This may be chronic obstructive pulmonary disease (COPD) or asthma. ? Heart failure. ? Cystic fibrosis. ? Diabetes. ? Kidney disease. ? Sickle cell disease. ? HIV.  You have other health problems, such as: ? Your body's defense system (immune system) is weak. ? A condition that may cause you to breathe in fluids from your mouth and nose.  You had your spleen taken out.  You do not take good care of your teeth and mouth (poor dental hygiene).  You use or have used tobacco products.  You travel where the germs that cause this illness are common.  You are near certain animals or the places they live.  You are older than 85 years of age. What are the signs or symptoms? Symptoms of this condition include:  A cough.  A fever.  Sweating or chills.  Chest pain, often when you breathe deeply or cough.  Breathing problems, such as: ? Fast breathing. ? Trouble breathing. ? Shortness of breath.  Feeling tired (fatigued).  Muscle aches. How is this treated? Treatment for this condition depends on many things, such as:  The cause of your illness.  Your medicines.  Your other health problems. Most adults can be treated at home. Sometimes,  treatment must happen in a hospital.  Treatment may include medicines to kill germs.  Medicines may depend on which germ caused your illness. Very bad pneumonia is rare. If you get it, you may:  Have a machine to help you breathe.  Have fluid taken away from around your lungs. Follow these instructions at home: Medicines  Take over-the-counter and prescription medicines only as told by your doctor.  Take cough medicine only if you are losing sleep. Cough medicine can keep your body from taking mucus away from your lungs.  If you were prescribed an antibiotic medicine, take it as told by your doctor. Do not stop taking the antibiotic even if you start to feel better. Lifestyle  Do not drink alcohol.  Do not use any products that contain nicotine or tobacco, such as cigarettes, e-cigarettes, and chewing tobacco. If you need help quitting, ask your doctor.  Eat a healthy diet. This includes a lot of vegetables, fruits, whole grains, low-fat dairy products, and low-fat (lean) protein.      General instructions  Rest a lot. Sleep for at least 8 hours each night.  Sleep with your head and neck raised. Put a few pillows under your head or sleep in a reclining chair.  Return to your normal activities as told by your doctor. Ask your doctor what activities are safe for you.  Drink enough fluid to keep your pee (urine) pale yellow.  If your throat is sore, rinse your mouth often with salt water. To make salt   water, dissolve -1 tsp (3-6 g) of salt in 1 cup (237 mL) of warm water.  Keep all follow-up visits as told by your doctor. This is important.   How is this prevented? You can lower your risk of pneumonia by:  Getting the pneumonia shot (vaccine). These shots have different types and schedules. Ask your doctor what works best for you. Think about getting this shot if: ? You are older than 85 years of age. ? You are 75-7 years of age and:  You are being treated for  cancer.  You have long-term lung disease.  You have other problems that affect your body's defense system. Ask your doctor if you have one of these.  Getting your flu shot every year. Ask your doctor which type of shot is best for you.  Going to the dentist as often as told.  Washing your hands often with soap and water for at least 20 seconds. If you cannot use soap and water, use hand sanitizer. Contact a doctor if:  You have a fever.  You lose sleep because your cough medicine does not help. Get help right away if:  You are short of breath and this gets worse.  You have more chest pain.  Your sickness gets worse. This is very serious if: ? You are an older adult. ? Your body's defense system is weak.  You cough up blood. These symptoms may be an emergency. Do not wait to see if the symptoms will go away. Get medical help right away. Call your local emergency services (911 in the [place] Do not drive yourself to the hospital. Summary  Pneumonia is an infection of the lungs.  Community-acquired pneumonia affects people who have not been in the hospital. Certain germs can cause this infection.  This condition may be treated with medicines that kill germs.  For very bad pneumonia, you may need a hospital stay and treatment to help with breathing. This information is not intended to replace advice given to you by your health care provider. Make sure you discuss any questions you have with your health care provider. Document Revised: 05/23/2019 Document Reviewed: 05/23/2019 Elsevier Patient Education  2021 [place] vs. Isolation QUARANTINE keeps someone who was in close contact with someone who has COVID-19 away from others. Quarantine if you have been in close contact with someone who has COVID-19, unless you have been fully vaccinated. If you are fully vaccinated  You do NOT need to quarantine unless they have symptoms  Get tested 3-5 days after  your exposure, even if you don't have symptoms  Wear a mask indoors in public for 14 days following exposure or until your test result is negative If you are not fully vaccinated  Stay home for 14 days after your last contact with a person who has COVID-19  Watch for fever (100.23F), cough, shortness of breath, or other symptoms of COVID-19  If possible, stay away from people you live with, especially people who are at higher risk for getting very sick from COVID-19  Contact your local public health department for options in your area to possibly shorten your quarantine ISOLATION keeps someone who is sick or tested positive for COVID-19 without symptoms away from others, even in their own home. People who are in isolation should stay home and stay in a specific "sick room" or area and use a separate bathroom (if available). If you are sick and think or know you have COVID-19 Stay home until  after  At least 10 days since symptoms first appeared and  At least 24 hours with no fever without the use of fever-reducing medications and  Symptoms have improved If you tested positive for COVID-19 but do not have symptoms  Stay home until after 10 days have passed since your positive viral test  If you develop symptoms after testing positive, follow the steps above for those who are sick [email or web address] 05/20/2020 This information is not intended to replace advice given to you by your health care provider. Make sure you discuss any questions you have with your health care provider. Document Revised: 06/24/2020 Document Reviewed: 06/24/2020 Elsevier Patient Education  2021 [employer].

## 2021-01-20 DIAGNOSIS — M6281 Muscle weakness (generalized): Secondary | ICD-10-CM | POA: Diagnosis not present

## 2021-01-20 DIAGNOSIS — R2681 Unsteadiness on feet: Secondary | ICD-10-CM | POA: Diagnosis not present

## 2021-01-22 DIAGNOSIS — R2681 Unsteadiness on feet: Secondary | ICD-10-CM | POA: Diagnosis not present

## 2021-01-22 DIAGNOSIS — R488 Other symbolic dysfunctions: Secondary | ICD-10-CM | POA: Diagnosis not present

## 2021-01-22 DIAGNOSIS — R41841 Cognitive communication deficit: Secondary | ICD-10-CM | POA: Diagnosis not present

## 2021-01-22 DIAGNOSIS — M6281 Muscle weakness (generalized): Secondary | ICD-10-CM | POA: Diagnosis not present

## 2021-01-23 DIAGNOSIS — M6281 Muscle weakness (generalized): Secondary | ICD-10-CM | POA: Diagnosis not present

## 2021-01-23 DIAGNOSIS — R488 Other symbolic dysfunctions: Secondary | ICD-10-CM | POA: Diagnosis not present

## 2021-01-23 DIAGNOSIS — R41841 Cognitive communication deficit: Secondary | ICD-10-CM | POA: Diagnosis not present

## 2021-01-23 DIAGNOSIS — R2681 Unsteadiness on feet: Secondary | ICD-10-CM | POA: Diagnosis not present

## 2021-01-24 DIAGNOSIS — M6281 Muscle weakness (generalized): Secondary | ICD-10-CM | POA: Diagnosis not present

## 2021-01-24 DIAGNOSIS — R488 Other symbolic dysfunctions: Secondary | ICD-10-CM | POA: Diagnosis not present

## 2021-01-24 DIAGNOSIS — R41841 Cognitive communication deficit: Secondary | ICD-10-CM | POA: Diagnosis not present

## 2021-01-24 DIAGNOSIS — R2681 Unsteadiness on feet: Secondary | ICD-10-CM | POA: Diagnosis not present

## 2021-01-27 DIAGNOSIS — M6281 Muscle weakness (generalized): Secondary | ICD-10-CM | POA: Diagnosis not present

## 2021-01-27 DIAGNOSIS — R41841 Cognitive communication deficit: Secondary | ICD-10-CM | POA: Diagnosis not present

## 2021-01-27 DIAGNOSIS — R2681 Unsteadiness on feet: Secondary | ICD-10-CM | POA: Diagnosis not present

## 2021-01-27 DIAGNOSIS — R488 Other symbolic dysfunctions: Secondary | ICD-10-CM | POA: Diagnosis not present

## 2021-01-28 DIAGNOSIS — R488 Other symbolic dysfunctions: Secondary | ICD-10-CM | POA: Diagnosis not present

## 2021-01-28 DIAGNOSIS — R41841 Cognitive communication deficit: Secondary | ICD-10-CM | POA: Diagnosis not present

## 2021-01-28 DIAGNOSIS — R2681 Unsteadiness on feet: Secondary | ICD-10-CM | POA: Diagnosis not present

## 2021-01-28 DIAGNOSIS — M6281 Muscle weakness (generalized): Secondary | ICD-10-CM | POA: Diagnosis not present

## 2021-01-29 DIAGNOSIS — M6281 Muscle weakness (generalized): Secondary | ICD-10-CM | POA: Diagnosis not present

## 2021-01-29 DIAGNOSIS — R2681 Unsteadiness on feet: Secondary | ICD-10-CM | POA: Diagnosis not present

## 2021-01-29 DIAGNOSIS — R41841 Cognitive communication deficit: Secondary | ICD-10-CM | POA: Diagnosis not present

## 2021-01-29 DIAGNOSIS — R488 Other symbolic dysfunctions: Secondary | ICD-10-CM | POA: Diagnosis not present

## 2021-01-30 DIAGNOSIS — M6281 Muscle weakness (generalized): Secondary | ICD-10-CM | POA: Diagnosis not present

## 2021-01-30 DIAGNOSIS — R488 Other symbolic dysfunctions: Secondary | ICD-10-CM | POA: Diagnosis not present

## 2021-01-30 DIAGNOSIS — R2681 Unsteadiness on feet: Secondary | ICD-10-CM | POA: Diagnosis not present

## 2021-01-30 DIAGNOSIS — R41841 Cognitive communication deficit: Secondary | ICD-10-CM | POA: Diagnosis not present

## 2021-01-31 DIAGNOSIS — R41841 Cognitive communication deficit: Secondary | ICD-10-CM | POA: Diagnosis not present

## 2021-01-31 DIAGNOSIS — R488 Other symbolic dysfunctions: Secondary | ICD-10-CM | POA: Diagnosis not present

## 2021-01-31 DIAGNOSIS — R2681 Unsteadiness on feet: Secondary | ICD-10-CM | POA: Diagnosis not present

## 2021-01-31 DIAGNOSIS — M6281 Muscle weakness (generalized): Secondary | ICD-10-CM | POA: Diagnosis not present

## 2021-02-03 DIAGNOSIS — R2681 Unsteadiness on feet: Secondary | ICD-10-CM | POA: Diagnosis not present

## 2021-02-03 DIAGNOSIS — M6281 Muscle weakness (generalized): Secondary | ICD-10-CM | POA: Diagnosis not present

## 2021-02-03 DIAGNOSIS — R41841 Cognitive communication deficit: Secondary | ICD-10-CM | POA: Diagnosis not present

## 2021-02-03 DIAGNOSIS — R488 Other symbolic dysfunctions: Secondary | ICD-10-CM | POA: Diagnosis not present

## 2021-02-04 DIAGNOSIS — Z8673 Personal history of transient ischemic attack (TIA), and cerebral infarction without residual deficits: Secondary | ICD-10-CM | POA: Diagnosis not present

## 2021-02-04 DIAGNOSIS — I10 Essential (primary) hypertension: Secondary | ICD-10-CM | POA: Diagnosis not present

## 2021-02-04 DIAGNOSIS — U071 COVID-19: Secondary | ICD-10-CM | POA: Diagnosis not present

## 2021-02-04 DIAGNOSIS — J9621 Acute and chronic respiratory failure with hypoxia: Secondary | ICD-10-CM | POA: Diagnosis not present

## 2021-02-04 DIAGNOSIS — I5042 Chronic combined systolic (congestive) and diastolic (congestive) heart failure: Secondary | ICD-10-CM | POA: Diagnosis not present

## 2021-02-04 DIAGNOSIS — F5101 Primary insomnia: Secondary | ICD-10-CM | POA: Diagnosis not present

## 2021-02-04 DIAGNOSIS — D649 Anemia, unspecified: Secondary | ICD-10-CM | POA: Diagnosis not present

## 2021-02-04 DIAGNOSIS — Z853 Personal history of malignant neoplasm of breast: Secondary | ICD-10-CM | POA: Diagnosis not present

## 2021-02-04 DIAGNOSIS — N183 Chronic kidney disease, stage 3 unspecified: Secondary | ICD-10-CM | POA: Diagnosis not present

## 2021-02-04 DIAGNOSIS — E1122 Type 2 diabetes mellitus with diabetic chronic kidney disease: Secondary | ICD-10-CM | POA: Diagnosis not present

## 2021-02-04 DIAGNOSIS — E782 Mixed hyperlipidemia: Secondary | ICD-10-CM | POA: Diagnosis not present

## 2021-02-05 DIAGNOSIS — M6281 Muscle weakness (generalized): Secondary | ICD-10-CM | POA: Diagnosis not present

## 2021-02-05 DIAGNOSIS — R488 Other symbolic dysfunctions: Secondary | ICD-10-CM | POA: Diagnosis not present

## 2021-02-05 DIAGNOSIS — R2681 Unsteadiness on feet: Secondary | ICD-10-CM | POA: Diagnosis not present

## 2021-02-05 DIAGNOSIS — R41841 Cognitive communication deficit: Secondary | ICD-10-CM | POA: Diagnosis not present

## 2021-02-06 DIAGNOSIS — R2681 Unsteadiness on feet: Secondary | ICD-10-CM | POA: Diagnosis not present

## 2021-02-06 DIAGNOSIS — R41841 Cognitive communication deficit: Secondary | ICD-10-CM | POA: Diagnosis not present

## 2021-02-06 DIAGNOSIS — R488 Other symbolic dysfunctions: Secondary | ICD-10-CM | POA: Diagnosis not present

## 2021-02-06 DIAGNOSIS — M6281 Muscle weakness (generalized): Secondary | ICD-10-CM | POA: Diagnosis not present

## 2021-02-07 DIAGNOSIS — M6281 Muscle weakness (generalized): Secondary | ICD-10-CM | POA: Diagnosis not present

## 2021-02-07 DIAGNOSIS — R41841 Cognitive communication deficit: Secondary | ICD-10-CM | POA: Diagnosis not present

## 2021-02-07 DIAGNOSIS — R488 Other symbolic dysfunctions: Secondary | ICD-10-CM | POA: Diagnosis not present

## 2021-02-07 DIAGNOSIS — R2681 Unsteadiness on feet: Secondary | ICD-10-CM | POA: Diagnosis not present

## 2021-02-10 DIAGNOSIS — R41841 Cognitive communication deficit: Secondary | ICD-10-CM | POA: Diagnosis not present

## 2021-02-10 DIAGNOSIS — R488 Other symbolic dysfunctions: Secondary | ICD-10-CM | POA: Diagnosis not present

## 2021-02-10 DIAGNOSIS — R2681 Unsteadiness on feet: Secondary | ICD-10-CM | POA: Diagnosis not present

## 2021-02-10 DIAGNOSIS — M6281 Muscle weakness (generalized): Secondary | ICD-10-CM | POA: Diagnosis not present

## 2021-02-11 DIAGNOSIS — R41841 Cognitive communication deficit: Secondary | ICD-10-CM | POA: Diagnosis not present

## 2021-02-11 DIAGNOSIS — M6281 Muscle weakness (generalized): Secondary | ICD-10-CM | POA: Diagnosis not present

## 2021-02-11 DIAGNOSIS — R2681 Unsteadiness on feet: Secondary | ICD-10-CM | POA: Diagnosis not present

## 2021-02-11 DIAGNOSIS — R488 Other symbolic dysfunctions: Secondary | ICD-10-CM | POA: Diagnosis not present

## 2021-02-12 DIAGNOSIS — E782 Mixed hyperlipidemia: Secondary | ICD-10-CM | POA: Diagnosis not present

## 2021-02-12 DIAGNOSIS — I639 Cerebral infarction, unspecified: Secondary | ICD-10-CM | POA: Diagnosis not present

## 2021-02-12 DIAGNOSIS — N183 Chronic kidney disease, stage 3 unspecified: Secondary | ICD-10-CM | POA: Diagnosis not present

## 2021-02-12 DIAGNOSIS — I10 Essential (primary) hypertension: Secondary | ICD-10-CM | POA: Diagnosis not present

## 2021-02-12 DIAGNOSIS — R41841 Cognitive communication deficit: Secondary | ICD-10-CM | POA: Diagnosis not present

## 2021-02-12 DIAGNOSIS — D649 Anemia, unspecified: Secondary | ICD-10-CM | POA: Diagnosis not present

## 2021-02-12 DIAGNOSIS — I5042 Chronic combined systolic (congestive) and diastolic (congestive) heart failure: Secondary | ICD-10-CM | POA: Diagnosis not present

## 2021-02-12 DIAGNOSIS — E1122 Type 2 diabetes mellitus with diabetic chronic kidney disease: Secondary | ICD-10-CM | POA: Diagnosis not present

## 2021-02-12 DIAGNOSIS — R2681 Unsteadiness on feet: Secondary | ICD-10-CM | POA: Diagnosis not present

## 2021-02-12 DIAGNOSIS — G459 Transient cerebral ischemic attack, unspecified: Secondary | ICD-10-CM | POA: Diagnosis not present

## 2021-02-12 DIAGNOSIS — M6281 Muscle weakness (generalized): Secondary | ICD-10-CM | POA: Diagnosis not present

## 2021-02-12 DIAGNOSIS — R488 Other symbolic dysfunctions: Secondary | ICD-10-CM | POA: Diagnosis not present

## 2021-02-12 NOTE — Progress Notes (Signed)
Cardiology Office Note:    Date:  02/13/2021   ID:  [name], DOB 05-Aug-1930, MRN [id number]  PCP:  Vernie Shanks, MD Referring MD: Vernie Shanks, MD    [place]  Cardiologist:  Skeet Latch, MD   Reason for visit: 6 month follow-up   History of Present Illness:    Helen [name] is a 87 y.o. female with a hx of chronic systolic and diastolic heart failure, paroxysmal atrial fibrillation, hypertension, hyperlipidemia, diabetes, prior stroke, CAD, carotid stenosis and breast cancer s/p chemotherapy and double mastectomy who presents for follow up on heart failure.  To note, she was admitted to the hospital in May 2022 with COVID-19 and community-acquired pneumonia.  She was last seen by Dr. Oval Linsey in September 2021 and was noted to be euvolemic.  Carotid duplex was ordered showing moderate RICA and mild LICA disease.  Today, she comes to clinic alone.  She lives in a senior living facility that makes her meals and brings her mail.  She has 2 children out of 8 that live local.  She states she has recovered well from COVID-19 and CAP.  Her breathing has returned to baseline.  She has been on nighttime O2 for years but this hospitalization, they added daytime O2.  She is currently on 1 LPM  and she plans to talk to her PCP to see if she can d/c daytime O2.    She denies chest pain, syncope, orthopnea, PND or significant pedal edema.  She mentions lightheadedness with position changes that is worse since COVID-19/CAP.  She has been doing PT since her hospital discharge which she has enjoyed.  She denies DOE.  She denies bleeding issues with Eliquis.  She notes some bloating.  She denies constipation.  She states her pants fit the same.  She does not monitor her weights daily.  She wonders if lasix is doing as much as it used to.   Past Medical History:  Diagnosis Date   Anemia    Anxiety    Cancer ([place])    Breast cancer   Chronic combined  systolic (congestive) and diastolic (congestive) heart failure (HCC)    a. EF 25-30% by echo in 11/2016. Recent NST showing no inducible ischemia --> therefore thought to be most consistent with nonischemic cardiomyopathy.    Depression    Diabetes mellitus    History of stroke 11/2016   incidental finding on MRI   Hypercholesteremia    Hypertension    Peripheral vascular disease ([place]) 2015   bilateral moderate CA disease-Dr Bridgett Larsson   Stroke [facility])     Past Surgical History:  Procedure Laterality Date   ABDOMINAL HYSTERECTOMY     APPENDECTOMY  1951   BIOPSY  12/13/2019   Procedure: BIOPSY;  Surgeon: Wilford Corner, MD;  Location: [facility] END[facility]COPY;  Service: Endoscopy;;   [place]  2003   By Dr. Excell Seltzer   COLONOSCOPY WITH PROPOFOL N/A 12/13/2019   Procedure: COLONOSCOPY WITH PROPOFOL;  Surgeon: Wilford Corner, MD;  Location: [facility];  Service: Endoscopy;  Laterality: N/A;   ESOPHAGOGASTRODUODENOSCOPY N/A 12/13/2019   Procedure: ESOPHAGOGASTRODUODENOSCOPY (EGD);  Surgeon: Wilford Corner, MD;  Location: [facility];  Service: Endoscopy;  Laterality: N/A;   EYE SURGERY Bilateral 2009   Cataract surgery   FRACTURE SURGERY Right    ORIF   by Dr. Burney Gauze   HUMERUS FRACTURE SURGERY     MASTECTOMY Bilateral    NASAL  SEPTUM SURGERY  1977   Dr. Ernesto Rutherford   POLYPECTOMY  12/13/2019   Procedure: POLYPECTOMY;  Surgeon: Wilford Corner, MD;  Location: [facility];  Service: Endoscopy;;   TOENAIL EXCISION      Current Medications: Current Meds  Medication Sig   atorvastatin (LIPITOR) 80 MG tablet Take 80 mg by mouth at bedtime.   BIOTIN PO Take 1 tablet by mouth daily.   bismuth subsalicylate (PEPTO BISMOL) 262 MG chewable tablet Chew 524 mg by mouth as needed for indigestion or diarrhea or loose stools.   carvedilol (COREG) 25 MG tablet TAKE 1 TABLET BY MOUTH  TWICE DAILY WITH MEALS (Patient taking differently: Take 25 mg by mouth 2 (two)  times daily with a meal.)   cholecalciferol (VITAMIN D) 1000 units tablet Take 1,000 Units by mouth every evening.    ELIQUIS 5 MG TABS tablet TAKE 1 TABLET BY MOUTH  TWICE DAILY (Patient taking differently: Take 5 mg by mouth 2 (two) times daily.)   ENTRESTO 49-51 MG TAKE 1 TABLET BY MOUTH  TWICE DAILY (Patient taking differently: Take 1 tablet by mouth 2 (two) times daily.)   furosemide (LASIX) 20 MG tablet TAKE 1 TABLET BY MOUTH  DAILY   magnesium oxide (MAG-OX) 400 MG tablet 1 tablet by mouth twice a day for 1 week and then 1 daily (Patient taking differently: Take 400 mg by mouth daily.)   Multiple Vitamin (MULTIVITAMIN WITH MINERALS) TABS tablet Take 1 tablet by mouth daily.    pantoprazole (PROTONIX) 40 MG tablet Take 1 tablet (40 mg total) by mouth 2 (two) times daily before a meal. (Patient taking differently: Take 40 mg by mouth daily as needed (acid reflux).)   senna-docusate (SENOKOT-S) 8.6-50 MG tablet Take 2 tablets by mouth at bedtime as needed for mild constipation or moderate constipation.   vitamin C (ASCORBIC ACID) 500 MG tablet Take 500 mg by mouth daily.    [DISCONTINUED] linagliptin (TRADJENTA) 5 MG TABS tablet Take 1 tablet (5 mg total) by mouth daily.   [DISCONTINUED] loperamide (IMODIUM) 2 MG capsule Take 1 capsule (2 mg total) by mouth as needed for diarrhea or loose stools.     Allergies:   Morphine and related, Other, Sulfa antibiotics, Sulfasalazine, and Nickel   Social History   Socioeconomic History   Marital status: Soil scientist    Spouse name: Not on file   Number of children: Not on file   Years of education: Not on file   Highest education level: Not on file  Occupational History   Not on file  Tobacco Use   Smoking status: Former    Pack years: 0.00    Types: Cigarettes    Quit date: 08/24/1978    Years since quitting: 42.5   Smokeless tobacco: Never  Vaping Use   Vaping Use: Never used  Substance and Sexual Activity   Alcohol use: No   Drug  use: No   Sexual activity: Not Currently  Other Topics Concern   Not on file  Social History Narrative   Not on file   Social Determinants of Health   Financial Resource Strain: Not on file  Food Insecurity: Not on file  Transportation Needs: Not on file  Physical Activity: Not on file  Stress: Not on file  Social Connections: Not on file     Family History: The patient's family history includes Cancer in her mother; Diabetes in her brother; Heart attack in her father; Heart disease in her father; Hyperlipidemia in her  brother; Kidney disease in her brother; Pulmonary embolism in her mother; Stroke in her brother and father.  ROS:   Please see the history of present illness.     EKGs/Labs/Other Studies Reviewed:    Recent Labs: 01/07/2021: ALT 36 01/09/2021: B Natriuretic Peptide 896.1; BUN 18; Creatinine, Ser 0.81; Hemoglobin 10.3; Magnesium 1.7; Platelets 229; Potassium 3.6; Sodium 135  Recent Lipid Panel    Component Value Date/Time   CHOL 84 11/16/2016 0521   TRIG 91 11/16/2016 0521   HDL 18 (L) 11/16/2016 0521   CHOLHDL 4.7 11/16/2016 0521   VLDL 18 11/16/2016 0521   LDLCALC 48 11/16/2016 0521    Physical Exam:    VS:  BP (!) 137/59   Pulse 65   Ht 5\' 2"  (1.575 m)   Wt 143 lb (64.9 kg)   SpO2 100%   BMI 26.16 kg/m     Wt Readings from Last 3 Encounters:  02/13/21 143 lb (64.9 kg)  01/10/21 149 lb 11.1 oz (67.9 kg)  12/30/20 148 lb (67.1 kg)     GEN:  Well nourished, well developed in no acute distress, elderly, wearing O2 HEENT: Normal NECK: JVD to low neck at 90 degrees; + carotid bruits (R>L) CARDIAC: irreg irreg, II/VI systolic murmur, no rubs, no gallops RESPIRATORY:  Clear to auscultation without rales, wheezing or rhonchi  ABDOMEN: Soft, non-tender, slightly distended MUSCULOSKELETAL: No edema; No deformity  SKIN: Warm and dry NEUROLOGIC:  Alert and oriented PSYCHIATRIC:  Normal affect   ASSESSMENT AND PLAN    1. Chronic combined systolic  and diastolic heart failure ([place]), euvolemic - EF 25-30% by echo 11/2016 with diffuse hypokinesis, mild RV dysfunction and mild MR/TR --> Appears euvolemic on exam.  Without overt HF symptoms other than subjective bloating, continue lasix 20mg  daily.  Advised to monitor daily weights and call if gains >3 lbs in 1 day or >5 lbs in 1 week.  She avoids adding salt though she has meals through the facility.  --> With her increased lightheadedness and intermittent soft pressures, will hold off increasing Entresto or adding spiro.   -->  With heart murmur, will order repeat echo.  2. Bilateral carotid artery stenosis - Carotid duplex 04/2020 - moderate RICA and mild LICA stenosis --> No recent TIA/CVA symptoms.  Repeat duplex in 04/2021  3. Paroxysmal atrial fibrillation ([place]), rate controlled --> Continue Coreg & Eliquis.   Disposition: Follow-up with Dr. Oval Linsey or myself in 3 months following 2D echo and carotid duplex.  No med changes today.  Talk to PCP about d/c daytime O2.       Medication Adjustments/Labs and Tests Ordered: Current medicines are reviewed at length with the patient today.  Concerns regarding medicines are outlined above.  Orders Placed This Encounter  Procedures   ECHOCARDIOGRAM COMPLETE   No orders of the defined types were placed in this encounter.   Patient Instructions  Medication Instructions:  The current medical regimen is effective;  continue present plan and medications as directed. Please refer to the Current Medication list given to you today.  *If you need a refill on your cardiac medications before your next appointment, please call your pharmacy*  Lab Work: NONE  Testing/Procedures: Echocardiogram (IN 3 MONTHS) - Your physician has requested that you have an echocardiogram. Echocardiography is a painless test that uses sound waves to create images of your heart. It provides your doctor with information about the size and shape of your heart and how  well your heart's chambers  and valves are working. This procedure takes approximately one hour. There are no restrictions for this procedure. This will be performed at our [facility] location - [address].  Your physician has requested that you have a carotid duplex  (IN 3 MONTHS)  . This test is an ultrasound of the carotid arteries in your neck. It looks at blood flow through these arteries that supply the brain with blood. Allow one hour for this exam. There are no restrictions or special instructions.  Follow-Up: Your next appointment:  AFTER TESTING In Person with Skeet Latch, MD, IF UNAVAILABLE [place], FNP-C -[place], PA-C  Please call our office 2 months in advance to schedule this appointment   At [facility], you and your health needs are our priority.  As part of our continuing mission to provide you with exceptional heart care, we have created designated Provider Care Teams.  These Care Teams include your primary Cardiologist (physician) and Advanced Practice Providers (APPs -  Physician Assistants and Nurse Practitioners) who all work together to provide you with the care you need, when you need it.  We recommend signing up for the patient portal called "MyChart".  Sign up information is provided on this After Visit Summary.  MyChart is used to connect with patients for Virtual Visits (Telemedicine).  Patients are able to view lab/test results, encounter notes, upcoming appointments, etc.  Non-urgent messages can be sent to your provider as well.   To learn more about what you can do with MyChart, go to [email or web address].      Signed, Warren Lacy, PA-C  02/13/2021 12:59 PM    [facility]

## 2021-02-13 ENCOUNTER — Ambulatory Visit: Payer: Medicare Other | Admitting: Cardiovascular Disease

## 2021-02-13 ENCOUNTER — Ambulatory Visit (INDEPENDENT_AMBULATORY_CARE_PROVIDER_SITE_OTHER): Payer: Medicare Other | Admitting: Physician Assistant

## 2021-02-13 ENCOUNTER — Other Ambulatory Visit: Payer: Self-pay

## 2021-02-13 ENCOUNTER — Encounter: Payer: Self-pay | Admitting: General Practice

## 2021-02-13 VITALS — BP 137/59 | HR 65 | Ht 62.0 in | Wt 143.0 lb

## 2021-02-13 DIAGNOSIS — I5042 Chronic combined systolic (congestive) and diastolic (congestive) heart failure: Secondary | ICD-10-CM | POA: Diagnosis not present

## 2021-02-13 DIAGNOSIS — I480 Paroxysmal atrial fibrillation: Secondary | ICD-10-CM | POA: Diagnosis not present

## 2021-02-13 DIAGNOSIS — I6523 Occlusion and stenosis of bilateral carotid arteries: Secondary | ICD-10-CM | POA: Diagnosis not present

## 2021-02-13 NOTE — Patient Instructions (Signed)
Medication Instructions:  The current medical regimen is effective;  continue present plan and medications as directed. Please refer to the Current Medication list given to you today.  *If you need a refill on your cardiac medications before your next appointment, please call your pharmacy*  Lab Work: NONE  Testing/Procedures: Echocardiogram (IN 3 MONTHS) - Your physician has requested that you have an echocardiogram. Echocardiography is a painless test that uses sound waves to create images of your heart. It provides your doctor with information about the size and shape of your heart and how well your heart's chambers and valves are working. This procedure takes approximately one hour. There are no restrictions for this procedure. This will be performed at our [facility] location - [address], [address].  Your physician has requested that you have a carotid duplex  (IN 3 MONTHS)  . This test is an ultrasound of the carotid arteries in your neck. It looks at blood flow through these arteries that supply the brain with blood. Allow one hour for this exam. There are no restrictions or special instructions.  Follow-Up: Your next appointment:  AFTER TESTING In Person with Skeet Latch, MD, IF UNAVAILABLE [place], FNP-C -[place], PA-C  Please call our office 2 months in advance to schedule this appointment   At [facility], you and your health needs are our priority.  As part of our continuing mission to provide you with exceptional heart care, we have created designated Provider Care Teams.  These Care Teams include your primary Cardiologist (physician) and Advanced Practice Providers (APPs -  Physician Assistants and Nurse Practitioners) who all work together to provide you with the care you need, when you need it.  We recommend signing up for the patient portal called "MyChart".  Sign up information is provided on this After Visit Summary.  MyChart is used to connect  with patients for Virtual Visits (Telemedicine).  Patients are able to view lab/test results, encounter notes, upcoming appointments, etc.  Non-urgent messages can be sent to your provider as well.   To learn more about what you can do with MyChart, go to [email or web address].

## 2021-02-14 DIAGNOSIS — R2681 Unsteadiness on feet: Secondary | ICD-10-CM | POA: Diagnosis not present

## 2021-02-14 DIAGNOSIS — R41841 Cognitive communication deficit: Secondary | ICD-10-CM | POA: Diagnosis not present

## 2021-02-14 DIAGNOSIS — R488 Other symbolic dysfunctions: Secondary | ICD-10-CM | POA: Diagnosis not present

## 2021-02-14 DIAGNOSIS — M6281 Muscle weakness (generalized): Secondary | ICD-10-CM | POA: Diagnosis not present

## 2021-02-17 DIAGNOSIS — R2681 Unsteadiness on feet: Secondary | ICD-10-CM | POA: Diagnosis not present

## 2021-02-17 DIAGNOSIS — R41841 Cognitive communication deficit: Secondary | ICD-10-CM | POA: Diagnosis not present

## 2021-02-17 DIAGNOSIS — R488 Other symbolic dysfunctions: Secondary | ICD-10-CM | POA: Diagnosis not present

## 2021-02-17 DIAGNOSIS — M6281 Muscle weakness (generalized): Secondary | ICD-10-CM | POA: Diagnosis not present

## 2021-02-18 DIAGNOSIS — R41841 Cognitive communication deficit: Secondary | ICD-10-CM | POA: Diagnosis not present

## 2021-02-18 DIAGNOSIS — R2681 Unsteadiness on feet: Secondary | ICD-10-CM | POA: Diagnosis not present

## 2021-02-18 DIAGNOSIS — R488 Other symbolic dysfunctions: Secondary | ICD-10-CM | POA: Diagnosis not present

## 2021-02-18 DIAGNOSIS — M6281 Muscle weakness (generalized): Secondary | ICD-10-CM | POA: Diagnosis not present

## 2021-02-19 DIAGNOSIS — R41841 Cognitive communication deficit: Secondary | ICD-10-CM | POA: Diagnosis not present

## 2021-02-19 DIAGNOSIS — R488 Other symbolic dysfunctions: Secondary | ICD-10-CM | POA: Diagnosis not present

## 2021-02-19 DIAGNOSIS — M6281 Muscle weakness (generalized): Secondary | ICD-10-CM | POA: Diagnosis not present

## 2021-02-19 DIAGNOSIS — R2681 Unsteadiness on feet: Secondary | ICD-10-CM | POA: Diagnosis not present

## 2021-02-20 DIAGNOSIS — R2681 Unsteadiness on feet: Secondary | ICD-10-CM | POA: Diagnosis not present

## 2021-02-20 DIAGNOSIS — M6281 Muscle weakness (generalized): Secondary | ICD-10-CM | POA: Diagnosis not present

## 2021-02-20 DIAGNOSIS — R41841 Cognitive communication deficit: Secondary | ICD-10-CM | POA: Diagnosis not present

## 2021-02-20 DIAGNOSIS — R488 Other symbolic dysfunctions: Secondary | ICD-10-CM | POA: Diagnosis not present

## 2021-02-21 DIAGNOSIS — R2681 Unsteadiness on feet: Secondary | ICD-10-CM | POA: Diagnosis not present

## 2021-02-21 DIAGNOSIS — R488 Other symbolic dysfunctions: Secondary | ICD-10-CM | POA: Diagnosis not present

## 2021-02-21 DIAGNOSIS — R41841 Cognitive communication deficit: Secondary | ICD-10-CM | POA: Diagnosis not present

## 2021-02-21 DIAGNOSIS — M6281 Muscle weakness (generalized): Secondary | ICD-10-CM | POA: Diagnosis not present

## 2021-02-25 DIAGNOSIS — R488 Other symbolic dysfunctions: Secondary | ICD-10-CM | POA: Diagnosis not present

## 2021-02-25 DIAGNOSIS — R41841 Cognitive communication deficit: Secondary | ICD-10-CM | POA: Diagnosis not present

## 2021-02-25 DIAGNOSIS — R2681 Unsteadiness on feet: Secondary | ICD-10-CM | POA: Diagnosis not present

## 2021-02-25 DIAGNOSIS — M6281 Muscle weakness (generalized): Secondary | ICD-10-CM | POA: Diagnosis not present

## 2021-02-26 DIAGNOSIS — M6281 Muscle weakness (generalized): Secondary | ICD-10-CM | POA: Diagnosis not present

## 2021-02-26 DIAGNOSIS — R488 Other symbolic dysfunctions: Secondary | ICD-10-CM | POA: Diagnosis not present

## 2021-02-26 DIAGNOSIS — R2681 Unsteadiness on feet: Secondary | ICD-10-CM | POA: Diagnosis not present

## 2021-02-26 DIAGNOSIS — R41841 Cognitive communication deficit: Secondary | ICD-10-CM | POA: Diagnosis not present

## 2021-02-27 DIAGNOSIS — M6281 Muscle weakness (generalized): Secondary | ICD-10-CM | POA: Diagnosis not present

## 2021-02-27 DIAGNOSIS — R488 Other symbolic dysfunctions: Secondary | ICD-10-CM | POA: Diagnosis not present

## 2021-02-27 DIAGNOSIS — R2681 Unsteadiness on feet: Secondary | ICD-10-CM | POA: Diagnosis not present

## 2021-02-27 DIAGNOSIS — R41841 Cognitive communication deficit: Secondary | ICD-10-CM | POA: Diagnosis not present

## 2021-02-28 DIAGNOSIS — M6281 Muscle weakness (generalized): Secondary | ICD-10-CM | POA: Diagnosis not present

## 2021-02-28 DIAGNOSIS — R488 Other symbolic dysfunctions: Secondary | ICD-10-CM | POA: Diagnosis not present

## 2021-02-28 DIAGNOSIS — R41841 Cognitive communication deficit: Secondary | ICD-10-CM | POA: Diagnosis not present

## 2021-02-28 DIAGNOSIS — R2681 Unsteadiness on feet: Secondary | ICD-10-CM | POA: Diagnosis not present

## 2021-03-03 DIAGNOSIS — R2681 Unsteadiness on feet: Secondary | ICD-10-CM | POA: Diagnosis not present

## 2021-03-03 DIAGNOSIS — R488 Other symbolic dysfunctions: Secondary | ICD-10-CM | POA: Diagnosis not present

## 2021-03-03 DIAGNOSIS — R41841 Cognitive communication deficit: Secondary | ICD-10-CM | POA: Diagnosis not present

## 2021-03-03 DIAGNOSIS — M6281 Muscle weakness (generalized): Secondary | ICD-10-CM | POA: Diagnosis not present

## 2021-03-04 DIAGNOSIS — R2681 Unsteadiness on feet: Secondary | ICD-10-CM | POA: Diagnosis not present

## 2021-03-04 DIAGNOSIS — M6281 Muscle weakness (generalized): Secondary | ICD-10-CM | POA: Diagnosis not present

## 2021-03-04 DIAGNOSIS — R488 Other symbolic dysfunctions: Secondary | ICD-10-CM | POA: Diagnosis not present

## 2021-03-04 DIAGNOSIS — R41841 Cognitive communication deficit: Secondary | ICD-10-CM | POA: Diagnosis not present

## 2021-03-06 DIAGNOSIS — R488 Other symbolic dysfunctions: Secondary | ICD-10-CM | POA: Diagnosis not present

## 2021-03-06 DIAGNOSIS — R41841 Cognitive communication deficit: Secondary | ICD-10-CM | POA: Diagnosis not present

## 2021-03-06 DIAGNOSIS — M6281 Muscle weakness (generalized): Secondary | ICD-10-CM | POA: Diagnosis not present

## 2021-03-06 DIAGNOSIS — R2681 Unsteadiness on feet: Secondary | ICD-10-CM | POA: Diagnosis not present

## 2021-03-07 DIAGNOSIS — M6281 Muscle weakness (generalized): Secondary | ICD-10-CM | POA: Diagnosis not present

## 2021-03-07 DIAGNOSIS — R41841 Cognitive communication deficit: Secondary | ICD-10-CM | POA: Diagnosis not present

## 2021-03-07 DIAGNOSIS — R488 Other symbolic dysfunctions: Secondary | ICD-10-CM | POA: Diagnosis not present

## 2021-03-07 DIAGNOSIS — R2681 Unsteadiness on feet: Secondary | ICD-10-CM | POA: Diagnosis not present

## 2021-03-10 DIAGNOSIS — R488 Other symbolic dysfunctions: Secondary | ICD-10-CM | POA: Diagnosis not present

## 2021-03-10 DIAGNOSIS — R2681 Unsteadiness on feet: Secondary | ICD-10-CM | POA: Diagnosis not present

## 2021-03-10 DIAGNOSIS — R41841 Cognitive communication deficit: Secondary | ICD-10-CM | POA: Diagnosis not present

## 2021-03-10 DIAGNOSIS — M6281 Muscle weakness (generalized): Secondary | ICD-10-CM | POA: Diagnosis not present

## 2021-03-11 DIAGNOSIS — R41841 Cognitive communication deficit: Secondary | ICD-10-CM | POA: Diagnosis not present

## 2021-03-11 DIAGNOSIS — M6281 Muscle weakness (generalized): Secondary | ICD-10-CM | POA: Diagnosis not present

## 2021-03-11 DIAGNOSIS — R2681 Unsteadiness on feet: Secondary | ICD-10-CM | POA: Diagnosis not present

## 2021-03-11 DIAGNOSIS — R488 Other symbolic dysfunctions: Secondary | ICD-10-CM | POA: Diagnosis not present

## 2021-03-12 DIAGNOSIS — R488 Other symbolic dysfunctions: Secondary | ICD-10-CM | POA: Diagnosis not present

## 2021-03-12 DIAGNOSIS — R2681 Unsteadiness on feet: Secondary | ICD-10-CM | POA: Diagnosis not present

## 2021-03-12 DIAGNOSIS — M6281 Muscle weakness (generalized): Secondary | ICD-10-CM | POA: Diagnosis not present

## 2021-03-12 DIAGNOSIS — R41841 Cognitive communication deficit: Secondary | ICD-10-CM | POA: Diagnosis not present

## 2021-03-13 DIAGNOSIS — R488 Other symbolic dysfunctions: Secondary | ICD-10-CM | POA: Diagnosis not present

## 2021-03-13 DIAGNOSIS — R2681 Unsteadiness on feet: Secondary | ICD-10-CM | POA: Diagnosis not present

## 2021-03-13 DIAGNOSIS — M6281 Muscle weakness (generalized): Secondary | ICD-10-CM | POA: Diagnosis not present

## 2021-03-13 DIAGNOSIS — R41841 Cognitive communication deficit: Secondary | ICD-10-CM | POA: Diagnosis not present

## 2021-03-14 DIAGNOSIS — R488 Other symbolic dysfunctions: Secondary | ICD-10-CM | POA: Diagnosis not present

## 2021-03-14 DIAGNOSIS — R2681 Unsteadiness on feet: Secondary | ICD-10-CM | POA: Diagnosis not present

## 2021-03-14 DIAGNOSIS — R41841 Cognitive communication deficit: Secondary | ICD-10-CM | POA: Diagnosis not present

## 2021-03-14 DIAGNOSIS — M6281 Muscle weakness (generalized): Secondary | ICD-10-CM | POA: Diagnosis not present

## 2021-03-17 DIAGNOSIS — M6281 Muscle weakness (generalized): Secondary | ICD-10-CM | POA: Diagnosis not present

## 2021-03-17 DIAGNOSIS — R41841 Cognitive communication deficit: Secondary | ICD-10-CM | POA: Diagnosis not present

## 2021-03-17 DIAGNOSIS — R488 Other symbolic dysfunctions: Secondary | ICD-10-CM | POA: Diagnosis not present

## 2021-03-17 DIAGNOSIS — R2681 Unsteadiness on feet: Secondary | ICD-10-CM | POA: Diagnosis not present

## 2021-03-18 DIAGNOSIS — R488 Other symbolic dysfunctions: Secondary | ICD-10-CM | POA: Diagnosis not present

## 2021-03-18 DIAGNOSIS — R41841 Cognitive communication deficit: Secondary | ICD-10-CM | POA: Diagnosis not present

## 2021-03-18 DIAGNOSIS — M6281 Muscle weakness (generalized): Secondary | ICD-10-CM | POA: Diagnosis not present

## 2021-03-18 DIAGNOSIS — R2681 Unsteadiness on feet: Secondary | ICD-10-CM | POA: Diagnosis not present

## 2021-03-19 DIAGNOSIS — R41841 Cognitive communication deficit: Secondary | ICD-10-CM | POA: Diagnosis not present

## 2021-03-19 DIAGNOSIS — R2681 Unsteadiness on feet: Secondary | ICD-10-CM | POA: Diagnosis not present

## 2021-03-19 DIAGNOSIS — R488 Other symbolic dysfunctions: Secondary | ICD-10-CM | POA: Diagnosis not present

## 2021-03-19 DIAGNOSIS — M6281 Muscle weakness (generalized): Secondary | ICD-10-CM | POA: Diagnosis not present

## 2021-03-20 DIAGNOSIS — Z853 Personal history of malignant neoplasm of breast: Secondary | ICD-10-CM | POA: Diagnosis not present

## 2021-03-20 DIAGNOSIS — R488 Other symbolic dysfunctions: Secondary | ICD-10-CM | POA: Diagnosis not present

## 2021-03-20 DIAGNOSIS — M6281 Muscle weakness (generalized): Secondary | ICD-10-CM | POA: Diagnosis not present

## 2021-03-20 DIAGNOSIS — E1122 Type 2 diabetes mellitus with diabetic chronic kidney disease: Secondary | ICD-10-CM | POA: Diagnosis not present

## 2021-03-20 DIAGNOSIS — I10 Essential (primary) hypertension: Secondary | ICD-10-CM | POA: Diagnosis not present

## 2021-03-20 DIAGNOSIS — D649 Anemia, unspecified: Secondary | ICD-10-CM | POA: Diagnosis not present

## 2021-03-20 DIAGNOSIS — N183 Chronic kidney disease, stage 3 unspecified: Secondary | ICD-10-CM | POA: Diagnosis not present

## 2021-03-20 DIAGNOSIS — R2681 Unsteadiness on feet: Secondary | ICD-10-CM | POA: Diagnosis not present

## 2021-03-20 DIAGNOSIS — E782 Mixed hyperlipidemia: Secondary | ICD-10-CM | POA: Diagnosis not present

## 2021-03-20 DIAGNOSIS — F5101 Primary insomnia: Secondary | ICD-10-CM | POA: Diagnosis not present

## 2021-03-20 DIAGNOSIS — U071 COVID-19: Secondary | ICD-10-CM | POA: Diagnosis not present

## 2021-03-20 DIAGNOSIS — R41841 Cognitive communication deficit: Secondary | ICD-10-CM | POA: Diagnosis not present

## 2021-03-20 DIAGNOSIS — Z8673 Personal history of transient ischemic attack (TIA), and cerebral infarction without residual deficits: Secondary | ICD-10-CM | POA: Diagnosis not present

## 2021-03-20 DIAGNOSIS — I5042 Chronic combined systolic (congestive) and diastolic (congestive) heart failure: Secondary | ICD-10-CM | POA: Diagnosis not present

## 2021-03-20 DIAGNOSIS — J9621 Acute and chronic respiratory failure with hypoxia: Secondary | ICD-10-CM | POA: Diagnosis not present

## 2021-03-21 DIAGNOSIS — R2681 Unsteadiness on feet: Secondary | ICD-10-CM | POA: Diagnosis not present

## 2021-03-21 DIAGNOSIS — M6281 Muscle weakness (generalized): Secondary | ICD-10-CM | POA: Diagnosis not present

## 2021-03-21 DIAGNOSIS — R488 Other symbolic dysfunctions: Secondary | ICD-10-CM | POA: Diagnosis not present

## 2021-03-21 DIAGNOSIS — R41841 Cognitive communication deficit: Secondary | ICD-10-CM | POA: Diagnosis not present

## 2021-03-24 DIAGNOSIS — R2681 Unsteadiness on feet: Secondary | ICD-10-CM | POA: Diagnosis not present

## 2021-03-24 DIAGNOSIS — R488 Other symbolic dysfunctions: Secondary | ICD-10-CM | POA: Diagnosis not present

## 2021-03-24 DIAGNOSIS — M6281 Muscle weakness (generalized): Secondary | ICD-10-CM | POA: Diagnosis not present

## 2021-03-24 DIAGNOSIS — R41841 Cognitive communication deficit: Secondary | ICD-10-CM | POA: Diagnosis not present

## 2021-03-25 DIAGNOSIS — R2681 Unsteadiness on feet: Secondary | ICD-10-CM | POA: Diagnosis not present

## 2021-03-25 DIAGNOSIS — R41841 Cognitive communication deficit: Secondary | ICD-10-CM | POA: Diagnosis not present

## 2021-03-25 DIAGNOSIS — M6281 Muscle weakness (generalized): Secondary | ICD-10-CM | POA: Diagnosis not present

## 2021-03-25 DIAGNOSIS — R488 Other symbolic dysfunctions: Secondary | ICD-10-CM | POA: Diagnosis not present

## 2021-03-26 DIAGNOSIS — M6281 Muscle weakness (generalized): Secondary | ICD-10-CM | POA: Diagnosis not present

## 2021-03-26 DIAGNOSIS — R41841 Cognitive communication deficit: Secondary | ICD-10-CM | POA: Diagnosis not present

## 2021-03-26 DIAGNOSIS — R488 Other symbolic dysfunctions: Secondary | ICD-10-CM | POA: Diagnosis not present

## 2021-03-26 DIAGNOSIS — R2681 Unsteadiness on feet: Secondary | ICD-10-CM | POA: Diagnosis not present

## 2021-03-27 ENCOUNTER — Other Ambulatory Visit: Payer: Self-pay | Admitting: Cardiovascular Disease

## 2021-03-27 DIAGNOSIS — R2681 Unsteadiness on feet: Secondary | ICD-10-CM | POA: Diagnosis not present

## 2021-03-27 DIAGNOSIS — R41841 Cognitive communication deficit: Secondary | ICD-10-CM | POA: Diagnosis not present

## 2021-03-27 DIAGNOSIS — R488 Other symbolic dysfunctions: Secondary | ICD-10-CM | POA: Diagnosis not present

## 2021-03-27 DIAGNOSIS — M6281 Muscle weakness (generalized): Secondary | ICD-10-CM | POA: Diagnosis not present

## 2021-03-28 DIAGNOSIS — R2681 Unsteadiness on feet: Secondary | ICD-10-CM | POA: Diagnosis not present

## 2021-03-28 DIAGNOSIS — R41841 Cognitive communication deficit: Secondary | ICD-10-CM | POA: Diagnosis not present

## 2021-03-28 DIAGNOSIS — R488 Other symbolic dysfunctions: Secondary | ICD-10-CM | POA: Diagnosis not present

## 2021-03-28 DIAGNOSIS — M6281 Muscle weakness (generalized): Secondary | ICD-10-CM | POA: Diagnosis not present

## 2021-03-28 NOTE — Telephone Encounter (Signed)
Rx(s) sent to pharmacy electronically.  

## 2021-03-31 DIAGNOSIS — M6281 Muscle weakness (generalized): Secondary | ICD-10-CM | POA: Diagnosis not present

## 2021-03-31 DIAGNOSIS — R2681 Unsteadiness on feet: Secondary | ICD-10-CM | POA: Diagnosis not present

## 2021-03-31 DIAGNOSIS — R41841 Cognitive communication deficit: Secondary | ICD-10-CM | POA: Diagnosis not present

## 2021-03-31 DIAGNOSIS — R488 Other symbolic dysfunctions: Secondary | ICD-10-CM | POA: Diagnosis not present

## 2021-04-01 DIAGNOSIS — M6281 Muscle weakness (generalized): Secondary | ICD-10-CM | POA: Diagnosis not present

## 2021-04-01 DIAGNOSIS — R41841 Cognitive communication deficit: Secondary | ICD-10-CM | POA: Diagnosis not present

## 2021-04-01 DIAGNOSIS — R2681 Unsteadiness on feet: Secondary | ICD-10-CM | POA: Diagnosis not present

## 2021-04-01 DIAGNOSIS — R488 Other symbolic dysfunctions: Secondary | ICD-10-CM | POA: Diagnosis not present

## 2021-04-02 DIAGNOSIS — D649 Anemia, unspecified: Secondary | ICD-10-CM | POA: Diagnosis not present

## 2021-04-02 DIAGNOSIS — E1122 Type 2 diabetes mellitus with diabetic chronic kidney disease: Secondary | ICD-10-CM | POA: Diagnosis not present

## 2021-04-02 DIAGNOSIS — R488 Other symbolic dysfunctions: Secondary | ICD-10-CM | POA: Diagnosis not present

## 2021-04-02 DIAGNOSIS — M6281 Muscle weakness (generalized): Secondary | ICD-10-CM | POA: Diagnosis not present

## 2021-04-02 DIAGNOSIS — R2681 Unsteadiness on feet: Secondary | ICD-10-CM | POA: Diagnosis not present

## 2021-04-02 DIAGNOSIS — I10 Essential (primary) hypertension: Secondary | ICD-10-CM | POA: Diagnosis not present

## 2021-04-02 DIAGNOSIS — I5042 Chronic combined systolic (congestive) and diastolic (congestive) heart failure: Secondary | ICD-10-CM | POA: Diagnosis not present

## 2021-04-02 DIAGNOSIS — R41841 Cognitive communication deficit: Secondary | ICD-10-CM | POA: Diagnosis not present

## 2021-04-02 DIAGNOSIS — E782 Mixed hyperlipidemia: Secondary | ICD-10-CM | POA: Diagnosis not present

## 2021-04-02 DIAGNOSIS — I639 Cerebral infarction, unspecified: Secondary | ICD-10-CM | POA: Diagnosis not present

## 2021-04-02 DIAGNOSIS — G459 Transient cerebral ischemic attack, unspecified: Secondary | ICD-10-CM | POA: Diagnosis not present

## 2021-04-02 DIAGNOSIS — N183 Chronic kidney disease, stage 3 unspecified: Secondary | ICD-10-CM | POA: Diagnosis not present

## 2021-04-02 DIAGNOSIS — E119 Type 2 diabetes mellitus without complications: Secondary | ICD-10-CM | POA: Diagnosis not present

## 2021-04-03 DIAGNOSIS — R2681 Unsteadiness on feet: Secondary | ICD-10-CM | POA: Diagnosis not present

## 2021-04-03 DIAGNOSIS — M6281 Muscle weakness (generalized): Secondary | ICD-10-CM | POA: Diagnosis not present

## 2021-04-03 DIAGNOSIS — R41841 Cognitive communication deficit: Secondary | ICD-10-CM | POA: Diagnosis not present

## 2021-04-03 DIAGNOSIS — R488 Other symbolic dysfunctions: Secondary | ICD-10-CM | POA: Diagnosis not present

## 2021-04-07 DIAGNOSIS — R2681 Unsteadiness on feet: Secondary | ICD-10-CM | POA: Diagnosis not present

## 2021-04-07 DIAGNOSIS — R41841 Cognitive communication deficit: Secondary | ICD-10-CM | POA: Diagnosis not present

## 2021-04-07 DIAGNOSIS — M6281 Muscle weakness (generalized): Secondary | ICD-10-CM | POA: Diagnosis not present

## 2021-04-07 DIAGNOSIS — R488 Other symbolic dysfunctions: Secondary | ICD-10-CM | POA: Diagnosis not present

## 2021-04-08 DIAGNOSIS — M6281 Muscle weakness (generalized): Secondary | ICD-10-CM | POA: Diagnosis not present

## 2021-04-08 DIAGNOSIS — R488 Other symbolic dysfunctions: Secondary | ICD-10-CM | POA: Diagnosis not present

## 2021-04-08 DIAGNOSIS — R2681 Unsteadiness on feet: Secondary | ICD-10-CM | POA: Diagnosis not present

## 2021-04-08 DIAGNOSIS — R41841 Cognitive communication deficit: Secondary | ICD-10-CM | POA: Diagnosis not present

## 2021-04-09 DIAGNOSIS — R41841 Cognitive communication deficit: Secondary | ICD-10-CM | POA: Diagnosis not present

## 2021-04-09 DIAGNOSIS — R488 Other symbolic dysfunctions: Secondary | ICD-10-CM | POA: Diagnosis not present

## 2021-04-09 DIAGNOSIS — R2681 Unsteadiness on feet: Secondary | ICD-10-CM | POA: Diagnosis not present

## 2021-04-09 DIAGNOSIS — M6281 Muscle weakness (generalized): Secondary | ICD-10-CM | POA: Diagnosis not present

## 2021-04-10 DIAGNOSIS — R2681 Unsteadiness on feet: Secondary | ICD-10-CM | POA: Diagnosis not present

## 2021-04-10 DIAGNOSIS — R488 Other symbolic dysfunctions: Secondary | ICD-10-CM | POA: Diagnosis not present

## 2021-04-10 DIAGNOSIS — M6281 Muscle weakness (generalized): Secondary | ICD-10-CM | POA: Diagnosis not present

## 2021-04-10 DIAGNOSIS — R41841 Cognitive communication deficit: Secondary | ICD-10-CM | POA: Diagnosis not present

## 2021-04-11 DIAGNOSIS — R2681 Unsteadiness on feet: Secondary | ICD-10-CM | POA: Diagnosis not present

## 2021-04-11 DIAGNOSIS — R41841 Cognitive communication deficit: Secondary | ICD-10-CM | POA: Diagnosis not present

## 2021-04-11 DIAGNOSIS — R488 Other symbolic dysfunctions: Secondary | ICD-10-CM | POA: Diagnosis not present

## 2021-04-11 DIAGNOSIS — M6281 Muscle weakness (generalized): Secondary | ICD-10-CM | POA: Diagnosis not present

## 2021-04-14 DIAGNOSIS — R2681 Unsteadiness on feet: Secondary | ICD-10-CM | POA: Diagnosis not present

## 2021-04-14 DIAGNOSIS — M6281 Muscle weakness (generalized): Secondary | ICD-10-CM | POA: Diagnosis not present

## 2021-04-14 DIAGNOSIS — R488 Other symbolic dysfunctions: Secondary | ICD-10-CM | POA: Diagnosis not present

## 2021-04-14 DIAGNOSIS — R41841 Cognitive communication deficit: Secondary | ICD-10-CM | POA: Diagnosis not present

## 2021-04-15 DIAGNOSIS — R488 Other symbolic dysfunctions: Secondary | ICD-10-CM | POA: Diagnosis not present

## 2021-04-15 DIAGNOSIS — M6281 Muscle weakness (generalized): Secondary | ICD-10-CM | POA: Diagnosis not present

## 2021-04-15 DIAGNOSIS — R2681 Unsteadiness on feet: Secondary | ICD-10-CM | POA: Diagnosis not present

## 2021-04-15 DIAGNOSIS — R41841 Cognitive communication deficit: Secondary | ICD-10-CM | POA: Diagnosis not present

## 2021-04-17 DIAGNOSIS — M6281 Muscle weakness (generalized): Secondary | ICD-10-CM | POA: Diagnosis not present

## 2021-04-17 DIAGNOSIS — R41841 Cognitive communication deficit: Secondary | ICD-10-CM | POA: Diagnosis not present

## 2021-04-17 DIAGNOSIS — R2681 Unsteadiness on feet: Secondary | ICD-10-CM | POA: Diagnosis not present

## 2021-04-17 DIAGNOSIS — R488 Other symbolic dysfunctions: Secondary | ICD-10-CM | POA: Diagnosis not present

## 2021-04-18 DIAGNOSIS — R2681 Unsteadiness on feet: Secondary | ICD-10-CM | POA: Diagnosis not present

## 2021-04-18 DIAGNOSIS — M6281 Muscle weakness (generalized): Secondary | ICD-10-CM | POA: Diagnosis not present

## 2021-04-18 DIAGNOSIS — R41841 Cognitive communication deficit: Secondary | ICD-10-CM | POA: Diagnosis not present

## 2021-04-18 DIAGNOSIS — R488 Other symbolic dysfunctions: Secondary | ICD-10-CM | POA: Diagnosis not present

## 2021-04-21 DIAGNOSIS — R488 Other symbolic dysfunctions: Secondary | ICD-10-CM | POA: Diagnosis not present

## 2021-04-21 DIAGNOSIS — M6281 Muscle weakness (generalized): Secondary | ICD-10-CM | POA: Diagnosis not present

## 2021-04-21 DIAGNOSIS — R2681 Unsteadiness on feet: Secondary | ICD-10-CM | POA: Diagnosis not present

## 2021-04-21 DIAGNOSIS — R41841 Cognitive communication deficit: Secondary | ICD-10-CM | POA: Diagnosis not present

## 2021-04-22 DIAGNOSIS — R488 Other symbolic dysfunctions: Secondary | ICD-10-CM | POA: Diagnosis not present

## 2021-04-22 DIAGNOSIS — M6281 Muscle weakness (generalized): Secondary | ICD-10-CM | POA: Diagnosis not present

## 2021-04-22 DIAGNOSIS — R41841 Cognitive communication deficit: Secondary | ICD-10-CM | POA: Diagnosis not present

## 2021-04-22 DIAGNOSIS — R2681 Unsteadiness on feet: Secondary | ICD-10-CM | POA: Diagnosis not present

## 2021-04-23 DIAGNOSIS — R41841 Cognitive communication deficit: Secondary | ICD-10-CM | POA: Diagnosis not present

## 2021-04-23 DIAGNOSIS — R488 Other symbolic dysfunctions: Secondary | ICD-10-CM | POA: Diagnosis not present

## 2021-04-23 DIAGNOSIS — M6281 Muscle weakness (generalized): Secondary | ICD-10-CM | POA: Diagnosis not present

## 2021-04-23 DIAGNOSIS — R2681 Unsteadiness on feet: Secondary | ICD-10-CM | POA: Diagnosis not present

## 2021-04-24 DIAGNOSIS — R488 Other symbolic dysfunctions: Secondary | ICD-10-CM | POA: Diagnosis not present

## 2021-04-24 DIAGNOSIS — M6281 Muscle weakness (generalized): Secondary | ICD-10-CM | POA: Diagnosis not present

## 2021-04-24 DIAGNOSIS — R41841 Cognitive communication deficit: Secondary | ICD-10-CM | POA: Diagnosis not present

## 2021-04-24 DIAGNOSIS — R2681 Unsteadiness on feet: Secondary | ICD-10-CM | POA: Diagnosis not present

## 2021-04-25 DIAGNOSIS — R488 Other symbolic dysfunctions: Secondary | ICD-10-CM | POA: Diagnosis not present

## 2021-04-25 DIAGNOSIS — R41841 Cognitive communication deficit: Secondary | ICD-10-CM | POA: Diagnosis not present

## 2021-04-25 DIAGNOSIS — R2681 Unsteadiness on feet: Secondary | ICD-10-CM | POA: Diagnosis not present

## 2021-04-25 DIAGNOSIS — M6281 Muscle weakness (generalized): Secondary | ICD-10-CM | POA: Diagnosis not present

## 2021-04-29 DIAGNOSIS — R2681 Unsteadiness on feet: Secondary | ICD-10-CM | POA: Diagnosis not present

## 2021-04-29 DIAGNOSIS — R41841 Cognitive communication deficit: Secondary | ICD-10-CM | POA: Diagnosis not present

## 2021-04-29 DIAGNOSIS — M6281 Muscle weakness (generalized): Secondary | ICD-10-CM | POA: Diagnosis not present

## 2021-04-29 DIAGNOSIS — R488 Other symbolic dysfunctions: Secondary | ICD-10-CM | POA: Diagnosis not present

## 2021-05-01 DIAGNOSIS — R41841 Cognitive communication deficit: Secondary | ICD-10-CM | POA: Diagnosis not present

## 2021-05-01 DIAGNOSIS — R488 Other symbolic dysfunctions: Secondary | ICD-10-CM | POA: Diagnosis not present

## 2021-05-01 DIAGNOSIS — M6281 Muscle weakness (generalized): Secondary | ICD-10-CM | POA: Diagnosis not present

## 2021-05-01 DIAGNOSIS — R2681 Unsteadiness on feet: Secondary | ICD-10-CM | POA: Diagnosis not present

## 2021-05-02 DIAGNOSIS — R2681 Unsteadiness on feet: Secondary | ICD-10-CM | POA: Diagnosis not present

## 2021-05-02 DIAGNOSIS — R488 Other symbolic dysfunctions: Secondary | ICD-10-CM | POA: Diagnosis not present

## 2021-05-02 DIAGNOSIS — M6281 Muscle weakness (generalized): Secondary | ICD-10-CM | POA: Diagnosis not present

## 2021-05-02 DIAGNOSIS — R41841 Cognitive communication deficit: Secondary | ICD-10-CM | POA: Diagnosis not present

## 2021-05-06 DIAGNOSIS — R41841 Cognitive communication deficit: Secondary | ICD-10-CM | POA: Diagnosis not present

## 2021-05-06 DIAGNOSIS — R2681 Unsteadiness on feet: Secondary | ICD-10-CM | POA: Diagnosis not present

## 2021-05-06 DIAGNOSIS — M6281 Muscle weakness (generalized): Secondary | ICD-10-CM | POA: Diagnosis not present

## 2021-05-06 DIAGNOSIS — R488 Other symbolic dysfunctions: Secondary | ICD-10-CM | POA: Diagnosis not present

## 2021-05-08 DIAGNOSIS — R41841 Cognitive communication deficit: Secondary | ICD-10-CM | POA: Diagnosis not present

## 2021-05-08 DIAGNOSIS — R2681 Unsteadiness on feet: Secondary | ICD-10-CM | POA: Diagnosis not present

## 2021-05-08 DIAGNOSIS — R488 Other symbolic dysfunctions: Secondary | ICD-10-CM | POA: Diagnosis not present

## 2021-05-08 DIAGNOSIS — M6281 Muscle weakness (generalized): Secondary | ICD-10-CM | POA: Diagnosis not present

## 2021-05-09 ENCOUNTER — Telehealth (HOSPITAL_COMMUNITY): Payer: Self-pay | Admitting: Physician Assistant

## 2021-05-09 NOTE — Telephone Encounter (Signed)
Patient called and cancelled echocardiogram for the reason below:  05/09/2021 11:13 AM PH:[id number], Helen [name]  Cancel Rsn: Patient (death in family  Order will be removed from the Active Echo Wq and when patient calls back to reschedule we will reinstate the order. Thank you

## 2021-05-12 ENCOUNTER — Other Ambulatory Visit (HOSPITAL_COMMUNITY): Payer: Medicare Other

## 2021-05-12 ENCOUNTER — Other Ambulatory Visit (HOSPITAL_COMMUNITY): Payer: Self-pay | Admitting: Internal Medicine

## 2021-05-12 ENCOUNTER — Other Ambulatory Visit (HOSPITAL_BASED_OUTPATIENT_CLINIC_OR_DEPARTMENT_OTHER): Payer: Self-pay | Admitting: Physician Assistant

## 2021-05-12 DIAGNOSIS — I5042 Chronic combined systolic (congestive) and diastolic (congestive) heart failure: Secondary | ICD-10-CM

## 2021-05-13 ENCOUNTER — Other Ambulatory Visit: Payer: Self-pay

## 2021-05-13 ENCOUNTER — Ambulatory Visit (INDEPENDENT_AMBULATORY_CARE_PROVIDER_SITE_OTHER): Payer: Medicare Other

## 2021-05-13 DIAGNOSIS — R2681 Unsteadiness on feet: Secondary | ICD-10-CM | POA: Diagnosis not present

## 2021-05-13 DIAGNOSIS — I5042 Chronic combined systolic (congestive) and diastolic (congestive) heart failure: Secondary | ICD-10-CM | POA: Diagnosis not present

## 2021-05-13 DIAGNOSIS — R488 Other symbolic dysfunctions: Secondary | ICD-10-CM | POA: Diagnosis not present

## 2021-05-13 DIAGNOSIS — R41841 Cognitive communication deficit: Secondary | ICD-10-CM | POA: Diagnosis not present

## 2021-05-13 DIAGNOSIS — M6281 Muscle weakness (generalized): Secondary | ICD-10-CM | POA: Diagnosis not present

## 2021-05-14 DIAGNOSIS — M6281 Muscle weakness (generalized): Secondary | ICD-10-CM | POA: Diagnosis not present

## 2021-05-14 DIAGNOSIS — R488 Other symbolic dysfunctions: Secondary | ICD-10-CM | POA: Diagnosis not present

## 2021-05-14 DIAGNOSIS — R41841 Cognitive communication deficit: Secondary | ICD-10-CM | POA: Diagnosis not present

## 2021-05-14 DIAGNOSIS — R2681 Unsteadiness on feet: Secondary | ICD-10-CM | POA: Diagnosis not present

## 2021-05-14 LAB — ECHOCARDIOGRAM COMPLETE
AR max vel: 0.97 cm2
AV Area VTI: 1 cm2
AV Area mean vel: 0.98 cm2
AV Mean grad: 9 mmHg
AV Peak grad: 16.8 mmHg
Ao pk vel: 2.05 m/s
Area-P 1/2: 2.73 cm2
Calc EF: 46 %
S' Lateral: 2.16 cm
Single Plane A2C EF: 52 %
Single Plane A4C EF: 36.6 %

## 2021-05-16 ENCOUNTER — Telehealth: Payer: Self-pay

## 2021-05-16 ENCOUNTER — Other Ambulatory Visit: Payer: Self-pay

## 2021-05-16 ENCOUNTER — Ambulatory Visit (HOSPITAL_COMMUNITY)
Admission: RE | Admit: 2021-05-16 | Discharge: 2021-05-16 | Disposition: A | Discharge status: Home / Self Care | Payer: Medicare Other | Source: Ambulatory Visit | Attending: Cardiovascular Disease | Admitting: Cardiovascular Disease

## 2021-05-16 DIAGNOSIS — I6523 Occlusion and stenosis of bilateral carotid arteries: Secondary | ICD-10-CM | POA: Diagnosis not present

## 2021-05-16 NOTE — Telephone Encounter (Addendum)
Called patient regarding results. Unable to leave message----- Message from Warren Lacy, PA-C sent at 05/14/2021  2:15 PM EDT ----- Doristine Devoid news!  Your heart squeeze has much improved since 2018.  Your ejection fraction was 25 to 30% in 2018 now it is 50 to 55%.  Your heart has recovered nicely.   I had ordered this echo because I heard a heart murmur.  This is likely due to calcium seen on the aortic valve.  The good thing is there is no severe constriction (stenosis) or regurgitation of the valve.

## 2021-05-17 NOTE — Progress Notes (Signed)
Cardiology Clinic Note   Patient Name: Helen [name] Date of Encounter: 05/19/2021  Primary Care Provider:  Vernie Shanks, MD Primary Cardiologist:  Skeet Latch, MD  Patient Profile    Helen [name] presents to the clinic today for follow-up evaluation of her chronic combined systolic and diastolic CHF.  Past Medical History    Past Medical History:  Diagnosis Date   Anemia    Anxiety    Cancer ([place])    Breast cancer   Chronic combined systolic (congestive) and diastolic (congestive) heart failure (HCC)    a. EF 25-30% by echo in 11/2016. Recent NST showing no inducible ischemia --> therefore thought to be most consistent with nonischemic cardiomyopathy.    Depression    Diabetes mellitus    History of stroke 11/2016   incidental finding on MRI   Hypercholesteremia    Hypertension    Peripheral vascular disease ([place]) 2015   bilateral moderate CA disease-Dr Bridgett Larsson   Stroke [facility])    Past Surgical History:  Procedure Laterality Date   ABDOMINAL HYSTERECTOMY     APPENDECTOMY  1951   BIOPSY  12/13/2019   Procedure: BIOPSY;  Surgeon: Wilford Corner, MD;  Location: [facility] ENDOSCOPY;  Service: Endoscopy;;   [place]  2003   By Dr. Excell Seltzer   COLONOSCOPY WITH PROPOFOL N/A 12/13/2019   Procedure: COLONOSCOPY WITH PROPOFOL;  Surgeon: Wilford Corner, MD;  Location: [facility];  Service: Endoscopy;  Laterality: N/A;   ESOPHAGOGASTRODUODENOSCOPY N/A 12/13/2019   Procedure: ESOPHAGOGASTRODUODENOSCOPY (EGD);  Surgeon: Wilford Corner, MD;  Location: [facility];  Service: Endoscopy;  Laterality: N/A;   EYE SURGERY Bilateral 2009   Cataract surgery   FRACTURE SURGERY Right    ORIF   by Dr. Burney Gauze   HUMERUS FRACTURE SURGERY     MASTECTOMY Bilateral    NASAL SEPTUM SURGERY  1977   Dr. Ernesto Rutherford   POLYPECTOMY  12/13/2019   Procedure: POLYPECTOMY;  Surgeon: Wilford Corner, MD;  Location: [facility];  Service: Endoscopy;;    TOENAIL EXCISION      Allergies  Allergies  Allergen Reactions   Morphine And Related Nausea And Vomiting   Other Other (See Comments)    Other reaction(s): Other (See Comments) Barley & Carrots: learned through allergy testing. Unknown reaction-- MD said not very allergic but not to eat large quantities Barley & Carrots: learned through allergy testing. Unknown reaction-- MD said not very allergic but not to eat large quantities   Sulfa Antibiotics Hives   Sulfasalazine Hives   Nickel Rash    History of Present Illness    Helen [name] has a PMH of chronic systolic and diastolic CHF, paroxysmal atrial fibrillation, HTN, HLD, diabetes, prior CVA, CAD, carotid stenosis, and breast cancer status postchemotherapy with double mastectomy.  She was admitted to the hospital 5/22 with COVID-19 and community-acquired pneumonia.  She was seen by Dr. Oval Linsey 9/21 and was euvolemic at that time.  Her carotid duplex showed moderate right ICA and mild left ICA disease.  She was seen by Caron Presume, PA-C on 02/13/2021.  During that time she reported she was living in senior living facility.  She has 2 children out of her a children which live locally.  She felt she had recovered well from her COVID-19 infection and pneumonia.  Her breathing had returned to its baseline.  She continued to use nighttime O2 which she had been on for several years.  However, she had been  prescribed daytime O2 during her hospitalization.  During her follow-up she was on 1 L O2 nasal cannula.  She reported she plans to talk to her PCP about discontinuing her daytime oxygen. She denied chest pain, syncope, PND, orthopnea, and lower extremity swelling.  She did mention lightheadedness with position changes which had been worse since her recent illness.  She was doing physical therapy which she enjoyed.  She denied dyspnea.  She reported compliance with apixaban and denied bleeding issues.  She was not monitoring her  weights.  She wondered if her Lasix was as effective as it previously was.  Her echocardiogram 09/12/2020 showed improved LVEF from 25-30% in 2018-50-55%.  It was felt that the murmur that was heard during exam could be attributed to calcium seen on her aortic valve.  No stenosis or regurgitation was noted.  She presents the clinic today for follow-up evaluation and states she feels well.  She continues to be physically active doing physical therapy at least 3 days/week.  She ambulates using her wheeled walker.  She reports compliance with her medications.  She does note occasional episodes of dizziness.  We reviewed the importance of increased p.o. hydration.  She reports that she could improve in this area.  Her dizziness appears to be intermittent and occasional.  She also reports that she has been eating ice cream every night a week.  I have asked her to use moderation with her diet.  We reviewed her carotid Doppler studies and her echocardiogram.  She expressed understanding.  I will continue her current medication regimen, have her maintain her physical activity, increase the fiber in her diet.  We will have her follow-up in around 6 months.  Today she denies chest pain, shortness of breath, lower extremity edema, fatigue, palpitations, melena, hematuria, hemoptysis, diaphoresis, weakness, presyncope, syncope, orthopnea, and PND.   Home Medications    Prior to Admission medications   Medication Sig Start Date End Date Taking? Authorizing Provider  atorvastatin (LIPITOR) 80 MG tablet Take 80 mg by mouth at bedtime.    [provider]  BIOTIN PO Take 1 tablet by mouth daily.    [provider]  bismuth subsalicylate (PEPTO BISMOL) 262 MG chewable tablet Chew 524 mg by mouth as needed for indigestion or diarrhea or loose stools.    [provider]  carvedilol (COREG) 25 MG tablet TAKE 1 TABLET BY MOUTH  TWICE DAILY WITH MEALS Patient taking differently: Take 25 mg by mouth  2 (two) times daily with a meal. 08/06/20   Skeet Latch, MD  cholecalciferol (VITAMIN D) 1000 units tablet Take 1,000 Units by mouth every evening.     [provider]  ELIQUIS 5 MG TABS tablet TAKE 1 TABLET BY MOUTH  TWICE DAILY Patient taking differently: Take 5 mg by mouth 2 (two) times daily. 06/19/20   Skeet Latch, MD  ENTRESTO 49-51 MG TAKE 1 TABLET BY MOUTH  TWICE DAILY 03/28/21   Skeet Latch, MD  furosemide (LASIX) 20 MG tablet TAKE 1 TABLET BY MOUTH  DAILY 03/28/21   Skeet Latch, MD  magnesium oxide (MAG-OX) 400 MG tablet 1 tablet by mouth twice a day for 1 week and then 1 daily Patient taking differently: Take 400 mg by mouth daily. 03/24/18   Skeet Latch, MD  Multiple Vitamin (MULTIVITAMIN WITH MINERALS) TABS tablet Take 1 tablet by mouth daily.     [provider]  pantoprazole (PROTONIX) 40 MG tablet Take 1 tablet (40 mg total) by mouth  2 (two) times daily before a meal. Patient taking differently: Take 40 mg by mouth daily as needed (acid reflux). 12/14/19   Amin, Jeanella Flattery, MD  senna-docusate (SENOKOT-S) 8.6-50 MG tablet Take 2 tablets by mouth at bedtime as needed for mild constipation or moderate constipation. 12/14/19   Amin, Jeanella Flattery, MD  vitamin C (ASCORBIC ACID) 500 MG tablet Take 500 mg by mouth daily.     [provider]    Family History    Family History  Problem Relation Age of Onset   Cancer Mother    Pulmonary embolism Mother    Heart disease Father    Heart attack Father    Stroke Father    Diabetes Brother    Hyperlipidemia Brother    Stroke Brother    Kidney disease Brother    She indicated that her mother is deceased. She indicated that her father is deceased.  Social History    Social History   Socioeconomic History   Marital status: Soil scientist    Spouse name: Not on file   Number of children: Not on file   Years of education: Not on file   Highest education level: Not on file   Occupational History   Not on file  Tobacco Use   Smoking status: Former    Types: Cigarettes    Quit date: 08/24/1978    Years since quitting: 42.7   Smokeless tobacco: Never  Vaping Use   Vaping Use: Never used  Substance and Sexual Activity   Alcohol use: No   Drug use: No   Sexual activity: Not Currently  Other Topics Concern   Not on file  Social History Narrative   Not on file   Social Determinants of Health   Financial Resource Strain: Not on file  Food Insecurity: Not on file  Transportation Needs: Not on file  Physical Activity: Not on file  Stress: Not on file  Social Connections: Not on file  Intimate Partner Violence: Not on file     Review of Systems    General:  No chills, fever, night sweats or weight changes.  Cardiovascular:  No chest pain, dyspnea on exertion, edema, orthopnea, palpitations, paroxysmal nocturnal dyspnea. Dermatological: No rash, lesions/masses Respiratory: No cough, dyspnea Urologic: No hematuria, dysuria Abdominal:   No nausea, vomiting, diarrhea, bright red blood per rectum, melena, or hematemesis Neurologic:  No visual changes, wkns, changes in mental status. All other systems reviewed and are otherwise negative except as noted above.  Physical Exam    VS:  BP (!) 126/57   Pulse (!) 56   Ht 5' 2.5" (1.588 m)   Wt 141 lb 3.2 oz (64 kg)   SpO2 97%   BMI 25.41 kg/m  , BMI Body mass index is 25.41 kg/m. GEN: Well nourished, well developed, in no acute distress. HEENT: normal. Neck: Supple, no JVD, carotid bruits, or masses. Cardiac: RRR, no murmurs, rubs, or gallops. No clubbing, cyanosis, edema.  Radials/DP/PT 2+ and equal bilaterally.  Respiratory:  Respirations regular and unlabored, clear to auscultation bilaterally. GI: Soft, nontender, nondistended, BS + x 4. MS: no deformity or atrophy. Skin: warm and dry, no rash. Neuro:  Strength and sensation are intact. Psych: Normal affect.  Accessory Clinical Findings     Recent Labs: 01/07/2021: ALT 36 01/09/2021: B Natriuretic Peptide 896.1; BUN 18; Creatinine, Ser 0.81; Hemoglobin 10.3; Magnesium 1.7; Platelets 229; Potassium 3.6; Sodium 135   Recent Lipid Panel    Component Value Date/Time  CHOL 84 11/16/2016 0521   TRIG 91 11/16/2016 0521   HDL 18 (L) 11/16/2016 0521   CHOLHDL 4.7 11/16/2016 0521   VLDL 18 11/16/2016 0521   LDLCALC 48 11/16/2016 0521    ECG personally reviewed by me today-none today.  Echocardiogram 05/13/2021  IMPRESSIONS     1. Left ventricular ejection fraction, by estimation, is 50 to 55%. The  left ventricle has low normal function. The left ventricle has no regional  wall motion abnormalities. Left ventricular diastolic parameters are  consistent with Grade II diastolic  dysfunction (pseudonormalization). Elevated left atrial pressure. The  average left ventricular global longitudinal strain is -12.0 %. The global  longitudinal strain is abnormal.   2. Right ventricular systolic function is normal. The right ventricular  size is normal.   3. The mitral valve is degenerative. Trivial mitral valve regurgitation.  No evidence of mitral stenosis. Severe mitral annular calcification.   4. Aortic valve leaflet motion is severely restricted. Short axis views  of the aortic valve are not very clear. However, the non-coronary cusp is  heavily calcified. Motion of the left and right cusps appears relatively  preserved. Suspect that there is  at least mild aortic stenosis. The aortic valve is normal in structure.  There is moderate calcification of the aortic valve. There is moderate  thickening of the aortic valve. Aortic valve regurgitation is not  visualized. Mild aortic valve stenosis.  Aortic valve area, by VTI measures 1.00 cm. Aortic valve mean gradient  measures 9.0 mmHg. Aortic valve Vmax measures 2.05 m/s.   5. The inferior vena cava is normal in size with greater than 50%  respiratory variability, suggesting  right atrial pressure of 3 mmHg.  Carotid ultrasound 05/16/2021 Summary:  Right Carotid: Velocities in the right ICA are consistent with a 40-59%                 stenosis. Non-hemodynamically significant plaque <50% noted  in                 the CCA. The ECA appears >50% stenosed.   Left Carotid: Velocities in the left ICA are consistent with a 40-59%  stenosis.                Non-hemodynamically significant plaque <50% noted in the  CCA. The                ECA appears >50% stenosed.   Vertebrals:  Bilateral vertebral arteries demonstrate antegrade flow.  Subclavians: Right subclavian artery flow was disturbed. Normal flow               hemodynamics were seen in the left subclavian artery.  Assessment & Plan   1.  Chronic combined systolic and diastolic CHF-no increased DOE or activity intolerance.  Echocardiogram 05/13/2021 showed improved LVEF.  Details above. Continue carvedilol, Entresto, furosemide Heart healthy low-sodium diet-salty 6 given Increase physical activity as tolerated  Essential hypertension-BP today 126/57.  Well-controlled at home. Continue carvedilol Heart healthy low-sodium diet-salty 6 given Increase physical activity as tolerated  Bilateral carotid stenosis-carotid duplex showed bilateral 40-59% carotid ICA. Continue atorvastatin Repeat carotid Dopplers in 12 months.  Hyperlipidemia-LDL 48 on 11/16/2016 Continue atorvastatin Heart healthy low-sodium high-fiber diet Increase physical activity as tolerated Follows with PCP  Paroxysmal atrial fibrillation-heart rate today 56.  Denies recent episodes of increased or irregular heartbeat. Continue carvedilol, apixaban Heart healthy low-sodium diet Increase physical activity as tolerated Avoid triggers caffeine, chocolate, EtOH, dehydration etc.  Disposition:  Follow-up with Dr. Oval Linsey or Caron Presume in  6 months.  Jossie Ng. Raysean Graumann NP-C    05/19/2021, 12:22 PM [facility]  [facility] [address] Office [phone] Fax [phone]  Notice: This dictation was prepared with Dragon dictation along with smaller phrase technology. Any transcriptional errors that result from this process are unintentional and may not be corrected upon review.  I spent 14 minutes examining this patient, reviewing medications, and using patient centered shared decision making involving her cardiac care.  Prior to her visit I spent greater than 20 minutes reviewing her past medical history,  medications, and prior cardiac tests.

## 2021-05-19 ENCOUNTER — Encounter: Payer: Self-pay | Admitting: General Practice

## 2021-05-19 ENCOUNTER — Other Ambulatory Visit: Payer: Self-pay

## 2021-05-19 ENCOUNTER — Ambulatory Visit (INDEPENDENT_AMBULATORY_CARE_PROVIDER_SITE_OTHER): Payer: Medicare Other | Admitting: General Practice

## 2021-05-19 VITALS — BP 126/57 | HR 56 | Ht 62.5 in | Wt 141.2 lb

## 2021-05-19 DIAGNOSIS — I480 Paroxysmal atrial fibrillation: Secondary | ICD-10-CM

## 2021-05-19 DIAGNOSIS — I779 Disorder of arteries and arterioles, unspecified: Secondary | ICD-10-CM

## 2021-05-19 DIAGNOSIS — I6523 Occlusion and stenosis of bilateral carotid arteries: Secondary | ICD-10-CM

## 2021-05-19 DIAGNOSIS — I10 Essential (primary) hypertension: Secondary | ICD-10-CM

## 2021-05-19 DIAGNOSIS — I5042 Chronic combined systolic (congestive) and diastolic (congestive) heart failure: Secondary | ICD-10-CM

## 2021-05-19 NOTE — Patient Instructions (Signed)
Medication Instructions:  The current medical regimen is effective;  continue present plan and medications as directed. Please refer to the Current Medication list given to you today.  *If you need a refill on your cardiac medications before your next appointment, please call your pharmacy*  Lab Work:   Testing/Procedures:  NONE    NONE  Special Instructions PLEASE READ AND FOLLOW SALTY 6-ATTACHED-1,800mg  daily  Follow-Up: Your next appointment:  6 month(s) In Person with Skeet Latch, MD OR JENNIFER LAMBERT, PA-C  DR [facility] IS NOW AT [place] at [facility]:  Address: [address]; 2nd floor, [place], [place] [address]; Phone: [phone]  At [facility], you and your health needs are our priority.  As part of our continuing mission to provide you with exceptional heart care, we have created designated Provider Care Teams.  These Care Teams include your primary Cardiologist (physician) and Advanced Practice Providers (APPs -  Physician Assistants and Nurse Practitioners) who all work together to provide you with the care you need, when you need it.            6 SALTY THINGS TO AVOID     1,800MG  DAILY

## 2021-05-21 ENCOUNTER — Other Ambulatory Visit: Payer: Self-pay | Admitting: Cardiovascular Disease

## 2021-05-21 DIAGNOSIS — R488 Other symbolic dysfunctions: Secondary | ICD-10-CM | POA: Diagnosis not present

## 2021-05-21 DIAGNOSIS — R41841 Cognitive communication deficit: Secondary | ICD-10-CM | POA: Diagnosis not present

## 2021-05-21 DIAGNOSIS — I480 Paroxysmal atrial fibrillation: Secondary | ICD-10-CM

## 2021-05-21 DIAGNOSIS — R2681 Unsteadiness on feet: Secondary | ICD-10-CM | POA: Diagnosis not present

## 2021-05-21 DIAGNOSIS — M6281 Muscle weakness (generalized): Secondary | ICD-10-CM | POA: Diagnosis not present

## 2021-05-22 NOTE — Telephone Encounter (Signed)
Prescription refill request for Eliquis received. Indication: a fib Last office visit: 05/19/21 Scr: 0.81 Age: 87 Weight: 64kg

## 2021-05-23 DIAGNOSIS — R488 Other symbolic dysfunctions: Secondary | ICD-10-CM | POA: Diagnosis not present

## 2021-05-23 DIAGNOSIS — M6281 Muscle weakness (generalized): Secondary | ICD-10-CM | POA: Diagnosis not present

## 2021-05-23 DIAGNOSIS — R2681 Unsteadiness on feet: Secondary | ICD-10-CM | POA: Diagnosis not present

## 2021-05-23 DIAGNOSIS — R41841 Cognitive communication deficit: Secondary | ICD-10-CM | POA: Diagnosis not present

## 2021-05-27 DIAGNOSIS — R488 Other symbolic dysfunctions: Secondary | ICD-10-CM | POA: Diagnosis not present

## 2021-05-27 DIAGNOSIS — R41841 Cognitive communication deficit: Secondary | ICD-10-CM | POA: Diagnosis not present

## 2021-05-28 DIAGNOSIS — R488 Other symbolic dysfunctions: Secondary | ICD-10-CM | POA: Diagnosis not present

## 2021-05-28 DIAGNOSIS — R41841 Cognitive communication deficit: Secondary | ICD-10-CM | POA: Diagnosis not present

## 2021-06-03 DIAGNOSIS — I509 Heart failure, unspecified: Secondary | ICD-10-CM | POA: Diagnosis not present

## 2021-06-03 DIAGNOSIS — I5042 Chronic combined systolic (congestive) and diastolic (congestive) heart failure: Secondary | ICD-10-CM | POA: Diagnosis not present

## 2021-06-03 DIAGNOSIS — E1122 Type 2 diabetes mellitus with diabetic chronic kidney disease: Secondary | ICD-10-CM | POA: Diagnosis not present

## 2021-06-03 DIAGNOSIS — E782 Mixed hyperlipidemia: Secondary | ICD-10-CM | POA: Diagnosis not present

## 2021-06-03 DIAGNOSIS — N183 Chronic kidney disease, stage 3 unspecified: Secondary | ICD-10-CM | POA: Diagnosis not present

## 2021-06-03 DIAGNOSIS — I639 Cerebral infarction, unspecified: Secondary | ICD-10-CM | POA: Diagnosis not present

## 2021-06-03 DIAGNOSIS — I5032 Chronic diastolic (congestive) heart failure: Secondary | ICD-10-CM | POA: Diagnosis not present

## 2021-06-03 DIAGNOSIS — I10 Essential (primary) hypertension: Secondary | ICD-10-CM | POA: Diagnosis not present

## 2021-06-03 DIAGNOSIS — E119 Type 2 diabetes mellitus without complications: Secondary | ICD-10-CM | POA: Diagnosis not present

## 2021-06-03 DIAGNOSIS — G459 Transient cerebral ischemic attack, unspecified: Secondary | ICD-10-CM | POA: Diagnosis not present

## 2021-06-03 DIAGNOSIS — D649 Anemia, unspecified: Secondary | ICD-10-CM | POA: Diagnosis not present

## 2021-06-06 DIAGNOSIS — Z23 Encounter for immunization: Secondary | ICD-10-CM | POA: Diagnosis not present

## 2021-06-17 ENCOUNTER — Other Ambulatory Visit (HOSPITAL_COMMUNITY): Payer: Self-pay | Admitting: Cardiovascular Disease

## 2021-06-17 DIAGNOSIS — I6523 Occlusion and stenosis of bilateral carotid arteries: Secondary | ICD-10-CM

## 2021-06-23 DIAGNOSIS — Z8673 Personal history of transient ischemic attack (TIA), and cerebral infarction without residual deficits: Secondary | ICD-10-CM | POA: Diagnosis not present

## 2021-06-23 DIAGNOSIS — F5101 Primary insomnia: Secondary | ICD-10-CM | POA: Diagnosis not present

## 2021-06-23 DIAGNOSIS — E1122 Type 2 diabetes mellitus with diabetic chronic kidney disease: Secondary | ICD-10-CM | POA: Diagnosis not present

## 2021-06-23 DIAGNOSIS — L659 Nonscarring hair loss, unspecified: Secondary | ICD-10-CM | POA: Diagnosis not present

## 2021-06-23 DIAGNOSIS — I5042 Chronic combined systolic (congestive) and diastolic (congestive) heart failure: Secondary | ICD-10-CM | POA: Diagnosis not present

## 2021-06-23 DIAGNOSIS — N183 Chronic kidney disease, stage 3 unspecified: Secondary | ICD-10-CM | POA: Diagnosis not present

## 2021-06-23 DIAGNOSIS — G4734 Idiopathic sleep related nonobstructive alveolar hypoventilation: Secondary | ICD-10-CM | POA: Diagnosis not present

## 2021-06-23 DIAGNOSIS — I10 Essential (primary) hypertension: Secondary | ICD-10-CM | POA: Diagnosis not present

## 2021-06-23 DIAGNOSIS — Z853 Personal history of malignant neoplasm of breast: Secondary | ICD-10-CM | POA: Diagnosis not present

## 2021-06-23 DIAGNOSIS — U071 COVID-19: Secondary | ICD-10-CM | POA: Diagnosis not present

## 2021-06-23 DIAGNOSIS — D649 Anemia, unspecified: Secondary | ICD-10-CM | POA: Diagnosis not present

## 2021-06-23 DIAGNOSIS — E782 Mixed hyperlipidemia: Secondary | ICD-10-CM | POA: Diagnosis not present

## 2021-07-05 ENCOUNTER — Other Ambulatory Visit: Payer: Self-pay | Admitting: Cardiovascular Disease

## 2021-07-07 NOTE — Telephone Encounter (Signed)
Rx(s) sent to pharmacy electronically.  

## 2021-07-16 DIAGNOSIS — G459 Transient cerebral ischemic attack, unspecified: Secondary | ICD-10-CM | POA: Diagnosis not present

## 2021-07-16 DIAGNOSIS — N183 Chronic kidney disease, stage 3 unspecified: Secondary | ICD-10-CM | POA: Diagnosis not present

## 2021-07-16 DIAGNOSIS — D649 Anemia, unspecified: Secondary | ICD-10-CM | POA: Diagnosis not present

## 2021-07-16 DIAGNOSIS — I10 Essential (primary) hypertension: Secondary | ICD-10-CM | POA: Diagnosis not present

## 2021-07-16 DIAGNOSIS — E782 Mixed hyperlipidemia: Secondary | ICD-10-CM | POA: Diagnosis not present

## 2021-07-16 DIAGNOSIS — E1122 Type 2 diabetes mellitus with diabetic chronic kidney disease: Secondary | ICD-10-CM | POA: Diagnosis not present

## 2021-07-16 DIAGNOSIS — I5042 Chronic combined systolic (congestive) and diastolic (congestive) heart failure: Secondary | ICD-10-CM | POA: Diagnosis not present

## 2021-07-29 DIAGNOSIS — E1122 Type 2 diabetes mellitus with diabetic chronic kidney disease: Secondary | ICD-10-CM | POA: Diagnosis not present

## 2021-07-29 DIAGNOSIS — D649 Anemia, unspecified: Secondary | ICD-10-CM | POA: Diagnosis not present

## 2021-07-29 DIAGNOSIS — L659 Nonscarring hair loss, unspecified: Secondary | ICD-10-CM | POA: Diagnosis not present

## 2021-07-29 DIAGNOSIS — Z8673 Personal history of transient ischemic attack (TIA), and cerebral infarction without residual deficits: Secondary | ICD-10-CM | POA: Diagnosis not present

## 2021-07-29 DIAGNOSIS — G4734 Idiopathic sleep related nonobstructive alveolar hypoventilation: Secondary | ICD-10-CM | POA: Diagnosis not present

## 2021-07-29 DIAGNOSIS — F5101 Primary insomnia: Secondary | ICD-10-CM | POA: Diagnosis not present

## 2021-07-29 DIAGNOSIS — I5042 Chronic combined systolic (congestive) and diastolic (congestive) heart failure: Secondary | ICD-10-CM | POA: Diagnosis not present

## 2021-07-29 DIAGNOSIS — E782 Mixed hyperlipidemia: Secondary | ICD-10-CM | POA: Diagnosis not present

## 2021-07-29 DIAGNOSIS — R7989 Other specified abnormal findings of blood chemistry: Secondary | ICD-10-CM | POA: Diagnosis not present

## 2021-07-29 DIAGNOSIS — N183 Chronic kidney disease, stage 3 unspecified: Secondary | ICD-10-CM | POA: Diagnosis not present

## 2021-07-29 DIAGNOSIS — R946 Abnormal results of thyroid function studies: Secondary | ICD-10-CM | POA: Diagnosis not present

## 2021-07-29 DIAGNOSIS — I10 Essential (primary) hypertension: Secondary | ICD-10-CM | POA: Diagnosis not present

## 2021-07-29 DIAGNOSIS — Z853 Personal history of malignant neoplasm of breast: Secondary | ICD-10-CM | POA: Diagnosis not present

## 2021-08-12 ENCOUNTER — Other Ambulatory Visit: Payer: Self-pay | Admitting: Cardiovascular Disease

## 2021-08-12 DIAGNOSIS — I480 Paroxysmal atrial fibrillation: Secondary | ICD-10-CM

## 2021-08-13 NOTE — Telephone Encounter (Signed)
Prescription refill request for Eliquis received. Indication:Afib Last office visit:9/22 Scr:0.8 Age: 87 Weight:64 kg  Prescription refilled

## 2021-08-14 DIAGNOSIS — R531 Weakness: Secondary | ICD-10-CM | POA: Diagnosis not present

## 2021-08-14 DIAGNOSIS — R42 Dizziness and giddiness: Secondary | ICD-10-CM | POA: Diagnosis not present

## 2021-08-20 ENCOUNTER — Telehealth: Payer: Self-pay | Admitting: Cardiovascular Disease

## 2021-08-20 ENCOUNTER — Inpatient Hospital Stay (HOSPITAL_COMMUNITY)
Admission: EM | Admit: 2021-08-20 | Discharge: 2021-08-26 | DRG: 378 | Disposition: A | Discharge status: Skilled Nursing Facility | Payer: Medicare Other | Attending: Internal Medicine | Admitting: Internal Medicine

## 2021-08-20 ENCOUNTER — Emergency Department (HOSPITAL_COMMUNITY): Payer: Medicare Other

## 2021-08-20 ENCOUNTER — Encounter (HOSPITAL_COMMUNITY): Payer: Self-pay

## 2021-08-20 ENCOUNTER — Other Ambulatory Visit: Payer: Self-pay

## 2021-08-20 DIAGNOSIS — K2960 Other gastritis without bleeding: Secondary | ICD-10-CM | POA: Diagnosis not present

## 2021-08-20 DIAGNOSIS — E871 Hypo-osmolality and hyponatremia: Secondary | ICD-10-CM | POA: Diagnosis present

## 2021-08-20 DIAGNOSIS — I739 Peripheral vascular disease, unspecified: Secondary | ICD-10-CM | POA: Diagnosis not present

## 2021-08-20 DIAGNOSIS — R059 Cough, unspecified: Secondary | ICD-10-CM | POA: Diagnosis not present

## 2021-08-20 DIAGNOSIS — Z83438 Family history of other disorder of lipoprotein metabolism and other lipidemia: Secondary | ICD-10-CM

## 2021-08-20 DIAGNOSIS — Z87891 Personal history of nicotine dependence: Secondary | ICD-10-CM | POA: Diagnosis not present

## 2021-08-20 DIAGNOSIS — Z823 Family history of stroke: Secondary | ICD-10-CM | POA: Diagnosis not present

## 2021-08-20 DIAGNOSIS — Z853 Personal history of malignant neoplasm of breast: Secondary | ICD-10-CM | POA: Diagnosis not present

## 2021-08-20 DIAGNOSIS — I5032 Chronic diastolic (congestive) heart failure: Secondary | ICD-10-CM | POA: Diagnosis not present

## 2021-08-20 DIAGNOSIS — G43909 Migraine, unspecified, not intractable, without status migrainosus: Secondary | ICD-10-CM | POA: Diagnosis not present

## 2021-08-20 DIAGNOSIS — E1151 Type 2 diabetes mellitus with diabetic peripheral angiopathy without gangrene: Secondary | ICD-10-CM | POA: Diagnosis present

## 2021-08-20 DIAGNOSIS — E7800 Pure hypercholesterolemia, unspecified: Secondary | ICD-10-CM | POA: Diagnosis not present

## 2021-08-20 DIAGNOSIS — K3189 Other diseases of stomach and duodenum: Secondary | ICD-10-CM | POA: Diagnosis not present

## 2021-08-20 DIAGNOSIS — Z9013 Acquired absence of bilateral breasts and nipples: Secondary | ICD-10-CM | POA: Diagnosis not present

## 2021-08-20 DIAGNOSIS — E860 Dehydration: Secondary | ICD-10-CM | POA: Diagnosis not present

## 2021-08-20 DIAGNOSIS — F32A Depression, unspecified: Secondary | ICD-10-CM | POA: Diagnosis not present

## 2021-08-20 DIAGNOSIS — N179 Acute kidney failure, unspecified: Secondary | ICD-10-CM | POA: Diagnosis not present

## 2021-08-20 DIAGNOSIS — R195 Other fecal abnormalities: Secondary | ICD-10-CM

## 2021-08-20 DIAGNOSIS — F339 Major depressive disorder, recurrent, unspecified: Secondary | ICD-10-CM | POA: Diagnosis not present

## 2021-08-20 DIAGNOSIS — E782 Mixed hyperlipidemia: Secondary | ICD-10-CM | POA: Diagnosis not present

## 2021-08-20 DIAGNOSIS — R404 Transient alteration of awareness: Secondary | ICD-10-CM | POA: Diagnosis not present

## 2021-08-20 DIAGNOSIS — R630 Anorexia: Secondary | ICD-10-CM | POA: Diagnosis present

## 2021-08-20 DIAGNOSIS — Z8673 Personal history of transient ischemic attack (TIA), and cerebral infarction without residual deficits: Secondary | ICD-10-CM | POA: Diagnosis not present

## 2021-08-20 DIAGNOSIS — K922 Gastrointestinal hemorrhage, unspecified: Secondary | ICD-10-CM | POA: Diagnosis not present

## 2021-08-20 DIAGNOSIS — Z7401 Bed confinement status: Secondary | ICD-10-CM | POA: Diagnosis not present

## 2021-08-20 DIAGNOSIS — Z66 Do not resuscitate: Secondary | ICD-10-CM | POA: Diagnosis present

## 2021-08-20 DIAGNOSIS — R531 Weakness: Secondary | ICD-10-CM | POA: Diagnosis not present

## 2021-08-20 DIAGNOSIS — E46 Unspecified protein-calorie malnutrition: Secondary | ICD-10-CM | POA: Diagnosis not present

## 2021-08-20 DIAGNOSIS — I428 Other cardiomyopathies: Secondary | ICD-10-CM | POA: Diagnosis present

## 2021-08-20 DIAGNOSIS — I110 Hypertensive heart disease with heart failure: Secondary | ICD-10-CM | POA: Diagnosis present

## 2021-08-20 DIAGNOSIS — I272 Pulmonary hypertension, unspecified: Secondary | ICD-10-CM | POA: Diagnosis not present

## 2021-08-20 DIAGNOSIS — D638 Anemia in other chronic diseases classified elsewhere: Secondary | ICD-10-CM | POA: Diagnosis not present

## 2021-08-20 DIAGNOSIS — Z79899 Other long term (current) drug therapy: Secondary | ICD-10-CM

## 2021-08-20 DIAGNOSIS — F411 Generalized anxiety disorder: Secondary | ICD-10-CM | POA: Diagnosis not present

## 2021-08-20 DIAGNOSIS — I10 Essential (primary) hypertension: Secondary | ICD-10-CM

## 2021-08-20 DIAGNOSIS — Z8249 Family history of ischemic heart disease and other diseases of the circulatory system: Secondary | ICD-10-CM

## 2021-08-20 DIAGNOSIS — F419 Anxiety disorder, unspecified: Secondary | ICD-10-CM | POA: Diagnosis present

## 2021-08-20 DIAGNOSIS — Z7901 Long term (current) use of anticoagulants: Secondary | ICD-10-CM | POA: Diagnosis not present

## 2021-08-20 DIAGNOSIS — Z6825 Body mass index (BMI) 25.0-25.9, adult: Secondary | ICD-10-CM

## 2021-08-20 DIAGNOSIS — D649 Anemia, unspecified: Secondary | ICD-10-CM

## 2021-08-20 DIAGNOSIS — I700 Atherosclerosis of aorta: Secondary | ICD-10-CM | POA: Diagnosis not present

## 2021-08-20 DIAGNOSIS — I5042 Chronic combined systolic (congestive) and diastolic (congestive) heart failure: Secondary | ICD-10-CM | POA: Diagnosis not present

## 2021-08-20 DIAGNOSIS — I480 Paroxysmal atrial fibrillation: Secondary | ICD-10-CM | POA: Diagnosis present

## 2021-08-20 DIAGNOSIS — Z841 Family history of disorders of kidney and ureter: Secondary | ICD-10-CM

## 2021-08-20 DIAGNOSIS — R410 Disorientation, unspecified: Secondary | ICD-10-CM | POA: Diagnosis not present

## 2021-08-20 DIAGNOSIS — Z20822 Contact with and (suspected) exposure to covid-19: Secondary | ICD-10-CM | POA: Diagnosis present

## 2021-08-20 DIAGNOSIS — K317 Polyp of stomach and duodenum: Secondary | ICD-10-CM | POA: Diagnosis present

## 2021-08-20 DIAGNOSIS — J701 Chronic and other pulmonary manifestations due to radiation: Secondary | ICD-10-CM | POA: Diagnosis not present

## 2021-08-20 DIAGNOSIS — E1122 Type 2 diabetes mellitus with diabetic chronic kidney disease: Secondary | ICD-10-CM | POA: Diagnosis not present

## 2021-08-20 DIAGNOSIS — Z833 Family history of diabetes mellitus: Secondary | ICD-10-CM

## 2021-08-20 DIAGNOSIS — Z743 Need for continuous supervision: Secondary | ICD-10-CM | POA: Diagnosis not present

## 2021-08-20 DIAGNOSIS — Z923 Personal history of irradiation: Secondary | ICD-10-CM

## 2021-08-20 DIAGNOSIS — Z1159 Encounter for screening for other viral diseases: Secondary | ICD-10-CM | POA: Diagnosis not present

## 2021-08-20 DIAGNOSIS — Z882 Allergy status to sulfonamides status: Secondary | ICD-10-CM

## 2021-08-20 DIAGNOSIS — R42 Dizziness and giddiness: Secondary | ICD-10-CM | POA: Diagnosis not present

## 2021-08-20 DIAGNOSIS — I498 Other specified cardiac arrhythmias: Secondary | ICD-10-CM | POA: Diagnosis not present

## 2021-08-20 DIAGNOSIS — M6281 Muscle weakness (generalized): Secondary | ICD-10-CM | POA: Diagnosis not present

## 2021-08-20 DIAGNOSIS — J9621 Acute and chronic respiratory failure with hypoxia: Secondary | ICD-10-CM | POA: Diagnosis not present

## 2021-08-20 DIAGNOSIS — K2970 Gastritis, unspecified, without bleeding: Secondary | ICD-10-CM | POA: Diagnosis not present

## 2021-08-20 DIAGNOSIS — Z885 Allergy status to narcotic agent status: Secondary | ICD-10-CM

## 2021-08-20 DIAGNOSIS — R7889 Finding of other specified substances, not normally found in blood: Secondary | ICD-10-CM | POA: Diagnosis not present

## 2021-08-20 DIAGNOSIS — E872 Acidosis, unspecified: Secondary | ICD-10-CM | POA: Diagnosis not present

## 2021-08-20 DIAGNOSIS — R0902 Hypoxemia: Secondary | ICD-10-CM | POA: Diagnosis not present

## 2021-08-20 DIAGNOSIS — D509 Iron deficiency anemia, unspecified: Secondary | ICD-10-CM | POA: Diagnosis not present

## 2021-08-20 DIAGNOSIS — L659 Nonscarring hair loss, unspecified: Secondary | ICD-10-CM | POA: Diagnosis not present

## 2021-08-20 DIAGNOSIS — F5101 Primary insomnia: Secondary | ICD-10-CM | POA: Diagnosis not present

## 2021-08-20 DIAGNOSIS — D62 Acute posthemorrhagic anemia: Secondary | ICD-10-CM | POA: Diagnosis present

## 2021-08-20 DIAGNOSIS — J9611 Chronic respiratory failure with hypoxia: Secondary | ICD-10-CM | POA: Diagnosis not present

## 2021-08-20 LAB — CBC WITH DIFFERENTIAL/PLATELET
Abs Immature Granulocytes: 0.14 10*3/uL — ABNORMAL HIGH (ref 0.00–0.07)
Basophils Absolute: 0 10*3/uL (ref 0.0–0.1)
Basophils Relative: 0 %
Eosinophils Absolute: 0.4 10*3/uL (ref 0.0–0.5)
Eosinophils Relative: 4 %
HCT: 16.1 % — ABNORMAL LOW (ref 36.0–46.0)
Hemoglobin: 5.1 g/dL — CL (ref 12.0–15.0)
Immature Granulocytes: 2 %
Lymphocytes Relative: 8 %
Lymphs Abs: 0.8 10*3/uL (ref 0.7–4.0)
MCH: 28.7 pg (ref 26.0–34.0)
MCHC: 31.7 g/dL (ref 30.0–36.0)
MCV: 90.4 fL (ref 80.0–100.0)
Monocytes Absolute: 0.8 10*3/uL (ref 0.1–1.0)
Monocytes Relative: 9 %
Neutro Abs: 7.3 10*3/uL (ref 1.7–7.7)
Neutrophils Relative %: 77 %
Platelets: 171 10*3/uL (ref 150–400)
RBC: 1.78 MIL/uL — ABNORMAL LOW (ref 3.87–5.11)
RDW: 19.6 % — ABNORMAL HIGH (ref 11.5–15.5)
WBC: 9.3 10*3/uL (ref 4.0–10.5)
nRBC: 0 % (ref 0.0–0.2)

## 2021-08-20 LAB — BLOOD GAS, VENOUS
Acid-Base Excess: 0.9 mmol/L (ref 0.0–2.0)
Bicarbonate: 25.8 mmol/L (ref 20.0–28.0)
O2 Saturation: 42.6 %
Patient temperature: 98.6
pCO2, Ven: 47.3 mmHg (ref 44.0–60.0)
pH, Ven: 7.356 (ref 7.250–7.430)
pO2, Ven: >31 mmHg — CL (ref 32.0–45.0)

## 2021-08-20 LAB — URINALYSIS, ROUTINE W REFLEX MICROSCOPIC
Bilirubin Urine: NEGATIVE
Glucose, UA: NEGATIVE mg/dL
Hgb urine dipstick: NEGATIVE
Ketones, ur: NEGATIVE mg/dL
Leukocytes,Ua: NEGATIVE
Nitrite: NEGATIVE
Protein, ur: NEGATIVE mg/dL
Specific Gravity, Urine: 1.014 (ref 1.005–1.030)
pH: 5 (ref 5.0–8.0)

## 2021-08-20 LAB — RESP PANEL BY RT-PCR (FLU A&B, COVID) ARPGX2
Influenza A by PCR: NEGATIVE
Influenza B by PCR: NEGATIVE
SARS Coronavirus 2 by RT PCR: NEGATIVE

## 2021-08-20 LAB — COMPREHENSIVE METABOLIC PANEL
ALT: 16 U/L (ref 0–44)
AST: 21 U/L (ref 15–41)
Albumin: 3.2 g/dL — ABNORMAL LOW (ref 3.5–5.0)
Alkaline Phosphatase: 39 U/L (ref 38–126)
Anion gap: 9 (ref 5–15)
BUN: 40 mg/dL — ABNORMAL HIGH (ref 8–23)
CO2: 25 mmol/L (ref 22–32)
Calcium: 8.3 mg/dL — ABNORMAL LOW (ref 8.9–10.3)
Chloride: 98 mmol/L (ref 98–111)
Creatinine, Ser: 0.93 mg/dL (ref 0.44–1.00)
GFR, Estimated: 59 mL/min — ABNORMAL LOW (ref >60)
Glucose, Bld: 128 mg/dL — ABNORMAL HIGH (ref 70–99)
Potassium: 4.2 mmol/L (ref 3.5–5.1)
Sodium: 132 mmol/L — ABNORMAL LOW (ref 135–145)
Total Bilirubin: 0.7 mg/dL (ref 0.3–1.2)
Total Protein: 6.4 g/dL — ABNORMAL LOW (ref 6.5–8.1)

## 2021-08-20 LAB — PROTIME-INR
INR: 2.4 — ABNORMAL HIGH (ref 0.8–1.2)
Prothrombin Time: 25.9 seconds — ABNORMAL HIGH (ref 11.4–15.2)

## 2021-08-20 LAB — TROPONIN I (HIGH SENSITIVITY)
Troponin I (High Sensitivity): 10 ng/L (ref <18)
Troponin I (High Sensitivity): 8 ng/L (ref <18)

## 2021-08-20 LAB — PREPARE RBC (CROSSMATCH)

## 2021-08-20 LAB — POC OCCULT BLOOD, ED: Fecal Occult Bld: POSITIVE — AB

## 2021-08-20 LAB — LACTIC ACID, PLASMA: Lactic Acid, Venous: 0.9 mmol/L (ref 0.5–1.9)

## 2021-08-20 MED ORDER — PANTOPRAZOLE INFUSION (NEW) - SIMPLE MED
8.0000 mg/h | INTRAVENOUS | Status: DC
  Administered 2021-08-20 – 2021-08-22 (×4): 8 mg/h via INTRAVENOUS
  Filled 2021-08-20 (×4): qty 80
  Filled 2021-08-20: qty 100

## 2021-08-20 MED ORDER — PANTOPRAZOLE 80MG IVPB - SIMPLE MED
80.0000 mg | Freq: Once | INTRAVENOUS | Status: AC
  Administered 2021-08-20: 80 mg via INTRAVENOUS
  Filled 2021-08-20: qty 80

## 2021-08-20 MED ORDER — SODIUM CHLORIDE 0.9 % IV SOLN: INTRAVENOUS | Status: DC

## 2021-08-20 MED ORDER — PANTOPRAZOLE SODIUM 40 MG IV SOLR: 40.0000 mg | Freq: Two times a day (BID) | INTRAVENOUS | Status: DC

## 2021-08-20 MED ORDER — ONDANSETRON HCL 4 MG PO TABS: 4.0000 mg | ORAL_TABLET | Freq: Four times a day (QID) | ORAL | Status: DC | PRN

## 2021-08-20 MED ORDER — FUROSEMIDE 10 MG/ML IJ SOLN
20.0000 mg | Freq: Once | INTRAMUSCULAR | Status: AC
  Administered 2021-08-20: 20:00:00 20 mg via INTRAVENOUS
  Filled 2021-08-20: qty 4

## 2021-08-20 MED ORDER — ACETAMINOPHEN 650 MG RE SUPP: 650.0000 mg | Freq: Four times a day (QID) | RECTAL | Status: DC | PRN

## 2021-08-20 MED ORDER — SODIUM CHLORIDE 0.9 % IV SOLN: 10.0000 mL/h | Freq: Once | INTRAVENOUS | Status: DC

## 2021-08-20 MED ORDER — ACETAMINOPHEN 325 MG PO TABS
650.0000 mg | ORAL_TABLET | Freq: Four times a day (QID) | ORAL | Status: DC | PRN
  Filled 2021-08-20: qty 2

## 2021-08-20 MED ORDER — ONDANSETRON HCL 4 MG/2ML IJ SOLN: 4.0000 mg | Freq: Four times a day (QID) | INTRAMUSCULAR | Status: DC | PRN

## 2021-08-20 MED ORDER — CARVEDILOL 25 MG PO TABS
25.0000 mg | ORAL_TABLET | Freq: Two times a day (BID) | ORAL | Status: DC
  Administered 2021-08-21 – 2021-08-22 (×2): 25 mg via ORAL
  Filled 2021-08-20 (×2): qty 1

## 2021-08-20 NOTE — ED Notes (Signed)
Pt to imaging at this time.

## 2021-08-20 NOTE — Telephone Encounter (Signed)
Pt c/o medication issue:  1. Name of Medication:   ELIQUIS 5 MG TABS tablet  2. How are you currently taking this medication (dosage and times per day)?  As prescribed  3. Are you having a reaction (difficulty breathing--STAT)?    4. What is your medication issue?   Patient's daughter states the patient is currently admitted and the physicians in the hospital are asking why the patient is on Eliquis. Please return call to advise when able.

## 2021-08-20 NOTE — ED Notes (Signed)
Hgb 5.1. Received from lab. EDP notified.

## 2021-08-20 NOTE — ED Triage Notes (Signed)
Pt BIB EMS from assisted living facility. Family and pt have noted that pt has had increased dizziness and confusion x2 weeks. The confusion in intermittent, pt needs a few minutes to think before she can speak. EMS reports pt is hypotensive, pt is c/o dizziness that increases when standing. Family states pt is on 2L O2 [place] during the day and used to only wear at night. Pt has hx of CHF, anemia.   20G R AC 250 mL NS given by EMS  122/76 BP after fluids

## 2021-08-20 NOTE — ED Provider Notes (Signed)
[facility] DEPT Provider Note   CSN: [id number] Arrival date & time: 08/20/21  1208     History Chief Complaint  Patient presents with   Dizziness   Altered Mental Status    Helen [name] is a 87 y.o. female.  HPI Patient but she had been weak and feeling off balance for about a week and a half.  Symptoms have worsened over the past week.  She reports that she is gotten dizzy and at times needs to use supports in her home.  She has gotten off balance.  Patient fell out of bed 2 days ago.  She did not sustain significant injury but patient is family member suspect this was due to general weakness.  The patient is gotten so weak that has been hard for her to get up out of bed without assistance.  She has not had a fever.  She does endorse a mild cough.  No significant chest pain.  No abdominal pain.  No vomiting or diarrhea.  No pain or burning urgency with urination.  Patient was recently started on Eliquis for atrial fibrillation.  She denies has been having any diarrhea or bloody looking stool.    Past Medical History:  Diagnosis Date   Anemia    Anxiety    Cancer ([place])    Breast cancer   Chronic combined systolic (congestive) and diastolic (congestive) heart failure (HCC)    a. EF 25-30% by echo in 11/2016. Recent NST showing no inducible ischemia --> therefore thought to be most consistent with nonischemic cardiomyopathy.    Depression    Diabetes mellitus    History of stroke 11/2016   incidental finding on MRI   Hypercholesteremia    Hypertension    Peripheral vascular disease ([place]) 2015   bilateral moderate CA disease-Dr Bridgett Larsson   Stroke [facility]     Patient Active Problem List   Diagnosis Date Noted   COVID-19 01/04/2021   CAP (community acquired pneumonia) 01/03/2021   Nonischemic cardiomyopathy ([place]) 12/08/2016   SOB (shortness of breath) 11/12/2016   Shortness of breath 11/11/2016   Chronic respiratory failure with hypoxia ([place])  11/11/2016   Anemia of chronic disease 03/24/2016   Chronic combined systolic and diastolic heart failure ([place]) 03/24/2016   Acute respiratory failure with hypoxia ([place]) 03/24/2016   Scarring of lung following radiation- RLL 03/24/2016   Essential hypertension    Acute encephalopathy 12/13/2015   Hyponatremia 12/13/2015   Hypokalemia 12/13/2015   UTI (lower urinary tract infection) 12/13/2015   Leukocytosis 12/13/2015   Hypercholesteremia    Diabetes mellitus without complication ([place])    History of stroke    Peripheral vascular disease ([place])    Carotid stenosis 03/13/2015    Past Surgical History:  Procedure Laterality Date   ABDOMINAL HYSTERECTOMY     APPENDECTOMY  1951   BIOPSY  12/13/2019   Procedure: BIOPSY;  Surgeon: Wilford Corner, MD;  Location: [facility] ENDOSCOPY;  Service: Endoscopy;;   [place]  2003   By Dr. Excell Seltzer   COLONOSCOPY WITH PROPOFOL N/A 12/13/2019   Procedure: COLONOSCOPY WITH PROPOFOL;  Surgeon: Wilford Corner, MD;  Location: [facility] ENDOSCOPY;  Service: Endoscopy;  Laterality: N/A;   ESOPHAGOGASTRODUODENOSCOPY N/A 12/13/2019   Procedure: ESOPHAGOGASTRODUODENOSCOPY (EGD);  Surgeon: Wilford Corner, MD;  Location: [facility];  Service: Endoscopy;  Laterality: N/A;   EYE SURGERY Bilateral 2009   Cataract surgery   FRACTURE SURGERY Right    ORIF  by Dr. Burney Gauze   HUMERUS FRACTURE SURGERY     MASTECTOMY Bilateral    NASAL SEPTUM SURGERY  1977   Dr. Ernesto Rutherford   POLYPECTOMY  12/13/2019   Procedure: POLYPECTOMY;  Surgeon: Wilford Corner, MD;  Location: [facility] ENDOSCOPY;  Service: Endoscopy;;   TOENAIL EXCISION       OB History   No obstetric history on file.     Family History  Problem Relation Age of Onset   Cancer Mother    Pulmonary embolism Mother    Heart disease Father    Heart attack Father    Stroke Father    Diabetes Brother    Hyperlipidemia Brother    Stroke Brother    Kidney disease Brother      Social History   Tobacco Use   Smoking status: Former    Types: Cigarettes    Quit date: 08/24/1978    Years since quitting: 43.0   Smokeless tobacco: Never  Vaping Use   Vaping Use: Never used  Substance Use Topics   Alcohol use: No   Drug use: No    Home Medications Prior to Admission medications   Medication Sig Start Date End Date Taking? Authorizing Provider  atorvastatin (LIPITOR) 80 MG tablet Take 80 mg by mouth at bedtime.    [provider]  BIOTIN PO Take 1 tablet by mouth daily.    [provider]  bismuth subsalicylate (PEPTO BISMOL) 262 MG chewable tablet Chew 524 mg by mouth as needed for indigestion or diarrhea or loose stools.    [provider]  carvedilol (COREG) 25 MG tablet TAKE 1 TABLET BY MOUTH  TWICE DAILY WITH MEALS 07/07/21   Skeet Latch, MD  cholecalciferol (VITAMIN D) 1000 units tablet Take 1,000 Units by mouth every evening.     [provider]  ELIQUIS 5 MG TABS tablet TAKE 1 TABLET BY MOUTH  TWICE DAILY 08/13/21   Skeet Latch, MD  ENTRESTO 49-51 MG TAKE 1 TABLET BY MOUTH  TWICE DAILY 03/28/21   Skeet Latch, MD  furosemide (LASIX) 20 MG tablet TAKE 1 TABLET BY MOUTH  DAILY 03/28/21   Skeet Latch, MD  magnesium oxide (MAG-OX) 400 MG tablet 1 tablet by mouth twice a day for 1 week and then 1 daily Patient taking differently: Take 400 mg by mouth daily. 03/24/18   Skeet Latch, MD  Multiple Vitamin (MULTIVITAMIN WITH MINERALS) TABS tablet Take 1 tablet by mouth daily.     [provider]  pantoprazole (PROTONIX) 40 MG tablet Take 1 tablet (40 mg total) by mouth 2 (two) times daily before a meal. Patient taking differently: Take 40 mg by mouth daily as needed (acid reflux). 12/14/19   Amin, Jeanella Flattery, MD  senna-docusate (SENOKOT-S) 8.6-50 MG tablet Take 2 tablets by mouth at bedtime as needed for mild constipation or moderate constipation. 12/14/19   Amin, Jeanella Flattery, MD  vitamin C  (ASCORBIC ACID) 500 MG tablet Take 500 mg by mouth daily.     [provider]    Allergies    Morphine and related, Other, Sulfa antibiotics, Sulfasalazine, and Nickel  Review of Systems   Review of Systems 10 systems reviewed negative except as per HPI Physical Exam Updated Vital Signs BP (!) 121/49    Pulse 79    Temp 98.6 F (37 C) (Rectal)    Resp 19    Ht 5' 2.5" (1.588 m)    Wt 64 kg    SpO2 97%  BMI 25.40 kg/m   Physical Exam Constitutional:      Comments: Alert nontoxic.  Mental status clear.  No respiratory distress.  HENT:     Head: Normocephalic and atraumatic.     Mouth/Throat:     Mouth: Mucous membranes are moist.     Pharynx: Oropharynx is clear.  Eyes:     Extraocular Movements: Extraocular movements intact.     Pupils: Pupils are equal, round, and reactive to light.  Cardiovascular:     Rate and Rhythm: Normal rate and regular rhythm.  Pulmonary:     Effort: Respiratory distress present.     Breath sounds: Stridor present.  Genitourinary:    Comments: BrownStool is dark greenish.  [place]. brown, no frank melena.  No red frank blood Musculoskeletal:        General: No swelling or tenderness. Normal range of motion.  Skin:    General: Skin is warm and dry.     Coloration: Skin is pale.  Neurological:     General: No focal deficit present.     Mental Status: She is oriented to person, place, and time.     Coordination: Coordination normal.     Comments: Patient is alert with clear mental status.  No focal motor deficits.  She is slow to comprehend and perform finger-nose exam but ultimately with coaching is able to do so.  She can hold each lower extremity off the bed and hold against resistance  Psychiatric:        Mood and Affect: Mood normal.    ED Results / Procedures / Treatments   Labs (all labs ordered are listed, but only abnormal results are displayed) Labs Reviewed  COMPREHENSIVE METABOLIC PANEL - Abnormal; Notable for the following  components:      Result Value   Sodium 132 (*)    Glucose, Bld 128 (*)    BUN 40 (*)    Calcium 8.3 (*)    Total Protein 6.4 (*)    Albumin 3.2 (*)    GFR, Estimated 59 (*)    All other components within normal limits  CBC WITH DIFFERENTIAL/PLATELET - Abnormal; Notable for the following components:   RBC 1.78 (*)    Hemoglobin 5.1 (*)    HCT 16.1 (*)    RDW 19.6 (*)    Abs Immature Granulocytes 0.14 (*)    All other components within normal limits  PROTIME-INR - Abnormal; Notable for the following components:   Prothrombin Time 25.9 (*)    INR 2.4 (*)    All other components within normal limits  BLOOD GAS, VENOUS - Abnormal; Notable for the following components:   pO2, Ven >31.0 (*)    All other components within normal limits  POC OCCULT BLOOD, ED - Abnormal; Notable for the following components:   Fecal Occult Bld POSITIVE (*)    All other components within normal limits  RESP PANEL BY RT-PCR (FLU A&B, COVID) ARPGX2  LACTIC ACID, PLASMA  URINALYSIS, ROUTINE W REFLEX MICROSCOPIC  LACTIC ACID, PLASMA  PREPARE RBC (CROSSMATCH)  TROPONIN I (HIGH SENSITIVITY)  TROPONIN I (HIGH SENSITIVITY)    EKG EKG Interpretation  Date/Time:  Wednesday August 20 2021 12:29:42 EST Ventricular Rate:  82 PR Interval:  237 QRS Duration: 82 QT Interval:  420 QTC Calculation: 491 R Axis:   79 Text Interpretation: Sinus rhythm Prolonged PR interval Borderline prolonged QT interval inferior lateral st depression compared to first previous Confirmed by Charlesetta Shanks [phone]) on 08/20/2021 3:17:50 PM  Radiology CT Head Wo Contrast  Result Date: 08/20/2021 CLINICAL DATA:  Dizziness and confusion EXAM: CT HEAD WITHOUT CONTRAST TECHNIQUE: Contiguous axial images were obtained from the base of the skull through the vertex without intravenous contrast. COMPARISON:  CT head dated November 16, 2016 FINDINGS: Brain: Chronic white matter ischemic change. No evidence of acute infarction, hemorrhage,  hydrocephalus, extra-axial collection or mass lesion/mass effect. Vascular: No hyperdense vessel or unexpected calcification. Skull: Normal. Negative for fracture or focal lesion. Sinuses/Orbits: Mucosal thickening of the right maxillary and ethmoid sinuses. Other: None. IMPRESSION: No acute intracranial abnormality. Electronically Signed   By: Yetta Glassman M.D.   On: 08/20/2021 14:47   DG Chest Port 1 View  Result Date: 08/20/2021 CLINICAL DATA:  Weakness, cough, dizziness and confusion. EXAM: PORTABLE CHEST 1 VIEW COMPARISON:  01/03/2021 FINDINGS: Chronic peripheral fibrosis especially at the lung bases and in the right upper lobe, with associated bandlike scarring in the right mid lung which is chronically stable. The patient is rotated to the right on today's radiograph, reducing diagnostic sensitivity and specificity. Chronic indistinctness of the right hemidiaphragm. The patient has known pleural calcifications on the right side. Atherosclerotic calcification of the aortic arch. Heart size within normal limits. IMPRESSION: 1. Stable chronic opacities along the right hemidiaphragm and in the right mid lung, much of which is likely from scarring. There is chronic reticular interstitial accentuation in the lungs as well. Portions of the right hemidiaphragm are obscured and strictly speaking, right lower lobe pneumonia is not readily excluded. 2.  Aortic Atherosclerosis (ICD10-I70.0). 3. No acute findings or progressive opacities. Electronically Signed   By: Van Clines M.D.   On: 08/20/2021 14:24    Procedures Procedures  CRITICAL CARE Performed by: Charlesetta Shanks   Total critical care time: 45 minutes  Critical care time was exclusive of separately billable procedures and treating other patients.  Critical care was necessary to treat or prevent imminent or life-threatening deterioration.  Critical care was time spent personally by me on the following activities: development of  treatment plan with patient and/or surrogate as well as nursing, discussions with consultants, evaluation of patient's response to treatment, examination of patient, obtaining history from patient or surrogate, ordering and performing treatments and interventions, ordering and review of laboratory studies, ordering and review of radiographic studies, pulse oximetry and re-evaluation of patient's condition.  Medications Ordered in ED Medications  0.9 %  sodium chloride infusion (has no administration in time range)  0.9 %  sodium chloride infusion (has no administration in time range)    ED Course  I have reviewed the triage vital signs and the nursing notes.  Pertinent labs & imaging results that were available during my care of the patient were reviewed by me and considered in my medical decision making (see chart for details).    MDM Rules/Calculators/A&P                         Patient presents with generalized weakness and dizziness.  Initially dizziness was described as vertiginous in quality.  Patient did not describe significant amount of bowel movement or bloody dark stool.  Differential included stroke, metabolic derangement, infectious etiology.  Diagnostic results are for significant anemia at 5.5.  At this point, symptoms consistent with profound anemia with generalized weakness and fatigue with mildly altered mental status noted by family.  Patient was recently started on Eliquis.  Stool does not appear grossly melanotic or bloody.  However, most likely  etiology is GI bleeding.  Patient's EKG has diffuse mild ST depression suggestive of demand ischemia.  Troponin is pending.  I have ordered blood replacement.  We will add Protonix for possible upper GI bleed.  Dr. Almyra Free to assume care for admission and review of remainder of diagnostic results.   Final Clinical Impression(s) / ED Diagnoses Final diagnoses:  Symptomatic anemia  General weakness  Anticoagulated    Rx / DC  Orders ED Discharge Orders     None        Charlesetta Shanks, MD 08/20/21 1539

## 2021-08-20 NOTE — ED Provider Notes (Signed)
Patient signed out to me is pending admission for severe anemia with GI bleed in the setting of Eliquis.  Case discussed with on-call GI physician Dr.Kakri who will see the patient.  Also discussed with hospitalist will admit the patient.  Vital signs otherwise remained stable, 3 units of blood have been ordered and Protonix ordered as well.   Luna Fuse, MD 08/20/21 [phone]

## 2021-08-20 NOTE — Telephone Encounter (Signed)
Returned call to patient's daughter (okay per DPR). Informed her that patient takes Eliquis for A.Fib and History of a stroke, dating back to June of 2019.    Patient is In ED currently

## 2021-08-20 NOTE — ED Notes (Signed)
EDP at the bedside.  ?

## 2021-08-20 NOTE — H&P (Signed)
History and Physical    Helen [name]  XHB:[id number]  DOB: June 15, 1930  DOA: 08/20/2021 PCP: Vernie Shanks, MD   Patient coming from: ALF  Chief Complaint: dizziness   HPI: Helen [name] is a 87 y.o. female with medical history of chronic combined systolic and diastolic heart failure, diabetes mellitus, hypertension, peripheral vascular disease, breast cancer, stroke, gastritis, paroxysmal A. fib who is on Eliquis. The patient is brought in from her independent living facility.  Her daughter is present to give a history.  Her daughter states that her mother has felt "dizzy" for the past few days.  She also feels that her mother has been slightly confused.  She states she fell to the floor a couple of nights ago and continued to lay there.  The patient states that she sat down on the floor and did not actually fall.  When further clarified with the patient, she feels "woozy" and not necessarily dizzy.  She has been very weak and does admit to very poor appetite.  She has not noted any nausea vomiting abdominal pain heartburn or diarrhea.  She has not noted any weight loss.   In the ED she is noted to have a hemoglobin of 5.1.  When asked about her stools, she has not noted any blood in her stools but states her stools have been a little greenish.  ED Course: BUN noted to be 40, sodium 132, hemoglobin 5.1.  Respiratory rate in the 20s, pulse ox in the mid 90s on room air. Started on IV fluids, blood transfusions ordered.  Review of Systems:  All other systems reviewed and apart from HPI, are negative.  Past Medical History:  Diagnosis Date   Anemia    Anxiety    Cancer ([place])    Breast cancer   Chronic combined systolic (congestive) and diastolic (congestive) heart failure (HCC)    a. EF 25-30% by echo in 11/2016. Recent NST showing no inducible ischemia --> therefore thought to be most consistent with nonischemic cardiomyopathy.    Depression    Diabetes mellitus    History of  stroke 11/2016   incidental finding on MRI   Hypercholesteremia    Hypertension    Peripheral vascular disease ([place]) 2015   bilateral moderate CA disease-Dr Bridgett Larsson   Stroke [facility])     Past Surgical History:  Procedure Laterality Date   ABDOMINAL HYSTERECTOMY     APPENDECTOMY  1951   BIOPSY  12/13/2019   Procedure: BIOPSY;  Surgeon: Wilford Corner, MD;  Location: [facility];  Service: Endoscopy;;   [place]  2003   By Dr. Excell Seltzer   COLONOSCOPY WITH PROPOFOL N/A 12/13/2019   Procedure: COLONOSCOPY WITH PROPOFOL;  Surgeon: Wilford Corner, MD;  Location: [facility];  Service: Endoscopy;  Laterality: N/A;   ESOPHAGOGASTRODUODENOSCOPY N/A 12/13/2019   Procedure: ESOPHAGOGASTRODUODENOSCOPY (EGD);  Surgeon: Wilford Corner, MD;  Location: [facility];  Service: Endoscopy;  Laterality: N/A;   EYE SURGERY Bilateral 2009   Cataract surgery   FRACTURE SURGERY Right    ORIF   by Dr. Burney Gauze   HUMERUS FRACTURE SURGERY     MASTECTOMY Bilateral    NASAL SEPTUM SURGERY  1977   Dr. Ernesto Rutherford   POLYPECTOMY  12/13/2019   Procedure: POLYPECTOMY;  Surgeon: Wilford Corner, MD;  Location: [facility];  Service: Endoscopy;;   TOENAIL EXCISION      Social History:   reports that she quit smoking about 43 years ago. Her  smoking use included cigarettes. She has never used smokeless tobacco. She reports that she does not drink alcohol and does not use drugs.  Allergies  Allergen Reactions   Morphine And Related Nausea And Vomiting   Other Other (See Comments)    Other reaction(s): Other (See Comments) Barley & Carrots: learned through allergy testing. Unknown reaction-- MD said not very allergic but not to eat large quantities Barley & Carrots: learned through allergy testing. Unknown reaction-- MD said not very allergic but not to eat large quantities   Sulfa Antibiotics Hives   Sulfasalazine Hives   Nickel Rash    Family History  Problem  Relation Age of Onset   Cancer Mother    Pulmonary embolism Mother    Heart disease Father    Heart attack Father    Stroke Father    Diabetes Brother    Hyperlipidemia Brother    Stroke Brother    Kidney disease Brother      Prior to Admission medications   Medication Sig Start Date End Date Taking? Authorizing Provider  atorvastatin (LIPITOR) 80 MG tablet Take 80 mg by mouth at bedtime.    [provider]  BIOTIN PO Take 1 tablet by mouth daily.    [provider]  bismuth subsalicylate (PEPTO BISMOL) 262 MG chewable tablet Chew 524 mg by mouth as needed for indigestion or diarrhea or loose stools.    [provider]  carvedilol (COREG) 25 MG tablet TAKE 1 TABLET BY MOUTH  TWICE DAILY WITH MEALS 07/07/21   Skeet Latch, MD  cholecalciferol (VITAMIN D) 1000 units tablet Take 1,000 Units by mouth every evening.     [provider]  ELIQUIS 5 MG TABS tablet TAKE 1 TABLET BY MOUTH  TWICE DAILY 08/13/21   Skeet Latch, MD  ENTRESTO 49-51 MG TAKE 1 TABLET BY MOUTH  TWICE DAILY 03/28/21   Skeet Latch, MD  furosemide (LASIX) 20 MG tablet TAKE 1 TABLET BY MOUTH  DAILY 03/28/21   Skeet Latch, MD  magnesium oxide (MAG-OX) 400 MG tablet 1 tablet by mouth twice a day for 1 week and then 1 daily Patient taking differently: Take 400 mg by mouth daily. 03/24/18   Skeet Latch, MD  Multiple Vitamin (MULTIVITAMIN WITH MINERALS) TABS tablet Take 1 tablet by mouth daily.     [provider]  pantoprazole (PROTONIX) 40 MG tablet Take 1 tablet (40 mg total) by mouth 2 (two) times daily before a meal. Patient taking differently: Take 40 mg by mouth daily as needed (acid reflux). 12/14/19   Amin, Jeanella Flattery, MD  senna-docusate (SENOKOT-S) 8.6-50 MG tablet Take 2 tablets by mouth at bedtime as needed for mild constipation or moderate constipation. 12/14/19   Amin, Jeanella Flattery, MD  vitamin C (ASCORBIC ACID) 500 MG tablet Take 500 mg by mouth  daily.     [provider]    Physical Exam: Wt Readings from Last 3 Encounters:  08/20/21 64 kg  05/19/21 64 kg  02/13/21 64.9 kg   Vitals:   08/20/21 1821 08/20/21 1830 08/20/21 1845 08/20/21 1859  BP: 108/64 (!) 139/48 (!) 131/49 (!) 131/49  Pulse: 87 88  88  Resp: (!) 25 (!) 25 (!) 22 20  Temp:      TempSrc:      SpO2:  90% 96%   Weight:      Height:          Constitutional:  Calm & comfortable Eyes: PERRLA, lids and conjunctivae normal  ENT:  Mucous membranes are moist.  Pharynx clear of exudate   Normal dentition.  Neck: Supple, no masses  Respiratory:  Faint crackles at bases Normal respiratory effort.  Cardiovascular:  S1 & S2 heard, regular rate and rhythm No Murmurs Abdomen:  Non distended No tenderness, No masses Bowel sounds normal Extremities:  No clubbing / cyanosis No pedal edema No joint deformity    Skin:  No rashes, lesions or ulcers Neurologic:  AAO x 3 CN 2-12 grossly intact Sensation intact Strength 5/5 in all 4 extremities Psychiatric:  Normal Mood and affect    Labs on Admission: I have personally reviewed following labs and imaging studies  CBC: Recent Labs  Lab 08/20/21 1249  WBC 9.3  NEUTROABS 7.3  HGB 5.1*  HCT 16.1*  MCV 90.4  PLT 409   Basic Metabolic Panel: Recent Labs  Lab 08/20/21 1249  NA 132*  K 4.2  CL 98  CO2 25  GLUCOSE 128*  BUN 40*  CREATININE 0.93  CALCIUM 8.3*   GFR: Estimated Creatinine Clearance: 36.5 mL/min (by C-G formula based on SCr of 0.93 mg/dL). Liver Function Tests: Recent Labs  Lab 08/20/21 1249  AST 21  ALT 16  ALKPHOS 39  BILITOT 0.7  PROT 6.4*  ALBUMIN 3.2*   No results for input(s): LIPASE, AMYLASE in the last 168 hours. No results for input(s): AMMONIA in the last 168 hours. Coagulation Profile: Recent Labs  Lab 08/20/21 1249  INR 2.4*    Urine analysis:    Component Value Date/Time   COLORURINE YELLOW 08/20/2021 [place]  08/20/2021 1431   APPEARANCEUR Clear 03/12/2017 1440   LABSPEC 1.014 08/20/2021 1431   PHURINE 5.0 08/20/2021 1431   GLUCOSEU NEGATIVE 08/20/2021 1431   HGBUR NEGATIVE 08/20/2021 1431   BILIRUBINUR NEGATIVE 08/20/2021 1431   BILIRUBINUR Negative 03/12/2017 1440   KETONESUR NEGATIVE 08/20/2021 1431   PROTEINUR NEGATIVE 08/20/2021 1431   NITRITE NEGATIVE 08/20/2021 1431   LEUKOCYTESUR NEGATIVE 08/20/2021 1431    Radiological Exams on Admission: CT Head Wo Contrast  Result Date: 08/20/2021 CLINICAL DATA:  Dizziness and confusion EXAM: CT HEAD WITHOUT CONTRAST TECHNIQUE: Contiguous axial images were obtained from the base of the skull through the vertex without intravenous contrast. COMPARISON:  CT head dated November 16, 2016 FINDINGS: Brain: Chronic white matter ischemic change. No evidence of acute infarction, hemorrhage, hydrocephalus, extra-axial collection or mass lesion/mass effect. Vascular: No hyperdense vessel or unexpected calcification. Skull: Normal. Negative for fracture or focal lesion. Sinuses/Orbits: Mucosal thickening of the right maxillary and ethmoid sinuses. Other: None. IMPRESSION: No acute intracranial abnormality. Electronically Signed   By: Yetta Glassman M.D.   On: 08/20/2021 14:47   DG Chest Port 1 View  Result Date: 08/20/2021 CLINICAL DATA:  Weakness, cough, dizziness and confusion. EXAM: PORTABLE CHEST 1 VIEW COMPARISON:  01/03/2021 FINDINGS: Chronic peripheral fibrosis especially at the lung bases and in the right upper lobe, with associated bandlike scarring in the right mid lung which is chronically stable. The patient is rotated to the right on today's radiograph, reducing diagnostic sensitivity and specificity. Chronic indistinctness of the right hemidiaphragm. The patient has known pleural calcifications on the right side. Atherosclerotic calcification of the aortic arch. Heart size within normal limits. IMPRESSION: 1. Stable chronic opacities along the right  hemidiaphragm and in the right mid lung, much of which is likely from scarring. There is chronic reticular interstitial accentuation in the lungs as well. Portions of the right hemidiaphragm are obscured and  strictly speaking, right lower lobe pneumonia is not readily excluded. 2.  Aortic Atherosclerosis (ICD10-I70.0). 3. No acute findings or progressive opacities. Electronically Signed   By: Van Clines M.D.   On: 08/20/2021 14:24    EKG: Independently reviewed.  Sinus rhythm with first-degree AV block and a borderline prolonged QTC at 499  Assessment/Plan Principal Problem:   Symptomatic anemia - normocytic anemia -Suspect she may have chronic blood loss  -Having symptoms of severe weakness and intermittent confusion - We will transfuse a total of 2 units tonight to get hemoglobin above 7 Have discussed with nurse that transfusions should be done slowly and she should be monitored closely for fluid overload - We will order a dose of Lasix to be given after the first unit of packed red blood cells  Active Problems:    Occult blood in stools  -[facility] has been contacted by the ED and Dr. Deno Etienne has recommended that the patient stay on clear liquids until they can evaluate her tomorrow - Prior EGD and colonoscopies from 4/21 reviewed-the patient had acute gastritis, history of polyps, 1 colon Polyp, diverticulosis and internal hemorrhoids -She has been placed on a Protonix infusion in the ED which I will continue - She is not overtly bleeding, Eliquis has not been reversed -BUN elevated which may signify an upper GI bleed but might also signify dehydration  Dehydration - Appears quite dehydrated on exam, elevated BUN/creatinine ratio (as mentioned) -Giving blood and dose of Lasix in between-hold off on any other fluids for now  Paroxysmal atrial fibrillation - Holding Eliquis - Continue Coreg as BP tolerates -Normal sinus rhythm in the ED    Chronic combined systolic and  diastolic heart failure (HCC) -Hold daily Lasix and Entresto for today and reevaluate tomorrow  History of breast cancer   Scarring of lung following radiation- RLL     DVT prophylaxis: SCDs Code Status: DO NOT RESUSCITATE Family Communication: Plan discussed with daughter Consults called: [facility], Dr. Therisa Doyne called by ED Admission status: Inpatient- Level of care: Med-Surg  Debbe Odea MD [facility] Pager: [email or web address] Password [email or web address] 7PM-7AM, please contact night-coverage   08/20/2021, 7:14 PM

## 2021-08-20 NOTE — ED Notes (Signed)
Blood transfusion started at 36mL/hr at this time.

## 2021-08-21 DIAGNOSIS — R195 Other fecal abnormalities: Secondary | ICD-10-CM

## 2021-08-21 LAB — BASIC METABOLIC PANEL
Anion gap: 11 (ref 5–15)
BUN: 42 mg/dL — ABNORMAL HIGH (ref 8–23)
CO2: 19 mmol/L — ABNORMAL LOW (ref 22–32)
Calcium: 8 mg/dL — ABNORMAL LOW (ref 8.9–10.3)
Chloride: 98 mmol/L (ref 98–111)
Creatinine, Ser: 1.36 mg/dL — ABNORMAL HIGH (ref 0.44–1.00)
GFR, Estimated: 37 mL/min — ABNORMAL LOW (ref >60)
Glucose, Bld: 103 mg/dL — ABNORMAL HIGH (ref 70–99)
Potassium: 4.3 mmol/L (ref 3.5–5.1)
Sodium: 128 mmol/L — ABNORMAL LOW (ref 135–145)

## 2021-08-21 LAB — HEMOGLOBIN AND HEMATOCRIT, BLOOD
HCT: 23.9 % — ABNORMAL LOW (ref 36.0–46.0)
Hemoglobin: 8.1 g/dL — ABNORMAL LOW (ref 12.0–15.0)

## 2021-08-21 LAB — CBC
HCT: 23.1 % — ABNORMAL LOW (ref 36.0–46.0)
Hemoglobin: 7.8 g/dL — ABNORMAL LOW (ref 12.0–15.0)
MCH: 29.3 pg (ref 26.0–34.0)
MCHC: 33.8 g/dL (ref 30.0–36.0)
MCV: 86.8 fL (ref 80.0–100.0)
Platelets: 159 10*3/uL (ref 150–400)
RBC: 2.66 MIL/uL — ABNORMAL LOW (ref 3.87–5.11)
RDW: 16.7 % — ABNORMAL HIGH (ref 11.5–15.5)
WBC: 10 10*3/uL (ref 4.0–10.5)
nRBC: 0 % (ref 0.0–0.2)

## 2021-08-21 MED ORDER — SODIUM CHLORIDE 0.9 % IV SOLN: INTRAVENOUS | Status: DC

## 2021-08-21 MED ORDER — ATORVASTATIN CALCIUM 40 MG PO TABS
80.0000 mg | ORAL_TABLET | Freq: Every day | ORAL | Status: DC
  Administered 2021-08-21 – 2021-08-22 (×2): 80 mg via ORAL
  Filled 2021-08-21 (×3): qty 2

## 2021-08-21 NOTE — Progress Notes (Addendum)
PROGRESS NOTE    Tonna Palazzi  ERX:[id number] DOB: [birth year] DOA: 08/20/2021 PCP: Vernie Shanks, MD   Chief Complaint  Patient presents with   Dizziness   Altered Mental Status    Brief Narrative:   Helen [name] is a 87 y.o. female with past medical history of atrial fibrillation on Eliquis, chronic combined systolic and diastolic heart failure, hypertension, diabetes, peripheral vascular disease, breast cancer and stroke presented to the ER with a hemoglobin of 5.1. She underwent 2 units of prbc transfusion and repeat hemoglobin improved to 7.8. GI consulted.   Assessment & Plan:   Principal Problem:   Symptomatic anemia Active Problems:   Essential hypertension   Chronic combined systolic and diastolic heart failure (HCC)   Scarring of lung following radiation- RLL   Nonischemic cardiomyopathy (HCC)   Occult blood in stools  Symptomatic anemia/ anemia of acute blood loss:  - stool for occult is positive.  - s/p 2 units of prbc transfusions and repeat hemoglobin is around 7.8.  - GI consulted, on IV PPI BID.    Mild AKI - probably sec to blood loss.  - lasix on hold.  - recheck renal parameters in am.     Hypertension:  Well controlled.    Chronic diastolic heart failure.  Echocardiogram showed Left ventricular ejection fraction, by estimation, is 50 to 55%. The left ventricle has low normal function. The left ventricle has no regional  wall motion abnormalities. Left ventricular diastolic parameters are  consistent with Grade II diastolic  dysfunction (pseudonormalization). On [place] oxygen at baseline.  Lasix and entresto on hold due to AKI and dehydration.    PAF:  Rate controlled on eliquis for anti coagulation, which is on hold for now.     H/o stroke:  On Eliquis.and lipitor.    Hyperlipidemia:  Resume statin.    Hyponatremia:  Suspect from volume depletion, dehydration.  Gently hydrate with NS at 50 ML/HR.     DVT prophylaxis:  scd's Code Status: full code.  Family Communication: family at bedside.  Disposition:   Status is: Inpatient  Remains inpatient appropriate because: further work up  by GI.        Consultants:  Gastroenterology   Procedures: none.   Antimicrobials: none.    Subjective: No chest pain , nausea, vomiting or abd pain.   Objective: Vitals:   08/20/21 2347 08/20/21 2347 08/21/21 0220 08/21/21 0537  BP: (!) 135/55 (!) 136/55 (!) 126/50 (!) 122/52  Pulse: 90 90 76 77  Resp: 18 18 16 18   Temp: 98.8 F (37.1 C) 98.8 F (37.1 C) 98.5 F (36.9 C) 99 F (37.2 C)  TempSrc:  Oral Oral Oral  SpO2:  94% 98% 97%  Weight:      Height:        Intake/Output Summary (Last 24 hours) at 08/21/2021 1253 Last data filed at 08/21/2021 1000 Gross per 24 hour  Intake 1817.51 ml  Output 950 ml  Net 867.51 ml   Filed Weights   08/20/21 1222  Weight: 64 kg    Examination:  General exam: Appears calm and comfortable  Respiratory system: Clear to auscultation. Respiratory effort normal. Cardiovascular system: S1 & S2 heard, RRR. No pedal edema. Gastrointestinal system: Abdomen is nondistended, soft and nontender.  Normal bowel sounds heard. Central nervous system: Alert and oriented. No focal neurological deficits. Extremities: Symmetric 5 x 5 power. Skin: No rashes, lesions or ulcers Psychiatry: Mood & affect appropriate.     Data  Reviewed: I have personally reviewed following labs and imaging studies  CBC: Recent Labs  Lab 08/20/21 1249 08/21/21 0415  WBC 9.3 10.0  NEUTROABS 7.3  --   HGB 5.1* 7.8*  HCT 16.1* 23.1*  MCV 90.4 86.8  PLT 171 841    Basic Metabolic Panel: Recent Labs  Lab 08/20/21 1249 08/21/21 0415  NA 132* 128*  K 4.2 4.3  CL 98 98  CO2 25 19*  GLUCOSE 128* 103*  BUN 40* 42*  CREATININE 0.93 1.36*  CALCIUM 8.3* 8.0*    GFR: Estimated Creatinine Clearance: 25 mL/min (A) (by C-G formula based on SCr of 1.36 mg/dL (H)).  Liver Function  Tests: Recent Labs  Lab 08/20/21 1249  AST 21  ALT 16  ALKPHOS 39  BILITOT 0.7  PROT 6.4*  ALBUMIN 3.2*    CBG: No results for input(s): GLUCAP in the last 168 hours.   Recent Results (from the past 240 hour(s))  Resp Panel by RT-PCR (Flu A&B, Covid) Nasopharyngeal Swab     Status: None   Collection Time: 08/20/21  2:41 PM   Specimen: Nasopharyngeal Swab; Nasopharyngeal(NP) swabs in vial transport medium  Result Value Ref Range Status   SARS Coronavirus 2 by RT PCR NEGATIVE NEGATIVE Final    Comment: (NOTE) SARS-CoV-2 target nucleic acids are NOT DETECTED.  The SARS-CoV-2 RNA is generally detectable in upper respiratory specimens during the acute phase of infection. The lowest concentration of SARS-CoV-2 viral copies this assay can detect is 138 copies/mL. A negative result does not preclude SARS-Cov-2 infection and should not be used as the sole basis for treatment or other patient management decisions. A negative result may occur with  improper specimen collection/handling, submission of specimen other than nasopharyngeal swab, presence of viral mutation(s) within the areas targeted by this assay, and inadequate number of viral copies(<138 copies/mL). A negative result must be combined with clinical observations, patient history, and epidemiological information. The expected result is Negative.  Fact Sheet for Patients:  EntrepreneurPulse.com.au  Fact Sheet for Healthcare Providers:  IncredibleEmployment.be  This test is no t yet approved or cleared by the [place] FDA and  has been authorized for detection and/or diagnosis of SARS-CoV-2 by FDA under an Emergency Use Authorization (EUA). This EUA will remain  in effect (meaning this test can be used) for the duration of the COVID-19 declaration under Section 564(b)(1) of the Act, 21 U.S.C.section 360bbb-3(b)(1), unless the authorization is terminated  or revoked sooner.        Influenza A by PCR NEGATIVE NEGATIVE Final   Influenza B by PCR NEGATIVE NEGATIVE Final    Comment: (NOTE) The Xpert Xpress SARS-CoV-2/FLU/RSV plus assay is intended as an aid in the diagnosis of influenza from Nasopharyngeal swab specimens and should not be used as a sole basis for treatment. Nasal washings and aspirates are unacceptable for Xpert Xpress SARS-CoV-2/FLU/RSV testing.  Fact Sheet for Patients: EntrepreneurPulse.com.au  Fact Sheet for Healthcare Providers: IncredibleEmployment.be  This test is not yet approved or cleared by the [place] FDA and has been authorized for detection and/or diagnosis of SARS-CoV-2 by FDA under an Emergency Use Authorization (EUA). This EUA will remain in effect (meaning this test can be used) for the duration of the COVID-19 declaration under Section 564(b)(1) of the Act, 21 U.S.C. section 360bbb-3(b)(1), unless the authorization is terminated or revoked.  Performed at [facility], [place] [address] [place], [place] [address]          Radiology Studies:  CT Head Wo Contrast  Result Date: 08/20/2021 CLINICAL DATA:  Dizziness and confusion EXAM: CT HEAD WITHOUT CONTRAST TECHNIQUE: Contiguous axial images were obtained from the base of the skull through the vertex without intravenous contrast. COMPARISON:  CT head dated November 16, 2016 FINDINGS: Brain: Chronic white matter ischemic change. No evidence of acute infarction, hemorrhage, hydrocephalus, extra-axial collection or mass lesion/mass effect. Vascular: No hyperdense vessel or unexpected calcification. Skull: Normal. Negative for fracture or focal lesion. Sinuses/Orbits: Mucosal thickening of the right maxillary and ethmoid sinuses. Other: None. IMPRESSION: No acute intracranial abnormality. Electronically Signed   By: Yetta Glassman M.D.   On: 08/20/2021 14:47   DG Chest Port 1 View  Result Date:  08/20/2021 CLINICAL DATA:  Weakness, cough, dizziness and confusion. EXAM: PORTABLE CHEST 1 VIEW COMPARISON:  01/03/2021 FINDINGS: Chronic peripheral fibrosis especially at the lung bases and in the right upper lobe, with associated bandlike scarring in the right mid lung which is chronically stable. The patient is rotated to the right on today's radiograph, reducing diagnostic sensitivity and specificity. Chronic indistinctness of the right hemidiaphragm. The patient has known pleural calcifications on the right side. Atherosclerotic calcification of the aortic arch. Heart size within normal limits. IMPRESSION: 1. Stable chronic opacities along the right hemidiaphragm and in the right mid lung, much of which is likely from scarring. There is chronic reticular interstitial accentuation in the lungs as well. Portions of the right hemidiaphragm are obscured and strictly speaking, right lower lobe pneumonia is not readily excluded. 2.  Aortic Atherosclerosis (ICD10-I70.0). 3. No acute findings or progressive opacities. Electronically Signed   By: Van Clines M.D.   On: 08/20/2021 14:24        Scheduled Meds:  carvedilol  25 mg Oral BID WC   [START ON 08/24/2021] pantoprazole  40 mg Intravenous Q12H   Continuous Infusions:  sodium chloride Stopped (08/20/21 1749)   pantoprazole 8 mg/hr (08/21/21 0747)     LOS: 1 day        Hosie Poisson, MD [facility]   To contact the attending provider between 7A-7P or the covering provider during after hours 7P-7A, please log into the web site [email or web address] and access using universal [place] password for that web site. If you do not have the password, please call the hospital operator.  08/21/2021, 12:53 PM

## 2021-08-21 NOTE — H&P (View-Only) (Signed)
[facility] Gastroenterology Consult  Referring Provider: ER Primary Care Physician:  Vernie Shanks, MD Primary Gastroenterologist: Dr. Michail Sermon  Reason for Consultation: Severe symptomatic anemia, on Eliquis  HPI: Helen [name] is a 87 y.o. female with past medical history of atrial fibrillation on Eliquis, chronic combined systolic and diastolic heart failure, hypertension, diabetes, peripheral vascular disease, breast cancer and stroke presented to the ER with a hemoglobin of 5.1. Rectal exam in the ER showed brown, greenish stool   Patient states that for the last few days she has been feeling" woozy", which she states is dizziness, feeling weak.  She normally has a bowel movement daily, denies noticing blood in stool or black stools. Denies nausea, vomiting, abdominal or rectal pain. Denies acid reflux or heartburn. Denies noticing difficulty swallowing, pain on swallowing, early satiety, unintentional weight loss or bloating.  EGD 4/21: Multiple gastric polyps, fundic gland polyps with ischemia noted on biopsy/polypectomy, no evidence of H. pylori or celiac disease Colonoscopy 4/21: 12 mm tubular adenoma removed from rectum, sigmoid diverticulosis noted  Past Medical History:  Diagnosis Date   Anemia    Anxiety    Cancer ([place])    Breast cancer   Chronic combined systolic (congestive) and diastolic (congestive) heart failure ([place])    a. EF 25-30% by echo in 11/2016. Recent NST showing no inducible ischemia --> therefore thought to be most consistent with nonischemic cardiomyopathy.    Depression    Diabetes mellitus    History of stroke 11/2016   incidental finding on MRI   Hypercholesteremia    Hypertension    Peripheral vascular disease ([place]) 2015   bilateral moderate CA disease-Dr Bridgett Larsson   Stroke [facility])     Past Surgical History:  Procedure Laterality Date   ABDOMINAL HYSTERECTOMY     APPENDECTOMY  1951   BIOPSY  12/13/2019   Procedure: BIOPSY;  Surgeon: Wilford Corner, MD;  Location: [facility] ENDOSCOPY;  Service: Endoscopy;;   [place]  2003   By Dr. Excell Seltzer   COLONOSCOPY WITH PROPOFOL N/A 12/13/2019   Procedure: COLONOSCOPY WITH PROPOFOL;  Surgeon: Wilford Corner, MD;  Location: [facility] ENDOSCOPY;  Service: Endoscopy;  Laterality: N/A;   ESOPHAGOGASTRODUODENOSCOPY N/A 12/13/2019   Procedure: ESOPHAGOGASTRODUODENOSCOPY (EGD);  Surgeon: Wilford Corner, MD;  Location: [facility];  Service: Endoscopy;  Laterality: N/A;   EYE SURGERY Bilateral 2009   Cataract surgery   FRACTURE SURGERY Right    ORIF   by Dr. Burney Gauze   HUMERUS FRACTURE SURGERY     MASTECTOMY Bilateral    NASAL SEPTUM SURGERY  1977   Dr. Ernesto Rutherford   POLYPECTOMY  12/13/2019   Procedure: POLYPECTOMY;  Surgeon: Wilford Corner, MD;  Location: [facility] ENDOSCOPY;  Service: Endoscopy;;   TOENAIL EXCISION      Prior to Admission medications   Medication Sig Start Date End Date Taking? Authorizing Provider  atorvastatin (LIPITOR) 80 MG tablet Take 80 mg by mouth at bedtime.   Yes [provider]  BIOTIN PO Take 1 tablet by mouth daily.   Yes [provider]  bismuth subsalicylate (PEPTO BISMOL) 262 MG chewable tablet Chew 524 mg by mouth as needed for indigestion or diarrhea or loose stools.   Yes [provider]  carvedilol (COREG) 25 MG tablet TAKE 1 TABLET BY MOUTH  TWICE DAILY WITH MEALS 07/07/21  Yes Skeet Latch, MD  cholecalciferol (VITAMIN D) 1000 units tablet Take 1,000 Units by mouth every evening.    Yes [provider]  ELIQUIS 5 MG TABS tablet TAKE 1 TABLET BY MOUTH  TWICE DAILY 08/13/21  Yes Skeet Latch, MD  ENTRESTO 49-51 MG TAKE 1 TABLET BY MOUTH  TWICE DAILY 03/28/21  Yes Skeet Latch, MD  furosemide (LASIX) 20 MG tablet TAKE 1 TABLET BY MOUTH  DAILY 03/28/21  Yes Skeet Latch, MD  magnesium oxide (MAG-OX) 400 MG tablet 1 tablet by mouth twice a day for 1 week and then 1 daily Patient  taking differently: Take 400 mg by mouth daily. 03/24/18  Yes Skeet Latch, MD  Multiple Vitamin (MULTIVITAMIN WITH MINERALS) TABS tablet Take 1 tablet by mouth daily.    Yes [provider]  senna-docusate (SENOKOT-S) 8.6-50 MG tablet Take 2 tablets by mouth at bedtime as needed for mild constipation or moderate constipation. 12/14/19  Yes Amin, Jeanella Flattery, MD  vitamin C (ASCORBIC ACID) 500 MG tablet Take 500 mg by mouth daily.    Yes [provider]  pantoprazole (PROTONIX) 40 MG tablet Take 1 tablet (40 mg total) by mouth 2 (two) times daily before a meal. Patient taking differently: Take 40 mg by mouth daily as needed (acid reflux). 12/14/19   Damita Lack, MD    Current Facility-Administered Medications  Medication Dose Route Frequency Provider Last Rate Last Admin   0.9 %  sodium chloride infusion  10 mL/hr Intravenous Once Charlesetta Shanks, MD   Held at 08/20/21 1749   acetaminophen (TYLENOL) tablet 650 mg  650 mg Oral Q6H PRN Debbe Odea, MD       Or   acetaminophen (TYLENOL) suppository 650 mg  650 mg Rectal Q6H PRN Debbe Odea, MD       carvedilol (COREG) tablet 25 mg  25 mg Oral BID WC Debbe Odea, MD   25 mg at 08/21/21 0746   ondansetron (ZOFRAN) tablet 4 mg  4 mg Oral Q6H PRN Debbe Odea, MD       Or   ondansetron (ZOFRAN) injection 4 mg  4 mg Intravenous Q6H PRN Debbe Odea, MD       [START ON 08/24/2021] pantoprazole (PROTONIX) injection 40 mg  40 mg Intravenous Q12H Pfeiffer, Jeannie Done, MD       pantoprozole (PROTONIX) 80 mg /NS 100 mL infusion  8 mg/hr Intravenous Continuous Charlesetta Shanks, MD 10 mL/hr at 08/21/21 0747 8 mg/hr at 08/21/21 0747    Allergies as of 08/20/2021 - Review Complete 08/20/2021  Allergen Reaction Noted   Morphine and related Nausea And Vomiting 10/03/2011   Other Other (See Comments) 10/22/2015   Sulfa antibiotics Hives 10/03/2011   Sulfasalazine Hives 10/03/2011   Nickel Rash 12/13/2015    Family History   Problem Relation Age of Onset   Cancer Mother    Pulmonary embolism Mother    Heart disease Father    Heart attack Father    Stroke Father    Diabetes Brother    Hyperlipidemia Brother    Stroke Brother    Kidney disease Brother     Social History   Socioeconomic History   Marital status: Soil scientist    Spouse name: Not on file   Number of children: Not on file   Years of education: Not on file   Highest education level: Not on file  Occupational History   Not on file  Tobacco Use   Smoking status: Former    Types: Cigarettes    Quit date: 08/24/1978    Years since quitting: 43.0   Smokeless tobacco: Never  Vaping Use  Vaping Use: Never used  Substance and Sexual Activity   Alcohol use: No   Drug use: No   Sexual activity: Not Currently  Other Topics Concern   Not on file  Social History Narrative   Not on file   Social Determinants of Health   Financial Resource Strain: Not on file  Food Insecurity: Not on file  Transportation Needs: Not on file  Physical Activity: Not on file  Stress: Not on file  Social Connections: Not on file  Intimate Partner Violence: Not on file    Review of Systems: Positive for: GI: Described in detail in HPI.    Gen: fatigue, weakness, malaise,  denies any fever, chills, rigors, night sweats, anorexia, involuntary weight loss, and sleep disorder CV: Denies chest pain, angina, palpitations, syncope, orthopnea, PND, peripheral edema, and claudication. Resp: Denies dyspnea, cough, sputum, wheezing, coughing up blood. GU : Denies urinary burning, blood in urine, urinary frequency, urinary hesitancy, nocturnal urination, and urinary incontinence. MS: Denies joint pain or swelling.  Denies muscle weakness, cramps, atrophy.  Derm: Denies rash, itching, oral ulcerations, hives, unhealing ulcers.  Psych: Denies depression, anxiety, memory loss, suicidal ideation, hallucinations,  and confusion. Heme: Denies bruising, bleeding, and  enlarged lymph nodes. Neuro:  Denies any headaches, dizziness, paresthesias. Endo:  DM,  denies any problems with thyroid, adrenal function.  Physical Exam: Vital signs in last 24 hours: Temp:  [97.8 F (36.6 C)-99.1 F (37.3 C)] 99 F (37.2 C) (12/29 0537) Pulse Rate:  [72-91] 77 (12/29 0537) Resp:  [16-35] 18 (12/29 0537) BP: (94-150)/(41-106) 122/52 (12/29 0537) SpO2:  [90 %-100 %] 97 % (12/29 0537) Weight:  [64 kg] 64 kg (12/28 1222) Last BM Date: 08/19/21  General:   Alert,  Well-developed, well-nourished, pleasant and cooperative in NAD Head:  Normocephalic and atraumatic. Eyes:  Sclera clear, no icterus.   Mild pallor Ears:  Normal auditory acuity. Nose:  No deformity, discharge,  or lesions. Mouth:  No deformity or lesions.  Oropharynx pink & moist. Neck:  Supple; no masses or thyromegaly. Lungs: On oxygen via nasal cannula Heart: Bilateral mastectomy scar noted, irregular rate Extremities:  Without clubbing or edema. Neurologic:  Alert and  oriented x4;  grossly normal neurologically. Skin:  Intact without significant lesions or rashes. Psych:  Alert and cooperative. Normal mood and affect. Abdomen:  Soft, nontender and nondistended. No masses, hepatosplenomegaly or hernias noted. Normal bowel sounds, without guarding, and without rebound.         Lab Results: Recent Labs    08/20/21 1249 08/21/21 0415  WBC 9.3 10.0  HGB 5.1* 7.8*  HCT 16.1* 23.1*  PLT 171 159   BMET Recent Labs    08/20/21 1249 08/21/21 0415  NA 132* 128*  K 4.2 4.3  CL 98 98  CO2 25 19*  GLUCOSE 128* 103*  BUN 40* 42*  CREATININE 0.93 1.36*  CALCIUM 8.3* 8.0*   LFT Recent Labs    08/20/21 1249  PROT 6.4*  ALBUMIN 3.2*  AST 21  ALT 16  ALKPHOS 39  BILITOT 0.7   PT/INR Recent Labs    08/20/21 1249  LABPROT 25.9*  INR 2.4*    Studies/Results: CT Head Wo Contrast  Result Date: 08/20/2021 CLINICAL DATA:  Dizziness and confusion EXAM: CT HEAD WITHOUT CONTRAST  TECHNIQUE: Contiguous axial images were obtained from the base of the skull through the vertex without intravenous contrast. COMPARISON:  CT head dated November 16, 2016 FINDINGS: Brain: Chronic white matter ischemic change. No  evidence of acute infarction, hemorrhage, hydrocephalus, extra-axial collection or mass lesion/mass effect. Vascular: No hyperdense vessel or unexpected calcification. Skull: Normal. Negative for fracture or focal lesion. Sinuses/Orbits: Mucosal thickening of the right maxillary and ethmoid sinuses. Other: None. IMPRESSION: No acute intracranial abnormality. Electronically Signed   By: Yetta Glassman M.D.   On: 08/20/2021 14:47   DG Chest Port 1 View  Result Date: 08/20/2021 CLINICAL DATA:  Weakness, cough, dizziness and confusion. EXAM: PORTABLE CHEST 1 VIEW COMPARISON:  01/03/2021 FINDINGS: Chronic peripheral fibrosis especially at the lung bases and in the right upper lobe, with associated bandlike scarring in the right mid lung which is chronically stable. The patient is rotated to the right on today's radiograph, reducing diagnostic sensitivity and specificity. Chronic indistinctness of the right hemidiaphragm. The patient has known pleural calcifications on the right side. Atherosclerotic calcification of the aortic arch. Heart size within normal limits. IMPRESSION: 1. Stable chronic opacities along the right hemidiaphragm and in the right mid lung, much of which is likely from scarring. There is chronic reticular interstitial accentuation in the lungs as well. Portions of the right hemidiaphragm are obscured and strictly speaking, right lower lobe pneumonia is not readily excluded. 2.  Aortic Atherosclerosis (ICD10-I70.0). 3. No acute findings or progressive opacities. Electronically Signed   By: Van Clines M.D.   On: 08/20/2021 14:24    Impression: Symptomatic anemia, HB 5.1 on presentation, increased to 7.8 post transfusion Was on Eliquis, last dose yesterday  morning  Impaired renal function, elevated BUN of 42, creatinine 1.36,, GFR 52 Mild acidosis, bicarb 19, low albumin 3.2  Plan: EGD in a.m. Continue Protonix IV drip Stable hemodynamics The risks and the benefits of the procedure were discussed with the patient in details. She understands and verbalizes consent. Clear liquid diet today and n.p.o. postmidnight.   LOS: 1 day   Ronnette Juniper, MD  08/21/2021, 8:21 AM

## 2021-08-21 NOTE — Progress Notes (Signed)
°  Transition of Care [facility]) Screening Note   Patient Details  Name: Alette Kataoka Date of Birth: 08/01/1930   Transition of Care [facility]) CM/SW Contact:    Dessa Phi, RN Phone Number: 08/21/2021, 11:37 AM    Transition of Care Department [facility]) has reviewed patient and no TOC needs have been identified at this time. We will continue to monitor patient advancement through interdisciplinary progression rounds. If new patient transition needs arise, please place a TOC consult.

## 2021-08-21 NOTE — Consult Note (Signed)
[facility] Gastroenterology Consult  Referring Provider: ER Primary Care Physician:  Vernie Shanks, MD Primary Gastroenterologist: Dr. Michail Sermon  Reason for Consultation: Severe symptomatic anemia, on Eliquis  HPI: Helen [name] is a 87 y.o. female with past medical history of atrial fibrillation on Eliquis, chronic combined systolic and diastolic heart failure, hypertension, diabetes, peripheral vascular disease, breast cancer and stroke presented to the ER with a hemoglobin of 5.1. Rectal exam in the ER showed brown, greenish stool   Patient states that for the last few days she has been feeling" woozy", which she states is dizziness, feeling weak.  She normally has a bowel movement daily, denies noticing blood in stool or black stools. Denies nausea, vomiting, abdominal or rectal pain. Denies acid reflux or heartburn. Denies noticing difficulty swallowing, pain on swallowing, early satiety, unintentional weight loss or bloating.  EGD 4/21: Multiple gastric polyps, fundic gland polyps with ischemia noted on biopsy/polypectomy, no evidence of H. pylori or celiac disease Colonoscopy 4/21: 12 mm tubular adenoma removed from rectum, sigmoid diverticulosis noted  Past Medical History:  Diagnosis Date   Anemia    Anxiety    Cancer ([place])    Breast cancer   Chronic combined systolic (congestive) and diastolic (congestive) heart failure ([place])    a. EF 25-30% by echo in 11/2016. Recent NST showing no inducible ischemia --> therefore thought to be most consistent with nonischemic cardiomyopathy.    Depression    Diabetes mellitus    History of stroke 11/2016   incidental finding on MRI   Hypercholesteremia    Hypertension    Peripheral vascular disease ([place]) 2015   bilateral moderate CA disease-Dr Bridgett Larsson   Stroke [facility])     Past Surgical History:  Procedure Laterality Date   ABDOMINAL HYSTERECTOMY     APPENDECTOMY  1951   BIOPSY  12/13/2019   Procedure: BIOPSY;  Surgeon: Wilford Corner, MD;  Location: [facility] ENDOSCOPY;  Service: Endoscopy;;   [place]  2003   By Dr. Excell Seltzer   COLONOSCOPY WITH PROPOFOL N/A 12/13/2019   Procedure: COLONOSCOPY WITH PROPOFOL;  Surgeon: Wilford Corner, MD;  Location: [facility] ENDOSCOPY;  Service: Endoscopy;  Laterality: N/A;   ESOPHAGOGASTRODUODENOSCOPY N/A 12/13/2019   Procedure: ESOPHAGOGASTRODUODENOSCOPY (EGD);  Surgeon: Wilford Corner, MD;  Location: [facility];  Service: Endoscopy;  Laterality: N/A;   EYE SURGERY Bilateral 2009   Cataract surgery   FRACTURE SURGERY Right    ORIF   by Dr. Burney Gauze   HUMERUS FRACTURE SURGERY     MASTECTOMY Bilateral    NASAL SEPTUM SURGERY  1977   Dr. Ernesto Rutherford   POLYPECTOMY  12/13/2019   Procedure: POLYPECTOMY;  Surgeon: Wilford Corner, MD;  Location: [facility] ENDOSCOPY;  Service: Endoscopy;;   TOENAIL EXCISION      Prior to Admission medications   Medication Sig Start Date End Date Taking? Authorizing Provider  atorvastatin (LIPITOR) 80 MG tablet Take 80 mg by mouth at bedtime.   Yes [provider]  BIOTIN PO Take 1 tablet by mouth daily.   Yes [provider]  bismuth subsalicylate (PEPTO BISMOL) 262 MG chewable tablet Chew 524 mg by mouth as needed for indigestion or diarrhea or loose stools.   Yes [provider]  carvedilol (COREG) 25 MG tablet TAKE 1 TABLET BY MOUTH  TWICE DAILY WITH MEALS 07/07/21  Yes Skeet Latch, MD  cholecalciferol (VITAMIN D) 1000 units tablet Take 1,000 Units by mouth every evening.    Yes [provider]  ELIQUIS 5 MG TABS tablet TAKE 1 TABLET BY MOUTH  TWICE DAILY 08/13/21  Yes Skeet Latch, MD  ENTRESTO 49-51 MG TAKE 1 TABLET BY MOUTH  TWICE DAILY 03/28/21  Yes Skeet Latch, MD  furosemide (LASIX) 20 MG tablet TAKE 1 TABLET BY MOUTH  DAILY 03/28/21  Yes Skeet Latch, MD  magnesium oxide (MAG-OX) 400 MG tablet 1 tablet by mouth twice a day for 1 week and then 1 daily Patient  taking differently: Take 400 mg by mouth daily. 03/24/18  Yes Skeet Latch, MD  Multiple Vitamin (MULTIVITAMIN WITH MINERALS) TABS tablet Take 1 tablet by mouth daily.    Yes [provider]  senna-docusate (SENOKOT-S) 8.6-50 MG tablet Take 2 tablets by mouth at bedtime as needed for mild constipation or moderate constipation. 12/14/19  Yes Amin, Jeanella Flattery, MD  vitamin C (ASCORBIC ACID) 500 MG tablet Take 500 mg by mouth daily.    Yes [provider]  pantoprazole (PROTONIX) 40 MG tablet Take 1 tablet (40 mg total) by mouth 2 (two) times daily before a meal. Patient taking differently: Take 40 mg by mouth daily as needed (acid reflux). 12/14/19   Damita Lack, MD    Current Facility-Administered Medications  Medication Dose Route Frequency Provider Last Rate Last Admin   0.9 %  sodium chloride infusion  10 mL/hr Intravenous Once Charlesetta Shanks, MD   Held at 08/20/21 1749   acetaminophen (TYLENOL) tablet 650 mg  650 mg Oral Q6H PRN Debbe Odea, MD       Or   acetaminophen (TYLENOL) suppository 650 mg  650 mg Rectal Q6H PRN Debbe Odea, MD       carvedilol (COREG) tablet 25 mg  25 mg Oral BID WC Debbe Odea, MD   25 mg at 08/21/21 0746   ondansetron (ZOFRAN) tablet 4 mg  4 mg Oral Q6H PRN Debbe Odea, MD       Or   ondansetron (ZOFRAN) injection 4 mg  4 mg Intravenous Q6H PRN Debbe Odea, MD       [START ON 08/24/2021] pantoprazole (PROTONIX) injection 40 mg  40 mg Intravenous Q12H Pfeiffer, Jeannie Done, MD       pantoprozole (PROTONIX) 80 mg /NS 100 mL infusion  8 mg/hr Intravenous Continuous Charlesetta Shanks, MD 10 mL/hr at 08/21/21 0747 8 mg/hr at 08/21/21 0747    Allergies as of 08/20/2021 - Review Complete 08/20/2021  Allergen Reaction Noted   Morphine and related Nausea And Vomiting 10/03/2011   Other Other (See Comments) 10/22/2015   Sulfa antibiotics Hives 10/03/2011   Sulfasalazine Hives 10/03/2011   Nickel Rash 12/13/2015    Family History   Problem Relation Age of Onset   Cancer Mother    Pulmonary embolism Mother    Heart disease Father    Heart attack Father    Stroke Father    Diabetes Brother    Hyperlipidemia Brother    Stroke Brother    Kidney disease Brother     Social History   Socioeconomic History   Marital status: Soil scientist    Spouse name: Not on file   Number of children: Not on file   Years of education: Not on file   Highest education level: Not on file  Occupational History   Not on file  Tobacco Use   Smoking status: Former    Types: Cigarettes    Quit date: 08/24/1978    Years since quitting: 43.0   Smokeless tobacco: Never  Vaping Use  Vaping Use: Never used  Substance and Sexual Activity   Alcohol use: No   Drug use: No   Sexual activity: Not Currently  Other Topics Concern   Not on file  Social History Narrative   Not on file   Social Determinants of Health   Financial Resource Strain: Not on file  Food Insecurity: Not on file  Transportation Needs: Not on file  Physical Activity: Not on file  Stress: Not on file  Social Connections: Not on file  Intimate Partner Violence: Not on file    Review of Systems: Positive for: GI: Described in detail in HPI.    Gen: fatigue, weakness, malaise,  denies any fever, chills, rigors, night sweats, anorexia, involuntary weight loss, and sleep disorder CV: Denies chest pain, angina, palpitations, syncope, orthopnea, PND, peripheral edema, and claudication. Resp: Denies dyspnea, cough, sputum, wheezing, coughing up blood. GU : Denies urinary burning, blood in urine, urinary frequency, urinary hesitancy, nocturnal urination, and urinary incontinence. MS: Denies joint pain or swelling.  Denies muscle weakness, cramps, atrophy.  Derm: Denies rash, itching, oral ulcerations, hives, unhealing ulcers.  Psych: Denies depression, anxiety, memory loss, suicidal ideation, hallucinations,  and confusion. Heme: Denies bruising, bleeding, and  enlarged lymph nodes. Neuro:  Denies any headaches, dizziness, paresthesias. Endo:  DM,  denies any problems with thyroid, adrenal function.  Physical Exam: Vital signs in last 24 hours: Temp:  [97.8 F (36.6 C)-99.1 F (37.3 C)] 99 F (37.2 C) (12/29 0537) Pulse Rate:  [72-91] 77 (12/29 0537) Resp:  [16-35] 18 (12/29 0537) BP: (94-150)/(41-106) 122/52 (12/29 0537) SpO2:  [90 %-100 %] 97 % (12/29 0537) Weight:  [64 kg] 64 kg (12/28 1222) Last BM Date: 08/19/21  General:   Alert,  Well-developed, well-nourished, pleasant and cooperative in NAD Head:  Normocephalic and atraumatic. Eyes:  Sclera clear, no icterus.   Mild pallor Ears:  Normal auditory acuity. Nose:  No deformity, discharge,  or lesions. Mouth:  No deformity or lesions.  Oropharynx pink & moist. Neck:  Supple; no masses or thyromegaly. Lungs: On oxygen via nasal cannula Heart: Bilateral mastectomy scar noted, irregular rate Extremities:  Without clubbing or edema. Neurologic:  Alert and  oriented x4;  grossly normal neurologically. Skin:  Intact without significant lesions or rashes. Psych:  Alert and cooperative. Normal mood and affect. Abdomen:  Soft, nontender and nondistended. No masses, hepatosplenomegaly or hernias noted. Normal bowel sounds, without guarding, and without rebound.         Lab Results: Recent Labs    08/20/21 1249 08/21/21 0415  WBC 9.3 10.0  HGB 5.1* 7.8*  HCT 16.1* 23.1*  PLT 171 159   BMET Recent Labs    08/20/21 1249 08/21/21 0415  NA 132* 128*  K 4.2 4.3  CL 98 98  CO2 25 19*  GLUCOSE 128* 103*  BUN 40* 42*  CREATININE 0.93 1.36*  CALCIUM 8.3* 8.0*   LFT Recent Labs    08/20/21 1249  PROT 6.4*  ALBUMIN 3.2*  AST 21  ALT 16  ALKPHOS 39  BILITOT 0.7   PT/INR Recent Labs    08/20/21 1249  LABPROT 25.9*  INR 2.4*    Studies/Results: CT Head Wo Contrast  Result Date: 08/20/2021 CLINICAL DATA:  Dizziness and confusion EXAM: CT HEAD WITHOUT CONTRAST  TECHNIQUE: Contiguous axial images were obtained from the base of the skull through the vertex without intravenous contrast. COMPARISON:  CT head dated November 16, 2016 FINDINGS: Brain: Chronic white matter ischemic change. No  evidence of acute infarction, hemorrhage, hydrocephalus, extra-axial collection or mass lesion/mass effect. Vascular: No hyperdense vessel or unexpected calcification. Skull: Normal. Negative for fracture or focal lesion. Sinuses/Orbits: Mucosal thickening of the right maxillary and ethmoid sinuses. Other: None. IMPRESSION: No acute intracranial abnormality. Electronically Signed   By: Yetta Glassman M.D.   On: 08/20/2021 14:47   DG Chest Port 1 View  Result Date: 08/20/2021 CLINICAL DATA:  Weakness, cough, dizziness and confusion. EXAM: PORTABLE CHEST 1 VIEW COMPARISON:  01/03/2021 FINDINGS: Chronic peripheral fibrosis especially at the lung bases and in the right upper lobe, with associated bandlike scarring in the right mid lung which is chronically stable. The patient is rotated to the right on today's radiograph, reducing diagnostic sensitivity and specificity. Chronic indistinctness of the right hemidiaphragm. The patient has known pleural calcifications on the right side. Atherosclerotic calcification of the aortic arch. Heart size within normal limits. IMPRESSION: 1. Stable chronic opacities along the right hemidiaphragm and in the right mid lung, much of which is likely from scarring. There is chronic reticular interstitial accentuation in the lungs as well. Portions of the right hemidiaphragm are obscured and strictly speaking, right lower lobe pneumonia is not readily excluded. 2.  Aortic Atherosclerosis (ICD10-I70.0). 3. No acute findings or progressive opacities. Electronically Signed   By: Van Clines M.D.   On: 08/20/2021 14:24    Impression: Symptomatic anemia, HB 5.1 on presentation, increased to 7.8 post transfusion Was on Eliquis, last dose yesterday  morning  Impaired renal function, elevated BUN of 42, creatinine 1.36,, GFR 52 Mild acidosis, bicarb 19, low albumin 3.2  Plan: EGD in a.m. Continue Protonix IV drip Stable hemodynamics The risks and the benefits of the procedure were discussed with the patient in details. She understands and verbalizes consent. Clear liquid diet today and n.p.o. postmidnight.   LOS: 1 day   Ronnette Juniper, MD  08/21/2021, 8:21 AM

## 2021-08-22 ENCOUNTER — Encounter (HOSPITAL_COMMUNITY)
Admission: EM | Disposition: A | Discharge status: Skilled Nursing Facility | Payer: Self-pay | Source: Home / Self Care | Attending: Internal Medicine

## 2021-08-22 ENCOUNTER — Inpatient Hospital Stay (HOSPITAL_COMMUNITY): Payer: Medicare Other | Admitting: Certified Registered Nurse Anesthetist

## 2021-08-22 ENCOUNTER — Encounter (HOSPITAL_COMMUNITY): Payer: Self-pay | Admitting: Internal Medicine

## 2021-08-22 HISTORY — PX: ESOPHAGOGASTRODUODENOSCOPY (EGD) WITH PROPOFOL: SHX5813

## 2021-08-22 LAB — CBC
HCT: 23.7 % — ABNORMAL LOW (ref 36.0–46.0)
Hemoglobin: 8 g/dL — ABNORMAL LOW (ref 12.0–15.0)
MCH: 29.6 pg (ref 26.0–34.0)
MCHC: 33.8 g/dL (ref 30.0–36.0)
MCV: 87.8 fL (ref 80.0–100.0)
Platelets: 150 10*3/uL (ref 150–400)
RBC: 2.7 MIL/uL — ABNORMAL LOW (ref 3.87–5.11)
RDW: 17.2 % — ABNORMAL HIGH (ref 11.5–15.5)
WBC: 9.8 10*3/uL (ref 4.0–10.5)
nRBC: 0 % (ref 0.0–0.2)

## 2021-08-22 SURGERY — ESOPHAGOGASTRODUODENOSCOPY (EGD) WITH PROPOFOL
Anesthesia: Monitor Anesthesia Care

## 2021-08-22 MED ORDER — APIXABAN 5 MG PO TABS
5.0000 mg | ORAL_TABLET | Freq: Two times a day (BID) | ORAL | Status: DC
  Administered 2021-08-22 – 2021-08-26 (×10): 5 mg via ORAL
  Filled 2021-08-22 (×10): qty 1

## 2021-08-22 MED ORDER — ASCORBIC ACID 500 MG PO TABS
500.0000 mg | ORAL_TABLET | Freq: Every day | ORAL | Status: DC
  Administered 2021-08-23 – 2021-08-26 (×4): 500 mg via ORAL
  Filled 2021-08-22 (×4): qty 1

## 2021-08-22 MED ORDER — FUROSEMIDE 20 MG PO TABS: 20.0000 mg | ORAL_TABLET | Freq: Every day | ORAL | Status: DC

## 2021-08-22 MED ORDER — PROPOFOL 500 MG/50ML IV EMUL
INTRAVENOUS | Status: DC | PRN
  Administered 2021-08-22: 40 mg via INTRAVENOUS
  Administered 2021-08-22: 50 mg via INTRAVENOUS

## 2021-08-22 MED ORDER — SENNOSIDES-DOCUSATE SODIUM 8.6-50 MG PO TABS: 2.0000 | ORAL_TABLET | Freq: Every evening | ORAL | Status: DC | PRN

## 2021-08-22 MED ORDER — BISMUTH SUBSALICYLATE 262 MG PO CHEW: 524.0000 mg | CHEWABLE_TABLET | ORAL | Status: DC | PRN

## 2021-08-22 MED ORDER — CARVEDILOL 12.5 MG PO TABS
12.5000 mg | ORAL_TABLET | Freq: Two times a day (BID) | ORAL | Status: DC
  Administered 2021-08-23 – 2021-08-26 (×8): 12.5 mg via ORAL
  Filled 2021-08-22 (×9): qty 1

## 2021-08-22 MED ORDER — SUCRALFATE 1 G PO TABS
1.0000 g | ORAL_TABLET | Freq: Two times a day (BID) | ORAL | Status: DC
  Administered 2021-08-22 – 2021-08-26 (×9): 1 g via ORAL
  Filled 2021-08-22 (×9): qty 1

## 2021-08-22 MED ORDER — LACTATED RINGERS IV SOLN
INTRAVENOUS | Status: AC | PRN
  Administered 2021-08-22: 1000 mL via INTRAVENOUS

## 2021-08-22 MED ORDER — LACTATED RINGERS IV BOLUS
250.0000 mL | Freq: Once | INTRAVENOUS | Status: AC
  Administered 2021-08-22: 10:00:00 250 mL via INTRAVENOUS

## 2021-08-22 MED ORDER — PANTOPRAZOLE SODIUM 40 MG PO TBEC
40.0000 mg | DELAYED_RELEASE_TABLET | Freq: Two times a day (BID) | ORAL | Status: DC
  Administered 2021-08-22 – 2021-08-26 (×9): 40 mg via ORAL
  Filled 2021-08-22 (×9): qty 1

## 2021-08-22 MED ORDER — ADULT MULTIVITAMIN W/MINERALS CH
1.0000 | ORAL_TABLET | Freq: Every day | ORAL | Status: DC
  Administered 2021-08-23 – 2021-08-26 (×4): 1 via ORAL
  Filled 2021-08-22 (×4): qty 1

## 2021-08-22 MED ORDER — VITAMIN D 25 MCG (1000 UNIT) PO TABS
1000.0000 [IU] | ORAL_TABLET | Freq: Every evening | ORAL | Status: DC
  Administered 2021-08-22 – 2021-08-26 (×5): 1000 [IU] via ORAL
  Filled 2021-08-22 (×5): qty 1

## 2021-08-22 MED ORDER — SACUBITRIL-VALSARTAN 49-51 MG PO TABS
1.0000 | ORAL_TABLET | Freq: Two times a day (BID) | ORAL | Status: DC
  Filled 2021-08-22: qty 1

## 2021-08-22 MED ORDER — BIOTIN 2.5 MG PO TABS: ORAL_TABLET | Freq: Every day | ORAL | Status: DC

## 2021-08-22 MED ORDER — MAGNESIUM OXIDE -MG SUPPLEMENT 400 (240 MG) MG PO TABS
400.0000 mg | ORAL_TABLET | Freq: Every day | ORAL | Status: DC
  Administered 2021-08-23 – 2021-08-26 (×4): 400 mg via ORAL
  Filled 2021-08-22 (×8): qty 1

## 2021-08-22 SURGICAL SUPPLY — 15 items

## 2021-08-22 NOTE — Interval H&P Note (Signed)
History and Physical Interval Note:  89/female with symptomatic anemia, was on eliquis,for an EGD. 08/22/2021 8:55 AM  Helen [name]  has presented today forEGD, with the diagnosis of anemia,was on eliquis.  The various methods of treatment have been discussed with the patient and family. After consideration of risks, benefits and other options for treatment, the patient has consented to  Procedure(s): ESOPHAGOGASTRODUODENOSCOPY (EGD) WITH PROPOFOL (N/A) as a surgical intervention.  The patient's history has been reviewed, patient examined, no change in status, stable for surgery.  I have reviewed the patient's chart and labs.  Questions were answered to the patient's satisfaction.     Ronnette Juniper

## 2021-08-22 NOTE — Evaluation (Signed)
Occupational Therapy Evaluation Patient Details Name: Helen [name] MRN: [id number] DOB: 04-08-1930 Today's Date: 08/22/2021   History of Present Illness 87 y.o. female with past medical history of atrial fibrillation on Eliquis, chronic combined systolic and diastolic heart failure, hypertension, diabetes, peripheral vascular disease, breast cancer and stroke presented to the ER with a hemoglobin of 5.1. Dx of anemia. GI workup in progress.   Clinical Impression   Pt was functioning modified independence with a rollator prior to admission. She reports using 1L 02 at her ILF and relying on staff or family for IADL. Pt presents with generalized weakness, impaired cognition and decreased balance. She fatigues easily. Pt requires up to min guard assist for ADL and mobility. Recommending SNF for rehab unless family can provide 24 hour care initially upon return home. Will follow acutely.      Recommendations for follow up therapy are one component of a multi-disciplinary discharge planning process, led by the attending physician.  Recommendations may be updated based on patient status, additional functional criteria and insurance authorization.   Follow Up Recommendations  Skilled nursing-short term rehab (<3 hours/day) (maybe able to go home with 24 hour care intially)    Assistance Recommended at Discharge Frequent or constant Supervision/Assistance  Functional Status Assessment  Patient has had a recent decline in their functional status and demonstrates the ability to make significant improvements in function in a reasonable and predictable amount of time.  Equipment Recommendations  BSC/3in1    Recommendations for Other Services       Precautions / Restrictions Precautions Precautions: Fall Precaution Comments: found herself on the floor just PTA, can't recall how she got there Restrictions Weight Bearing Restrictions: No      Mobility Bed Mobility Overal bed mobility:  Modified Independent             General bed mobility comments: returned to supine without difficulty    Transfers Overall transfer level: Needs assistance Equipment used: Rolling walker (2 wheels) Transfers: Sit to/from Stand Sit to Stand: Min guard     Step pivot transfers: Min guard     General transfer comment: cues for hand placement      Balance Overall balance assessment: Needs assistance Sitting-balance support: Feet supported;No upper extremity supported Sitting balance-Leahy Scale: Good     Standing balance support: Bilateral upper extremity supported Standing balance-Leahy Scale: Poor Standing balance comment: relies on BUE support, can release walker in static standing                           ADL either performed or assessed with clinical judgement   ADL Overall ADL's : Needs assistance/impaired Eating/Feeding: Independent;Sitting   Grooming: Min guard;Standing;Wash/dry hands   Upper Body Bathing: Set up;Sitting   Lower Body Bathing: Min guard;Sit to/from stand   Upper Body Dressing : Set up;Sitting   Lower Body Dressing: Min guard;Sit to/from stand   Toilet Transfer: Min guard;Ambulation;Rolling walker (2 wheels)   Toileting- Clothing Manipulation and Hygiene: Min guard;Sit to/from stand       Functional mobility during ADLs: Min guard;Rolling walker (2 wheels)       Vision Baseline Vision/History: 1 Wears glasses Ability to See in Adequate Light: 0 Adequate Patient Visual Report: No change from baseline       Perception     Praxis      Pertinent Vitals/Pain Pain Assessment: No/denies pain     Hand Dominance Right   Extremity/Trunk Assessment Upper Extremity  Assessment Upper Extremity Assessment: RUE deficits/detail RUE Deficits / Details: longstanding shoulder limitations RUE Coordination: decreased gross motor   Lower Extremity Assessment Lower Extremity Assessment: Defer to PT evaluation   Cervical /  Trunk Assessment Cervical / Trunk Assessment: Normal   Communication Communication Communication: HOH   Cognition Arousal/Alertness: Awake/alert Behavior During Therapy: WFL for tasks assessed/performed Overall Cognitive Status: Impaired/Different from baseline Area of Impairment: Memory;Safety/judgement                     Memory: Decreased short-term memory   Safety/Judgement: Decreased awareness of safety;Decreased awareness of deficits     General Comments: pt reports she is on 1L 02 at home     General Comments       Exercises     Shoulder Instructions      [place] expects to be discharged to:: Private residence Living Arrangements: Alone Available Help at Discharge: Family;Available PRN/intermittently Type of Home: Independent living facility Home Access: Level entry     Home Layout: One level     Bathroom Shower/Tub: Occupational psychologist: Standard     Home Equipment: Rollator (4 wheels);Shower seat;Grab bars - tub/shower;Grab bars - toilet;Hand held shower head   Additional Comments: Is in ILF, family looking into ALF, pt has 7 children some of whom live locally, ILF helps with housekeeping and linens      Prior Functioning/Environment Prior Level of Function : Independent/Modified Independent             Mobility Comments: walked with rollator ADLs Comments: independent with bathing & dressing, doesn't drive, walked to dining room to retrieve meals 3x per day        OT Problem List: Decreased activity tolerance;Impaired balance (sitting and/or standing);Decreased strength;Decreased cognition;Decreased safety awareness;Decreased knowledge of use of DME or AE      OT Treatment/Interventions: Self-care/ADL training;DME and/or AE instruction;Therapeutic activities;Patient/family education;Balance training    OT Goals(Current goals can be found in the care plan section) Acute Rehab OT Goals OT Goal  Formulation: With patient Time For Goal Achievement: 09/05/21 Potential to Achieve Goals: Good ADL Goals Pt Will Perform Grooming: with modified independence;standing Pt Will Perform Lower Body Bathing: with modified independence;sit to/from stand Pt Will Perform Lower Body Dressing: with modified independence;sit to/from stand Pt Will Transfer to Toilet: with modified independence;ambulating;bedside commode (over toilet) Pt Will Perform Toileting - Clothing Manipulation and hygiene: with modified independence;sit to/from stand  OT Frequency: Min 2X/week   Barriers to D/C:            Co-evaluation              AM-PAC OT "6 Clicks" Daily Activity     Outcome Measure Help from another person eating meals?: None Help from another person taking care of personal grooming?: A Little Help from another person toileting, which includes using toliet, bedpan, or urinal?: A Little Help from another person bathing (including washing, rinsing, drying)?: A Little Help from another person to put on and taking off regular upper body clothing?: A Little Help from another person to put on and taking off regular lower body clothing?: A Little 6 Click Score: 19   End of Session Equipment Utilized During Treatment: Rolling walker (2 wheels);Gait belt;Oxygen (1L)  Activity Tolerance: Patient tolerated treatment well Patient left: in bed;with call bell/phone within reach;with bed alarm set  OT Visit Diagnosis: Unsteadiness on feet (R26.81);Other abnormalities of gait and mobility (R26.89);Muscle weakness (generalized) (M62.81);Other symptoms and signs  involving cognitive function                Time: 1400-1432 OT Time Calculation (min): 32 min Charges:  OT General Charges $OT Visit: 1 Visit OT Evaluation $OT Eval Low Complexity: 1 Low OT Treatments $Self Care/Home Management : 8-22 mins  Nestor Lewandowsky, OTR/L Acute Rehabilitation Services Pager: [phone] Office: [phone]    [name] 08/22/2021, 3:41 PM

## 2021-08-22 NOTE — Anesthesia Preprocedure Evaluation (Addendum)
Anesthesia Evaluation  Patient identified by MRN, date of birth, ID band Patient awake    Reviewed: Allergy & Precautions, NPO status , Patient's Chart, lab work & pertinent test results, reviewed documented beta blocker date and time   History of Anesthesia Complications Negative for: history of anesthetic complications  Airway Mallampati: II  TM Distance: >3 FB Neck ROM: Full    Dental  (+) Dental Advisory Given, Caps   Pulmonary former smoker,    breath sounds clear to auscultation       Cardiovascular hypertension, Pt. on medications and Pt. on home beta blockers (-) angina+ Peripheral Vascular Disease and +CHF Delene Loll)   Rhythm:Regular Rate:Normal  04/2021 ECHO: EF 50-55%, LV has low normal function, Grade 2 DD, RVF normal, trivial MR, mild AS   Neuro/Psych Anxiety Depression CVA, No Residual Symptoms    GI/Hepatic Neg liver ROS, GERD  Medicated and Controlled,  Endo/Other  diabetes (glu 103)  Renal/GU Renal InsufficiencyRenal disease     Musculoskeletal   Abdominal   Peds  Hematology  (+) Blood dyscrasia (Hb 8.0), anemia , eliquis   Anesthesia Other Findings   Reproductive/Obstetrics                            Anesthesia Physical Anesthesia Plan  ASA: 3  Anesthesia Plan: MAC   Post-op Pain Management: Minimal or no pain anticipated   Induction:   PONV Risk Score and Plan: 2 and Treatment may vary due to age or medical condition  Airway Management Planned: Natural Airway and Nasal Cannula  Additional Equipment: None  Intra-op Plan:   Post-operative Plan:   Informed Consent: I have reviewed the patients History and Physical, chart, labs and discussed the procedure including the risks, benefits and alternatives for the proposed anesthesia with the patient or authorized representative who has indicated his/her understanding and acceptance.     Dental advisory  given  Plan Discussed with: CRNA and Surgeon  Anesthesia Plan Comments:        Anesthesia Quick Evaluation

## 2021-08-22 NOTE — Evaluation (Addendum)
Physical Therapy Evaluation Patient Details Name: Helen [name] MRN: [id number] DOB: 1929/08/29 Today's Date: 08/22/2021  History of Present Illness  87 y.o. female with past medical history of atrial fibrillation on Eliquis, chronic combined systolic and diastolic heart failure, hypertension, diabetes, peripheral vascular disease, breast cancer and stroke presented to the ER with a hemoglobin of 5.1. Dx of anemia. GI workup in progress.  Clinical Impression  Pt admitted with above diagnosis. Min assist for stand pivot transfer to recliner with RW. Pt quite fatigued with the transfer and was unable to tolerate further activity, so ambulation was deferred. She may need ST-SNF, depending on progress.  If her mobility progresses, she could likely DC back to her ILF with supervision, if family is able to provide this. Pt currently with functional limitations due to the deficits listed below (see PT Problem List). Pt will benefit from skilled PT to increase their independence and safety with mobility to allow discharge to the venue listed below.          Recommendations for follow up therapy are one component of a multi-disciplinary discharge planning process, led by the attending physician.  Recommendations may be updated based on patient status, additional functional criteria and insurance authorization.  Follow Up Recommendations Skilled nursing-short term rehab (<3 hours/day)    Assistance Recommended at Discharge Frequent or constant Supervision/Assistance  Functional Status Assessment Patient has had a recent decline in their functional status and demonstrates the ability to make significant improvements in function in a reasonable and predictable amount of time.  Equipment Recommendations  None recommended by PT    Recommendations for Other Services       Precautions / Restrictions Precautions Precautions: Fall Precaution Comments: found herself on the floor just PTA, can't recall how  she got there Restrictions Weight Bearing Restrictions: No      Mobility  Bed Mobility Overal bed mobility: Modified Independent             General bed mobility comments: used rail, HOB up    Transfers Overall transfer level: Needs assistance Equipment used: Rolling walker (2 wheels) Transfers: Sit to/from Stand;Bed to chair/wheelchair/BSC Sit to Stand: Min assist   Step pivot transfers: Min guard       General transfer comment: sit to stand x 2 with RW, VCs for hand placement, min A to power up    Ambulation/Gait               General Gait Details: deferred 2* fatigue with stand pivot transfer  Stairs            Wheelchair Mobility    Modified Rankin (Stroke Patients Only)       Balance Overall balance assessment: Needs assistance Sitting-balance support: Feet supported;No upper extremity supported Sitting balance-Leahy Scale: Good     Standing balance support: Bilateral upper extremity supported Standing balance-Leahy Scale: Poor Standing balance comment: relies on BUE support                             Pertinent Vitals/Pain Pain Assessment: No/denies pain    Home Living Family/patient expects to be discharged to:: Private residence Living Arrangements: Alone Available Help at Discharge: Family;Available PRN/intermittently Type of Home: Independent living facility Home Access: Level entry       Home Layout: One level Home Equipment: Rollator (4 wheels);Shower seat Additional Comments: Is in ILF, family looking into ALF, pt has 7 children some of whom live locally  Prior Function Prior Level of Function : Independent/Modified Independent             Mobility Comments: walked with rollator ADLs Comments: independent with bathing & dressing, doesn't drive     Hand Dominance        Extremity/Trunk Assessment   Upper Extremity Assessment Upper Extremity Assessment: RUE deficits/detail RUE Deficits /  Details: active shoulder elevation limited to ~30* (pt reports 2* mastectomy)    Lower Extremity Assessment Lower Extremity Assessment: Overall WFL for tasks assessed    Cervical / Trunk Assessment Cervical / Trunk Assessment: Normal  Communication   Communication: No difficulties  Cognition Arousal/Alertness: Awake/alert Behavior During Therapy: WFL for tasks assessed/performed Overall Cognitive Status: Within Functional Limits for tasks assessed                                          General Comments      Exercises     Assessment/Plan    PT Assessment Patient needs continued PT services  PT Problem List Decreased activity tolerance;Decreased balance;Decreased mobility       PT Treatment Interventions Therapeutic exercise;Therapeutic activities;Patient/family education;Gait training;Functional mobility training    PT Goals (Current goals can be found in the Care Plan section)  Acute Rehab PT Goals Patient Stated Goal: to get stronger PT Goal Formulation: With patient Time For Goal Achievement: 09/05/21 Potential to Achieve Goals: Good    Frequency Min 3X/week   Barriers to discharge        Co-evaluation               AM-PAC PT "6 Clicks" Mobility  Outcome Measure Help needed turning from your back to your side while in a flat bed without using bedrails?: A Little Help needed moving from lying on your back to sitting on the side of a flat bed without using bedrails?: A Little Help needed moving to and from a bed to a chair (including a wheelchair)?: A Little Help needed standing up from a chair using your arms (e.g., wheelchair or bedside chair)?: A Little Help needed to walk in hospital room?: A Lot Help needed climbing 3-5 steps with a railing? : Total 6 Click Score: 15    End of Session Equipment Utilized During Treatment: Gait belt Activity Tolerance: Patient limited by fatigue Patient left: in chair;with call bell/phone within  reach;with chair alarm set Nurse Communication: Mobility status PT Visit Diagnosis: Difficulty in walking, not elsewhere classified (R26.2)    Time: 2162-4469 PT Time Calculation (min) (ACUTE ONLY): 18 min   Charges:   PT Evaluation $PT Eval Moderate Complexity: 1 Mod         Philomena Doheny PT 08/22/2021  Acute Rehabilitation Services Pager [phone] Office [phone]

## 2021-08-22 NOTE — Care Management Important Message (Signed)
Important Message  Patient Details IM Letter placed in Patients room. Name: Helen [name] MRN: [id number] Date of Birth: 02-03-1930   Medicare Important Message Given:  Yes     Kerin Salen 08/22/2021, 11:17 AM

## 2021-08-22 NOTE — Anesthesia Postprocedure Evaluation (Signed)
Anesthesia Post Note  Patient: Helen [name]  Procedure(s) Performed: ESOPHAGOGASTRODUODENOSCOPY (EGD) WITH PROPOFOL     Patient location during evaluation: Endoscopy Anesthesia Type: MAC Level of consciousness: awake and alert, patient cooperative and oriented Pain management: pain level controlled Vital Signs Assessment: post-procedure vital signs reviewed and stable Respiratory status: nonlabored ventilation, spontaneous breathing and respiratory function stable Cardiovascular status: blood pressure returned to baseline and stable Postop Assessment: no apparent nausea or vomiting Anesthetic complications: no   No notable events documented.  Last Vitals:  Vitals:   08/22/21 1000 08/22/21 1109  BP: (!) 129/51 (!) 126/52  Pulse: 71 69  Resp: (!) 23 20  Temp:    SpO2: 96% 94%    Last Pain:  Vitals:   08/22/21 0848  TempSrc: Oral  PainSc: 0-No pain                 Lizett Chowning,E. Brookelynne Dimperio

## 2021-08-22 NOTE — Progress Notes (Signed)
PROGRESS NOTE    Helen [name]  AVW:[id number] DOB: 01-07-1930 DOA: 08/20/2021 PCP: Vernie Shanks, MD   Brief Narrative:87 y.o. female with past medical history of atrial fibrillation on Eliquis, chronic combined systolic and diastolic heart failure, hypertension, diabetes, peripheral vascular disease, breast cancer and stroke presented to the ER with a hemoglobin of 5.1. She underwent 2 units of prbc transfusion and repeat hemoglobin improved to 8.0. GI consulted.   Assessment & Plan:   Principal Problem:   Symptomatic anemia Active Problems:   Essential hypertension   Chronic combined systolic and diastolic heart failure (HCC)   Scarring of lung following radiation- RLL   Nonischemic cardiomyopathy (HCC)   Occult blood in stools  Symptomatic anemia/ anemia of acute blood loss:  - stool for occult is positive.  - s/p 2 units of prbc transfusions and repeat hemoglobin is around 8.  - GI consulted, on IV PPI BID.  EGD 08/22/2021 by Dr. Therisa Doyne shows normal esophagus, multiple gastric polyps, normal examined duodenum, erythematous mucosa in the stomach.  GI recommends to restart her on regular diet and okay to resume Eliquis, sucralfate 1 g twice daily and to continue PPI.    AKI creatinine 1.36 from 0.93. - probably sec to blood loss.  - lasix on hold.  - recheck renal parameters in am.  -Hold Entresto   Hypertension:  Well controlled.    Chronic diastolic heart failure.  Echocardiogram showed Left ventricular ejection fraction, by estimation, is 50 to 55%. The left ventricle has low normal function. The left ventricle has no regional  wall motion abnormalities. Left ventricular diastolic parameters are  consistent with Grade II diastolic  dysfunction (pseudonormalization). On [place] oxygen at baseline.  Lasix and entresto on hold due to AKI and dehydration.      PAF:  Rate controlled on coreg ,on eliquis for anti coagulation      H/o stroke:  On Eliquis.and lipitor.       Hyperlipidemia:  Resume statin.     Hyponatremia:  Suspect from volume depletion, dehydration.  Gently hydrate with NS at 50 ML/HR.  Labs in am     Estimated body mass index is 25.4 kg/m as calculated from the following:   Height as of this encounter: 5' 2.5" (1.588 m).   Weight as of this encounter: 64 kg.  DVT prophylaxis: scd Code Status DNR Family Communication: Discussed with daughter at bedside.  disposition Plan:  Status is: Inpatient  Remains inpatient appropriate because:  Gi bleed   Consultants:  gi  Procedures: EGD 08/22/2021 Antimicrobials: None  Subjective: Patient is resting in bed after the EGD daughter by the bedside She reports that she is very weak and is not ready to go home today, she lives alone PT consult is pending  Objective: Vitals:   08/22/21 0946 08/22/21 0956 08/22/21 1000 08/22/21 1109  BP: (!) 117/43 (!) 134/45 (!) 129/51 (!) 126/52  Pulse: 70 74 71 69  Resp: (!) 22 (!) 23 (!) 23 20  Temp:      TempSrc:      SpO2: 93% 100% 96% 94%  Weight:      Height:        Intake/Output Summary (Last 24 hours) at 08/22/2021 1321 Last data filed at 08/22/2021 8295 Gross per 24 hour  Intake 1385.03 ml  Output 150 ml  Net 1235.03 ml   Filed Weights   08/20/21 1222 08/22/21 0848  Weight: 64 kg 64 kg    Examination:  General exam:  Appears calm and comfortable  Respiratory system: Clear to auscultation. Respiratory effort normal. Cardiovascular system: S1 & S2 heard, RRR. No JVD, murmurs, rubs, gallops or clicks. No pedal edema. Gastrointestinal system: Abdomen is nondistended, soft and nontender. No organomegaly or masses felt. Normal bowel sounds heard. Central nervous system: Alert and oriented. No focal neurological deficits. Extremities: Symmetric 5 x 5 power. Skin: No rashes, lesions or ulcers Psychiatry: Judgement and insight appear normal. Mood & affect appropriate.     Data Reviewed: I have personally reviewed following  labs and imaging studies  CBC: Recent Labs  Lab 08/20/21 1249 08/21/21 0415 08/21/21 1758 08/22/21 0444  WBC 9.3 10.0  --  9.8  NEUTROABS 7.3  --   --   --   HGB 5.1* 7.8* 8.1* 8.0*  HCT 16.1* 23.1* 23.9* 23.7*  MCV 90.4 86.8  --  87.8  PLT 171 159  --  798   Basic Metabolic Panel: Recent Labs  Lab 08/20/21 1249 08/21/21 0415  NA 132* 128*  K 4.2 4.3  CL 98 98  CO2 25 19*  GLUCOSE 128* 103*  BUN 40* 42*  CREATININE 0.93 1.36*  CALCIUM 8.3* 8.0*   GFR: Estimated Creatinine Clearance: 25 mL/min (A) (by C-G formula based on SCr of 1.36 mg/dL (H)). Liver Function Tests: Recent Labs  Lab 08/20/21 1249  AST 21  ALT 16  ALKPHOS 39  BILITOT 0.7  PROT 6.4*  ALBUMIN 3.2*   No results for input(s): LIPASE, AMYLASE in the last 168 hours. No results for input(s): AMMONIA in the last 168 hours. Coagulation Profile: Recent Labs  Lab 08/20/21 1249  INR 2.4*   Cardiac Enzymes: No results for input(s): CKTOTAL, CKMB, CKMBINDEX, TROPONINI in the last 168 hours. BNP (last 3 results) No results for input(s): PROBNP in the last 8760 hours. HbA1C: No results for input(s): HGBA1C in the last 72 hours. CBG: No results for input(s): GLUCAP in the last 168 hours. Lipid Profile: No results for input(s): CHOL, HDL, LDLCALC, TRIG, CHOLHDL, LDLDIRECT in the last 72 hours. Thyroid Function Tests: No results for input(s): TSH, T4TOTAL, FREET4, T3FREE, THYROIDAB in the last 72 hours. Anemia Panel: No results for input(s): VITAMINB12, FOLATE, FERRITIN, TIBC, IRON, RETICCTPCT in the last 72 hours. Sepsis Labs: Recent Labs  Lab 08/20/21 1249  LATICACIDVEN 0.9    Recent Results (from the past 240 hour(s))  Resp Panel by RT-PCR (Flu A&B, Covid) Nasopharyngeal Swab     Status: None   Collection Time: 08/20/21  2:41 PM   Specimen: Nasopharyngeal Swab; Nasopharyngeal(NP) swabs in vial transport medium  Result Value Ref Range Status   SARS Coronavirus 2 by RT PCR NEGATIVE NEGATIVE  Final    Comment: (NOTE) SARS-CoV-2 target nucleic acids are NOT DETECTED.  The SARS-CoV-2 RNA is generally detectable in upper respiratory specimens during the acute phase of infection. The lowest concentration of SARS-CoV-2 viral copies this assay can detect is 138 copies/mL. A negative result does not preclude SARS-Cov-2 infection and should not be used as the sole basis for treatment or other patient management decisions. A negative result may occur with  improper specimen collection/handling, submission of specimen other than nasopharyngeal swab, presence of viral mutation(s) within the areas targeted by this assay, and inadequate number of viral copies(<138 copies/mL). A negative result must be combined with clinical observations, patient history, and epidemiological information. The expected result is Negative.  Fact Sheet for Patients:  EntrepreneurPulse.com.au  Fact Sheet for Healthcare Providers:  IncredibleEmployment.be  This test is  no t yet approved or cleared by the [place] and  has been authorized for detection and/or diagnosis of SARS-CoV-2 by FDA under an Emergency Use Authorization (EUA). This EUA will remain  in effect (meaning this test can be used) for the duration of the COVID-19 declaration under Section 564(b)(1) of the Act, 21 U.S.C.section 360bbb-3(b)(1), unless the authorization is terminated  or revoked sooner.       Influenza A by PCR NEGATIVE NEGATIVE Final   Influenza B by PCR NEGATIVE NEGATIVE Final    Comment: (NOTE) The Xpert Xpress SARS-CoV-2/FLU/RSV plus assay is intended as an aid in the diagnosis of influenza from Nasopharyngeal swab specimens and should not be used as a sole basis for treatment. Nasal washings and aspirates are unacceptable for Xpert Xpress SARS-CoV-2/FLU/RSV testing.  Fact Sheet for Patients: EntrepreneurPulse.com.au  Fact Sheet for Healthcare  Providers: IncredibleEmployment.be  This test is not yet approved or cleared by the [place] FDA and has been authorized for detection and/or diagnosis of SARS-CoV-2 by FDA under an Emergency Use Authorization (EUA). This EUA will remain in effect (meaning this test can be used) for the duration of the COVID-19 declaration under Section 564(b)(1) of the Act, 21 U.S.C. section 360bbb-3(b)(1), unless the authorization is terminated or revoked.  Performed at [facility], [place] [address] [place], [place] [address]          Radiology Studies: CT Head Wo Contrast  Result Date: 08/20/2021 CLINICAL DATA:  Dizziness and confusion EXAM: CT HEAD WITHOUT CONTRAST TECHNIQUE: Contiguous axial images were obtained from the base of the skull through the vertex without intravenous contrast. COMPARISON:  CT head dated November 16, 2016 FINDINGS: Brain: Chronic white matter ischemic change. No evidence of acute infarction, hemorrhage, hydrocephalus, extra-axial collection or mass lesion/mass effect. Vascular: No hyperdense vessel or unexpected calcification. Skull: Normal. Negative for fracture or focal lesion. Sinuses/Orbits: Mucosal thickening of the right maxillary and ethmoid sinuses. Other: None. IMPRESSION: No acute intracranial abnormality. Electronically Signed   By: Yetta Glassman M.D.   On: 08/20/2021 14:47   DG Chest Port 1 View  Result Date: 08/20/2021 CLINICAL DATA:  Weakness, cough, dizziness and confusion. EXAM: PORTABLE CHEST 1 VIEW COMPARISON:  01/03/2021 FINDINGS: Chronic peripheral fibrosis especially at the lung bases and in the right upper lobe, with associated bandlike scarring in the right mid lung which is chronically stable. The patient is rotated to the right on today's radiograph, reducing diagnostic sensitivity and specificity. Chronic indistinctness of the right hemidiaphragm. The patient has known pleural calcifications on the right  side. Atherosclerotic calcification of the aortic arch. Heart size within normal limits. IMPRESSION: 1. Stable chronic opacities along the right hemidiaphragm and in the right mid lung, much of which is likely from scarring. There is chronic reticular interstitial accentuation in the lungs as well. Portions of the right hemidiaphragm are obscured and strictly speaking, right lower lobe pneumonia is not readily excluded. 2.  Aortic Atherosclerosis (ICD10-I70.0). 3. No acute findings or progressive opacities. Electronically Signed   By: Van Clines M.D.   On: 08/20/2021 14:24        Scheduled Meds:  apixaban  5 mg Oral BID   [START ON 08/23/2021] vitamin C  500 mg Oral Daily   atorvastatin  80 mg Oral QHS   carvedilol  25 mg Oral BID WC   cholecalciferol  1,000 Units Oral QPM   furosemide  20 mg Oral Daily   [START ON 08/23/2021] magnesium oxide  400  mg Oral Daily   [START ON 08/23/2021] multivitamin with minerals  1 tablet Oral Daily   pantoprazole  40 mg Oral BID AC   sucralfate  1 g Oral BID WC   Continuous Infusions:  sodium chloride Stopped (08/20/21 1749)   sodium chloride 50 mL/hr at 08/21/21 1452     LOS: 2 days    Time spent: 70 min   Georgette Shell, MD  08/22/2021, 1:21 PM

## 2021-08-22 NOTE — Progress Notes (Signed)
OT Cancellation Note  Patient Details Name: Helen [name] MRN: [id number] DOB: 06-23-1930   Cancelled Treatment:    Reason Eval/Treat Not Completed: Patient at procedure or test/ unavailable  [name] 08/22/2021, 8:41 AM Nestor Lewandowsky, OTR/L Acute Rehabilitation Services Pager: [phone] Office: [phone]

## 2021-08-22 NOTE — Op Note (Signed)
[facility] Patient Name: Helen [name] Procedure Date: 08/22/2021 MRN: [id number] Attending MD: Ronnette Juniper , MD Date of Birth: 1930-04-09 CSN: [id number] Age: 87 Admit Type: Inpatient Procedure:                Upper GI endoscopy Indications:              Unexplained iron deficiency anemia, Heme positive                            stool Providers:                Ronnette Juniper, MD, Elmer Ramp. Tilden Dome, RN, [employer],                            Technician, Eliberto Ivory Referring MD:             [facility] Medicines:                Monitored Anesthesia Care Complications:            No immediate complications. Estimated Blood Loss:     Estimated blood loss: none. Procedure:                Pre-Anesthesia Assessment:                           - Prior to the procedure, a History and Physical                            was performed, and patient medications and                            allergies were reviewed. The patient's tolerance of                            previous anesthesia was also reviewed. The risks                            and benefits of the procedure and the sedation                            options and risks were discussed with the patient.                            All questions were answered, and informed consent                            was obtained. Prior Anticoagulants: The patient has                            taken Eliquis (apixaban), last dose was 2 days                            prior to procedure. ASA Grade Assessment: III - A  patient with severe systemic disease. After                            reviewing the risks and benefits, the patient was                            deemed in satisfactory condition to undergo the                            procedure.                           After obtaining informed consent, the endoscope was                            passed under direct vision. Throughout the                             procedure, the patient's blood pressure, pulse, and                            oxygen saturations were monitored continuously. The                            [id number]-[id number] ([id number]) Olympus endoscope was introduced                            through the mouth, and advanced to the second part                            of duodenum. The upper GI endoscopy was                            accomplished without difficulty. The patient                            tolerated the procedure well. Scope In: Scope Out: Findings:      The examined esophagus was normal.      Multiple medium pedunculated and sessile polyps with no bleeding and no       stigmata of recent bleeding were found in the cardia, in the gastric       fundus, in the gastric body, on the greater curvature of the stomach and       on the lesser curvature of the stomach.      The cardia and gastric fundus were normal on retroflexion.      The examined duodenum was normal.      Diffuse mildly erythematous mucosa without bleeding was found in the       entire examined stomach. Impression:               - Normal esophagus.                           - Multiple gastric polyps.                           -  Normal examined duodenum.                           - Erythematous mucosa in the stomach.                           - No specimens collected. Moderate Sedation:      Patient did not receive moderate sedation for this procedure, but       instead received monitored anesthesia care. Recommendation:           - Resume regular diet.                           - Continue present medications. OK to resume                            Eliquis.                           - Use sucralfate tablets 1 gram PO BID. Procedure Code(s):        --- Professional ---                           [phone], Esophagogastroduodenoscopy, flexible,                            transoral; diagnostic, including collection of                             specimen(s) by brushing or washing, when performed                            (separate procedure) Diagnosis Code(s):        --- Professional ---                           K31.7, Polyp of stomach and duodenum                           K31.89, Other diseases of stomach and duodenum                           D50.9, Iron deficiency anemia, unspecified                           R19.5, Other fecal abnormalities CPT copyright 2019 American Medical Association. All rights reserved. The codes documented in this report are preliminary and upon coder review may  be revised to meet current compliance requirements. Ronnette Juniper, MD 08/22/2021 9:28:18 AM This report has been signed electronically. Number of Addenda: 0

## 2021-08-22 NOTE — Transfer of Care (Signed)
Immediate Anesthesia Transfer of Care Note  Patient: Helen [name]  Procedure(s) Performed: Procedure(s): ESOPHAGOGASTRODUODENOSCOPY (EGD) WITH PROPOFOL (N/A)  Patient Location: PACU and Endoscopy Unit  Anesthesia Type:MAC  Level of Consciousness: awake, alert  and oriented  Airway & Oxygen Therapy: Patient Spontanous Breathing and Patient connected to nasal cannula oxygen  Post-op Assessment: Report given to RN and Post -op Vital signs reviewed and stable  Post vital signs: Reviewed and stable  Last Vitals:  Vitals:   08/22/21 0554 08/22/21 0848  BP: (!) 118/49 (!) 126/57  Pulse: 71 75  Resp: 20 18  Temp: 36.9 C 36.7 C  SpO2: 50% 27%    Complications: No apparent anesthesia complications

## 2021-08-23 LAB — COMPREHENSIVE METABOLIC PANEL
ALT: 22 U/L (ref 0–44)
AST: 18 U/L (ref 15–41)
Albumin: 2.8 g/dL — ABNORMAL LOW (ref 3.5–5.0)
Alkaline Phosphatase: 71 U/L (ref 38–126)
Anion gap: 9 (ref 5–15)
BUN: 35 mg/dL — ABNORMAL HIGH (ref 8–23)
CO2: 22 mmol/L (ref 22–32)
Calcium: 8.2 mg/dL — ABNORMAL LOW (ref 8.9–10.3)
Chloride: 101 mmol/L (ref 98–111)
Creatinine, Ser: 1.08 mg/dL — ABNORMAL HIGH (ref 0.44–1.00)
GFR, Estimated: 49 mL/min — ABNORMAL LOW (ref >60)
Glucose, Bld: 119 mg/dL — ABNORMAL HIGH (ref 70–99)
Potassium: 4.1 mmol/L (ref 3.5–5.1)
Sodium: 132 mmol/L — ABNORMAL LOW (ref 135–145)
Total Bilirubin: 1.1 mg/dL (ref 0.3–1.2)
Total Protein: 5.9 g/dL — ABNORMAL LOW (ref 6.5–8.1)

## 2021-08-23 LAB — CBC
HCT: 24.2 % — ABNORMAL LOW (ref 36.0–46.0)
Hemoglobin: 7.7 g/dL — ABNORMAL LOW (ref 12.0–15.0)
MCH: 29.1 pg (ref 26.0–34.0)
MCHC: 31.8 g/dL (ref 30.0–36.0)
MCV: 91.3 fL (ref 80.0–100.0)
Platelets: 166 10*3/uL (ref 150–400)
RBC: 2.65 MIL/uL — ABNORMAL LOW (ref 3.87–5.11)
RDW: 17 % — ABNORMAL HIGH (ref 11.5–15.5)
WBC: 10.1 10*3/uL (ref 4.0–10.5)
nRBC: 0 % (ref 0.0–0.2)

## 2021-08-23 NOTE — Progress Notes (Signed)
PROGRESS NOTE    Helen [name]  WJX:[id number] DOB: [birth year] DOA: 08/20/2021 PCP: Vernie Shanks, MD   Brief Narrative:87 y.o. female with past medical history of atrial fibrillation on Eliquis, chronic combined systolic and diastolic heart failure, hypertension, diabetes, peripheral vascular disease, breast cancer and stroke presented to the ER with a hemoglobin of 5.1. She underwent 2 units of prbc transfusion and repeat hemoglobin improved to 8.0. GI consulted.   Assessment & Plan:   Principal Problem:   Symptomatic anemia Active Problems:   Essential hypertension   Chronic combined systolic and diastolic heart failure (HCC)   Scarring of lung following radiation- RLL   Nonischemic cardiomyopathy (HCC)   Occult blood in stools  Symptomatic anemia/ anemia of acute blood loss:  - stool for occult is positive.  - s/p 3 units of prbc transfusions and repeat hemoglobin is around 7.7 and stable. - GI consulted, on IV PPI BID.  EGD 08/22/2021 by Dr. Therisa Doyne shows normal esophagus, multiple gastric polyps, normal examined duodenum, erythematous mucosa in the stomach.   GI recommends to restart her on regular diet and okay to resume Eliquis, sucralfate 1 g twice daily and to continue PPI.    AKI creatinine 1.08 from 1.36 from 0.93. - lasix on hold.  - recheck renal parameters in am.  -Hold Entresto   Hypertension: Pressure 143/58 on Coreg Well controlled.    Chronic diastolic heart failure.  Echocardiogram showed Left ventricular ejection fraction, by estimation, is 50 to 55%. The left ventricle has low normal function. The left ventricle has no regional  wall motion abnormalities. Left ventricular diastolic parameters are  consistent with Grade II diastolic  dysfunction (pseudonormalization). On [place] oxygen at baseline.  Lasix and entresto on hold due to AKI and dehydration.      PAF:  Rate controlled on coreg ,on eliquis for anti coagulation      H/o stroke:  On  Eliquis.and lipitor.      Hyperlipidemia:  Resume statin.     Hyponatremia: Improving sodium 132. Suspect from volume depletion, dehydration.    Disposition seen by physical therapy recommends SNF.  Patient lives alone and is very weak to go back home.  Estimated body mass index is 25.4 kg/m as calculated from the following:   Height as of this encounter: 5' 2.5" (1.588 m).   Weight as of this encounter: 64 kg.  DVT prophylaxis: scd Code Status DNR Family Communication: Discussed with daughter at bedside.   disposition Plan:  Status is: Inpatient  Remains inpatient appropriate because:  Gi bleed   Consultants:  gi  Procedures: EGD 08/22/2021 Antimicrobials: None  Subjective: She is resting in bed no events overnight Objective: Vitals:   08/22/21 1109 08/22/21 1340 08/22/21 2009 08/23/21 0625  BP: (!) 126/52 (!) 115/41 (!) 143/61 (!) 143/58  Pulse: 69 60 79 76  Resp: 20 14 19 20   Temp:  97.6 F (36.4 C) 98.2 F (36.8 C) 98 F (36.7 C)  TempSrc:  Oral  Oral  SpO2: 94% (!) 87% 97% 95%  Weight:      Height:        Intake/Output Summary (Last 24 hours) at 08/23/2021 1204 Last data filed at 08/23/2021 0908 Gross per 24 hour  Intake 2459.86 ml  Output 400 ml  Net 2059.86 ml    Filed Weights   08/20/21 1222 08/22/21 0848  Weight: 64 kg 64 kg    Examination:  General exam: Appears calm and comfortable  Respiratory system: Clear to  auscultation. Respiratory effort normal. Cardiovascular system: S1 & S2 heard, RRR. No JVD, murmurs, rubs, gallops or clicks. No pedal edema. Gastrointestinal system: Abdomen is nondistended, soft and nontender. No organomegaly or masses felt. Normal bowel sounds heard. Central nervous system: Alert and oriented. No focal neurological deficits. Extremities: Symmetric 5 x 5 power. Skin: No rashes, lesions or ulcers Psychiatry: Judgement and insight appear normal. Mood & affect appropriate.     Data Reviewed: I have personally  reviewed following labs and imaging studies  CBC: Recent Labs  Lab 08/20/21 1249 08/21/21 0415 08/21/21 1758 08/22/21 0444 08/23/21 0505  WBC 9.3 10.0  --  9.8 10.1  NEUTROABS 7.3  --   --   --   --   HGB 5.1* 7.8* 8.1* 8.0* 7.7*  HCT 16.1* 23.1* 23.9* 23.7* 24.2*  MCV 90.4 86.8  --  87.8 91.3  PLT 171 159  --  150 440    Basic Metabolic Panel: Recent Labs  Lab 08/20/21 1249 08/21/21 0415 08/23/21 0505  NA 132* 128* 132*  K 4.2 4.3 4.1  CL 98 98 101  CO2 25 19* 22  GLUCOSE 128* 103* 119*  BUN 40* 42* 35*  CREATININE 0.93 1.36* 1.08*  CALCIUM 8.3* 8.0* 8.2*    GFR: Estimated Creatinine Clearance: 31.4 mL/min (A) (by C-G formula based on SCr of 1.08 mg/dL (H)). Liver Function Tests: Recent Labs  Lab 08/20/21 1249 08/23/21 0505  AST 21 18  ALT 16 22  ALKPHOS 39 71  BILITOT 0.7 1.1  PROT 6.4* 5.9*  ALBUMIN 3.2* 2.8*    No results for input(s): LIPASE, AMYLASE in the last 168 hours. No results for input(s): AMMONIA in the last 168 hours. Coagulation Profile: Recent Labs  Lab 08/20/21 1249  INR 2.4*    Cardiac Enzymes: No results for input(s): CKTOTAL, CKMB, CKMBINDEX, TROPONINI in the last 168 hours. BNP (last 3 results) No results for input(s): PROBNP in the last 8760 hours. HbA1C: No results for input(s): HGBA1C in the last 72 hours. CBG: No results for input(s): GLUCAP in the last 168 hours. Lipid Profile: No results for input(s): CHOL, HDL, LDLCALC, TRIG, CHOLHDL, LDLDIRECT in the last 72 hours. Thyroid Function Tests: No results for input(s): TSH, T4TOTAL, FREET4, T3FREE, THYROIDAB in the last 72 hours. Anemia Panel: No results for input(s): VITAMINB12, FOLATE, FERRITIN, TIBC, IRON, RETICCTPCT in the last 72 hours. Sepsis Labs: Recent Labs  Lab 08/20/21 1249  LATICACIDVEN 0.9     Recent Results (from the past 240 hour(s))  Resp Panel by RT-PCR (Flu A&B, Covid) Nasopharyngeal Swab     Status: None   Collection Time: 08/20/21  2:41 PM    Specimen: Nasopharyngeal Swab; Nasopharyngeal(NP) swabs in vial transport medium  Result Value Ref Range Status   SARS Coronavirus 2 by RT PCR NEGATIVE NEGATIVE Final    Comment: (NOTE) SARS-CoV-2 target nucleic acids are NOT DETECTED.  The SARS-CoV-2 RNA is generally detectable in upper respiratory specimens during the acute phase of infection. The lowest concentration of SARS-CoV-2 viral copies this assay can detect is 138 copies/mL. A negative result does not preclude SARS-Cov-2 infection and should not be used as the sole basis for treatment or other patient management decisions. A negative result may occur with  improper specimen collection/handling, submission of specimen other than nasopharyngeal swab, presence of viral mutation(s) within the areas targeted by this assay, and inadequate number of viral copies(<138 copies/mL). A negative result must be combined with clinical observations, patient history, and epidemiological information.  The expected result is Negative.  Fact Sheet for Patients:  EntrepreneurPulse.com.au  Fact Sheet for Healthcare Providers:  IncredibleEmployment.be  This test is no t yet approved or cleared by the [place] FDA and  has been authorized for detection and/or diagnosis of SARS-CoV-2 by FDA under an Emergency Use Authorization (EUA). This EUA will remain  in effect (meaning this test can be used) for the duration of the COVID-19 declaration under Section 564(b)(1) of the Act, 21 U.S.C.section 360bbb-3(b)(1), unless the authorization is terminated  or revoked sooner.       Influenza A by PCR NEGATIVE NEGATIVE Final   Influenza B by PCR NEGATIVE NEGATIVE Final    Comment: (NOTE) The Xpert Xpress SARS-CoV-2/FLU/RSV plus assay is intended as an aid in the diagnosis of influenza from Nasopharyngeal swab specimens and should not be used as a sole basis for treatment. Nasal washings and aspirates are  unacceptable for Xpert Xpress SARS-CoV-2/FLU/RSV testing.  Fact Sheet for Patients: EntrepreneurPulse.com.au  Fact Sheet for Healthcare Providers: IncredibleEmployment.be  This test is not yet approved or cleared by the [place] FDA and has been authorized for detection and/or diagnosis of SARS-CoV-2 by FDA under an Emergency Use Authorization (EUA). This EUA will remain in effect (meaning this test can be used) for the duration of the COVID-19 declaration under Section 564(b)(1) of the Act, 21 U.S.C. section 360bbb-3(b)(1), unless the authorization is terminated or revoked.  Performed at [facility], [place] [address], [place], [place] [address]           Radiology Studies: No results found.      Scheduled Meds:  apixaban  5 mg Oral BID   vitamin C  500 mg Oral Daily   atorvastatin  80 mg Oral QHS   carvedilol  12.5 mg Oral BID WC   cholecalciferol  1,000 Units Oral QPM   magnesium oxide  400 mg Oral Daily   multivitamin with minerals  1 tablet Oral Daily   pantoprazole  40 mg Oral BID AC   sucralfate  1 g Oral BID WC   Continuous Infusions:  sodium chloride Stopped (08/20/21 1749)   sodium chloride 50 mL/hr at 08/23/21 1107     LOS: 3 days    Time spent: 50 min   Georgette Shell, MD  08/23/2021, 12:04 PM

## 2021-08-23 NOTE — Progress Notes (Signed)
Patient is no longer taking Lipitor and wants it discontinue from her medication profile.

## 2021-08-24 LAB — BASIC METABOLIC PANEL
Anion gap: 7 (ref 5–15)
BUN: 28 mg/dL — ABNORMAL HIGH (ref 8–23)
CO2: 23 mmol/L (ref 22–32)
Calcium: 8.2 mg/dL — ABNORMAL LOW (ref 8.9–10.3)
Chloride: 103 mmol/L (ref 98–111)
Creatinine, Ser: 1.06 mg/dL — ABNORMAL HIGH (ref 0.44–1.00)
GFR, Estimated: 50 mL/min — ABNORMAL LOW (ref >60)
Glucose, Bld: 128 mg/dL — ABNORMAL HIGH (ref 70–99)
Potassium: 4 mmol/L (ref 3.5–5.1)
Sodium: 133 mmol/L — ABNORMAL LOW (ref 135–145)

## 2021-08-24 LAB — TYPE AND SCREEN
ABO/RH(D): O POS
Antibody Screen: NEGATIVE
Unit division: 0
Unit division: 0
Unit division: 0

## 2021-08-24 LAB — BPAM RBC
Blood Product Expiration Date: 202301242359
Blood Product Expiration Date: 202301242359
Blood Product Expiration Date: 202301252359
ISSUE DATE / TIME: 202212281716
ISSUE DATE / TIME: 202212282324
Unit Type and Rh: 5100
Unit Type and Rh: 5100
Unit Type and Rh: 5100

## 2021-08-24 LAB — CBC
HCT: 24.5 % — ABNORMAL LOW (ref 36.0–46.0)
Hemoglobin: 7.8 g/dL — ABNORMAL LOW (ref 12.0–15.0)
MCH: 29 pg (ref 26.0–34.0)
MCHC: 31.8 g/dL (ref 30.0–36.0)
MCV: 91.1 fL (ref 80.0–100.0)
Platelets: 153 10*3/uL (ref 150–400)
RBC: 2.69 MIL/uL — ABNORMAL LOW (ref 3.87–5.11)
RDW: 17.1 % — ABNORMAL HIGH (ref 11.5–15.5)
WBC: 9.2 10*3/uL (ref 4.0–10.5)
nRBC: 0 % (ref 0.0–0.2)

## 2021-08-24 NOTE — Progress Notes (Signed)
PROGRESS NOTE    Helen [name]  CXK:[id number] DOB: 01/24/1930 DOA: 08/20/2021 PCP: Vernie Shanks, MD   Brief Narrative:88 y.o. female with past medical history of atrial fibrillation on Eliquis, chronic combined systolic and diastolic heart failure, hypertension, diabetes, peripheral vascular disease, breast cancer and stroke presented to the ER with a hemoglobin of 5.1. She underwent 2 units of prbc transfusion and repeat hemoglobin improved to 8.0. GI consulted.   Assessment & Plan:   Principal Problem:   Symptomatic anemia Active Problems:   Essential hypertension   Chronic combined systolic and diastolic heart failure (HCC)   Scarring of lung following radiation- RLL   Nonischemic cardiomyopathy (HCC)   Occult blood in stools  Symptomatic anemia/ anemia of acute blood loss:  - stool for occult is positive.  - s/p 3 units of prbc transfusions and repeat hemoglobin is around 7.8 and stable. - GI consulted.  EGD 08/22/2021 by Dr. Therisa Doyne shows normal esophagus, multiple gastric polyps, normal examined duodenum, erythematous mucosa in the stomach.   GI recommends to restart her on regular diet and okay to resume Eliquis, sucralfate 1 g twice daily and to continue PPI.    AKI creatinine 1.06 from 1.36 from 0.93. - lasix on hold.  - recheck renal parameters in am.  -Hold Entresto   Hypertension: Pressure 157/66  on Coreg Well controlled.    Chronic diastolic heart failure.  Echocardiogram showed Left ventricular ejection fraction, by estimation, is 50 to 55%. The left ventricle has low normal function. The left ventricle has no regional  wall motion abnormalities. Left ventricular diastolic parameters are  consistent with Grade II diastolic  dysfunction (pseudonormalization). On [place] oxygen at baseline.  Lasix and entresto on hold due to AKI and dehydration.      PAF:  Rate controlled on coreg ,on eliquis for anti coagulation      H/o stroke:  On Eliquis.and lipitor.       Hyperlipidemia: she does not want to continue statin   Hyponatremia: Improving sodium 133. Suspect from volume depletion, dehydration.    Disposition seen by physical therapy recommends SNF.  Patient lives alone and is very weak to go back home.  Estimated body mass index is 25.4 kg/m as calculated from the following:   Height as of this encounter: 5' 2.5" (1.588 m).   Weight as of this encounter: 64 kg.  DVT prophylaxis: scd Code Status DNR Family Communication: Discussed with daughter at bedside.   disposition Plan:  Status is: Inpatient  Remains inpatient appropriate because:  Gi bleed   Consultants:  gi  Procedures: EGD 08/22/2021 Antimicrobials: None  Subjective:  She is resting in bed in nad No new c/o no chest pain No shortness of breath  Objective: Vitals:   08/23/21 2134 08/23/21 2149 08/24/21 0547 08/24/21 0737  BP: (!) 82/51 134/77 110/75 (!) 157/66  Pulse: 81 81 86 79  Resp: 16 15 14 20   Temp: 98.7 F (37.1 C)  98.7 F (37.1 C)   TempSrc: Oral  Oral   SpO2: 96%  95% 99%  Weight:      Height:        Intake/Output Summary (Last 24 hours) at 08/24/2021 1156 Last data filed at 08/24/2021 1000 Gross per 24 hour  Intake 660 ml  Output 0 ml  Net 660 ml    Filed Weights   08/20/21 1222 08/22/21 0848  Weight: 64 kg 64 kg    Examination:  General exam: Appears calm and comfortable  Respiratory system: Clear to auscultation. Respiratory effort normal. Cardiovascular system: S1 & S2 heard, RRR. No JVD, murmurs, rubs, gallops or clicks. No pedal edema. Gastrointestinal system: Abdomen is nondistended, soft and nontender. No organomegaly or masses felt. Normal bowel sounds heard. Central nervous system: Alert and oriented. No focal neurological deficits. Extremities: Symmetric 5 x 5 power. Skin: No rashes, lesions or ulcers Psychiatry: Judgement and insight appear normal. Mood & affect appropriate.     Data Reviewed: I have personally reviewed  following labs and imaging studies  CBC: Recent Labs  Lab 08/20/21 1249 08/21/21 0415 08/21/21 1758 08/22/21 0444 08/23/21 0505 08/24/21 0459  WBC 9.3 10.0  --  9.8 10.1 9.2  NEUTROABS 7.3  --   --   --   --   --   HGB 5.1* 7.8* 8.1* 8.0* 7.7* 7.8*  HCT 16.1* 23.1* 23.9* 23.7* 24.2* 24.5*  MCV 90.4 86.8  --  87.8 91.3 91.1  PLT 171 159  --  150 166 938    Basic Metabolic Panel: Recent Labs  Lab 08/20/21 1249 08/21/21 0415 08/23/21 0505 08/24/21 0459  NA 132* 128* 132* 133*  K 4.2 4.3 4.1 4.0  CL 98 98 101 103  CO2 25 19* 22 23  GLUCOSE 128* 103* 119* 128*  BUN 40* 42* 35* 28*  CREATININE 0.93 1.36* 1.08* 1.06*  CALCIUM 8.3* 8.0* 8.2* 8.2*    GFR: Estimated Creatinine Clearance: 32 mL/min (A) (by C-G formula based on SCr of 1.06 mg/dL (H)). Liver Function Tests: Recent Labs  Lab 08/20/21 1249 08/23/21 0505  AST 21 18  ALT 16 22  ALKPHOS 39 71  BILITOT 0.7 1.1  PROT 6.4* 5.9*  ALBUMIN 3.2* 2.8*    No results for input(s): LIPASE, AMYLASE in the last 168 hours. No results for input(s): AMMONIA in the last 168 hours. Coagulation Profile: Recent Labs  Lab 08/20/21 1249  INR 2.4*    Cardiac Enzymes: No results for input(s): CKTOTAL, CKMB, CKMBINDEX, TROPONINI in the last 168 hours. BNP (last 3 results) No results for input(s): PROBNP in the last 8760 hours. HbA1C: No results for input(s): HGBA1C in the last 72 hours. CBG: No results for input(s): GLUCAP in the last 168 hours. Lipid Profile: No results for input(s): CHOL, HDL, LDLCALC, TRIG, CHOLHDL, LDLDIRECT in the last 72 hours. Thyroid Function Tests: No results for input(s): TSH, T4TOTAL, FREET4, T3FREE, THYROIDAB in the last 72 hours. Anemia Panel: No results for input(s): VITAMINB12, FOLATE, FERRITIN, TIBC, IRON, RETICCTPCT in the last 72 hours. Sepsis Labs: Recent Labs  Lab 08/20/21 1249  LATICACIDVEN 0.9     Recent Results (from the past 240 hour(s))  Resp Panel by RT-PCR (Flu A&B,  Covid) Nasopharyngeal Swab     Status: None   Collection Time: 08/20/21  2:41 PM   Specimen: Nasopharyngeal Swab; Nasopharyngeal(NP) swabs in vial transport medium  Result Value Ref Range Status   SARS Coronavirus 2 by RT PCR NEGATIVE NEGATIVE Final    Comment: (NOTE) SARS-CoV-2 target nucleic acids are NOT DETECTED.  The SARS-CoV-2 RNA is generally detectable in upper respiratory specimens during the acute phase of infection. The lowest concentration of SARS-CoV-2 viral copies this assay can detect is 138 copies/mL. A negative result does not preclude SARS-Cov-2 infection and should not be used as the sole basis for treatment or other patient management decisions. A negative result may occur with  improper specimen collection/handling, submission of specimen other than nasopharyngeal swab, presence of viral mutation(s) within the areas targeted  by this assay, and inadequate number of viral copies(<138 copies/mL). A negative result must be combined with clinical observations, patient history, and epidemiological information. The expected result is Negative.  Fact Sheet for Patients:  EntrepreneurPulse.com.au  Fact Sheet for Healthcare Providers:  IncredibleEmployment.be  This test is no t yet approved or cleared by the [place] FDA and  has been authorized for detection and/or diagnosis of SARS-CoV-2 by FDA under an Emergency Use Authorization (EUA). This EUA will remain  in effect (meaning this test can be used) for the duration of the COVID-19 declaration under Section 564(b)(1) of the Act, 21 U.S.C.section 360bbb-3(b)(1), unless the authorization is terminated  or revoked sooner.       Influenza A by PCR NEGATIVE NEGATIVE Final   Influenza B by PCR NEGATIVE NEGATIVE Final    Comment: (NOTE) The Xpert Xpress SARS-CoV-2/FLU/RSV plus assay is intended as an aid in the diagnosis of influenza from Nasopharyngeal swab specimens and should  not be used as a sole basis for treatment. Nasal washings and aspirates are unacceptable for Xpert Xpress SARS-CoV-2/FLU/RSV testing.  Fact Sheet for Patients: EntrepreneurPulse.com.au  Fact Sheet for Healthcare Providers: IncredibleEmployment.be  This test is not yet approved or cleared by the [place] FDA and has been authorized for detection and/or diagnosis of SARS-CoV-2 by FDA under an Emergency Use Authorization (EUA). This EUA will remain in effect (meaning this test can be used) for the duration of the COVID-19 declaration under Section 564(b)(1) of the Act, 21 U.S.C. section 360bbb-3(b)(1), unless the authorization is terminated or revoked.  Performed at [facility], [place] [address], [place], [place] [address]           Radiology Studies: No results found.      Scheduled Meds:  apixaban  5 mg Oral BID   vitamin C  500 mg Oral Daily   atorvastatin  80 mg Oral QHS   carvedilol  12.5 mg Oral BID WC   cholecalciferol  1,000 Units Oral QPM   magnesium oxide  400 mg Oral Daily   multivitamin with minerals  1 tablet Oral Daily   pantoprazole  40 mg Oral BID AC   sucralfate  1 g Oral BID WC   Continuous Infusions:  sodium chloride Stopped (08/20/21 1749)     LOS: 4 days    Time spent: 39 min   Georgette Shell, MD  08/24/2021, 11:56 AM

## 2021-08-25 LAB — RESP PANEL BY RT-PCR (FLU A&B, COVID) ARPGX2
Influenza A by PCR: NEGATIVE
Influenza B by PCR: NEGATIVE
SARS Coronavirus 2 by RT PCR: NEGATIVE

## 2021-08-25 MED ORDER — SACUBITRIL-VALSARTAN 49-51 MG PO TABS
1.0000 | ORAL_TABLET | Freq: Two times a day (BID) | ORAL | Status: DC
  Administered 2021-08-25 – 2021-08-26 (×4): 1 via ORAL
  Filled 2021-08-25 (×4): qty 1

## 2021-08-25 NOTE — NC FL2 (Signed)
[place] LEVEL OF CARE SCREENING TOOL     IDENTIFICATION  Patient Name: Helen [name] Birthdate: [birth year] Sex: female Admission Date (Current Location): 08/20/2021  [facility] and [place] Number:  Herbalist and Address:  [facility],  [place], [place]      Provider Number: [id number]  Attending Physician Name and Address:  Georgette Shell, MD  Relative Name and Phone Number:  son, Cathryne Mancebo @ [phone]    Current Level of Care: Hospital Recommended Level of Care: [place] Prior Approval Number:    Date Approved/Denied:   PASRR Number: [id number]  Discharge Plan: SNF    Current Diagnoses: Patient Active Problem List   Diagnosis Date Noted   Symptomatic anemia 08/20/2021   Occult blood in stools 08/20/2021   COVID-19 01/04/2021   CAP (community acquired pneumonia) 01/03/2021   Nonischemic cardiomyopathy ([place]) 12/08/2016   SOB (shortness of breath) 11/12/2016   Shortness of breath 11/11/2016   Chronic respiratory failure with hypoxia ([place]) 11/11/2016   Anemia of chronic disease 03/24/2016   Chronic combined systolic and diastolic heart failure ([place]) 03/24/2016   Acute respiratory failure with hypoxia ([place]) 03/24/2016   Scarring of lung following radiation- RLL 03/24/2016   Essential hypertension    Acute encephalopathy 12/13/2015   Hyponatremia 12/13/2015   Hypokalemia 12/13/2015   UTI (lower urinary tract infection) 12/13/2015   Leukocytosis 12/13/2015   Hypercholesteremia    Diabetes mellitus without complication ([place])    History of stroke    Peripheral vascular disease ([place])    Carotid stenosis 03/13/2015    Orientation RESPIRATION BLADDER Height & Weight     Self, Time, Situation, Place  O2 Continent Weight: 141 lb 1.5 oz (64 kg) Height:  5' 2.5" (158.8 cm)  BEHAVIORAL SYMPTOMS/MOOD NEUROLOGICAL BOWEL NUTRITION STATUS      Continent    AMBULATORY STATUS COMMUNICATION  OF NEEDS Skin   Limited Assist Verbally Normal                       Personal Care Assistance Level of Assistance  Bathing, Dressing Bathing Assistance: Limited assistance   Dressing Assistance: Limited assistance     Functional Limitations Info             SPECIAL CARE FACTORS FREQUENCY  PT (By licensed PT), OT (By licensed OT)     PT Frequency: 5x/wk OT Frequency: 5x/wk            Contractures Contractures Info: Not present    Additional Factors Info  Code Status, Allergies Code Status Info: DNR Allergies Info: Morphine And Related, Other, Sulfa Antibiotics, Sulfasalazine, Nickel           Current Medications (08/25/2021):  This is the current hospital active medication list Current Facility-Administered Medications  Medication Dose Route Frequency Provider Last Rate Last Admin   0.9 %  sodium chloride infusion  10 mL/hr Intravenous Once Ronnette Juniper, MD   Held at 08/20/21 1749   acetaminophen (TYLENOL) tablet 650 mg  650 mg Oral Q6H PRN Ronnette Juniper, MD       Or   acetaminophen (TYLENOL) suppository 650 mg  650 mg Rectal Q6H PRN Ronnette Juniper, MD       apixaban Arne [place]) tablet 5 mg  5 mg Oral BID Ronnette Juniper, MD   5 mg at 08/25/21 0254   ascorbic acid (VITAMIN C) tablet 500 mg  500 mg Oral Daily Ronnette Juniper, MD  500 mg at 08/25/21 8206   bismuth subsalicylate (PEPTO BISMOL) chewable tablet 524 mg  524 mg Oral PRN Ronnette Juniper, MD       carvedilol (COREG) tablet 12.5 mg  12.5 mg Oral BID WC Georgette Shell, MD   12.5 mg at 08/25/21 0745   cholecalciferol (VITAMIN D3) tablet 1,000 Units  1,000 Units Oral QPM Ronnette Juniper, MD   1,000 Units at 08/24/21 1657   magnesium oxide (MAG-OX) tablet 400 mg  400 mg Oral Daily Ronnette Juniper, MD   400 mg at 08/25/21 0156   multivitamin with minerals tablet 1 tablet  1 tablet Oral Daily Ronnette Juniper, MD   1 tablet at 08/25/21 0909   ondansetron (ZOFRAN) tablet 4 mg  4 mg Oral Q6H PRN Ronnette Juniper, MD       Or   ondansetron  [facility]) injection 4 mg  4 mg Intravenous Q6H PRN Ronnette Juniper, MD       pantoprazole (PROTONIX) EC tablet 40 mg  40 mg Oral BID AC Ronnette Juniper, MD   40 mg at 08/25/21 0745   senna-docusate (Senokot-S) tablet 2 tablet  2 tablet Oral QHS PRN Ronnette Juniper, MD       sucralfate (CARAFATE) tablet 1 g  1 g Oral BID WC Ronnette Juniper, MD   1 g at 08/25/21 0745     Discharge Medications: Please see discharge summary for a list of discharge medications.  Relevant Imaging Results:  Relevant Lab Results:   Additional Information SS#[id number]  Lennart Pall, LCSW

## 2021-08-25 NOTE — Progress Notes (Signed)
Physical Therapy Treatment Patient Details Name: Helen [name] MRN: [id number] DOB: 04/27/1930 Today's Date: 08/25/2021   History of Present Illness 88 y.o. female with past medical history of atrial fibrillation on Eliquis, chronic combined systolic and diastolic heart failure, hypertension, diabetes, peripheral vascular disease, breast cancer and stroke presented to the ER with a hemoglobin of 5.1. Dx of anemia. GI workup in progress.    PT Comments    Pt progressing toward goals. Increasing activity tolerance however continues to  fatigue quickly. Will benefit from SNF post acute   Recommendations for follow up therapy are one component of a multi-disciplinary discharge planning process, led by the attending physician.  Recommendations may be updated based on patient status, additional functional criteria and insurance authorization.  Follow Up Recommendations  Skilled nursing-short term rehab (<3 hours/day)     Assistance Recommended at Discharge Frequent or constant Supervision/Assistance  Equipment Recommendations  None recommended by PT    Recommendations for Other Services       Precautions / Restrictions Precautions Precautions: Fall Precaution Comments: found herself on the floor just PTA, can't recall how she got there Restrictions Weight Bearing Restrictions: No     Mobility  Bed Mobility Overal bed mobility: Modified Independent                  Transfers Overall transfer level: Needs assistance Equipment used: Rolling walker (2 wheels) Transfers: Sit to/from Stand Sit to Stand: Min guard           General transfer comment: cues for hand placement    Ambulation/Gait Ambulation/Gait assistance: Min guard;Min assist Gait Distance (Feet): 25 Feet Assistive device: Rolling walker (2 wheels) Gait Pattern/deviations: Step-through pattern;Decreased stride length       General Gait Details: in room distance, fatigues easily. min to min/guard for  balance and safety maneuvering in tighter spaces of room   Stairs             Wheelchair Mobility    Modified Rankin (Stroke Patients Only)       Balance   Sitting-balance support: Feet supported;No upper extremity supported Sitting balance-Leahy Scale: Good     Standing balance support: Bilateral upper extremity supported;Reliant on assistive device for balance;During functional activity Standing balance-Leahy Scale: Poor Standing balance comment: relies on BUE support, can release walker in static standing                            Cognition Arousal/Alertness: Awake/alert Behavior During Therapy: WFL for tasks assessed/performed Overall Cognitive Status: Within Functional Limits for tasks assessed                                 General Comments: pt reports she is on 1L 02 at home        Exercises General Exercises - Lower Extremity Long Arc Quad: AROM;Both;10 reps;Seated Toe Raises: AROM;Both;Seated Heel Raises: AROM;Both;10 reps    General Comments        Pertinent Vitals/Pain Pain Assessment: No/denies pain    Home Living                          Prior Function            PT Goals (current goals can now be found in the care plan section) Acute Rehab PT Goals Patient Stated Goal: to get stronger PT  Goal Formulation: With patient Time For Goal Achievement: 09/05/21 Potential to Achieve Goals: Good Progress towards PT goals: Progressing toward goals    Frequency    Min 3X/week      PT Plan Current plan remains appropriate    Co-evaluation              AM-PAC PT "6 Clicks" Mobility   Outcome Measure  Help needed turning from your back to your side while in a flat bed without using bedrails?: A Little Help needed moving from lying on your back to sitting on the side of a flat bed without using bedrails?: A Little Help needed moving to and from a bed to a chair (including a wheelchair)?: A  Little Help needed standing up from a chair using your arms (e.g., wheelchair or bedside chair)?: A Little Help needed to walk in hospital room?: A Little Help needed climbing 3-5 steps with a railing? : A Lot 6 Click Score: 17    End of Session Equipment Utilized During Treatment: Gait belt Activity Tolerance: Patient tolerated treatment well;Patient limited by fatigue Patient left: with call bell/phone within reach;with bed alarm set;in bed   PT Visit Diagnosis: Difficulty in walking, not elsewhere classified (R26.2)     Time: 1000-1018 PT Time Calculation (min) (ACUTE ONLY): 18 min  Charges:  $Gait Training: 8-22 mins                     Baxter Flattery, PT  Acute Rehab Dept ([place]) [phone] Pager [phone]  08/25/2021    [facility] 08/25/2021, 12:04 PM

## 2021-08-25 NOTE — TOC Initial Note (Signed)
Transition of Care [facility]) - Initial/Assessment Note    Patient Details  Name: Helen [name] MRN: [id number] Date of Birth: 10-09-1929  Transition of Care [facility]) CM/SW Contact:    Lennart Pall, LCSW Phone Number: 08/25/2021, 1:03 PM  Clinical Narrative:                 Met with pt and son, Linna Hoff, today to introduce self/ TOC role with dc planning.  Both very gracious and engaged.  Pt confirms that she has been a resident at [facility] for ~ 13 yrs and has required short term SNF a couple of times over those years.  Both are aware that SNF is recommended again and they are agreeable.  Pt is hopeful she might be able to return to [facility].  Will begin bed search.  Expected Discharge Plan: [place] Barriers to Discharge: Continued Medical Work up, SNF Pending bed offer   Patient Goals and CMS Choice Patient states their goals for this hospitalization and ongoing recovery are:: short term SNF and then return to [place]      Expected Discharge Plan and Services Expected Discharge Plan: [place] In-house Referral: Clinical Social Work   Post Acute Care Choice: [place] Living arrangements for the past 2 months: [place] ([employer])                                      Prior Living Arrangements/Services Living arrangements for the past 2 months: [place] ([employer]) Lives with:: Facility Resident Patient language and need for interpreter reviewed:: Yes Do you feel safe going back to the place where you live?: Yes      Need for Family Participation in Patient Care: No (Comment) Care giver support system in place?: Yes (comment)   Criminal Activity/Legal Involvement Pertinent to Current Situation/Hospitalization: No - Comment as needed  Activities of Daily Living Home Assistive Devices/Equipment: Eyeglasses, Environmental consultant (specify type), Oxygen (rolator) ADL Screening  (condition at time of admission) Patient's cognitive ability adequate to safely complete daily activities?: No Is the patient deaf or have difficulty hearing?: Yes Does the patient have difficulty seeing, even when wearing glasses/contacts?: Yes Does the patient have difficulty concentrating, remembering, or making decisions?: Yes Patient able to express need for assistance with ADLs?: Yes Does the patient have difficulty dressing or bathing?: Yes Independently performs ADLs?: No Communication: Independent Dressing (OT): Needs assistance Is this a change from baseline?: Change from baseline, expected to last >3 days Grooming: Independent Feeding: Independent Bathing: Needs assistance Is this a change from baseline?: Change from baseline, expected to last >3 days Toileting: Needs assistance Is this a change from baseline?: Change from baseline, expected to last >3days In/Out Bed: Needs assistance Is this a change from baseline?: Change from baseline, expected to last >3 days Walks in Home: Needs assistance Is this a change from baseline?: Change from baseline, expected to last >3 days Does the patient have difficulty walking or climbing stairs?: Yes Weakness of Legs: Both Weakness of Arms/Hands: Both  Permission Sought/Granted Permission sought to share information with : Family Supports Permission granted to share information with : Yes, Verbal Permission Granted  Share Information with NAME: Addaleigh Nicholls     Permission granted to share info w Relationship: son  Permission granted to share info w Contact Information: [phone]  Emotional Assessment Appearance:: Appears stated age Attitude/Demeanor/Rapport: Engaged,  Gracious Affect (typically observed): Accepting, Pleasant Orientation: : Oriented to Self, Oriented to Place, Oriented to  Time, Oriented to Situation Alcohol / Substance Use: Not Applicable Psych Involvement: No (comment)  Admission diagnosis:  GI bleed  [K92.2] General weakness [R53.1] Anticoagulated [Z79.01] Symptomatic anemia [D64.9] Patient Active Problem List   Diagnosis Date Noted   Symptomatic anemia 08/20/2021   Occult blood in stools 08/20/2021   COVID-19 01/04/2021   CAP (community acquired pneumonia) 01/03/2021   Nonischemic cardiomyopathy ([place]) 12/08/2016   SOB (shortness of breath) 11/12/2016   Shortness of breath 11/11/2016   Chronic respiratory failure with hypoxia ([place]) 11/11/2016   Anemia of chronic disease 03/24/2016   Chronic combined systolic and diastolic heart failure ([place]) 03/24/2016   Acute respiratory failure with hypoxia ([place]) 03/24/2016   Scarring of lung following radiation- RLL 03/24/2016   Essential hypertension    Acute encephalopathy 12/13/2015   Hyponatremia 12/13/2015   Hypokalemia 12/13/2015   UTI (lower urinary tract infection) 12/13/2015   Leukocytosis 12/13/2015   Hypercholesteremia    Diabetes mellitus without complication ([place])    History of stroke    Peripheral vascular disease ([place])    Carotid stenosis 03/13/2015   PCP:  Vernie Shanks, MD Pharmacy:   [facility] [id number] - [place], [place] [address] [place] [address] Phone: [phone] Fax: [phone]  [facility] ([place], [place] [address] [place] [address] Phone: [phone] Fax: [phone]  [facility] ([facility] ) - [place], [place] - [address] [place] [address] Phone: [phone] Fax: [phone]     Social Determinants of Health (SDOH) Interventions    Readmission Risk Interventions Readmission Risk Prevention Plan 08/22/2021 08/21/2021  Transportation Screening Complete Complete  PCP or Specialist Appt within 5-7 Days - Complete  PCP or Specialist Appt within 3-5 Days Complete -  Home Care Screening - Complete   Medication Review (RN CM) - Complete  HRI or Home Care Consult Complete -  Social Work Consult for Recovery Care Planning/Counseling Complete -  Palliative Care Screening Complete -  Medication Review Press photographer) Complete -  Some recent data might be hidden

## 2021-08-25 NOTE — Plan of Care (Signed)
  Problem: Coping: Goal: Level of anxiety will decrease Outcome: Progressing   Problem: Pain Managment: Goal: General experience of comfort will improve Outcome: Progressing   

## 2021-08-25 NOTE — Progress Notes (Signed)
PROGRESS NOTE    Helen [name]  FBP:[id number] DOB: 03-08-1930 DOA: 08/20/2021 PCP: Vernie Shanks, MD   Brief Narrative:88 y.o. female with past medical history of atrial fibrillation on Eliquis, chronic combined systolic and diastolic heart failure, hypertension, diabetes, peripheral vascular disease, breast cancer and stroke presented to the ER with a hemoglobin of 5.1. She underwent 2 units of prbc transfusion and repeat hemoglobin improved to 8.0. GI consulted.   Assessment & Plan:   Principal Problem:   Symptomatic anemia Active Problems:   Essential hypertension   Chronic combined systolic and diastolic heart failure (HCC)   Scarring of lung following radiation- RLL   Nonischemic cardiomyopathy (HCC)   Occult blood in stools  Symptomatic anemia/ anemia of acute blood loss:  - stool for occult is positive.  - s/p 3 units of prbc transfusions and repeat hemoglobin is around 7.8 and stable. EGD 08/22/2021 by Dr. Therisa Doyne shows normal esophagus, multiple gastric polyps, normal examined duodenum, erythematous mucosa in the stomach.   Tolerating regular diet  resumed Eliquis sucralfate 1 g twice daily and to continue PPI.    AKI creatinine 1.06 from 1.36 from 0.93. Restart entresto - lasix on hold.    Hypertension: Pressure 134/43  on Coreg Well controlled.    Chronic diastolic heart failure.  Echocardiogram showed Left ventricular ejection fraction, by estimation, is 50 to 55%. The left ventricle has low normal function. The left ventricle has no regional  wall motion abnormalities. Left ventricular diastolic parameters are  consistent with Grade II diastolic  dysfunction (pseudonormalization). On [place] oxygen at baseline.     PAF:  Rate controlled on coreg ,on eliquis for anti coagulation    H/o stroke:  On Eliquis.and lipitor.    Hyperlipidemia: she does not want to continue statin   Hyponatremia: Improving sodium 133. Suspect from volume depletion, dehydration.     Disposition seen by physical therapy recommends SNF.  Patient lives alone and is very weak to go back home.check covid  Estimated body mass index is 25.4 kg/m as calculated from the following:   Height as of this encounter: 5' 2.5" (1.588 m).   Weight as of this encounter: 64 kg.  DVT prophylaxis: scd Code Status DNR Family Communication: Discussed with daughter at bedside.   disposition Plan:  Status is: Inpatient  Remains inpatient appropriate because:  Gi bleed   Consultants:  gi  Procedures: EGD 08/22/2021 Antimicrobials: None  Subjective:  She is awake alert just sitting down to eat breakfast anxious to start therapy denies any chest pain shortness of breath  Objective: Vitals:   08/24/21 0737 08/24/21 1420 08/24/21 2138 08/25/21 1325  BP: (!) 157/66 119/89 (!) 104/56 (!) 134/43  Pulse: 79 75 68 78  Resp: 20 18 16 14   Temp:  97.9 F (36.6 C) 99.3 F (37.4 C) 98.7 F (37.1 C)  TempSrc:  Oral Oral Oral  SpO2: 99% 96% 99% 98%  Weight:      Height:        Intake/Output Summary (Last 24 hours) at 08/25/2021 1344 Last data filed at 08/25/2021 1329 Gross per 24 hour  Intake 1140 ml  Output 601 ml  Net 539 ml    Filed Weights   08/20/21 1222 08/22/21 0848  Weight: 64 kg 64 kg    Examination:  General exam: Appears calm and comfortable  Respiratory system: Clear to auscultation. Respiratory effort normal. Cardiovascular system: S1 & S2 heard, RRR. No JVD, murmurs, rubs, gallops or clicks. No pedal edema.  Gastrointestinal system: Abdomen is nondistended, soft and nontender. No organomegaly or masses felt. Normal bowel sounds heard. Central nervous system: Alert and oriented. No focal neurological deficits. Extremities: Symmetric 5 x 5 power. Skin: No rashes, lesions or ulcers Psychiatry: Judgement and insight appear normal. Mood & affect appropriate.     Data Reviewed: I have personally reviewed following labs and imaging studies  CBC: Recent Labs   Lab 08/20/21 1249 08/21/21 0415 08/21/21 1758 08/22/21 0444 08/23/21 0505 08/24/21 0459  WBC 9.3 10.0  --  9.8 10.1 9.2  NEUTROABS 7.3  --   --   --   --   --   HGB 5.1* 7.8* 8.1* 8.0* 7.7* 7.8*  HCT 16.1* 23.1* 23.9* 23.7* 24.2* 24.5*  MCV 90.4 86.8  --  87.8 91.3 91.1  PLT 171 159  --  150 166 633    Basic Metabolic Panel: Recent Labs  Lab 08/20/21 1249 08/21/21 0415 08/23/21 0505 08/24/21 0459  NA 132* 128* 132* 133*  K 4.2 4.3 4.1 4.0  CL 98 98 101 103  CO2 25 19* 22 23  GLUCOSE 128* 103* 119* 128*  BUN 40* 42* 35* 28*  CREATININE 0.93 1.36* 1.08* 1.06*  CALCIUM 8.3* 8.0* 8.2* 8.2*    GFR: Estimated Creatinine Clearance: 32 mL/min (A) (by C-G formula based on SCr of 1.06 mg/dL (H)). Liver Function Tests: Recent Labs  Lab 08/20/21 1249 08/23/21 0505  AST 21 18  ALT 16 22  ALKPHOS 39 71  BILITOT 0.7 1.1  PROT 6.4* 5.9*  ALBUMIN 3.2* 2.8*    No results for input(s): LIPASE, AMYLASE in the last 168 hours. No results for input(s): AMMONIA in the last 168 hours. Coagulation Profile: Recent Labs  Lab 08/20/21 1249  INR 2.4*    Cardiac Enzymes: No results for input(s): CKTOTAL, CKMB, CKMBINDEX, TROPONINI in the last 168 hours. BNP (last 3 results) No results for input(s): PROBNP in the last 8760 hours. HbA1C: No results for input(s): HGBA1C in the last 72 hours. CBG: No results for input(s): GLUCAP in the last 168 hours. Lipid Profile: No results for input(s): CHOL, HDL, LDLCALC, TRIG, CHOLHDL, LDLDIRECT in the last 72 hours. Thyroid Function Tests: No results for input(s): TSH, T4TOTAL, FREET4, T3FREE, THYROIDAB in the last 72 hours. Anemia Panel: No results for input(s): VITAMINB12, FOLATE, FERRITIN, TIBC, IRON, RETICCTPCT in the last 72 hours. Sepsis Labs: Recent Labs  Lab 08/20/21 1249  LATICACIDVEN 0.9     Recent Results (from the past 240 hour(s))  Resp Panel by RT-PCR (Flu A&B, Covid) Nasopharyngeal Swab     Status: None    Collection Time: 08/20/21  2:41 PM   Specimen: Nasopharyngeal Swab; Nasopharyngeal(NP) swabs in vial transport medium  Result Value Ref Range Status   SARS Coronavirus 2 by RT PCR NEGATIVE NEGATIVE Final    Comment: (NOTE) SARS-CoV-2 target nucleic acids are NOT DETECTED.  The SARS-CoV-2 RNA is generally detectable in upper respiratory specimens during the acute phase of infection. The lowest concentration of SARS-CoV-2 viral copies this assay can detect is 138 copies/mL. A negative result does not preclude SARS-Cov-2 infection and should not be used as the sole basis for treatment or other patient management decisions. A negative result may occur with  improper specimen collection/handling, submission of specimen other than nasopharyngeal swab, presence of viral mutation(s) within the areas targeted by this assay, and inadequate number of viral copies(<138 copies/mL). A negative result must be combined with clinical observations, patient history, and epidemiological information. The  expected result is Negative.  Fact Sheet for Patients:  EntrepreneurPulse.com.au  Fact Sheet for Healthcare Providers:  IncredibleEmployment.be  This test is no t yet approved or cleared by the [place] FDA and  has been authorized for detection and/or diagnosis of SARS-CoV-2 by FDA under an Emergency Use Authorization (EUA). This EUA will remain  in effect (meaning this test can be used) for the duration of the COVID-19 declaration under Section 564(b)(1) of the Act, 21 U.S.C.section 360bbb-3(b)(1), unless the authorization is terminated  or revoked sooner.       Influenza A by PCR NEGATIVE NEGATIVE Final   Influenza B by PCR NEGATIVE NEGATIVE Final    Comment: (NOTE) The Xpert Xpress SARS-CoV-2/FLU/RSV plus assay is intended as an aid in the diagnosis of influenza from Nasopharyngeal swab specimens and should not be used as a sole basis for treatment.  Nasal washings and aspirates are unacceptable for Xpert Xpress SARS-CoV-2/FLU/RSV testing.  Fact Sheet for Patients: EntrepreneurPulse.com.au  Fact Sheet for Healthcare Providers: IncredibleEmployment.be  This test is not yet approved or cleared by the [place] FDA and has been authorized for detection and/or diagnosis of SARS-CoV-2 by FDA under an Emergency Use Authorization (EUA). This EUA will remain in effect (meaning this test can be used) for the duration of the COVID-19 declaration under Section 564(b)(1) of the Act, 21 U.S.C. section 360bbb-3(b)(1), unless the authorization is terminated or revoked.  Performed at [facility], [place] [address], [place], [place] [address]           Radiology Studies: No results found.      Scheduled Meds:  apixaban  5 mg Oral BID   vitamin C  500 mg Oral Daily   carvedilol  12.5 mg Oral BID WC   cholecalciferol  1,000 Units Oral QPM   magnesium oxide  400 mg Oral Daily   multivitamin with minerals  1 tablet Oral Daily   pantoprazole  40 mg Oral BID AC   sucralfate  1 g Oral BID WC   Continuous Infusions:  sodium chloride Stopped (08/20/21 1749)     LOS: 5 days    Time spent: 58 min   Georgette Shell, MD  08/25/2021, 1:44 PM

## 2021-08-26 ENCOUNTER — Encounter (HOSPITAL_COMMUNITY): Payer: Self-pay | Admitting: Gastroenterology

## 2021-08-26 DIAGNOSIS — K3189 Other diseases of stomach and duodenum: Secondary | ICD-10-CM | POA: Diagnosis not present

## 2021-08-26 DIAGNOSIS — D500 Iron deficiency anemia secondary to blood loss (chronic): Secondary | ICD-10-CM | POA: Diagnosis not present

## 2021-08-26 DIAGNOSIS — I110 Hypertensive heart disease with heart failure: Secondary | ICD-10-CM | POA: Diagnosis not present

## 2021-08-26 DIAGNOSIS — R627 Adult failure to thrive: Secondary | ICD-10-CM | POA: Diagnosis not present

## 2021-08-26 DIAGNOSIS — M6281 Muscle weakness (generalized): Secondary | ICD-10-CM | POA: Diagnosis not present

## 2021-08-26 DIAGNOSIS — J441 Chronic obstructive pulmonary disease with (acute) exacerbation: Secondary | ICD-10-CM | POA: Diagnosis present

## 2021-08-26 DIAGNOSIS — Z7401 Bed confinement status: Secondary | ICD-10-CM | POA: Diagnosis not present

## 2021-08-26 DIAGNOSIS — G43909 Migraine, unspecified, not intractable, without status migrainosus: Secondary | ICD-10-CM | POA: Diagnosis not present

## 2021-08-26 DIAGNOSIS — F5101 Primary insomnia: Secondary | ICD-10-CM | POA: Diagnosis not present

## 2021-08-26 DIAGNOSIS — R0689 Other abnormalities of breathing: Secondary | ICD-10-CM | POA: Diagnosis not present

## 2021-08-26 DIAGNOSIS — I4891 Unspecified atrial fibrillation: Secondary | ICD-10-CM | POA: Diagnosis not present

## 2021-08-26 DIAGNOSIS — R5381 Other malaise: Secondary | ICD-10-CM | POA: Diagnosis not present

## 2021-08-26 DIAGNOSIS — I739 Peripheral vascular disease, unspecified: Secondary | ICD-10-CM | POA: Diagnosis not present

## 2021-08-26 DIAGNOSIS — Z87891 Personal history of nicotine dependence: Secondary | ICD-10-CM | POA: Diagnosis not present

## 2021-08-26 DIAGNOSIS — Z7901 Long term (current) use of anticoagulants: Secondary | ICD-10-CM | POA: Diagnosis not present

## 2021-08-26 DIAGNOSIS — R531 Weakness: Secondary | ICD-10-CM | POA: Diagnosis not present

## 2021-08-26 DIAGNOSIS — E119 Type 2 diabetes mellitus without complications: Secondary | ICD-10-CM | POA: Diagnosis not present

## 2021-08-26 DIAGNOSIS — I5023 Acute on chronic systolic (congestive) heart failure: Secondary | ICD-10-CM | POA: Diagnosis not present

## 2021-08-26 DIAGNOSIS — Z66 Do not resuscitate: Secondary | ICD-10-CM | POA: Diagnosis present

## 2021-08-26 DIAGNOSIS — R5383 Other fatigue: Secondary | ICD-10-CM | POA: Diagnosis not present

## 2021-08-26 DIAGNOSIS — E1122 Type 2 diabetes mellitus with diabetic chronic kidney disease: Secondary | ICD-10-CM | POA: Diagnosis not present

## 2021-08-26 DIAGNOSIS — R410 Disorientation, unspecified: Secondary | ICD-10-CM | POA: Diagnosis not present

## 2021-08-26 DIAGNOSIS — I499 Cardiac arrhythmia, unspecified: Secondary | ICD-10-CM | POA: Diagnosis not present

## 2021-08-26 DIAGNOSIS — I428 Other cardiomyopathies: Secondary | ICD-10-CM | POA: Diagnosis present

## 2021-08-26 DIAGNOSIS — E782 Mixed hyperlipidemia: Secondary | ICD-10-CM | POA: Diagnosis not present

## 2021-08-26 DIAGNOSIS — Z743 Need for continuous supervision: Secondary | ICD-10-CM | POA: Diagnosis not present

## 2021-08-26 DIAGNOSIS — J90 Pleural effusion, not elsewhere classified: Secondary | ICD-10-CM | POA: Diagnosis not present

## 2021-08-26 DIAGNOSIS — K219 Gastro-esophageal reflux disease without esophagitis: Secondary | ICD-10-CM | POA: Diagnosis not present

## 2021-08-26 DIAGNOSIS — L659 Nonscarring hair loss, unspecified: Secondary | ICD-10-CM | POA: Diagnosis not present

## 2021-08-26 DIAGNOSIS — R000 Tachycardia, unspecified: Secondary | ICD-10-CM | POA: Diagnosis not present

## 2021-08-26 DIAGNOSIS — I498 Other specified cardiac arrhythmias: Secondary | ICD-10-CM | POA: Diagnosis not present

## 2021-08-26 DIAGNOSIS — Z8673 Personal history of transient ischemic attack (TIA), and cerebral infarction without residual deficits: Secondary | ICD-10-CM | POA: Diagnosis not present

## 2021-08-26 DIAGNOSIS — D638 Anemia in other chronic diseases classified elsewhere: Secondary | ICD-10-CM | POA: Diagnosis not present

## 2021-08-26 DIAGNOSIS — G9340 Encephalopathy, unspecified: Secondary | ICD-10-CM | POA: Diagnosis not present

## 2021-08-26 DIAGNOSIS — F32A Depression, unspecified: Secondary | ICD-10-CM | POA: Diagnosis present

## 2021-08-26 DIAGNOSIS — K2960 Other gastritis without bleeding: Secondary | ICD-10-CM | POA: Diagnosis not present

## 2021-08-26 DIAGNOSIS — G928 Other toxic encephalopathy: Secondary | ICD-10-CM | POA: Diagnosis present

## 2021-08-26 DIAGNOSIS — I2510 Atherosclerotic heart disease of native coronary artery without angina pectoris: Secondary | ICD-10-CM | POA: Diagnosis not present

## 2021-08-26 DIAGNOSIS — R443 Hallucinations, unspecified: Secondary | ICD-10-CM | POA: Diagnosis not present

## 2021-08-26 DIAGNOSIS — J811 Chronic pulmonary edema: Secondary | ICD-10-CM | POA: Diagnosis not present

## 2021-08-26 DIAGNOSIS — D649 Anemia, unspecified: Secondary | ICD-10-CM | POA: Diagnosis not present

## 2021-08-26 DIAGNOSIS — E873 Alkalosis: Secondary | ICD-10-CM | POA: Diagnosis present

## 2021-08-26 DIAGNOSIS — I272 Pulmonary hypertension, unspecified: Secondary | ICD-10-CM | POA: Diagnosis not present

## 2021-08-26 DIAGNOSIS — Z83438 Family history of other disorder of lipoprotein metabolism and other lipidemia: Secondary | ICD-10-CM | POA: Diagnosis not present

## 2021-08-26 DIAGNOSIS — F339 Major depressive disorder, recurrent, unspecified: Secondary | ICD-10-CM | POA: Diagnosis not present

## 2021-08-26 DIAGNOSIS — E222 Syndrome of inappropriate secretion of antidiuretic hormone: Secondary | ICD-10-CM | POA: Diagnosis present

## 2021-08-26 DIAGNOSIS — I5043 Acute on chronic combined systolic (congestive) and diastolic (congestive) heart failure: Secondary | ICD-10-CM | POA: Diagnosis present

## 2021-08-26 DIAGNOSIS — Z833 Family history of diabetes mellitus: Secondary | ICD-10-CM | POA: Diagnosis not present

## 2021-08-26 DIAGNOSIS — I10 Essential (primary) hypertension: Secondary | ICD-10-CM | POA: Diagnosis not present

## 2021-08-26 DIAGNOSIS — F411 Generalized anxiety disorder: Secondary | ICD-10-CM | POA: Diagnosis not present

## 2021-08-26 DIAGNOSIS — Z1159 Encounter for screening for other viral diseases: Secondary | ICD-10-CM | POA: Diagnosis not present

## 2021-08-26 DIAGNOSIS — E1151 Type 2 diabetes mellitus with diabetic peripheral angiopathy without gangrene: Secondary | ICD-10-CM | POA: Diagnosis present

## 2021-08-26 DIAGNOSIS — J189 Pneumonia, unspecified organism: Secondary | ICD-10-CM | POA: Diagnosis not present

## 2021-08-26 DIAGNOSIS — R0602 Shortness of breath: Secondary | ICD-10-CM | POA: Diagnosis not present

## 2021-08-26 DIAGNOSIS — E871 Hypo-osmolality and hyponatremia: Secondary | ICD-10-CM | POA: Diagnosis not present

## 2021-08-26 DIAGNOSIS — E861 Hypovolemia: Secondary | ICD-10-CM | POA: Diagnosis present

## 2021-08-26 DIAGNOSIS — J701 Chronic and other pulmonary manifestations due to radiation: Secondary | ICD-10-CM | POA: Diagnosis not present

## 2021-08-26 DIAGNOSIS — I480 Paroxysmal atrial fibrillation: Secondary | ICD-10-CM | POA: Diagnosis not present

## 2021-08-26 DIAGNOSIS — E860 Dehydration: Secondary | ICD-10-CM | POA: Diagnosis not present

## 2021-08-26 DIAGNOSIS — R4182 Altered mental status, unspecified: Secondary | ICD-10-CM | POA: Diagnosis not present

## 2021-08-26 DIAGNOSIS — I509 Heart failure, unspecified: Secondary | ICD-10-CM | POA: Diagnosis not present

## 2021-08-26 DIAGNOSIS — Z8249 Family history of ischemic heart disease and other diseases of the circulatory system: Secondary | ICD-10-CM | POA: Diagnosis not present

## 2021-08-26 DIAGNOSIS — J9611 Chronic respiratory failure with hypoxia: Secondary | ICD-10-CM | POA: Diagnosis not present

## 2021-08-26 DIAGNOSIS — E7800 Pure hypercholesterolemia, unspecified: Secondary | ICD-10-CM | POA: Diagnosis present

## 2021-08-26 DIAGNOSIS — R7889 Finding of other specified substances, not normally found in blood: Secondary | ICD-10-CM | POA: Diagnosis not present

## 2021-08-26 DIAGNOSIS — Z853 Personal history of malignant neoplasm of breast: Secondary | ICD-10-CM | POA: Diagnosis not present

## 2021-08-26 DIAGNOSIS — Z20822 Contact with and (suspected) exposure to covid-19: Secondary | ICD-10-CM | POA: Diagnosis present

## 2021-08-26 DIAGNOSIS — R102 Pelvic and perineal pain: Secondary | ICD-10-CM | POA: Diagnosis not present

## 2021-08-26 DIAGNOSIS — E876 Hypokalemia: Secondary | ICD-10-CM | POA: Diagnosis present

## 2021-08-26 DIAGNOSIS — J9601 Acute respiratory failure with hypoxia: Secondary | ICD-10-CM | POA: Diagnosis not present

## 2021-08-26 DIAGNOSIS — E46 Unspecified protein-calorie malnutrition: Secondary | ICD-10-CM | POA: Diagnosis not present

## 2021-08-26 DIAGNOSIS — I5042 Chronic combined systolic (congestive) and diastolic (congestive) heart failure: Secondary | ICD-10-CM | POA: Diagnosis not present

## 2021-08-26 DIAGNOSIS — J9602 Acute respiratory failure with hypercapnia: Secondary | ICD-10-CM | POA: Diagnosis not present

## 2021-08-26 LAB — COMPREHENSIVE METABOLIC PANEL
ALT: 31 U/L (ref 0–44)
AST: 19 U/L (ref 15–41)
Albumin: 2.5 g/dL — ABNORMAL LOW (ref 3.5–5.0)
Alkaline Phosphatase: 57 U/L (ref 38–126)
Anion gap: 5 (ref 5–15)
BUN: 21 mg/dL (ref 8–23)
CO2: 25 mmol/L (ref 22–32)
Calcium: 8.4 mg/dL — ABNORMAL LOW (ref 8.9–10.3)
Chloride: 102 mmol/L (ref 98–111)
Creatinine, Ser: 0.98 mg/dL (ref 0.44–1.00)
GFR, Estimated: 55 mL/min — ABNORMAL LOW (ref >60)
Glucose, Bld: 128 mg/dL — ABNORMAL HIGH (ref 70–99)
Potassium: 4.6 mmol/L (ref 3.5–5.1)
Sodium: 132 mmol/L — ABNORMAL LOW (ref 135–145)
Total Bilirubin: 0.7 mg/dL (ref 0.3–1.2)
Total Protein: 5.5 g/dL — ABNORMAL LOW (ref 6.5–8.1)

## 2021-08-26 LAB — CBC
HCT: 23 % — ABNORMAL LOW (ref 36.0–46.0)
Hemoglobin: 7.2 g/dL — ABNORMAL LOW (ref 12.0–15.0)
MCH: 28.7 pg (ref 26.0–34.0)
MCHC: 31.3 g/dL (ref 30.0–36.0)
MCV: 91.6 fL (ref 80.0–100.0)
Platelets: 147 10*3/uL — ABNORMAL LOW (ref 150–400)
RBC: 2.51 MIL/uL — ABNORMAL LOW (ref 3.87–5.11)
RDW: 17.1 % — ABNORMAL HIGH (ref 11.5–15.5)
WBC: 8.2 10*3/uL (ref 4.0–10.5)
nRBC: 0.2 % (ref 0.0–0.2)

## 2021-08-26 LAB — PREPARE RBC (CROSSMATCH): Order Confirmation: 1

## 2021-08-26 MED ORDER — CARVEDILOL 12.5 MG PO TABS: 12.5000 mg | ORAL_TABLET | Freq: Two times a day (BID) | ORAL | 0 refills | Status: DC

## 2021-08-26 MED ORDER — SUCRALFATE 1 G PO TABS: 1.0000 g | ORAL_TABLET | Freq: Two times a day (BID) | ORAL | Status: AC

## 2021-08-26 MED ORDER — ONDANSETRON HCL 4 MG PO TABS: 4.0000 mg | ORAL_TABLET | Freq: Four times a day (QID) | ORAL | 0 refills | Status: DC | PRN

## 2021-08-26 MED ORDER — SODIUM CHLORIDE 0.9% IV SOLUTION: Freq: Once | INTRAVENOUS | Status: AC

## 2021-08-26 NOTE — Progress Notes (Signed)
Occupational Therapy Treatment Patient Details Name: Helen [name] MRN: [id number] DOB: 1930/02/05 Today's Date: 08/26/2021   History of present illness 88 y.o. female with past medical history of atrial fibrillation on Eliquis, chronic combined systolic and diastolic heart failure, hypertension, diabetes, peripheral vascular disease, breast cancer and stroke presented to the ER with a hemoglobin of 5.1. Dx of anemia. GI workup in progress.   OT comments  Unfortunately, pt was unable to demonstrate progress this visit and actually showed a regression in her basic ADL and functional mobility as compared to last OT session as evidenced by decreased score on 6-Clicks AM-PAC measure of occupational Performance with previous score of 19/24, and current score of 15/24.  Pt overall remulous and knees buckling and very weak with need of encouragement for minimal activity this AM. This may be due to pt fatigue after having apparently aspirated on her breakfast with a persistent cough lasting several minutes as OT entered room. RN was notified of this.  Pt continues to demonstrate good rehab potential and would benefit from continued skilled OT to increase safety and independence with ADLs and functional transfers to allow pt to return home safely and reduce caregiver burden and fall risk.     Recommendations for follow up therapy are one component of a multi-disciplinary discharge planning process, led by the attending physician.  Recommendations may be updated based on patient status, additional functional criteria and insurance authorization.    Follow Up Recommendations  Skilled nursing-short term rehab (<3 hours/day)    Assistance Recommended at Discharge Frequent or constant Supervision/Assistance  Patient can return home with the following  A lot of help with bathing/dressing/bathroom;A little help with walking and/or transfers;Assistance with cooking/housework;Help with stairs or ramp for  entrance;Assist for transportation;Direct supervision/assist for financial management   Equipment Recommendations  BSC/3in1    Recommendations for Other Services      Precautions / Restrictions Precautions Precautions: Fall Precaution Comments: found herself on the floor just PTA, can't recall how she got there Restrictions Weight Bearing Restrictions: No       Mobility Bed Mobility               General bed mobility comments: Pt in recliner    Transfers Overall transfer level: Needs assistance (Please see ADL section under "Grooming" for OT functional mobility note.)                       Balance Overall balance assessment: Needs assistance Sitting-balance support: Feet supported;No upper extremity supported Sitting balance-Leahy Scale: Fair Sitting balance - Comments: Pt reported feeling unsteady in recliner with LEs down. Pt reported, "I may fall." but no external support needed.   Standing balance support: Bilateral upper extremity supported;Reliant on assistive device for balance;During functional activity Standing balance-Leahy Scale: Poor                             ADL either performed or assessed with clinical judgement   ADL Overall ADL's : Needs assistance/impaired Eating/Feeding: Independent;Bed level   Grooming: Sitting;Wash/dry face;Wash/dry hands;Brushing hair;Maximal assistance Grooming Details (indicate cue type and reason): Pt stood x 2 reps from recliner. First stand to RW with Min As and pt very tremulous and knees buckling. Pt sat back to recliner without warning and with Min As for safety. 1st stand <10 seconds.  Repositioned pt's recliner in front of sink for 2nd attempt. Pt stood again, pulling up on sink  with one hand, puhing from recliner with another after cues, and rested forearms on sink but continued to tremble and was unable to initiate grooming in standing position. Pt once again required Min As lower to chair ater  standing ~20 seconds with Bil forearm support on sink.  Pt required Max As to comb hair in recliner due to fatigue.                   Toilet Transfer Details (indicate cue type and reason): Pt unable to pivot or ake steps this visit. See "Grooming" above.         Functional mobility during ADLs: Minimal assistance;Rolling walker (2 wheels)      Extremity/Trunk Assessment Upper Extremity Assessment Upper Extremity Assessment: Generalized weakness;RUE deficits/detail RUE Deficits / Details: long-standing shoulder limitations RUE Coordination: decreased gross motor            Vision Baseline Vision/History: 1 Wears glasses Ability to See in Adequate Light: 0 Adequate Patient Visual Report: No change from baseline     Perception     Praxis      Cognition Arousal/Alertness: Awake/alert Behavior During Therapy: WFL for tasks assessed/performed Overall Cognitive Status: No family/caregiver present to determine baseline cognitive functioning Area of Impairment: Memory;Safety/judgement                     Memory: Decreased short-term memory   Safety/Judgement: Decreased awareness of safety     General Comments: Pt described overall feeling unwell and unmotivated today but responded well to encouragement and could joke and laugh a little. Pt repeating, "I know I need to move, I just don't want to." Pt also coughing as OT entered room and endorsed aspirating. RN notified of this.          Exercises     Shoulder Instructions       General Comments      Pertinent Vitals/ Pain       Pain Assessment: No/denies pain  Home Living                                          Prior Functioning/Environment              Frequency  Min 2X/week        Progress Toward Goals  OT Goals(current goals can now be found in the care plan section)  Progress towards OT goals: Not progressing toward goals - comment  Acute Rehab OT Goals OT  Goal Formulation: With patient Time For Goal Achievement: 09/05/21 Potential to Achieve Goals: Good  Plan Discharge plan remains appropriate    Co-evaluation                 AM-PAC OT "6 Clicks" Daily Activity     Outcome Measure   Help from another person eating meals?: None Help from another person taking care of personal grooming?: A Lot Help from another person toileting, which includes using toliet, bedpan, or urinal?: A Lot Help from another person bathing (including washing, rinsing, drying)?: A Lot Help from another person to put on and taking off regular upper body clothing?: A Little Help from another person to put on and taking off regular lower body clothing?: A Lot 6 Click Score: 15    End of Session Equipment Utilized During Treatment: Rolling walker (2 wheels);Gait belt;Oxygen (2L)  OT Visit Diagnosis: Unsteadiness  on feet (R26.81);Other abnormalities of gait and mobility (R26.89);Muscle weakness (generalized) (M62.81);Other symptoms and signs involving cognitive function   Activity Tolerance Patient limited by fatigue   Patient Left in chair;with call bell/phone within reach;with nursing/sitter in room   Nurse Communication Other (comment) (Aspiration on breakfast.)        Time: 2458-0998 OT Time Calculation (min): 32 min  Charges: OT General Charges $OT Visit: 1 Visit OT Treatments $Self Care/Home Management : 8-22 mins $Therapeutic Activity: 8-22 mins  Anderson Malta, [place] Office: [phone] 08/26/2021 Julien Girt 08/26/2021, 10:30 AM

## 2021-08-26 NOTE — TOC Transition Note (Signed)
Transition of Care [facility]) - CM/SW Discharge Note   Patient Details  Name: Helen [name] MRN: [id number] Date of Birth: [birth year]  [facility]) CM/SW Contact:  Lennart Pall, LCSW Phone Number: 08/26/2021, 3:25 PM   Clinical Narrative:    Pt medically cleared for dc today to SNF and bed chosen at [facility].  PTAR called at 3:10pm and pt/ family aware and agreeable. RN to call report to [phone].  No further TOC needs.   Final next level of care: Skilled Nursing Facility Barriers to Discharge: Barriers Resolved   Patient Goals and CMS Choice Patient states their goals for this hospitalization and ongoing recovery are:: short term SNF and then return to ILF      Discharge Placement   Existing PASRR number confirmed : 08/25/21          Patient [facility] bed at: [facility] Patient to be transferred to facility by: [place] Name of family member notified: daughter, [name] Patient and family notified of of transfer: 08/26/21  Discharge Plan and Services In-house Referral: Clinical Social Work   Post Acute Care Choice: [place]          DME Arranged: N/A DME Agency: NA                  Social Determinants of Health ([place]) Interventions     Readmission Risk Interventions Readmission Risk Prevention Plan 08/22/2021 08/21/2021  Transportation Screening Complete Complete  PCP or Specialist Appt within 5-7 Days - Complete  PCP or Specialist Appt within 3-5 Days Complete -  Home Care Screening - Complete  Medication Review (RN CM) - Complete  HRI or Home Care Consult Complete -  Social Work Consult for [place] Planning/Counseling Complete -  Palliative Care Screening Complete -  Medication Review Press photographer) Complete -  Some recent data might be hidden

## 2021-08-26 NOTE — Progress Notes (Signed)
Patient is being transferred with PTR to [facility].VSS, Patient is alert and oriented 4, Patient denies any pain and is on oxygen, Report has already been called to facility.  PTR arrived and is being transported at this moment.    Nurse called Patients daughter, Gwenyth Ober to notify her of the transfer.

## 2021-08-26 NOTE — Discharge Summary (Addendum)
Physician Discharge Summary  Helen [name] YIA:[id number] DOB: 28-Aug-1929 DOA: 08/20/2021  PCP: Vernie Shanks, MD  Admit date: 08/20/2021 Discharge date: 08/26/2021  Admitted From: Home Disposition: Skilled nursing facility Recommendations for Outpatient Follow-up:  Follow up with PCP in 1-2 weeks Please obtain BMP/CBC weekly  Home Health: None Equipment/Devices: None Discharge Condition: Stable CODE STATUS: DNR Diet recommendation: Cardiac Brief/Interim Summary:88 y.o. female with past medical history of atrial fibrillation on Eliquis, chronic combined systolic and diastolic heart failure, hypertension, diabetes, peripheral vascular disease, breast cancer and stroke presented to the ER with a hemoglobin of 5.1. She underwent 2 units of prbc transfusion and repeat hemoglobin improved to 8.0. GI consulted  Discharge Diagnoses:  Principal Problem:   Symptomatic anemia Active Problems:   Essential hypertension   Chronic combined systolic and diastolic heart failure (HCC)   Scarring of lung following radiation- RLL   Nonischemic cardiomyopathy (HCC)   Occult blood in stools    Symptomatic anemia/ anemia of acute blood loss:  - stool for occult is positive.  - s/p 4 units of prbc transfusions  Her hemoglobin on the day of discharge was 7.2.  So I gave her another unit of blood on the day of discharge.  Note that she is on Eliquis and she probably has slow mucosal bleed from her stomach.  Check her CBC at least once or twice per week  EGD 08/22/2021 by Dr. Therisa Doyne shows normal esophagus, multiple gastric polyps, normal examined duodenum, erythematous mucosa in the stomach.   Tolerating regular diet  resumed Eliquis sucralfate 1 g twice daily and to continue PPI.     AKI creatinine on the day of discharge was 0.98.  Entresto and Lasix were restarted at the time of discharge.   Follow-up with cardiologist Dr. Oval Linsey  Hypertension: Pressure -her blood pressure was 117/98 134/43  continue on Coreg, Entresto and Lasix. Note that Delene Loll a was restarted along with the Lasix.    Chronic diastolic heart failure.  Echocardiogram showed Left ventricular ejection fraction, by estimation, is 50 to 55%. The left ventricle has low normal function. The left ventricle has no regional  wall motion abnormalities. Left ventricular diastolic parameters are  consistent with Grade II diastolic  dysfunction (pseudonormalization). On [place] oxygen at baseline.     PAF:  Rate controlled on coreg ,on eliquis for anti coagulation    H/o stroke:  On Eliquis.patient states she does not take Lipitor anymore and does not want to be continued.   Hyperlipidemia: she does not want to continue statin    Hyponatremia: Improving sodium 132 Suspect from volume depletion, dehydration.     Disposition seen by physical therapy recommends SNF.  Patient lives alone and is very weak to go back home.COVID-negative on the day of discharge.  Estimated body mass index is 25.4 kg/m as calculated from the following:   Height as of this encounter: 5' 2.5" (1.588 m).   Weight as of this encounter: 64 kg.  Discharge Instructions  Discharge Instructions     Diet - low sodium heart healthy   Complete by: As directed    Increase activity slowly   Complete by: As directed       Allergies as of 08/26/2021       Reactions   Morphine And Related Nausea And Vomiting   Other Other (See Comments)   Other reaction(s): Other (See Comments) Barley & Carrots: learned through allergy testing. Unknown reaction-- MD said not very allergic but not to eat large  quantities Barley & Carrots: learned through allergy testing. Unknown reaction-- MD said not very allergic but not to eat large quantities   Sulfa Antibiotics Hives   Sulfasalazine Hives   Nickel Rash        Medication List     STOP taking these medications    atorvastatin 80 MG tablet Commonly known as: LIPITOR       TAKE these medications     BIOTIN PO Take 1 tablet by mouth daily.   bismuth subsalicylate 740 MG chewable tablet Commonly known as: PEPTO BISMOL Chew 524 mg by mouth as needed for indigestion or diarrhea or loose stools.   carvedilol 12.5 MG tablet Commonly known as: COREG Take 1 tablet (12.5 mg total) by mouth 2 (two) times daily with a meal. What changed:  medication strength how much to take   cholecalciferol 1000 units tablet Commonly known as: VITAMIN D Take 1,000 Units by mouth every evening.   Eliquis 5 MG Tabs tablet Generic drug: apixaban TAKE 1 TABLET BY MOUTH  TWICE DAILY   Entresto 49-51 MG Generic drug: sacubitril-valsartan TAKE 1 TABLET BY MOUTH  TWICE DAILY   furosemide 20 MG tablet Commonly known as: LASIX TAKE 1 TABLET BY MOUTH  DAILY   magnesium oxide 400 MG tablet Commonly known as: MAG-OX 1 tablet by mouth twice a day for 1 week and then 1 daily What changed:  how much to take how to take this when to take this additional instructions   multivitamin with minerals Tabs tablet Take 1 tablet by mouth daily.   ondansetron 4 MG tablet Commonly known as: ZOFRAN Take 1 tablet (4 mg total) by mouth every 6 (six) hours as needed for nausea.   pantoprazole 40 MG tablet Commonly known as: PROTONIX Take 1 tablet (40 mg total) by mouth 2 (two) times daily before a meal. What changed:  when to take this reasons to take this   senna-docusate 8.6-50 MG tablet Commonly known as: Senokot-S Take 2 tablets by mouth at bedtime as needed for mild constipation or moderate constipation.   sucralfate 1 g tablet Commonly known as: CARAFATE Take 1 tablet (1 g total) by mouth 2 (two) times daily with a meal.   vitamin C 500 MG tablet Commonly known as: ASCORBIC ACID Take 500 mg by mouth daily.        Follow-up Information     Vernie Shanks, MD Follow up.   Specialty: Family Medicine Contact information: [place] [address] [phone]          Skeet Latch, MD .   Specialty: Cardiology Contact information: [address] [place] [address] [phone]                Allergies  Allergen Reactions   Morphine And Related Nausea And Vomiting   Other Other (See Comments)    Other reaction(s): Other (See Comments) Barley & Carrots: learned through allergy testing. Unknown reaction-- MD said not very allergic but not to eat large quantities Barley & Carrots: learned through allergy testing. Unknown reaction-- MD said not very allergic but not to eat large quantities   Sulfa Antibiotics Hives   Sulfasalazine Hives   Nickel Rash    Consultations: gi   Procedures/Studies: CT Head Wo Contrast  Result Date: 08/20/2021 CLINICAL DATA:  Dizziness and confusion EXAM: CT HEAD WITHOUT CONTRAST TECHNIQUE: Contiguous axial images were obtained from the base of the skull through the vertex without intravenous contrast. COMPARISON:  CT head dated November 16, 2016 FINDINGS: Brain: Chronic white matter ischemic change. No evidence of acute infarction, hemorrhage, hydrocephalus, extra-axial collection or mass lesion/mass effect. Vascular: No hyperdense vessel or unexpected calcification. Skull: Normal. Negative for fracture or focal lesion. Sinuses/Orbits: Mucosal thickening of the right maxillary and ethmoid sinuses. Other: None. IMPRESSION: No acute intracranial abnormality. Electronically Signed   By: Yetta Glassman M.D.   On: 08/20/2021 14:47   DG Chest Port 1 View  Result Date: 08/20/2021 CLINICAL DATA:  Weakness, cough, dizziness and confusion. EXAM: PORTABLE CHEST 1 VIEW COMPARISON:  01/03/2021 FINDINGS: Chronic peripheral fibrosis especially at the lung bases and in the right upper lobe, with associated bandlike scarring in the right mid lung which is chronically stable. The patient is rotated to the right on today's radiograph, reducing diagnostic sensitivity and specificity. Chronic indistinctness of the right  hemidiaphragm. The patient has known pleural calcifications on the right side. Atherosclerotic calcification of the aortic arch. Heart size within normal limits. IMPRESSION: 1. Stable chronic opacities along the right hemidiaphragm and in the right mid lung, much of which is likely from scarring. There is chronic reticular interstitial accentuation in the lungs as well. Portions of the right hemidiaphragm are obscured and strictly speaking, right lower lobe pneumonia is not readily excluded. 2.  Aortic Atherosclerosis (ICD10-I70.0). 3. No acute findings or progressive opacities. Electronically Signed   By: Van Clines M.D.   On: 08/20/2021 14:24   (Echo, Carotid, EGD, Colonoscopy, ERCP)    Subjective:  She reports she feels tired hemoglobin 7.2 we will transfuse 1 unit of packed RBC prior to discharge. She denies chest pain or shortness of breath Discharge Exam: Vitals:   08/25/21 2107 08/26/21 0621  BP: (!) 120/54 (!) 117/98  Pulse: 75 85  Resp: 18 18  Temp: 98.1 F (36.7 C) 98.3 F (36.8 C)  SpO2: 98% 94%   Vitals:   08/24/21 2138 08/25/21 1325 08/25/21 2107 08/26/21 0621  BP: (!) 104/56 (!) 134/43 (!) 120/54 (!) 117/98  Pulse: 68 78 75 85  Resp: 16 14 18 18   Temp: 99.3 F (37.4 C) 98.7 F (37.1 C) 98.1 F (36.7 C) 98.3 F (36.8 C)  TempSrc: Oral Oral  Oral  SpO2: 99% 98% 98% 94%  Weight:      Height:        General: Pt is alert, awake, not in acute distress Cardiovascular: RRR, S1/S2 +, no rubs, no gallops Respiratory: CTA bilaterally, no wheezing, no rhonchi Abdominal: Soft, NT, ND, bowel sounds + Extremities: no edema, no cyanosis    The results of significant diagnostics from this hospitalization (including imaging, microbiology, ancillary and laboratory) are listed below for reference.     Microbiology: Recent Results (from the past 240 hour(s))  Resp Panel by RT-PCR (Flu A&B, Covid) Nasopharyngeal Swab     Status: None   Collection Time: 08/20/21  2:41  PM   Specimen: Nasopharyngeal Swab; Nasopharyngeal(NP) swabs in vial transport medium  Result Value Ref Range Status   SARS Coronavirus 2 by RT PCR NEGATIVE NEGATIVE Final    Comment: (NOTE) SARS-CoV-2 target nucleic acids are NOT DETECTED.  The SARS-CoV-2 RNA is generally detectable in upper respiratory specimens during the acute phase of infection. The lowest concentration of SARS-CoV-2 viral copies this assay can detect is 138 copies/mL. A negative result does not preclude SARS-Cov-2 infection and should not be used as the sole basis for treatment or other patient management decisions. A negative result may occur with  improper  specimen collection/handling, submission of specimen other than nasopharyngeal swab, presence of viral mutation(s) within the areas targeted by this assay, and inadequate number of viral copies(<138 copies/mL). A negative result must be combined with clinical observations, patient history, and epidemiological information. The expected result is Negative.  Fact Sheet for Patients:  EntrepreneurPulse.com.au  Fact Sheet for Healthcare Providers:  IncredibleEmployment.be  This test is no t yet approved or cleared by the [place] FDA and  has been authorized for detection and/or diagnosis of SARS-CoV-2 by FDA under an Emergency Use Authorization (EUA). This EUA will remain  in effect (meaning this test can be used) for the duration of the COVID-19 declaration under Section 564(b)(1) of the Act, 21 U.S.C.section 360bbb-3(b)(1), unless the authorization is terminated  or revoked sooner.       Influenza A by PCR NEGATIVE NEGATIVE Final   Influenza B by PCR NEGATIVE NEGATIVE Final    Comment: (NOTE) The Xpert Xpress SARS-CoV-2/FLU/RSV plus assay is intended as an aid in the diagnosis of influenza from Nasopharyngeal swab specimens and should not be used as a sole basis for treatment. Nasal washings and aspirates are  unacceptable for Xpert Xpress SARS-CoV-2/FLU/RSV testing.  Fact Sheet for Patients: EntrepreneurPulse.com.au  Fact Sheet for Healthcare Providers: IncredibleEmployment.be  This test is not yet approved or cleared by the [place] FDA and has been authorized for detection and/or diagnosis of SARS-CoV-2 by FDA under an Emergency Use Authorization (EUA). This EUA will remain in effect (meaning this test can be used) for the duration of the COVID-19 declaration under Section 564(b)(1) of the Act, 21 U.S.C. section 360bbb-3(b)(1), unless the authorization is terminated or revoked.  Performed at [facility], [place] [address], [place], [place] [address]   Resp Panel by RT-PCR (Flu A&B, Covid) Nasopharyngeal Swab     Status: None   Collection Time: 08/25/21  2:51 PM   Specimen: Nasopharyngeal Swab; Nasopharyngeal(NP) swabs in vial transport medium  Result Value Ref Range Status   SARS Coronavirus 2 by RT PCR NEGATIVE NEGATIVE Final    Comment: (NOTE) SARS-CoV-2 target nucleic acids are NOT DETECTED.  The SARS-CoV-2 RNA is generally detectable in upper respiratory specimens during the acute phase of infection. The lowest concentration of SARS-CoV-2 viral copies this assay can detect is 138 copies/mL. A negative result does not preclude SARS-Cov-2 infection and should not be used as the sole basis for treatment or other patient management decisions. A negative result may occur with  improper specimen collection/handling, submission of specimen other than nasopharyngeal swab, presence of viral mutation(s) within the areas targeted by this assay, and inadequate number of viral copies(<138 copies/mL). A negative result must be combined with clinical observations, patient history, and epidemiological information. The expected result is Negative.  Fact Sheet for Patients:  EntrepreneurPulse.com.au  Fact Sheet for  Healthcare Providers:  IncredibleEmployment.be  This test is no t yet approved or cleared by the [place] FDA and  has been authorized for detection and/or diagnosis of SARS-CoV-2 by FDA under an Emergency Use Authorization (EUA). This EUA will remain  in effect (meaning this test can be used) for the duration of the COVID-19 declaration under Section 564(b)(1) of the Act, 21 U.S.C.section 360bbb-3(b)(1), unless the authorization is terminated  or revoked sooner.       Influenza A by PCR NEGATIVE NEGATIVE Final   Influenza B by PCR NEGATIVE NEGATIVE Final    Comment: (NOTE) The Xpert Xpress SARS-CoV-2/FLU/RSV plus assay is intended as an aid in the  diagnosis of influenza from Nasopharyngeal swab specimens and should not be used as a sole basis for treatment. Nasal washings and aspirates are unacceptable for Xpert Xpress SARS-CoV-2/FLU/RSV testing.  Fact Sheet for Patients: EntrepreneurPulse.com.au  Fact Sheet for Healthcare Providers: IncredibleEmployment.be  This test is not yet approved or cleared by the [place] FDA and has been authorized for detection and/or diagnosis of SARS-CoV-2 by FDA under an Emergency Use Authorization (EUA). This EUA will remain in effect (meaning this test can be used) for the duration of the COVID-19 declaration under Section 564(b)(1) of the Act, 21 U.S.C. section 360bbb-3(b)(1), unless the authorization is terminated or revoked.  Performed at [facility], [place] [address], [place], [place] [address]      Labs: BNP (last 3 results) Recent Labs    01/04/21 0533 01/09/21 1428  BNP 1,014.2* 485.4*   Basic Metabolic Panel: Recent Labs  Lab 08/20/21 1249 08/21/21 0415 08/23/21 0505 08/24/21 0459 08/26/21 0347  NA 132* 128* 132* 133* 132*  K 4.2 4.3 4.1 4.0 4.6  CL 98 98 101 103 102  CO2 25 19* 22 23 25   GLUCOSE 128* 103* 119* 128* 128*  BUN 40* 42*  35* 28* 21  CREATININE 0.93 1.36* 1.08* 1.06* 0.98  CALCIUM 8.3* 8.0* 8.2* 8.2* 8.4*   Liver Function Tests: Recent Labs  Lab 08/20/21 1249 08/23/21 0505 08/26/21 0347  AST 21 18 19   ALT 16 22 31   ALKPHOS 39 71 57  BILITOT 0.7 1.1 0.7  PROT 6.4* 5.9* 5.5*  ALBUMIN 3.2* 2.8* 2.5*   No results for input(s): LIPASE, AMYLASE in the last 168 hours. No results for input(s): AMMONIA in the last 168 hours. CBC: Recent Labs  Lab 08/20/21 1249 08/21/21 0415 08/21/21 1758 08/22/21 0444 08/23/21 0505 08/24/21 0459 08/26/21 0347  WBC 9.3 10.0  --  9.8 10.1 9.2 8.2  NEUTROABS 7.3  --   --   --   --   --   --   HGB 5.1* 7.8* 8.1* 8.0* 7.7* 7.8* 7.2*  HCT 16.1* 23.1* 23.9* 23.7* 24.2* 24.5* 23.0*  MCV 90.4 86.8  --  87.8 91.3 91.1 91.6  PLT 171 159  --  150 166 153 147*   Cardiac Enzymes: No results for input(s): CKTOTAL, CKMB, CKMBINDEX, TROPONINI in the last 168 hours. BNP: Invalid input(s): POCBNP CBG: No results for input(s): GLUCAP in the last 168 hours. D-Dimer No results for input(s): DDIMER in the last 72 hours. Hgb A1c No results for input(s): HGBA1C in the last 72 hours. Lipid Profile No results for input(s): CHOL, HDL, LDLCALC, TRIG, CHOLHDL, LDLDIRECT in the last 72 hours. Thyroid function studies No results for input(s): TSH, T4TOTAL, T3FREE, THYROIDAB in the last 72 hours.  Invalid input(s): FREET3 Anemia work up No results for input(s): VITAMINB12, FOLATE, FERRITIN, TIBC, IRON, RETICCTPCT in the last 72 hours. Urinalysis    Component Value Date/Time   COLORURINE YELLOW 08/20/2021 1431   APPEARANCEUR CLEAR 08/20/2021 1431   APPEARANCEUR Clear 03/12/2017 1440   LABSPEC 1.014 08/20/2021 1431   PHURINE 5.0 08/20/2021 1431   GLUCOSEU NEGATIVE 08/20/2021 1431   HGBUR NEGATIVE 08/20/2021 1431   BILIRUBINUR NEGATIVE 08/20/2021 1431   BILIRUBINUR Negative 03/12/2017 1440   KETONESUR NEGATIVE 08/20/2021 1431   PROTEINUR NEGATIVE 08/20/2021 1431   NITRITE  NEGATIVE 08/20/2021 1431   LEUKOCYTESUR NEGATIVE 08/20/2021 1431   Sepsis Labs Invalid input(s): PROCALCITONIN,  WBC,  LACTICIDVEN Microbiology Recent Results (from the past 240 hour(s))  Resp Panel  by RT-PCR (Flu A&B, Covid) Nasopharyngeal Swab     Status: None   Collection Time: 08/20/21  2:41 PM   Specimen: Nasopharyngeal Swab; Nasopharyngeal(NP) swabs in vial transport medium  Result Value Ref Range Status   SARS Coronavirus 2 by RT PCR NEGATIVE NEGATIVE Final    Comment: (NOTE) SARS-CoV-2 target nucleic acids are NOT DETECTED.  The SARS-CoV-2 RNA is generally detectable in upper respiratory specimens during the acute phase of infection. The lowest concentration of SARS-CoV-2 viral copies this assay can detect is 138 copies/mL. A negative result does not preclude SARS-Cov-2 infection and should not be used as the sole basis for treatment or other patient management decisions. A negative result may occur with  improper specimen collection/handling, submission of specimen other than nasopharyngeal swab, presence of viral mutation(s) within the areas targeted by this assay, and inadequate number of viral copies(<138 copies/mL). A negative result must be combined with clinical observations, patient history, and epidemiological information. The expected result is Negative.  Fact Sheet for Patients:  EntrepreneurPulse.com.au  Fact Sheet for Healthcare Providers:  IncredibleEmployment.be  This test is no t yet approved or cleared by the [place] FDA and  has been authorized for detection and/or diagnosis of SARS-CoV-2 by FDA under an Emergency Use Authorization (EUA). This EUA will remain  in effect (meaning this test can be used) for the duration of the COVID-19 declaration under Section 564(b)(1) of the Act, 21 U.S.C.section 360bbb-3(b)(1), unless the authorization is terminated  or revoked sooner.       Influenza A by PCR NEGATIVE  NEGATIVE Final   Influenza B by PCR NEGATIVE NEGATIVE Final    Comment: (NOTE) The Xpert Xpress SARS-CoV-2/FLU/RSV plus assay is intended as an aid in the diagnosis of influenza from Nasopharyngeal swab specimens and should not be used as a sole basis for treatment. Nasal washings and aspirates are unacceptable for Xpert Xpress SARS-CoV-2/FLU/RSV testing.  Fact Sheet for Patients: EntrepreneurPulse.com.au  Fact Sheet for Healthcare Providers: IncredibleEmployment.be  This test is not yet approved or cleared by the [place] FDA and has been authorized for detection and/or diagnosis of SARS-CoV-2 by FDA under an Emergency Use Authorization (EUA). This EUA will remain in effect (meaning this test can be used) for the duration of the COVID-19 declaration under Section 564(b)(1) of the Act, 21 U.S.C. section 360bbb-3(b)(1), unless the authorization is terminated or revoked.  Performed at [facility], [place] [address] [place], [place] [address]   Resp Panel by RT-PCR (Flu A&B, Covid) Nasopharyngeal Swab     Status: None   Collection Time: 08/25/21  2:51 PM   Specimen: Nasopharyngeal Swab; Nasopharyngeal(NP) swabs in vial transport medium  Result Value Ref Range Status   SARS Coronavirus 2 by RT PCR NEGATIVE NEGATIVE Final    Comment: (NOTE) SARS-CoV-2 target nucleic acids are NOT DETECTED.  The SARS-CoV-2 RNA is generally detectable in upper respiratory specimens during the acute phase of infection. The lowest concentration of SARS-CoV-2 viral copies this assay can detect is 138 copies/mL. A negative result does not preclude SARS-Cov-2 infection and should not be used as the sole basis for treatment or other patient management decisions. A negative result may occur with  improper specimen collection/handling, submission of specimen other than nasopharyngeal swab, presence of viral mutation(s) within the areas targeted by  this assay, and inadequate number of viral copies(<138 copies/mL). A negative result must be combined with clinical observations, patient history, and epidemiological information. The expected result is Negative.  Fact Sheet  for Patients:  EntrepreneurPulse.com.au  Fact Sheet for Healthcare Providers:  IncredibleEmployment.be  This test is no t yet approved or cleared by the [place] FDA and  has been authorized for detection and/or diagnosis of SARS-CoV-2 by FDA under an Emergency Use Authorization (EUA). This EUA will remain  in effect (meaning this test can be used) for the duration of the COVID-19 declaration under Section 564(b)(1) of the Act, 21 U.S.C.section 360bbb-3(b)(1), unless the authorization is terminated  or revoked sooner.       Influenza A by PCR NEGATIVE NEGATIVE Final   Influenza B by PCR NEGATIVE NEGATIVE Final    Comment: (NOTE) The Xpert Xpress SARS-CoV-2/FLU/RSV plus assay is intended as an aid in the diagnosis of influenza from Nasopharyngeal swab specimens and should not be used as a sole basis for treatment. Nasal washings and aspirates are unacceptable for Xpert Xpress SARS-CoV-2/FLU/RSV testing.  Fact Sheet for Patients: EntrepreneurPulse.com.au  Fact Sheet for Healthcare Providers: IncredibleEmployment.be  This test is not yet approved or cleared by the [place] FDA and has been authorized for detection and/or diagnosis of SARS-CoV-2 by FDA under an Emergency Use Authorization (EUA). This EUA will remain in effect (meaning this test can be used) for the duration of the COVID-19 declaration under Section 564(b)(1) of the Act, 21 U.S.C. section 360bbb-3(b)(1), unless the authorization is terminated or revoked.  Performed at [facility], [place] [address] [place], [place] [address]      Time coordinating discharge: 39  minutes  SIGNED:   Georgette Shell, MD  [facility] 08/26/2021, 9:15 AM

## 2021-08-27 ENCOUNTER — Telehealth: Payer: Self-pay | Admitting: Cardiovascular Disease

## 2021-08-27 LAB — TYPE AND SCREEN
ABO/RH(D): O POS
Antibody Screen: NEGATIVE
Unit division: 0

## 2021-08-27 LAB — BPAM RBC
Blood Product Expiration Date: 202301142359
ISSUE DATE / TIME: 202301031247
Unit Type and Rh: 5100

## 2021-08-27 NOTE — Telephone Encounter (Signed)
Patient with history of A Fib and stroke. GI study did not show any active bleeding. Recommend she continue eliquis

## 2021-08-27 NOTE — Telephone Encounter (Signed)
Daughter updated and verbalized understanding.  

## 2021-08-27 NOTE — Telephone Encounter (Signed)
Patient's daughter [name] called she would like to speak to nurse about her mother's medication eliquis.

## 2021-08-27 NOTE — Telephone Encounter (Signed)
Spoke with pt's daughter. She report pt was recently admitted due to hgb of 5 and was administered 3 units of blood. She state pt's Eliquis was resumed and pt was discharged to [facility]. Daughter is requesting Dr. Blenda Mounts input and whether pt should continue with Eliquis or be switch to another medication.

## 2021-08-28 DIAGNOSIS — I480 Paroxysmal atrial fibrillation: Secondary | ICD-10-CM | POA: Diagnosis not present

## 2021-08-28 DIAGNOSIS — I5042 Chronic combined systolic (congestive) and diastolic (congestive) heart failure: Secondary | ICD-10-CM | POA: Diagnosis not present

## 2021-08-28 DIAGNOSIS — D500 Iron deficiency anemia secondary to blood loss (chronic): Secondary | ICD-10-CM | POA: Diagnosis not present

## 2021-08-28 DIAGNOSIS — I10 Essential (primary) hypertension: Secondary | ICD-10-CM | POA: Diagnosis not present

## 2021-08-29 NOTE — Telephone Encounter (Signed)
Patient call reviewed with Lars Mage, RN.   Miss Dack was admitted 08/20/21-08/26/21 for symptomatic anemia requiring 2 units of PRBC.  Hemoglobin was 5.1 on admission, improved as high as 8 and was 7.2 on discharge.  Discharge instructions included recommendation for CBC/BMET weekly.  Dyspnea on exertion is likely multifactorial deconditioning from recent admission as well as anemia.  Anticipate this will improve over time as she was just discharged 3 days ago.  She is at the skilled nursing facility who will be able to monitor her hemoglobin and improvement carefully as well as provide physical therapy.  Anemia can cause fluid retention. With no weight gain nor swelling low suspicion for fluid retention Thankfully her most recent echocardiogram showed LVEF 50 to 55%.  She should continue to elevate her legs when sitting.  Daughter had asked about the benefit of BNP and I do not think this would be of clinical benefit at this time.  Will ask RN to call patient family member for reassurance.   Loel Dubonnet, NP

## 2021-08-29 NOTE — Telephone Encounter (Signed)
Pt c/o Shortness Of Breath: STAT if SOB developed within the last 24 hours or pt is noticeably SOB on the phone  1. Are you currently SOB (can you hear that pt is SOB on the phone)? no  2. How long have you been experiencing SOB? since Thursday or Wednesday of last week  3. Are you SOB when sitting or when up moving around? when going from a laying down position to sitting up as well ans sitting to standing  4. Are you currently experiencing any other symptoms? no   Patient's daughter calling to speak with nurse again to give an update on the patient's condition. She states the patient has been "struggling" in rehab and has been having labored breathing. She states it happens when going from a laying down position to sitting up as well ans sitting to standing. She states there is no fluid retention that she can see. She also says the patient has low hemoglobin due to a GI bleed and is not sure

## 2021-08-29 NOTE — Telephone Encounter (Signed)
Daughter report today while visiting pt she noticed she was very sob with exertion but doesn't notice any swelling or significant weight increase. Daughter concerned it could be related to low hgb or indicating whether MD felt we should check BNP.    NP Laurann Montana made aware (see below recommendations).  Daughter made aware of NP's recommendations and verbalized understanding.

## 2021-09-01 DIAGNOSIS — R5383 Other fatigue: Secondary | ICD-10-CM | POA: Diagnosis not present

## 2021-09-01 DIAGNOSIS — M6281 Muscle weakness (generalized): Secondary | ICD-10-CM | POA: Diagnosis not present

## 2021-09-01 DIAGNOSIS — I5042 Chronic combined systolic (congestive) and diastolic (congestive) heart failure: Secondary | ICD-10-CM | POA: Diagnosis not present

## 2021-09-01 DIAGNOSIS — Z7901 Long term (current) use of anticoagulants: Secondary | ICD-10-CM | POA: Diagnosis not present

## 2021-09-01 DIAGNOSIS — I4891 Unspecified atrial fibrillation: Secondary | ICD-10-CM | POA: Diagnosis not present

## 2021-09-01 DIAGNOSIS — D649 Anemia, unspecified: Secondary | ICD-10-CM | POA: Diagnosis not present

## 2021-09-02 DIAGNOSIS — D649 Anemia, unspecified: Secondary | ICD-10-CM | POA: Diagnosis not present

## 2021-09-02 DIAGNOSIS — M6281 Muscle weakness (generalized): Secondary | ICD-10-CM | POA: Diagnosis not present

## 2021-09-02 DIAGNOSIS — E860 Dehydration: Secondary | ICD-10-CM | POA: Diagnosis not present

## 2021-09-02 DIAGNOSIS — R5383 Other fatigue: Secondary | ICD-10-CM | POA: Diagnosis not present

## 2021-09-04 DIAGNOSIS — E860 Dehydration: Secondary | ICD-10-CM | POA: Diagnosis not present

## 2021-09-04 DIAGNOSIS — R0602 Shortness of breath: Secondary | ICD-10-CM | POA: Diagnosis not present

## 2021-09-04 DIAGNOSIS — R5383 Other fatigue: Secondary | ICD-10-CM | POA: Diagnosis not present

## 2021-09-04 DIAGNOSIS — M6281 Muscle weakness (generalized): Secondary | ICD-10-CM | POA: Diagnosis not present

## 2021-09-04 DIAGNOSIS — D649 Anemia, unspecified: Secondary | ICD-10-CM | POA: Diagnosis not present

## 2021-09-04 DIAGNOSIS — R410 Disorientation, unspecified: Secondary | ICD-10-CM | POA: Diagnosis not present

## 2021-09-05 ENCOUNTER — Emergency Department (HOSPITAL_COMMUNITY): Payer: Medicare Other

## 2021-09-05 ENCOUNTER — Other Ambulatory Visit: Payer: Self-pay

## 2021-09-05 ENCOUNTER — Encounter (HOSPITAL_COMMUNITY): Payer: Self-pay | Admitting: Oncology

## 2021-09-05 ENCOUNTER — Inpatient Hospital Stay (HOSPITAL_COMMUNITY)
Admission: EM | Admit: 2021-09-05 | Discharge: 2021-09-15 | DRG: 189 | Disposition: A | Discharge status: Skilled Nursing Facility | Payer: Medicare Other | Source: Skilled Nursing Facility | Attending: Family Medicine | Admitting: Family Medicine

## 2021-09-05 DIAGNOSIS — R2689 Other abnormalities of gait and mobility: Secondary | ICD-10-CM | POA: Diagnosis not present

## 2021-09-05 DIAGNOSIS — R000 Tachycardia, unspecified: Secondary | ICD-10-CM | POA: Diagnosis not present

## 2021-09-05 DIAGNOSIS — J9602 Acute respiratory failure with hypercapnia: Principal | ICD-10-CM | POA: Diagnosis present

## 2021-09-05 DIAGNOSIS — Z853 Personal history of malignant neoplasm of breast: Secondary | ICD-10-CM

## 2021-09-05 DIAGNOSIS — E1151 Type 2 diabetes mellitus with diabetic peripheral angiopathy without gangrene: Secondary | ICD-10-CM | POA: Diagnosis present

## 2021-09-05 DIAGNOSIS — I480 Paroxysmal atrial fibrillation: Secondary | ICD-10-CM | POA: Diagnosis present

## 2021-09-05 DIAGNOSIS — R5381 Other malaise: Secondary | ICD-10-CM | POA: Diagnosis not present

## 2021-09-05 DIAGNOSIS — Z87891 Personal history of nicotine dependence: Secondary | ICD-10-CM | POA: Diagnosis not present

## 2021-09-05 DIAGNOSIS — Z923 Personal history of irradiation: Secondary | ICD-10-CM

## 2021-09-05 DIAGNOSIS — I110 Hypertensive heart disease with heart failure: Secondary | ICD-10-CM | POA: Diagnosis not present

## 2021-09-05 DIAGNOSIS — Z66 Do not resuscitate: Secondary | ICD-10-CM | POA: Diagnosis present

## 2021-09-05 DIAGNOSIS — I509 Heart failure, unspecified: Secondary | ICD-10-CM | POA: Diagnosis not present

## 2021-09-05 DIAGNOSIS — J90 Pleural effusion, not elsewhere classified: Secondary | ICD-10-CM | POA: Diagnosis not present

## 2021-09-05 DIAGNOSIS — E119 Type 2 diabetes mellitus without complications: Secondary | ICD-10-CM

## 2021-09-05 DIAGNOSIS — E861 Hypovolemia: Secondary | ICD-10-CM | POA: Diagnosis present

## 2021-09-05 DIAGNOSIS — N200 Calculus of kidney: Secondary | ICD-10-CM

## 2021-09-05 DIAGNOSIS — J811 Chronic pulmonary edema: Secondary | ICD-10-CM | POA: Diagnosis not present

## 2021-09-05 DIAGNOSIS — J9601 Acute respiratory failure with hypoxia: Secondary | ICD-10-CM | POA: Diagnosis present

## 2021-09-05 DIAGNOSIS — R339 Retention of urine, unspecified: Secondary | ICD-10-CM | POA: Diagnosis present

## 2021-09-05 DIAGNOSIS — I428 Other cardiomyopathies: Secondary | ICD-10-CM | POA: Diagnosis present

## 2021-09-05 DIAGNOSIS — R0609 Other forms of dyspnea: Secondary | ICD-10-CM | POA: Diagnosis not present

## 2021-09-05 DIAGNOSIS — Z91018 Allergy to other foods: Secondary | ICD-10-CM

## 2021-09-05 DIAGNOSIS — F32A Depression, unspecified: Secondary | ICD-10-CM | POA: Diagnosis present

## 2021-09-05 DIAGNOSIS — J441 Chronic obstructive pulmonary disease with (acute) exacerbation: Secondary | ICD-10-CM | POA: Diagnosis present

## 2021-09-05 DIAGNOSIS — G928 Other toxic encephalopathy: Secondary | ICD-10-CM | POA: Diagnosis present

## 2021-09-05 DIAGNOSIS — R102 Pelvic and perineal pain: Secondary | ICD-10-CM | POA: Diagnosis not present

## 2021-09-05 DIAGNOSIS — Z9109 Other allergy status, other than to drugs and biological substances: Secondary | ICD-10-CM

## 2021-09-05 DIAGNOSIS — G9341 Metabolic encephalopathy: Secondary | ICD-10-CM | POA: Diagnosis not present

## 2021-09-05 DIAGNOSIS — G9340 Encephalopathy, unspecified: Secondary | ICD-10-CM | POA: Diagnosis present

## 2021-09-05 DIAGNOSIS — E876 Hypokalemia: Secondary | ICD-10-CM | POA: Diagnosis present

## 2021-09-05 DIAGNOSIS — R410 Disorientation, unspecified: Secondary | ICD-10-CM | POA: Diagnosis not present

## 2021-09-05 DIAGNOSIS — E7800 Pure hypercholesterolemia, unspecified: Secondary | ICD-10-CM | POA: Diagnosis present

## 2021-09-05 DIAGNOSIS — R1312 Dysphagia, oropharyngeal phase: Secondary | ICD-10-CM | POA: Diagnosis not present

## 2021-09-05 DIAGNOSIS — Z20822 Contact with and (suspected) exposure to covid-19: Secondary | ICD-10-CM | POA: Diagnosis present

## 2021-09-05 DIAGNOSIS — R0602 Shortness of breath: Secondary | ICD-10-CM | POA: Diagnosis not present

## 2021-09-05 DIAGNOSIS — Z7901 Long term (current) use of anticoagulants: Secondary | ICD-10-CM

## 2021-09-05 DIAGNOSIS — I272 Pulmonary hypertension, unspecified: Secondary | ICD-10-CM | POA: Diagnosis not present

## 2021-09-05 DIAGNOSIS — R262 Difficulty in walking, not elsewhere classified: Secondary | ICD-10-CM | POA: Diagnosis not present

## 2021-09-05 DIAGNOSIS — F419 Anxiety disorder, unspecified: Secondary | ICD-10-CM | POA: Diagnosis present

## 2021-09-05 DIAGNOSIS — R443 Hallucinations, unspecified: Secondary | ICD-10-CM | POA: Diagnosis not present

## 2021-09-05 DIAGNOSIS — Z7401 Bed confinement status: Secondary | ICD-10-CM | POA: Diagnosis not present

## 2021-09-05 DIAGNOSIS — D638 Anemia in other chronic diseases classified elsewhere: Secondary | ICD-10-CM | POA: Diagnosis present

## 2021-09-05 DIAGNOSIS — Z833 Family history of diabetes mellitus: Secondary | ICD-10-CM

## 2021-09-05 DIAGNOSIS — R4182 Altered mental status, unspecified: Secondary | ICD-10-CM | POA: Diagnosis not present

## 2021-09-05 DIAGNOSIS — I499 Cardiac arrhythmia, unspecified: Secondary | ICD-10-CM | POA: Diagnosis not present

## 2021-09-05 DIAGNOSIS — N2889 Other specified disorders of kidney and ureter: Secondary | ICD-10-CM | POA: Diagnosis not present

## 2021-09-05 DIAGNOSIS — I10 Essential (primary) hypertension: Secondary | ICD-10-CM | POA: Diagnosis not present

## 2021-09-05 DIAGNOSIS — I739 Peripheral vascular disease, unspecified: Secondary | ICD-10-CM | POA: Diagnosis not present

## 2021-09-05 DIAGNOSIS — R188 Other ascites: Secondary | ICD-10-CM | POA: Diagnosis not present

## 2021-09-05 DIAGNOSIS — Z8719 Personal history of other diseases of the digestive system: Secondary | ICD-10-CM

## 2021-09-05 DIAGNOSIS — R5383 Other fatigue: Secondary | ICD-10-CM | POA: Diagnosis not present

## 2021-09-05 DIAGNOSIS — Z90710 Acquired absence of both cervix and uterus: Secondary | ICD-10-CM

## 2021-09-05 DIAGNOSIS — E860 Dehydration: Secondary | ICD-10-CM | POA: Diagnosis not present

## 2021-09-05 DIAGNOSIS — R0689 Other abnormalities of breathing: Secondary | ICD-10-CM | POA: Diagnosis not present

## 2021-09-05 DIAGNOSIS — I4891 Unspecified atrial fibrillation: Secondary | ICD-10-CM | POA: Diagnosis not present

## 2021-09-05 DIAGNOSIS — I5043 Acute on chronic combined systolic (congestive) and diastolic (congestive) heart failure: Secondary | ICD-10-CM | POA: Diagnosis present

## 2021-09-05 DIAGNOSIS — E871 Hypo-osmolality and hyponatremia: Secondary | ICD-10-CM | POA: Diagnosis not present

## 2021-09-05 DIAGNOSIS — Z83438 Family history of other disorder of lipoprotein metabolism and other lipidemia: Secondary | ICD-10-CM

## 2021-09-05 DIAGNOSIS — Z882 Allergy status to sulfonamides status: Secondary | ICD-10-CM

## 2021-09-05 DIAGNOSIS — I5023 Acute on chronic systolic (congestive) heart failure: Secondary | ICD-10-CM | POA: Diagnosis not present

## 2021-09-05 DIAGNOSIS — E873 Alkalosis: Secondary | ICD-10-CM | POA: Diagnosis present

## 2021-09-05 DIAGNOSIS — E222 Syndrome of inappropriate secretion of antidiuretic hormone: Secondary | ICD-10-CM | POA: Diagnosis present

## 2021-09-05 DIAGNOSIS — J9611 Chronic respiratory failure with hypoxia: Secondary | ICD-10-CM | POA: Diagnosis not present

## 2021-09-05 DIAGNOSIS — Z8249 Family history of ischemic heart disease and other diseases of the circulatory system: Secondary | ICD-10-CM

## 2021-09-05 DIAGNOSIS — Z79899 Other long term (current) drug therapy: Secondary | ICD-10-CM

## 2021-09-05 DIAGNOSIS — Z8673 Personal history of transient ischemic attack (TIA), and cerebral infarction without residual deficits: Secondary | ICD-10-CM

## 2021-09-05 DIAGNOSIS — R627 Adult failure to thrive: Secondary | ICD-10-CM | POA: Diagnosis not present

## 2021-09-05 DIAGNOSIS — R498 Other voice and resonance disorders: Secondary | ICD-10-CM | POA: Diagnosis not present

## 2021-09-05 DIAGNOSIS — I2510 Atherosclerotic heart disease of native coronary artery without angina pectoris: Secondary | ICD-10-CM | POA: Diagnosis not present

## 2021-09-05 DIAGNOSIS — R41841 Cognitive communication deficit: Secondary | ICD-10-CM | POA: Diagnosis not present

## 2021-09-05 DIAGNOSIS — Z885 Allergy status to narcotic agent status: Secondary | ICD-10-CM

## 2021-09-05 DIAGNOSIS — M6281 Muscle weakness (generalized): Secondary | ICD-10-CM | POA: Diagnosis not present

## 2021-09-05 DIAGNOSIS — R531 Weakness: Secondary | ICD-10-CM | POA: Diagnosis not present

## 2021-09-05 DIAGNOSIS — D649 Anemia, unspecified: Secondary | ICD-10-CM | POA: Diagnosis not present

## 2021-09-05 DIAGNOSIS — Z743 Need for continuous supervision: Secondary | ICD-10-CM | POA: Diagnosis not present

## 2021-09-05 LAB — CBC WITH DIFFERENTIAL/PLATELET
Abs Immature Granulocytes: 0.14 10*3/uL — ABNORMAL HIGH (ref 0.00–0.07)
Basophils Absolute: 0 10*3/uL (ref 0.0–0.1)
Basophils Relative: 0 %
Eosinophils Absolute: 0.1 10*3/uL (ref 0.0–0.5)
Eosinophils Relative: 1 %
HCT: 29.9 % — ABNORMAL LOW (ref 36.0–46.0)
Hemoglobin: 9.5 g/dL — ABNORMAL LOW (ref 12.0–15.0)
Immature Granulocytes: 2 %
Lymphocytes Relative: 6 %
Lymphs Abs: 0.5 10*3/uL — ABNORMAL LOW (ref 0.7–4.0)
MCH: 27.8 pg (ref 26.0–34.0)
MCHC: 31.8 g/dL (ref 30.0–36.0)
MCV: 87.4 fL (ref 80.0–100.0)
Monocytes Absolute: 0.5 10*3/uL (ref 0.1–1.0)
Monocytes Relative: 6 %
Neutro Abs: 7.3 10*3/uL (ref 1.7–7.7)
Neutrophils Relative %: 85 %
Platelets: 162 10*3/uL (ref 150–400)
RBC: 3.42 MIL/uL — ABNORMAL LOW (ref 3.87–5.11)
RDW: 17.5 % — ABNORMAL HIGH (ref 11.5–15.5)
WBC: 8.6 10*3/uL (ref 4.0–10.5)
nRBC: 0 % (ref 0.0–0.2)

## 2021-09-05 LAB — BLOOD GAS, VENOUS
Acid-Base Excess: 13.6 mmol/L — ABNORMAL HIGH (ref 0.0–2.0)
Acid-Base Excess: 15.1 mmol/L — ABNORMAL HIGH (ref 0.0–2.0)
Bicarbonate: 41.2 mmol/L — ABNORMAL HIGH (ref 20.0–28.0)
Bicarbonate: 41.7 mmol/L — ABNORMAL HIGH (ref 20.0–28.0)
O2 Saturation: 45.4 %
O2 Saturation: 49.2 %
Patient temperature: 98.6
Patient temperature: 98.6
pCO2, Ven: 76.5 mmHg (ref 44.0–60.0)
pCO2, Ven: 63.7 mmHg — ABNORMAL HIGH (ref 44.0–60.0)
pH, Ven: 7.35 (ref 7.250–7.430)
pH, Ven: 7.432 — ABNORMAL HIGH (ref 7.250–7.430)
pO2, Ven: <31 mmHg — CL (ref 32.0–45.0)
pO2, Ven: 28.7 mmHg — CL (ref 32.0–45.0)

## 2021-09-05 LAB — TYPE AND SCREEN
ABO/RH(D): O POS
Antibody Screen: NEGATIVE

## 2021-09-05 LAB — RESP PANEL BY RT-PCR (FLU A&B, COVID) ARPGX2
Influenza A by PCR: NEGATIVE
Influenza B by PCR: NEGATIVE
SARS Coronavirus 2 by RT PCR: NEGATIVE

## 2021-09-05 LAB — URINALYSIS, ROUTINE W REFLEX MICROSCOPIC
Bilirubin Urine: NEGATIVE
Glucose, UA: NEGATIVE mg/dL
Ketones, ur: NEGATIVE mg/dL
Nitrite: NEGATIVE
Protein, ur: NEGATIVE mg/dL
RBC / HPF: >50 RBC/hpf — ABNORMAL HIGH (ref 0–5)
Specific Gravity, Urine: 1.006 (ref 1.005–1.030)
pH: 7 (ref 5.0–8.0)

## 2021-09-05 LAB — TROPONIN I (HIGH SENSITIVITY)
Troponin I (High Sensitivity): 15 ng/L (ref <18)
Troponin I (High Sensitivity): 16 ng/L (ref <18)

## 2021-09-05 LAB — COMPREHENSIVE METABOLIC PANEL
ALT: 39 U/L (ref 0–44)
AST: 27 U/L (ref 15–41)
Albumin: 2.8 g/dL — ABNORMAL LOW (ref 3.5–5.0)
Alkaline Phosphatase: 57 U/L (ref 38–126)
Anion gap: 9 (ref 5–15)
BUN: 10 mg/dL (ref 8–23)
CO2: 38 mmol/L — ABNORMAL HIGH (ref 22–32)
Calcium: 8.3 mg/dL — ABNORMAL LOW (ref 8.9–10.3)
Chloride: 82 mmol/L — ABNORMAL LOW (ref 98–111)
Creatinine, Ser: 0.62 mg/dL (ref 0.44–1.00)
GFR, Estimated: >60 mL/min (ref >60)
Glucose, Bld: 148 mg/dL — ABNORMAL HIGH (ref 70–99)
Potassium: 3.9 mmol/L (ref 3.5–5.1)
Sodium: 129 mmol/L — ABNORMAL LOW (ref 135–145)
Total Bilirubin: 0.8 mg/dL (ref 0.3–1.2)
Total Protein: 6.2 g/dL — ABNORMAL LOW (ref 6.5–8.1)

## 2021-09-05 LAB — BRAIN NATRIURETIC PEPTIDE: B Natriuretic Peptide: 3071.6 pg/mL — ABNORMAL HIGH (ref 0.0–100.0)

## 2021-09-05 LAB — CREATININE, URINE, RANDOM: Creatinine, Urine: 23.05 mg/dL

## 2021-09-05 LAB — SODIUM, URINE, RANDOM: Sodium, Ur: 108 mmol/L

## 2021-09-05 LAB — CBG MONITORING, ED
Glucose-Capillary: 161 mg/dL — ABNORMAL HIGH (ref 70–99)
Glucose-Capillary: 188 mg/dL — ABNORMAL HIGH (ref 70–99)

## 2021-09-05 MED ORDER — SACUBITRIL-VALSARTAN 49-51 MG PO TABS
1.0000 | ORAL_TABLET | Freq: Two times a day (BID) | ORAL | Status: DC
  Administered 2021-09-06 – 2021-09-15 (×18): 1 via ORAL
  Filled 2021-09-05 (×21): qty 1

## 2021-09-05 MED ORDER — APIXABAN 5 MG PO TABS
5.0000 mg | ORAL_TABLET | Freq: Two times a day (BID) | ORAL | Status: DC
  Administered 2021-09-05 – 2021-09-11 (×12): 5 mg via ORAL
  Filled 2021-09-05 (×12): qty 1

## 2021-09-05 MED ORDER — FUROSEMIDE 10 MG/ML IJ SOLN
40.0000 mg | Freq: Once | INTRAMUSCULAR | Status: AC
  Administered 2021-09-05: 40 mg via INTRAVENOUS
  Filled 2021-09-05: qty 4

## 2021-09-05 MED ORDER — CARVEDILOL 12.5 MG PO TABS
12.5000 mg | ORAL_TABLET | Freq: Two times a day (BID) | ORAL | Status: DC
  Administered 2021-09-06 – 2021-09-11 (×11): 12.5 mg via ORAL
  Filled 2021-09-05 (×12): qty 1

## 2021-09-05 MED ORDER — IPRATROPIUM-ALBUTEROL 0.5-2.5 (3) MG/3ML IN SOLN
3.0000 mL | Freq: Once | RESPIRATORY_TRACT | Status: AC
  Administered 2021-09-05: 3 mL via RESPIRATORY_TRACT
  Filled 2021-09-05: qty 3

## 2021-09-05 MED ORDER — INSULIN ASPART 100 UNIT/ML IJ SOLN
0.0000 [IU] | INTRAMUSCULAR | Status: DC
  Administered 2021-09-05 – 2021-09-06 (×2): 2 [IU] via SUBCUTANEOUS
  Administered 2021-09-06 (×3): 1 [IU] via SUBCUTANEOUS
  Filled 2021-09-05: qty 0.09

## 2021-09-05 MED ORDER — PANTOPRAZOLE SODIUM 40 MG PO TBEC
40.0000 mg | DELAYED_RELEASE_TABLET | Freq: Every day | ORAL | Status: DC
  Administered 2021-09-06 – 2021-09-15 (×10): 40 mg via ORAL
  Filled 2021-09-05 (×10): qty 1

## 2021-09-05 MED ORDER — SODIUM CHLORIDE 0.9% FLUSH
3.0000 mL | Freq: Two times a day (BID) | INTRAVENOUS | Status: DC
  Administered 2021-09-05 – 2021-09-15 (×20): 3 mL via INTRAVENOUS

## 2021-09-05 MED ORDER — POLYETHYLENE GLYCOL 3350 17 G PO PACK: 17.0000 g | PACK | Freq: Every day | ORAL | Status: DC | PRN

## 2021-09-05 MED ORDER — IBUPROFEN 200 MG PO TABS
400.0000 mg | ORAL_TABLET | Freq: Four times a day (QID) | ORAL | Status: DC | PRN
  Administered 2021-09-07 – 2021-09-10 (×4): 400 mg via ORAL
  Filled 2021-09-05 (×4): qty 2

## 2021-09-05 MED ORDER — FUROSEMIDE 10 MG/ML IJ SOLN
40.0000 mg | Freq: Two times a day (BID) | INTRAMUSCULAR | Status: DC
  Administered 2021-09-06 – 2021-09-08 (×4): 40 mg via INTRAVENOUS
  Filled 2021-09-05 (×6): qty 4

## 2021-09-05 NOTE — ED Triage Notes (Signed)
Pt bib [facility] from [facility] d/t unknown lab values.  Pt recently hospitalized here for anemia requiring blood transfusion however facility is unable to obtain lab results at this time. Last Hgb value was 9 one week ago.  Family reported to EMS respiratory decline, EMS gave a total of 10 mg albuterol, 1 mg atrovent w/ improvement in lung sounds.  Pt also given 125 mg solumedrol and 4 mg zofran.

## 2021-09-05 NOTE — ED Provider Notes (Signed)
I provided a substantive portion of the care of this patient.  I personally performed the entirety of the exam and medical decision making for this encounter.  EKG Interpretation  Date/Time:  Friday September 05 2021 16:31:19 EST Ventricular Rate:  93 PR Interval:  197 QRS Duration: 100 QT Interval:  476 QTC Calculation: 593 R Axis:   81 Text Interpretation: Sinus rhythm with first degree AV block Borderline right axis deviation Borderline repolarization abnormality Prolonged QT interval Confirmed by Dorie Rank [phone]) on 09/05/2021 4:51:38 PM  Patient presented with respiratory difficulty.  Recently hospital with anemia requiring blood transfusion.  My exam the patient is alert and awake.  She is on nasal cannula oxygen and her oxygen saturation is in the mid 90s.  Venous blood gas however does show evidence of hypercarbia although no acidosis.  Metabolic panel still pending but we will proceed with BiPAP and continue to monitor closely.   Dorie Rank, MD 09/05/21 [phone]

## 2021-09-05 NOTE — ED Notes (Signed)
Spoke to [place] in the lab who reports that blood sent down by Junior, RN will be ran for the pending labs.

## 2021-09-05 NOTE — ED Provider Notes (Signed)
[facility] DEPT Provider Note   CSN: [id number] Arrival date & time: 09/05/21  1521     History  Chief Complaint  Patient presents with   Abnormal Labs    Lezlie Ritchey is a 88 y.o. female.  HPI   88 year old female with a history of CVA, anemia, anxiety, cancer, CHF, depression, diabetes, hyperlipidemia, hypertension, who presents to the ED for eval of abnormal labs per triage note.  Level 5 caveat as pt is not really able to tell me why she is here. She states that her "levels are going down"  She does not that she has been at [facility] for the last week. Since being there she has experienced generalized weakness over the last week. She has had some lightheadedness. Denies chest pain. Pt is currently on eliquis. She denies any dark/tarry stools since being at [facility]. She does note that she has had sob for the last 2 weeks. Denies cough or ble swelling.   4:16 PM Mishia from [facility]. She states that this is the first time she is taking care of the patient and she does not know if the patient wears oxygen chronically. she states that pt has been hallucinating, having increased confusion, and hypoxia. They were also concerned because the patient needs to have stat labs and while she was able to have labs drawn yesterday, they have not resulted due to some errors with the lab. The NP on site was reportedly concerned about her hgb.    Home Medications Prior to Admission medications   Medication Sig Start Date End Date Taking? Authorizing Provider  BIOTIN PO Take 1 tablet by mouth daily.    [provider]  bismuth subsalicylate (PEPTO BISMOL) 262 MG chewable tablet Chew 524 mg by mouth as needed for indigestion or diarrhea or loose stools.    [provider]  carvedilol (COREG) 12.5 MG tablet Take 1 tablet (12.5 mg total) by mouth 2 (two) times daily with a meal. 08/26/21   Georgette Shell,  MD  cholecalciferol (VITAMIN D) 1000 units tablet Take 1,000 Units by mouth every evening.     [provider]  ELIQUIS 5 MG TABS tablet TAKE 1 TABLET BY MOUTH  TWICE DAILY 08/13/21   Skeet Latch, MD  ENTRESTO 49-51 MG TAKE 1 TABLET BY MOUTH  TWICE DAILY 03/28/21   Skeet Latch, MD  furosemide (LASIX) 20 MG tablet TAKE 1 TABLET BY MOUTH  DAILY 03/28/21   Skeet Latch, MD  magnesium oxide (MAG-OX) 400 MG tablet 1 tablet by mouth twice a day for 1 week and then 1 daily Patient taking differently: Take 400 mg by mouth daily. 03/24/18   Skeet Latch, MD  Multiple Vitamin (MULTIVITAMIN WITH MINERALS) TABS tablet Take 1 tablet by mouth daily.     [provider]  ondansetron (ZOFRAN) 4 MG tablet Take 1 tablet (4 mg total) by mouth every 6 (six) hours as needed for nausea. 08/26/21   Georgette Shell, MD  pantoprazole (PROTONIX) 40 MG tablet Take 1 tablet (40 mg total) by mouth 2 (two) times daily before a meal. Patient taking differently: Take 40 mg by mouth daily as needed (acid reflux). 12/14/19   Amin, Jeanella Flattery, MD  senna-docusate (SENOKOT-S) 8.6-50 MG tablet Take 2 tablets by mouth at bedtime as needed for mild constipation or moderate constipation. 12/14/19   Amin, Jeanella Flattery, MD  sucralfate (CARAFATE) 1 g tablet Take 1 tablet (1 g total) by  mouth 2 (two) times daily with a meal. 08/26/21   Georgette Shell, MD  vitamin C (ASCORBIC ACID) 500 MG tablet Take 500 mg by mouth daily.     [provider]      Allergies    Morphine and related, Other, Sulfa antibiotics, Sulfasalazine, and Nickel    Review of Systems   Review of Systems  Constitutional:  Negative for fever.  HENT:  Negative for ear pain and sore throat.   Eyes:  Negative for visual disturbance.  Respiratory:  Positive for shortness of breath. Negative for cough.   Cardiovascular:  Negative for chest pain and leg swelling.  Gastrointestinal:  Negative for abdominal pain, constipation,  diarrhea, nausea and vomiting.  Genitourinary:  Negative for dysuria and hematuria.  Musculoskeletal:  Negative for back pain.  Skin:  Negative for rash.  Neurological:  Negative for headaches.  All other systems reviewed and are negative.  Physical Exam Updated Vital Signs BP (!) 169/84    Pulse 85    Temp 97.7 F (36.5 C) (Oral)    Resp 16    Ht 5' 4.5" (1.638 m)    Wt 64.9 kg    SpO2 98%    BMI 24.17 kg/m  Physical Exam Vitals and nursing note reviewed.  Constitutional:      General: She is not in acute distress.    Appearance: She is well-developed.  HENT:     Head: Normocephalic and atraumatic.  Eyes:     Conjunctiva/sclera: Conjunctivae normal.  Cardiovascular:     Rate and Rhythm: Normal rate and regular rhythm.  Pulmonary:     Effort: Pulmonary effort is normal. No respiratory distress.     Breath sounds: Wheezing (rare end expiratory wheezing) and rales present.  Abdominal:     General: Bowel sounds are normal.     Palpations: Abdomen is soft.     Tenderness: There is no abdominal tenderness.  Musculoskeletal:        General: No swelling.     Cervical back: Neck supple.     Right lower leg: No edema.     Left lower leg: No edema.  Skin:    General: Skin is warm and dry.     Capillary Refill: Capillary refill takes less than 2 seconds.  Neurological:     Mental Status: She is alert.  Psychiatric:        Mood and Affect: Mood normal.    ED Results / Procedures / Treatments   Labs (all labs ordered are listed, but only abnormal results are displayed) Labs Reviewed  COMPREHENSIVE METABOLIC PANEL - Abnormal; Notable for the following components:      Result Value   Sodium 129 (*)    Chloride 82 (*)    CO2 38 (*)    Glucose, Bld 148 (*)    Calcium 8.3 (*)    Total Protein 6.2 (*)    Albumin 2.8 (*)    All other components within normal limits  CBC WITH DIFFERENTIAL/PLATELET - Abnormal; Notable for the following components:   RBC 3.42 (*)    Hemoglobin  9.5 (*)    HCT 29.9 (*)    RDW 17.5 (*)    Lymphs Abs 0.5 (*)    Abs Immature Granulocytes 0.14 (*)    All other components within normal limits  BRAIN NATRIURETIC PEPTIDE - Abnormal; Notable for the following components:   B Natriuretic Peptide 3,071.6 (*)    All other components within normal limits  BLOOD GAS, VENOUS - Abnormal; Notable for the following components:   pCO2, Ven 76.5 (*)    pO2, Ven <31.0 (*)    Bicarbonate 41.2 (*)    Acid-Base Excess 13.6 (*)    All other components within normal limits  RESP PANEL BY RT-PCR (FLU A&B, COVID) ARPGX2  URINALYSIS, ROUTINE W REFLEX MICROSCOPIC  SODIUM, URINE, RANDOM  OSMOLALITY, URINE  CREATININE, URINE, RANDOM  OSMOLALITY  BLOOD GAS, VENOUS  MAGNESIUM  COMPREHENSIVE METABOLIC PANEL  CBC  TYPE AND SCREEN  TROPONIN I (HIGH SENSITIVITY)  TROPONIN I (HIGH SENSITIVITY)    EKG EKG Interpretation  Date/Time:  Friday September 05 2021 16:31:19 EST Ventricular Rate:  93 PR Interval:  197 QRS Duration: 100 QT Interval:  476 QTC Calculation: 593 R Axis:   81 Text Interpretation: Sinus rhythm with first degree AV block Borderline right axis deviation Borderline repolarization abnormality Prolonged QT interval Confirmed by Dorie Rank [phone]) on 09/05/2021 4:51:38 PM  Radiology DG Chest 2 View  Result Date: 09/05/2021 CLINICAL DATA:  Pulmonary edema, history of tobacco abuse EXAM: CHEST - 2 VIEW COMPARISON:  08/20/2021 FINDINGS: Frontal and lateral views of the chest demonstrate an unremarkable cardiac silhouette. Chronic areas of scarring are seen throughout the right hemithorax, with distortion at the right hilum and volume loss unchanged since prior exams. There is increased central vascular congestion, with scattered ground-glass opacities consistent with mild edema. Trace right pleural effusion. No pneumothorax. IMPRESSION: 1. Mild pulmonary edema superimposed upon background scarring. Electronically Signed   By: Randa Ngo M.D.    On: 09/05/2021 17:23   CT Head Wo Contrast  Result Date: 09/05/2021 CLINICAL DATA:  Mental status changes. EXAM: CT HEAD WITHOUT CONTRAST TECHNIQUE: Contiguous axial images were obtained from the base of the skull through the vertex without intravenous contrast. RADIATION DOSE REDUCTION: This exam was performed according to the departmental dose-optimization program which includes automated exposure control, adjustment of the mA and/or kV according to patient size and/or use of iterative reconstruction technique. COMPARISON:  11/16/2016 FINDINGS: Brain: No evidence of acute infarction, hemorrhage, extra-axial collection, ventriculomegaly, or mass effect. Generalized cerebral atrophy. Periventricular white matter low attenuation likely secondary to microangiopathy. Vascular: Cerebrovascular atherosclerotic calcifications are noted. Skull: Negative for fracture or focal lesion. Sinuses/Orbits: Visualized portions of the orbits are unremarkable. Left maxillary sinus mucosal thickening. Mild sphenoid sinus mucosal thickening. Visualized portions of the mastoid air cells are unremarkable. Other: None. IMPRESSION: 1. No acute intracranial pathology. 2. Chronic microvascular disease and cerebral atrophy. Electronically Signed   By: Kathreen Devoid M.D.   On: 09/05/2021 17:12    Procedures .Critical Care Performed by: Rodney Booze, PA-C Authorized by: Rodney Booze, PA-C   Critical care provider statement:    Critical care time (minutes):  35   Critical care time was exclusive of:  Separately billable procedures and treating other patients   Critical care was necessary to treat or prevent imminent or life-threatening deterioration of the following conditions:  Respiratory failure and cardiac failure   Critical care was time spent personally by me on the following activities:  Blood draw for specimens, development of treatment plan with patient or surrogate, evaluation of patient's response to  treatment, examination of patient, obtaining history from patient or surrogate, review of old charts, re-evaluation of patient's condition, pulse oximetry, ordering and review of radiographic studies, ordering and review of laboratory studies and ordering and performing treatments and interventions   I assumed direction of critical care for this patient from  another provider in my specialty: no     Care discussed with: admitting provider     7:38 PM Cardiac monitoring reveals NSR, HR 80s (Rate & rhythm), as reviewed and interpreted by me. Cardiac monitoring was ordered due to sob and to monitor patient for dysrhythmia.   Medications Ordered in ED Medications  carvedilol (COREG) tablet 12.5 mg (has no administration in time range)  sacubitril-valsartan (ENTRESTO) 49-51 mg per tablet (has no administration in time range)  pantoprazole (PROTONIX) EC tablet 40 mg (has no administration in time range)  apixaban (ELIQUIS) tablet 5 mg (has no administration in time range)  furosemide (LASIX) injection 40 mg (has no administration in time range)  sodium chloride flush (NS) 0.9 % injection 3 mL (has no administration in time range)  ibuprofen (ADVIL) tablet 400 mg (has no administration in time range)  polyethylene glycol (MIRALAX / GLYCOLAX) packet 17 g (has no administration in time range)  ipratropium-albuterol (DUONEB) 0.5-2.5 (3) MG/3ML nebulizer solution 3 mL (3 mLs Nebulization Given 09/05/21 1630)  furosemide (LASIX) injection 40 mg (40 mg Intravenous Given 09/05/21 1827)    ED Course/ Medical Decision Making/ A&P Clinical Course as of 09/05/21 1938  Fri Sep 05, 2021  1722 Hemoglobin(!): 9.5 [JK]    Clinical Course User Index [JK] Dorie Rank, MD                           Medical Decision Making  88 year old female presents the emergency department today for evaluation of hypoxia and concern for possible anemia.  Unable to get labs or other work-up at her facility so was sent here for  further evaluation.  On my evaluation patient desats as low as 83% on room air.  Sats improved in the mid 90s with 2 to 3 L of O2.  Reviewed/interpreted labs CBC is without leukocytosis, anemia is present with a hemoglobin of 9.5, this is improved from 10 days ago when it was measured at 7.2 CMP with hyponatremia which appears worse from prior, elevated co2 at 38 which is new, otherwise normal renal function and lfts BNP elevated at 3000 Trop negative COVID/flu negative  EKG - Sinus rhythm with first degree AV block Borderline right axis deviation Borderline repolarization abnormality Prolonged QT interval   Reviewed/interpreted imaging Cxr - 1. Mild pulmonary edema superimposed upon background scarring. CT ordered due to reports of hallucinations  -neg for acute abnormality  Patient with acute respiratory failure from heart failure.  She is started on BiPAP in the ED for an elevated PCO2.  She does remain alert but given her new oxygen requirement she will require admission.  She was given IV Lasix in the emergency department.  Hospitalist consulted for admission.  7:23 PM CONSULT with Dr. Trilby Drummer who accepts patient for admission    Final Clinical Impression(s) / ED Diagnoses Final diagnoses:  Heart failure, unspecified HF chronicity, unspecified heart failure type [facility])    Rx / DC Orders ED Discharge Orders     None         Bishop Dublin 09/05/21 Lattie Corns    Dorie Rank, MD 09/06/21 1504

## 2021-09-05 NOTE — H&P (Signed)
History and Physical   Helen [name] FOY:[id number] DOB: 1929/09/25 DOA: 09/05/2021  PCP: Vernie Shanks, MD   Patient coming from: [facility]  Chief Complaint: Need for any lab work, hypoxia, confusion  HPI: Helen [name] is a 88 y.o. female with medical history significant of anemia, history of breast cancer, lung scarring status post radiation, peripheral vascular disease, GI bleed, nonischemic cardiomyopathy, hyperlipidemia, hypertension, diabetes, stroke, CHF, carotid artery disease, anxiety, depression who presents with some confusion, hypoxia from facility.  History obtained with assistance of daughter. As above patient is reportedly been experiencing some confusion and possible hallucinations with hypoxia noted at her facility.  EDP spoke with staff at facility reported this.  Patient was seen in December for anemia and received transfusion and was discharged on home oxygen per report.  Has been on 2 L at night only prior to that admission.  Daughter states that she had been seemingly having trouble breathing for the past several days.  EMS was called by facility and due to her hypoxia and shortness of breath she was administered albuterol, Atrovent, 125 Solu-Medrol in route.  She denies fevers, chills, chest pain, abdominal pain, constipation, diarrhea, nausea, vomiting.   ED Course: Vital signs in the ED significant for blood pressure in the 767M to 094B systolic, requiring 2 L to maintain saturations.  Lab work-up showed CMP with sodium 129, chloride 82, bicarb 38, glucose 140, calcium 8.3 which improved considering albumin of 2.8, protein 6.2.  CBC showing hemoglobin stable at 9.5.  BNP elevated to 3071.  Troponin normal on first check and repeat pending.  Respiratory panel flu COVID-negative.  Patient was typed and screened.  Urinalysis pending.  Urine sodium, urine awesome, urine creatinine, serum awesome's are pending.  VBG showed pH normal at 7.35 but PCO2 elevated at 76.   Chest x-ray showed mild pulmonary edema superimposed on scarring.  CT head showed no acute abnormality.  Review of Systems: As per HPI otherwise all other systems reviewed and are negative.  Past Medical History:  Diagnosis Date   Anemia    Anxiety    Cancer ([place])    Breast cancer   Chronic combined systolic (congestive) and diastolic (congestive) heart failure (HCC)    a. EF 25-30% by echo in 11/2016. Recent NST showing no inducible ischemia --> therefore thought to be most consistent with nonischemic cardiomyopathy.    Depression    Diabetes mellitus    History of stroke 11/2016   incidental finding on MRI   Hypercholesteremia    Hypertension    Peripheral vascular disease ([place]) 2015   bilateral moderate CA disease-Dr Bridgett Larsson   Stroke [facility])     Past Surgical History:  Procedure Laterality Date   ABDOMINAL HYSTERECTOMY     APPENDECTOMY  1951   BIOPSY  12/13/2019   Procedure: BIOPSY;  Surgeon: Wilford Corner, MD;  Location: [facility];  Service: Endoscopy;;   [place]  2003   By Dr. Excell Seltzer   COLONOSCOPY WITH PROPOFOL N/A 12/13/2019   Procedure: COLONOSCOPY WITH PROPOFOL;  Surgeon: Wilford Corner, MD;  Location: [facility];  Service: Endoscopy;  Laterality: N/A;   ESOPHAGOGASTRODUODENOSCOPY N/A 12/13/2019   Procedure: ESOPHAGOGASTRODUODENOSCOPY (EGD);  Surgeon: Wilford Corner, MD;  Location: [facility];  Service: Endoscopy;  Laterality: N/A;   ESOPHAGOGASTRODUODENOSCOPY (EGD) WITH PROPOFOL N/A 08/22/2021   Procedure: ESOPHAGOGASTRODUODENOSCOPY (EGD) WITH PROPOFOL;  Surgeon: Ronnette Juniper, MD;  Location: [facility];  Service: Gastroenterology;  Laterality: N/A;  EYE SURGERY Bilateral 2009   Cataract surgery   FRACTURE SURGERY Right    ORIF   by Dr. Burney Gauze   HUMERUS FRACTURE SURGERY     MASTECTOMY Bilateral    NASAL SEPTUM SURGERY  1977   Dr. Ernesto Rutherford   POLYPECTOMY  12/13/2019   Procedure: POLYPECTOMY;  Surgeon:  Wilford Corner, MD;  Location: [facility];  Service: Endoscopy;;   TOENAIL EXCISION      Social History  reports that she quit smoking about 43 years ago. Her smoking use included cigarettes. She has never used smokeless tobacco. She reports that she does not drink alcohol and does not use drugs.  Allergies  Allergen Reactions   Morphine And Related Nausea And Vomiting   Other Other (See Comments)    Other reaction(s): Other (See Comments) Barley & Carrots: learned through allergy testing. Unknown reaction-- MD said not very allergic but not to eat large quantities Barley & Carrots: learned through allergy testing. Unknown reaction-- MD said not very allergic but not to eat large quantities   Sulfa Antibiotics Hives   Sulfasalazine Hives   Nickel Rash    Family History  Problem Relation Age of Onset   Cancer Mother    Pulmonary embolism Mother    Heart disease Father    Heart attack Father    Stroke Father    Diabetes Brother    Hyperlipidemia Brother    Stroke Brother    Kidney disease Brother   Reviewed on admission  Prior to Admission medications   Medication Sig Start Date End Date Taking? Authorizing Provider  BIOTIN PO Take 1 tablet by mouth daily.    [provider]  bismuth subsalicylate (PEPTO BISMOL) 262 MG chewable tablet Chew 524 mg by mouth as needed for indigestion or diarrhea or loose stools.    [provider]  carvedilol (COREG) 12.5 MG tablet Take 1 tablet (12.5 mg total) by mouth 2 (two) times daily with a meal. 08/26/21   Georgette Shell, MD  cholecalciferol (VITAMIN D) 1000 units tablet Take 1,000 Units by mouth every evening.     [provider]  ELIQUIS 5 MG TABS tablet TAKE 1 TABLET BY MOUTH  TWICE DAILY 08/13/21   Skeet Latch, MD  ENTRESTO 49-51 MG TAKE 1 TABLET BY MOUTH  TWICE DAILY 03/28/21   Skeet Latch, MD  furosemide (LASIX) 20 MG tablet TAKE 1 TABLET BY MOUTH  DAILY 03/28/21   Skeet Latch, MD   magnesium oxide (MAG-OX) 400 MG tablet 1 tablet by mouth twice a day for 1 week and then 1 daily Patient taking differently: Take 400 mg by mouth daily. 03/24/18   Skeet Latch, MD  Multiple Vitamin (MULTIVITAMIN WITH MINERALS) TABS tablet Take 1 tablet by mouth daily.     [provider]  ondansetron (ZOFRAN) 4 MG tablet Take 1 tablet (4 mg total) by mouth every 6 (six) hours as needed for nausea. 08/26/21   Georgette Shell, MD  pantoprazole (PROTONIX) 40 MG tablet Take 1 tablet (40 mg total) by mouth 2 (two) times daily before a meal. Patient taking differently: Take 40 mg by mouth daily as needed (acid reflux). 12/14/19   Amin, Jeanella Flattery, MD  senna-docusate (SENOKOT-S) 8.6-50 MG tablet Take 2 tablets by mouth at bedtime as needed for mild constipation or moderate constipation. 12/14/19   Amin, Jeanella Flattery, MD  sucralfate (CARAFATE) 1 g tablet Take 1 tablet (1 g total) by mouth 2 (two) times daily with a meal. 08/26/21  Georgette Shell, MD  vitamin C (ASCORBIC ACID) 500 MG tablet Take 500 mg by mouth daily.     [provider]    Physical Exam: Vitals:   09/05/21 1543 09/05/21 1645 09/05/21 1800 09/05/21 1830  BP: (!) 189/75 135/66 (!) 152/79 (!) 169/84  Pulse: 82 79 70 85  Resp: 20 (!) 27 18 16   Temp: 97.7 F (36.5 C)     TempSrc: Oral     SpO2: 100% 96% 96% 98%  Weight:      Height:       Physical Exam Constitutional:      General: She is not in acute distress.    Appearance: Normal appearance.  HENT:     Head: Normocephalic and atraumatic.     Mouth/Throat:     Mouth: Mucous membranes are moist.     Pharynx: Oropharynx is clear.  Eyes:     Extraocular Movements: Extraocular movements intact.     Pupils: Pupils are equal, round, and reactive to light.  Cardiovascular:     Rate and Rhythm: Normal rate and regular rhythm.     Pulses: Normal pulses.     Heart sounds: Normal heart sounds.  Pulmonary:     Effort: Pulmonary effort is normal. No  respiratory distress.     Breath sounds: Rales (trace) present.     Comments: On BiPAP, some mechanical sounds. Abdominal:     General: Bowel sounds are normal. There is no distension.     Palpations: Abdomen is soft.     Tenderness: There is no abdominal tenderness.  Musculoskeletal:        General: No swelling or deformity.     Right lower leg: Edema (trace) present.     Left lower leg: Edema (trace) present.  Skin:    General: Skin is warm and dry.  Neurological:     General: No focal deficit present.     Comments: Some confusion noted, difficult to assess patient with BiPAP on   Labs on Admission: I have personally reviewed following labs and imaging studies  CBC: Recent Labs  Lab 09/05/21 1644  WBC 8.6  NEUTROABS 7.3  HGB 9.5*  HCT 29.9*  MCV 87.4  PLT 440    Basic Metabolic Panel: Recent Labs  Lab 09/05/21 1644  NA 129*  K 3.9  CL 82*  CO2 38*  GLUCOSE 148*  BUN 10  CREATININE 0.62  CALCIUM 8.3*    GFR: Estimated Creatinine Clearance: 42.1 mL/min (by C-G formula based on SCr of 0.62 mg/dL).  Liver Function Tests: Recent Labs  Lab 09/05/21 1644  AST 27  ALT 39  ALKPHOS 57  BILITOT 0.8  PROT 6.2*  ALBUMIN 2.8*    Urine analysis:    Component Value Date/Time   COLORURINE YELLOW 08/20/2021 [place] 08/20/2021 1431   APPEARANCEUR Clear 03/12/2017 1440   LABSPEC 1.014 08/20/2021 1431   PHURINE 5.0 08/20/2021 1431   GLUCOSEU NEGATIVE 08/20/2021 1431   HGBUR NEGATIVE 08/20/2021 1431   BILIRUBINUR NEGATIVE 08/20/2021 1431   BILIRUBINUR Negative 03/12/2017 1440   KETONESUR NEGATIVE 08/20/2021 1431   PROTEINUR NEGATIVE 08/20/2021 1431   NITRITE NEGATIVE 08/20/2021 1431   LEUKOCYTESUR NEGATIVE 08/20/2021 1431    Radiological Exams on Admission: DG Chest 2 View  Result Date: 09/05/2021 CLINICAL DATA:  Pulmonary edema, history of tobacco abuse EXAM: CHEST - 2 VIEW COMPARISON:  08/20/2021 FINDINGS: Frontal and lateral views of  the chest demonstrate an unremarkable cardiac silhouette. Chronic  areas of scarring are seen throughout the right hemithorax, with distortion at the right hilum and volume loss unchanged since prior exams. There is increased central vascular congestion, with scattered ground-glass opacities consistent with mild edema. Trace right pleural effusion. No pneumothorax. IMPRESSION: 1. Mild pulmonary edema superimposed upon background scarring. Electronically Signed   By: Randa Ngo M.D.   On: 09/05/2021 17:23   CT Head Wo Contrast  Result Date: 09/05/2021 CLINICAL DATA:  Mental status changes. EXAM: CT HEAD WITHOUT CONTRAST TECHNIQUE: Contiguous axial images were obtained from the base of the skull through the vertex without intravenous contrast. RADIATION DOSE REDUCTION: This exam was performed according to the departmental dose-optimization program which includes automated exposure control, adjustment of the mA and/or kV according to patient size and/or use of iterative reconstruction technique. COMPARISON:  11/16/2016 FINDINGS: Brain: No evidence of acute infarction, hemorrhage, extra-axial collection, ventriculomegaly, or mass effect. Generalized cerebral atrophy. Periventricular white matter low attenuation likely secondary to microangiopathy. Vascular: Cerebrovascular atherosclerotic calcifications are noted. Skull: Negative for fracture or focal lesion. Sinuses/Orbits: Visualized portions of the orbits are unremarkable. Left maxillary sinus mucosal thickening. Mild sphenoid sinus mucosal thickening. Visualized portions of the mastoid air cells are unremarkable. Other: None. IMPRESSION: 1. No acute intracranial pathology. 2. Chronic microvascular disease and cerebral atrophy. Electronically Signed   By: Kathreen Devoid M.D.   On: 09/05/2021 17:12    EKG: Independently reviewed.  Sinus rhythm at 93 bpm.  PAC noted.  First-degree AV block.  Prolonged QTC at 593.  Low voltage in multiple  leads.  Assessment/Plan Principal Problem:   CHF exacerbation (HCC) Active Problems:   Diabetes mellitus without complication ([place])   Acute encephalopathy   Essential hypertension   Anemia of chronic disease   Acute respiratory failure with hypoxia and hypercarbia (HCC)  Acute cephalopathy CHF exacerbation Acute respiratory failure with hypoxia and hypercarbia > Patient presenting with some confusion and hypoxia at her facility. > Requiring 2 L to maintain saturations in the ED.  Noted to have hypercarbia and started on BiPAP. > Patient has been on oxygen since admit for anemia in December. > In route patient received inhalers, steroids. > Noted to have normal hemoglobin, no leukocytosis, elevated bicarb, elevated PCO2 of 76 on VBG with normal pH.  BNP elevated to 3071.  Chest x-ray with mild pulmonary edema superimposed on scarring. > Findings consistent with CHF exacerbation. > Unable to find recent echo in chart but in certain chart there are notes entered that show last echo showed EF of 50-55%. - Monitor on progressive unit with as needed BiPAP - Continue with Lasix 40 mg IV twice daily - Strict I's and O's, daily weights - Echocardiogram - Check magnesium - Continue home carvedilol and Entresto  Hyponatremia > Mild hyponatremia likely hypervolemic hyponatremia in the setting of CHF exacerbation as above. > Labs checked in the ED which are pending include urine sodium, urine awesome, urine creatinine, serum awesome's. - Trend sodium - Follow-up urinary labs and serum awesome's  Hypertension - Continue home carvedilol and Entresto - Lasix as above  Diabetes -SSI  History of CVA Hyperlipidemia PVD Carotid artery disease - Not currently on medication for cholesterol - Continue home eliquis  DVT prophylaxis: Eliquis Code Status:   DNR Family Communication:  Daughter updated at bedside. Disposition Plan:   Patient is from:  [place]  to:  Same as above  Anticipated DC date:  1 to 4 days  Anticipated DC barriers: None  Consults  called:  None  Admission status:  Observation, progressive  Severity of Illness: The appropriate patient status for this patient is OBSERVATION. Observation status is judged to be reasonable and necessary in order to provide the required intensity of service to ensure the patient's safety. The patient's presenting symptoms, physical exam findings, and initial radiographic and laboratory data in the context of their medical condition is felt to place them at decreased risk for further clinical deterioration. Furthermore, it is anticipated that the patient will be medically stable for discharge from the hospital within 2 midnights of admission.    Marcelyn Bruins MD [facility]  How to contact the [facility] Attending or Consulting provider [place] or covering provider during after hours [place], for this patient?   Check the care team in [facility] and look for a) attending/consulting [facility] provider listed and b) the [facility] team listed Log into [email or web address] and use [place]'s universal password to access. If you do not have the password, please contact the hospital operator. Locate the [facility] provider you are looking for under [facility] and page to a number that you can be directly reached. If you still have difficulty reaching the provider, please page the [facility] (Director on Call) for the Hospitalists listed on amion for assistance.  09/05/2021, 7:53 PM

## 2021-09-06 ENCOUNTER — Observation Stay (HOSPITAL_BASED_OUTPATIENT_CLINIC_OR_DEPARTMENT_OTHER): Payer: Medicare Other

## 2021-09-06 DIAGNOSIS — F32A Depression, unspecified: Secondary | ICD-10-CM | POA: Diagnosis present

## 2021-09-06 DIAGNOSIS — R41841 Cognitive communication deficit: Secondary | ICD-10-CM | POA: Diagnosis not present

## 2021-09-06 DIAGNOSIS — J9611 Chronic respiratory failure with hypoxia: Secondary | ICD-10-CM | POA: Diagnosis not present

## 2021-09-06 DIAGNOSIS — G9340 Encephalopathy, unspecified: Secondary | ICD-10-CM | POA: Diagnosis not present

## 2021-09-06 DIAGNOSIS — E873 Alkalosis: Secondary | ICD-10-CM | POA: Diagnosis present

## 2021-09-06 DIAGNOSIS — R262 Difficulty in walking, not elsewhere classified: Secondary | ICD-10-CM | POA: Diagnosis not present

## 2021-09-06 DIAGNOSIS — J441 Chronic obstructive pulmonary disease with (acute) exacerbation: Secondary | ICD-10-CM | POA: Diagnosis present

## 2021-09-06 DIAGNOSIS — R1312 Dysphagia, oropharyngeal phase: Secondary | ICD-10-CM | POA: Diagnosis not present

## 2021-09-06 DIAGNOSIS — I110 Hypertensive heart disease with heart failure: Secondary | ICD-10-CM | POA: Diagnosis present

## 2021-09-06 DIAGNOSIS — Z8673 Personal history of transient ischemic attack (TIA), and cerebral infarction without residual deficits: Secondary | ICD-10-CM | POA: Diagnosis not present

## 2021-09-06 DIAGNOSIS — I739 Peripheral vascular disease, unspecified: Secondary | ICD-10-CM | POA: Diagnosis not present

## 2021-09-06 DIAGNOSIS — R2689 Other abnormalities of gait and mobility: Secondary | ICD-10-CM | POA: Diagnosis not present

## 2021-09-06 DIAGNOSIS — Z83438 Family history of other disorder of lipoprotein metabolism and other lipidemia: Secondary | ICD-10-CM | POA: Diagnosis not present

## 2021-09-06 DIAGNOSIS — Z853 Personal history of malignant neoplasm of breast: Secondary | ICD-10-CM | POA: Diagnosis not present

## 2021-09-06 DIAGNOSIS — E7800 Pure hypercholesterolemia, unspecified: Secondary | ICD-10-CM | POA: Diagnosis present

## 2021-09-06 DIAGNOSIS — I480 Paroxysmal atrial fibrillation: Secondary | ICD-10-CM | POA: Diagnosis present

## 2021-09-06 DIAGNOSIS — E119 Type 2 diabetes mellitus without complications: Secondary | ICD-10-CM | POA: Diagnosis not present

## 2021-09-06 DIAGNOSIS — E861 Hypovolemia: Secondary | ICD-10-CM | POA: Diagnosis present

## 2021-09-06 DIAGNOSIS — J9601 Acute respiratory failure with hypoxia: Secondary | ICD-10-CM | POA: Diagnosis present

## 2021-09-06 DIAGNOSIS — E876 Hypokalemia: Secondary | ICD-10-CM | POA: Diagnosis present

## 2021-09-06 DIAGNOSIS — I5023 Acute on chronic systolic (congestive) heart failure: Secondary | ICD-10-CM | POA: Diagnosis not present

## 2021-09-06 DIAGNOSIS — G928 Other toxic encephalopathy: Secondary | ICD-10-CM | POA: Diagnosis present

## 2021-09-06 DIAGNOSIS — M6281 Muscle weakness (generalized): Secondary | ICD-10-CM | POA: Diagnosis not present

## 2021-09-06 DIAGNOSIS — G9341 Metabolic encephalopathy: Secondary | ICD-10-CM | POA: Diagnosis not present

## 2021-09-06 DIAGNOSIS — R0609 Other forms of dyspnea: Secondary | ICD-10-CM | POA: Diagnosis not present

## 2021-09-06 DIAGNOSIS — Z8249 Family history of ischemic heart disease and other diseases of the circulatory system: Secondary | ICD-10-CM | POA: Diagnosis not present

## 2021-09-06 DIAGNOSIS — Z7401 Bed confinement status: Secondary | ICD-10-CM | POA: Diagnosis not present

## 2021-09-06 DIAGNOSIS — E222 Syndrome of inappropriate secretion of antidiuretic hormone: Secondary | ICD-10-CM | POA: Diagnosis present

## 2021-09-06 DIAGNOSIS — I5043 Acute on chronic combined systolic (congestive) and diastolic (congestive) heart failure: Secondary | ICD-10-CM | POA: Diagnosis present

## 2021-09-06 DIAGNOSIS — E1151 Type 2 diabetes mellitus with diabetic peripheral angiopathy without gangrene: Secondary | ICD-10-CM | POA: Diagnosis present

## 2021-09-06 DIAGNOSIS — Z66 Do not resuscitate: Secondary | ICD-10-CM | POA: Diagnosis present

## 2021-09-06 DIAGNOSIS — I428 Other cardiomyopathies: Secondary | ICD-10-CM | POA: Diagnosis present

## 2021-09-06 DIAGNOSIS — J9602 Acute respiratory failure with hypercapnia: Secondary | ICD-10-CM | POA: Diagnosis present

## 2021-09-06 DIAGNOSIS — E871 Hypo-osmolality and hyponatremia: Secondary | ICD-10-CM | POA: Diagnosis not present

## 2021-09-06 DIAGNOSIS — I10 Essential (primary) hypertension: Secondary | ICD-10-CM | POA: Diagnosis not present

## 2021-09-06 DIAGNOSIS — Z20822 Contact with and (suspected) exposure to covid-19: Secondary | ICD-10-CM | POA: Diagnosis present

## 2021-09-06 DIAGNOSIS — R102 Pelvic and perineal pain: Secondary | ICD-10-CM | POA: Diagnosis not present

## 2021-09-06 DIAGNOSIS — D638 Anemia in other chronic diseases classified elsewhere: Secondary | ICD-10-CM | POA: Diagnosis present

## 2021-09-06 DIAGNOSIS — Z87891 Personal history of nicotine dependence: Secondary | ICD-10-CM | POA: Diagnosis not present

## 2021-09-06 DIAGNOSIS — R498 Other voice and resonance disorders: Secondary | ICD-10-CM | POA: Diagnosis not present

## 2021-09-06 DIAGNOSIS — I509 Heart failure, unspecified: Secondary | ICD-10-CM | POA: Diagnosis present

## 2021-09-06 DIAGNOSIS — Z833 Family history of diabetes mellitus: Secondary | ICD-10-CM | POA: Diagnosis not present

## 2021-09-06 DIAGNOSIS — R531 Weakness: Secondary | ICD-10-CM | POA: Diagnosis not present

## 2021-09-06 LAB — ECHOCARDIOGRAM COMPLETE
AR max vel: 0.9 cm2
AV Area VTI: 0.74 cm2
AV Area mean vel: 0.89 cm2
AV Mean grad: 6 mmHg
AV Peak grad: 11.4 mmHg
Ao pk vel: 1.69 m/s
Area-P 1/2: 5.31 cm2
Height: 64.5 in
MV VTI: 0.71 cm2
S' Lateral: 2.9 cm
Weight: 2288 oz

## 2021-09-06 LAB — CBC
HCT: 29.6 % — ABNORMAL LOW (ref 36.0–46.0)
Hemoglobin: 9.5 g/dL — ABNORMAL LOW (ref 12.0–15.0)
MCH: 27.5 pg (ref 26.0–34.0)
MCHC: 32.1 g/dL (ref 30.0–36.0)
MCV: 85.5 fL (ref 80.0–100.0)
Platelets: 162 10*3/uL (ref 150–400)
RBC: 3.46 MIL/uL — ABNORMAL LOW (ref 3.87–5.11)
RDW: 17.3 % — ABNORMAL HIGH (ref 11.5–15.5)
WBC: 8 10*3/uL (ref 4.0–10.5)
nRBC: 0 % (ref 0.0–0.2)

## 2021-09-06 LAB — COMPREHENSIVE METABOLIC PANEL
ALT: 36 U/L (ref 0–44)
AST: 23 U/L (ref 15–41)
Albumin: 2.8 g/dL — ABNORMAL LOW (ref 3.5–5.0)
Alkaline Phosphatase: 56 U/L (ref 38–126)
Anion gap: 11 (ref 5–15)
BUN: 12 mg/dL (ref 8–23)
CO2: 40 mmol/L — ABNORMAL HIGH (ref 22–32)
Calcium: 8.6 mg/dL — ABNORMAL LOW (ref 8.9–10.3)
Chloride: 79 mmol/L — ABNORMAL LOW (ref 98–111)
Creatinine, Ser: 0.62 mg/dL (ref 0.44–1.00)
GFR, Estimated: >60 mL/min (ref >60)
Glucose, Bld: 129 mg/dL — ABNORMAL HIGH (ref 70–99)
Potassium: 3.6 mmol/L (ref 3.5–5.1)
Sodium: 130 mmol/L — ABNORMAL LOW (ref 135–145)
Total Bilirubin: 0.8 mg/dL (ref 0.3–1.2)
Total Protein: 6.4 g/dL — ABNORMAL LOW (ref 6.5–8.1)

## 2021-09-06 LAB — OSMOLALITY, URINE: Osmolality, Ur: 333 mOsm/kg (ref 300–900)

## 2021-09-06 LAB — GLUCOSE, CAPILLARY: Glucose-Capillary: 197 mg/dL — ABNORMAL HIGH (ref 70–99)

## 2021-09-06 LAB — CBG MONITORING, ED
Glucose-Capillary: 133 mg/dL — ABNORMAL HIGH (ref 70–99)
Glucose-Capillary: 141 mg/dL — ABNORMAL HIGH (ref 70–99)
Glucose-Capillary: 134 mg/dL — ABNORMAL HIGH (ref 70–99)
Glucose-Capillary: 114 mg/dL — ABNORMAL HIGH (ref 70–99)

## 2021-09-06 LAB — OSMOLALITY: Osmolality: 276 mOsm/kg (ref 275–295)

## 2021-09-06 NOTE — Progress Notes (Signed)
Echocardiogram 2D Echocardiogram has been performed.  Arlyss Gandy 09/06/2021, 11:04 AM

## 2021-09-06 NOTE — ED Notes (Signed)
Spoke to pt's daughter [name]. Provided brief update.

## 2021-09-06 NOTE — Progress Notes (Signed)
Removed BIPAP mask and placed pt on 2L [place].  Pt is tolerating well. RN informed.

## 2021-09-07 ENCOUNTER — Inpatient Hospital Stay (HOSPITAL_COMMUNITY): Payer: Medicare Other

## 2021-09-07 DIAGNOSIS — G9340 Encephalopathy, unspecified: Secondary | ICD-10-CM | POA: Diagnosis not present

## 2021-09-07 DIAGNOSIS — J9601 Acute respiratory failure with hypoxia: Secondary | ICD-10-CM | POA: Diagnosis not present

## 2021-09-07 DIAGNOSIS — I5023 Acute on chronic systolic (congestive) heart failure: Secondary | ICD-10-CM | POA: Diagnosis not present

## 2021-09-07 DIAGNOSIS — E871 Hypo-osmolality and hyponatremia: Secondary | ICD-10-CM

## 2021-09-07 DIAGNOSIS — R102 Pelvic and perineal pain: Secondary | ICD-10-CM

## 2021-09-07 DIAGNOSIS — D638 Anemia in other chronic diseases classified elsewhere: Secondary | ICD-10-CM | POA: Diagnosis not present

## 2021-09-07 LAB — URINALYSIS, ROUTINE W REFLEX MICROSCOPIC
Bacteria, UA: NONE SEEN
Bilirubin Urine: NEGATIVE
Glucose, UA: NEGATIVE mg/dL
Ketones, ur: NEGATIVE mg/dL
Leukocytes,Ua: NEGATIVE
Nitrite: NEGATIVE
Protein, ur: NEGATIVE mg/dL
Specific Gravity, Urine: 1.01 (ref 1.005–1.030)
pH: 7 (ref 5.0–8.0)

## 2021-09-07 LAB — CBC WITH DIFFERENTIAL/PLATELET
Abs Immature Granulocytes: 0.11 10*3/uL — ABNORMAL HIGH (ref 0.00–0.07)
Basophils Absolute: 0 10*3/uL (ref 0.0–0.1)
Basophils Relative: 0 %
Eosinophils Absolute: 0.1 10*3/uL (ref 0.0–0.5)
Eosinophils Relative: 1 %
HCT: 30.5 % — ABNORMAL LOW (ref 36.0–46.0)
Hemoglobin: 9.7 g/dL — ABNORMAL LOW (ref 12.0–15.0)
Immature Granulocytes: 1 %
Lymphocytes Relative: 7 %
Lymphs Abs: 0.8 10*3/uL (ref 0.7–4.0)
MCH: 27.3 pg (ref 26.0–34.0)
MCHC: 31.8 g/dL (ref 30.0–36.0)
MCV: 85.9 fL (ref 80.0–100.0)
Monocytes Absolute: 0.7 10*3/uL (ref 0.1–1.0)
Monocytes Relative: 6 %
Neutro Abs: 9 10*3/uL — ABNORMAL HIGH (ref 1.7–7.7)
Neutrophils Relative %: 85 %
Platelets: 172 10*3/uL (ref 150–400)
RBC: 3.55 MIL/uL — ABNORMAL LOW (ref 3.87–5.11)
RDW: 17.7 % — ABNORMAL HIGH (ref 11.5–15.5)
WBC: 10.6 10*3/uL — ABNORMAL HIGH (ref 4.0–10.5)
nRBC: 0 % (ref 0.0–0.2)

## 2021-09-07 LAB — GLUCOSE, CAPILLARY
Glucose-Capillary: 120 mg/dL — ABNORMAL HIGH (ref 70–99)
Glucose-Capillary: 94 mg/dL (ref 70–99)
Glucose-Capillary: 193 mg/dL — ABNORMAL HIGH (ref 70–99)
Glucose-Capillary: 109 mg/dL — ABNORMAL HIGH (ref 70–99)
Glucose-Capillary: 129 mg/dL — ABNORMAL HIGH (ref 70–99)

## 2021-09-07 LAB — BRAIN NATRIURETIC PEPTIDE: B Natriuretic Peptide: 854.6 pg/mL — ABNORMAL HIGH (ref 0.0–100.0)

## 2021-09-07 LAB — BASIC METABOLIC PANEL
Anion gap: 11 (ref 5–15)
BUN: 21 mg/dL (ref 8–23)
CO2: 40 mmol/L — ABNORMAL HIGH (ref 22–32)
Calcium: 8.3 mg/dL — ABNORMAL LOW (ref 8.9–10.3)
Chloride: 76 mmol/L — ABNORMAL LOW (ref 98–111)
Creatinine, Ser: 0.75 mg/dL (ref 0.44–1.00)
GFR, Estimated: >60 mL/min (ref >60)
Glucose, Bld: 199 mg/dL — ABNORMAL HIGH (ref 70–99)
Potassium: 2.8 mmol/L — ABNORMAL LOW (ref 3.5–5.1)
Sodium: 127 mmol/L — ABNORMAL LOW (ref 135–145)

## 2021-09-07 MED ORDER — ESTROGENS CONJUGATED 0.625 MG/GM VA CREA
1.0000 | TOPICAL_CREAM | Freq: Every day | VAGINAL | Status: DC
  Administered 2021-09-07 – 2021-09-14 (×7): 1 via VAGINAL
  Filled 2021-09-07: qty 30

## 2021-09-07 MED ORDER — ACETAMINOPHEN 325 MG PO TABS
650.0000 mg | ORAL_TABLET | Freq: Four times a day (QID) | ORAL | Status: DC | PRN
  Administered 2021-09-07 – 2021-09-13 (×7): 650 mg via ORAL
  Filled 2021-09-07 (×8): qty 2

## 2021-09-07 MED ORDER — SODIUM CHLORIDE 1 G PO TABS
1.0000 g | ORAL_TABLET | Freq: Three times a day (TID) | ORAL | Status: DC
  Administered 2021-09-07 – 2021-09-15 (×25): 1 g via ORAL
  Filled 2021-09-07 (×24): qty 1

## 2021-09-07 MED ORDER — INSULIN ASPART 100 UNIT/ML IJ SOLN
0.0000 [IU] | Freq: Three times a day (TID) | INTRAMUSCULAR | Status: DC
  Administered 2021-09-07: 2 [IU] via SUBCUTANEOUS
  Administered 2021-09-08 – 2021-09-13 (×11): 1 [IU] via SUBCUTANEOUS

## 2021-09-07 MED ORDER — POTASSIUM CHLORIDE CRYS ER 20 MEQ PO TBCR
40.0000 meq | EXTENDED_RELEASE_TABLET | ORAL | Status: AC
  Administered 2021-09-07 (×2): 40 meq via ORAL
  Filled 2021-09-07 (×2): qty 2

## 2021-09-07 NOTE — Progress Notes (Signed)
PROGRESS NOTE  Helen [name] BSJ:[id number] DOB: 1930-06-24 DOA: 09/05/2021 PCP: Vernie Shanks, MD  Brief History    Helen [name] is a 88 y.o. female with medical history significant of anemia, history of breast cancer, lung scarring status post radiation, peripheral vascular disease, GI bleed, nonischemic cardiomyopathy, hyperlipidemia, hypertension, diabetes, stroke, CHF, carotid artery disease, anxiety, depression who presents with some confusion, hypoxia from facility.  History obtained with assistance of daughter. As above patient is reportedly been experiencing some confusion and possible hallucinations with hypoxia noted at her facility.  EDP spoke with staff at facility reported this.  Patient was seen in December for anemia and received transfusion and was discharged on home oxygen per report.  Has been on 2 L at night only prior to that admission.  Daughter states that she had been seemingly having trouble breathing for the past several days.  EMS was called by facility and due to her hypoxia and shortness of breath she was administered albuterol, Atrovent, 125 Solu-Medrol in route.  She denies fevers, chills, chest pain, abdominal pain, constipation, diarrhea, nausea, vomiting.    ED Course: Vital signs in the ED significant for blood pressure in the 765Y to 650P systolic, requiring 2 L to maintain saturations.  Lab work-up showed CMP with sodium 129, chloride 82, bicarb 38, glucose 140, calcium 8.3 which improved considering albumin of 2.8, protein 6.2.  CBC showing hemoglobin stable at 9.5.  BNP elevated to 3071.  Troponin normal on first check and repeat pending.  Respiratory panel flu COVID-negative.  Patient was typed and screened.  Urinalysis pending.  Urine sodium, urine awesome, urine creatinine, serum awesome's are pending.  VBG showed pH normal at 7.35 but PCO2 elevated at 76.  Chest x-ray showed mild pulmonary edema superimposed on scarring.  CT head showed no acute  abnormality.  [facility] were consulted to admit the patient for further evaluation and care.  Consultants    Procedures    Antibiotics   Anti-infectives (From admission, onward)    None       Subjective  The patient is resting comfortably. No new complaints.   Objective   Vitals:  Vitals:   09/07/21 0852 09/07/21 0854  BP:    Pulse:    Resp:    Temp:    SpO2: (!) 87% 94%    Exam:  Constitutional:  The patient is awake, alert, and oriented x 3. No acute distress. Respiratory:  No increased work of breathing. No wheezes, rales, or rhonchi No tactile fremitus Cardiovascular:  Regular rate and rhythm No murmurs, ectopy, or gallups. No lateral PMI. No thrills. Abdomen:  Abdomen is soft, non-tender, non-distended No hernias, masses, or organomegaly Normoactive bowel sounds.  Musculoskeletal:  No cyanosis, clubbing, or edema Skin:  No rashes, lesions, ulcers palpation of skin: no induration or nodules Neurologic:  CN 2-12 intact Sensation all 4 extremities intact Psychiatric:  Mental status Mood, affect appropriate Orientation to person, place, time  judgment and insight appear intact   I have personally reviewed the following:   Today's Data  Vitals  Lab Data  CMP CBC  Micro Data    Imaging  CT head CXR  Cardiology Data  EKG Echocardiogram  Other Data    Scheduled Meds:  apixaban  5 mg Oral BID   carvedilol  12.5 mg Oral BID WC   furosemide  40 mg Intravenous BID   insulin aspart  0-9 Units Subcutaneous Q4H   pantoprazole  40 mg Oral Daily  sacubitril-valsartan  1 tablet Oral BID   sodium chloride flush  3 mL Intravenous Q12H    Principal Problem:   CHF exacerbation (HCC) Active Problems:   Diabetes mellitus without complication (HCC)   Peripheral vascular disease (HCC)   Acute encephalopathy   Essential hypertension   Anemia of chronic disease   Acute respiratory failure with hypoxia and hypercarbia (HCC)    Acute respiratory failure with hypoxia (HCC)   LOS: 1 day   A & P  Acute cephalopathy CHF exacerbation Acute respiratory failure with hypoxia and hypercarbia > Patient presenting with some confusion and hypoxia at her facility. > Requiring 2 L to maintain saturations in the ED.  Noted to have hypercarbia and started on BiPAP. > Patient has been on oxygen since admit for anemia in December. > In route patient received inhalers, steroids. > Noted to have normal hemoglobin, no leukocytosis, elevated bicarb, elevated PCO2 of 76 on VBG with normal pH.  BNP elevated to 3071.  Chest x-ray with mild pulmonary edema superimposed on scarring. > Findings consistent with CHF exacerbation. > Unable to find recent echo in chart but in certain chart there are notes entered that show last echo showed EF of 50-55%. - Monitor on progressive unit with as needed BiPAP - Continue with Lasix 40 mg IV twice daily - Strict I's and O's, daily weights - Echocardiogram - Check magnesium - Continue home carvedilol and Entresto   Hyponatremia > Mild hyponatremia likely hypervolemic hyponatremia in the setting of CHF exacerbation as above. > Labs checked in the ED which are pending include urine sodium, urine awesome, urine creatinine, serum awesome's. - Trend sodium - Follow-up urinary labs and serum awesome's   Hypertension - Continue home carvedilol and Entresto - Lasix as above   Diabetes -SSI  History of CVA Hyperlipidemia PVD Carotid artery disease - Not currently on medication for cholesterol - Continue home eliquis   I have seen and examined this patient myself. I have spent 34 minutes in her evaluation and care.  DVT prophylaxis:      Eliquis Code Status:              DNR Family Communication:       Daughter updated at bedside. Disposition Plan:              Patient is from:                        [place] to:                   Same as above              Anticipated DC date:               1 to 4 days             Anticipated DC barriers:         None      Helen Yoshida, DO [facility] Direct contact: see [email or web address]  7PM-7AM contact night coverage as above 09/06/2021, 12:56 PM  LOS: 1 day

## 2021-09-07 NOTE — Progress Notes (Signed)
PROGRESS NOTE  Helen [name] TKP:[id number] DOB: 1929-11-21 DOA: 09/05/2021 PCP: Vernie Shanks, MD  Brief History    Helen [name] is a 88 y.o. female with medical history significant of anemia, history of breast cancer, lung scarring status post radiation, peripheral vascular disease, GI bleed, nonischemic cardiomyopathy, hyperlipidemia, hypertension, diabetes, stroke, CHF, carotid artery disease, anxiety, depression who presents with some confusion, hypoxia from facility.  History obtained with assistance of daughter. As above patient is reportedly been experiencing some confusion and possible hallucinations with hypoxia noted at her facility.  EDP spoke with staff at facility reported this.  Patient was seen in December for anemia and received transfusion and was discharged on home oxygen per report.  Has been on 2 L at night only prior to that admission.  Daughter states that she had been seemingly having trouble breathing for the past several days.  EMS was called by facility and due to her hypoxia and shortness of breath she was administered albuterol, Atrovent, 125 Solu-Medrol in route.  She denies fevers, chills, chest pain, abdominal pain, constipation, diarrhea, nausea, vomiting.    ED Course: Vital signs in the ED significant for blood pressure in the 517G to 017C systolic, requiring 2 L to maintain saturations.  Lab work-up showed CMP with sodium 129, chloride 82, bicarb 38, glucose 140, calcium 8.3 which improved considering albumin of 2.8, protein 6.2.  CBC showing hemoglobin stable at 9.5.  BNP elevated to 3071.  Troponin normal on first check and repeat pending.  Respiratory panel flu COVID-negative.  Patient was typed and screened.  Urinalysis pending.  Urine sodium, urine awesome, urine creatinine, serum awesome's are pending.  VBG showed pH normal at 7.35 but PCO2 elevated at 76.  Chest x-ray showed mild pulmonary edema superimposed on scarring.  CT head showed no acute  abnormality.  [facility] were consulted to admit the patient for further evaluation and care.  This morning the patient is complaining of pain in her vagina, as well as right sided pelvic pain. Concern for renal lithiasis. Consultants    Procedures    Antibiotics   Anti-infectives (From admission, onward)    None       Subjective  The patient is resting comfortably. Complaining of vaginal/pelvic pain.  Objective   Vitals:  Vitals:   09/07/21 0854 09/07/21 1248  BP:  119/69  Pulse:  (!) 42  Resp:  19  Temp:  (!) 97.3 F (36.3 C)  SpO2: 94% 94%    Exam:  Constitutional:  The patient is awake, alert, and oriented x 3. Moderate distress from vaginal pelvic pain. Respiratory:  No increased work of breathing. No wheezes, rales, or rhonchi No tactile fremitus Cardiovascular:  Regular rate and rhythm No murmurs, ectopy, or gallups. No lateral PMI. No thrills. Abdomen:  Abdomen is soft, non-tender, non-distended No hernias, masses, or organomegaly Normoactive bowel sounds.  Musculoskeletal:  No cyanosis, clubbing, or edema Genitourinary: External genitalia (labia majora/minora) demonstrates mild erythema. No lesions. Introitus of vagina: No erythema. No lesions. Skin:  No rashes, lesions, ulcers palpation of skin: no induration or nodules Neurologic:  CN 2-12 intact Sensation all 4 extremities intact Psychiatric:  Mental status Mood, affect appropriate Orientation to person, place, time  judgment and insight appear intact   I have personally reviewed the following:   Today's Data  Vitals  Lab Data  BMP CBC  Micro Data    Imaging  CT head CXR  Cardiology Data  EKG Echocardiogram  Other Data  Scheduled Meds:  apixaban  5 mg Oral BID   carvedilol  12.5 mg Oral BID WC   conjugated estrogens  1 Applicatorful Vaginal QHS   furosemide  40 mg Intravenous BID   insulin aspart  0-9 Units Subcutaneous TID AC & HS   pantoprazole   40 mg Oral Daily   potassium chloride  40 mEq Oral Q4H   sacubitril-valsartan  1 tablet Oral BID   sodium chloride flush  3 mL Intravenous Q12H    Principal Problem:   CHF exacerbation (HCC) Active Problems:   Diabetes mellitus without complication (HCC)   Peripheral vascular disease (HCC)   Acute encephalopathy   Essential hypertension   Anemia of chronic disease   Acute respiratory failure with hypoxia and hypercarbia (HCC)   Acute respiratory failure with hypoxia ([place])   LOS: 1 day   A & P  Acute cephalopathy: Clearing. Pt presented with confusion and disorientation from her facility. Pt is awake, alert, and oriented x 3.  Acute respiratory failure with hypoxia and hypercarbia: She is currently saturating at 94% on 2L by nasal canula. Initially the patient required BIPAP due to PCO2 of 76. Now on nasal cannula.    CHF exacerbation: BNP elevated to 3071 on admission.  Chest x-ray with mild pulmonary edema superimposed on scarring. > Findings consistent with CHF exacerbation. >Echocardiogram has demonstrated 45-50% with mildly decreased function. Diastolic function is indeterminate RV systolic function is preserved. Lt atrium is moderately dilated. There is moderate mitral valve regurgitation. There is likely mild to moderate aortic valve stenosis. The patient is being monitored on telemetry. She is receiving lasix 40 mg IV BID. Monitor volume status, creatinine, and electrolytes. Continue home carvedilol and Entresto.   Hyponatremia: Due to SIADH. Na 127 today. Urine sodium is 108. The patient has been placed on a 1200 cc fluid restriction. NACl to replace sodium excreted due to lasix.   Hypertension Low BP this am at 0501 at 81/42. Now patient normotensive on Entresto and carvedilol. Monitor.   Diabetes -SSI  History of CVA Hyperlipidemia PVD Carotid artery disease - Not currently on medication for cholesterol - Continue home eliquis   I have seen and examined this  patient myself. I have spent 32 minutes in her evaluation and care.  DVT prophylaxis:      Eliquis Code Status:              DNR Family Communication:       Daughter updated at bedside. Disposition Plan:              Patient is from:                        [place] to:                   Same as above             Anticipated DC date:               1 to 4 days             Anticipated DC barriers:         None      Kyree Fedorko, DO [facility] Direct contact: see [email or web address]  7PM-7AM contact night coverage as above 09/07/2021, 2:23 PM  LOS: 1 day

## 2021-09-07 NOTE — Progress Notes (Signed)
Attempted to wean pt to RA. O2 sats 85-88 on RA. Pt returned to 2L O2 and sats improved to 94%.

## 2021-09-08 DIAGNOSIS — J9601 Acute respiratory failure with hypoxia: Secondary | ICD-10-CM | POA: Diagnosis not present

## 2021-09-08 DIAGNOSIS — G9340 Encephalopathy, unspecified: Secondary | ICD-10-CM | POA: Diagnosis not present

## 2021-09-08 DIAGNOSIS — I509 Heart failure, unspecified: Secondary | ICD-10-CM | POA: Diagnosis not present

## 2021-09-08 DIAGNOSIS — D638 Anemia in other chronic diseases classified elsewhere: Secondary | ICD-10-CM | POA: Diagnosis not present

## 2021-09-08 LAB — CBC WITH DIFFERENTIAL/PLATELET
Abs Immature Granulocytes: 0.1 10*3/uL — ABNORMAL HIGH (ref 0.00–0.07)
Basophils Absolute: 0 10*3/uL (ref 0.0–0.1)
Basophils Relative: 0 %
Eosinophils Absolute: 0.1 10*3/uL (ref 0.0–0.5)
Eosinophils Relative: 2 %
HCT: 30.8 % — ABNORMAL LOW (ref 36.0–46.0)
Hemoglobin: 9.9 g/dL — ABNORMAL LOW (ref 12.0–15.0)
Immature Granulocytes: 1 %
Lymphocytes Relative: 12 %
Lymphs Abs: 1.1 10*3/uL (ref 0.7–4.0)
MCH: 27.7 pg (ref 26.0–34.0)
MCHC: 32.1 g/dL (ref 30.0–36.0)
MCV: 86 fL (ref 80.0–100.0)
Monocytes Absolute: 0.9 10*3/uL (ref 0.1–1.0)
Monocytes Relative: 9 %
Neutro Abs: 6.9 10*3/uL (ref 1.7–7.7)
Neutrophils Relative %: 76 %
Platelets: 152 10*3/uL (ref 150–400)
RBC: 3.58 MIL/uL — ABNORMAL LOW (ref 3.87–5.11)
RDW: 17.9 % — ABNORMAL HIGH (ref 11.5–15.5)
WBC: 9.1 10*3/uL (ref 4.0–10.5)
nRBC: 0 % (ref 0.0–0.2)

## 2021-09-08 LAB — BASIC METABOLIC PANEL
Anion gap: 10 (ref 5–15)
BUN: 23 mg/dL (ref 8–23)
CO2: 39 mmol/L — ABNORMAL HIGH (ref 22–32)
Calcium: 8.6 mg/dL — ABNORMAL LOW (ref 8.9–10.3)
Chloride: 81 mmol/L — ABNORMAL LOW (ref 98–111)
Creatinine, Ser: 1.08 mg/dL — ABNORMAL HIGH (ref 0.44–1.00)
GFR, Estimated: 49 mL/min — ABNORMAL LOW (ref >60)
Glucose, Bld: 114 mg/dL — ABNORMAL HIGH (ref 70–99)
Potassium: 4.2 mmol/L (ref 3.5–5.1)
Sodium: 130 mmol/L — ABNORMAL LOW (ref 135–145)

## 2021-09-08 LAB — GLUCOSE, CAPILLARY
Glucose-Capillary: 124 mg/dL — ABNORMAL HIGH (ref 70–99)
Glucose-Capillary: 134 mg/dL — ABNORMAL HIGH (ref 70–99)
Glucose-Capillary: 112 mg/dL — ABNORMAL HIGH (ref 70–99)
Glucose-Capillary: 101 mg/dL — ABNORMAL HIGH (ref 70–99)

## 2021-09-08 MED ORDER — FUROSEMIDE 10 MG/ML IJ SOLN
40.0000 mg | Freq: Every day | INTRAMUSCULAR | Status: DC
  Administered 2021-09-09 – 2021-09-10 (×2): 40 mg via INTRAVENOUS
  Filled 2021-09-08 (×2): qty 4

## 2021-09-08 NOTE — Progress Notes (Signed)
Patient with soft BP in the 80s, patient in bed resting, denies any distress. Dr. Benny Lennert notified, will continue to assess patient.

## 2021-09-08 NOTE — Progress Notes (Addendum)
PROGRESS NOTE  Helen [name] [id number] DOB: Jun 26, 1930 DOA: 09/05/2021 PCP: Vernie Shanks, MD  Brief History    Helen [name] is a 88 y.o. female with medical history significant of anemia, history of breast cancer, lung scarring status post radiation, peripheral vascular disease, GI bleed, nonischemic cardiomyopathy, hyperlipidemia, hypertension, diabetes, stroke, CHF, carotid artery disease, anxiety, depression who presents with some confusion, hypoxia from facility.  History obtained with assistance of daughter. As above patient is reportedly been experiencing some confusion and possible hallucinations with hypoxia noted at her facility.  EDP spoke with staff at facility reported this.  Patient was seen in December for anemia and received transfusion and was discharged on home oxygen per report.  Has been on 2 L at night only prior to that admission.  Daughter states that she had been seemingly having trouble breathing for the past several days.  EMS was called by facility and due to her hypoxia and shortness of breath she was administered albuterol, Atrovent, 125 Solu-Medrol in route.  She denies fevers, chills, chest pain, abdominal pain, constipation, diarrhea, nausea, vomiting.    ED Course: Vital signs in the ED significant for blood pressure in the 767H to 419F systolic, requiring 2 L to maintain saturations.  Lab work-up showed CMP with sodium 129, chloride 82, bicarb 38, glucose 140, calcium 8.3 which improved considering albumin of 2.8, protein 6.2.  CBC showing hemoglobin stable at 9.5.  BNP elevated to 3071.  Troponin normal on first check and repeat pending.  Respiratory panel flu COVID-negative.  Patient was typed and screened.  Urinalysis pending.  Urine sodium, urine awesome, urine creatinine, serum awesome's are pending.  VBG showed pH normal at 7.35 but PCO2 elevated at 76.  Chest x-ray showed mild pulmonary edema superimposed on scarring.  CT head showed no acute  abnormality.  [facility] were consulted to admit the patient for further evaluation and care.  The patient is being visited by her daughter. All questions answered to the best of my ability.  Consultants    Procedures    Antibiotics   Anti-infectives (From admission, onward)    None       Subjective  The patient is resting comfortably. She has no complaints of vaginal or pelvic pain.  Objective   Vitals:  Vitals:   09/08/21 1334 09/08/21 1630  BP: (!) 84/62 113/68  Pulse:    Resp:    Temp:    SpO2:      Exam:  Constitutional:  The patient is awake, alert, and oriented x 3. No complaints Respiratory:  No increased work of breathing. No wheezes, rales, or rhonchi No tactile fremitus Cardiovascular:  Regular rate and rhythm No murmurs, ectopy, or gallups. No lateral PMI. No thrills. Abdomen:  Abdomen is soft, non-tender, non-distended No hernias, masses, or organomegaly Normoactive bowel sounds.  Musculoskeletal:  No cyanosis, clubbing, or edema Genitourinary: External genitalia (labia majora/minora) demonstrates mild erythema. No lesions. Introitus of vagina: No erythema. No lesions. Skin:  No rashes, lesions, ulcers palpation of skin: no induration or nodules Neurologic:  CN 2-12 intact Sensation all 4 extremities intact Psychiatric:  Mental status Mood, affect appropriate Orientation to person, place, time  judgment and insight appear intact   I have personally reviewed the following:   Today's Data  Vitals  Lab Data  BMP CBC  Micro Data    Imaging  CT head CXR  Cardiology Data  EKG Echocardiogram  Other Data    Scheduled Meds:  apixaban  5 mg Oral BID   carvedilol  12.5 mg Oral BID WC   conjugated estrogens  1 Applicatorful Vaginal QHS   furosemide  40 mg Intravenous BID   insulin aspart  0-9 Units Subcutaneous TID AC & HS   pantoprazole  40 mg Oral Daily   sacubitril-valsartan  1 tablet Oral BID   sodium  chloride flush  3 mL Intravenous Q12H   sodium chloride  1 g Oral TID WC    Principal Problem:   CHF exacerbation (HCC) Active Problems:   Diabetes mellitus without complication (HCC)   Peripheral vascular disease (HCC)   Acute encephalopathy   Essential hypertension   Anemia of chronic disease   Acute respiratory failure with hypoxia and hypercarbia (HCC)   Acute respiratory failure with hypoxia ([place])   LOS: 2 days   A & P  Acute cephalopathy: Clearing. Per patient's daughter the patient is a little more confused than she usually is at home, although she is closer to her baseline. Pt presented with confusion and disorientation from her facility. Pt is awake, alert, and oriented x 3.  Acute respiratory failure with hypoxia and hypercarbia: She is currently saturating at 95% on 2L by nasal canula. She is displaying pursed lip breathing. Will continue to try to wean to room air. I have explained to the patient's daughter that she may need to discharge on O2.  CHF exacerbation: BNP elevated to 3071 on admission.  Chest x-ray with mild pulmonary edema superimposed on scarring. > Findings consistent with CHF exacerbation. >Echocardiogram has demonstrated 45-50% with mildly decreased function. Diastolic function is indeterminate RV systolic function is preserved. Lt atrium is moderately dilated. There is moderate mitral valve regurgitation. There is likely mild to moderate aortic valve stenosis. The patient is being monitored on telemetry. She is receiving lasix 40 mg IV BID. Monitor volume status, creatinine, and electrolytes. Continue home carvedilol and Entresto.  COPD: Patient did smoke remotely. She is currently demonstrating pursed lip breathing. Some of the patient's hypoxic respiratory failure may be chronic. She may require O2 at discharge.  Contraction Alkalosis:  Will reduce lasix to daily. Would alternate with diamox, but patient has an allergy to sulfa medications. Elevated CO2 may  also be indicative of some level of COPD.   Hyponatremia: Due to SIADH. Na 130 today. Urine sodium is 108. The patient has been placed on a 1200 cc fluid restriction. NACl to replace sodium excreted due to lasix.   Hypertension Low BP this am at 0501 at 81/42. Now patient normotensive on Entresto and carvedilol. Monitor.   Diabetes -SSI  History of CVA Hyperlipidemia PVD Carotid artery disease - Not currently on medication for cholesterol - Continue home eliquis   I have seen and examined this patient myself. I have spent 38 minutes in her evaluation and care.  DVT prophylaxis:      Eliquis Code Status:              DNR Family Communication:       Daughter updated at bedside. All questions answered to the best of my ability. I haave also called the patient's daughter, Helen [name], who is the POA. Again all question answered to the best of my ability.  Disposition Plan:              Patient is from:                        [place]  Anticipated DC to:                   Same as above             Anticipated DC date:               1 to 4 days             Anticipated DC barriers:         None      Govani Radloff, DO [facility] Direct contact: see [email or web address]  7PM-7AM contact night coverage as above 09/08/2021, 5:12 PM  LOS: 1 day

## 2021-09-08 NOTE — Progress Notes (Signed)
RT NOTE:  Pt currently off BiPAP, no distress noted, pt states she's resting comfortably. BiPAP on stand by at bedside.

## 2021-09-08 NOTE — TOC Progression Note (Signed)
Transition of Care [facility]) - Progression Note    Patient Details  Name: Helen [name] MRN: [id number] Date of Birth: 03/07/1930  [facility] CM/SW Contact  Ross Ludwig, [place] Phone Number: 09/08/2021, 6:30 PM  Clinical Narrative:    Patient is from [facility] for short term rehab, will need updated therapy notes.        Expected Discharge Plan and Services                                                 Social Determinants of Health (SDOH) Interventions    Readmission Risk Interventions Readmission Risk Prevention Plan 08/22/2021 08/21/2021  Transportation Screening Complete Complete  PCP or Specialist Appt within 5-7 Days - Complete  PCP or Specialist Appt within 3-5 Days Complete -  Home Care Screening - Complete  Medication Review (RN CM) - Complete  HRI or Home Care Consult Complete -  Social Work Consult for Recovery Care Planning/Counseling Complete -  Palliative Care Screening Complete -  Medication Review [employer]) Complete -  Some recent data might be hidden

## 2021-09-09 LAB — GLUCOSE, CAPILLARY
Glucose-Capillary: 110 mg/dL — ABNORMAL HIGH (ref 70–99)
Glucose-Capillary: 117 mg/dL — ABNORMAL HIGH (ref 70–99)
Glucose-Capillary: 121 mg/dL — ABNORMAL HIGH (ref 70–99)
Glucose-Capillary: 127 mg/dL — ABNORMAL HIGH (ref 70–99)
Glucose-Capillary: 130 mg/dL — ABNORMAL HIGH (ref 70–99)
Glucose-Capillary: 141 mg/dL — ABNORMAL HIGH (ref 70–99)

## 2021-09-09 LAB — BASIC METABOLIC PANEL
Anion gap: 10 (ref 5–15)
BUN: 22 mg/dL (ref 8–23)
CO2: 36 mmol/L — ABNORMAL HIGH (ref 22–32)
Calcium: 8.4 mg/dL — ABNORMAL LOW (ref 8.9–10.3)
Chloride: 83 mmol/L — ABNORMAL LOW (ref 98–111)
Creatinine, Ser: 0.97 mg/dL (ref 0.44–1.00)
GFR, Estimated: 56 mL/min — ABNORMAL LOW (ref >60)
Glucose, Bld: 127 mg/dL — ABNORMAL HIGH (ref 70–99)
Potassium: 3.6 mmol/L (ref 3.5–5.1)
Sodium: 129 mmol/L — ABNORMAL LOW (ref 135–145)

## 2021-09-09 NOTE — Care Management Important Message (Signed)
Important Message  Patient Details IM Letter placed in Patients room. Name: Helen [name] MRN: [id number] Date of Birth: 06-Feb-1930   Medicare Important Message Given:  Yes     Kerin Salen 09/09/2021, 11:56 AM

## 2021-09-09 NOTE — Plan of Care (Signed)
°  Problem: Education: Goal: Ability to demonstrate management of disease process will improve Outcome: Progressing Goal: Ability to verbalize understanding of medication therapies will improve Outcome: Progressing Goal: Individualized Educational Video(s) Outcome: Progressing   Problem: Activity: Goal: Capacity to carry out activities will improve Outcome: Progressing   Problem: Cardiac: Goal: Ability to achieve and maintain adequate cardiopulmonary perfusion will improve Outcome: Progressing   Problem: Health Behavior/Discharge Planning: Goal: Ability to manage health-related needs will improve Outcome: Progressing   Problem: Clinical Measurements: Goal: Ability to maintain clinical measurements within normal limits will improve Outcome: Progressing Goal: Will remain free from infection Outcome: Progressing Goal: Diagnostic test results will improve Outcome: Progressing Goal: Respiratory complications will improve Outcome: Progressing Goal: Cardiovascular complication will be avoided Outcome: Progressing   Problem: Activity: Goal: Risk for activity intolerance will decrease Outcome: Progressing   Problem: Nutrition: Goal: Adequate nutrition will be maintained Outcome: Progressing   Problem: Coping: Goal: Level of anxiety will decrease Outcome: Progressing   Problem: Elimination: Goal: Will not experience complications related to bowel motility Outcome: Progressing Goal: Will not experience complications related to urinary retention Outcome: Progressing   Problem: Pain Managment: Goal: General experience of comfort will improve Outcome: Progressing   Problem: Safety: Goal: Ability to remain free from injury will improve Outcome: Progressing   Problem: Skin Integrity: Goal: Risk for impaired skin integrity will decrease Outcome: Progressing

## 2021-09-09 NOTE — Evaluation (Signed)
Physical Therapy Evaluation Patient Details Name: Helen [name] MRN: [id number] DOB: 1930/07/27 Today's Date: 09/09/2021  History of Present Illness  Patient is a 88 year old female  who presented to the hosptial from rehab with hypoxia and confusion. patient was found to have CHF exacerbation, and acute respiratory failure with hypoxia and hypercabnia, COPD. PMH: anemia, a fib, CHF, diabetes, PVD, breast cancer.  Clinical Impression  On eval, pt required Min A for bed mobility. She sat EOB for ~5 minutes. She c/o dizziness and fear of falling over the entire time. She eventually stated she could not sit up any longer and needed to lie back down. Assisted pt back into bed at her request. Will plan to follow and progress activity as tolerated. Recommend pt return to SNF at discharge.      Recommendations for follow up therapy are one component of a multi-disciplinary discharge planning process, led by the attending physician.  Recommendations may be updated based on patient status, additional functional criteria and insurance authorization.  Follow Up Recommendations Skilled nursing-short term rehab (<3 hours/day)    Assistance Recommended at Discharge Frequent or constant Supervision/Assistance  Patient can return home with the following  A lot of help with walking and/or transfers    Equipment Recommendations None recommended by PT  Recommendations for Other Services       Functional Status Assessment Patient has had a recent decline in their functional status and demonstrates the ability to make significant improvements in function in a reasonable and predictable amount of time.     Precautions / Restrictions Precautions Precautions: Fall Restrictions Weight Bearing Restrictions: No      Mobility  Bed Mobility Overal bed mobility: Needs Assistance Bed Mobility: Supine to Sit, Sit to Supine     Supine to sit: Min assist Sit to supine: Min assist   General bed mobility  comments: Assist for trunk and bil LEs. Increased time. Cues provided.    Transfers                   General transfer comment: NT-pt c/o dizziness-unable to attempt standing. Pt requested to lie back down    Ambulation/Gait                  Stairs            Wheelchair Mobility    Modified Rankin (Stroke Patients Only)       Balance Overall balance assessment: Needs assistance   Sitting balance-Leahy Scale: Fair Sitting balance - Comments: Pt c/o feeling dizzy and fearful of falling                                     Pertinent Vitals/Pain Pain Assessment Pain Assessment: Faces Faces Pain Scale: Hurts little more Pain Location: genital area Pain Descriptors / Indicators: Burning Pain Intervention(s): Repositioned, Limited activity within patient's tolerance    Home Living Family/patient expects to be discharged to:: [place]: Rollator (4 wheels);Shower seat;Grab bars - tub/shower;Grab bars - toilet;Hand held shower head      Prior Function Prior Level of Function : Needs assist;History of Falls (last six months)             Mobility Comments: requires walker for ambulation. pt was participating with PT in [facility] rehab prior  to this admission. ADLs Comments: at baseline, pt was independent with ADLs, ambulation     Hand Dominance   Dominant Hand: Right    Extremity/Trunk Assessment   Upper Extremity Assessment Upper Extremity Assessment: Defer to OT evaluation    Lower Extremity Assessment Lower Extremity Assessment: Generalized weakness       Communication   Communication: HOH  Cognition Arousal/Alertness: Awake/alert Behavior During Therapy: WFL for tasks assessed/performed Overall Cognitive Status: No family/caregiver present to determine baseline cognitive functioning Area of Impairment: Memory, Safety/judgement                     Memory:  Decreased short-term memory   Safety/Judgement: Decreased awareness of safety              General Comments      Exercises     Assessment/Plan    PT Assessment Patient needs continued PT services  PT Problem List Decreased strength;Decreased mobility;Decreased activity tolerance;Decreased balance;Decreased knowledge of use of DME;Pain       PT Treatment Interventions DME instruction;Gait training;Therapeutic activities;Therapeutic exercise;Patient/family education;Balance training;Functional mobility training    PT Goals (Current goals can be found in the Care Plan section)  Acute Rehab PT Goals Patient Stated Goal: to get stronger PT Goal Formulation: With patient Time For Goal Achievement: 09/23/21 Potential to Achieve Goals: Good    Frequency Min 3X/week     Co-evaluation               AM-PAC PT "6 Clicks" Mobility  Outcome Measure Help needed turning from your back to your side while in a flat bed without using bedrails?: A Little Help needed moving from lying on your back to sitting on the side of a flat bed without using bedrails?: A Little Help needed moving to and from a bed to a chair (including a wheelchair)?: A Lot Help needed standing up from a chair using your arms (e.g., wheelchair or bedside chair)?: A Lot Help needed to walk in hospital room?: A Lot Help needed climbing 3-5 steps with a railing? : Total 6 Click Score: 13    End of Session   Activity Tolerance: Patient tolerated treatment well;Patient limited by fatigue Patient left: in bed;with call bell/phone within reach;with bed alarm set   PT Visit Diagnosis: Muscle weakness (generalized) (M62.81);Difficulty in walking, not elsewhere classified (R26.2)    Time: 0177-9390 PT Time Calculation (min) (ACUTE ONLY): 11 min   Charges:   PT Evaluation $PT Eval Moderate Complexity: 1 Mod             Doreatha Massed, PT Acute Rehabilitation  Office: [phone] Pager: [phone]

## 2021-09-09 NOTE — Evaluation (Signed)
Occupational Therapy Evaluation Patient Details Name: Helen [name] MRN: [id number] DOB: [birth year] Today's Date: 09/09/2021   History of Present Illness Patient is a 88 year old female  who presented to the hosptial from rehab with hypoxia and confusion. patient was found to have CHF exacerbation, and acute respiratory failure with hypoxia and hypercabnia, COPD. PMH: anemia, a fib, CHF, diabetes, PVD, breast cancer.   Clinical Impression   Patient is a 88 year old female who presented from SNF where she was receiving therapy as noted above. Patient was previously living in [place] prior to recent SNF stay.  Currently, patients participation in session was limited by HR reaching 145 bpm max sitting on edge of bed. Patient was returned to supine. Patient was able to participate in grooming ADLs sitting in bed. Patient is noted to have decreased short term memory, decreased activity tolerance, decreased endurance, decreased sitting balance and decreased cardiopulmomary tolerance impacting participation in ADLs. Patient would continue to benefit from skilled OT services at this time while admitted and after d/c to address noted deficits in order to improve overall safety and independence in ADLs.       Recommendations for follow up therapy are one component of a multi-disciplinary discharge planning process, led by the attending physician.  Recommendations may be updated based on patient status, additional functional criteria and insurance authorization.   Follow Up Recommendations  Skilled nursing-short term rehab (<3 hours/day)    Assistance Recommended at Discharge Frequent or constant Supervision/Assistance  Patient can return home with the following A lot of help with bathing/dressing/bathroom;A little help with walking and/or transfers;Assistance with cooking/housework;Help with stairs or ramp for entrance;Assist for transportation;Direct supervision/assist for financial management     Functional Status Assessment  Patient has had a recent decline in their functional status and demonstrates the ability to make significant improvements in function in a reasonable and predictable amount of time.  Equipment Recommendations  BSC/3in1    Recommendations for Other Services       Precautions / Restrictions Precautions Precautions: Fall Restrictions Weight Bearing Restrictions: No      Mobility Bed Mobility Overal bed mobility: Needs Assistance Bed Mobility: Supine to Sit     Supine to sit: Min assist     General bed mobility comments: with education for proper hand and foot placement with increased time to participate. noted to have pain impacting movements as well.    Transfers                          Balance                                           ADL either performed or assessed with clinical judgement   ADL Overall ADL's : Needs assistance/impaired Eating/Feeding: Sitting;Set up Eating/Feeding Details (indicate cue type and reason): liquids at this time Grooming: Oral care;Wash/dry face Grooming Details (indicate cue type and reason): sitting at bed level. Upper Body Bathing: Minimal assistance;Bed level   Lower Body Bathing: Bed level;Maximal assistance   Upper Body Dressing : Bed level;Moderate assistance   Lower Body Dressing: Bed level;Maximal assistance     Toilet Transfer Details (indicate cue type and reason): unable to complete with HR rising to 145 bpm sitting on edge of bed with patient return to supine. Toileting- Clothing Manipulation and Hygiene: Maximal assistance;Bed level  Functional mobility during ADLs: +2 for safety/equipment;+2 for physical assistance       Vision Baseline Vision/History: 1 Wears glasses Patient Visual Report: No change from baseline       Perception     Praxis      Pertinent Vitals/Pain Pain Assessment Pain Assessment: Faces Faces Pain Scale: Hurts little  more Pain Location: supra pubic low abdomen Pain Descriptors / Indicators: Burning Pain Intervention(s): Patient requesting pain meds-RN notified, RN gave pain meds during session     Hand Dominance Right   Extremity/Trunk Assessment Upper Extremity Assessment Upper Extremity Assessment: RUE deficits/detail;Generalized weakness RUE Deficits / Details: long-standing shoulder limitations RUE Coordination: decreased gross motor   Lower Extremity Assessment Lower Extremity Assessment: Defer to PT evaluation   Cervical / Trunk Assessment Cervical / Trunk Assessment: Normal   Communication Communication Communication: HOH   Cognition Arousal/Alertness: Awake/alert Behavior During Therapy: WFL for tasks assessed/performed Overall Cognitive Status: No family/caregiver present to determine baseline cognitive functioning                                 General Comments: patient was noted to repeat questions x3 during session with no carryover. on last repeat of question patient asked if she had asked before.     General Comments       Exercises     Shoulder Instructions      Home Living Family/patient expects to be discharged to:: Skilled nursing facility Living Arrangements: Alone Available Help at Discharge: Family;Available PRN/intermittently Type of Home: Independent living facility Home Access: Level entry     Home Layout: One level     Bathroom Shower/Tub: Occupational psychologist: Standard     Home Equipment: Rollator (4 wheels);Shower seat;Grab bars - tub/shower;Grab bars - toilet;Hand held shower head   Additional Comments: patient was in ILF prior level with help with housekeeping. currently at SNF for rehab      Prior Functioning/Environment Prior Level of Function : Independent/Modified Independent             Mobility Comments: walked with rollator ADLs Comments: independent with bathing & dressing, doesn't drive, walked to  dining room to retrieve meals 3x per day        OT Problem List: Decreased activity tolerance;Impaired balance (sitting and/or standing);Decreased strength;Decreased cognition;Decreased safety awareness;Decreased knowledge of use of DME or AE      OT Treatment/Interventions: Self-care/ADL training;DME and/or AE instruction;Therapeutic activities;Patient/family education;Balance training    OT Goals(Current goals can be found in the care plan section) Acute Rehab OT Goals Patient Stated Goal: to get back to ILF OT Goal Formulation: With patient Time For Goal Achievement: 09/23/21 Potential to Achieve Goals: Good  OT Frequency: Min 2X/week    Co-evaluation              AM-PAC OT "6 Clicks" Daily Activity     Outcome Measure Help from another person eating meals?: None Help from another person taking care of personal grooming?: A Lot Help from another person toileting, which includes using toliet, bedpan, or urinal?: A Lot Help from another person bathing (including washing, rinsing, drying)?: A Lot Help from another person to put on and taking off regular upper body clothing?: A Lot Help from another person to put on and taking off regular lower body clothing?: A Lot 6 Click Score: 14   End of Session Equipment Utilized During Treatment: Oxygen Nurse Communication: Other (  comment) (HR with movement)  Activity Tolerance: Patient limited by fatigue Patient left: with nursing/sitter in room;with call bell/phone within reach;in bed;with bed alarm set  OT Visit Diagnosis: Unsteadiness on feet (R26.81);Other abnormalities of gait and mobility (R26.89);Muscle weakness (generalized) (M62.81);Other symptoms and signs involving cognitive function                Time: 0041-5930 OT Time Calculation (min): 24 min Charges:  OT General Charges $OT Visit: 1 Visit OT Evaluation $OT Eval Low Complexity: 1 Low OT Treatments $Self Care/Home Management : 8-22 mins  Jackelyn Poling OTR/L,  MS Acute Rehabilitation Department Office# [phone] Pager# [phone]   Marcellina Millin 09/09/2021, 12:58 PM

## 2021-09-09 NOTE — Plan of Care (Signed)
Problem: Activity: Goal: Capacity to carry out activities will improve Outcome: Progressing   Problem: Health Behavior/Discharge Planning: Goal: Ability to manage health-related needs will improve Outcome: Progressing   Problem: Education: Goal: Knowledge of General Education information will improve Description: Including pain rating scale, medication(s)/side effects and non-pharmacologic comfort measures Outcome: Completed/Met

## 2021-09-09 NOTE — Progress Notes (Signed)
PROGRESS NOTE  Sabreena Vogan KXF:[id number] DOB: 10/06/1929 DOA: 09/05/2021 PCP: Vernie Shanks, MD  Brief History    Helen [name] is a 88 y.o. female with medical history significant of anemia, history of breast cancer, lung scarring status post radiation, peripheral vascular disease, GI bleed, nonischemic cardiomyopathy, hyperlipidemia, hypertension, diabetes, stroke, CHF, carotid artery disease, anxiety, depression who presents with some confusion, hypoxia from facility.  History obtained with assistance of daughter. As above patient is reportedly been experiencing some confusion and possible hallucinations with hypoxia noted at her facility.  EDP spoke with staff at facility reported this.  Patient was seen in December for anemia and received transfusion and was discharged on home oxygen per report.  Has been on 2 L at night only prior to that admission.  Daughter states that she had been seemingly having trouble breathing for the past several days.  EMS was called by facility and due to her hypoxia and shortness of breath she was administered albuterol, Atrovent, 125 Solu-Medrol in route.  She denies fevers, chills, chest pain, abdominal pain, constipation, diarrhea, nausea, vomiting.    ED Course: Vital signs in the ED significant for blood pressure in the 696V to 893Y systolic, requiring 2 L to maintain saturations.  Lab work-up showed CMP with sodium 129, chloride 82, bicarb 38, glucose 140, calcium 8.3 which improved considering albumin of 2.8, protein 6.2.  CBC showing hemoglobin stable at 9.5.  BNP elevated to 3071.  Troponin normal on first check and repeat pending.  Respiratory panel flu COVID-negative.  Patient was typed and screened.  Urinalysis pending.  Urine sodium, urine awesome, urine creatinine, serum awesome's are pending.  VBG showed pH normal at 7.35 but PCO2 elevated at 76.  Chest x-ray showed mild pulmonary edema superimposed on scarring.  CT head showed no acute  abnormality.  [facility] were consulted to admit the patient for further evaluation and care.  The patient is being visited by her daughter. All questions answered to the best of my ability.  Consultants    Procedures    Antibiotics   Anti-infectives (From admission, onward)    None       Subjective  The patient is resting comfortably. She has no new complaints.   Objective   Vitals:  Vitals:   09/09/21 0404 09/09/21 1325  BP: 102/75 (!) 103/46  Pulse: 87 69  Resp: 18 18  Temp: 97.7 F (36.5 C) 97.8 F (36.6 C)  SpO2: 95% (!) 85%    Exam:  Constitutional:  The patient is awake, alert, and oriented x 3. No complaints Respiratory:  No increased work of breathing. No wheezes, rales, or rhonchi No tactile fremitus Cardiovascular:  Regular rate and rhythm No murmurs, ectopy, or gallups. No lateral PMI. No thrills. Abdomen:  Abdomen is soft, non-tender, non-distended No hernias, masses, or organomegaly Normoactive bowel sounds.  Musculoskeletal:  No cyanosis, clubbing, or edema Genitourinary: External genitalia (labia majora/minora) demonstrates mild erythema. No lesions. Introitus of vagina: No erythema. No lesions. Skin:  No rashes, lesions, ulcers palpation of skin: no induration or nodules Neurologic:  CN 2-12 intact Sensation all 4 extremities intact Psychiatric:  Mental status Mood, affect appropriate Orientation to person, place, time  judgment and insight appear intact   I have personally reviewed the following:   Today's Data  Vitals  Lab Data  BMP CBC  Micro Data    Imaging  CT head CXR  Cardiology Data  EKG Echocardiogram  Other Data  Scheduled Meds:  apixaban  5 mg Oral BID   carvedilol  12.5 mg Oral BID WC   conjugated estrogens  1 Applicatorful Vaginal QHS   furosemide  40 mg Intravenous Daily   insulin aspart  0-9 Units Subcutaneous TID AC & HS   pantoprazole  40 mg Oral Daily   sacubitril-valsartan   1 tablet Oral BID   sodium chloride flush  3 mL Intravenous Q12H   sodium chloride  1 g Oral TID WC    Principal Problem:   CHF exacerbation (HCC) Active Problems:   Diabetes mellitus without complication (HCC)   Peripheral vascular disease (HCC)   Acute encephalopathy   Essential hypertension   Anemia of chronic disease   Acute respiratory failure with hypoxia and hypercarbia (HCC)   Acute respiratory failure with hypoxia ([place])   LOS: 3 days   A & P  Acute cephalopathy: Clearing. Per patient's daughter the patient is a little more confused than she usually is at home, although she is closer to her baseline. Pt presented with confusion and disorientation from her facility. Pt is awake, alert, and oriented x 3.  Acute respiratory failure with hypoxia and hypercarbia: She is currently saturating at 95% on 2L by nasal canula. She is displaying pursed lip breathing. Will continue to try to wean to room air. I have explained to the patient's daughter that she may need to discharge on O2.  CHF exacerbation: BNP elevated to 3071 on admission.  Chest x-ray with mild pulmonary edema superimposed on scarring. > Findings consistent with CHF exacerbation. >Echocardiogram has demonstrated 45-50% with mildly decreased function. Diastolic function is indeterminate RV systolic function is preserved. Lt atrium is moderately dilated. There is moderate mitral valve regurgitation. There is likely mild to moderate aortic valve stenosis. The patient is being monitored on telemetry. She is receiving lasix 40 mg IV BID. Monitor volume status, creatinine, and electrolytes. Continue home carvedilol and Entresto.  COPD: Patient did smoke remotely. She is currently demonstrating pursed lip breathing. Some of the patient's hypoxic respiratory failure may be chronic. She may require O2 at discharge.  Contraction Alkalosis:  Will reduce lasix to daily. Would alternate with diamox, but patient has an allergy to sulfa  medications. Elevated CO2 may also be indicative of some level of COPD.   Hyponatremia: Due to SIADH. Na 129 today. Urine sodium is 108. The patient has been placed on a 1200 cc fluid restriction. NACl to replace sodium excreted due to lasix.   Hypertension Low BP this am at 0501 at 81/42. Now patient normotensive on Entresto and carvedilol. Monitor.   Diabetes -SSI  History of CVA Hyperlipidemia PVD Carotid artery disease - Not currently on medication for cholesterol - Continue home eliquis   I have seen and examined this patient myself. I have spent 32 minutes in her evaluation and care.  DVT prophylaxis:      Eliquis Code Status:              DNR Family Communication:       Daughter updated at bedside.   Disposition Plan:              Patient is from:                        [facility]             Anticipated DC to:  Same as above             Anticipated DC date:               1 to 4 days             Anticipated DC barriers:         None      Joni Colegrove, DO [facility] Direct contact: see [email or web address]  7PM-7AM contact night coverage as above 09/09/2021, 5:39 PM  LOS: 1 day

## 2021-09-10 LAB — BASIC METABOLIC PANEL
Anion gap: 9 (ref 5–15)
BUN: 27 mg/dL — ABNORMAL HIGH (ref 8–23)
CO2: 35 mmol/L — ABNORMAL HIGH (ref 22–32)
Calcium: 8.3 mg/dL — ABNORMAL LOW (ref 8.9–10.3)
Chloride: 84 mmol/L — ABNORMAL LOW (ref 98–111)
Creatinine, Ser: 1.25 mg/dL — ABNORMAL HIGH (ref 0.44–1.00)
GFR, Estimated: 41 mL/min — ABNORMAL LOW (ref >60)
Glucose, Bld: 112 mg/dL — ABNORMAL HIGH (ref 70–99)
Potassium: 3.4 mmol/L — ABNORMAL LOW (ref 3.5–5.1)
Sodium: 128 mmol/L — ABNORMAL LOW (ref 135–145)

## 2021-09-10 LAB — GLUCOSE, CAPILLARY
Glucose-Capillary: 117 mg/dL — ABNORMAL HIGH (ref 70–99)
Glucose-Capillary: 122 mg/dL — ABNORMAL HIGH (ref 70–99)
Glucose-Capillary: 104 mg/dL — ABNORMAL HIGH (ref 70–99)
Glucose-Capillary: 107 mg/dL — ABNORMAL HIGH (ref 70–99)

## 2021-09-10 MED ORDER — FUROSEMIDE 40 MG PO TABS
40.0000 mg | ORAL_TABLET | Freq: Every day | ORAL | Status: DC
  Administered 2021-09-11 – 2021-09-12 (×2): 40 mg via ORAL
  Filled 2021-09-10 (×2): qty 1

## 2021-09-10 MED ORDER — SUCRALFATE 1 G PO TABS
1.0000 g | ORAL_TABLET | Freq: Two times a day (BID) | ORAL | Status: DC
  Administered 2021-09-10 – 2021-09-15 (×11): 1 g via ORAL
  Filled 2021-09-10 (×11): qty 1

## 2021-09-10 MED ORDER — ACETAZOLAMIDE ER 500 MG PO CP12
500.0000 mg | ORAL_CAPSULE | Freq: Two times a day (BID) | ORAL | Status: DC
  Administered 2021-09-10 – 2021-09-12 (×5): 500 mg via ORAL
  Filled 2021-09-10 (×5): qty 1

## 2021-09-10 NOTE — Progress Notes (Addendum)
PROGRESS NOTE   Helen [name]  EHO:[id number] DOB: [birth year] DOA: 09/05/2021 PCP: Vernie Shanks, MD  Brief Narrative:   88 year old white female Known A. fib/Eliquis Combined systolic diastolic HF, HTN, HLD, DM TY 2 Prior stroke Breast cancer  Recent admission 12/28 through 08/26/2021 hemoglobin 5.1-eventually transfused 4 units PRBC--EGD 08/22/2021 normal esophagus, gastric polyps Started on sucralfate and PPI--at that time was discharged on home oxygen  Admit 3/70/4888 metabolic encephalopathy?  Hypoxic Likely secondary to CHF exacerbation in combination with COPD exacerbation  Hospital-Problem based course  Toxic metabolic encephalopathy on admission Likely secondary to possible hypercarbia versus underlying dementia She seems coherent this morning Acute hypoxic hypercarbic respiratory failure possibly acute CHF exacerbation Echo shows EF 45 to 50% down from prior echo--- she has a history of cardiomyopathy in 2018 follows with Dr. Oval Linsey Lasix has been adjusted to 40 mg IV daily and I will change it to p.o. for tomorrow Continue Coreg 12.5 twice daily and Entresto twice daily Strict I's/O-she is net neutral for admission Her weight last hospital stay was 64 kg  start Diamox twice daily Paroxysmal A. fib CHADS2 score >4 on Eliquis Continue anticoagulation Continue Coreg 12.5 twice daily for rate control Hypovolemic hyponatremia Sodium is 128 today, hesitate to restrict fluids-administer fluids with solute and Continue Lasix as above If no better we will get urine studies in the morning Recent GI bleed transfused 4 units-gastric polyps on exam No further current bleeding continue Protonix 40 daily-resume Carafate but discharged on tablet as it is more affordable  DVT prophylaxis: Eliquis Code Status: DNR Family Communication: Discussed with daughter [name] at bedside [phone] Disposition:  Status is: Inpatient  Remains inpatient appropriate because: Electrolyte  abnormalities  Consultants:    Procedures:   Antimicrobials:     Subjective: Doing well this morning feels like she is 7/10 in terms of overall condition and breathing better Does not feel swollen No chest pain No cough no cold No fever  Objective: Vitals:   09/09/21 1405 09/09/21 2037 09/10/21 0500 09/10/21 0621  BP:  111/64  (!) 155/82  Pulse:  68  74  Resp:  18  18  Temp:  97.9 F (36.6 C)  97.7 F (36.5 C)  TempSrc:  Oral  Oral  SpO2: 94% 99%  99%  Weight:   62.5 kg   Height:        Intake/Output Summary (Last 24 hours) at 09/10/2021 1423 Last data filed at 09/09/2021 2110 Gross per 24 hour  Intake 363 ml  Output --  Net 363 ml   Filed Weights   09/07/21 0500 09/09/21 0500 09/10/21 0500  Weight: 62.6 kg 62.4 kg 62.5 kg    Examination:  EOMI NCAT No icterus no pallor Cannot appreciate JVD--no lower extremity edema Abdomen is soft and slightly distended Chest is clear without added sound no rales no rhonchi Neurologically is intact without focal deficit    Data Reviewed: personally reviewed   CBC    Component Value Date/Time   WBC 9.1 09/08/2021 0437   RBC 3.58 (L) 09/08/2021 0437   HGB 9.9 (L) 09/08/2021 0437   HGB 11.4 03/17/2018 1418   HGB 11.7 05/08/2016 1244   HCT 30.8 (L) 09/08/2021 0437   HCT 35.6 03/17/2018 1418   HCT 37.1 05/08/2016 1244   PLT 152 09/08/2021 0437   PLT 249 03/17/2018 1418   MCV 86.0 09/08/2021 0437   MCV 89 03/17/2018 1418   MCV 84.5 05/08/2016 1244   MCH 27.7 09/08/2021 0437  MCHC 32.1 09/08/2021 0437   RDW 17.9 (H) 09/08/2021 0437   RDW 14.9 03/17/2018 1418   RDW 16.5 (H) 05/08/2016 1244   LYMPHSABS 1.1 09/08/2021 0437   LYMPHSABS 1.7 03/17/2018 1418   LYMPHSABS 1.1 05/08/2016 1244   MONOABS 0.9 09/08/2021 0437   MONOABS 0.5 05/08/2016 1244   EOSABS 0.1 09/08/2021 0437   EOSABS 0.3 03/17/2018 1418   BASOSABS 0.0 09/08/2021 0437   BASOSABS 0.0 03/17/2018 1418   BASOSABS 0.0 05/08/2016 1244   CMP  Latest Ref Rng & Units 09/10/2021 09/09/2021 09/08/2021  Glucose 70 - 99 mg/dL 112(H) 127(H) 114(H)  BUN 8 - 23 mg/dL 27(H) 22 23  Creatinine 0.44 - 1.00 mg/dL 1.25(H) 0.97 1.08(H)  Sodium 135 - 145 mmol/L 128(L) 129(L) 130(L)  Potassium 3.5 - 5.1 mmol/L 3.4(L) 3.6 4.2  Chloride 98 - 111 mmol/L 84(L) 83(L) 81(L)  CO2 22 - 32 mmol/L 35(H) 36(H) 39(H)  Calcium 8.9 - 10.3 mg/dL 8.3(L) 8.4(L) 8.6(L)  Total Protein 6.5 - 8.1 g/dL - - -  Total Bilirubin 0.3 - 1.2 mg/dL - - -  Alkaline Phos 38 - 126 U/L - - -  AST 15 - 41 U/L - - -  ALT 0 - 44 U/L - - -     Radiology Studies: No results found.   Scheduled Meds:  apixaban  5 mg Oral BID   carvedilol  12.5 mg Oral BID WC   conjugated estrogens  1 Applicatorful Vaginal QHS   furosemide  40 mg Intravenous Daily   insulin aspart  0-9 Units Subcutaneous TID AC & HS   pantoprazole  40 mg Oral Daily   sacubitril-valsartan  1 tablet Oral BID   sodium chloride flush  3 mL Intravenous Q12H   sodium chloride  1 g Oral TID WC   Continuous Infusions:   LOS: 4 days   Time spent: [place], MD [facility] To contact the attending provider between 7A-7P or the covering provider during after hours 7P-7A, please log into the web site [email or web address] and access using universal [place] password for that web site. If you do not have the password, please call the hospital operator.  09/10/2021, 2:23 PM         Sodium 128 chloride 84 Bicarb 35 BUNs/creatinine 22/0.9-->27/1.2

## 2021-09-10 NOTE — Progress Notes (Signed)
Occupational Therapy Treatment Patient Details Name: Helen [name] MRN: [id number] DOB: 26-Dec-1929 Today's Date: 09/10/2021   History of present illness Patient is a 88 year old female  who presented to the hosptial from rehab with hypoxia and confusion. patient was found to have CHF exacerbation, and acute respiratory failure with hypoxia and hypercabnia, COPD. PMH: anemia, a fib, CHF, diabetes, PVD, breast cancer.   OT comments  Patient was noted to have HR range from 100 bpm to 133 bpm with activity. Patient was able to participate in brief stand with RW with increased time and take two small steps to head of bed. Patient would continue to benefit from skilled OT services at this time while admitted and after d/c to address noted deficits in order to improve overall safety and independence in ADLs.     Recommendations for follow up therapy are one component of a multi-disciplinary discharge planning process, led by the attending physician.  Recommendations may be updated based on patient status, additional functional criteria and insurance authorization.    Follow Up Recommendations  Skilled nursing-short term rehab (<3 hours/day)    Assistance Recommended at Discharge Frequent or constant Supervision/Assistance  Patient can return home with the following  A lot of help with bathing/dressing/bathroom;A little help with walking and/or transfers;Assistance with cooking/housework;Help with stairs or ramp for entrance;Assist for transportation;Direct supervision/assist for financial management   Equipment Recommendations  BSC/3in1    Recommendations for Other Services      Precautions / Restrictions Precautions Precautions: Fall Precaution Comments: monitor HR Restrictions Weight Bearing Restrictions: No       Mobility Bed Mobility Overal bed mobility: Needs Assistance Bed Mobility: Supine to Sit, Sit to Supine     Supine to sit: Min assist Sit to supine: Min assist    General bed mobility comments: Assist for trunk and bil LEs. Increased time. Cues provided.    Transfers                         Balance                                           ADL either performed or assessed with clinical judgement   ADL Overall ADL's : Needs assistance/impaired     Grooming: Wash/dry face;Wash/dry hands;Sitting Grooming Details (indicate cue type and reason): EOB Upper Body Bathing: Sitting;Set up Upper Body Bathing Details (indicate cue type and reason): EOB with increased time     Upper Body Dressing : Minimal assistance;Sitting Upper Body Dressing Details (indicate cue type and reason): EOB       Toilet Transfer Details (indicate cue type and reason): patient was able to stand up x1 for abotu 45 seconds with BUE support on walker with min A with increased cues to lean forwards. HR noted to reach 133bpm                Extremity/Trunk Assessment              Vision       Perception     Praxis      Cognition Arousal/Alertness: Awake/alert Behavior During Therapy: WFL for tasks assessed/performed Overall Cognitive Status: No family/caregiver present to determine baseline cognitive functioning  Exercises      Shoulder Instructions       General Comments      Pertinent Vitals/ Pain       Pain Assessment Pain Assessment: No/denies pain  Home Living                                          Prior Functioning/Environment              Frequency  Min 2X/week        Progress Toward Goals  OT Goals(current goals can now be found in the care plan section)  Progress towards OT goals: Progressing toward goals     Plan Discharge plan remains appropriate    Co-evaluation                 AM-PAC OT "6 Clicks" Daily Activity     Outcome Measure   Help from another person eating meals?: None Help from  another person taking care of personal grooming?: A Lot Help from another person toileting, which includes using toliet, bedpan, or urinal?: A Lot Help from another person bathing (including washing, rinsing, drying)?: A Lot Help from another person to put on and taking off regular upper body clothing?: A Lot Help from another person to put on and taking off regular lower body clothing?: A Lot 6 Click Score: 14    End of Session Equipment Utilized During Treatment: Oxygen  OT Visit Diagnosis: Unsteadiness on feet (R26.81);Other abnormalities of gait and mobility (R26.89);Muscle weakness (generalized) (M62.81);Other symptoms and signs involving cognitive function   Activity Tolerance Patient limited by fatigue   Patient Left with call bell/phone within reach;in bed;with bed alarm set   Nurse Communication Mobility status        Time: 0109-3235 OT Time Calculation (min): 20 min  Charges: OT General Charges $OT Visit: 1 Visit OT Treatments $Self Care/Home Management : 8-22 mins  Jackelyn Poling OTR/L, MS Acute Rehabilitation Department Office# [phone] Pager# [phone]   Marcellina Millin 09/10/2021, 12:32 PM

## 2021-09-11 LAB — BASIC METABOLIC PANEL
Anion gap: 10 (ref 5–15)
BUN: 27 mg/dL — ABNORMAL HIGH (ref 8–23)
CO2: 34 mmol/L — ABNORMAL HIGH (ref 22–32)
Calcium: 8.2 mg/dL — ABNORMAL LOW (ref 8.9–10.3)
Chloride: 88 mmol/L — ABNORMAL LOW (ref 98–111)
Creatinine, Ser: 1.04 mg/dL — ABNORMAL HIGH (ref 0.44–1.00)
GFR, Estimated: 51 mL/min — ABNORMAL LOW (ref >60)
Glucose, Bld: 100 mg/dL — ABNORMAL HIGH (ref 70–99)
Potassium: 2.8 mmol/L — ABNORMAL LOW (ref 3.5–5.1)
Sodium: 132 mmol/L — ABNORMAL LOW (ref 135–145)

## 2021-09-11 LAB — CBC WITH DIFFERENTIAL/PLATELET
Abs Immature Granulocytes: 0.08 10*3/uL — ABNORMAL HIGH (ref 0.00–0.07)
Basophils Absolute: 0 10*3/uL (ref 0.0–0.1)
Basophils Relative: 0 %
Eosinophils Absolute: 0.3 10*3/uL (ref 0.0–0.5)
Eosinophils Relative: 3 %
HCT: 27.1 % — ABNORMAL LOW (ref 36.0–46.0)
Hemoglobin: 8.6 g/dL — ABNORMAL LOW (ref 12.0–15.0)
Immature Granulocytes: 1 %
Lymphocytes Relative: 11 %
Lymphs Abs: 1.1 10*3/uL (ref 0.7–4.0)
MCH: 27.9 pg (ref 26.0–34.0)
MCHC: 31.7 g/dL (ref 30.0–36.0)
MCV: 88 fL (ref 80.0–100.0)
Monocytes Absolute: 0.8 10*3/uL (ref 0.1–1.0)
Monocytes Relative: 8 %
Neutro Abs: 7.6 10*3/uL (ref 1.7–7.7)
Neutrophils Relative %: 77 %
Platelets: 122 10*3/uL — ABNORMAL LOW (ref 150–400)
RBC: 3.08 MIL/uL — ABNORMAL LOW (ref 3.87–5.11)
RDW: 18.6 % — ABNORMAL HIGH (ref 11.5–15.5)
WBC: 9.8 10*3/uL (ref 4.0–10.5)
nRBC: 0 % (ref 0.0–0.2)

## 2021-09-11 LAB — GLUCOSE, CAPILLARY
Glucose-Capillary: 96 mg/dL (ref 70–99)
Glucose-Capillary: 106 mg/dL — ABNORMAL HIGH (ref 70–99)
Glucose-Capillary: 93 mg/dL (ref 70–99)
Glucose-Capillary: 137 mg/dL — ABNORMAL HIGH (ref 70–99)

## 2021-09-11 LAB — MAGNESIUM: Magnesium: 1.9 mg/dL (ref 1.7–2.4)

## 2021-09-11 MED ORDER — APIXABAN 2.5 MG PO TABS
2.5000 mg | ORAL_TABLET | Freq: Two times a day (BID) | ORAL | Status: DC
  Administered 2021-09-11 – 2021-09-15 (×8): 2.5 mg via ORAL
  Filled 2021-09-11 (×8): qty 1

## 2021-09-11 MED ORDER — CARVEDILOL 6.25 MG PO TABS
6.2500 mg | ORAL_TABLET | Freq: Two times a day (BID) | ORAL | Status: DC
  Administered 2021-09-12 – 2021-09-14 (×6): 6.25 mg via ORAL
  Filled 2021-09-11 (×7): qty 1

## 2021-09-11 MED ORDER — POTASSIUM CHLORIDE CRYS ER 20 MEQ PO TBCR
40.0000 meq | EXTENDED_RELEASE_TABLET | Freq: Two times a day (BID) | ORAL | Status: DC
  Administered 2021-09-11 – 2021-09-12 (×3): 40 meq via ORAL
  Filled 2021-09-11 (×3): qty 2

## 2021-09-11 NOTE — Progress Notes (Signed)
°   09/11/21 1331  Assess: MEWS Score  Temp 98 F (36.7 C)  BP (!) 99/51  Pulse Rate (!) 102  ECG Heart Rate (!) 102  Resp 20  Level of Consciousness Alert  SpO2 100 %  O2 Device Nasal Cannula  O2 Flow Rate (L/min) 1 L/min  Assess: MEWS Score  MEWS Temp 0  MEWS Systolic 1  MEWS Pulse 1  MEWS RR 0  MEWS LOC 0  MEWS Score 2  MEWS Score Color Yellow  Assess: if the MEWS score is Yellow or Red  Were vital signs taken at a resting state? Yes  Focused Assessment No change from prior assessment  Does the patient meet 2 or more of the SIRS criteria? No  MEWS guidelines implemented *See Row Information* Yes  Treat  MEWS Interventions Escalated (See documentation below)  Pain Scale 0-10  Pain Score 0  Take Vital Signs  Increase Vital Sign Frequency  Yellow: Q 2hr X 2 then Q 4hr X 2, if remains yellow, continue Q 4hrs  Escalate  MEWS: Escalate Yellow: discuss with charge nurse/RN and consider discussing with provider and RRT  Notify: Charge Nurse/RN  Name of Charge Nurse/RN Notified Myriam Jacobson  Date Charge Nurse/RN Notified 09/11/21  Time Charge Nurse/RN Notified 1359  Notify: Provider  Provider Name/Title Sam[place]  Date Provider Notified 09/11/21  Time Provider Notified 1354  Notification Type Page  Notification Reason Other (Comment) (hypotension, decreased urine output)  Provider response See new orders  Date of Provider Response 09/11/21  Time of Provider Response 1355  Document  Patient Outcome Stabilized after interventions  Progress note created (see row info) Yes  Assess: SIRS CRITERIA  SIRS Temperature  0  SIRS Pulse 1  SIRS Respirations  0  SIRS WBC 0  SIRS Score Sum  1

## 2021-09-11 NOTE — Progress Notes (Signed)
Chaplain tried to engage in an initial visit but Shirly was asleep.  Chaplain will follow-up for request for a Idelle Crouch.  Chaplain must first find out if [place] has a parish.  Chaplain will follow-up.    09/11/21 1400  Clinical Encounter Type  Visited With Patient not available  Visit Type Initial

## 2021-09-11 NOTE — Progress Notes (Signed)
Physical Therapy Treatment Patient Details Name: Helen [name] MRN: [id number] DOB: 1929/10/06 Today's Date: 09/11/2021   History of Present Illness Patient is a 88 year old female  who presented to the hosptial from rehab with hypoxia and confusion. patient was found to have CHF exacerbation, and acute respiratory failure with hypoxia and hypercabnia, COPD. PMH: anemia, a fib, CHF, diabetes, PVD, breast cancer.    PT Comments    AxO x 1 only.  Pleasant but anxious.  Pt resides at [facility] and will need to D/C to SNF for ST Rehab to address a decline in her mobility.   Recommendations for follow up therapy are one component of a multi-disciplinary discharge planning process, led by the attending physician.  Recommendations may be updated based on patient status, additional functional criteria and insurance authorization.  Follow Up Recommendations  Skilled nursing-short term rehab (<3 hours/day)     Assistance Recommended at Discharge Frequent or constant Supervision/Assistance  Patient can return home with the following A lot of help with walking and/or transfers;Assistance with cooking/housework;Assist for transportation;Direct supervision/assist for medications management;A lot of help with bathing/dressing/bathroom;Two people to help with walking and/or transfers;Help with stairs or ramp for entrance;Direct supervision/assist for financial management;Assistance with feeding;Two people to help with bathing/dressing/bathroom   Equipment Recommendations  None recommended by PT    Recommendations for Other Services       Precautions / Restrictions Precautions Precautions: Fall Precaution Comments: monitor HR     Mobility  Bed Mobility Overal bed mobility: Needs Assistance Bed Mobility: Supine to Sit, Sit to Supine     Supine to sit: Min assist Sit to supine: Mod assist   General bed mobility comments: Assist for trunk and bil LEs. Increased time. Cues  provided. Increased assist back to bed.    Transfers Overall transfer level: Needs assistance Equipment used: Rolling walker (2 wheels) Transfers: Sit to/from Stand Sit to Stand: Min assist   Step pivot transfers: Mod assist       General transfer comment: pt c/o dizziness upon position change to EOB. Pt requested to lie back down with increased anxiety.  Allowed increased time and obtainined vitals. All WNL.  Pt sat Indep x 7 min at Supervision level.    Ambulation/Gait Ambulation/Gait assistance: Min assist, Mod assist, +2 safety/equipment Gait Distance (Feet): 22 Feet (11 feet x 2 one seated rest break) Assistive device: Rolling walker (2 wheels) Gait Pattern/deviations: Step-through pattern, Decreased stride length Gait velocity: decreased     General Gait Details: very limited amb distance due to pt c/o increased dizziness and increased anxiety.  BP WNL. Recliner following behind as a precaution.  Allowed a seated rest break before amb another 11 feet back to bed.  Very unsteady gait.  HIGH FALL RISK.   Stairs             Wheelchair Mobility    Modified Rankin (Stroke Patients Only)       Balance                                            Cognition Arousal/Alertness: Awake/alert Behavior During Therapy: Anxious Overall Cognitive Status: No family/caregiver present to determine baseline cognitive functioning Area of Impairment: Memory, Safety/judgement                     Memory: Decreased short-term memory   Safety/Judgement:  Decreased awareness of safety     General Comments: AxO x 1 poor recall and required repeat instruction.  Also present with anxiety/fear of falling.  Required constant positive reinforcement.        Exercises      General Comments        Pertinent Vitals/Pain Pain Assessment Pain Assessment: No/denies pain    Home Living                          Prior Function            PT  Goals (current goals can now be found in the care plan section) Progress towards PT goals: Progressing toward goals    Frequency    Min 3X/week      PT Plan Current plan remains appropriate    Co-evaluation              AM-PAC PT "6 Clicks" Mobility   Outcome Measure  Help needed turning from your back to your side while in a flat bed without using bedrails?: A Lot Help needed moving from lying on your back to sitting on the side of a flat bed without using bedrails?: A Lot Help needed moving to and from a bed to a chair (including a wheelchair)?: A Lot Help needed standing up from a chair using your arms (e.g., wheelchair or bedside chair)?: A Lot Help needed to walk in hospital room?: A Lot Help needed climbing 3-5 steps with a railing? : Total 6 Click Score: 11    End of Session Equipment Utilized During Treatment: Gait belt Activity Tolerance: Patient limited by fatigue Patient left: in bed;with call bell/phone within reach;with bed alarm set Nurse Communication: Mobility status PT Visit Diagnosis: Muscle weakness (generalized) (M62.81);Difficulty in walking, not elsewhere classified (R26.2)     Time: 1433-1450 PT Time Calculation (min) (ACUTE ONLY): 17 min  Charges:  $Gait Training: 8-22 mins                    Rica Koyanagi  PTA Acute  Rehabilitation Services Pager      [phone] Office      [phone]

## 2021-09-11 NOTE — Progress Notes (Addendum)
PROGRESS NOTE   Helen [name]  PPJ:[id number] DOB: 09-01-1929 DOA: 09/05/2021 PCP: Vernie Shanks, MD  Brief Narrative:   88 year old white female Known A. fib/Eliquis Combined systolic diastolic HF, HTN, HLD, DM TY 2 Prior stroke Breast cancer  Recent admission 12/28 through 08/26/2021 hemoglobin 5.1-eventually transfused 4 units PRBC--EGD 08/22/2021 normal esophagus, gastric polyps Started on sucralfate and PPI--at that time was discharged on home oxygen  Admit 5/80/9983 metabolic encephalopathy?  Hypoxic Likely secondary to CHF exacerbation in combination with COPD exacerbation  Hospital-Problem based course  Toxic metabolic encephalopathy on admission Likely secondary to possible hypercarbia versus underlying ? dementia She seems coherent this morning Acute hypoxic hypercarbic respiratory failure possibly acute CHF exacerbation Echo shows EF 45 to 50% down from prior echo--- she has a history of cardiomyopathy in 2018 follows with Dr. Oval Linsey Lasix 40 mg IV daily --> p.o. 1/19 Continue Coreg 12.5 twice daily and Entresto twice daily Strict I's/O as able Her weight last hospital stay was 64 kg --currently 59 kg start Diamox 500 bd Paroxysmal A. fib CHADS2 score >4 on Eliquis Continue anticoagulation Continue Coreg 12.5 twice daily for rate control HypERvolemic hyponatremia 2/2 CHF Sodium up from 128-->132, re-check labs am--stedily improved Recent GI bleed transfused 4 units-gastric polyps on exam No further current bleeding continue Protonix 40 daily-resume Carafate but discharged on tablet as it is more affordable  DVT prophylaxis: Eliquis Code Status: DNR Family Communication: called daughter [name]  [phone] Disposition:  Status is: Inpatient  Remains inpatient appropriate because: Electrolyte abnormalities  Consultants:    Procedures:   Antimicrobials:     Subjective:  Awake coherent in nad no focal deficit but feels a bit "fuzzy"  She can  remember time and year and daughter's name No cp  Objective: Vitals:   09/10/21 1506 09/10/21 2047 09/11/21 0500 09/11/21 0509  BP: 118/70 102/90  (!) 102/56  Pulse: 100 65  (!) 53  Resp: 16 18  16   Temp: 97.8 F (36.6 C) 98.4 F (36.9 C)  98.2 F (36.8 C)  TempSrc:  Oral  Oral  SpO2: 100% 97%  97%  Weight:   59.1 kg   Height:        Intake/Output Summary (Last 24 hours) at 09/11/2021 1022 Last data filed at 09/11/2021 0800 Gross per 24 hour  Intake 360 ml  Output --  Net 360 ml    Filed Weights   09/09/21 0500 09/10/21 0500 09/11/21 0500  Weight: 62.4 kg 62.5 kg 59.1 kg    Examination:  Coherent alert, no ict no pallor Neck soft supple--throat not examined Mild JVD  Cta b no added sound no rales no rhonchi Abd soft no rebound no guard Trace LE edema Psych euthymic awake coherent   Data Reviewed: personally reviewed   CBC    Component Value Date/Time   WBC 9.8 09/11/2021 0417   RBC 3.08 (L) 09/11/2021 0417   HGB 8.6 (L) 09/11/2021 0417   HGB 11.4 03/17/2018 1418   HGB 11.7 05/08/2016 1244   HCT 27.1 (L) 09/11/2021 0417   HCT 35.6 03/17/2018 1418   HCT 37.1 05/08/2016 1244   PLT 122 (L) 09/11/2021 0417   PLT 249 03/17/2018 1418   MCV 88.0 09/11/2021 0417   MCV 89 03/17/2018 1418   MCV 84.5 05/08/2016 1244   MCH 27.9 09/11/2021 0417   MCHC 31.7 09/11/2021 0417   RDW 18.6 (H) 09/11/2021 0417   RDW 14.9 03/17/2018 1418   RDW 16.5 (H) 05/08/2016 1244  LYMPHSABS 1.1 09/11/2021 0417   LYMPHSABS 1.7 03/17/2018 1418   LYMPHSABS 1.1 05/08/2016 1244   MONOABS 0.8 09/11/2021 0417   MONOABS 0.5 05/08/2016 1244   EOSABS 0.3 09/11/2021 0417   EOSABS 0.3 03/17/2018 1418   BASOSABS 0.0 09/11/2021 0417   BASOSABS 0.0 03/17/2018 1418   BASOSABS 0.0 05/08/2016 1244   CMP Latest Ref Rng & Units 09/11/2021 09/10/2021 09/09/2021  Glucose 70 - 99 mg/dL 100(H) 112(H) 127(H)  BUN 8 - 23 mg/dL 27(H) 27(H) 22  Creatinine 0.44 - 1.00 mg/dL 1.04(H) 1.25(H) 0.97  Sodium  135 - 145 mmol/L 132(L) 128(L) 129(L)  Potassium 3.5 - 5.1 mmol/L 2.8(L) 3.4(L) 3.6  Chloride 98 - 111 mmol/L 88(L) 84(L) 83(L)  CO2 22 - 32 mmol/L 34(H) 35(H) 36(H)  Calcium 8.9 - 10.3 mg/dL 8.2(L) 8.3(L) 8.4(L)  Total Protein 6.5 - 8.1 g/dL - - -  Total Bilirubin 0.3 - 1.2 mg/dL - - -  Alkaline Phos 38 - 126 U/L - - -  AST 15 - 41 U/L - - -  ALT 0 - 44 U/L - - -     Radiology Studies: No results found.   Scheduled Meds:  acetaZOLAMIDE ER  500 mg Oral Q12H   apixaban  5 mg Oral BID   carvedilol  12.5 mg Oral BID WC   conjugated estrogens  1 Applicatorful Vaginal QHS   furosemide  40 mg Oral Daily   insulin aspart  0-9 Units Subcutaneous TID AC & HS   pantoprazole  40 mg Oral Daily   potassium chloride  40 mEq Oral BID   sacubitril-valsartan  1 tablet Oral BID   sodium chloride flush  3 mL Intravenous Q12H   sodium chloride  1 g Oral TID WC   sucralfate  1 g Oral BID WC   Continuous Infusions:   LOS: 5 days   Time spent: 6  Nita Sells, MD [facility] To contact the attending provider between 7A-7P or the covering provider during after hours 7P-7A, please log into the web site [email or web address] and access using universal  password for that web site. If you do not have the password, please call the hospital operator.  09/11/2021, 10:22 AM

## 2021-09-11 NOTE — Progress Notes (Signed)
°   09/11/21 1748  Assess: MEWS Score  Temp 97.7 F (36.5 C)  BP 100/72  Pulse Rate (!) 103  ECG Heart Rate (!) 103  Resp 20  Level of Consciousness Alert  SpO2 99 %  O2 Device Room Air  Assess: MEWS Score  MEWS Temp 0  MEWS Systolic 1  MEWS Pulse 1  MEWS RR 0  MEWS LOC 0  MEWS Score 2  MEWS Score Color Yellow  Assess: if the MEWS score is Yellow or Red  Were vital signs taken at a resting state? Yes  Focused Assessment No change from prior assessment  Does the patient meet 2 or more of the SIRS criteria? No  MEWS guidelines implemented *See Row Information* No, previously yellow, continue vital signs every 4 hours  Notify: Provider  Provider Name/Title [place]  Date Provider Notified 09/11/21  Time Provider Notified [phone]  Notification Type Page  Notification Reason Other (Comment) (Follow up on pt status)  Provider response See new orders  Date of Provider Response 09/11/21  Time of Provider Response 1828  Document  Progress note created (see row info) Yes  Assess: SIRS CRITERIA  SIRS Temperature  0  SIRS Pulse 1  SIRS Respirations  0  SIRS WBC 0  SIRS Score Sum  1

## 2021-09-12 LAB — CBC WITH DIFFERENTIAL/PLATELET
Abs Immature Granulocytes: 0.08 10*3/uL — ABNORMAL HIGH (ref 0.00–0.07)
Basophils Absolute: 0 10*3/uL (ref 0.0–0.1)
Basophils Relative: 0 %
Eosinophils Absolute: 0.2 10*3/uL (ref 0.0–0.5)
Eosinophils Relative: 2 %
HCT: 26.8 % — ABNORMAL LOW (ref 36.0–46.0)
Hemoglobin: 8.3 g/dL — ABNORMAL LOW (ref 12.0–15.0)
Immature Granulocytes: 1 %
Lymphocytes Relative: 11 %
Lymphs Abs: 1.1 10*3/uL (ref 0.7–4.0)
MCH: 27.2 pg (ref 26.0–34.0)
MCHC: 31 g/dL (ref 30.0–36.0)
MCV: 87.9 fL (ref 80.0–100.0)
Monocytes Absolute: 0.9 10*3/uL (ref 0.1–1.0)
Monocytes Relative: 10 %
Neutro Abs: 7 10*3/uL (ref 1.7–7.7)
Neutrophils Relative %: 76 %
Platelets: 117 10*3/uL — ABNORMAL LOW (ref 150–400)
RBC: 3.05 MIL/uL — ABNORMAL LOW (ref 3.87–5.11)
RDW: 19 % — ABNORMAL HIGH (ref 11.5–15.5)
WBC: 9.3 10*3/uL (ref 4.0–10.5)
nRBC: 0 % (ref 0.0–0.2)

## 2021-09-12 LAB — GLUCOSE, CAPILLARY
Glucose-Capillary: 95 mg/dL (ref 70–99)
Glucose-Capillary: 114 mg/dL — ABNORMAL HIGH (ref 70–99)
Glucose-Capillary: 124 mg/dL — ABNORMAL HIGH (ref 70–99)
Glucose-Capillary: 141 mg/dL — ABNORMAL HIGH (ref 70–99)

## 2021-09-12 LAB — COMPREHENSIVE METABOLIC PANEL
ALT: 20 U/L (ref 0–44)
AST: 13 U/L — ABNORMAL LOW (ref 15–41)
Albumin: 2.8 g/dL — ABNORMAL LOW (ref 3.5–5.0)
Alkaline Phosphatase: 43 U/L (ref 38–126)
Anion gap: 8 (ref 5–15)
BUN: 30 mg/dL — ABNORMAL HIGH (ref 8–23)
CO2: 31 mmol/L (ref 22–32)
Calcium: 7.9 mg/dL — ABNORMAL LOW (ref 8.9–10.3)
Chloride: 92 mmol/L — ABNORMAL LOW (ref 98–111)
Creatinine, Ser: 1.23 mg/dL — ABNORMAL HIGH (ref 0.44–1.00)
GFR, Estimated: 42 mL/min — ABNORMAL LOW (ref >60)
Glucose, Bld: 97 mg/dL (ref 70–99)
Potassium: 3 mmol/L — ABNORMAL LOW (ref 3.5–5.1)
Sodium: 131 mmol/L — ABNORMAL LOW (ref 135–145)
Total Bilirubin: 0.6 mg/dL (ref 0.3–1.2)
Total Protein: 5.8 g/dL — ABNORMAL LOW (ref 6.5–8.1)

## 2021-09-12 LAB — MAGNESIUM: Magnesium: 1.9 mg/dL (ref 1.7–2.4)

## 2021-09-12 MED ORDER — ACETAZOLAMIDE ER 500 MG PO CP12
500.0000 mg | ORAL_CAPSULE | Freq: Every day | ORAL | Status: DC
  Filled 2021-09-12: qty 1

## 2021-09-12 MED ORDER — FUROSEMIDE 20 MG PO TABS: 20.0000 mg | ORAL_TABLET | Freq: Every day | ORAL | Status: DC

## 2021-09-12 MED ORDER — POTASSIUM CHLORIDE CRYS ER 20 MEQ PO TBCR
80.0000 meq | EXTENDED_RELEASE_TABLET | Freq: Two times a day (BID) | ORAL | Status: DC
  Administered 2021-09-12: 80 meq via ORAL
  Filled 2021-09-12: qty 4

## 2021-09-12 NOTE — Progress Notes (Signed)
Foley d/c'ed for voiding trial. Will continue to monitor.  Aasha Dina

## 2021-09-12 NOTE — Progress Notes (Addendum)
PROGRESS NOTE   Helen [name]  FUX:[id number] DOB: 12/09/1929 DOA: 09/05/2021 PCP: Vernie Shanks, MD  Brief Narrative:   88 year old white female Known A. fib/Eliquis Combined systolic diastolic HF, HTN, HLD, DM TY 2 Prior stroke Breast cancer  Recent admission 12/28 through 08/26/2021 hemoglobin 5.1-eventually transfused 4 units PRBC--EGD 08/22/2021 normal esophagus, gastric polyps Started on sucralfate and PPI--at that time was discharged on home oxygen  Admit 0/25/4270 metabolic encephalopathy?  Hypoxic Likely secondary to CHF exacerbation in combination with COPD exacerbation    Hospital-Problem based course  Toxic metabolic encephalopathy on admission secondary to possible hypercarbia  She  is very coherent now Acute hypoxic hypercarbic respiratory failure possibly acute CHF exacerbation [cardiomyopathy in 2018 follows with Dr. Oval Linsey Echo shows EF 45 to 50% down from prior echo of  Lasix 40 mg IV daily -->20 mg daily on d/c lowered Coreg to 6.25 bid 09/12/21 cont Entresto twice daily -900 cc  Her weight last hospital stay was 64 kg --currently 59 kg Cut back diamox to 500 daily Paroxysmal A. fib CHADS2 score >4 on Eliquis Continue anticoagulation Continue Coreg 12.5 twice daily for rate control HypERvolemic hyponatremia 2/2 CHF Sodium up from 128-->131 Hypokalemia 2/2 diuresis--increase Kdur to 80 bid Recent GI bleed transfused 4 units-gastric polyps on exam No further current bleeding continue Protonix 40 daily-resume Carafate but discharged on tablet as it is more affordable  DVT prophylaxis: Eliquis Code Status: DNR Family Communication: called daughter [name]  [phone] Disposition:  Status is: Inpatient  Remains inpatient appropriate because: Electrolyte abnormalities  Consultants:    Procedures:   Antimicrobials:     Subjective:  Feels fair No fever Breathing about the same No cough no sputum  Objective: Vitals:   09/12/21 0500  09/12/21 1000 09/12/21 1012 09/12/21 1148  BP: 115/64 (!) 93/46 (!) 110/56 133/86  Pulse: 75 79 81 75  Resp:  16  16  Temp: 97.6 F (36.4 C)   97.7 F (36.5 C)  TempSrc: Oral   Oral  SpO2: 100%   100%  Weight:      Height:        Intake/Output Summary (Last 24 hours) at 09/12/2021 1209 Last data filed at 09/12/2021 0955 Gross per 24 hour  Intake 490 ml  Output 2740 ml  Net -2250 ml    Filed Weights   09/09/21 0500 09/10/21 0500 09/11/21 0500  Weight: 62.4 kg 62.5 kg 59.1 kg    Examination:  No jvd, no rales rhonchi No wheeze Abd soft nt nd no reboun dno guard Neuro intact grossly to power S1 s 2no m/r/g Eomi ncat     Data Reviewed: personally reviewed   CBC    Component Value Date/Time   WBC 9.3 09/12/2021 0420   RBC 3.05 (L) 09/12/2021 0420   HGB 8.3 (L) 09/12/2021 0420   HGB 11.4 03/17/2018 1418   HGB 11.7 05/08/2016 1244   HCT 26.8 (L) 09/12/2021 0420   HCT 35.6 03/17/2018 1418   HCT 37.1 05/08/2016 1244   PLT 117 (L) 09/12/2021 0420   PLT 249 03/17/2018 1418   MCV 87.9 09/12/2021 0420   MCV 89 03/17/2018 1418   MCV 84.5 05/08/2016 1244   MCH 27.2 09/12/2021 0420   MCHC 31.0 09/12/2021 0420   RDW 19.0 (H) 09/12/2021 0420   RDW 14.9 03/17/2018 1418   RDW 16.5 (H) 05/08/2016 1244   LYMPHSABS 1.1 09/12/2021 0420   LYMPHSABS 1.7 03/17/2018 1418   LYMPHSABS 1.1 05/08/2016 1244   MONOABS 0.9  09/12/2021 0420   MONOABS 0.5 05/08/2016 1244   EOSABS 0.2 09/12/2021 0420   EOSABS 0.3 03/17/2018 1418   BASOSABS 0.0 09/12/2021 0420   BASOSABS 0.0 03/17/2018 1418   BASOSABS 0.0 05/08/2016 1244   CMP Latest Ref Rng & Units 09/12/2021 09/11/2021 09/10/2021  Glucose 70 - 99 mg/dL 97 100(H) 112(H)  BUN 8 - 23 mg/dL 30(H) 27(H) 27(H)  Creatinine 0.44 - 1.00 mg/dL 1.23(H) 1.04(H) 1.25(H)  Sodium 135 - 145 mmol/L 131(L) 132(L) 128(L)  Potassium 3.5 - 5.1 mmol/L 3.0(L) 2.8(L) 3.4(L)  Chloride 98 - 111 mmol/L 92(L) 88(L) 84(L)  CO2 22 - 32 mmol/L 31 34(H) 35(H)   Calcium 8.9 - 10.3 mg/dL 7.9(L) 8.2(L) 8.3(L)  Total Protein 6.5 - 8.1 g/dL 5.8(L) - -  Total Bilirubin 0.3 - 1.2 mg/dL 0.6 - -  Alkaline Phos 38 - 126 U/L 43 - -  AST 15 - 41 U/L 13(L) - -  ALT 0 - 44 U/L 20 - -     Radiology Studies: No results found.   Scheduled Meds:  [START ON 09/13/2021] acetaZOLAMIDE ER  500 mg Oral Daily   apixaban  2.5 mg Oral BID   carvedilol  6.25 mg Oral BID WC   conjugated estrogens  1 Applicatorful Vaginal QHS   [START ON 09/13/2021] furosemide  20 mg Oral Daily   insulin aspart  0-9 Units Subcutaneous TID AC & HS   pantoprazole  40 mg Oral Daily   potassium chloride  80 mEq Oral BID   sacubitril-valsartan  1 tablet Oral BID   sodium chloride flush  3 mL Intravenous Q12H   sodium chloride  1 g Oral TID WC   sucralfate  1 g Oral BID WC   Continuous Infusions:   LOS: 6 days   Time spent: [place], MD [facility] To contact the attending provider between 7A-7P or the covering provider during after hours 7P-7A, please log into the web site [email or web address] and access using universal [place] password for that web site. If you do not have the password, please call the hospital operator.  09/12/2021, 12:09 PM

## 2021-09-12 NOTE — Care Management Important Message (Signed)
Important Message  Patient Details IM Letter placed in Patients room. Name: Helen [name] MRN: [id number] Date of Birth: 01/28/1930   Medicare Important Message Given:  Yes     Kerin Salen 09/12/2021, 11:28 AM

## 2021-09-12 NOTE — Progress Notes (Signed)
Pt is resting well on  and no resp distress noted. Bipap is not needed at this time.

## 2021-09-12 NOTE — TOC Progression Note (Signed)
Transition of Care [facility]) - Progression Note    Patient Details  Name: Helen [name] MRN: [id number] Date of Birth: November 22, 1929  Transition of Care [facility]) CM/SW Contact  Corisa Montini, Juliann Pulse, RN Phone Number: 09/12/2021, 12:43 PM  Clinical Narrative:  To return [facility] in am-they can accept;rep [facility] aware. Covid to be ordered.       Barriers to Discharge: Continued Medical Work up  Expected Discharge Plan and Services                                                 Social Determinants of Health (SDOH) Interventions    Readmission Risk Interventions Readmission Risk Prevention Plan 08/22/2021 08/21/2021  Transportation Screening Complete Complete  PCP or Specialist Appt within 5-7 Days - Complete  PCP or Specialist Appt within 3-5 Days Complete -  Home Care Screening - Complete  Medication Review (RN CM) - Complete  HRI or Home Care Consult Complete -  Social Work Consult for Recovery Care Planning/Counseling Complete -  Palliative Care Screening Complete -  Medication Review Press photographer) Complete -  Some recent data might be hidden

## 2021-09-12 NOTE — Progress Notes (Signed)
Chaplain provided spiritual care consult for Helen [name] this afternoon.  She was alert and eager to talk with Chaplain, however, she stated she requested a visit from a Preist.  Will follow up with her local Rudi Heap at [facility] on [address] in [place]. Provided spiritual care and counsel for her as she spoke of her large family of 9 children and numerous grand children and great grandchildren.   Ended visit with a departing blessing.      09/12/21 1600  Clinical Encounter Type  Visited With Patient  Visit Type Initial;Spiritual support  Referral From Nurse  Consult/Referral To Chaplain  Spiritual Encounters  Spiritual Needs Sacred text;Prayer;Emotional  Stress Factors  Patient Stress Factors Major life changes  Family Stress Factors None identified

## 2021-09-12 NOTE — Plan of Care (Signed)
°  Problem: Education: °Goal: Ability to demonstrate management of disease process will improve °Outcome: Progressing °Goal: Ability to verbalize understanding of medication therapies will improve °Outcome: Progressing °Goal: Individualized Educational Video(s) °Outcome: Progressing °  °

## 2021-09-13 LAB — CBC
HCT: 28.9 % — ABNORMAL LOW (ref 36.0–46.0)
Hemoglobin: 8.4 g/dL — ABNORMAL LOW (ref 12.0–15.0)
MCH: 27.5 pg (ref 26.0–34.0)
MCHC: 29.1 g/dL — ABNORMAL LOW (ref 30.0–36.0)
MCV: 94.8 fL (ref 80.0–100.0)
Platelets: 127 10*3/uL — ABNORMAL LOW (ref 150–400)
RBC: 3.05 MIL/uL — ABNORMAL LOW (ref 3.87–5.11)
RDW: 19.4 % — ABNORMAL HIGH (ref 11.5–15.5)
WBC: 9.3 10*3/uL (ref 4.0–10.5)
nRBC: 0 % (ref 0.0–0.2)

## 2021-09-13 LAB — BASIC METABOLIC PANEL
Anion gap: 6 (ref 5–15)
BUN: 24 mg/dL — ABNORMAL HIGH (ref 8–23)
CO2: 27 mmol/L (ref 22–32)
Calcium: 8.3 mg/dL — ABNORMAL LOW (ref 8.9–10.3)
Chloride: 98 mmol/L (ref 98–111)
Creatinine, Ser: 0.98 mg/dL (ref 0.44–1.00)
GFR, Estimated: 55 mL/min — ABNORMAL LOW (ref >60)
Glucose, Bld: 95 mg/dL (ref 70–99)
Potassium: 4.5 mmol/L (ref 3.5–5.1)
Sodium: 131 mmol/L — ABNORMAL LOW (ref 135–145)

## 2021-09-13 LAB — GLUCOSE, CAPILLARY
Glucose-Capillary: 100 mg/dL — ABNORMAL HIGH (ref 70–99)
Glucose-Capillary: 128 mg/dL — ABNORMAL HIGH (ref 70–99)
Glucose-Capillary: 111 mg/dL — ABNORMAL HIGH (ref 70–99)
Glucose-Capillary: 122 mg/dL — ABNORMAL HIGH (ref 70–99)

## 2021-09-13 MED ORDER — FUROSEMIDE 20 MG PO TABS
20.0000 mg | ORAL_TABLET | Freq: Once | ORAL | Status: AC
  Administered 2021-09-13: 20 mg via ORAL
  Filled 2021-09-13: qty 1

## 2021-09-13 MED ORDER — POTASSIUM CHLORIDE CRYS ER 20 MEQ PO TBCR
40.0000 meq | EXTENDED_RELEASE_TABLET | Freq: Every day | ORAL | Status: DC
  Administered 2021-09-14 – 2021-09-15 (×2): 40 meq via ORAL
  Filled 2021-09-13 (×3): qty 2

## 2021-09-13 MED ORDER — FUROSEMIDE 40 MG PO TABS
40.0000 mg | ORAL_TABLET | Freq: Every morning | ORAL | Status: DC
  Administered 2021-09-14 – 2021-09-15 (×2): 40 mg via ORAL
  Filled 2021-09-13 (×3): qty 1

## 2021-09-13 MED ORDER — FUROSEMIDE 20 MG PO TABS
20.0000 mg | ORAL_TABLET | Freq: Every evening | ORAL | Status: DC
  Administered 2021-09-13 – 2021-09-14 (×2): 20 mg via ORAL
  Filled 2021-09-13 (×2): qty 1

## 2021-09-13 NOTE — NC FL2 (Signed)
[place] LEVEL OF CARE SCREENING TOOL     IDENTIFICATION  Patient Name: Helen [name] Birthdate: 1930-05-18 Sex: female Admission Date (Current Location): 09/05/2021  [facility] and [place] Number:  Herbalist and Address:  [facility],  [address], [place]      Provider Number: [id number]  Attending Physician Name and Address:  Nita Sells, MD  Relative Name and Phone Number:  [facility] Daughter [phone]    Joslyn Hy Daughter [phone]    [place], [place] [phone]    [place] Daughter [phone]  [phone]  [place], [place]   [phone]    Current Level of Care: Hospital Recommended Level of Care: [place] Prior Approval Number:    Date Approved/Denied:   PASRR Number: [id number]  Discharge Plan: SNF    Current Diagnoses: Patient Active Problem List   Diagnosis Date Noted   Acute respiratory failure with hypoxia ([place]) 09/06/2021   CHF exacerbation ([place]) 09/05/2021   Symptomatic anemia 08/20/2021   Occult blood in stools 08/20/2021   COVID-19 01/04/2021   CAP (community acquired pneumonia) 01/03/2021   Nonischemic cardiomyopathy ([place]) 12/08/2016   SOB (shortness of breath) 11/12/2016   Shortness of breath 11/11/2016   Chronic respiratory failure with hypoxia ([place]) 11/11/2016   Anemia of chronic disease 03/24/2016   Chronic combined systolic and diastolic heart failure ([place]) 03/24/2016   Acute respiratory failure with hypoxia and hypercarbia (HCC) 03/24/2016   Scarring of lung following radiation- RLL 03/24/2016   Essential hypertension    Acute encephalopathy 12/13/2015   Hyponatremia 12/13/2015   Hypokalemia 12/13/2015   UTI (lower urinary tract infection) 12/13/2015   Leukocytosis 12/13/2015   Hypercholesteremia    Diabetes mellitus without complication ([place])    History of stroke    Peripheral vascular disease ([place])    Carotid stenosis 03/13/2015     Orientation RESPIRATION BLADDER Height & Weight     Self, Time, Situation, Place  O2 (2L) Continent Weight: 130 lb 4.7 oz (59.1 kg) Height:  5' 4.5" (163.8 cm)  BEHAVIORAL SYMPTOMS/MOOD NEUROLOGICAL BOWEL NUTRITION STATUS      Continent Diet (Regular diet)  AMBULATORY STATUS COMMUNICATION OF NEEDS Skin   Limited Assist Verbally Normal                       Personal Care Assistance Level of Assistance  Bathing, Dressing, Feeding Bathing Assistance: Limited assistance Feeding assistance: Independent Dressing Assistance: Limited assistance     Functional Limitations Info  Sight, Speech, Hearing Sight Info: Adequate Hearing Info: Adequate Speech Info: Adequate    SPECIAL CARE FACTORS FREQUENCY  PT (By licensed PT), OT (By licensed OT)     PT Frequency: Minimum 5x a week OT Frequency: Minimum 5x a week            Contractures Contractures Info: Not present    Additional Factors Info  Code Status, Allergies, Insulin Sliding Scale Code Status Info: DNR Allergies Info: Morphine And Related   Other   Sulfa Antibiotics   Sulfasalazine   Citalopram   Nickel   Insulin Sliding Scale Info: insulin aspart (novoLOG) injection 0-9 Units 3x a day with meals       Current Medications (09/13/2021):  This is the current hospital active medication list Current Facility-Administered Medications  Medication Dose Route Frequency Provider Last Rate Last Admin   acetaminophen (TYLENOL) tablet 650 mg  650 mg Oral Q6H PRN Swayze, Ava, DO   650 mg at 09/11/21 [phone]  apixaban (ELIQUIS) tablet 2.5 mg  2.5 mg Oral BID Nita Sells, MD   2.5 mg at 09/13/21 1036   carvedilol (COREG) tablet 6.25 mg  6.25 mg Oral BID WC Nita Sells, MD   6.25 mg at 09/13/21 1037   conjugated estrogens (PREMARIN) vaginal cream 1 Applicatorful  1 Applicatorful Vaginal QHS Swayze, Ava, DO   1 Applicatorful at 76/73/41 2012   furosemide (LASIX) tablet 20 mg  20 mg Oral QPM Nita Sells,  MD       Derrill Memo ON 09/14/2021] furosemide (LASIX) tablet 40 mg  40 mg Oral q AM Samtani, Jai-Gurmukh, MD       insulin aspart (novoLOG) injection 0-9 Units  0-9 Units Subcutaneous TID AC & HS Swayze, Ava, DO   1 Units at 09/13/21 1355   pantoprazole (PROTONIX) EC tablet 40 mg  40 mg Oral Daily Marcelyn Bruins, MD   40 mg at 09/13/21 1037   polyethylene glycol (MIRALAX / GLYCOLAX) packet 17 g  17 g Oral Daily PRN Marcelyn Bruins, MD       [START ON 09/14/2021] potassium chloride SA (KLOR-CON M) CR tablet 40 mEq  40 mEq Oral Daily Nita Sells, MD       sacubitril-valsartan (ENTRESTO) 49-51 mg per tablet  1 tablet Oral BID Marcelyn Bruins, MD   1 tablet at 09/13/21 1037   sodium chloride flush (NS) 0.9 % injection 3 mL  3 mL Intravenous Q12H Marcelyn Bruins, MD   3 mL at 09/13/21 1046   sodium chloride tablet 1 g  1 g Oral TID WC Swayze, Ava, DO   1 g at 09/13/21 1353   sucralfate (CARAFATE) tablet 1 g  1 g Oral BID WC Nita Sells, MD   1 g at 09/13/21 1036     Discharge Medications: Please see discharge summary for a list of discharge medications.  Relevant Imaging Results:  Relevant Lab Results:   Additional Information SSN [id number]  Ross Ludwig, LCSW

## 2021-09-13 NOTE — Plan of Care (Signed)
°  Problem: Clinical Measurements: Goal: Diagnostic test results will improve Outcome: Progressing Goal: Respiratory complications will improve Outcome: Progressing   Problem: Nutrition: Goal: Adequate nutrition will be maintained Outcome: Progressing   Problem: Pain Managment: Goal: General experience of comfort will improve Outcome: Progressing   Problem: Skin Integrity: Goal: Risk for impaired skin integrity will decrease Outcome: Progressing   Problem: Elimination: Goal: Will not experience complications related to urinary retention Outcome: Not Progressing

## 2021-09-13 NOTE — Progress Notes (Signed)
PROGRESS NOTE   Christi Wirick  XIP:[id number] DOB: 12/28/1929 DOA: 09/05/2021 PCP: Vernie Shanks, MD  Brief Narrative:   88 year old white female Known A. fib/Eliquis Combined systolic diastolic HF, HTN, HLD, DM TY 2 Prior stroke Breast cancer  Recent admission 12/28 through 08/26/2021 hemoglobin 5.1-eventually transfused 4 units PRBC--EGD 08/22/2021 normal esophagus, gastric polyps Started on sucralfate and PPI--at that time was discharged on home oxygen  Admit 6/73/4193 metabolic encephalopathy?  Hypoxic Likely secondary to CHF exacerbation in combination with COPD exacerbation  She is currently being cautiously diuresed  Hospital-Problem based course  Toxic metabolic encephalopathy on admission secondary to possible hypercarbia  She  is very coherent now Acute hypoxic hypercarbic respiratory failure possibly acute CHF exacerbation [cardiomyopathy in 2018 follows with Dr. Oval Linsey Echo shows EF 45 to 50% down from prior echo of  Lasix 40 mg IV daily -->20 mg daily on 1/20 but she feels more short of breath today Trial 40 mg in the morning of Lasix and 20 mg p.m. and reevaluate -1.4 L since admission lowered Coreg to 6.25 bid 09/12/21 cont Entresto twice daily Needs standing daily weights Diamox was discontinued on 1/21 Paroxysmal A. fib CHADS2 score >4 on Eliquis Continue anticoagulation Continue Coreg 12.5 twice daily for rate control HypERvolemic hyponatremia 2/2 CHF Sodium up from 128-->131 Hypokalemia 2/2 diuresis--adjusted K. Dur Recent GI bleed transfused 4 units-gastric polyps on exam No further current bleeding continue Protonix 40 daily-resume Carafate but discharged on tablet as it is more affordable  DVT prophylaxis: Eliquis Code Status: DNR Family Communication: called daughter [name]  [phone] and updated her on 1/21 Disposition:  Status is: Inpatient  Remains inpatient appropriate because: Electrolyte abnormalities  Consultants:     Procedures:   Antimicrobials:     Subjective:  Patient feels well still feels short of breath-seems also a little worried about moving around We have asked for her to ambulate and be up and about in the room at least today No chest pain fever chills and is eating and drinking  Objective: Vitals:   09/12/21 2219 09/13/21 0053 09/13/21 0500 09/13/21 0804  BP: (!) 101/48 (!) 118/92 116/75 (!) 104/57  Pulse: 95 (!) 110 77 (!) 48  Resp:    16  Temp:  97.7 F (36.5 C) 97.6 F (36.4 C) 97.7 F (36.5 C)  TempSrc:  Oral Oral Oral  SpO2:   97% 100%  Weight:      Height:        Intake/Output Summary (Last 24 hours) at 09/13/2021 1003 Last data filed at 09/12/2021 2200 Gross per 24 hour  Intake 360 ml  Output 700 ml  Net -340 ml    Filed Weights   09/09/21 0500 09/10/21 0500 09/11/21 0500  Weight: 62.4 kg 62.5 kg 59.1 kg    Examination:  EOMI NCAT no icterus no pallor Throat is soft and supple She has mild right-sided posterior wheeze Abd soft nt nd no rebound no guard Neuro intact grossly to power S1 s 2no m/r/g Eomi ncat     Data Reviewed: personally reviewed   CBC    Component Value Date/Time   WBC 9.3 09/12/2021 2236   RBC 3.05 (L) 09/12/2021 2236   HGB 8.4 (L) 09/12/2021 2236   HGB 11.4 03/17/2018 1418   HGB 11.7 05/08/2016 1244   HCT 28.9 (L) 09/12/2021 2236   HCT 35.6 03/17/2018 1418   HCT 37.1 05/08/2016 1244   PLT 127 (L) 09/12/2021 2236   PLT 249 03/17/2018 1418  MCV 94.8 09/12/2021 2236   MCV 89 03/17/2018 1418   MCV 84.5 05/08/2016 1244   MCH 27.5 09/12/2021 2236   MCHC 29.1 (L) 09/12/2021 2236   RDW 19.4 (H) 09/12/2021 2236   RDW 14.9 03/17/2018 1418   RDW 16.5 (H) 05/08/2016 1244   LYMPHSABS 1.1 09/12/2021 0420   LYMPHSABS 1.7 03/17/2018 1418   LYMPHSABS 1.1 05/08/2016 1244   MONOABS 0.9 09/12/2021 0420   MONOABS 0.5 05/08/2016 1244   EOSABS 0.2 09/12/2021 0420   EOSABS 0.3 03/17/2018 1418   BASOSABS 0.0 09/12/2021 0420    BASOSABS 0.0 03/17/2018 1418   BASOSABS 0.0 05/08/2016 1244   CMP Latest Ref Rng & Units 09/13/2021 09/12/2021 09/11/2021  Glucose 70 - 99 mg/dL 95 97 100(H)  BUN 8 - 23 mg/dL 24(H) 30(H) 27(H)  Creatinine 0.44 - 1.00 mg/dL 0.98 1.23(H) 1.04(H)  Sodium 135 - 145 mmol/L 131(L) 131(L) 132(L)  Potassium 3.5 - 5.1 mmol/L 4.5 3.0(L) 2.8(L)  Chloride 98 - 111 mmol/L 98 92(L) 88(L)  CO2 22 - 32 mmol/L 27 31 34(H)  Calcium 8.9 - 10.3 mg/dL 8.3(L) 7.9(L) 8.2(L)  Total Protein 6.5 - 8.1 g/dL - 5.8(L) -  Total Bilirubin 0.3 - 1.2 mg/dL - 0.6 -  Alkaline Phos 38 - 126 U/L - 43 -  AST 15 - 41 U/L - 13(L) -  ALT 0 - 44 U/L - 20 -     Radiology Studies: No results found.   Scheduled Meds:  acetaZOLAMIDE ER  500 mg Oral Daily   apixaban  2.5 mg Oral BID   carvedilol  6.25 mg Oral BID WC   conjugated estrogens  1 Applicatorful Vaginal QHS   furosemide  20 mg Oral Daily   insulin aspart  0-9 Units Subcutaneous TID AC & HS   pantoprazole  40 mg Oral Daily   potassium chloride  80 mEq Oral BID   sacubitril-valsartan  1 tablet Oral BID   sodium chloride flush  3 mL Intravenous Q12H   sodium chloride  1 g Oral TID WC   sucralfate  1 g Oral BID WC   Continuous Infusions:   LOS: 7 days   Time spent: 30  Nita Sells, MD [facility] To contact the attending provider between 7A-7P or the covering provider during after hours 7P-7A, please log into the web site [email or web address] and access using universal [place] password for that web site. If you do not have the password, please call the hospital operator.  09/13/2021, 10:03 AM

## 2021-09-13 NOTE — TOC Progression Note (Signed)
Transition of Care [facility]) - Progression Note    Patient Details  Name: Helen [name] MRN: [id number] Date of Birth: 1929/08/30  Transition [facility]) CM/SW Contact  Ross Ludwig, [place] Phone Number: 09/13/2021, 11:08 AM  Clinical Narrative:     CSW spoke to patient's daughter [name] via phone.  Per [name], they do not want patient to return to [facility].  They are interested in knowing what other facilities can consider patient.  CSW was given permission to begin bed search in [facility].  TOC to update her on bed offers made.  CSW to continue to follow patient's progress throughout discharge planning.     Barriers to Discharge: Continued Medical Work up  Expected Discharge Plan and Services                                                 Social Determinants of Health (SDOH) Interventions    Readmission Risk Interventions Readmission Risk Prevention Plan 09/12/2021 08/22/2021 08/21/2021  Transportation Screening Complete Complete Complete  PCP or Specialist Appt within 5-7 Days - - Complete  PCP or Specialist Appt within 3-5 Days Complete Complete -  Home Care Screening - - Complete  Medication Review (RN CM) - - Complete  HRI or Home Care Consult Complete Complete -  Social Work Consult for [place] Planning/Counseling Complete Complete -  Palliative Care Screening Complete Complete -  Medication Review Press photographer) Complete Complete -  Some recent data might be hidden

## 2021-09-14 LAB — COMPREHENSIVE METABOLIC PANEL
ALT: 16 U/L (ref 0–44)
AST: 12 U/L — ABNORMAL LOW (ref 15–41)
Albumin: 2.8 g/dL — ABNORMAL LOW (ref 3.5–5.0)
Alkaline Phosphatase: 47 U/L (ref 38–126)
Anion gap: 9 (ref 5–15)
BUN: 18 mg/dL (ref 8–23)
CO2: 27 mmol/L (ref 22–32)
Calcium: 8.6 mg/dL — ABNORMAL LOW (ref 8.9–10.3)
Chloride: 96 mmol/L — ABNORMAL LOW (ref 98–111)
Creatinine, Ser: 0.9 mg/dL (ref 0.44–1.00)
GFR, Estimated: >60 mL/min (ref >60)
Glucose, Bld: 110 mg/dL — ABNORMAL HIGH (ref 70–99)
Potassium: 3.8 mmol/L (ref 3.5–5.1)
Sodium: 132 mmol/L — ABNORMAL LOW (ref 135–145)
Total Bilirubin: 0.7 mg/dL (ref 0.3–1.2)
Total Protein: 6.2 g/dL — ABNORMAL LOW (ref 6.5–8.1)

## 2021-09-14 LAB — GLUCOSE, CAPILLARY
Glucose-Capillary: 110 mg/dL — ABNORMAL HIGH (ref 70–99)
Glucose-Capillary: 112 mg/dL — ABNORMAL HIGH (ref 70–99)
Glucose-Capillary: 110 mg/dL — ABNORMAL HIGH (ref 70–99)
Glucose-Capillary: 117 mg/dL — ABNORMAL HIGH (ref 70–99)

## 2021-09-14 NOTE — Progress Notes (Signed)
PROGRESS NOTE   Helen [name]  NTZ:[id number] DOB: [birth year] DOA: 09/05/2021 PCP: Vernie Shanks, MD  Brief Narrative:   88 year old white female Known A. fib/Eliquis Combined systolic diastolic HF, HTN, HLD, DM TY 2 Prior stroke Breast cancer  Recent admission 12/28 through 08/26/2021 hemoglobin 5.1-eventually transfused 4 units PRBC--EGD 08/22/2021 normal esophagus, gastric polyps Started on sucralfate and PPI--at that time was discharged on home oxygen  Admit 6/75/9163 metabolic encephalopathy?  Hypoxic Likely secondary to CHF exacerbation in combination with COPD exacerbation  She is currently being cautiously diuresed, and is improving for probable SNF placement 1/23  Hospital-Problem based course  Toxic metabolic encephalopathy on admission secondary to possible hypercarbia  coherent now Acute hypoxic hypercarbic respiratory failure possibly acute CHF exacerbation [cardiomyopathy in 2018 follows with Dr. Oval Linsey Echo shows EF 45 to 50% down from prior echo of  Lasix now 40 mg in the morning of Lasix and 20 mg p.m.  -2.37 L since admission lowered Coreg to 6.25 bid 09/12/21 cont Entresto twice daily Diamox was discontinued on 1/21 Urinary retention x 3 this admit Keep Foley on d/c--will need OP Urology visits Paroxysmal A. fib CHADS2 score >4 on Eliquis Continue anticoagulation Continue Coreg 12.5 twice daily for rate control HypERvolemic hyponatremia 2/2 CHF Resolving slowly Hypokalemia 2/2 diuresis--adjusted K. Dur Recent GI bleed transfused 4 units-gastric polyps on exam No further current bleeding continue Protonix 40 daily-resume Carafate tabs  DVT prophylaxis: Eliquis Code Status: DNR Family Communication:  daughter [name]  [phone]  updated 1/21 Disposition:  Status is: Inpatient  Remains inpatient appropriate because: Electrolyte abnormalities  Consultants:    Procedures:   Antimicrobials:     Subjective:  Better overall, although  slept poorly Relieved she has foley No CP Some winded still but overall improved   Objective: Vitals:   09/13/21 1031 09/13/21 1604 09/13/21 1958 09/14/21 0524  BP: 109/65 119/60 (!) 106/55 101/71  Pulse:  (!) 36 89 76  Resp: 14 16    Temp:  97.7 F (36.5 C) 98.5 F (36.9 C) (!) 97.4 F (36.3 C)  TempSrc:  Oral Axillary Oral  SpO2:  100% 100% 99%  Weight:      Height:        Intake/Output Summary (Last 24 hours) at 09/14/2021 0934 Last data filed at 09/14/2021 0901 Gross per 24 hour  Intake 240 ml  Output 1450 ml  Net -1210 ml    Filed Weights   09/09/21 0500 09/10/21 0500 09/11/21 0500  Weight: 62.4 kg 62.5 kg 59.1 kg    Examination:  Eomi ncat no focal deficit Decreased post lat rales No JVD S1 s 2no m/r/g Abd soft nt nd no rebound no guard No LE edema Neuro intact   Data Reviewed: personally reviewed   CBC    Component Value Date/Time   WBC 9.3 09/12/2021 2236   RBC 3.05 (L) 09/12/2021 2236   HGB 8.4 (L) 09/12/2021 2236   HGB 11.4 03/17/2018 1418   HGB 11.7 05/08/2016 1244   HCT 28.9 (L) 09/12/2021 2236   HCT 35.6 03/17/2018 1418   HCT 37.1 05/08/2016 1244   PLT 127 (L) 09/12/2021 2236   PLT 249 03/17/2018 1418   MCV 94.8 09/12/2021 2236   MCV 89 03/17/2018 1418   MCV 84.5 05/08/2016 1244   MCH 27.5 09/12/2021 2236   MCHC 29.1 (L) 09/12/2021 2236   RDW 19.4 (H) 09/12/2021 2236   RDW 14.9 03/17/2018 1418   RDW 16.5 (H) 05/08/2016 1244   LYMPHSABS 1.1  09/12/2021 0420   LYMPHSABS 1.7 03/17/2018 1418   LYMPHSABS 1.1 05/08/2016 1244   MONOABS 0.9 09/12/2021 0420   MONOABS 0.5 05/08/2016 1244   EOSABS 0.2 09/12/2021 0420   EOSABS 0.3 03/17/2018 1418   BASOSABS 0.0 09/12/2021 0420   BASOSABS 0.0 03/17/2018 1418   BASOSABS 0.0 05/08/2016 1244   CMP Latest Ref Rng & Units 09/14/2021 09/13/2021 09/12/2021  Glucose 70 - 99 mg/dL 110(H) 95 97  BUN 8 - 23 mg/dL 18 24(H) 30(H)  Creatinine 0.44 - 1.00 mg/dL 0.90 0.98 1.23(H)  Sodium 135 - 145 mmol/L  132(L) 131(L) 131(L)  Potassium 3.5 - 5.1 mmol/L 3.8 4.5 3.0(L)  Chloride 98 - 111 mmol/L 96(L) 98 92(L)  CO2 22 - 32 mmol/L 27 27 31   Calcium 8.9 - 10.3 mg/dL 8.6(L) 8.3(L) 7.9(L)  Total Protein 6.5 - 8.1 g/dL 6.2(L) - 5.8(L)  Total Bilirubin 0.3 - 1.2 mg/dL 0.7 - 0.6  Alkaline Phos 38 - 126 U/L 47 - 43  AST 15 - 41 U/L 12(L) - 13(L)  ALT 0 - 44 U/L 16 - 20     Radiology Studies: No results found.   Scheduled Meds:  apixaban  2.5 mg Oral BID   carvedilol  6.25 mg Oral BID WC   conjugated estrogens  1 Applicatorful Vaginal QHS   furosemide  20 mg Oral QPM   furosemide  40 mg Oral q AM   insulin aspart  0-9 Units Subcutaneous TID AC & HS   pantoprazole  40 mg Oral Daily   potassium chloride  40 mEq Oral Daily   sacubitril-valsartan  1 tablet Oral BID   sodium chloride flush  3 mL Intravenous Q12H   sodium chloride  1 g Oral TID WC   sucralfate  1 g Oral BID WC   Continuous Infusions:   LOS: 8 days   Time spent: 30  Nita Sells, MD [facility] To contact the attending provider between 7A-7P or the covering provider during after hours 7P-7A, please log into the web site [email or web address] and access using universal [place] password for that web site. If you do not have the password, please call the hospital operator.  09/14/2021, 9:34 AM

## 2021-09-15 DIAGNOSIS — E871 Hypo-osmolality and hyponatremia: Secondary | ICD-10-CM | POA: Diagnosis not present

## 2021-09-15 DIAGNOSIS — R2689 Other abnormalities of gait and mobility: Secondary | ICD-10-CM | POA: Diagnosis not present

## 2021-09-15 DIAGNOSIS — E119 Type 2 diabetes mellitus without complications: Secondary | ICD-10-CM | POA: Diagnosis not present

## 2021-09-15 DIAGNOSIS — I480 Paroxysmal atrial fibrillation: Secondary | ICD-10-CM | POA: Diagnosis not present

## 2021-09-15 DIAGNOSIS — Z86010 Personal history of colonic polyps: Secondary | ICD-10-CM | POA: Diagnosis not present

## 2021-09-15 DIAGNOSIS — I951 Orthostatic hypotension: Secondary | ICD-10-CM | POA: Diagnosis not present

## 2021-09-15 DIAGNOSIS — M6281 Muscle weakness (generalized): Secondary | ICD-10-CM | POA: Diagnosis not present

## 2021-09-15 DIAGNOSIS — F4321 Adjustment disorder with depressed mood: Secondary | ICD-10-CM | POA: Diagnosis not present

## 2021-09-15 DIAGNOSIS — R531 Weakness: Secondary | ICD-10-CM | POA: Diagnosis not present

## 2021-09-15 DIAGNOSIS — F32A Depression, unspecified: Secondary | ICD-10-CM | POA: Diagnosis not present

## 2021-09-15 DIAGNOSIS — I429 Cardiomyopathy, unspecified: Secondary | ICD-10-CM | POA: Diagnosis not present

## 2021-09-15 DIAGNOSIS — I5041 Acute combined systolic (congestive) and diastolic (congestive) heart failure: Secondary | ICD-10-CM | POA: Diagnosis not present

## 2021-09-15 DIAGNOSIS — D500 Iron deficiency anemia secondary to blood loss (chronic): Secondary | ICD-10-CM | POA: Diagnosis not present

## 2021-09-15 DIAGNOSIS — R498 Other voice and resonance disorders: Secondary | ICD-10-CM | POA: Diagnosis not present

## 2021-09-15 DIAGNOSIS — I509 Heart failure, unspecified: Secondary | ICD-10-CM | POA: Diagnosis not present

## 2021-09-15 DIAGNOSIS — K2960 Other gastritis without bleeding: Secondary | ICD-10-CM | POA: Diagnosis not present

## 2021-09-15 DIAGNOSIS — I4891 Unspecified atrial fibrillation: Secondary | ICD-10-CM | POA: Diagnosis not present

## 2021-09-15 DIAGNOSIS — Z7901 Long term (current) use of anticoagulants: Secondary | ICD-10-CM | POA: Diagnosis not present

## 2021-09-15 DIAGNOSIS — Z7401 Bed confinement status: Secondary | ICD-10-CM | POA: Diagnosis not present

## 2021-09-15 DIAGNOSIS — D649 Anemia, unspecified: Secondary | ICD-10-CM | POA: Diagnosis not present

## 2021-09-15 DIAGNOSIS — R338 Other retention of urine: Secondary | ICD-10-CM | POA: Diagnosis not present

## 2021-09-15 DIAGNOSIS — J9601 Acute respiratory failure with hypoxia: Secondary | ICD-10-CM | POA: Diagnosis not present

## 2021-09-15 DIAGNOSIS — Z8719 Personal history of other diseases of the digestive system: Secondary | ICD-10-CM | POA: Diagnosis not present

## 2021-09-15 DIAGNOSIS — R0602 Shortness of breath: Secondary | ICD-10-CM | POA: Diagnosis not present

## 2021-09-15 DIAGNOSIS — Z66 Do not resuscitate: Secondary | ICD-10-CM | POA: Diagnosis not present

## 2021-09-15 DIAGNOSIS — D696 Thrombocytopenia, unspecified: Secondary | ICD-10-CM | POA: Diagnosis not present

## 2021-09-15 DIAGNOSIS — R339 Retention of urine, unspecified: Secondary | ICD-10-CM | POA: Diagnosis not present

## 2021-09-15 DIAGNOSIS — R41841 Cognitive communication deficit: Secondary | ICD-10-CM | POA: Diagnosis not present

## 2021-09-15 DIAGNOSIS — I10 Essential (primary) hypertension: Secondary | ICD-10-CM | POA: Diagnosis not present

## 2021-09-15 DIAGNOSIS — Z8673 Personal history of transient ischemic attack (TIA), and cerebral infarction without residual deficits: Secondary | ICD-10-CM | POA: Diagnosis not present

## 2021-09-15 DIAGNOSIS — R262 Difficulty in walking, not elsewhere classified: Secondary | ICD-10-CM | POA: Diagnosis not present

## 2021-09-15 DIAGNOSIS — J9611 Chronic respiratory failure with hypoxia: Secondary | ICD-10-CM | POA: Diagnosis not present

## 2021-09-15 DIAGNOSIS — I5023 Acute on chronic systolic (congestive) heart failure: Secondary | ICD-10-CM | POA: Diagnosis not present

## 2021-09-15 DIAGNOSIS — G9341 Metabolic encephalopathy: Secondary | ICD-10-CM | POA: Diagnosis not present

## 2021-09-15 DIAGNOSIS — R1312 Dysphagia, oropharyngeal phase: Secondary | ICD-10-CM | POA: Diagnosis not present

## 2021-09-15 DIAGNOSIS — Z8669 Personal history of other diseases of the nervous system and sense organs: Secondary | ICD-10-CM | POA: Diagnosis not present

## 2021-09-15 LAB — RESP PANEL BY RT-PCR (FLU A&B, COVID) ARPGX2
Influenza A by PCR: NEGATIVE
Influenza B by PCR: NEGATIVE
SARS Coronavirus 2 by RT PCR: NEGATIVE

## 2021-09-15 LAB — SARS CORONAVIRUS 2 (TAT 6-24 HRS): SARS Coronavirus 2: NEGATIVE

## 2021-09-15 LAB — GLUCOSE, CAPILLARY
Glucose-Capillary: 110 mg/dL — ABNORMAL HIGH (ref 70–99)
Glucose-Capillary: 114 mg/dL — ABNORMAL HIGH (ref 70–99)
Glucose-Capillary: 100 mg/dL — ABNORMAL HIGH (ref 70–99)

## 2021-09-15 MED ORDER — CHLORHEXIDINE GLUCONATE CLOTH 2 % EX PADS
6.0000 | MEDICATED_PAD | Freq: Every day | CUTANEOUS | Status: DC
  Administered 2021-09-15: 6 via TOPICAL

## 2021-09-15 MED ORDER — CARVEDILOL 3.125 MG PO TABS: 12.5000 mg | ORAL_TABLET | Freq: Two times a day (BID) | ORAL | Status: DC

## 2021-09-15 MED ORDER — SODIUM CHLORIDE 1 G PO TABS: 1.0000 g | ORAL_TABLET | Freq: Three times a day (TID) | ORAL | Status: AC

## 2021-09-15 MED ORDER — CARVEDILOL 3.125 MG PO TABS
3.1250 mg | ORAL_TABLET | Freq: Two times a day (BID) | ORAL | Status: DC
  Administered 2021-09-15 (×2): 3.125 mg via ORAL
  Filled 2021-09-15 (×2): qty 1

## 2021-09-15 MED ORDER — FUROSEMIDE 40 MG PO TABS: 40.0000 mg | ORAL_TABLET | Freq: Every morning | ORAL | Status: DC

## 2021-09-15 MED ORDER — POTASSIUM CHLORIDE CRYS ER 20 MEQ PO TBCR: 40.0000 meq | EXTENDED_RELEASE_TABLET | Freq: Every day | ORAL | Status: AC

## 2021-09-15 NOTE — TOC Progression Note (Addendum)
Transition of Care [facility]) - Progression Note    Patient Details  Name: Helen [name] MRN: [id number] Date of Birth: 06/11/1930  Transition of Care [facility]) CM/SW Contact  Drayden Lukas, Juliann Pulse, RN Phone Number: 09/15/2021, 12:04 PM  Clinical Narrative:  Bed offers given to dtr [name]-await choice.  -12/55p-ellen dtr chose [place]-await if [place] can accept.d/c order in place.  1. 1.3 mi [place] [facility] [place], [place] [address] [phone] Overall rating Above average 2. 1.6 mi [place] [facility] [place], [place] [address] [phone] Overall rating Much below average 3. 2.1 mi [place], [place] [address] [phone] Overall rating Much below average 4. 2.5 mi [facility] [place], [place] [address] [phone] Overall rating Below average 5. 2.8 mi [facility] at the [place], [place] [address] [phone] Overall rating Average 6. 2.8 mi [place] [address] [place], [place] [address] [phone] Overall rating Much below average 7. 3 mi [facility] [place], [place] [address] [phone] Overall rating Above average 8. 3.6 [place] [address] [place], [place] [address] [phone] Overall rating Average 9. 3.6 mi [facility] [address] [place], [place] [address] [phone] Overall rating Much below average 10. 3.9 mi [facility] [place], [place] [address] [phone] Overall rating Much below average 11. 4.4 mi [facility] at [place], [place] [address] [phone] Overall rating Much  above average 12. 4.6 mi [facility] [address] [place], [place] [address] [phone] Overall rating Much above average 13. 5.5 mi [facility] [address] [place], [place] [address] [phone] Overall rating Above average 14. 8.2 [facility] [place], [place] [address] [phone] Overall rating Much above average 15. 9 mi [facility] [address] [place], [place] [address] [phone] Overall rating Below average 16. 9.1 [place] and [place], [place] [address] [phone] Overall rating Much below average 17. 9.2 mi [facility] [address] [place], [place] [address] [phone] Overall rating Much above average 18. 10.8 mi [facility] [address] [place], [place] [address] [phone] Overall rating Much above average 19. 12.6 mi [facility] [address] [place], [place] [address] [phone] Overall rating Much below average 20. 12.8 [facility] [place], [place] [address] [phone] Overall rating Much below average 21. 14.2 mi [facility] [place] [facility] [address] [place], [place] [address] [phone] Overall rating Below average 22. 14.4 mi [facility] [place], [place] [address] [phone] Overall rating Above average 23. 14.8 mi [place] [facility] [place], [place] [address] [phone] Overall rating Much above average 24. 14.9 [place] [address] [place], [place] [address] [phone] Overall rating Much below average 25. 16.5 mi [facility] [address] [place],  [address] [phone] Overall  rating Average 26. 16.7 [place], [place]  [address] [phone] Overall rating Above average 27. 17.9 mi [place], [place] [address] [phone] Overall rating Average 28. 68.1 [facility] [address] [place], [place] [address] [phone] Overall rating Much below average 29. 19.7 mi [facility] [address] [place], [place] [address] [phone] Overall rating Much below average 30. 20 mi [address] at [employer] at [facility], [place] [address] [phone] Overall rating Much above average 31. 21.1 mi [facility] [place], [place] [address] [phone] Overall rating Much below average 32. 21.6 [address] [place], [place] [address] [phone] Overall rating Below average 33. 16.5 [facility] [address] [place], [place] [address] [phone] Overall rating Below average 34. 21.8 [place], [place] [address] [phone] Overall rating Above average 35. [address] [place], [place] [address] [phone] Overall rating Much above average 36. 22.6 mi [facility] [address] [place], [place] [address] [phone] Overall rating Average 37. 22.7 mi [facility] [address] [place], [place] [address] [phone] Overall rating Much below average 38. 23.3 mi [facility] [place] [address] [place], [place] [address] [phone] Overall rating Above average 39. 23.5 [place], [place] [address] [phone] Overall rating Not available18 40. 24.1 mi [place] [facility]  [place] [address] [place], [place] [address] [phone] Overall rating Much below average 41. 24.2 mi [facility] [place] [facility] [address] [place], [place] [address] [phone] Overall rating Below average 42. 24.4 [facility]/[place], [place] [address] [phone] Overall rating Much below average 43. 24.5 mi [facility] [place], [place] [address] [phone] Overall rating Above average To explore and download nu    Barriers to Discharge: Continued Medical Work up  Expected Discharge Plan and Services                                                 Social Determinants of Health (SDOH) Interventions    Readmission Risk Interventions Readmission Risk Prevention Plan 09/12/2021 08/22/2021 08/21/2021  Transportation Screening Complete Complete Complete  PCP or Specialist Appt within 5-7 Days - - Complete  PCP or Specialist Appt within 3-5 Days Complete Complete -  Home Care Screening - - Complete  Medication Review (RN CM) - - Complete  HRI or Home Care Consult Complete Complete -  Social Work Consult for [place] Planning/Counseling Complete Complete -  Palliative Care Screening Complete Complete -  Medication Review Press photographer) Complete Complete -  Some recent data might be hidden

## 2021-09-15 NOTE — Discharge Summary (Signed)
Physician Discharge Summary  Helen [name] DXI:[id number] DOB: 02-26-1930 DOA: 09/05/2021  PCP: Vernie Shanks, MD  Admit date: 09/05/2021 Discharge date: 09/15/2021  Time spent: 44 minutes  Recommendations for Outpatient Follow-up:  Needs cbc bmet 1 week Needs follow up with PCP post SNF d/c See med changes on [facility] Suggest op Urology follow up for Voiding trial Follow up with Dr. Therisa Doyne as OP  Discharge Diagnoses:  MAIN problem for hospitalization   Toxic Metabolic encephalopathy  Please see below for itemized issues addressed in [place]- refer to other progress notes for clarity if needed  Discharge Condition: improved  Diet recommendation:  HH low salt  Filed Weights   09/11/21 0500 09/14/21 1200 09/15/21 0500  Weight: 59.1 kg 60.9 kg 61.2 kg    History of present illness:   88 year old white female Known A. fib/Eliquis Combined systolic diastolic HF, HTN, HLD, DM TY 2 Prior stroke Breast cancer   Recent admission 12/28 through 08/26/2021 hemoglobin 5.1-eventually transfused 4 units PRBC--EGD 08/22/2021 normal esophagus, gastric polyps Started on sucralfate and PPI--at that time was discharged on home oxygen   Admit 7/67/3419 metabolic encephalopathy?  Hypoxic Likely secondary to CHF exacerbation in combination with COPD exacerbation   She is currently being cautiously diuresed, and is improving for probable SNF placement 1/23  Hospital Course:   Toxic metabolic encephalopathy on admission secondary to possible hypercarbia  coherent now Acute hypoxic hypercarbic respiratory failure possibly acute CHF exacerbation [cardiomyopathy in 2018 follows with Dr. Oval Linsey Echo shows EF 45 to 50% down from prior echo  Diuresed with IV--D/c on regular dose lasix 20 od -4.4 L since admission lowered Coreg to 6.25 bid 09/12/21 cont Entresto twice daily Diamox was discontinued on 1/21 Urinary retention x 3 this admit Keep Foley on d/c--will need OP Urology  visits Paroxysmal A. fib CHADS2 score >4 on Eliquis Continue anticoagulation Continue Coreg 3.25 on d/c twice daily for rate control Hypervolemic hyponatremia 2/2 CHF Resolving slowly Hypokalemia 2/2 diuresis--adjusted K. Dur Recent GI bleed transfused 4 units-gastric polyps on exam No further current bleeding continue Protonix 40 daily-resume Carafate tabs tid Follow as OP with Dr. Therisa Doyne   Discharge Exam: Vitals:   09/15/21 1000 09/15/21 1302  BP: 102/86 (!) 103/45  Pulse: 69 (!) 107  Resp:  18  Temp:  98 F (36.7 C)  SpO2: 100% 100%    Subj on day of d/c   Awake coherent  Slightly anxious about the discharge--felt "dizzy" this am Orthostatics are neg Reassured No cp no fever Ortho statics were neg  General Exam on discharge  Eomi ncat no focal deficit Cta b no added sound no rales no rhonchi Jvd flat Abd soft nt nd no rebound no guard Neuro intact moving limbs x 4 equally  Discharge Instructions   Discharge Instructions     (HEART FAILURE PATIENTS) Call MD:  Anytime you have any of the following symptoms: 1) 3 pound weight gain in 24 hours or 5 pounds in 1 week 2) shortness of breath, with or without a dry hacking cough 3) swelling in the hands, feet or stomach 4) if you have to sleep on extra pillows at night in order to breathe.   Complete by: As directed    Diet - low sodium heart healthy   Complete by: As directed    Increase activity slowly   Complete by: As directed       Allergies as of 09/15/2021       Reactions   Morphine And  Related Nausea And Vomiting   Other Other (See Comments)   Other reaction(s): Other (See Comments) Barley & Carrots: learned through allergy testing. Unknown reaction-- MD said not very allergic but not to eat large quantities Barley & Carrots: learned through allergy testing. Unknown reaction-- MD said not very allergic but not to eat large quantities   Sulfa Antibiotics Hives   Sulfasalazine Hives   Citalopram Other  (See Comments)   QT prolongation   Nickel Rash        Medication List     STOP taking these medications    bismuth subsalicylate 505 MG chewable tablet Commonly known as: PEPTO BISMOL   ondansetron 4 MG tablet Commonly known as: ZOFRAN       TAKE these medications    atorvastatin 80 MG tablet Commonly known as: LIPITOR Take 80 mg by mouth daily.   BIOTIN PO Take 1 tablet by mouth daily.   carvedilol 3.125 MG tablet Commonly known as: COREG Take 4 tablets (12.5 mg total) by mouth 2 (two) times daily with a meal. What changed: medication strength   cholecalciferol 1000 units tablet Commonly known as: VITAMIN D Take 1,000 Units by mouth every evening.   Eliquis 5 MG Tabs tablet Generic drug: apixaban TAKE 1 TABLET BY MOUTH  TWICE DAILY What changed: how much to take   Entresto 49-51 MG Generic drug: sacubitril-valsartan TAKE 1 TABLET BY MOUTH  TWICE DAILY   furosemide 40 MG tablet Commonly known as: LASIX Take 1 tablet (40 mg total) by mouth in the morning. Start taking on: September 16, 2021 What changed:  medication strength how much to take when to take this   magnesium oxide 400 MG tablet Commonly known as: MAG-OX 1 tablet by mouth twice a day for 1 week and then 1 daily What changed:  how much to take how to take this when to take this additional instructions   multivitamin with minerals Tabs tablet Take 1 tablet by mouth daily.   pantoprazole 40 MG tablet Commonly known as: PROTONIX Take 1 tablet (40 mg total) by mouth 2 (two) times daily before a meal.   potassium chloride SA 20 MEQ tablet Commonly known as: KLOR-CON M Take 2 tablets (40 mEq total) by mouth daily. Start taking on: September 16, 2021   senna-docusate 8.6-50 MG tablet Commonly known as: Senokot-S Take 2 tablets by mouth at bedtime as needed for mild constipation or moderate constipation.   sodium chloride 1 g tablet Take 1 tablet (1 g total) by mouth 3 (three) times  daily with meals.   sucralfate 1 g tablet Commonly known as: CARAFATE Take 1 tablet (1 g total) by mouth 2 (two) times daily with a meal.   vitamin C 500 MG tablet Commonly known as: ASCORBIC ACID Take 500 mg by mouth daily.       Allergies  Allergen Reactions   Morphine And Related Nausea And Vomiting   Other Other (See Comments)    Other reaction(s): Other (See Comments) Barley & Carrots: learned through allergy testing. Unknown reaction-- MD said not very allergic but not to eat large quantities Barley & Carrots: learned through allergy testing. Unknown reaction-- MD said not very allergic but not to eat large quantities   Sulfa Antibiotics Hives   Sulfasalazine Hives   Citalopram Other (See Comments)    QT prolongation    Nickel Rash      The results of significant diagnostics from this hospitalization (including imaging, microbiology, ancillary and laboratory) are  listed below for reference.    Significant Diagnostic Studies: DG Chest 2 View  Result Date: 09/05/2021 CLINICAL DATA:  Pulmonary edema, history of tobacco abuse EXAM: CHEST - 2 VIEW COMPARISON:  08/20/2021 FINDINGS: Frontal and lateral views of the chest demonstrate an unremarkable cardiac silhouette. Chronic areas of scarring are seen throughout the right hemithorax, with distortion at the right hilum and volume loss unchanged since prior exams. There is increased central vascular congestion, with scattered ground-glass opacities consistent with mild edema. Trace right pleural effusion. No pneumothorax. IMPRESSION: 1. Mild pulmonary edema superimposed upon background scarring. Electronically Signed   By: Randa Ngo M.D.   On: 09/05/2021 17:23   CT Head Wo Contrast  Result Date: 09/05/2021 CLINICAL DATA:  Mental status changes. EXAM: CT HEAD WITHOUT CONTRAST TECHNIQUE: Contiguous axial images were obtained from the base of the skull through the vertex without intravenous contrast. RADIATION DOSE REDUCTION:  This exam was performed according to the departmental dose-optimization program which includes automated exposure control, adjustment of the mA and/or kV according to patient size and/or use of iterative reconstruction technique. COMPARISON:  11/16/2016 FINDINGS: Brain: No evidence of acute infarction, hemorrhage, extra-axial collection, ventriculomegaly, or mass effect. Generalized cerebral atrophy. Periventricular white matter low attenuation likely secondary to microangiopathy. Vascular: Cerebrovascular atherosclerotic calcifications are noted. Skull: Negative for fracture or focal lesion. Sinuses/Orbits: Visualized portions of the orbits are unremarkable. Left maxillary sinus mucosal thickening. Mild sphenoid sinus mucosal thickening. Visualized portions of the mastoid air cells are unremarkable. Other: None. IMPRESSION: 1. No acute intracranial pathology. 2. Chronic microvascular disease and cerebral atrophy. Electronically Signed   By: Kathreen Devoid M.D.   On: 09/05/2021 17:12   CT Head Wo Contrast  Result Date: 08/20/2021 CLINICAL DATA:  Dizziness and confusion EXAM: CT HEAD WITHOUT CONTRAST TECHNIQUE: Contiguous axial images were obtained from the base of the skull through the vertex without intravenous contrast. COMPARISON:  CT head dated November 16, 2016 FINDINGS: Brain: Chronic white matter ischemic change. No evidence of acute infarction, hemorrhage, hydrocephalus, extra-axial collection or mass lesion/mass effect. Vascular: No hyperdense vessel or unexpected calcification. Skull: Normal. Negative for fracture or focal lesion. Sinuses/Orbits: Mucosal thickening of the right maxillary and ethmoid sinuses. Other: None. IMPRESSION: No acute intracranial abnormality. Electronically Signed   By: Yetta Glassman M.D.   On: 08/20/2021 14:47   US RENAL  Result Date: 09/07/2021 CLINICAL DATA:  Renal lithiasis. EXAM: RENAL / URINARY TRACT ULTRASOUND COMPLETE COMPARISON:  November 11, 2016 FINDINGS: Right  Kidney: Renal measurements: 9.6 x 5.5 x 4.6 cm = volume: 129 mL. Echogenicity within normal limits. No mass or hydronephrosis visualized. Small amount of perinephric fluid. Left Kidney: Renal measurements: 9.2 x 4.6 x 4.0 cm = volume: 89.2 mL. Echogenicity within normal limits. No mass or hydronephrosis visualized. Small amount of perinephric fluid noted. Bladder: Appears normal for degree of bladder distention. Bilateral ureteral jets seen. Hyperechoic nonshadowing mass in the midpole region of the left kidney measures 0.8 x 0.8 x 0.6 cm. Other: None. IMPRESSION: Small amount of perinephric fluid noted bilaterally. No evidence of hydronephrosis. 8 mm hyperechoic mass in the mid cortex of the left kidney may represent nonshadowing calculus or fat containing benign mass. Electronically Signed   By: Fidela Salisbury M.D.   On: 09/07/2021 19:25   DG Chest Port 1 View  Result Date: 08/20/2021 CLINICAL DATA:  Weakness, cough, dizziness and confusion. EXAM: PORTABLE CHEST 1 VIEW COMPARISON:  01/03/2021 FINDINGS: Chronic peripheral fibrosis especially at the lung bases and in  the right upper lobe, with associated bandlike scarring in the right mid lung which is chronically stable. The patient is rotated to the right on today's radiograph, reducing diagnostic sensitivity and specificity. Chronic indistinctness of the right hemidiaphragm. The patient has known pleural calcifications on the right side. Atherosclerotic calcification of the aortic arch. Heart size within normal limits. IMPRESSION: 1. Stable chronic opacities along the right hemidiaphragm and in the right mid lung, much of which is likely from scarring. There is chronic reticular interstitial accentuation in the lungs as well. Portions of the right hemidiaphragm are obscured and strictly speaking, right lower lobe pneumonia is not readily excluded. 2.  Aortic Atherosclerosis (ICD10-I70.0). 3. No acute findings or progressive opacities. Electronically  Signed   By: Van Clines M.D.   On: 08/20/2021 14:24   ECHOCARDIOGRAM COMPLETE  Result Date: 09/06/2021    ECHOCARDIOGRAM REPORT   Patient Name:   MARSHALL ROEHRICH [facility] Date of Exam: 09/06/2021 Medical Rec #:  [id number]        Height:       64.5 in Accession #:    [id number]       Weight:       143.0 lb Date of Birth:  1930-04-26        BSA:          1.705 m Patient Age:    38 years         BP:           125/110 mmHg Patient Gender: F                HR:           89 bpm. Exam Location:  Inpatient Procedure: 2D Echo Indications:    Dyspnea  History:        Patient has prior history of Echocardiogram examinations, most                 recent 05/13/2021. CHF, Stroke; Risk Factors:Hypertension.  Sonographer:    Arlyss Gandy Referring Phys: [id number]  1. Left ventricular ejection fraction, by estimation, is 45 to 50%. The left ventricle has mildly decreased function. The left ventricle demonstrates global hypokinesis. Diastolic function indeterminant due to severe MAC. Elevated left atrial pressure.  2. Right ventricular systolic function mildly-to-moderately reduced. The right ventricular size is mildly-to-moderately enlarged. While RV is incompletely visualized, there appears to be relative preservation of RV apical function compared to RV free wall which can be seen in acute PE. Recommend clinical correlation.  3. Left atrial size was moderately dilated.  4. The mitral valve is degenerative. There is moderate mitral valve regurgitation. Severe mitral annular calcification. Suspect at least mild calcific mitral stenosis with mean gradient 85mHg at HR 98bpm. Unable to calculate MVA as suboptimal LVOT VTI signal and patient terminated TTE early.  5. The aortic valve is not well visualized on SAX view, however, on PLAX view it appears to be heavily calcified and thickened with severe leaflet restriction. Mean gradient 633mg, peak 1.15m70m. Due to incomplete LVOT VTI signal, unable to  assess AVA and DI and patient ended exam early. Suspect there at least mild vs moderate aortic valve stenosis. There is trivial aortic regurgitation.  6. IVC was not interrogated Comparison(s): Compared to prior TTE in 04/2021, the RV on limited views appears more dilated and hypokinetic. There is also now moderate mitral regurgitation which was previously mild. There continues to be at least mild aortic stenosis. EF appears slightly worse  at 45-50%. FINDINGS  Left Ventricle: Left ventricular ejection fraction, by estimation, is 45 to 50%. The left ventricle has mildly decreased function. The left ventricle demonstrates global hypokinesis. The left ventricular internal cavity size was normal in size. There is  no left ventricular hypertrophy. Diastolic function indeterminant due to severe MAC. Elevated left atrial pressure. Right Ventricle: The right ventricular size is mildly-to-moderately enlarged. Right vetricular wall thickness was not well visualized. Right ventricular systolic function mildly-to-moderately reduced. Left Atrium: Left atrial size was moderately dilated. Right Atrium: Right atrial size was not well visualized. Pericardium: There is no evidence of pericardial effusion. Mitral Valve: The mitral valve is degenerative in appearance. There is moderate thickening of the mitral valve leaflet(s). There is moderate calcification of the mitral valve leaflet(s). Severe mitral annular calcification. Moderate mitral valve regurgitation. MV peak gradient, 11.2 mmHg. The mean mitral valve gradient is 4.5 mmHg. Tricuspid Valve: The tricuspid valve is normal in structure. Tricuspid valve regurgitation is trivial. Aortic Valve: The aortic valve was not well visualized. Aortic valve regurgitation is trivial. Aortic valve mean gradient measures 6.0 mmHg. Aortic valve peak gradient measures 11.4 mmHg. Aortic valve area, by VTI measures 0.74 cm. Pulmonic Valve: The pulmonic valve was normal in structure. Pulmonic  valve regurgitation is trivial. Aorta: The aortic root and ascending aorta are structurally normal, with no evidence of dilitation. IAS/Shunts: The atrial septum is grossly normal.  LEFT VENTRICLE PLAX 2D LVIDd:         3.60 cm   Diastology LVIDs:         2.90 cm   LV e' medial:    3.81 cm/s LV PW:         0.80 cm   LV E/e' medial:  34.1 LV IVS:        0.80 cm   LV e' lateral:   5.77 cm/s LVOT diam:     1.80 cm   LV E/e' lateral: 22.5 LV SV:         28 LV SV Index:   16 LVOT Area:     2.54 cm  RIGHT VENTRICLE TAPSE (M-mode): 1.5 cm AORTIC VALVE AV Area (Vmax):    0.90 cm AV Area (Vmean):   0.89 cm AV Area (VTI):     0.74 cm AV Vmax:           169.00 cm/s AV Vmean:          114.000 cm/s AV VTI:            0.376 m AV Peak Grad:      11.4 mmHg AV Mean Grad:      6.0 mmHg LVOT Vmax:         60.00 cm/s LVOT Vmean:        39.800 cm/s LVOT VTI:          0.109 m LVOT/AV VTI ratio: 0.29  AORTA Ao Root diam: 2.40 cm Ao Asc diam:  2.90 cm MITRAL VALVE MV Area (PHT): 5.31 cm     SHUNTS MV Area VTI:   0.71 cm     Systemic VTI:  0.11 m MV Peak grad:  11.2 mmHg    Systemic Diam: 1.80 cm MV Mean grad:  4.5 mmHg MV Vmax:       1.67 m/s MV Vmean:      98.1 cm/s MV Decel Time: 143 msec MV E velocity: 130.00 cm/s MV A velocity: 75.10 cm/s MV E/A ratio:  1.73 Gwyndolyn Kaufman MD Electronically signed by Gwyndolyn Kaufman  MD Signature Date/Time: 09/06/2021/1:56:52 PM    Final     Microbiology: Recent Results (from the past 240 hour(s))  Resp Panel by RT-PCR (Flu A&B, Covid) Nasopharyngeal Swab     Status: None   Collection Time: 09/05/21  4:34 PM   Specimen: Nasopharyngeal Swab; Nasopharyngeal(NP) swabs in vial transport medium  Result Value Ref Range Status   SARS Coronavirus 2 by RT PCR NEGATIVE NEGATIVE Final    Comment: (NOTE) SARS-CoV-2 target nucleic acids are NOT DETECTED.  The SARS-CoV-2 RNA is generally detectable in upper respiratory specimens during the acute phase of infection. The lowest concentration of  SARS-CoV-2 viral copies this assay can detect is 138 copies/mL. A negative result does not preclude SARS-Cov-2 infection and should not be used as the sole basis for treatment or other patient management decisions. A negative result may occur with  improper specimen collection/handling, submission of specimen other than nasopharyngeal swab, presence of viral mutation(s) within the areas targeted by this assay, and inadequate number of viral copies(<138 copies/mL). A negative result must be combined with clinical observations, patient history, and epidemiological information. The expected result is Negative.  Fact Sheet for Patients:  EntrepreneurPulse.com.au  Fact Sheet for Healthcare Providers:  IncredibleEmployment.be  This test is no t yet approved or cleared by the [place] FDA and  has been authorized for detection and/or diagnosis of SARS-CoV-2 by FDA under an Emergency Use Authorization (EUA). This EUA will remain  in effect (meaning this test can be used) for the duration of the COVID-19 declaration under Section 564(b)(1) of the Act, 21 U.S.C.section 360bbb-3(b)(1), unless the authorization is terminated  or revoked sooner.       Influenza A by PCR NEGATIVE NEGATIVE Final   Influenza B by PCR NEGATIVE NEGATIVE Final    Comment: (NOTE) The Xpert Xpress SARS-CoV-2/FLU/RSV plus assay is intended as an aid in the diagnosis of influenza from Nasopharyngeal swab specimens and should not be used as a sole basis for treatment. Nasal washings and aspirates are unacceptable for Xpert Xpress SARS-CoV-2/FLU/RSV testing.  Fact Sheet for Patients: EntrepreneurPulse.com.au  Fact Sheet for Healthcare Providers: IncredibleEmployment.be  This test is not yet approved or cleared by the [place] FDA and has been authorized for detection and/or diagnosis of SARS-CoV-2 by FDA under an Emergency Use  Authorization (EUA). This EUA will remain in effect (meaning this test can be used) for the duration of the COVID-19 declaration under Section 564(b)(1) of the Act, 21 U.S.C. section 360bbb-3(b)(1), unless the authorization is terminated or revoked.  Performed at [facility], [place] [address] [place], [place] [address]      Labs: Basic Metabolic Panel: Recent Labs  Lab 09/10/21 0424 09/11/21 0417 09/12/21 0420 09/13/21 0440 09/14/21 0828  NA 128* 132* 131* 131* 132*  K 3.4* 2.8* 3.0* 4.5 3.8  CL 84* 88* 92* 98 96*  CO2 35* 34* _0 GLUCOSE 112* 100* 97 95 110*  BUN 27* 27* 30* 24* 18  CREATININE 1.25* 1.04* 1.23* 0.98 0.90  CALCIUM 8.3* 8.2* 7.9* 8.3* 8.6*  MG  --  1.9 1.9  --   --    Liver Function Tests: Recent Labs  Lab 09/12/21 0420 09/14/21 0828  AST 13* 12*  ALT 20 16  ALKPHOS 43 47  BILITOT 0.6 0.7  PROT 5.8* 6.2*  ALBUMIN 2.8* 2.8*   No results for input(s): LIPASE, AMYLASE in the last 168 hours. No results for input(s): AMMONIA in the last 168 hours. CBC:  Recent Labs  Lab 09/11/21 0417 09/12/21 0420 09/12/21 2236  WBC 9.8 9.3 9.3  NEUTROABS 7.6 7.0  --   HGB 8.6* 8.3* 8.4*  HCT 27.1* 26.8* 28.9*  MCV 88.0 87.9 94.8  PLT 122* 117* 127*   Cardiac Enzymes: No results for input(s): CKTOTAL, CKMB, CKMBINDEX, TROPONINI in the last 168 hours. BNP: BNP (last 3 results) Recent Labs    01/09/21 1428 09/05/21 1644 09/07/21 1017  BNP 896.1* 3,071.6* 854.6*    ProBNP (last 3 results) No results for input(s): PROBNP in the last 8760 hours.  CBG: Recent Labs  Lab 09/14/21 1149 09/14/21 1645 09/14/21 2112 09/15/21 0739 09/15/21 1136  GLUCAP 112* 110* 117* 110* 114*       Signed:  Nita Sells MD   [facility] 09/15/2021, 1:19 PM

## 2021-09-15 NOTE — Progress Notes (Signed)
[facility] called. Report called to [facility]. IV removed. Tele removed. Pt dressed in personal clothing. Awaiting [facility] for transport to facility.

## 2021-09-15 NOTE — Progress Notes (Signed)
Physical Therapy Treatment Patient Details Name: Helen [name] MRN: [id number] DOB: 1930-06-08 Today's Date: 09/15/2021   History of Present Illness Patient is a 88 year old female  who presented to the hosptial from rehab with hypoxia and confusion. patient was found to have CHF exacerbation, and acute respiratory failure with hypoxia and hypercabnia, COPD. PMH: anemia, a fib, CHF, diabetes, PVD, breast cancer.    PT Comments    Pt participated fairly well on today. She continues to report having issues with dizziness. Cues for pt to move slowly and to sit a bit before standing. Overall, Min A for mobility on today. She remains weak and at risk for falls when mobilizing. Continue to recommend return to SNF rehab.     Recommendations for follow up therapy are one component of a multi-disciplinary discharge planning process, led by the attending physician.  Recommendations may be updated based on patient status, additional functional criteria and insurance authorization.  Follow Up Recommendations  Skilled nursing-short term rehab (<3 hours/day)     Assistance Recommended at Discharge Frequent or constant Supervision/Assistance  Patient can return home with the following A little help with bathing/dressing/bathroom;A little help with walking and/or transfers;Assistance with cooking/housework;Direct supervision/assist for medications management;Help with stairs or ramp for entrance   Equipment Recommendations  None recommended by PT    Recommendations for Other Services       Precautions / Restrictions Precautions Precautions: Fall Precaution Comments: dizziness Restrictions Weight Bearing Restrictions: No     Mobility  Bed Mobility Overal bed mobility: Needs Assistance Bed Mobility: Supine to Sit     Supine to sit: Min assist     General bed mobility comments: Assist for trunk. Increased time. Cues provided. Sat EOB for several minutes 2* dizziness.     Transfers Overall transfer level: Needs assistance Equipment used: Rolling walker (2 wheels) Transfers: Sit to/from Stand Sit to Stand: Min assist           General transfer comment: Assist to power up, stabilize, control descent. Cues for safety, technique, hand placement    Ambulation/Gait Min Assist  Gait Distance (Feet): 3 Feet Assistive device: Rolling walker (2 wheels)         General Gait Details: Pt took a few steps in room with RW. Steadying assistance provided. Cues for safety.    Stairs             Wheelchair Mobility    Modified Rankin (Stroke Patients Only)       Balance Overall balance assessment: Needs assistance         Standing balance support: Bilateral upper extremity supported, Reliant on assistive device for balance, During functional activity Standing balance-Leahy Scale: Poor                              Cognition Arousal/Alertness: Awake/alert Behavior During Therapy: WFL for tasks assessed/performed Overall Cognitive Status: No family/caregiver present to determine baseline cognitive functioning                                          Exercises      General Comments        Pertinent Vitals/Pain Pain Assessment Pain Assessment: Faces Faces Pain Scale: No hurt    Home Living  Prior Function            PT Goals (current goals can now be found in the care plan section) Progress towards PT goals: Progressing toward goals    Frequency    Min 3X/week      PT Plan Current plan remains appropriate    Co-evaluation              AM-PAC PT "6 Clicks" Mobility   Outcome Measure  Help needed turning from your back to your side while in a flat bed without using bedrails?: A Little Help needed moving from lying on your back to sitting on the side of a flat bed without using bedrails?: A Little Help needed moving to and from a bed to a chair  (including a wheelchair)?: A Little Help needed standing up from a chair using your arms (e.g., wheelchair or bedside chair)?: A Little Help needed to walk in hospital room?: A Lot Help needed climbing 3-5 steps with a railing? : Total 6 Click Score: 15    End of Session Equipment Utilized During Treatment: Gait belt Activity Tolerance: Patient limited by fatigue Patient left: in chair;with call bell/phone within reach;with chair alarm set   PT Visit Diagnosis: Muscle weakness (generalized) (M62.81);Difficulty in walking, not elsewhere classified (R26.2)     Time: 1130-1145 PT Time Calculation (min) (ACUTE ONLY): 15 min  Charges:  $Gait Training: 8-22 mins                        Doreatha Massed, PT Acute Rehabilitation  Office: [phone] Pager: [phone]

## 2021-09-15 NOTE — Progress Notes (Signed)
Chaplain was able to get in touch with a Carlos from [facility] who will be able to see Reata this afternoon.  Idelle Crouch will have me paged if there are any other needs.  Chaplain is available to provide support.     09/15/21 0900  Clinical Encounter Type  Visited With Patient not available  Visit Type Follow-up;Spiritual support  Referral From Patient  Consult/Referral To Chaplain  Spiritual Encounters  Spiritual Needs Sacred text;Ritual;Prayer

## 2021-09-15 NOTE — TOC Transition Note (Addendum)
Transition of Care [facility]) - CM/SW Discharge Note   Patient Details  Name: Helen [name] MRN: [id number] Date of Birth: 1930/01/03  [facility] CM/SW Contact:  Dessa Phi, RN Phone Number: 09/15/2021, 1:24 PM   Clinical Narrative:  d/c today [facility] able to accept. Await rm#,tel# for report [phone].PTAR to be called once rm# given.Await covid results. No further CM needs. -2:30p-going t rm#[id number].Will call pTAR once covid results back between CM/NSG. No further CM needs.       Barriers to Discharge: No Barriers Identified   Patient Goals and CMS Choice        Discharge Placement              Patient chooses bed at: [facility] Patient to be transferred to facility by: [facility] Name of family member notified: Dorian Pod dtr [phone] Patient and family notified of of transfer: 09/15/21  Discharge Plan and Services                                     Social Determinants of Health (SDOH) Interventions     Readmission Risk Interventions Readmission Risk Prevention Plan 09/12/2021 08/22/2021 08/21/2021  Transportation Screening Complete Complete Complete  PCP or Specialist Appt within 5-7 Days - - Complete  PCP or Specialist Appt within 3-5 Days Complete Complete -  Home Care Screening - - Complete  Medication Review (RN CM) - - Complete  HRI or Home Care Consult Complete Complete -  Social Work Consult for [place] Planning/Counseling Complete Complete -  Palliative Care Screening Complete Complete -  Medication Review Press photographer) Complete Complete -  Some recent data might be hidden

## 2021-09-16 DIAGNOSIS — Z8669 Personal history of other diseases of the nervous system and sense organs: Secondary | ICD-10-CM | POA: Diagnosis not present

## 2021-09-16 DIAGNOSIS — I480 Paroxysmal atrial fibrillation: Secondary | ICD-10-CM | POA: Diagnosis not present

## 2021-09-16 DIAGNOSIS — R339 Retention of urine, unspecified: Secondary | ICD-10-CM | POA: Diagnosis not present

## 2021-09-16 DIAGNOSIS — E871 Hypo-osmolality and hyponatremia: Secondary | ICD-10-CM | POA: Diagnosis not present

## 2021-09-16 DIAGNOSIS — Z66 Do not resuscitate: Secondary | ICD-10-CM | POA: Diagnosis not present

## 2021-09-16 DIAGNOSIS — D649 Anemia, unspecified: Secondary | ICD-10-CM | POA: Diagnosis not present

## 2021-09-16 DIAGNOSIS — J9611 Chronic respiratory failure with hypoxia: Secondary | ICD-10-CM | POA: Diagnosis not present

## 2021-09-16 DIAGNOSIS — Z8719 Personal history of other diseases of the digestive system: Secondary | ICD-10-CM | POA: Diagnosis not present

## 2021-09-16 DIAGNOSIS — D696 Thrombocytopenia, unspecified: Secondary | ICD-10-CM | POA: Diagnosis not present

## 2021-09-16 DIAGNOSIS — I429 Cardiomyopathy, unspecified: Secondary | ICD-10-CM | POA: Diagnosis not present

## 2021-09-16 DIAGNOSIS — I951 Orthostatic hypotension: Secondary | ICD-10-CM | POA: Diagnosis not present

## 2021-09-16 DIAGNOSIS — I5041 Acute combined systolic (congestive) and diastolic (congestive) heart failure: Secondary | ICD-10-CM | POA: Diagnosis not present

## 2021-09-17 DIAGNOSIS — R339 Retention of urine, unspecified: Secondary | ICD-10-CM | POA: Diagnosis not present

## 2021-09-17 DIAGNOSIS — M6281 Muscle weakness (generalized): Secondary | ICD-10-CM | POA: Diagnosis not present

## 2021-09-17 DIAGNOSIS — I509 Heart failure, unspecified: Secondary | ICD-10-CM | POA: Diagnosis not present

## 2021-09-17 DIAGNOSIS — I4891 Unspecified atrial fibrillation: Secondary | ICD-10-CM | POA: Diagnosis not present

## 2021-09-17 DIAGNOSIS — G9341 Metabolic encephalopathy: Secondary | ICD-10-CM | POA: Diagnosis not present

## 2021-09-17 DIAGNOSIS — I10 Essential (primary) hypertension: Secondary | ICD-10-CM | POA: Diagnosis not present

## 2021-09-17 DIAGNOSIS — D649 Anemia, unspecified: Secondary | ICD-10-CM | POA: Diagnosis not present

## 2021-09-17 DIAGNOSIS — Z8673 Personal history of transient ischemic attack (TIA), and cerebral infarction without residual deficits: Secondary | ICD-10-CM | POA: Diagnosis not present

## 2021-09-17 DIAGNOSIS — E119 Type 2 diabetes mellitus without complications: Secondary | ICD-10-CM | POA: Diagnosis not present

## 2021-09-17 DIAGNOSIS — J9601 Acute respiratory failure with hypoxia: Secondary | ICD-10-CM | POA: Diagnosis not present

## 2021-09-18 ENCOUNTER — Other Ambulatory Visit: Payer: Self-pay | Admitting: *Deleted

## 2021-09-18 NOTE — Patient Outreach (Signed)
[facility] Post- Acute Care Coordinator follow up. Per [place] eligible member resides in [facility].  Screened for potential [facility] Care Management services as a benefit of member's insurance plan.  Facility site visit to [place] skilled nursing facility. Met with Helen [name] concerning member's progress, transition plan, and potential [facility] needs. Anticipated transition plan is ALF.  There are no identifiable [facility] Care Management needs at this time. Asked SNF SW to alert writer if transition plans change.   Helen Rolling, MSN, RN,BSN [place] Acute Care Coordinator [phone] [facility]) [phone]  (Toll free office)

## 2021-09-22 DIAGNOSIS — R339 Retention of urine, unspecified: Secondary | ICD-10-CM | POA: Diagnosis not present

## 2021-09-22 DIAGNOSIS — J9601 Acute respiratory failure with hypoxia: Secondary | ICD-10-CM | POA: Diagnosis not present

## 2021-09-22 DIAGNOSIS — I10 Essential (primary) hypertension: Secondary | ICD-10-CM | POA: Diagnosis not present

## 2021-09-22 DIAGNOSIS — G9341 Metabolic encephalopathy: Secondary | ICD-10-CM | POA: Diagnosis not present

## 2021-09-22 DIAGNOSIS — F4321 Adjustment disorder with depressed mood: Secondary | ICD-10-CM | POA: Diagnosis not present

## 2021-09-22 DIAGNOSIS — E119 Type 2 diabetes mellitus without complications: Secondary | ICD-10-CM | POA: Diagnosis not present

## 2021-09-22 DIAGNOSIS — I509 Heart failure, unspecified: Secondary | ICD-10-CM | POA: Diagnosis not present

## 2021-09-22 DIAGNOSIS — D649 Anemia, unspecified: Secondary | ICD-10-CM | POA: Diagnosis not present

## 2021-09-22 DIAGNOSIS — Z8673 Personal history of transient ischemic attack (TIA), and cerebral infarction without residual deficits: Secondary | ICD-10-CM | POA: Diagnosis not present

## 2021-09-22 DIAGNOSIS — I4891 Unspecified atrial fibrillation: Secondary | ICD-10-CM | POA: Diagnosis not present

## 2021-09-22 DIAGNOSIS — M6281 Muscle weakness (generalized): Secondary | ICD-10-CM | POA: Diagnosis not present

## 2021-09-23 DIAGNOSIS — J9611 Chronic respiratory failure with hypoxia: Secondary | ICD-10-CM | POA: Diagnosis not present

## 2021-09-23 DIAGNOSIS — I951 Orthostatic hypotension: Secondary | ICD-10-CM | POA: Diagnosis not present

## 2021-09-23 DIAGNOSIS — I5041 Acute combined systolic (congestive) and diastolic (congestive) heart failure: Secondary | ICD-10-CM | POA: Diagnosis not present

## 2021-09-23 DIAGNOSIS — D500 Iron deficiency anemia secondary to blood loss (chronic): Secondary | ICD-10-CM | POA: Diagnosis not present

## 2021-09-23 DIAGNOSIS — E871 Hypo-osmolality and hyponatremia: Secondary | ICD-10-CM | POA: Diagnosis not present

## 2021-09-23 DIAGNOSIS — Z8719 Personal history of other diseases of the digestive system: Secondary | ICD-10-CM | POA: Diagnosis not present

## 2021-09-24 DIAGNOSIS — G9341 Metabolic encephalopathy: Secondary | ICD-10-CM | POA: Diagnosis not present

## 2021-09-24 DIAGNOSIS — I4891 Unspecified atrial fibrillation: Secondary | ICD-10-CM | POA: Diagnosis not present

## 2021-09-24 DIAGNOSIS — I10 Essential (primary) hypertension: Secondary | ICD-10-CM | POA: Diagnosis not present

## 2021-09-24 DIAGNOSIS — E119 Type 2 diabetes mellitus without complications: Secondary | ICD-10-CM | POA: Diagnosis not present

## 2021-09-24 DIAGNOSIS — D649 Anemia, unspecified: Secondary | ICD-10-CM | POA: Diagnosis not present

## 2021-09-24 DIAGNOSIS — Z8673 Personal history of transient ischemic attack (TIA), and cerebral infarction without residual deficits: Secondary | ICD-10-CM | POA: Diagnosis not present

## 2021-09-24 DIAGNOSIS — M6281 Muscle weakness (generalized): Secondary | ICD-10-CM | POA: Diagnosis not present

## 2021-09-24 DIAGNOSIS — J9601 Acute respiratory failure with hypoxia: Secondary | ICD-10-CM | POA: Diagnosis not present

## 2021-09-24 DIAGNOSIS — R339 Retention of urine, unspecified: Secondary | ICD-10-CM | POA: Diagnosis not present

## 2021-09-24 DIAGNOSIS — I509 Heart failure, unspecified: Secondary | ICD-10-CM | POA: Diagnosis not present

## 2021-09-29 DIAGNOSIS — G9341 Metabolic encephalopathy: Secondary | ICD-10-CM | POA: Diagnosis not present

## 2021-09-29 DIAGNOSIS — J9601 Acute respiratory failure with hypoxia: Secondary | ICD-10-CM | POA: Diagnosis not present

## 2021-09-29 DIAGNOSIS — M6281 Muscle weakness (generalized): Secondary | ICD-10-CM | POA: Diagnosis not present

## 2021-09-29 DIAGNOSIS — I10 Essential (primary) hypertension: Secondary | ICD-10-CM | POA: Diagnosis not present

## 2021-09-29 DIAGNOSIS — D649 Anemia, unspecified: Secondary | ICD-10-CM | POA: Diagnosis not present

## 2021-09-29 DIAGNOSIS — Z8673 Personal history of transient ischemic attack (TIA), and cerebral infarction without residual deficits: Secondary | ICD-10-CM | POA: Diagnosis not present

## 2021-09-29 DIAGNOSIS — I4891 Unspecified atrial fibrillation: Secondary | ICD-10-CM | POA: Diagnosis not present

## 2021-09-29 DIAGNOSIS — E119 Type 2 diabetes mellitus without complications: Secondary | ICD-10-CM | POA: Diagnosis not present

## 2021-09-29 DIAGNOSIS — I509 Heart failure, unspecified: Secondary | ICD-10-CM | POA: Diagnosis not present

## 2021-09-29 DIAGNOSIS — R339 Retention of urine, unspecified: Secondary | ICD-10-CM | POA: Diagnosis not present

## 2021-10-01 DIAGNOSIS — I10 Essential (primary) hypertension: Secondary | ICD-10-CM | POA: Diagnosis not present

## 2021-10-01 DIAGNOSIS — I4891 Unspecified atrial fibrillation: Secondary | ICD-10-CM | POA: Diagnosis not present

## 2021-10-01 DIAGNOSIS — E119 Type 2 diabetes mellitus without complications: Secondary | ICD-10-CM | POA: Diagnosis not present

## 2021-10-01 DIAGNOSIS — J9601 Acute respiratory failure with hypoxia: Secondary | ICD-10-CM | POA: Diagnosis not present

## 2021-10-01 DIAGNOSIS — I509 Heart failure, unspecified: Secondary | ICD-10-CM | POA: Diagnosis not present

## 2021-10-01 DIAGNOSIS — G9341 Metabolic encephalopathy: Secondary | ICD-10-CM | POA: Diagnosis not present

## 2021-10-01 DIAGNOSIS — M6281 Muscle weakness (generalized): Secondary | ICD-10-CM | POA: Diagnosis not present

## 2021-10-01 DIAGNOSIS — Z8673 Personal history of transient ischemic attack (TIA), and cerebral infarction without residual deficits: Secondary | ICD-10-CM | POA: Diagnosis not present

## 2021-10-01 DIAGNOSIS — D649 Anemia, unspecified: Secondary | ICD-10-CM | POA: Diagnosis not present

## 2021-10-01 DIAGNOSIS — R339 Retention of urine, unspecified: Secondary | ICD-10-CM | POA: Diagnosis not present

## 2021-10-03 ENCOUNTER — Encounter (HOSPITAL_BASED_OUTPATIENT_CLINIC_OR_DEPARTMENT_OTHER): Payer: Self-pay

## 2021-10-03 ENCOUNTER — Telehealth: Payer: Self-pay | Admitting: Cardiovascular Disease

## 2021-10-06 DIAGNOSIS — M6281 Muscle weakness (generalized): Secondary | ICD-10-CM | POA: Diagnosis not present

## 2021-10-06 DIAGNOSIS — D649 Anemia, unspecified: Secondary | ICD-10-CM | POA: Diagnosis not present

## 2021-10-06 DIAGNOSIS — Z8673 Personal history of transient ischemic attack (TIA), and cerebral infarction without residual deficits: Secondary | ICD-10-CM | POA: Diagnosis not present

## 2021-10-06 DIAGNOSIS — I509 Heart failure, unspecified: Secondary | ICD-10-CM | POA: Diagnosis not present

## 2021-10-06 DIAGNOSIS — E119 Type 2 diabetes mellitus without complications: Secondary | ICD-10-CM | POA: Diagnosis not present

## 2021-10-06 DIAGNOSIS — G9341 Metabolic encephalopathy: Secondary | ICD-10-CM | POA: Diagnosis not present

## 2021-10-06 DIAGNOSIS — R339 Retention of urine, unspecified: Secondary | ICD-10-CM | POA: Diagnosis not present

## 2021-10-06 DIAGNOSIS — I4891 Unspecified atrial fibrillation: Secondary | ICD-10-CM | POA: Diagnosis not present

## 2021-10-06 DIAGNOSIS — I10 Essential (primary) hypertension: Secondary | ICD-10-CM | POA: Diagnosis not present

## 2021-10-06 DIAGNOSIS — J9601 Acute respiratory failure with hypoxia: Secondary | ICD-10-CM | POA: Diagnosis not present

## 2021-10-07 DIAGNOSIS — I5041 Acute combined systolic (congestive) and diastolic (congestive) heart failure: Secondary | ICD-10-CM | POA: Diagnosis not present

## 2021-10-07 DIAGNOSIS — D500 Iron deficiency anemia secondary to blood loss (chronic): Secondary | ICD-10-CM | POA: Diagnosis not present

## 2021-10-07 DIAGNOSIS — R339 Retention of urine, unspecified: Secondary | ICD-10-CM | POA: Diagnosis not present

## 2021-10-07 DIAGNOSIS — E871 Hypo-osmolality and hyponatremia: Secondary | ICD-10-CM | POA: Diagnosis not present

## 2021-10-07 DIAGNOSIS — Z8719 Personal history of other diseases of the digestive system: Secondary | ICD-10-CM | POA: Diagnosis not present

## 2021-10-08 DIAGNOSIS — I509 Heart failure, unspecified: Secondary | ICD-10-CM | POA: Diagnosis not present

## 2021-10-08 DIAGNOSIS — I4891 Unspecified atrial fibrillation: Secondary | ICD-10-CM | POA: Diagnosis not present

## 2021-10-08 DIAGNOSIS — E119 Type 2 diabetes mellitus without complications: Secondary | ICD-10-CM | POA: Diagnosis not present

## 2021-10-08 DIAGNOSIS — D649 Anemia, unspecified: Secondary | ICD-10-CM | POA: Diagnosis not present

## 2021-10-08 DIAGNOSIS — R339 Retention of urine, unspecified: Secondary | ICD-10-CM | POA: Diagnosis not present

## 2021-10-08 DIAGNOSIS — M6281 Muscle weakness (generalized): Secondary | ICD-10-CM | POA: Diagnosis not present

## 2021-10-08 DIAGNOSIS — J9601 Acute respiratory failure with hypoxia: Secondary | ICD-10-CM | POA: Diagnosis not present

## 2021-10-08 DIAGNOSIS — G9341 Metabolic encephalopathy: Secondary | ICD-10-CM | POA: Diagnosis not present

## 2021-10-08 DIAGNOSIS — Z8673 Personal history of transient ischemic attack (TIA), and cerebral infarction without residual deficits: Secondary | ICD-10-CM | POA: Diagnosis not present

## 2021-10-08 DIAGNOSIS — I10 Essential (primary) hypertension: Secondary | ICD-10-CM | POA: Diagnosis not present

## 2021-10-13 DIAGNOSIS — Z8673 Personal history of transient ischemic attack (TIA), and cerebral infarction without residual deficits: Secondary | ICD-10-CM | POA: Diagnosis not present

## 2021-10-13 DIAGNOSIS — I509 Heart failure, unspecified: Secondary | ICD-10-CM | POA: Diagnosis not present

## 2021-10-13 DIAGNOSIS — I4891 Unspecified atrial fibrillation: Secondary | ICD-10-CM | POA: Diagnosis not present

## 2021-10-13 DIAGNOSIS — I10 Essential (primary) hypertension: Secondary | ICD-10-CM | POA: Diagnosis not present

## 2021-10-13 DIAGNOSIS — J9601 Acute respiratory failure with hypoxia: Secondary | ICD-10-CM | POA: Diagnosis not present

## 2021-10-13 DIAGNOSIS — G9341 Metabolic encephalopathy: Secondary | ICD-10-CM | POA: Diagnosis not present

## 2021-10-13 DIAGNOSIS — D649 Anemia, unspecified: Secondary | ICD-10-CM | POA: Diagnosis not present

## 2021-10-13 DIAGNOSIS — R339 Retention of urine, unspecified: Secondary | ICD-10-CM | POA: Diagnosis not present

## 2021-10-13 DIAGNOSIS — E119 Type 2 diabetes mellitus without complications: Secondary | ICD-10-CM | POA: Diagnosis not present

## 2021-10-13 DIAGNOSIS — M6281 Muscle weakness (generalized): Secondary | ICD-10-CM | POA: Diagnosis not present

## 2021-10-15 DIAGNOSIS — I509 Heart failure, unspecified: Secondary | ICD-10-CM | POA: Diagnosis not present

## 2021-10-15 DIAGNOSIS — Z8673 Personal history of transient ischemic attack (TIA), and cerebral infarction without residual deficits: Secondary | ICD-10-CM | POA: Diagnosis not present

## 2021-10-15 DIAGNOSIS — R339 Retention of urine, unspecified: Secondary | ICD-10-CM | POA: Diagnosis not present

## 2021-10-15 DIAGNOSIS — I4891 Unspecified atrial fibrillation: Secondary | ICD-10-CM | POA: Diagnosis not present

## 2021-10-15 DIAGNOSIS — J9601 Acute respiratory failure with hypoxia: Secondary | ICD-10-CM | POA: Diagnosis not present

## 2021-10-15 DIAGNOSIS — D649 Anemia, unspecified: Secondary | ICD-10-CM | POA: Diagnosis not present

## 2021-10-15 DIAGNOSIS — I10 Essential (primary) hypertension: Secondary | ICD-10-CM | POA: Diagnosis not present

## 2021-10-15 DIAGNOSIS — M6281 Muscle weakness (generalized): Secondary | ICD-10-CM | POA: Diagnosis not present

## 2021-10-15 DIAGNOSIS — E119 Type 2 diabetes mellitus without complications: Secondary | ICD-10-CM | POA: Diagnosis not present

## 2021-10-15 DIAGNOSIS — G9341 Metabolic encephalopathy: Secondary | ICD-10-CM | POA: Diagnosis not present

## 2021-10-16 ENCOUNTER — Telehealth (HOSPITAL_BASED_OUTPATIENT_CLINIC_OR_DEPARTMENT_OTHER): Payer: Self-pay | Admitting: *Deleted

## 2021-10-16 NOTE — Telephone Encounter (Signed)
-----   Message from Skeet Latch, MD sent at 10/03/2021  1:04 PM EST ----- Recommend repeating lipids.  Last labs were 10/2020.  Recommend adding Zetia if still elevated because she has moderate carotid disease and her LDL needs to be <70.  It was 90 on her last labs

## 2021-10-16 NOTE — Telephone Encounter (Signed)
Tried to call patient, number not working  Will try again

## 2021-10-17 DIAGNOSIS — R338 Other retention of urine: Secondary | ICD-10-CM | POA: Diagnosis not present

## 2021-10-20 DIAGNOSIS — E119 Type 2 diabetes mellitus without complications: Secondary | ICD-10-CM | POA: Diagnosis not present

## 2021-10-20 DIAGNOSIS — I4891 Unspecified atrial fibrillation: Secondary | ICD-10-CM | POA: Diagnosis not present

## 2021-10-20 DIAGNOSIS — M6281 Muscle weakness (generalized): Secondary | ICD-10-CM | POA: Diagnosis not present

## 2021-10-20 DIAGNOSIS — J9601 Acute respiratory failure with hypoxia: Secondary | ICD-10-CM | POA: Diagnosis not present

## 2021-10-20 DIAGNOSIS — I10 Essential (primary) hypertension: Secondary | ICD-10-CM | POA: Diagnosis not present

## 2021-10-20 DIAGNOSIS — D649 Anemia, unspecified: Secondary | ICD-10-CM | POA: Diagnosis not present

## 2021-10-20 DIAGNOSIS — I509 Heart failure, unspecified: Secondary | ICD-10-CM | POA: Diagnosis not present

## 2021-10-20 DIAGNOSIS — Z8673 Personal history of transient ischemic attack (TIA), and cerebral infarction without residual deficits: Secondary | ICD-10-CM | POA: Diagnosis not present

## 2021-10-20 DIAGNOSIS — R339 Retention of urine, unspecified: Secondary | ICD-10-CM | POA: Diagnosis not present

## 2021-10-20 DIAGNOSIS — G9341 Metabolic encephalopathy: Secondary | ICD-10-CM | POA: Diagnosis not present

## 2021-10-21 DIAGNOSIS — F32A Depression, unspecified: Secondary | ICD-10-CM | POA: Diagnosis not present

## 2021-10-21 DIAGNOSIS — I10 Essential (primary) hypertension: Secondary | ICD-10-CM | POA: Diagnosis not present

## 2021-10-21 DIAGNOSIS — I480 Paroxysmal atrial fibrillation: Secondary | ICD-10-CM | POA: Diagnosis not present

## 2021-10-21 DIAGNOSIS — I509 Heart failure, unspecified: Secondary | ICD-10-CM | POA: Diagnosis not present

## 2021-10-22 DIAGNOSIS — R339 Retention of urine, unspecified: Secondary | ICD-10-CM | POA: Diagnosis not present

## 2021-10-22 DIAGNOSIS — J9601 Acute respiratory failure with hypoxia: Secondary | ICD-10-CM | POA: Diagnosis not present

## 2021-10-22 DIAGNOSIS — I10 Essential (primary) hypertension: Secondary | ICD-10-CM | POA: Diagnosis not present

## 2021-10-22 DIAGNOSIS — I509 Heart failure, unspecified: Secondary | ICD-10-CM | POA: Diagnosis not present

## 2021-10-22 DIAGNOSIS — I4891 Unspecified atrial fibrillation: Secondary | ICD-10-CM | POA: Diagnosis not present

## 2021-10-22 DIAGNOSIS — G9341 Metabolic encephalopathy: Secondary | ICD-10-CM | POA: Diagnosis not present

## 2021-10-22 DIAGNOSIS — D649 Anemia, unspecified: Secondary | ICD-10-CM | POA: Diagnosis not present

## 2021-10-22 DIAGNOSIS — M6281 Muscle weakness (generalized): Secondary | ICD-10-CM | POA: Diagnosis not present

## 2021-10-22 DIAGNOSIS — Z8673 Personal history of transient ischemic attack (TIA), and cerebral infarction without residual deficits: Secondary | ICD-10-CM | POA: Diagnosis not present

## 2021-10-22 DIAGNOSIS — E119 Type 2 diabetes mellitus without complications: Secondary | ICD-10-CM | POA: Diagnosis not present

## 2021-10-24 DIAGNOSIS — D649 Anemia, unspecified: Secondary | ICD-10-CM | POA: Diagnosis not present

## 2021-10-24 DIAGNOSIS — Z7901 Long term (current) use of anticoagulants: Secondary | ICD-10-CM | POA: Diagnosis not present

## 2021-10-24 DIAGNOSIS — K2960 Other gastritis without bleeding: Secondary | ICD-10-CM | POA: Diagnosis not present

## 2021-10-24 DIAGNOSIS — Z86010 Personal history of colonic polyps: Secondary | ICD-10-CM | POA: Diagnosis not present

## 2021-10-25 DIAGNOSIS — R0602 Shortness of breath: Secondary | ICD-10-CM | POA: Diagnosis not present

## 2021-10-27 DIAGNOSIS — D500 Iron deficiency anemia secondary to blood loss (chronic): Secondary | ICD-10-CM | POA: Diagnosis not present

## 2021-10-27 DIAGNOSIS — I509 Heart failure, unspecified: Secondary | ICD-10-CM | POA: Diagnosis not present

## 2021-10-27 DIAGNOSIS — I429 Cardiomyopathy, unspecified: Secondary | ICD-10-CM | POA: Diagnosis not present

## 2021-10-27 DIAGNOSIS — Z8719 Personal history of other diseases of the digestive system: Secondary | ICD-10-CM | POA: Diagnosis not present

## 2021-10-27 DIAGNOSIS — J9611 Chronic respiratory failure with hypoxia: Secondary | ICD-10-CM | POA: Diagnosis not present

## 2021-10-27 DIAGNOSIS — E871 Hypo-osmolality and hyponatremia: Secondary | ICD-10-CM | POA: Diagnosis not present

## 2021-10-27 DIAGNOSIS — I480 Paroxysmal atrial fibrillation: Secondary | ICD-10-CM | POA: Diagnosis not present

## 2021-10-30 DIAGNOSIS — I5041 Acute combined systolic (congestive) and diastolic (congestive) heart failure: Secondary | ICD-10-CM | POA: Diagnosis not present

## 2021-10-30 DIAGNOSIS — Z8673 Personal history of transient ischemic attack (TIA), and cerebral infarction without residual deficits: Secondary | ICD-10-CM | POA: Diagnosis not present

## 2021-10-30 DIAGNOSIS — I480 Paroxysmal atrial fibrillation: Secondary | ICD-10-CM | POA: Diagnosis not present

## 2021-10-30 DIAGNOSIS — I110 Hypertensive heart disease with heart failure: Secondary | ICD-10-CM | POA: Diagnosis not present

## 2021-10-30 DIAGNOSIS — F419 Anxiety disorder, unspecified: Secondary | ICD-10-CM | POA: Diagnosis not present

## 2021-10-30 DIAGNOSIS — Z87891 Personal history of nicotine dependence: Secondary | ICD-10-CM | POA: Diagnosis not present

## 2021-10-30 DIAGNOSIS — D696 Thrombocytopenia, unspecified: Secondary | ICD-10-CM | POA: Diagnosis not present

## 2021-10-30 DIAGNOSIS — I951 Orthostatic hypotension: Secondary | ICD-10-CM | POA: Diagnosis not present

## 2021-10-30 DIAGNOSIS — Z466 Encounter for fitting and adjustment of urinary device: Secondary | ICD-10-CM | POA: Diagnosis not present

## 2021-10-30 DIAGNOSIS — Z7901 Long term (current) use of anticoagulants: Secondary | ICD-10-CM | POA: Diagnosis not present

## 2021-10-30 DIAGNOSIS — E785 Hyperlipidemia, unspecified: Secondary | ICD-10-CM | POA: Diagnosis not present

## 2021-10-30 DIAGNOSIS — I739 Peripheral vascular disease, unspecified: Secondary | ICD-10-CM | POA: Diagnosis not present

## 2021-10-30 DIAGNOSIS — R32 Unspecified urinary incontinence: Secondary | ICD-10-CM | POA: Diagnosis not present

## 2021-10-30 DIAGNOSIS — Z853 Personal history of malignant neoplasm of breast: Secondary | ICD-10-CM | POA: Diagnosis not present

## 2021-10-30 DIAGNOSIS — J9611 Chronic respiratory failure with hypoxia: Secondary | ICD-10-CM | POA: Diagnosis not present

## 2021-10-30 DIAGNOSIS — F32A Depression, unspecified: Secondary | ICD-10-CM | POA: Diagnosis not present

## 2021-10-30 DIAGNOSIS — D500 Iron deficiency anemia secondary to blood loss (chronic): Secondary | ICD-10-CM | POA: Diagnosis not present

## 2021-10-30 DIAGNOSIS — R339 Retention of urine, unspecified: Secondary | ICD-10-CM | POA: Diagnosis not present

## 2021-10-30 DIAGNOSIS — I429 Cardiomyopathy, unspecified: Secondary | ICD-10-CM | POA: Diagnosis not present

## 2021-10-30 DIAGNOSIS — E871 Hypo-osmolality and hyponatremia: Secondary | ICD-10-CM | POA: Diagnosis not present

## 2021-10-30 DIAGNOSIS — G928 Other toxic encephalopathy: Secondary | ICD-10-CM | POA: Diagnosis not present

## 2021-11-04 DIAGNOSIS — E1122 Type 2 diabetes mellitus with diabetic chronic kidney disease: Secondary | ICD-10-CM | POA: Diagnosis not present

## 2021-11-04 DIAGNOSIS — F331 Major depressive disorder, recurrent, moderate: Secondary | ICD-10-CM | POA: Diagnosis not present

## 2021-11-04 DIAGNOSIS — I509 Heart failure, unspecified: Secondary | ICD-10-CM | POA: Diagnosis not present

## 2021-11-04 DIAGNOSIS — I10 Essential (primary) hypertension: Secondary | ICD-10-CM | POA: Diagnosis not present

## 2021-11-04 DIAGNOSIS — Z9981 Dependence on supplemental oxygen: Secondary | ICD-10-CM | POA: Diagnosis not present

## 2021-11-04 DIAGNOSIS — Z86010 Personal history of colonic polyps: Secondary | ICD-10-CM | POA: Diagnosis not present

## 2021-11-04 DIAGNOSIS — Z7901 Long term (current) use of anticoagulants: Secondary | ICD-10-CM | POA: Diagnosis not present

## 2021-11-04 DIAGNOSIS — Z8673 Personal history of transient ischemic attack (TIA), and cerebral infarction without residual deficits: Secondary | ICD-10-CM | POA: Diagnosis not present

## 2021-11-04 DIAGNOSIS — J9610 Chronic respiratory failure, unspecified whether with hypoxia or hypercapnia: Secondary | ICD-10-CM | POA: Diagnosis not present

## 2021-11-04 DIAGNOSIS — R946 Abnormal results of thyroid function studies: Secondary | ICD-10-CM | POA: Diagnosis not present

## 2021-11-04 DIAGNOSIS — K2960 Other gastritis without bleeding: Secondary | ICD-10-CM | POA: Diagnosis not present

## 2021-11-04 DIAGNOSIS — D649 Anemia, unspecified: Secondary | ICD-10-CM | POA: Diagnosis not present

## 2021-11-05 ENCOUNTER — Telehealth (HOSPITAL_BASED_OUTPATIENT_CLINIC_OR_DEPARTMENT_OTHER): Payer: Self-pay | Admitting: Cardiovascular Disease

## 2021-11-05 DIAGNOSIS — I5041 Acute combined systolic (congestive) and diastolic (congestive) heart failure: Secondary | ICD-10-CM | POA: Diagnosis not present

## 2021-11-05 DIAGNOSIS — G928 Other toxic encephalopathy: Secondary | ICD-10-CM | POA: Diagnosis not present

## 2021-11-05 DIAGNOSIS — I110 Hypertensive heart disease with heart failure: Secondary | ICD-10-CM | POA: Diagnosis not present

## 2021-11-05 DIAGNOSIS — D500 Iron deficiency anemia secondary to blood loss (chronic): Secondary | ICD-10-CM | POA: Diagnosis not present

## 2021-11-05 DIAGNOSIS — I480 Paroxysmal atrial fibrillation: Secondary | ICD-10-CM | POA: Diagnosis not present

## 2021-11-05 DIAGNOSIS — J9611 Chronic respiratory failure with hypoxia: Secondary | ICD-10-CM | POA: Diagnosis not present

## 2021-11-05 NOTE — Telephone Encounter (Signed)
Daughter of patient called. The patient's Assisted Living facility is not able to bring the patient to her appointment at her current appointment time. The daughter wanted to know if there was a way that the patient's appointment could be adjusted to accommodate the transportation service at the Assisted Living facility. Please call the daughter  ?

## 2021-11-05 NOTE — Telephone Encounter (Signed)
Hey, I tried to look at [place]'s schedule and I am not seeing anything, I know she has seen Denyse Amass before if we need to get her on with him! Thanks!  ?

## 2021-11-07 DIAGNOSIS — I480 Paroxysmal atrial fibrillation: Secondary | ICD-10-CM | POA: Diagnosis not present

## 2021-11-07 DIAGNOSIS — I110 Hypertensive heart disease with heart failure: Secondary | ICD-10-CM | POA: Diagnosis not present

## 2021-11-07 DIAGNOSIS — I5041 Acute combined systolic (congestive) and diastolic (congestive) heart failure: Secondary | ICD-10-CM | POA: Diagnosis not present

## 2021-11-07 DIAGNOSIS — D500 Iron deficiency anemia secondary to blood loss (chronic): Secondary | ICD-10-CM | POA: Diagnosis not present

## 2021-11-07 DIAGNOSIS — J9611 Chronic respiratory failure with hypoxia: Secondary | ICD-10-CM | POA: Diagnosis not present

## 2021-11-07 DIAGNOSIS — G928 Other toxic encephalopathy: Secondary | ICD-10-CM | POA: Diagnosis not present

## 2021-11-07 NOTE — Telephone Encounter (Signed)
Advised daughter and will discuss further at visit 3/21 ?

## 2021-11-07 NOTE — Telephone Encounter (Signed)
Spoke with daughter and she rescheduled for am appointment next week  ?

## 2021-11-07 NOTE — Telephone Encounter (Signed)
error 

## 2021-11-11 ENCOUNTER — Other Ambulatory Visit: Payer: Self-pay

## 2021-11-11 ENCOUNTER — Ambulatory Visit (HOSPITAL_BASED_OUTPATIENT_CLINIC_OR_DEPARTMENT_OTHER): Payer: Medicare Other | Admitting: Family

## 2021-11-11 ENCOUNTER — Ambulatory Visit (INDEPENDENT_AMBULATORY_CARE_PROVIDER_SITE_OTHER): Payer: Medicare Other | Admitting: Family

## 2021-11-11 ENCOUNTER — Ambulatory Visit (HOSPITAL_BASED_OUTPATIENT_CLINIC_OR_DEPARTMENT_OTHER): Payer: Medicare Other | Admitting: Cardiovascular Disease

## 2021-11-11 VITALS — BP 100/56 | HR 94 | Ht 64.5 in | Wt 143.6 lb

## 2021-11-11 DIAGNOSIS — E785 Hyperlipidemia, unspecified: Secondary | ICD-10-CM | POA: Diagnosis not present

## 2021-11-11 DIAGNOSIS — I480 Paroxysmal atrial fibrillation: Secondary | ICD-10-CM

## 2021-11-11 DIAGNOSIS — I428 Other cardiomyopathies: Secondary | ICD-10-CM

## 2021-11-11 DIAGNOSIS — I5042 Chronic combined systolic (congestive) and diastolic (congestive) heart failure: Secondary | ICD-10-CM

## 2021-11-11 DIAGNOSIS — I952 Hypotension due to drugs: Secondary | ICD-10-CM

## 2021-11-11 DIAGNOSIS — I779 Disorder of arteries and arterioles, unspecified: Secondary | ICD-10-CM

## 2021-11-11 DIAGNOSIS — D6859 Other primary thrombophilia: Secondary | ICD-10-CM

## 2021-11-11 MED ORDER — ENTRESTO 24-26 MG PO TABS: 1.0000 | ORAL_TABLET | Freq: Two times a day (BID) | ORAL | 3 refills | Status: AC

## 2021-11-11 MED ORDER — APIXABAN 5 MG PO TABS: 5.0000 mg | ORAL_TABLET | Freq: Two times a day (BID) | ORAL | 3 refills | Status: AC

## 2021-11-11 NOTE — Patient Instructions (Addendum)
Medication Instructions:  ?Your physician has recommended you make the following change in your medication:  ? ?REDUCE Entresto to 24-'26mg'$  twice daily ? ?INCREASE Apixaban (Eliquis) to '5mg'$  twice daily ? ?*If you need a refill on your cardiac medications before your next appointment, please call your pharmacy* ? ? ?Lab Work: ?None ordered today. Recommend a fasting lipid panel or non-fasting direct LDL with your next labs.  ? ? ?Testing/Procedures: ?None ordered today. Your next carotid duplex is not due until September and we can discuss scheduling at follow up.  ? ? ?Follow-Up: ?At [facility], you and your health needs are our priority.  As part of our continuing mission to provide you with exceptional heart care, we have created designated Provider Care Teams.  These Care Teams include your primary Cardiologist (physician) and Advanced Practice Providers (APPs -  Physician Assistants and Nurse Practitioners) who all work together to provide you with the care you need, when you need it. ? ?We recommend signing up for the patient portal called "MyChart".  Sign up information is provided on this After Visit Summary.  MyChart is used to connect with patients for Virtual Visits (Telemedicine).  Patients are able to view lab/test results, encounter notes, upcoming appointments, etc.  Non-urgent messages can be sent to your provider as well.   ?To learn more about what you can do with MyChart, go to [email or web address].   ? ?Your next appointment:   ?2-3 month(s) ? ?The format for your next appointment:   ?In Person ? ?Provider:   ?Skeet Latch, MD or Loel Dubonnet, NP   ? ? ?Other Instructions ? ?For [name]: ?Please drink a protein drink or Ensure in the morning. ?Please participate with physical therapy. ?Please increase your fluid and food intake. This will help to prevent low blood pressure.   ? ?For [place]: ?Check blood pressure once per day for one week. If blood pressure consistently  <110/60 or >140/90, please contact cardiology. Thereafter, check BP three times per week on Mondays, Wednesdays, Fridays when doing weight.  ?Check weight three times per week. Contact provider for weight gain of 3 pounds over 2 days or 5 pounds in one week. ?

## 2021-11-11 NOTE — Progress Notes (Signed)
? ?Office Visit  ?  ?Patient Name: Helen [name] ?Date of Encounter: 11/12/2021 ? ?PCP:  Vernie Shanks, MD ?  ?[place]  ?Cardiologist:  Skeet Latch, MD  ?Advanced Practice Provider:  No care team member to display ?Electrophysiologist:  None  ?   ? ?Chief Complaint  ?  ?Helen [name] is a 88 y.o. female with a hx of combined systolic and diastolic heart failure, carotid artery disease, paroxysmal atrial fibrillation, hypertension, hyperlipidemia, diabetes, prior CVA, CAD, breast cancer s/p chemotherapy with double mastectomy presents today for heart failure follow-up ? ?Past Medical History  ?  ?Past Medical History:  ?Diagnosis Date  ? Anemia   ? Anxiety   ? Cancer [facility])   ? Breast cancer  ? Chronic combined systolic (congestive) and diastolic (congestive) heart failure (HCC)   ? a. EF 25-30% by echo in 11/2016. Recent NST showing no inducible ischemia --> therefore thought to be most consistent with nonischemic cardiomyopathy.   ? Depression   ? Diabetes mellitus   ? History of stroke 11/2016  ? incidental finding on MRI  ? Hypercholesteremia   ? Hypertension   ? Peripheral vascular disease ([place]) 2015  ? bilateral moderate CA disease-Dr Bridgett Larsson  ? Stroke [facility])   ? ?Past Surgical History:  ?Procedure Laterality Date  ? ABDOMINAL HYSTERECTOMY    ? APPENDECTOMY  1951  ? BIOPSY  12/13/2019  ? Procedure: BIOPSY;  Surgeon: Wilford Corner, MD;  Location: [facility];  Service: Endoscopy;;  ? [place]  ? CHOLECYSTECTOMY  2003  ? By Dr. Excell Seltzer  ? COLONOSCOPY WITH PROPOFOL N/A 12/13/2019  ? Procedure: COLONOSCOPY WITH PROPOFOL;  Surgeon: Wilford Corner, MD;  Location: [facility];  Service: Endoscopy;  Laterality: N/A;  ? ESOPHAGOGASTRODUODENOSCOPY N/A 12/13/2019  ? Procedure: ESOPHAGOGASTRODUODENOSCOPY (EGD);  Surgeon: Wilford Corner, MD;  Location: [facility];  Service: Endoscopy;  Laterality: N/A;  ? ESOPHAGOGASTRODUODENOSCOPY (EGD) WITH PROPOFOL N/A  08/22/2021  ? Procedure: ESOPHAGOGASTRODUODENOSCOPY (EGD) WITH PROPOFOL;  Surgeon: Ronnette Juniper, MD;  Location: [facility];  Service: Gastroenterology;  Laterality: N/A;  ? EYE SURGERY Bilateral 2009  ? Cataract surgery  ? FRACTURE SURGERY Right   ? ORIF   by Dr. Burney Gauze  ? HUMERUS FRACTURE SURGERY    ? MASTECTOMY Bilateral   ? NASAL SEPTUM SURGERY  1977  ? Dr. Ernesto Rutherford  ? POLYPECTOMY  12/13/2019  ? Procedure: POLYPECTOMY;  Surgeon: Wilford Corner, MD;  Location: [facility];  Service: Endoscopy;;  ? TOENAIL EXCISION    ? ? ?Allergies ? ?Allergies  ?Allergen Reactions  ? Morphine And Related Nausea And Vomiting  ? Other Other (See Comments)  ?  Other reaction(s): Other (See Comments) ?Barley & Carrots: learned through allergy testing. Unknown reaction-- MD said not very allergic but not to eat large quantities ?Barley & Carrots: learned through allergy testing. Unknown reaction-- MD said not very allergic but not to eat large quantities  ? Sulfa Antibiotics Hives  ? Sulfasalazine Hives  ? Citalopram Other (See Comments)  ?  QT prolongation ?  ? Nickel Rash  ? ? ?History of Present Illness  ?  ?Helen [name] is a 88 y.o. female with a hx of combined systolic and diastolic heart failure, carotid artery disease, paroxysmal atrial fibrillation, hypertension, hyperlipidemia, diabetes, prior CVA, CAD, breast cancer s/p chemotherapy with double mastectomy last seen 04/2021. ? ?Prior echocardiogram 2018 with LVEF 25 to 70%.  Repeat echo performed 04/2021 showed improvement in LVEF to  50 to 55%.  Last seen in clinic 05/19/2021 by Coletta Memos, NP doing overall well from cardiac perspective with occasional episodes of dizziness and encouraged to increase her p.o. hydration. ? ?Admission 07/2021 with symptomatic anemia requiring 4 units of PRBC.  EGD 08/22/2021 with normal esophagus, multiple gastric polyps, normal duodenum, erythematous mucosa in the stomach.  Eliquis was able to be resumed. ? ?Admitted 09/05/2021 -  0/78/6754 with metabolic encephalopathy secondary to possible hypercarbia.  Treated for CHF exacerbation with diuresis of -4.4 L.  Echo with LVEF reduced compared to previous 45 to 50%.  Carvedilol was reduced to 6.25 mg twice daily, Entresto continue. She was discharged with foley and following with urology. ? ?She presents to [place] today for follow-up with her daughter [name].  She lives in assisted living facility and she has multiple family members to check on her often.  She is planning to establish with new NP Blossom Hoops for primary care as she can be seen at her facility.  She notes dizziness which has been ongoing for couple years and occurs intermittently.  Sometimes feels like lightheadedness and symptoms feels like spinning.  Eats and drinks very little throughout the day taking only bites of meals and sips of cranberry juice or lemonade or tea at mealtimes.  She shares that her hemoglobin has been monitored by her primary care provider and most recently was 9.5 a week ago.  She reports no near-syncope, syncope, palpitations. Her exertional dyspnea is stable at baseline and encouraged her to continue participating in PT. ? ?EKGs/Labs/Other Studies Reviewed:  ? ?The following studies were reviewed today: ?Echo 09/06/2021 ?1. Left ventricular ejection fraction, by estimation, is 45 to 50%. The  ?left ventricle has mildly decreased function. The left ventricle  ?demonstrates global hypokinesis. Diastolic function indeterminant due to  ?severe MAC. Elevated left atrial pressure.  ? 2. Right ventricular systolic function mildly-to-moderately reduced. The  ?right ventricular size is mildly-to-moderately enlarged. While RV is  ?incompletely visualized, there appears to be relative preservation of RV  ?apical function compared to RV free  ?wall which can be seen in acute PE. Recommend clinical correlation.  ? 3. Left atrial size was moderately dilated.  ? 4. The mitral valve is degenerative. There is moderate mitral  valve  ?regurgitation. Severe mitral annular calcification. Suspect at least mild  ?calcific mitral stenosis with mean gradient 46mHg at HR 98bpm. Unable to  ?calculate MVA as suboptimal LVOT VTI  ?signal and patient terminated TTE early.  ? 5. The aortic valve is not well visualized on SAX view, however, on PLAX  ?view it appears to be heavily calcified and thickened with severe leaflet  ?restriction. Mean gradient 643mg, peak 1.75m40m. Due to incomplete LVOT VTI  ?signal, unable to assess AVA  ?and DI and patient ended exam early. Suspect there at least mild vs  ?moderate aortic valve stenosis. There is trivial aortic regurgitation.  ? 6. IVC was not interrogated  ? ?Comparison(s): Compared to prior TTE in 04/2021, the RV on limited views  ?appears more dilated and hypokinetic. There is also now moderate mitral  ?regurgitation which was previously mild. There continues to be at least  ?mild aortic stenosis. EF appears slightly worse at 45-50%.  ? ?Carotid duplex 04/2022 ?Summary:  ?Right Carotid: Velocities in the right ICA are consistent with a 40-59%  ?               stenosis. Non-hemodynamically significant plaque <50% noted  ?in  ?  the CCA. The ECA appears >50% stenosed.  ? ?Left Carotid: Velocities in the left ICA are consistent with a 40-59%  ?stenosis.  ?             Non-hemodynamically significant plaque <50% noted in the  ?CCA. The  ?              ECA appears >50% stenosed.  ? ?Vertebrals:  Bilateral vertebral arteries demonstrate antegrade flow.  ?Subclavians: Right subclavian artery flow was disturbed. Normal flow  ?             hemodynamics were seen in the left subclavian artery.  ? ?Echo 04/2021 ? 1. Left ventricular ejection fraction, by estimation, is 50 to 55%. The  ?left ventricle has low normal function. The left ventricle has no regional  ?wall motion abnormalities. Left ventricular diastolic parameters are  ?consistent with Grade II diastolic  ?dysfunction (pseudonormalization).  Elevated left atrial pressure. The  ?average left ventricular global longitudinal strain is -12.0 %. The global  ?longitudinal strain is abnormal.  ? 2. Right ventricular systolic function is normal. The rig

## 2021-11-12 ENCOUNTER — Encounter (HOSPITAL_BASED_OUTPATIENT_CLINIC_OR_DEPARTMENT_OTHER): Payer: Self-pay | Admitting: Family

## 2021-11-12 DIAGNOSIS — J9611 Chronic respiratory failure with hypoxia: Secondary | ICD-10-CM | POA: Diagnosis not present

## 2021-11-12 DIAGNOSIS — D500 Iron deficiency anemia secondary to blood loss (chronic): Secondary | ICD-10-CM | POA: Diagnosis not present

## 2021-11-12 DIAGNOSIS — I5041 Acute combined systolic (congestive) and diastolic (congestive) heart failure: Secondary | ICD-10-CM | POA: Diagnosis not present

## 2021-11-12 DIAGNOSIS — I480 Paroxysmal atrial fibrillation: Secondary | ICD-10-CM | POA: Diagnosis not present

## 2021-11-12 DIAGNOSIS — G928 Other toxic encephalopathy: Secondary | ICD-10-CM | POA: Diagnosis not present

## 2021-11-12 DIAGNOSIS — I110 Hypertensive heart disease with heart failure: Secondary | ICD-10-CM | POA: Diagnosis not present

## 2021-11-13 ENCOUNTER — Ambulatory Visit (HOSPITAL_BASED_OUTPATIENT_CLINIC_OR_DEPARTMENT_OTHER): Payer: Medicare Other | Admitting: Cardiovascular Disease

## 2021-11-13 DIAGNOSIS — I110 Hypertensive heart disease with heart failure: Secondary | ICD-10-CM | POA: Diagnosis not present

## 2021-11-13 DIAGNOSIS — D500 Iron deficiency anemia secondary to blood loss (chronic): Secondary | ICD-10-CM | POA: Diagnosis not present

## 2021-11-13 DIAGNOSIS — J9611 Chronic respiratory failure with hypoxia: Secondary | ICD-10-CM | POA: Diagnosis not present

## 2021-11-13 DIAGNOSIS — I480 Paroxysmal atrial fibrillation: Secondary | ICD-10-CM | POA: Diagnosis not present

## 2021-11-13 DIAGNOSIS — G928 Other toxic encephalopathy: Secondary | ICD-10-CM | POA: Diagnosis not present

## 2021-11-13 DIAGNOSIS — I5041 Acute combined systolic (congestive) and diastolic (congestive) heart failure: Secondary | ICD-10-CM | POA: Diagnosis not present

## 2021-11-17 ENCOUNTER — Encounter: Payer: Self-pay | Admitting: Family Medicine

## 2021-11-17 ENCOUNTER — Non-Acute Institutional Stay: Payer: Self-pay | Admitting: Family Medicine

## 2021-11-17 ENCOUNTER — Other Ambulatory Visit: Payer: Self-pay

## 2021-11-17 VITALS — BP 112/82 | Temp 97.7°F | Resp 22

## 2021-11-17 DIAGNOSIS — Z515 Encounter for palliative care: Secondary | ICD-10-CM | POA: Diagnosis not present

## 2021-11-17 DIAGNOSIS — I5042 Chronic combined systolic (congestive) and diastolic (congestive) heart failure: Secondary | ICD-10-CM | POA: Diagnosis not present

## 2021-11-17 DIAGNOSIS — G928 Other toxic encephalopathy: Secondary | ICD-10-CM | POA: Diagnosis not present

## 2021-11-17 DIAGNOSIS — J9601 Acute respiratory failure with hypoxia: Secondary | ICD-10-CM

## 2021-11-17 DIAGNOSIS — I110 Hypertensive heart disease with heart failure: Secondary | ICD-10-CM | POA: Diagnosis not present

## 2021-11-17 DIAGNOSIS — J9611 Chronic respiratory failure with hypoxia: Secondary | ICD-10-CM | POA: Diagnosis not present

## 2021-11-17 DIAGNOSIS — D638 Anemia in other chronic diseases classified elsewhere: Secondary | ICD-10-CM

## 2021-11-17 DIAGNOSIS — R319 Hematuria, unspecified: Secondary | ICD-10-CM | POA: Diagnosis not present

## 2021-11-17 DIAGNOSIS — R8290 Unspecified abnormal findings in urine: Secondary | ICD-10-CM | POA: Diagnosis not present

## 2021-11-17 DIAGNOSIS — I480 Paroxysmal atrial fibrillation: Secondary | ICD-10-CM | POA: Diagnosis not present

## 2021-11-17 DIAGNOSIS — D500 Iron deficiency anemia secondary to blood loss (chronic): Secondary | ICD-10-CM | POA: Diagnosis not present

## 2021-11-17 DIAGNOSIS — I5041 Acute combined systolic (congestive) and diastolic (congestive) heart failure: Secondary | ICD-10-CM | POA: Diagnosis not present

## 2021-11-17 HISTORY — DX: Paroxysmal atrial fibrillation: I48.0

## 2021-11-17 NOTE — Progress Notes (Signed)
? ? ?Manufacturing engineer ?Community Palliative Care Consult Note ?Telephone: [phone]  ?Fax: [phone]  ? ?Date of encounter: 11/17/21 ?12:45 PM ?PATIENT NAME: Helen [name] ?[place] [address]   ?[phone] (home)  ?DOB: 1930-03-13 ?MRN: [id number] ?PRIMARY CARE PROVIDER:    ?Gus Height, NP,  ?[address] ?[place] [address] ?[phone] ? ?REFERRING PROVIDER:   ?Gus Height, NP ?[address] ?[place],  [place] [address] ?[phone] ? ?RESPONSIBLE PARTY:    ?Contact Information   ? ? Name Relation Home Work Mobile  ? Harrison,Erin Daughter [phone]    ? Joslyn Hy Daughter [phone]    ? Elin, Fenley Son [phone]    ? [name],[name] Daughter [phone]  [phone]  ? Diedre, Maclellan Son   [phone]  ? ?  ? ? ? ?I met face to face with patient in [facility] facility. Palliative Care was asked to follow this patient by consultation request of  Gus Height, NP to address advance care planning and complex medical decision making. This is the initial visit.  ? ? ?      ASSESSMENT, SYMPTOM MANAGEMENT AND PLAN / RECOMMENDATIONS:  ?Palliative care encounter ?Recent hospitalization for acute on chronic heart failure had patient's status is DNR DNI with comfort measures. ?Addressed with healthcare POA or family. ? ?Chronic combined systolic and diastolic heart failure ?Symptomatic with dyspnea. ?Last BNP 09/07/2021 elevated at 854.6. ?Requested BNP, insufficient blood obtained for result. ?Continue Entresto 24-26 mg twice daily, Lasix 30 mg daily, carvedilol 12.5 mg twice daily ?Hemodynamically stable ? ?Acute respiratory failure with hypoxia ?Continues on O2 at 2 L nasal cannula with O2 sat 99%. ?Suspect either continued heart failure versus possible symptomatic anemia. ?CMP. ? ?Anemia of chronic disease ?Prior history of bleed with need for transfusion. ?Continues on [place] with last HGB 09/12/21  8.4. ?Hemoccult positive stool on 08/20/2021 ?On iron supplementation with complaint of black tarry stool. ?CBC with differential. ? ? ?Follow up Palliative Care Visit: Palliative care will continue to follow for complex medical decision making, advance care planning, and clarification of goals. Return 2 weeks or prn. ? ? ? ?This visit was coded based on medical decision making (MDM). ? ?PPS: 40% ? ?HOSPICE ELIGIBILITY/DIAGNOSIS: TBD ? ?Chief Complaint:  ?[facility] received a referral to follow up with patient for chronic disease management of congestive heart failure and acute respiratory failure on oxygen.  Needs follow-up on advance directive and defining/refining goals of care. ? ?HISTORY OF PRESENT ILLNESS:  Helen [name] is a 88 y.o. year old female with combined chronic systolic and diastolic congestive heart failure, acute respiratory failure with hypoxia, paroxysmal A-fib on Eliquis, PVD, history of stroke and carotid stenosis, hypertension, nonischemic cardiomyopathy, history of right breast cancer with radiation, diabetes mellitus, anemia of chronic disease, dyspnea and history of COVID-19.  Spoke with facility staff who indicated that patient is largely bedbound and does not wish to stay up for any length of time.  States she gets up to eat and then returns to bed.  She has indwelling Foley catheter.  She is on O2 at 2 L per nasal cannula.  Staff indicates her food and fluid intake is poor.  On approach patient noted to be in bed.  She does not indicate worsening dyspnea, denies chest pain, nausea or vomiting.  She does indicate that when she exerts to sit up or to get up and move her dyspnea worsens denies  PND.  Endorses feeling extremely lethargic and fearful of falling.  She does have rollator at bedside.  She indicates her stools have been black but she is on medication that contributes to this and does indicate that stools are sticky and tarry.  Denies any recent  fall.  Prior to this provider leaving, PT was noted to come assess and indicated previously patient was unable to participate due to similar symptoms of dyspnea and lethargy. ? On 09/06/21 pt had echo: Study Result ? ?Left ventricular EF 45 to 50%. Mildly decreased LV function with global hypokinesis.  Elevated left atrial pressure. Right ventricular systolic function mildly-to-moderately reduced. RV size mildly-to-moderately enlarged.  ?Left atrial size was moderately dilated.  ?Degenerative mitral valve with moderate mitral valve  ?Regurgitation and severe mitral annular calcification. Suspect at least mild  ?calcific mitral stenosis with mean gradient 44mHg at HR 98bpm.  ? 5. The aortic valve is not well visualized on SAX view, however, on PLAX  ?view it appears to be heavily calcified and thickened with severe leaflet  ?restriction. Mean gradient 678mg, peak 1.66m70m. Suspect there at least mild vs  ?moderate aortic valve stenosis.  ? ? ?History obtained from review of EMR, discussion with facility staff and Ms. WalVolanda Napoleon? ?Left voicemail at 2115 for emergency contact, daughter EriKegan Shepardson ?I reviewed available labs, medications, imaging, studies and related documents from the EMR.  Records reviewed and summarized above.  ? ?ROS ?General: NAD ?EYES: denies vision changes ?ENMT: denies dysphagia ?Cardiovascular: denies chest pain, endorses DOE ?Pulmonary: denies cough, denies increased SOB ?Abdomen: endorses poor appetite, denies constipation ?GU: denies dysuria ?MSK: Endorses generalized weakness and fatigue, no falls reported ?Skin: denies rashes or wounds ?Neurological: denies pain, denies insomnia ?Psych: Endorses positive mood ?Heme/lymph/immuno: denies bruises, abnormal bleeding ? ?Physical Exam: ?Current and past weights: As of 11/11/2021 weight was 143 pounds 9.6 ounces, weight on 02/13/2021 was 143 pounds ?Constitutional: NAD, noted pallor upon sitting up ?General: frail appearing  ?EYES: anicteric sclera,  lids intact, no discharge  ?ENMT: intact hearing, oral mucous membranes moist, dentition intact ?CV: S1S2, significantly irregularly irregular with weakness of radial pulse, no LE edema.  Dyspneic at rest with having to stop after 3-4 words to take a breath. ?Pulmonary: CTAB, no increased work of breathing, no cough, room air ?Abdomen: normo-active BS + 4 quadrants, non tender and moderately distended ?GU: Has indwelling Foley catheter to gravity drainage draining dark amber urine ?MSK: no sarcopenia, moves all extremities, ambulatory with rollator ?Skin: warm and dry, no rashes or wounds on visible skin ?Neuro: Noted generalized weakness, no cognitive impairment ?Psych: non-anxious affect, A and O x 3 ?Hem/lymph/immuno: no widespread bruising or bleeding ? ?CURRENT PROBLEM LIST:  ?Patient Active Problem List  ? Diagnosis Date Noted  ? Paroxysmal atrial fibrillation (HCC[facility] Rich[facility]3/27/2023  ? Acute respiratory failure with hypoxia (HCC[facility]1/14/2023  ? CHF exacerbation (HCCFif[facility] Ward1/13/2023  ? Symptomatic anemia 08/20/2021  ? Occult blood in stools 08/20/2021  ? COVID-19 01/04/2021  ? CAP (community acquired pneumonia) 01/03/2021  ? Nonischemic cardiomyopathy ([facility]CC[facility]4/17/2018  ? SOB (shortness of breath) 11/12/2016  ? Shortness of breath 11/11/2016  ? Chronic respiratory failure with hypoxia (HCCAs[place]3/21/2018  ? Anemia of chronic disease 03/24/2016  ? Chronic combined systolic and diastolic heart failure (HCC[facility]8/08/2015  ? Acute respiratory failure with hypoxia and hypercarbia (HCCHunting[place]8/08/2015  ? Scarring of lung following radiation- [facility] 03/24/2016  ? Essential hypertension   ? Acute encephalopathy 12/13/2015  ? Hyponatremia 12/13/2015  ?  Hypokalemia 12/13/2015  ? UTI (lower urinary tract infection) 12/13/2015  ? Leukocytosis 12/13/2015  ? Hypercholesteremia   ? Diabetes mellitus without complication ([place])   ? History of stroke   ? Peripheral vascular disease ([place])   ? Carotid stenosis 03/13/2015  ? ?PAST MEDICAL HISTORY:   ?Active Ambulatory Problems  ?  Diagnosis Date Noted  ? Carotid stenosis 03/13/2015  ? Hypercholesteremia   ? Diabetes mellitus without complication ([place])   ? History of stroke   ? Peripheral vascular disease ([place])

## 2021-11-19 DIAGNOSIS — I480 Paroxysmal atrial fibrillation: Secondary | ICD-10-CM | POA: Diagnosis not present

## 2021-11-19 DIAGNOSIS — I110 Hypertensive heart disease with heart failure: Secondary | ICD-10-CM | POA: Diagnosis not present

## 2021-11-19 DIAGNOSIS — G928 Other toxic encephalopathy: Secondary | ICD-10-CM | POA: Diagnosis not present

## 2021-11-19 DIAGNOSIS — D500 Iron deficiency anemia secondary to blood loss (chronic): Secondary | ICD-10-CM | POA: Diagnosis not present

## 2021-11-19 DIAGNOSIS — I5041 Acute combined systolic (congestive) and diastolic (congestive) heart failure: Secondary | ICD-10-CM | POA: Diagnosis not present

## 2021-11-19 DIAGNOSIS — J9611 Chronic respiratory failure with hypoxia: Secondary | ICD-10-CM | POA: Diagnosis not present

## 2021-11-20 DIAGNOSIS — R338 Other retention of urine: Secondary | ICD-10-CM | POA: Diagnosis not present

## 2021-11-21 DIAGNOSIS — J9611 Chronic respiratory failure with hypoxia: Secondary | ICD-10-CM | POA: Diagnosis not present

## 2021-11-21 DIAGNOSIS — I480 Paroxysmal atrial fibrillation: Secondary | ICD-10-CM | POA: Diagnosis not present

## 2021-11-21 DIAGNOSIS — I5041 Acute combined systolic (congestive) and diastolic (congestive) heart failure: Secondary | ICD-10-CM | POA: Diagnosis not present

## 2021-11-21 DIAGNOSIS — I110 Hypertensive heart disease with heart failure: Secondary | ICD-10-CM | POA: Diagnosis not present

## 2021-11-21 DIAGNOSIS — D500 Iron deficiency anemia secondary to blood loss (chronic): Secondary | ICD-10-CM | POA: Diagnosis not present

## 2021-11-21 DIAGNOSIS — G928 Other toxic encephalopathy: Secondary | ICD-10-CM | POA: Diagnosis not present

## 2021-11-22 ENCOUNTER — Emergency Department (HOSPITAL_COMMUNITY): Payer: Medicare Other

## 2021-11-22 ENCOUNTER — Encounter (HOSPITAL_COMMUNITY): Payer: Self-pay

## 2021-11-22 ENCOUNTER — Inpatient Hospital Stay (HOSPITAL_COMMUNITY)
Admission: EM | Admit: 2021-11-22 | Discharge: 2021-12-22 | DRG: 951 | Disposition: E | Payer: Medicare Other | Attending: Pulmonary Disease | Admitting: Pulmonary Disease

## 2021-11-22 DIAGNOSIS — Z91048 Other nonmedicinal substance allergy status: Secondary | ICD-10-CM

## 2021-11-22 DIAGNOSIS — D65 Disseminated intravascular coagulation [defibrination syndrome]: Secondary | ICD-10-CM | POA: Diagnosis present

## 2021-11-22 DIAGNOSIS — I4891 Unspecified atrial fibrillation: Secondary | ICD-10-CM | POA: Diagnosis not present

## 2021-11-22 DIAGNOSIS — D684 Acquired coagulation factor deficiency: Secondary | ICD-10-CM | POA: Diagnosis present

## 2021-11-22 DIAGNOSIS — Z833 Family history of diabetes mellitus: Secondary | ICD-10-CM

## 2021-11-22 DIAGNOSIS — K7200 Acute and subacute hepatic failure without coma: Secondary | ICD-10-CM | POA: Diagnosis present

## 2021-11-22 DIAGNOSIS — Z853 Personal history of malignant neoplasm of breast: Secondary | ICD-10-CM

## 2021-11-22 DIAGNOSIS — Z809 Family history of malignant neoplasm, unspecified: Secondary | ICD-10-CM

## 2021-11-22 DIAGNOSIS — R579 Shock, unspecified: Secondary | ICD-10-CM | POA: Diagnosis not present

## 2021-11-22 DIAGNOSIS — D62 Acute posthemorrhagic anemia: Secondary | ICD-10-CM | POA: Diagnosis not present

## 2021-11-22 DIAGNOSIS — I110 Hypertensive heart disease with heart failure: Secondary | ICD-10-CM | POA: Diagnosis not present

## 2021-11-22 DIAGNOSIS — E7800 Pure hypercholesterolemia, unspecified: Secondary | ICD-10-CM | POA: Diagnosis present

## 2021-11-22 DIAGNOSIS — Z8249 Family history of ischemic heart disease and other diseases of the circulatory system: Secondary | ICD-10-CM

## 2021-11-22 DIAGNOSIS — R4182 Altered mental status, unspecified: Secondary | ICD-10-CM | POA: Diagnosis not present

## 2021-11-22 DIAGNOSIS — R570 Cardiogenic shock: Secondary | ICD-10-CM | POA: Diagnosis present

## 2021-11-22 DIAGNOSIS — E872 Acidosis, unspecified: Secondary | ICD-10-CM | POA: Diagnosis present

## 2021-11-22 DIAGNOSIS — Z79899 Other long term (current) drug therapy: Secondary | ICD-10-CM

## 2021-11-22 DIAGNOSIS — Z823 Family history of stroke: Secondary | ICD-10-CM

## 2021-11-22 DIAGNOSIS — Z20822 Contact with and (suspected) exposure to covid-19: Secondary | ICD-10-CM | POA: Diagnosis present

## 2021-11-22 DIAGNOSIS — D649 Anemia, unspecified: Secondary | ICD-10-CM | POA: Diagnosis not present

## 2021-11-22 DIAGNOSIS — R571 Hypovolemic shock: Secondary | ICD-10-CM | POA: Diagnosis present

## 2021-11-22 DIAGNOSIS — R079 Chest pain, unspecified: Secondary | ICD-10-CM | POA: Diagnosis not present

## 2021-11-22 DIAGNOSIS — I480 Paroxysmal atrial fibrillation: Secondary | ICD-10-CM | POA: Diagnosis present

## 2021-11-22 DIAGNOSIS — J90 Pleural effusion, not elsewhere classified: Secondary | ICD-10-CM | POA: Diagnosis not present

## 2021-11-22 DIAGNOSIS — K922 Gastrointestinal hemorrhage, unspecified: Secondary | ICD-10-CM

## 2021-11-22 DIAGNOSIS — Z9221 Personal history of antineoplastic chemotherapy: Secondary | ICD-10-CM

## 2021-11-22 DIAGNOSIS — N179 Acute kidney failure, unspecified: Secondary | ICD-10-CM

## 2021-11-22 DIAGNOSIS — R0902 Hypoxemia: Secondary | ICD-10-CM | POA: Diagnosis not present

## 2021-11-22 DIAGNOSIS — Z66 Do not resuscitate: Secondary | ICD-10-CM | POA: Diagnosis present

## 2021-11-22 DIAGNOSIS — E1151 Type 2 diabetes mellitus with diabetic peripheral angiopathy without gangrene: Secondary | ICD-10-CM | POA: Diagnosis present

## 2021-11-22 DIAGNOSIS — Z888 Allergy status to other drugs, medicaments and biological substances status: Secondary | ICD-10-CM

## 2021-11-22 DIAGNOSIS — Z885 Allergy status to narcotic agent status: Secondary | ICD-10-CM

## 2021-11-22 DIAGNOSIS — E875 Hyperkalemia: Secondary | ICD-10-CM | POA: Diagnosis present

## 2021-11-22 DIAGNOSIS — F0390 Unspecified dementia without behavioral disturbance: Secondary | ICD-10-CM | POA: Diagnosis not present

## 2021-11-22 DIAGNOSIS — I517 Cardiomegaly: Secondary | ICD-10-CM | POA: Diagnosis not present

## 2021-11-22 DIAGNOSIS — I5043 Acute on chronic combined systolic (congestive) and diastolic (congestive) heart failure: Secondary | ICD-10-CM | POA: Diagnosis present

## 2021-11-22 DIAGNOSIS — Z515 Encounter for palliative care: Principal | ICD-10-CM

## 2021-11-22 DIAGNOSIS — R402411 Glasgow coma scale score 13-15, in the field [EMT or ambulance]: Secondary | ICD-10-CM

## 2021-11-22 DIAGNOSIS — I959 Hypotension, unspecified: Secondary | ICD-10-CM | POA: Diagnosis not present

## 2021-11-22 DIAGNOSIS — Z8673 Personal history of transient ischemic attack (TIA), and cerebral infarction without residual deficits: Secondary | ICD-10-CM

## 2021-11-22 DIAGNOSIS — Z9013 Acquired absence of bilateral breasts and nipples: Secondary | ICD-10-CM

## 2021-11-22 DIAGNOSIS — R6521 Severe sepsis with septic shock: Secondary | ICD-10-CM | POA: Diagnosis not present

## 2021-11-22 DIAGNOSIS — Z7401 Bed confinement status: Secondary | ICD-10-CM | POA: Diagnosis not present

## 2021-11-22 DIAGNOSIS — Z7901 Long term (current) use of anticoagulants: Secondary | ICD-10-CM

## 2021-11-22 DIAGNOSIS — A419 Sepsis, unspecified organism: Secondary | ICD-10-CM | POA: Diagnosis not present

## 2021-11-22 DIAGNOSIS — Z882 Allergy status to sulfonamides status: Secondary | ICD-10-CM

## 2021-11-22 DIAGNOSIS — R109 Unspecified abdominal pain: Secondary | ICD-10-CM | POA: Diagnosis not present

## 2021-11-22 DIAGNOSIS — Z87891 Personal history of nicotine dependence: Secondary | ICD-10-CM

## 2021-11-22 DIAGNOSIS — R410 Disorientation, unspecified: Secondary | ICD-10-CM | POA: Diagnosis not present

## 2021-11-22 DIAGNOSIS — R918 Other nonspecific abnormal finding of lung field: Secondary | ICD-10-CM | POA: Diagnosis not present

## 2021-11-22 DIAGNOSIS — I2510 Atherosclerotic heart disease of native coronary artery without angina pectoris: Secondary | ICD-10-CM | POA: Diagnosis present

## 2021-11-22 DIAGNOSIS — R000 Tachycardia, unspecified: Secondary | ICD-10-CM | POA: Diagnosis not present

## 2021-11-22 LAB — RESP PANEL BY RT-PCR (FLU A&B, COVID) ARPGX2
Influenza A by PCR: NEGATIVE
Influenza B by PCR: NEGATIVE
SARS Coronavirus 2 by RT PCR: NEGATIVE

## 2021-11-22 LAB — PREPARE RBC (CROSSMATCH)

## 2021-11-22 LAB — I-STAT VENOUS BLOOD GAS, ED
Acid-Base Excess: 4 mmol/L — ABNORMAL HIGH (ref 0.0–2.0)
Bicarbonate: 28.2 mmol/L — ABNORMAL HIGH (ref 20.0–28.0)
Calcium, Ion: 1.06 mmol/L — ABNORMAL LOW (ref 1.15–1.40)
HCT: 28 % — ABNORMAL LOW (ref 36.0–46.0)
Hemoglobin: 9.5 g/dL — ABNORMAL LOW (ref 12.0–15.0)
O2 Saturation: 80 %
Potassium: 6.1 mmol/L — ABNORMAL HIGH (ref 3.5–5.1)
Sodium: 136 mmol/L (ref 135–145)
TCO2: 29 mmol/L (ref 22–32)
pCO2, Ven: 38.2 mmHg — ABNORMAL LOW (ref 44–60)
pH, Ven: 7.477 — ABNORMAL HIGH (ref 7.25–7.43)
pO2, Ven: 41 mmHg (ref 32–45)

## 2021-11-22 LAB — COMPREHENSIVE METABOLIC PANEL
ALT: 335 U/L — ABNORMAL HIGH (ref 0–44)
AST: 479 U/L — ABNORMAL HIGH (ref 15–41)
Albumin: 2.5 g/dL — ABNORMAL LOW (ref 3.5–5.0)
Alkaline Phosphatase: 101 U/L (ref 38–126)
Anion gap: 10 (ref 5–15)
BUN: 54 mg/dL — ABNORMAL HIGH (ref 8–23)
CO2: 27 mmol/L (ref 22–32)
Calcium: 8.5 mg/dL — ABNORMAL LOW (ref 8.9–10.3)
Chloride: 102 mmol/L (ref 98–111)
Creatinine, Ser: 3.02 mg/dL — ABNORMAL HIGH (ref 0.44–1.00)
GFR, Estimated: 14 mL/min — ABNORMAL LOW (ref >60)
Glucose, Bld: 91 mg/dL (ref 70–99)
Potassium: 6.3 mmol/L (ref 3.5–5.1)
Sodium: 139 mmol/L (ref 135–145)
Total Bilirubin: 1.9 mg/dL — ABNORMAL HIGH (ref 0.3–1.2)
Total Protein: 5.5 g/dL — ABNORMAL LOW (ref 6.5–8.1)

## 2021-11-22 LAB — MAGNESIUM: Magnesium: 1.7 mg/dL (ref 1.7–2.4)

## 2021-11-22 LAB — CBC WITH DIFFERENTIAL/PLATELET
Abs Immature Granulocytes: 0.25 10*3/uL — ABNORMAL HIGH (ref 0.00–0.07)
Basophils Absolute: 0 10*3/uL (ref 0.0–0.1)
Basophils Relative: 0 %
Eosinophils Absolute: 0 10*3/uL (ref 0.0–0.5)
Eosinophils Relative: 0 %
HCT: 23.9 % — ABNORMAL LOW (ref 36.0–46.0)
Hemoglobin: 7.1 g/dL — ABNORMAL LOW (ref 12.0–15.0)
Immature Granulocytes: 3 %
Lymphocytes Relative: 3 %
Lymphs Abs: 0.3 10*3/uL — ABNORMAL LOW (ref 0.7–4.0)
MCH: 25.8 pg — ABNORMAL LOW (ref 26.0–34.0)
MCHC: 29.7 g/dL — ABNORMAL LOW (ref 30.0–36.0)
MCV: 86.9 fL (ref 80.0–100.0)
Monocytes Absolute: 0.4 10*3/uL (ref 0.1–1.0)
Monocytes Relative: 4 %
Neutro Abs: 8.3 10*3/uL — ABNORMAL HIGH (ref 1.7–7.7)
Neutrophils Relative %: 90 %
Platelets: 41 10*3/uL — ABNORMAL LOW (ref 150–400)
RBC: 2.75 MIL/uL — ABNORMAL LOW (ref 3.87–5.11)
RDW: 21.6 % — ABNORMAL HIGH (ref 11.5–15.5)
WBC: 9.2 10*3/uL (ref 4.0–10.5)
nRBC: 0 % (ref 0.0–0.2)

## 2021-11-22 LAB — I-STAT CHEM 8, ED
BUN: 54 mg/dL — ABNORMAL HIGH (ref 8–23)
BUN: 60 mg/dL — ABNORMAL HIGH (ref 8–23)
Calcium, Ion: 0.77 mmol/L — CL (ref 1.15–1.40)
Calcium, Ion: 1.04 mmol/L — ABNORMAL LOW (ref 1.15–1.40)
Chloride: 102 mmol/L (ref 98–111)
Chloride: 107 mmol/L (ref 98–111)
Creatinine, Ser: 2.8 mg/dL — ABNORMAL HIGH (ref 0.44–1.00)
Creatinine, Ser: 3.1 mg/dL — ABNORMAL HIGH (ref 0.44–1.00)
Glucose, Bld: 84 mg/dL (ref 70–99)
Glucose, Bld: 91 mg/dL (ref 70–99)
HCT: 20 % — ABNORMAL LOW (ref 36.0–46.0)
HCT: 32 % — ABNORMAL LOW (ref 36.0–46.0)
Hemoglobin: 10.9 g/dL — ABNORMAL LOW (ref 12.0–15.0)
Hemoglobin: 6.8 g/dL — CL (ref 12.0–15.0)
Potassium: 5.5 mmol/L — ABNORMAL HIGH (ref 3.5–5.1)
Potassium: 6.3 mmol/L (ref 3.5–5.1)
Sodium: 136 mmol/L (ref 135–145)
Sodium: 137 mmol/L (ref 135–145)
TCO2: 24 mmol/L (ref 22–32)
TCO2: 28 mmol/L (ref 22–32)

## 2021-11-22 LAB — PROTIME-INR
INR: 5.9 (ref 0.8–1.2)
Prothrombin Time: 52.6 seconds — ABNORMAL HIGH (ref 11.4–15.2)

## 2021-11-22 LAB — BPAM PLATELET PHERESIS
Blood Product Expiration Date: 202304022359
Unit Type and Rh: 600

## 2021-11-22 LAB — POC OCCULT BLOOD, ED: Fecal Occult Bld: POSITIVE — AB

## 2021-11-22 LAB — PREPARE PLATELET PHERESIS: Unit division: 0

## 2021-11-22 LAB — CBG MONITORING, ED: Glucose-Capillary: 90 mg/dL (ref 70–99)

## 2021-11-22 LAB — LACTIC ACID, PLASMA: Lactic Acid, Venous: 2 mmol/L (ref 0.5–1.9)

## 2021-11-22 LAB — BRAIN NATRIURETIC PEPTIDE: B Natriuretic Peptide: 1923.3 pg/mL — ABNORMAL HIGH (ref 0.0–100.0)

## 2021-11-22 LAB — TROPONIN I (HIGH SENSITIVITY): Troponin I (High Sensitivity): 58 ng/L — ABNORMAL HIGH (ref <18)

## 2021-11-22 MED ORDER — MIDAZOLAM HCL 2 MG/2ML IJ SOLN: 2.0000 mg | INTRAMUSCULAR | Status: DC | PRN

## 2021-11-22 MED ORDER — GLYCOPYRROLATE 1 MG PO TABS
1.0000 mg | ORAL_TABLET | ORAL | Status: DC | PRN
  Filled 2021-11-22: qty 1

## 2021-11-22 MED ORDER — SODIUM CHLORIDE 0.9% IV SOLUTION: Freq: Once | INTRAVENOUS | Status: DC

## 2021-11-22 MED ORDER — VANCOMYCIN HCL 1250 MG/250ML IV SOLN
1250.0000 mg | Freq: Once | INTRAVENOUS | Status: DC
  Filled 2021-11-22: qty 250

## 2021-11-22 MED ORDER — FENTANYL 2500MCG IN NS 250ML (10MCG/ML) PREMIX INFUSION
0.0000 ug/h | INTRAVENOUS | Status: DC
  Administered 2021-11-22: 100 ug/h via INTRAVENOUS
  Filled 2021-11-22: qty 250

## 2021-11-22 MED ORDER — DOCUSATE SODIUM 100 MG PO CAPS: 100.0000 mg | ORAL_CAPSULE | Freq: Two times a day (BID) | ORAL | Status: DC | PRN

## 2021-11-22 MED ORDER — CALCIUM GLUCONATE-NACL 1-0.675 GM/50ML-% IV SOLN: 1.0000 g | Freq: Once | INTRAVENOUS | Status: DC

## 2021-11-22 MED ORDER — FENTANYL CITRATE PF 50 MCG/ML IJ SOSY: 50.0000 ug | PREFILLED_SYRINGE | INTRAMUSCULAR | Status: DC | PRN

## 2021-11-22 MED ORDER — SODIUM CHLORIDE 0.9 % IV SOLN: 10.0000 mL/h | Freq: Once | INTRAVENOUS | Status: DC

## 2021-11-22 MED ORDER — NOREPINEPHRINE 4 MG/250ML-% IV SOLN
0.0000 ug/min | INTRAVENOUS | Status: DC
  Administered 2021-11-22: 5 ug/min via INTRAVENOUS
  Filled 2021-11-22 (×2): qty 250

## 2021-11-22 MED ORDER — SODIUM CHLORIDE 0.9 % IV BOLUS: 1000.0000 mL | Freq: Once | INTRAVENOUS | Status: DC

## 2021-11-22 MED ORDER — DIPHENHYDRAMINE HCL 50 MG/ML IJ SOLN: 25.0000 mg | INTRAMUSCULAR | Status: DC | PRN

## 2021-11-22 MED ORDER — POLYVINYL ALCOHOL 1.4 % OP SOLN
1.0000 [drp] | Freq: Four times a day (QID) | OPHTHALMIC | Status: DC | PRN
  Filled 2021-11-22: qty 15

## 2021-11-22 MED ORDER — CALCIUM GLUCONATE-NACL 1-0.675 GM/50ML-% IV SOLN
1.0000 g | Freq: Once | INTRAVENOUS | Status: DC
  Filled 2021-11-22: qty 50

## 2021-11-22 MED ORDER — GLYCOPYRROLATE 0.2 MG/ML IJ SOLN: 0.2000 mg | INTRAMUSCULAR | Status: DC | PRN

## 2021-11-22 MED ORDER — METRONIDAZOLE 500 MG/100ML IV SOLN
500.0000 mg | Freq: Once | INTRAVENOUS | Status: DC
  Filled 2021-11-22: qty 100

## 2021-11-22 MED ORDER — LACTATED RINGERS IV SOLN: INTRAVENOUS | Status: DC

## 2021-11-22 MED ORDER — POLYETHYLENE GLYCOL 3350 17 G PO PACK: 17.0000 g | PACK | Freq: Every day | ORAL | Status: DC | PRN

## 2021-11-22 MED ORDER — DEXTROSE 50 % IV SOLN
1.0000 | Freq: Once | INTRAVENOUS | Status: AC
  Administered 2021-11-22: 50 mL via INTRAVENOUS

## 2021-11-22 MED ORDER — FENTANYL BOLUS VIA INFUSION
100.0000 ug | INTRAVENOUS | Status: DC | PRN
  Administered 2021-11-22: 100 ug via INTRAVENOUS
  Filled 2021-11-22: qty 100

## 2021-11-22 MED ORDER — SODIUM CHLORIDE 0.9 % IV SOLN: 1.0000 g | INTRAVENOUS | Status: DC

## 2021-11-22 MED ORDER — LACTATED RINGERS IV BOLUS
500.0000 mL | Freq: Once | INTRAVENOUS | Status: AC
  Administered 2021-11-22: 500 mL via INTRAVENOUS

## 2021-11-22 MED ORDER — GLYCOPYRROLATE 0.2 MG/ML IJ SOLN
0.2000 mg | INTRAMUSCULAR | Status: DC | PRN
Start: 2021-11-22 — End: 2021-11-22

## 2021-11-22 MED ORDER — ACETAMINOPHEN 650 MG RE SUPP: 650.0000 mg | Freq: Four times a day (QID) | RECTAL | Status: DC | PRN

## 2021-11-22 MED ORDER — SODIUM CHLORIDE 0.9 % IV SOLN
2.0000 g | Freq: Once | INTRAVENOUS | Status: DC
  Administered 2021-11-22: 2 g via INTRAVENOUS
  Filled 2021-11-22: qty 2

## 2021-11-22 MED ORDER — VANCOMYCIN VARIABLE DOSE PER UNSTABLE RENAL FUNCTION (PHARMACIST DOSING)
Status: DC
Start: 2021-11-22 — End: 2021-11-22

## 2021-11-22 MED ORDER — VANCOMYCIN HCL IN DEXTROSE 1-5 GM/200ML-% IV SOLN: 1000.0000 mg | Freq: Once | INTRAVENOUS | Status: DC

## 2021-11-22 MED ORDER — INSULIN ASPART 100 UNIT/ML IV SOLN
5.0000 [IU] | Freq: Once | INTRAVENOUS | Status: AC
  Administered 2021-11-22: 5 [IU] via INTRAVENOUS

## 2021-11-22 MED ORDER — ACETAMINOPHEN 325 MG PO TABS: 650.0000 mg | ORAL_TABLET | Freq: Four times a day (QID) | ORAL | Status: DC | PRN

## 2021-11-22 MED ORDER — PROTHROMBIN COMPLEX CONC HUMAN 500 UNITS IV KIT
1610.0000 [IU] | PACK | Status: DC
  Filled 2021-11-22: qty 1610

## 2021-11-22 MED ORDER — DIAZEPAM 5 MG/ML IJ SOLN
2.0000 mg | Freq: Once | INTRAMUSCULAR | Status: AC
Start: 2021-11-22 — End: 2021-11-22
  Administered 2021-11-22: 2 mg via INTRAVENOUS
  Filled 2021-11-22: qty 2

## 2021-11-22 MED ORDER — VASOPRESSIN 20 UNITS/100 ML INFUSION FOR SHOCK
0.0000 [IU]/min | INTRAVENOUS | Status: DC
  Administered 2021-11-22: 0.03 [IU]/min via INTRAVENOUS

## 2021-11-23 LAB — TYPE AND SCREEN
ABO/RH(D): O POS
Antibody Screen: NEGATIVE
Unit division: 0
Unit division: 0
Unit division: 0

## 2021-11-23 LAB — BLOOD CULTURE ID PANEL (REFLEXED) - BCID2

## 2021-11-23 LAB — BPAM RBC
Blood Product Expiration Date: 202305012359
Blood Product Expiration Date: 202305042359
Blood Product Expiration Date: 202305042359
ISSUE DATE / TIME: 202304011327
ISSUE DATE / TIME: 202304011846
ISSUE DATE / TIME: 202304012223
Unit Type and Rh: 5100
Unit Type and Rh: 5100
Unit Type and Rh: 5100

## 2021-11-25 LAB — CULTURE, BLOOD (ROUTINE X 2)

## 2021-12-22 NOTE — ED Notes (Signed)
Pt now moaning in pain and breathing independently. Carotid pulse present. Femoral pulse present. Reconfirmed DNR status w/ family members at bedside ?

## 2021-12-22 NOTE — ED Provider Notes (Signed)
?[facility] ?Provider Note ? ? ?CSN: [id number] ?Arrival date & time: 12-02-21  1145 ? ?  ? ?History ? ?Chief Complaint  ?Patient presents with  ? Hypotension  ? ? ?Helen [name] is a 88 y.o. female. ? ?HPI ?Patient presents from skilled nursing facility via EMS for concern of change in mental status.  From what EMS gathered from nursing home staff, patient is conversant at baseline.  She does likely have some dementia.  Her last known normal was reportedly last night.  Today, she has been less interactive.  She is not able to engage in conversation.  She has primarily been moaning and seems to be agitated.  EMS reports normal blood glucose.  Vital signs were notable for tachycardia with atrial fibrillation and hypertension.  They were not able to get a reliable temperature.  She is on 2 L of supplemental oxygen at baseline.  No interventions were given prior to arrival.  On arrival, patient unable to provide history.  Per chart review, her medical history includes HLD, DM2, CVA, PVD, HTN, anemia, CHF, and atrial fibrillation.  Her most recent hospitalization was in January.  CODE STATUS was DNR/DNI at that time.  She was seen by palliative care 1 week ago.  At that time, she endorsed lethargy and fear of falling.  She was noted to be primarily bedbound, but did have rollator at bedside.   ? ?History per family: Patient has had a progressive decline in function and mentation over the past several days.  She had a dramatic change in her mentation since yesterday.  They confirm that she is conversant at baseline.  They confirmed that her CODE STATUS is DNR/DNI. ?  ? ?Home Medications ?Prior to Admission medications   ?Medication Sig Start Date End Date Taking? Authorizing Provider  ?acetaminophen (TYLENOL) 500 MG tablet Take 500 mg by mouth every 8 (eight) hours as needed for mild pain.   Yes [provider]  ?apixaban (ELIQUIS) 5 MG TABS tablet Take 1 tablet (5 mg  total) by mouth 2 (two) times daily. 11/11/21  Yes Loel Dubonnet, NP  ?atorvastatin (LIPITOR) 80 MG tablet Take 80 mg by mouth daily.   Yes [provider]  ?BIOTIN PO Take 1,000 mcg by mouth daily.   Yes [provider]  ?busPIRone (BUSPAR) 5 MG tablet Take 1 tablet by mouth 2 (two) times daily. 10/31/21  Yes [provider]  ?carvedilol (COREG) 12.5 MG tablet Take 12.5 mg by mouth 2 (two) times daily. 10/28/21  Yes [provider]  ?cholecalciferol (VITAMIN D) 1000 units tablet Take 1,000 Units by mouth every evening.    Yes [provider]  ?ferrous gluconate (FERGON) 324 MG tablet Take 324 mg by mouth 2 (two) times daily with a meal.   Yes [provider]  ?furosemide (LASIX) 20 MG tablet Take 30 mg by mouth daily. Taking 1 & 1/2 tablet daily   Yes [provider]  ?magnesium oxide (MAG-OX) 400 MG tablet 1 tablet by mouth twice a day for 1 week and then 1 daily ?Patient taking differently: Take 400 mg by mouth daily. 03/24/18  Yes Skeet Latch, MD  ?Multiple Vitamin (MULTIVITAMIN WITH MINERALS) TABS tablet Take 1 tablet by mouth daily.    Yes [provider]  ?pantoprazole (PROTONIX) 40 MG tablet Take 1 tablet (40 mg total) by mouth 2 (two) times daily before a meal. 12/14/19  Yes Amin, Jeanella Flattery, MD  ?potassium chloride SA (  KLOR-CON M) 20 MEQ tablet Take 2 tablets (40 mEq total) by mouth daily. 09/16/21  Yes Nita Sells, MD  ?sacubitril-valsartan (ENTRESTO) 24-26 MG Take 1 tablet by mouth 2 (two) times daily. 11/11/21  Yes Loel Dubonnet, NP  ?senna-docusate (SENOKOT-S) 8.6-50 MG tablet Take 2 tablets by mouth at bedtime as needed for mild constipation or moderate constipation. ?Patient taking differently: Take 1 tablet by mouth daily. 12/14/19  Yes Amin, Jeanella Flattery, MD  ?sodium chloride 1 g tablet Take 1 tablet (1 g total) by mouth 3 (three) times daily with meals. 09/15/21  Yes Nita Sells, MD  ?sucralfate  (CARAFATE) 1 g tablet Take 1 tablet (1 g total) by mouth 2 (two) times daily with a meal. 08/26/21  Yes Georgette Shell, MD  ?vitamin C (ASCORBIC ACID) 500 MG tablet Take 500 mg by mouth daily.    Yes [provider]  ?   ? ?Allergies    ?Morphine and related, Other, Sulfa antibiotics, Sulfasalazine, Citalopram, and Nickel   ? ?Review of Systems   ?Review of Systems  ?Unable to perform ROS: Mental status change  ? ?Physical Exam ?Updated Vital Signs ?BP (!) 0/0   Pulse (!) 0   Temp 98.4 ?F (36.9 ?C) (Rectal)   Resp (!) 0   Ht 5' 4.5" (1.638 m)   Wt 65 kg   SpO2 92%   BMI 24.22 kg/m?  ?Physical Exam ?Constitutional:   ?   General: She is awake. She is in acute distress.  ?   Appearance: She is normal weight. She is ill-appearing. She is not toxic-appearing or diaphoretic.  ?HENT:  ?   Head: Normocephalic and atraumatic.  ?   Right Ear: External ear normal.  ?   Left Ear: External ear normal.  ?   Nose: Nose normal.  ?   Mouth/Throat:  ?   Mouth: Mucous membranes are dry.  ?Eyes:  ?   General: No scleral icterus. ?   Extraocular Movements: Extraocular movements intact.  ?Cardiovascular:  ?   Rate and Rhythm: Tachycardia present. Rhythm irregular.  ?Pulmonary:  ?   Effort: Tachypnea present. No accessory muscle usage, prolonged expiration or respiratory distress.  ?   Breath sounds: Transmitted upper airway sounds present. No decreased air movement. No decreased breath sounds, wheezing or rales.  ?Abdominal:  ?   General: There is distension.  ?   Palpations: Abdomen is soft.  ?   Tenderness: There is no abdominal tenderness. There is no guarding or rebound.  ?Musculoskeletal:     ?   General: No deformity.  ?   Cervical back: Neck supple. No rigidity.  ?   Right lower leg: No edema.  ?   Left lower leg: No edema.  ?Skin: ?   General: Skin is cool and dry.  ?Neurological:  ?   General: No focal deficit present.  ?   Mental Status: She is disoriented.  ?   GCS: GCS eye subscore is 4. GCS verbal  subscore is 4. GCS motor subscore is 6.  ?Psychiatric:     ?   Mood and Affect: Mood is anxious.     ?   Behavior: Behavior is agitated.  ? ? ?ED Results / Procedures / Treatments   ?Labs ?(all labs ordered are listed, but only abnormal results are displayed) ?Labs Reviewed  ?LACTIC ACID, PLASMA - Abnormal; Notable for the following components:  ?    Result Value  ? Lactic Acid, Venous 2.0 (*)   ?  All other components within normal limits  ?COMPREHENSIVE METABOLIC PANEL - Abnormal; Notable for the following components:  ? Potassium 6.3 (*)   ? BUN 54 (*)   ? Creatinine, Ser 3.02 (*)   ? Calcium 8.5 (*)   ? Total Protein 5.5 (*)   ? Albumin 2.5 (*)   ? AST 479 (*)   ? ALT 335 (*)   ? Total Bilirubin 1.9 (*)   ? GFR, Estimated 14 (*)   ? All other components within normal limits  ?BRAIN NATRIURETIC PEPTIDE - Abnormal; Notable for the following components:  ? B Natriuretic Peptide 1,923.3 (*)   ? All other components within normal limits  ?PROTIME-INR - Abnormal; Notable for the following components:  ? Prothrombin Time 52.6 (*)   ? INR 5.9 (*)   ? All other components within normal limits  ?CBC WITH DIFFERENTIAL/PLATELET - Abnormal; Notable for the following components:  ? RBC 2.75 (*)   ? Hemoglobin 7.1 (*)   ? HCT 23.9 (*)   ? MCH 25.8 (*)   ? MCHC 29.7 (*)   ? RDW 21.6 (*)   ? Platelets 41 (*)   ? Neutro Abs 8.3 (*)   ? Lymphs Abs 0.3 (*)   ? Abs Immature Granulocytes 0.25 (*)   ? All other components within normal limits  ?POC OCCULT BLOOD, ED - Abnormal; Notable for the following components:  ? Fecal Occult Bld POSITIVE (*)   ? All other components within normal limits  ?I-STAT CHEM 8, ED - Abnormal; Notable for the following components:  ? Potassium 6.3 (*)   ? BUN 60 (*)   ? Creatinine, Ser 3.10 (*)   ? Calcium, Ion 1.04 (*)   ? Hemoglobin 10.9 (*)   ? HCT 32.0 (*)   ? All other components within normal limits  ?I-STAT VENOUS BLOOD GAS, ED - Abnormal; Notable for the following components:  ? pH, Ven 7.477 (*)    ? pCO2, Ven 38.2 (*)   ? Bicarbonate 28.2 (*)   ? Acid-Base Excess 4.0 (*)   ? Potassium 6.1 (*)   ? Calcium, Ion 1.06 (*)   ? HCT 28.0 (*)   ? Hemoglobin 9.5 (*)   ? All other components within normal limits  ?I-STAT CHEM 8

## 2021-12-22 NOTE — ED Notes (Signed)
Pt was sat up at a 90 degree angle to obtain CXR and abd film. Pt noted to be come grey, w/ agonal respirations and unresponsive. Pt immediately laid back down. Carotid pulse unable to be palpated. NO compressions based on previously discussed DNR. MD Doren Custard to bedside, staff to assist at bedside. Levophed increased to 76mg per MD Dixon. BP during unresponsive episode 40/24. Care ongoing ?

## 2021-12-22 NOTE — Progress Notes (Signed)
Pharmacy Antibiotic Note ? ?Helen [name] is a 88 y.o. female admitted on Nov 28, 2021 presenting from facility with hypotension and pain, concern for sepsis.  Pharmacy has been consulted for cefepime and vancomycin dosing. ? ?Plan: ?Vancomycin 1250 mg IV x 1, then variable dosing due to unstable renal function ?Cefepime 2g IV x 1, then 1g IV q 24h ?Monitor renal function, Cx and clinical progression to narrow ?Vancomycin levels as indicated ? ?  ? ?Temp (24hrs), Avg:98.4 ?F (36.9 ?C), Min:98.4 ?F (36.9 ?C), Max:98.4 ?F (36.9 ?C) ? ?Recent Labs  ?Lab 11/28/21 ?1200 11/28/2021 ?1202 November 28, 2021 ?1234 11-28-2021 ?1313 2021/11/28 ?1314  ?WBC  --   --   --   --  9.2  ?CREATININE  --  3.02* 3.10* 2.80*  --   ?LATICACIDVEN 2.0*  --   --   --   --   ?  ?Estimated Creatinine Clearance: 11.8 mL/min (A) (by C-G formula based on SCr of 2.8 mg/dL (H)).   ? ?Allergies  ?Allergen Reactions  ? Morphine And Related Nausea And Vomiting  ? Other Other (See Comments)  ?  Other reaction(s): Other (See Comments) ?Bar[employer] Car[employer]: learned through allergy testing. Unknown reaction-- MD said not very allergic but not to eat large quantities ?Barley [employer] Carrots: learned through allergy testing. Unknown reaction-- MD said not very allergic but not to eat large quantities  ? Sulfa Antibiotics Hives  ? Sulfasalazine Hives  ? Citalopram Other (See Comments)  ?  QT prolongation ?  ? Nickel Rash  ? ? ?Bertis Ruddy, PharmD ?Clinical Pharmacist ?ED Pharmacist Phone # [phone] ?Nov 28, 2021 2:22 PM ? ?

## 2021-12-22 NOTE — ED Notes (Signed)
Noted to be in asystole on the monitor, entered room to assess pt. This RN and second RN Enriqueta Shutter felt for pulse and listened to heart for full 60 seconds w/ no audible S1, S2 oand no palpable pulse. Pt pronounced deceased at 1533 on 2021-12-14. ?

## 2021-12-22 NOTE — Death Summary Note (Signed)
?DEATH SUMMARY  ? ?Patient Details  ?Name: Helen [name] ?MRN: [id number] ?DOB: 05/08/1930 ? ?Admission/Discharge Information  ? ?Admit Date:  2021-12-12  ?Date of Death:  2021/12/12  ?Time of Death:   11-26-31  ?Length of Stay: 0  ?Referring Physician: Gus Height, NP  ? ?Reason(s) for Hospitalization  ?Shock with multisystem organ failure  ? ?Diagnoses  ?Preliminary cause of death: Shock ?Secondary Diagnoses (including complications and co-morbidities):  ?Principal Problem: ?  Shock (Helen [name]) ?GI bleed ?Hypovolemic shock ?Acute on chronic systolic and diastolic HF ?Acute kidney failure ?Acute liver failure ?Coagulopathy ?Hyperbilirubinemia ?Thrombocytopenia ?Hyperkalemia ?Hypocalcemia ?Terminal delirium  ?Lactic acidosis ?DIC  ?Acute blood loss anemia superimposed on chronic anemia ?DNR status ?Hx CAD ?Hx CVA ?Hx HTN ?Hx breast cancer  ?Hx DM ?Hx PVD  ? ? ? ?Brief Hospital Course (including significant findings, care, treatment, and services provided and events leading to death)  ?Helen [name] is a 88 y.o. year old female who was followed outpatient by [facility] for palliative care, chronic systolic and diastolic HF, HTN, Hld, prior CVA, CAD, breast cancer s/p chemo and double mastectomy anemia presented to ED 2022-12-13 with AMS. This began 2 days prior to presentation and worsened, prompting ED visit. In ED noted to be in shock requiring IVF and pressors. Her pressor requirement notably increased to NE at 50 and vaso 0.03, and as such a CVC was placed. As labs resulted, it was noted that pt had hgb 7 plt 41 INR 6, BNP 1900 as well as evidence of a significant kidney injury with hyperkalemia and acute livery failure. Emergency release blood was ordered for transfusion.  ? ?PCCM was consulted for admission in this setting. In PCCM discussion with family at the bedside in the ED, ongoing aggressive care would not be congruent with patient wishes. She was compassionately transitioned to comfort care, and died 2021/12/12 at  15:33 with family at bedside.  ? ?Pertinent Labs and Studies  ?Significant Diagnostic Studies ?DG Chest 1 View ? ?Result Date: 12/12/2021 ?CLINICAL DATA:  Hypotension.  Chest pain. EXAM: CHEST  1 VIEW COMPARISON:  Chest radiograph dated September 05, 2021 FINDINGS: The heart is enlarged. There is right lower lobe opacity which may represent pleural effusion and/or atelectasis. Underlying airspace disease can not be excluded. Bilateral chronic interstitial lung markings representing chronic lung disease/fibrosis, unchanged. IMPRESSION: 1.  Stable cardiomegaly. 2. Large right lower lobe opacity which may represent pleural effusion and/or atelectasis, underlying airspace disease can not be excluded. Electronically Signed   By: Keane Police D.O.   On: December 12, 2021 13:36  ? ?DG Chest Portable 1 View ? ?Result Date: December 12, 2021 ?CLINICAL DATA:  Central line placement EXAM: PORTABLE CHEST 1 VIEW COMPARISON:  Dec 12, 2021 at 1310 hours FINDINGS: Patient is rotated. Interval placement of left IJ approach central venous catheter with distal tip terminating at the level of the proximal SVC. Stable cardiomegaly. Small right pleural effusion with associated right basilar opacity. No pneumothorax. IMPRESSION: Interval placement of left IJ approach central venous catheter with distal tip terminating at the level of the proximal SVC. No pneumothorax. Electronically Signed   By: Davina Poke D.O.   On: 12-12-21 14:41  ? ?DG Abd Portable 2V ? ?Result Date: 2021/12/12 ?CLINICAL DATA:  Pain.  Hypotensive. EXAM: PORTABLE ABDOMEN - 2 VIEW COMPARISON:  None. FINDINGS: No dilated loops of small bowel. Gaseous distension of the right colon is identified with gas noted up to the level of the rectum. Cholecystectomy clips. No radio-opaque calculi or other significant  radiographic abnormality is seen. IMPRESSION: Nonobstructive bowel gas pattern. Electronically Signed   By: Kerby Moors M.D.   On: December 08, 2021 13:34   ? ?Microbiology ?Recent Results  (from the past 240 hour(s))  ?Resp Panel by RT-PCR (Flu A&B, Covid) Peripheral     Status: None  ? Collection Time: December 08, 2021  1:10 PM  ? Specimen: Peripheral; Nasopharyngeal(NP) swabs in vial transport medium  ?Result Value Ref Range Status  ? SARS Coronavirus 2 by RT PCR NEGATIVE NEGATIVE Final  ?  Comment: (NOTE) ?SARS-CoV-2 target nucleic acids are NOT DETECTED. ? ?The SARS-CoV-2 RNA is generally detectable in upper respiratory ?specimens during the acute phase of infection. The lowest ?concentration of SARS-CoV-2 viral copies this assay can detect is ?138 copies/mL. A negative result does not preclude SARS-Cov-2 ?infection and should not be used as the sole basis for treatment or ?other patient management decisions. A negative result may occur with  ?improper specimen collection/handling, submission of specimen other ?than nasopharyngeal swab, presence of viral mutation(s) within the ?areas targeted by this assay, and inadequate number of viral ?copies(<138 copies/mL). A negative result must be combined with ?clinical observations, patient history, and epidemiological ?information. The expected result is Negative. ? ?Fact Sheet for Patients:  ?EntrepreneurPulse.com.au ? ?Fact Sheet for Healthcare Providers:  ?IncredibleEmployment.be ? ?This test is no t yet approved or cleared by the [place] FDA and  ?has been authorized for detection and/or diagnosis of SARS-CoV-2 by ?FDA under an Emergency Use Authorization (EUA). This EUA will remain  ?in effect (meaning this test can be used) for the duration of the ?COVID-19 declaration under Section 564(b)(1) of the Act, 21 ?U.S.C.section 360bbb-3(b)(1), unless the authorization is terminated  ?or revoked sooner.  ? ? ?  ? Influenza A by PCR NEGATIVE NEGATIVE Final  ? Influenza B by PCR NEGATIVE NEGATIVE Final  ?  Comment: (NOTE) ?The Xpert Xpress SARS-CoV-2/FLU/RSV plus assay is intended as an aid ?in the diagnosis of influenza  from Nasopharyngeal swab specimens and ?should not be used as a sole basis for treatment. Nasal washings and ?aspirates are unacceptable for Xpert Xpress SARS-CoV-2/FLU/RSV ?testing. ? ?Fact Sheet for Patients: ?EntrepreneurPulse.com.au ? ?Fact Sheet for Healthcare Providers: ?IncredibleEmployment.be ? ?This test is not yet approved or cleared by the [place] FDA and ?has been authorized for detection and/or diagnosis of SARS-CoV-2 by ?FDA under an Emergency Use Authorization (EUA). This EUA will remain ?in effect (meaning this test can be used) for the duration of the ?COVID-19 declaration under Section 564(b)(1) of the Act, 21 U.S.C. ?section 360bbb-3(b)(1), unless the authorization is terminated or ?revoked. ? ?Performed at [facility], [place] [address], [place], [place] ?[address] ?  ? ? ?Lab ?Basic Metabolic Panel: ?Recent Labs  ?Lab 12/08/21 ?1202 2021/12/08 ?1234 December 08, 2021 ?1313  ?NA 139 136  136 137  ?K 6.3* 6.1*  6.3* 5.5*  ?CL 102 102 107  ?CO2 27  --   --   ?GLUCOSE 91 91 84  ?BUN 54* 60* 54*  ?CREATININE 3.02* 3.10* 2.80*  ?CALCIUM 8.5*  --   --   ?MG 1.7  --   --   ? ?Liver Function Tests: ?Recent Labs  ?Lab Dec 08, 2021 ?1202  ?AST 479*  ?ALT 335*  ?ALKPHOS 101  ?BILITOT 1.9*  ?PROT 5.5*  ?ALBUMIN 2.5*  ? ?No results for input(s): LIPASE, AMYLASE in the last 168 hours. ?No results for input(s): AMMONIA in the last 168 hours. ?CBC: ?Recent Labs  ?Lab 12-08-2021 ?1234 12-08-2021 ?1313 2021-12-08 ?1314  ?  WBC  --   --  9.2  ?NEUTROABS  --   --  8.3*  ?HGB 9.5*  10.9* 6.8* 7.1*  ?HCT 28.0*  32.0* 20.0* 23.9*  ?MCV  --   --  86.9  ?PLT  --   --  41*  ? ?Cardiac Enzymes: ?No results for input(s): CKTOTAL, CKMB, CKMBINDEX, TROPONINI in the last 168 hours. ?Sepsis Labs: ?Recent Labs  ?Lab 11/29/21 ?1200 11-29-21 ?1314  ?WBC  --  9.2  ?LATICACIDVEN 2.0*  --   ? ? ?Procedures/Operations  ?4/1 CVC  ? ? ?Eliseo Gum MSN, AGACNP-BC ?[facility]  [facility] ?Amion ?29-Nov-2021, 3:49 PM ? ? ? ?

## 2021-12-22 NOTE — H&P (Addendum)
? ?NAME:  Helen [name], MRN:  [id number], DOB:  Feb 05, 1930, LOS: 0 ?ADMISSION DATE:  2021-12-06, CONSULTATION DATE:  2021-12-06 ?REFERRING MD:  Doren Custard - EM, CHIEF COMPLAINT:  Shock   ? ?History of Present Illness:  ?88 yo F DNR status followed outpatient by [facility] for palliative care, chronic systolic and diastolic HF, HTN, Hld, prior CVA, CAD, breast cancer s/p chemo and double mastectomy anemia presented to ED 4/1 with AMS. In ED noted to be in shock requiring IVF and pressors. Her pressor requirement notably increased to NE at 50 and vaso 0.03, and as such a CVC was placed. As labs resulted, it was noted that pt had hgb 7 plt 41 INR 6, BNP 1900 as well as evidence of a significant kidney injury with hyperkalemia and acute livery failure. Emergency release blood was ordered for transfusion.  ? ?PCCM called for admission in this setting  ? ?Pertinent  Medical History  ?DNR status ?Systolic and diastolic HF ?HTN ?CAD ?HLD ?CVA ?Anemia  ?Breast cancer  ? ?Significant Hospital Events: ?Including procedures, antibiotic start and stop dates in addition to other pertinent events   ?4/1 to ED in shock, started on pressors, transfused. PCCM called to admit   ? ?Interim History / Subjective:  ?Receiving PRBC ?On 50 NE 0.03 vaso  ? ?Family at bedside  ? ?Objective   ?Blood pressure 115/71, pulse 96, temperature 98.4 ?F (36.9 ?C), temperature source Rectal, resp. rate (!) 26, SpO2 (!) 89 %. ?   ?   ? ?Intake/Output Summary (Last 24 hours) at 2021-12-06 1518 ?Last data filed at 12/06/2021 1457 ?Gross per 24 hour  ?Intake 756.67 ml  ?Output --  ?Net 756.67 ml  ? ?There were no vitals filed for this visit. ? ?Examination: ?General: Critically ill elderly F supine in stretcher, uncomfortable appearing ?HENT: NCAT. L IJ CVC with bloody oozing. Trachea midline  ?Lungs: symmetrical chest expansion, shallow.  ?Cardiovascular: tachycardic, regular. Cap refill sluggish  ?Abdomen: soft round  ?Extremities: no acute joint deformity. No  cyanosis or clubbing  ?Neuro: Moaning in pain does not follow commands, opens eyes to stimulation ?GU: defer ? ?Resolved Hospital Problem list   ? ? ?Assessment & Plan:  ? ? ?Terminal Delirium ?Multifactorial shock: hypovolemic and cardiogenic  ?Acute on chronic systolic and diastolic heart failure ?Acute liver failure with coagulopathy and hyperbilirubinemia  ?AKI ?Hyperkalemia ?GIB ?Acute blood loss anemia, on chronic anemia ?DIC  ?Hypocalcemia  ?Lactic acidosis ?DNR Status  ?Goals of care / encounter for palliative care  ?D ?-in discussion with family at the bedside in the ED, ongoing aggressive care would not be congruent with patient wishes. She has shock and multisystem organ failure and sadly is dying.  ?P ?-Plan for comfort care when rest of family arrives, no escalation of care in interim  ?-no further transfusion, cancel [facility], no further labs, imaging, no abx  ?-will add comfort care orders including fent gtt with bolus off gtt, PRN ativan. Fent gtt to start now in the ED and titrate/bolus to pt comfort, while we continue current pressors and await rest of family to arrive, but will not escalate pressor doses or add additional pressors ?-on family arrival, when ready we will dc pressors and focus solely on comfort care at end of life  ?-DNR/I ? ?Best Practice (right click and "Reselect all SmartList Selections" daily)  ? ?Diet/type: NPO ?DVT prophylaxis: not indicated ?GI prophylaxis: N/A ?Lines: Central line ?Foley:  N/A ?Code Status:  DNR ?Last date of multidisciplinary  goals of care discussion [4/1 Met with 3 daughters at bedside and discussed current critical illness, and discussed previously noted code status and focus on comfort documented in [facility] notes. Decision to transition to comfort care when rest of family arrives ] ? ?Labs   ?CBC: ?Recent Labs  ?Lab 2021-12-16 ?1234 Dec 16, 2021 ?1313 2021/12/16 ?1314  ?WBC  --   --  9.2  ?NEUTROABS  --   --  8.3*  ?HGB 9.5*  10.9* 6.8* 7.1*  ?HCT 28.0*   32.0* 20.0* 23.9*  ?MCV  --   --  86.9  ?PLT  --   --  41*  ? ? ?Basic Metabolic Panel: ?Recent Labs  ?Lab 12/16/21 ?1202 12-16-2021 ?1234 2021/12/16 ?1313  ?NA 139 136  136 137  ?K 6.3* 6.1*  6.3* 5.5*  ?CL 102 102 107  ?CO2 27  --   --   ?GLUCOSE 91 91 84  ?BUN 54* 60* 54*  ?CREATININE 3.02* 3.10* 2.80*  ?CALCIUM 8.5*  --   --   ?MG 1.7  --   --   ? ?GFR: ?Estimated Creatinine Clearance: 11.8 mL/min (A) (by C-G formula based on SCr of 2.8 mg/dL (H)). ?Recent Labs  ?Lab 2021/12/16 ?1200 12-16-21 ?1314  ?WBC  --  9.2  ?LATICACIDVEN 2.0*  --   ? ? ?Liver Function Tests: ?Recent Labs  ?Lab 12-16-2021 ?1202  ?AST 479*  ?ALT 335*  ?ALKPHOS 101  ?BILITOT 1.9*  ?PROT 5.5*  ?ALBUMIN 2.5*  ? ?No results for input(s): LIPASE, AMYLASE in the last 168 hours. ?No results for input(s): AMMONIA in the last 168 hours. ? ?ABG ?   ?Component Value Date/Time  ? HCO3 28.2 (H) 12-16-2021 1234  ? TCO2 24 December 16, 2021 1313  ? O2SAT 80 12/16/21 1234  ?  ? ?Coagulation Profile: ?Recent Labs  ?Lab December 16, 2021 ?1200  ?INR 5.9*  ? ? ?Cardiac Enzymes: ?No results for input(s): CKTOTAL, CKMB, CKMBINDEX, TROPONINI in the last 168 hours. ? ?HbA1C: ?Hgb A1c MFr Bld  ?Date/Time Value Ref Range Status  ?01/06/2021 04:08 AM 6.8 (H) 4.8 - 5.6 % Final  ?  Comment:  ?  (NOTE) ?Pre diabetes:          5.7%-6.4% ? ?Diabetes:              >6.4% ? ?Glycemic control for   <7.0% ?adults with diabetes ?  ?01/05/2021 02:49 AM 6.8 (H) 4.8 - 5.6 % Final  ?  Comment:  ?  (NOTE) ?Pre diabetes:          5.7%-6.4% ? ?Diabetes:              >6.4% ? ?Glycemic control for   <7.0% ?adults with diabetes ?  ? ? ?CBG: ?Recent Labs  ?Lab December 16, 2021 ?1151  ?GLUCAP 90  ? ? ?Review of Systems:   ?Unable to obtain due to encephalopathy  ? ?Past Medical History:  ?She,  has a past medical history of Anemia, Anxiety, Cancer ([facility]), Chronic combined systolic (congestive) and diastolic (congestive) heart failure ([place]), Depression, Diabetes mellitus, History of stroke (11/2016),  Hypercholesteremia, Hypertension, Paroxysmal atrial fibrillation ([place]) (11/17/2021), Peripheral vascular disease ([place]) (2015), Stroke ([place]), and UTI (lower urinary tract infection) (12/13/2015).  ? ?Surgical History:  ? ?Past Surgical History:  ?Procedure Laterality Date  ? ABDOMINAL HYSTERECTOMY    ? APPENDECTOMY  1951  ? BIOPSY  12/13/2019  ? Procedure: BIOPSY;  Surgeon: Wilford Corner, MD;  Location: [facility];  Service: Endoscopy;;  ? CALDWELL LUC SINUSOTOMY  1977  ? CHOLECYSTECTOMY  2003  ? By Dr. Excell Seltzer  ? COLONOSCOPY WITH PROPOFOL N/A 12/13/2019  ? Procedure: COLONOSCOPY WITH PROPOFOL;  Surgeon: Wilford Corner, MD;  Location: [facility];  Service: Endoscopy;  Laterality: N/A;  ? ESOPHAGOGASTRODUODENOSCOPY N/A 12/13/2019  ? Procedure: ESOPHAGOGASTRODUODENOSCOPY (EGD);  Surgeon: Wilford Corner, MD;  Location: [facility];  Service: Endoscopy;  Laterality: N/A;  ? ESOPHAGOGASTRODUODENOSCOPY (EGD) WITH PROPOFOL N/A 08/22/2021  ? Procedure: ESOPHAGOGASTRODUODENOSCOPY (EGD) WITH PROPOFOL;  Surgeon: Ronnette Juniper, MD;  Location: [facility];  Service: Gastroenterology;  Laterality: N/A;  ? EYE SURGERY Bilateral 2009  ? Cataract surgery  ? FRACTURE SURGERY Right   ? ORIF   by Dr. Burney Gauze  ? HUMERUS FRACTURE SURGERY    ? MASTECTOMY Bilateral   ? NASAL SEPTUM SURGERY  1977  ? Dr. Ernesto Rutherford  ? POLYPECTOMY  12/13/2019  ? Procedure: POLYPECTOMY;  Surgeon: Wilford Corner, MD;  Location: [facility];  Service: Endoscopy;;  ? TOENAIL EXCISION    ?  ? ?Social History:  ? reports that she quit smoking about 43 years ago. Her smoking use included cigarettes. She has never used smokeless tobacco. She reports that she does not drink alcohol and does not use drugs.  ? ?Family History:  ?Her family history includes Cancer in her mother; Diabetes in her brother; Heart attack in her father; Heart disease in her father; Hyperlipidemia in her brother; Kidney disease in her brother; Pulmonary embolism in her mother; Stroke  in her brother and father.  ? ?Allergies ?Allergies  ?Allergen Reactions  ? Morphine And Related Nausea And Vomiting  ? Other Other (See Comments)  ?  Other reaction(s): Other (See Comments) ?[employer]: learned through

## 2021-12-22 NOTE — Plan of Care (Signed)
Full Note to follow ? ?Briefly 88 yo F DNR/I F who presented to the ED unfortunately with multisystem organ failure and significant shock, likely multifactorial. On 50 NE and 0.03 vaso, receiving emergency release blood  ? ?In discussions with the patient's family, decision made to focus on comfort at the end of life.  ?There are a few family members who are planning to come to the hospital, and hope to see the patient before she passes ? ?Plan to continue current pressors, complete the unit that is currently transfusing, but no escalation of care from current levels. ? ?DNR ?Start fent gtt with bolus of gtt ?PRN BZD  ? ? ?Eliseo Gum MSN, AGACNP-BC ?[facility] ?Amion for pager ?12/09/2021, 2:45 PM ? ? ? ? ?

## 2021-12-22 NOTE — ED Triage Notes (Signed)
Pt BIB [facility] from [facility] for eval of hypotension. EMS reports pt has been c/o pain and noted to be hypotensive at facility this AM. Pt arrives Moaning, and obviously uncomfortable. EMS reports hypotensive en route w/ SBP 85-90. Unable to obtain IV access. ?

## 2021-12-22 NOTE — Progress Notes (Signed)
Chaplain responded to page for family support of mother who had died.  Chaplain found family members at bedside.  Chaplain offered ministry of presence asking for stories of their mother.  They all shared how rich and long her life had been and how her health had gone down only 2 months ago. Pt's parish priest visited her and anointed her in her home last night.  Chaplain offered brief prayer at bedside. ? ?De [place]rs ?Chaplain ?

## 2021-12-22 DEATH — deceased

## 2022-01-14 ENCOUNTER — Ambulatory Visit (HOSPITAL_BASED_OUTPATIENT_CLINIC_OR_DEPARTMENT_OTHER): Payer: Medicare Other | Admitting: Family

## 2022-06-29 IMAGING — DX DG CHEST 1V PORT
1 series · 1 of 1 positions shown · non-contrast
Comparison: 11/22/2021 at 8087 hours

CLINICAL DATA: Central line placement

EXAM:
PORTABLE CHEST 1 VIEW

[chest ap]
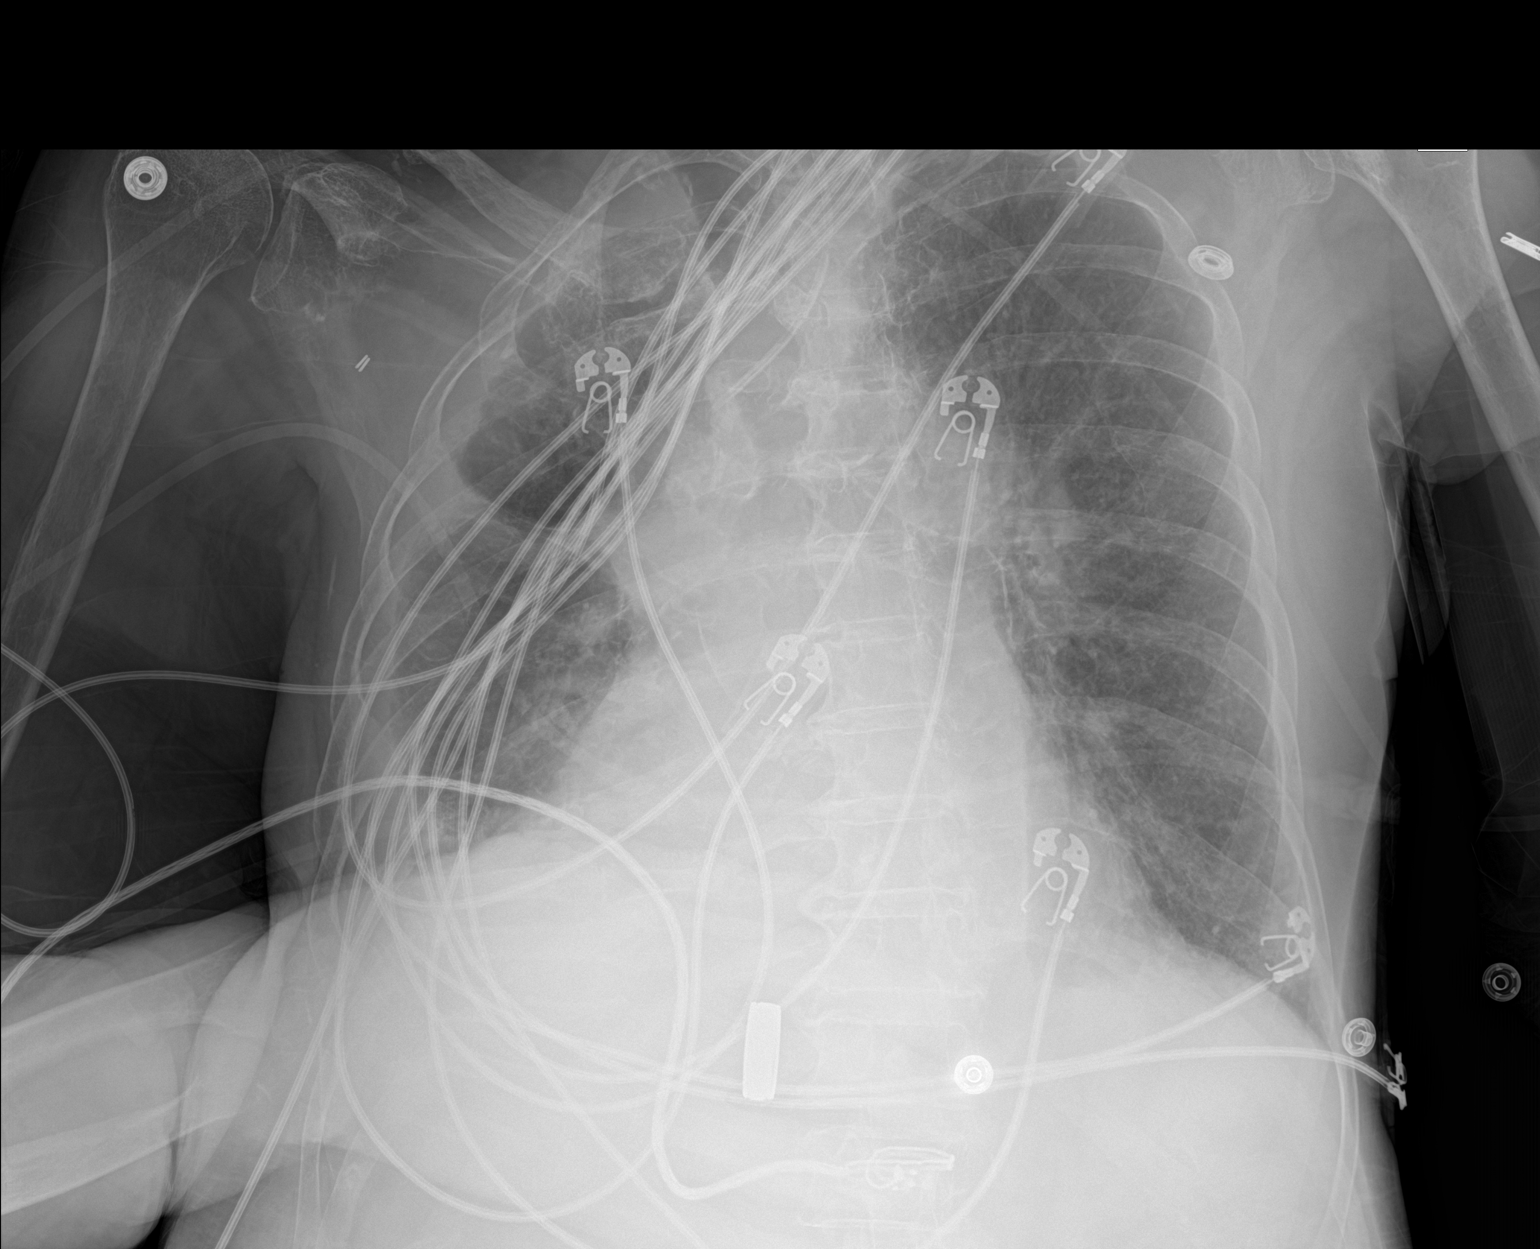

[1 of 1 positions shown; findings below may reference images not displayed]

FINDINGS: Patient is rotated. Interval placement of left IJ approach central
venous catheter with distal tip terminating at the level of the
proximal SVC. Stable cardiomegaly. Small right pleural effusion with
associated right basilar opacity. No pneumothorax.
IMPRESSION: Interval placement of left IJ approach central venous catheter with
distal tip terminating at the level of the proximal SVC. No
pneumothorax.

## 2022-06-29 IMAGING — DX DG CHEST 1V
1 series · 1 of 1 positions shown · non-contrast
Comparison: Chest radiograph dated September 05, 2021

CLINICAL DATA: Hypotension.  Chest pain.

EXAM:
CHEST  1 VIEW

[chest ap]
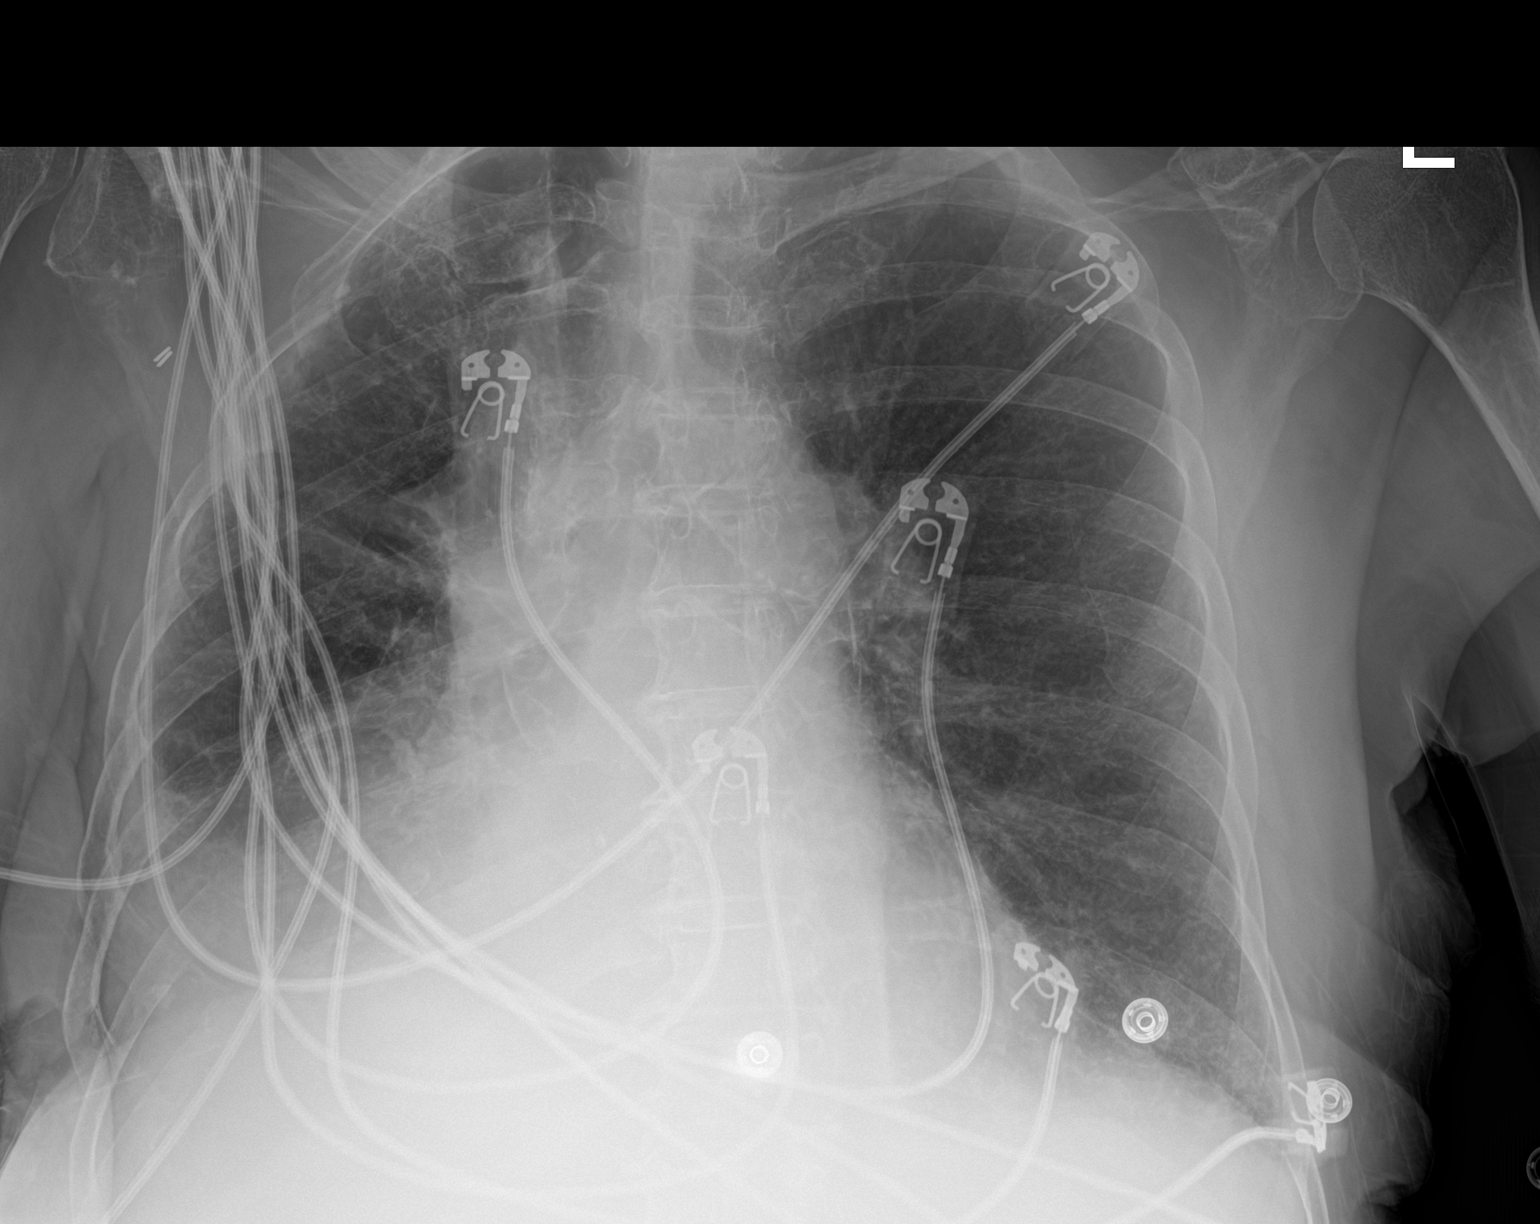

[1 of 1 positions shown; findings below may reference images not displayed]

FINDINGS: The heart is enlarged. There is right lower lobe opacity which may
represent pleural effusion and/or atelectasis. Underlying airspace
disease can not be excluded. Bilateral chronic interstitial lung
markings representing chronic lung disease/fibrosis, unchanged.
IMPRESSION: 1.  Stable cardiomegaly.

2. Large right lower lobe opacity which may represent pleural
effusion and/or atelectasis, underlying airspace disease can not be
excluded.

## 2022-06-29 IMAGING — DX DG ABD PORTABLE 2V
2 series · 2 of 2 positions shown · non-contrast
Comparison: None.

CLINICAL DATA: Pain.  Hypotensive.

EXAM:
PORTABLE ABDOMEN - 2 VIEW

[abdomen erect]
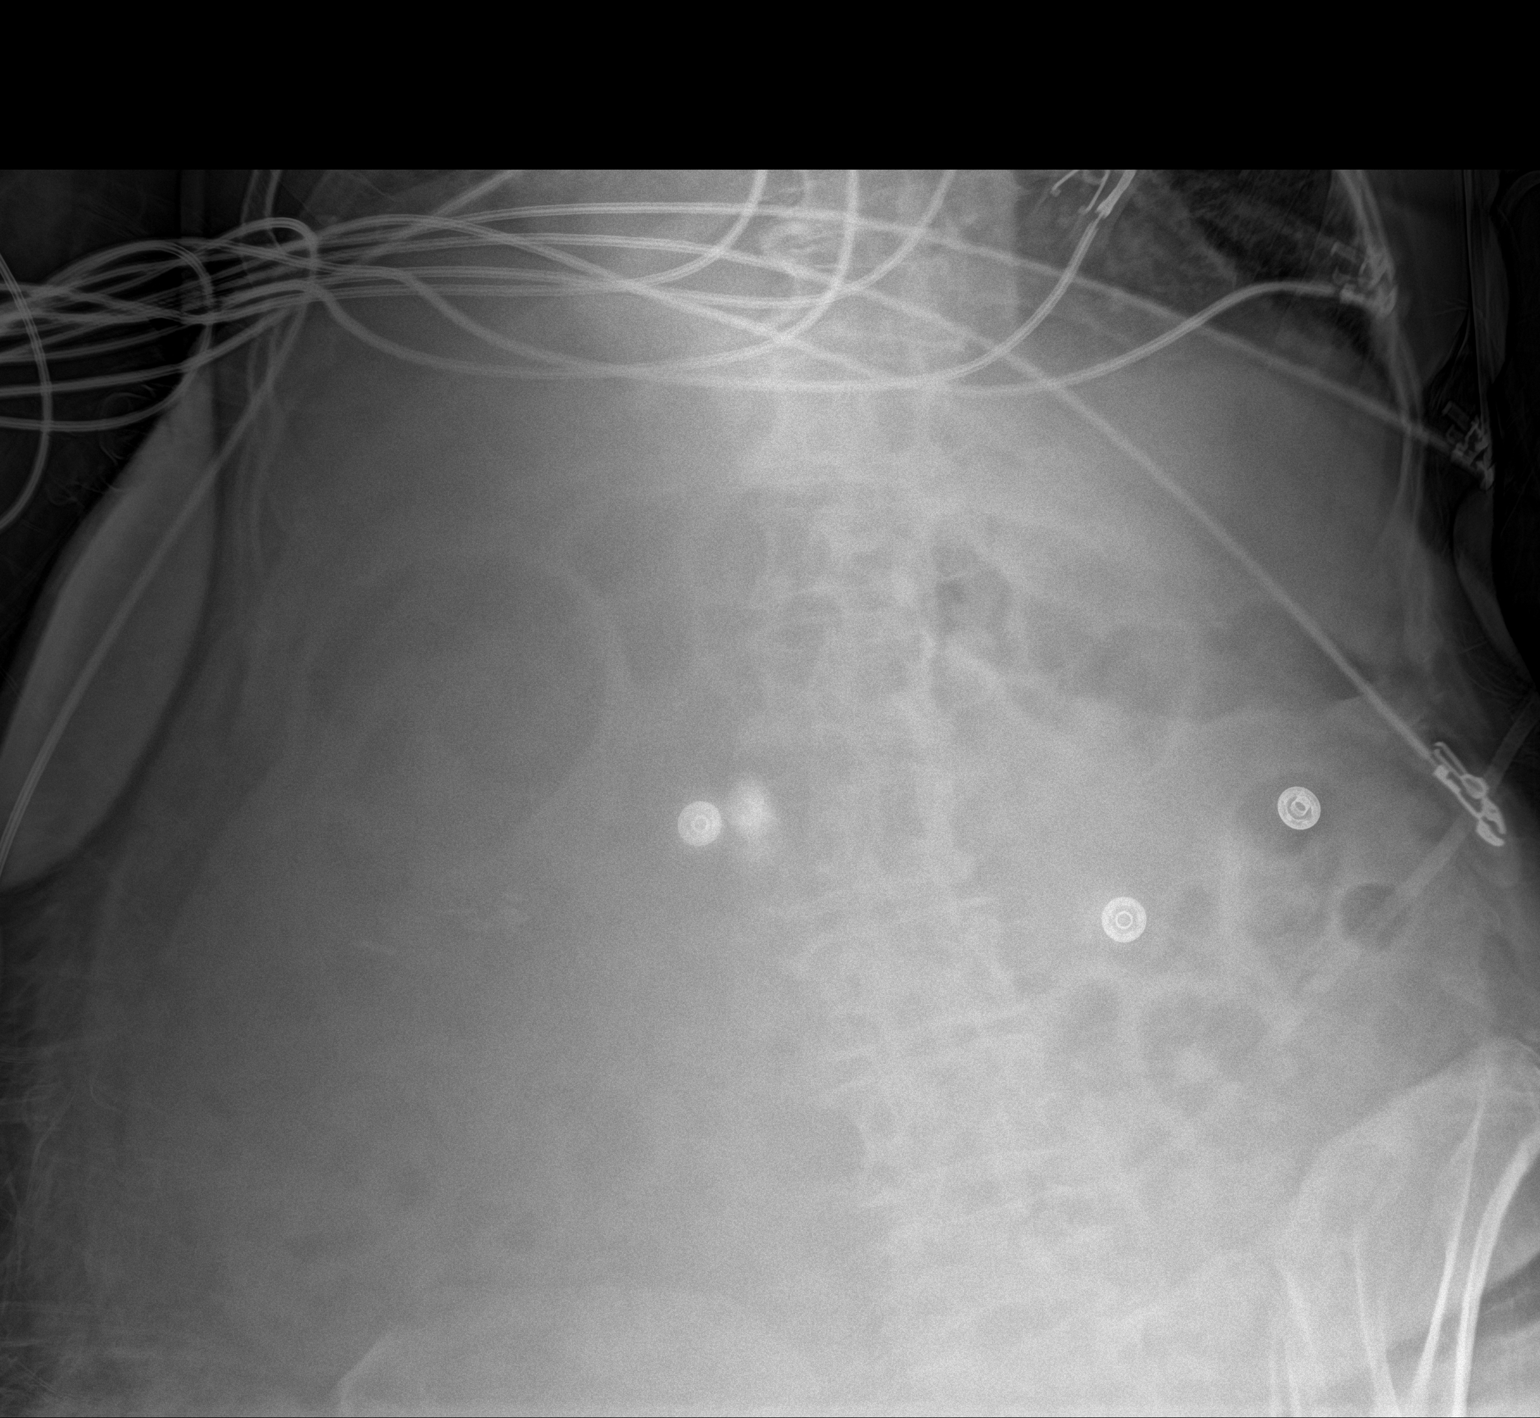

[abdomen supine]
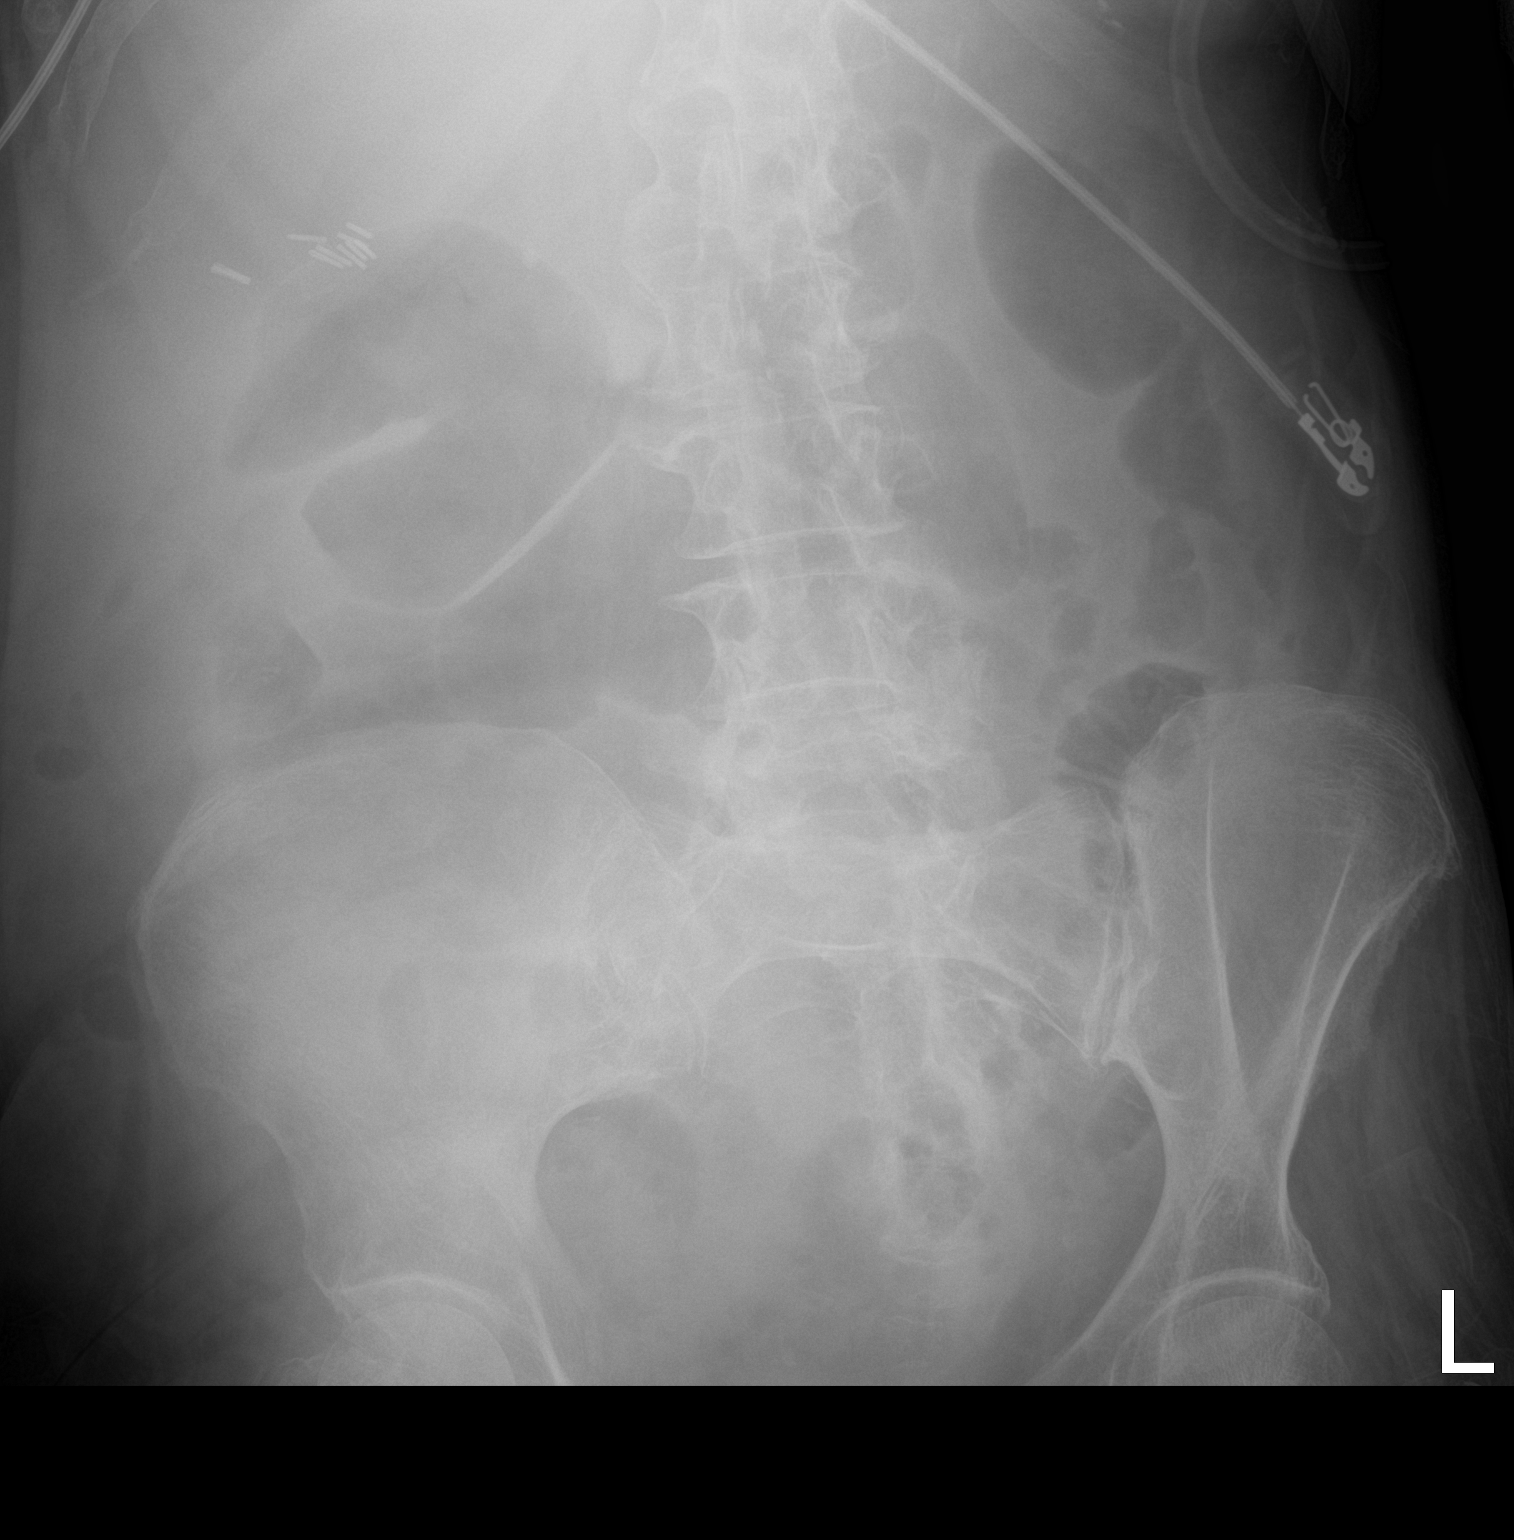

[2 of 2 positions shown; findings below may reference images not displayed]

FINDINGS: No dilated loops of small bowel. Gaseous distension of the right
colon is identified with gas noted up to the level of the rectum.
Cholecystectomy clips. No radio-opaque calculi or other significant
radiographic abnormality is seen.
IMPRESSION: Nonobstructive bowel gas pattern.
